# Patient Record
Sex: Female | Born: 1965
Health system: Southern US, Community
[De-identification: ages and names within clinical notes are randomized; demographics above are authoritative.]

## PROBLEM LIST (undated history)

## (undated) DIAGNOSIS — Z8679 Personal history of other diseases of the circulatory system: Secondary | ICD-10-CM

## (undated) DIAGNOSIS — Q211 Atrial septal defect: Secondary | ICD-10-CM

## (undated) DIAGNOSIS — E785 Hyperlipidemia, unspecified: Secondary | ICD-10-CM

## (undated) DIAGNOSIS — I639 Cerebral infarction, unspecified: Secondary | ICD-10-CM

## (undated) DIAGNOSIS — J45909 Unspecified asthma, uncomplicated: Secondary | ICD-10-CM

## (undated) DIAGNOSIS — K219 Gastro-esophageal reflux disease without esophagitis: Secondary | ICD-10-CM

## (undated) DIAGNOSIS — M778 Other enthesopathies, not elsewhere classified: Secondary | ICD-10-CM

## (undated) DIAGNOSIS — T4145XA Adverse effect of unspecified anesthetic, initial encounter: Secondary | ICD-10-CM

## (undated) DIAGNOSIS — H269 Unspecified cataract: Secondary | ICD-10-CM

## (undated) DIAGNOSIS — Q2112 Patent foramen ovale: Secondary | ICD-10-CM

## (undated) DIAGNOSIS — D649 Anemia, unspecified: Secondary | ICD-10-CM

## (undated) DIAGNOSIS — I10 Essential (primary) hypertension: Secondary | ICD-10-CM

## (undated) DIAGNOSIS — E119 Type 2 diabetes mellitus without complications: Secondary | ICD-10-CM

## (undated) DIAGNOSIS — T8859XA Other complications of anesthesia, initial encounter: Secondary | ICD-10-CM

## (undated) HISTORY — DX: Gastro-esophageal reflux disease without esophagitis: K21.9

## (undated) HISTORY — DX: Unspecified asthma, uncomplicated: J45.909

## (undated) HISTORY — PX: COLONOSCOPY: SHX174

## (undated) HISTORY — DX: Personal history of other diseases of the circulatory system: Z86.79

## (undated) HISTORY — DX: Essential (primary) hypertension: I10

## (undated) HISTORY — PX: UPPER GASTROINTESTINAL ENDOSCOPY: SHX188

## (undated) HISTORY — DX: Hyperlipidemia, unspecified: E78.5

## (undated) HISTORY — DX: Type 2 diabetes mellitus without complications: E11.9

## (undated) HISTORY — DX: Patent foramen ovale: Q21.12

## (undated) HISTORY — PX: OVARIAN CYST REMOVAL: SHX89

## (undated) HISTORY — DX: Anemia, unspecified: D64.9

## (undated) HISTORY — DX: Cerebral infarction, unspecified: I63.9

## (undated) HISTORY — PX: COSMETIC SURGERY: SHX468

## (undated) HISTORY — DX: Unspecified cataract: H26.9

## (undated) HISTORY — DX: Atrial septal defect: Q21.1

---

## 1989-04-11 HISTORY — PX: OVARIAN CYST REMOVAL: SHX89

## 2001-03-07 ENCOUNTER — Emergency Department (HOSPITAL_COMMUNITY): Admission: EM | Admit: 2001-03-07 | Discharge: 2001-03-07 | Payer: Self-pay | Admitting: Emergency Medicine

## 2001-03-07 ENCOUNTER — Encounter: Payer: Self-pay | Admitting: Emergency Medicine

## 2002-12-29 ENCOUNTER — Ambulatory Visit (HOSPITAL_COMMUNITY): Admission: RE | Admit: 2002-12-29 | Discharge: 2002-12-29 | Payer: Self-pay | Admitting: Family Medicine

## 2002-12-29 ENCOUNTER — Encounter: Payer: Self-pay | Admitting: Family Medicine

## 2003-01-23 ENCOUNTER — Encounter: Payer: Self-pay | Admitting: Family Medicine

## 2003-01-23 ENCOUNTER — Ambulatory Visit (HOSPITAL_COMMUNITY): Admission: RE | Admit: 2003-01-23 | Discharge: 2003-01-23 | Payer: Self-pay | Admitting: Family Medicine

## 2006-03-24 ENCOUNTER — Other Ambulatory Visit: Admission: RE | Admit: 2006-03-24 | Discharge: 2006-03-24 | Payer: Self-pay | Admitting: Family Medicine

## 2006-08-11 DIAGNOSIS — I639 Cerebral infarction, unspecified: Secondary | ICD-10-CM

## 2006-08-11 HISTORY — DX: Cerebral infarction, unspecified: I63.9

## 2006-10-22 ENCOUNTER — Encounter: Payer: Self-pay | Admitting: Internal Medicine

## 2006-10-22 ENCOUNTER — Inpatient Hospital Stay (HOSPITAL_COMMUNITY): Admission: EM | Admit: 2006-10-22 | Discharge: 2006-10-27 | Payer: Self-pay | Admitting: Emergency Medicine

## 2006-10-22 ENCOUNTER — Ambulatory Visit: Payer: Self-pay | Admitting: Cardiology

## 2006-10-23 ENCOUNTER — Encounter: Payer: Self-pay | Admitting: Cardiology

## 2006-10-26 ENCOUNTER — Ambulatory Visit: Payer: Self-pay | Admitting: Physical Medicine & Rehabilitation

## 2006-11-06 ENCOUNTER — Encounter: Admission: RE | Admit: 2006-11-06 | Discharge: 2006-12-03 | Payer: Self-pay | Admitting: Neurology

## 2007-03-15 ENCOUNTER — Emergency Department (HOSPITAL_COMMUNITY): Admission: EM | Admit: 2007-03-15 | Discharge: 2007-03-15 | Payer: Self-pay | Admitting: Emergency Medicine

## 2007-04-16 ENCOUNTER — Other Ambulatory Visit: Admission: RE | Admit: 2007-04-16 | Discharge: 2007-04-16 | Payer: Self-pay | Admitting: Family Medicine

## 2008-06-01 ENCOUNTER — Encounter: Payer: Self-pay | Admitting: Internal Medicine

## 2008-10-12 ENCOUNTER — Encounter: Payer: Self-pay | Admitting: Internal Medicine

## 2009-05-09 ENCOUNTER — Other Ambulatory Visit: Admission: RE | Admit: 2009-05-09 | Discharge: 2009-05-09 | Payer: Self-pay | Admitting: Family Medicine

## 2009-12-10 ENCOUNTER — Ambulatory Visit: Payer: Self-pay | Admitting: Internal Medicine

## 2009-12-10 DIAGNOSIS — I10 Essential (primary) hypertension: Secondary | ICD-10-CM | POA: Insufficient documentation

## 2009-12-10 DIAGNOSIS — Z8679 Personal history of other diseases of the circulatory system: Secondary | ICD-10-CM

## 2009-12-10 DIAGNOSIS — J45909 Unspecified asthma, uncomplicated: Secondary | ICD-10-CM

## 2009-12-10 DIAGNOSIS — E785 Hyperlipidemia, unspecified: Secondary | ICD-10-CM

## 2009-12-10 DIAGNOSIS — J4530 Mild persistent asthma, uncomplicated: Secondary | ICD-10-CM | POA: Insufficient documentation

## 2009-12-10 HISTORY — DX: Essential (primary) hypertension: I10

## 2009-12-10 HISTORY — DX: Hyperlipidemia, unspecified: E78.5

## 2009-12-10 HISTORY — DX: Personal history of other diseases of the circulatory system: Z86.79

## 2009-12-10 HISTORY — DX: Unspecified asthma, uncomplicated: J45.909

## 2010-01-18 ENCOUNTER — Ambulatory Visit: Payer: Self-pay | Admitting: Internal Medicine

## 2010-01-18 ENCOUNTER — Telehealth: Payer: Self-pay | Admitting: Internal Medicine

## 2010-01-18 DIAGNOSIS — L723 Sebaceous cyst: Secondary | ICD-10-CM | POA: Insufficient documentation

## 2010-01-28 ENCOUNTER — Telehealth: Payer: Self-pay

## 2010-02-04 ENCOUNTER — Ambulatory Visit: Payer: Self-pay | Admitting: Internal Medicine

## 2010-02-04 LAB — CONVERTED CEMR LAB
ALT: 36 units/L — ABNORMAL HIGH (ref 0–35)
AST: 42 units/L — ABNORMAL HIGH (ref 0–37)
Albumin: 4.1 g/dL (ref 3.5–5.2)
Alkaline Phosphatase: 61 units/L (ref 39–117)
BUN: 17 mg/dL (ref 6–23)
Basophils Absolute: 0 10*3/uL (ref 0.0–0.1)
Basophils Relative: 0.3 % (ref 0.0–3.0)
Bilirubin, Direct: 0.1 mg/dL (ref 0.0–0.3)
CO2: 25 meq/L (ref 19–32)
Calcium: 9 mg/dL (ref 8.4–10.5)
Chloride: 102 meq/L (ref 96–112)
Cholesterol: 125 mg/dL (ref 0–200)
Creatinine, Ser: 1 mg/dL (ref 0.4–1.2)
Eosinophils Absolute: 0.3 10*3/uL (ref 0.0–0.7)
Eosinophils Relative: 4.6 % (ref 0.0–5.0)
GFR calc non Af Amer: 76.45 mL/min (ref >60)
Glucose, Bld: 92 mg/dL (ref 70–99)
HCT: 34.7 % — ABNORMAL LOW (ref 36.0–46.0)
HDL: 45.8 mg/dL (ref >39.00)
Hemoglobin: 12.2 g/dL (ref 12.0–15.0)
LDL Cholesterol: 56 mg/dL (ref 0–99)
Lymphocytes Relative: 12.4 % (ref 12.0–46.0)
Lymphs Abs: 0.8 10*3/uL (ref 0.7–4.0)
MCHC: 35.2 g/dL (ref 30.0–36.0)
MCV: 90.6 fL (ref 78.0–100.0)
Monocytes Absolute: 0.9 10*3/uL (ref 0.1–1.0)
Monocytes Relative: 14.5 % — ABNORMAL HIGH (ref 3.0–12.0)
Neutro Abs: 4.2 10*3/uL (ref 1.4–7.7)
Neutrophils Relative %: 68.2 % (ref 43.0–77.0)
Platelets: 226 10*3/uL (ref 150.0–400.0)
Potassium: 4.1 meq/L (ref 3.5–5.1)
RBC: 3.83 M/uL — ABNORMAL LOW (ref 3.87–5.11)
RDW: 13.6 % (ref 11.5–14.6)
Sodium: 136 meq/L (ref 135–145)
TSH: 2.65 microintl units/mL (ref 0.35–5.50)
Total Bilirubin: 0.5 mg/dL (ref 0.3–1.2)
Total CHOL/HDL Ratio: 3
Total Protein: 7.2 g/dL (ref 6.0–8.3)
Triglycerides: 114 mg/dL (ref 0.0–149.0)
VLDL: 22.8 mg/dL (ref 0.0–40.0)
WBC: 6.2 10*3/uL (ref 4.5–10.5)

## 2010-02-05 ENCOUNTER — Encounter: Payer: Self-pay | Admitting: Internal Medicine

## 2010-02-05 ENCOUNTER — Telehealth: Payer: Self-pay | Admitting: Internal Medicine

## 2010-02-28 ENCOUNTER — Ambulatory Visit: Payer: Self-pay | Admitting: Internal Medicine

## 2010-03-04 ENCOUNTER — Ambulatory Visit: Payer: Self-pay | Admitting: Internal Medicine

## 2010-03-15 ENCOUNTER — Telehealth (INDEPENDENT_AMBULATORY_CARE_PROVIDER_SITE_OTHER): Payer: Self-pay | Admitting: *Deleted

## 2010-05-07 ENCOUNTER — Telehealth: Payer: Self-pay | Admitting: Internal Medicine

## 2010-05-09 ENCOUNTER — Ambulatory Visit: Payer: Self-pay | Admitting: Internal Medicine

## 2010-05-10 ENCOUNTER — Telehealth: Payer: Self-pay | Admitting: Internal Medicine

## 2010-08-27 ENCOUNTER — Ambulatory Visit
Admission: RE | Admit: 2010-08-27 | Discharge: 2010-08-27 | Payer: Self-pay | Source: Home / Self Care | Attending: Internal Medicine | Admitting: Internal Medicine

## 2010-08-27 ENCOUNTER — Encounter: Payer: Self-pay | Admitting: Internal Medicine

## 2010-08-27 DIAGNOSIS — J069 Acute upper respiratory infection, unspecified: Secondary | ICD-10-CM | POA: Insufficient documentation

## 2010-09-10 NOTE — Progress Notes (Signed)
Summary: Pt req another letter to be out of work on 02/06/10`  Phone Note Call from Patient Call back at [facility] Phone [phone]   Caller: Patient Summary of Call: Pt called and wants to know if drinking the nutritional suppliment Boost, would increase patients energy and appetite? Pls advise. Also pt wants to get a letter to be out of work on 02/06/10. Pt says she is still very weak. Pt will pick letter tomorrow.    Initial call taken by: Lucy Antigua,  February 05, 2010 10:39 AM  Follow-up for Phone Call        ok Follow-up by: Gordy Savers  MD,  February 05, 2010 12:53 PM

## 2010-09-10 NOTE — Assessment & Plan Note (Signed)
Summary: elevated BP    kik   Vital Signs:  Patient profile:   45 year old female Weight:      170 pounds Temp:     97.8 degrees F oral BP sitting:   128 / 80  (left arm) Cuff size:   regular  Vitals Entered By: Duard Brady LPN (May 09, 2010 11:03 AM) CC: f/u on elevated BP  Is Patient Diabetic? No Flu Vaccine Consent Questions     Do you have a history of severe allergic reactions to this vaccine? no    Any prior history of allergic reactions to egg and/or gelatin? no    Do you have a sensitivity to the preservative Thimersol? no    Do you have a past history of Guillan-Barre Syndrome? no    Do you currently have an acute febrile illness? no    Have you ever had a severe reaction to latex? no    Vaccine information given and explained to patient? yes    Are you currently pregnant? no    Lot Number:[id number]   Exp Date:02/08/2011   Site Given  Left Deltoid IM   CC:  f/u on elevated BP .  History of Present Illness: 45 year old patient who has a history of treated hypertension in the past.  Presently controlled off medication.  She has a history of a prior right brain stroke in the spring of 2008.  Home blood pressure readings have consistently been elevated over 150 systolic and over 90, diastolic.  She is on pravastatin for dyslipidemia.  She has a history of asthma, which has been stable.  Allergies: 1)  ! Pcn  Past History:  Past Medical History: Reviewed history from 12/10/2009 and no changes required. Asthma Hyperlipidemia Hypertension Cerebrovascular accident, hx of March 2008  Review of Systems  The patient denies anorexia, fever, weight loss, weight gain, vision loss, decreased hearing, hoarseness, chest pain, syncope, dyspnea on exertion, peripheral edema, prolonged cough, headaches, hemoptysis, abdominal pain, melena, hematochezia, severe indigestion/heartburn, hematuria, incontinence, genital sores, muscle weakness, suspicious skin lesions,  transient blindness, difficulty walking, depression, unusual weight change, abnormal bleeding, enlarged lymph nodes, angioedema, and breast masses.    Physical Exam  General:  Well-developed,well-nourished,in no acute distress; alert,appropriate and cooperative throughout examination; 160/94 Eyes:  No corneal or conjunctival inflammation noted. EOMI. Perrla. Funduscopic exam benign, without hemorrhages, exudates or papilledema. Vision grossly normal. Mouth:  Oral mucosa and oropharynx without lesions or exudates.  Teeth in good repair. Neck:  No deformities, masses, or tenderness noted. Lungs:  Normal respiratory effort, chest expands symmetrically. Lungs are clear to auscultation, no crackles or wheezes. Heart:  Normal rate and regular rhythm. S1 and S2 normal without gallop, murmur, click, rub or other extra sounds. Abdomen:  Bowel sounds positive,abdomen soft and non-tender without masses, organomegaly or hernias noted.   Impression & Recommendations:  Problem # 1:  HYPERTENSION (ICD-401.9)  Her updated medication list for this problem includes:    Lisinopril 20 Mg Tabs (Lisinopril) ..... One tablet daily  Problem # 2:  CEREBROVASCULAR ACCIDENT, HX OF (ICD-V12.50)  Complete Medication List: 1)  Pravastatin Sodium 20 Mg Tabs (Pravastatin sodium) .... Qd 2)  Aspir-low 81 Mg Tbec (Aspirin) .... Qd 3)  One-a-day Extras Antioxidant Caps (Multiple vitamins-minerals) .... Qd 4)  Ventolin Hfa 108 (90 Base) Mcg/act Aers (Albuterol sulfate) .... Two puffs every 6 hours and as needed for asthma 5)  Lisinopril 20 Mg Tabs (Lisinopril) .... One tablet daily  Other Orders: Admin 1st Vaccine (  87564) Flu Vaccine 11yrs + (33295)  Patient Instructions: 1)  Limit your Sodium (Salt). 2)  It is important that you exercise regularly at least 20 minutes 5 times a week. If you develop chest pain, have severe difficulty breathing, or feel very tired , stop exercising immediately and seek medical  attention. 3)  You need to lose weight. Consider a lower calorie diet and regular exercise.  4)  Check your Blood Pressure regularly. If it is above: 150/90 you should make an appointment.

## 2010-09-10 NOTE — Letter (Signed)
Summary: Date Range: 06-01-08 to 08-24-08/[facility]  Date Range: 06-01-08 to 08-24-08/[facility]   Imported By: Sherian Rein 01/25/2010 14:20:07  _____________________________________________________________________  External Attachment:    Type:   Image     Comment:   External Document

## 2010-09-10 NOTE — Assessment & Plan Note (Signed)
Summary: low bp 100/64   /cjr   Vital Signs:  Patient profile:   45 year old female Weight:      167 pounds Temp:     98.0 degrees F oral BP sitting:   112 / 70  (left arm) Cuff size:   regular  Vitals Entered By: Duard Brady LPN (March 04, 2010 9:07 AM) CC: c/o BP running loe , weak feeling .     her BP monitor - 115/75 (L) Is Patient Diabetic? No   CC:  c/o BP running loe  and weak feeling .     her BP monitor - 115/75 (L).  History of Present Illness: 45 year old patient who has a history of cerebrovascular disease, and hypertension.  Her blood pressure is presently controlled off medication.  She has been on Septra DS for axillary furunculus as which has largely resolved.  Over the weekend.  She had some weakness and was concerned about low blood pressure readings.  She does monitor  blood pressures at home  Allergies: 1)  ! Pcn  Past History:  Past Medical History: Reviewed history from 12/10/2009 and no changes required. Asthma Hyperlipidemia Hypertension Cerebrovascular accident, hx of March 2008  Physical Exam  General:  Well-developed,well-nourished,in no acute distress; alert,appropriate and cooperative throughout examination; 110/72 Head:  Normocephalic and atraumatic without obvious abnormalities. No apparent alopecia or balding. Eyes:  No corneal or conjunctival inflammation noted. EOMI. Perrla. Funduscopic exam benign, without hemorrhages, exudates or papilledema. Vision grossly normal. Ears:  External ear exam shows no significant lesions or deformities.  Otoscopic examination reveals clear canals, tympanic membranes are intact bilaterally without bulging, retraction, inflammation or discharge. Hearing is grossly normal bilaterally. Mouth:  Oral mucosa and oropharynx without lesions or exudates.  Teeth in good repair. Neck:  No deformities, masses, or tenderness noted. Lungs:  Normal respiratory effort, chest expands symmetrically. Lungs are clear to  auscultation, no crackles or wheezes. Skin:  left axilla revealed no signs of active infection; unshaved   Impression & Recommendations:  Problem # 1:  HYPERTENSION (ICD-401.9)  Problem # 2:  CEREBROVASCULAR ACCIDENT, HX OF (ICD-V12.50)  Problem # 3:  SEBACEOUS CYST, INFECTED (ICD-706.2) will discontinue antibiotic therapy  Complete Medication List: 1)  Pravastatin Sodium 20 Mg Tabs (Pravastatin sodium) .... Qd 2)  Aspir-low 81 Mg Tbec (Aspirin) .... Qd 3)  One-a-day Extras Antioxidant Caps (Multiple vitamins-minerals) .... Qd 4)  Ventolin Hfa 108 (90 Base) Mcg/act Aers (Albuterol sulfate) .... Two puffs every 6 hours and as needed for asthma 5)  Bactrim Ds 800-160 Mg Tabs (Sulfamethoxazole-trimethoprim) .... Take one tab by mouth two times a day until bottle is empty  Patient Instructions: 1)  Limit your Sodium (Salt) to less than 2 grams a day(slightly less than 1/2 a teaspoon) to prevent fluid retention, swelling, or worsening of symptoms. 2)  It is important that you exercise regularly at least 20 minutes 5 times a week. If you develop chest pain, have severe difficulty breathing, or feel very tired , stop exercising immediately and seek medical attention. 3)  Check your Blood Pressure regularly. If it is above: 150/90  you should make an appointment. 4)  Please schedule a follow-up appointment in 6 months.

## 2010-09-10 NOTE — Letter (Signed)
Summary: Out of Work  Adult nurse at [employer]  [address]   [place], [place] [address]   Phone: [phone]  Fax: [phone]    February 05, 2010   Employee:  LEWIS GRIVAS    To Whom It May Concern:   For Medical reasons, please excuse the above named employee from work for the following dates:  Start:   6/27  End:   6/30 if improved  If you need additional information, please feel free to contact our office.         Sincerely,    Gordy Savers  MD

## 2010-09-10 NOTE — Assessment & Plan Note (Signed)
Summary: check ? boil under lft arm/cjr   Vital Signs:  Patient profile:   45 year old female Weight:      164 pounds Temp:     98.5 degrees F oral BP sitting:   100 / 60  (left arm) Cuff size:   regular  Vitals Entered By: Duard Brady LPN (January 18, 2010 11:20 AM) CC: c/o (L) arm pit ??cyst Is Patient Diabetic? No   CC:  c/o (L) arm pit ??cyst.  History of Present Illness: 45 year old patient who is seen today with a painful nodule in the left axilla.  She has had some mild difficulties in the past with infected sebaceous cysts in this area.  Pain is minor and there is been no drainage or any constitutional symptoms.  She has hypertension, dyslipidemia, which have been stable  Allergies: 1)  ! Pcn  Past History:  Past Medical History: Reviewed history from 12/10/2009 and no changes required. Asthma Hyperlipidemia Hypertension Cerebrovascular accident, hx of March 2008  Review of Systems       The patient complains of suspicious skin lesions.  The patient denies anorexia, fever, weight loss, weight gain, vision loss, decreased hearing, hoarseness, chest pain, syncope, dyspnea on exertion, peripheral edema, prolonged cough, headaches, hemoptysis, abdominal pain, melena, hematochezia, severe indigestion/heartburn, hematuria, incontinence, genital sores, muscle weakness, transient blindness, difficulty walking, depression, unusual weight change, abnormal bleeding, enlarged lymph nodes, angioedema, and breast masses.    Physical Exam  General:  overweight-appearing.  low-normal blood pressureoverweight-appearing.   Skin:  1 cm, slightly tender nodule in the central aspect of the left axilla.  No fluctuance.  No drainage.  No adenopathy   Impression & Recommendations:  Problem # 1:  SEBACEOUS CYST, INFECTED (ICD-706.2) local skin care discussed.  Will treat with 7 days of antibiotic therapy  Problem # 2:  HYPERTENSION (ICD-401.9)  Her updated medication list for  this problem includes:    Lisinopril-hydrochlorothiazide 20-12.5 Mg Tabs (Lisinopril-hydrochlorothiazide) ..... Qd  Her updated medication list for this problem includes:    Lisinopril-hydrochlorothiazide 20-12.5 Mg Tabs (Lisinopril-hydrochlorothiazide) ..... Qd  Complete Medication List: 1)  Lisinopril-hydrochlorothiazide 20-12.5 Mg Tabs (Lisinopril-hydrochlorothiazide) .... Qd 2)  Pravastatin Sodium 20 Mg Tabs (Pravastatin sodium) .... Qd 3)  Diphenhydramine Hcl 25 Mg Caps (Diphenhydramine hcl) .... 2 qhs 4)  Aspir-low 81 Mg Tbec (Aspirin) .... Qd 5)  One-a-day Extras Antioxidant Caps (Multiple vitamins-minerals) .... Qd 6)  Ventolin Hfa 108 (90 Base) Mcg/act Aers (Albuterol sulfate) .... Two puffs every 6 hours and as needed for asthma 7)  Cephalexin 500 Mg Caps (Cephalexin) .... One twice daily  Patient Instructions: 1)  Take your antibiotic as prescribed until ALL of it is gone, but stop if you develop a rash or swelling and contact our office as soon as possible. Prescriptions: CEPHALEXIN 500 MG CAPS (CEPHALEXIN) one twice daily  #14 x 0   Entered and Authorized by:   Gordy Savers  MD   Signed by:   Gordy Savers  MD on 01/18/2010   Method used:   Print then Give to Patient   RxID:   [id number]

## 2010-09-10 NOTE — Progress Notes (Signed)
Summary: Pt req diff med that does not contain penicillin  Phone Note Call from Patient Call back at Home Phone [phone]   Caller: Patient Summary of Call: Pt called and said that med that was just perscribed has penicillin in it and pharmacy will not fill. Please call in diff med.   Initial call taken by: Lucy Antigua,  January 18, 2010 1:40 PM  Follow-up for Phone Call        doxycycline 100 mg  #20 one twice daily Follow-up by: Gordy Savers  MD,  January 18, 2010 2:51 PM  Additional Follow-up for Phone Call Additional follow up Details #1::        attempt to call pt to see which pharmacy to call med to. KIK Additional Follow-up by: Duard Brady LPN,  January 18, 2010 3:06 PM    Additional Follow-up for Phone Call Additional follow up Details #2::    Pt called and said that it is the [place] on [facility] [phone] Follow-up by: Lucy Antigua,  January 18, 2010 3:24 PM  New/Updated Medications: DOXYCYCLINE HYCLATE 100 MG CAPS (DOXYCYCLINE HYCLATE) 1 by mouth two times a day for 10 days Prescriptions: DOXYCYCLINE HYCLATE 100 MG CAPS (DOXYCYCLINE HYCLATE) 1 by mouth two times a day for 10 days  #20 x 0   Entered by:   Duard Brady LPN   Authorized by:   Gordy Savers  MD   Signed by:   Duard Brady LPN on 09/81/1914   Method used:   Faxed to ...       [facility]  [address] [facility]       [address]       [place], [place]  [address]       Ph: [phone]       Fax: [phone]   RxID:   [id number]  faxed to [facility] [address]   KIK

## 2010-09-10 NOTE — Progress Notes (Signed)
Summary: elevated BP  Phone Note Call from Patient   Caller: Patient Call For: Rebecca Savers  MD Summary of Call: took BP 148/103 this AM , has been going on for a few days - what to do? Initial call taken by: Duard Brady LPN,  May 07, 2010 2:21 PM  Follow-up for Phone Call        called pt bp now 158/98 . Discussed Salt restrictions, limit caffien, drink water. No headache, no dizziness, no nausea. really does not feel bad. Offered appt today - declined - appt made for thurs per pt request. Instructed that if any chest pain, SOB  to ER.   to Dr. Amador Cunas to advise if there is anything different to do at this time.  Follow-up by: Duard Brady LPN,  May 07, 2010 2:25 PM  Additional Follow-up for Phone Call Additional follow up Details #1::        agree Additional Follow-up by: Rebecca Savers  MD,  May 07, 2010 5:56 PM

## 2010-09-10 NOTE — Progress Notes (Signed)
Summary: mammo ? LMTCB 8-5  Phone Note Call from Patient Call back at [phone]   Summary of Call: [facility] sent her a letter to come for mammogram.  Got one there last year.  Everything was fine.  Irving Burton she go ahead with this?   Initial call taken by: Rudy Jew, RN,  March 15, 2010 12:21 PM  Follow-up for Phone Call        OK to have this year or wait if not high risk for another year Follow-up by: Gordy Savers  MD,  March 15, 2010 12:59 PM  Additional Follow-up for Phone Call Additional follow up Details #1::        Left message to call back on unidentified line. Rudy Jew, RN  March 15, 2010 1:14 PM     Additional Follow-up for Phone Call Additional follow up Details #2::    Pt. notified. Follow-up by: Lynann Beaver CMA,  March 19, 2010 9:46 AM

## 2010-09-10 NOTE — Letter (Signed)
Summary: Records from [facility] 2007 - 2010  Records from [facility] 2007 - 2010   Imported By: Maryln Gottron 01/02/2010 13:02:03  _____________________________________________________________________  External Attachment:    Type:   Image     Comment:   External Document

## 2010-09-10 NOTE — Assessment & Plan Note (Signed)
Summary: BRAND NEW PT/TO EST/CJR---PT [facility]SC / RS---PT [facility] (2ND TIME) // RS   Vital Signs:  Patient profile:   45 year old female Height:      64.5 inches Weight:      167 pounds BMI:     28.32 Temp:     98.2 degrees F oral BP sitting:   122 / 80  (right arm) Cuff size:   regular  Vitals Entered By: Duard Brady LPN (Dec 10, 1608 2:05 PM) CC: new to establish - hx stroke 2008, HTN, high cholesterol    needs medicaltion refills Is Patient Diabetic? No   CC:  new to establish - hx stroke 2008, HTN, and high cholesterol    needs medicaltion refills.  History of Present Illness: 45 year old patient who is seen today to establish with practice.  She was hospitalized in March of 2008 for a right brain stroke with left-sided symptoms.  She has fully recovered.  The stroke occurred several days after starting birth control pills.  She is treated hypertension and dyslipidemia and is on daily aspirin therapy.  No new concerns or complaints today  Preventive Screening-Counseling & Management  Alcohol-Tobacco     Smoking Status: never  Caffeine-Diet-Exercise     Does Patient Exercise: yes  Allergies (verified): 1)  ! Pcn  Past History:  Past Medical History: Asthma Hyperlipidemia Hypertension Cerebrovascular accident, hx of March 2008  Past Surgical History: surgery for ovarian cyst, age 70  Family History: Reviewed history and no changes required. father died in his mid 32s of a massive MI mother, age 47, history of multiple myeloma 4 brothers two sisters positive for hypertension  Also, family history of colon cancer, and prostate cancer  Social History: Reviewed history and no changes required. 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    Physical Exam  General:  well developed, well nourished, in no acute distress Head:  normocephalic and atraumatic Eyes:  PERRLA/EOM intact; fundi benign, conjunctiva and sclera clear Ears:  TM's intact and clear with normal canals and hearing Nose:  no deformity, discharge, inflammation, or lesions Mouth:  no deformity or mucous membrane lesions Neck:  no masses, thyromegaly, or abnormal cervical nodes Chest Wall:  no deformities or breast masses noted Lungs:  clear bilaterally to A & P Heart:  regular rate and rhythm, S1, S2 without murmurs, rubs, gallops, or clicks Abdomen:  bowel sounds positive; abdomen soft and non-tender without masses, organomegaly, or hernias noted Msk:  no deformity or scoliosis noted with normal posture and gait Pulses:  pulses normal in all 4 extremities Extremities:  no clubbing, cyanosis, edema, or deformity noted with normal full range of motion of all joints Neurologic:  no focal deficits, CN II-XII grossly intact with normal reflexes, coordination, muscle stregnth and tone Skin:  intact without lesions or rashes Cervical Nodes:  no significant adenopathy Axillary Nodes:  no significant adenopathy Inguinal Nodes:  no significant adenopathy Psych:  alert and cooperative; normal mood and affect; normal attention span and concentration   Impression & Recommendations:  Problem #  1:  CEREBROVASCULAR ACCIDENT, HX OF (ICD-V12.50)  Problem # 2:  HYPERTENSION (ICD-401.9)  Her updated medication list for this problem includes:    Lisinopril-hydrochlorothiazide 20-12.5 Mg Tabs  (Lisinopril-hydrochlorothiazide) ..... Qd  Problem # 3:  HYPERLIPIDEMIA (ICD-272.4)  Her updated medication list for this problem includes:    Pravastatin Sodium 20 Mg Tabs (Pravastatin sodium) ..... Qd  Problem # 4:  ASTHMA (ICD-493.90)  Her updated medication list for this problem includes:    Ventolin Hfa 108 (90 Base) Mcg/act Aers (Albuterol sulfate) .Marland Kitchen..Marland Kitchen Two puffs every 6 hours and as needed for asthma  Complete Medication List: 1)  Lisinopril-hydrochlorothiazide 20-12.5 Mg Tabs (Lisinopril-hydrochlorothiazide) .... Qd 2)  Pravastatin Sodium 20 Mg Tabs (Pravastatin sodium) .... Qd 3)  Diphenhydramine Hcl 25 Mg Caps (Diphenhydramine hcl) .... 2 qhs 4)  Aspir-low 81 Mg Tbec (Aspirin) .... Qd 5)  One-a-day Extras Antioxidant Caps (Multiple vitamins-minerals) .... Qd 6)  Ventolin Hfa 108 (90 Base) Mcg/act Aers (Albuterol sulfate) .... Two puffs every 6 hours and as needed for asthma  Patient Instructions: 1)  Please schedule a follow-up appointment in 6 months. 2)  Limit your Sodium (Salt). 3)  It is important that you exercise regularly at least 20 minutes 5 times a week. If you develop chest pain, have severe difficulty breathing, or feel very tired , stop exercising immediately and seek medical attention. 4)  You need to lose weight. Consider a lower calorie diet and regular exercise.  5)  Check your Blood Pressure regularly. If it is above: 150/90 you should make an appointment. Prescriptions: VENTOLIN HFA 108 (90 BASE) MCG/ACT AERS (ALBUTEROL SULFATE) two puffs every 6 hours and as needed for asthma  #1 x 6   Entered and Authorized by:   Gordy Savers  MD   Signed by:   Gordy Savers  MD on 12/10/2009   Method used:   Electronically to        [facility] (retail)       [address]       [place], [place]  [address]       Ph: [phone]       Fax: [phone]   RxID:   [id number] PRAVASTATIN SODIUM 20 MG TABS (PRAVASTATIN  SODIUM) qd  #90 x 6   Entered and Authorized by:   Gordy Savers  MD   Signed by:   Gordy Savers  MD on 12/10/2009   Method used:   Electronically to        [facility] (retail)       [address]       [place], [place]  [address]       Ph: [phone]       Fax: [phone]   RxID:   [id number] LISINOPRIL-HYDROCHLOROTHIAZIDE 20-12.5 MG TABS (LISINOPRIL-HYDROCHLOROTHIAZIDE) qd  #90 x 6   Entered and Authorized by:   Gordy Savers  MD   Signed by:   Gordy Savers  MD on 12/10/2009   Method used:   Electronically to        [facility] (retail)       [address]       [place], [place]  [address]       Ph: [phone]       Fax: [phone]   RxID:  [id number] VENTOLIN HFA 108 (90 BASE) MCG/ACT AERS (ALBUTEROL SULFATE) two puffs every 6 hours and as needed for asthma  #1 x 6   Entered and Authorized by:   Gordy Savers  MD   Signed by:   Gordy Savers  MD on 12/10/2009   Method used:   Print then Give to Patient   RxID:   [id number] PRAVASTATIN SODIUM 20 MG TABS (PRAVASTATIN SODIUM) qd  #90 x 6   Entered and Authorized by:   Gordy Savers  MD   Signed by:   Gordy Savers  MD on 12/10/2009   Method used:   Print then Give to Patient   RxID:   [id number] LISINOPRIL-HYDROCHLOROTHIAZIDE 20-12.5 MG TABS (LISINOPRIL-HYDROCHLOROTHIAZIDE) qd  #90 x 6   Entered and Authorized by:   Gordy Savers  MD   Signed by:   Gordy Savers  MD on 12/10/2009   Method used:   Print then Give to Patient   RxID:   [id number]

## 2010-09-10 NOTE — Letter (Signed)
Summary: [facility]   Imported By: Sherian Rein 01/25/2010 14:14:32  _____________________________________________________________________  External Attachment:    Type:   Image     Comment:   External Document

## 2010-09-10 NOTE — Assessment & Plan Note (Signed)
Summary: having chills on/off//ccm   Vital Signs:  Patient profile:   45 year old female Weight:      163 pounds Temp:     98.3 degrees F oral BP sitting:   98 / 64  (left arm) Cuff size:   regular  Vitals Entered By: Duard Brady LPN (February 04, 2010 11:22 AM) CC: cyst still there, little improvment ,  had episode of urine incont. this weekend - also was disoriented -EMS was called. Is Patient Diabetic? No   CC:  cyst still there, little improvment , and had episode of urine incont. this weekend - also was disoriented -EMS was called.Marland Kitchen  History of Present Illness: 45 year old patient who is seen today for follow up.  She has treated hypertension.  Over the weekend.  She has had some diarrhea.  EMS was called due to weakness disorientation.  She has had a single episode of incontinence.  She has been on antibiotic therapy for a skin and soft tissue infection.  She has a history of asthma, which has been stable  Allergies: 1)  ! Pcn  Past History:  Past Medical History: Reviewed history from 12/10/2009 and no changes required. Asthma Hyperlipidemia Hypertension Cerebrovascular accident, hx of March 2008  Review of Systems       The patient complains of anorexia, muscle weakness, and suspicious skin lesions.  The patient denies fever, weight loss, weight gain, vision loss, decreased hearing, hoarseness, chest pain, syncope, dyspnea on exertion, peripheral edema, prolonged cough, headaches, hemoptysis, abdominal pain, melena, hematochezia, severe indigestion/heartburn, hematuria, incontinence, genital sores, transient blindness, difficulty walking, depression, unusual weight change, abnormal bleeding, enlarged lymph nodes, angioedema, and breast masses.    Physical Exam  General:  overweight-appearing.  appears slightly weak, but alert and appropriate.  Blood pressure 98/64overweight-appearing.   Head:  Normocephalic and atraumatic without obvious abnormalities. No apparent  alopecia or balding. Eyes:  No corneal or conjunctival inflammation noted. EOMI. Perrla. Funduscopic exam benign, without hemorrhages, exudates or papilledema. Vision grossly normal. Mouth:  Oral mucosa and oropharynx without lesions or exudates.  Teeth in good repair. Neck:  No deformities, masses, or tenderness noted. Lungs:  Normal respiratory effort, chest expands symmetrically. Lungs are clear to auscultation, no crackles or wheezes. Heart:  Normal rate and regular rhythm. S1 and S2 normal without gallop, murmur, click, rub or other extra sounds. Abdomen:  Bowel sounds positive,abdomen soft and non-tender without masses, organomegaly or hernias noted. Msk:  No deformity or scoliosis noted of thoracic or lumbar spine.   Pulses:  R and L carotid,radial,femoral,dorsalis pedis and posterior tibial pulses are full and equal bilaterally Extremities:  No clubbing, cyanosis, edema, or deformity noted with normal full range of motion of all joints.     Impression & Recommendations:  Problem # 1:  SEBACEOUS CYST, INFECTED (ICD-706.2)  Problem # 2:  HYPERTENSION (ICD-401.9)  The following medications were removed from the medication list:    Lisinopril-hydrochlorothiazide 20-12.5 Mg Tabs (Lisinopril-hydrochlorothiazide) ..... Qd patient has symptomatic hypotension, will discontinue her blood pressure medication.  Force fluids and recheck her blood pressure in two weeks    The following medications were removed from the medication list:    Lisinopril-hydrochlorothiazide 20-12.5 Mg Tabs (Lisinopril-hydrochlorothiazide) ..... Qd  Complete Medication List: 1)  Pravastatin Sodium 20 Mg Tabs (Pravastatin sodium) .... Qd 2)  Aspir-low 81 Mg Tbec (Aspirin) .... Qd 3)  One-a-day Extras Antioxidant Caps (Multiple vitamins-minerals) .... Qd 4)  Ventolin Hfa 108 (90 Base) Mcg/act Aers (Albuterol sulfate) .... Two  puffs every 6 hours and as needed for asthma 5)  Bactrim Ds 800-160 Mg Tabs  (Sulfamethoxazole-trimethoprim) .... Take one tab by mouth two times a day until bottle is empty  Other Orders: Venipuncture ([address]) TLB-Lipid Panel (80061-LIPID) TLB-BMP (Basic Metabolic Panel-BMET) (80048-METABOL) TLB-CBC Platelet - w/Differential (85025-CBCD) TLB-TSH (Thyroid Stimulating Hormone) (84443-TSH) TLB-Hepatic/Liver Function Pnl (80076-HEPATIC)  Patient Instructions: 1)  Please schedule a follow-up appointment in 2 weeks. 2)  Drink as much fluid as you can tolerate for the next few days. 3)  stop the blood pressure medication

## 2010-09-10 NOTE — Letter (Signed)
Summary: Pt discharge insturcions/[facility]   Pt discharge insturcions/[facility]   Imported By: Sherian Rein 01/25/2010 14:16:56  _____________________________________________________________________  External Attachment:    Type:   Image     Comment:   External Document

## 2010-09-10 NOTE — Letter (Signed)
Summary: Out of Work  Adult nurse at [employer]  [address]   [place], [place] [address]   Phone: [phone]  Fax: [phone]    February 04, 2010   Employee:  INEZ STANTZ    To Whom It May Concern:   For Medical reasons, please excuse the above named employee from work for the following dates:  Start:   02-04-2010  End:   02-06-2010 if improved  If you need additional information, please feel free to contact our office.         Sincerely,    Gordy Savers  MD

## 2010-09-10 NOTE — Progress Notes (Signed)
Summary: Pt still has cyst and is out of antibiotic  Phone Note Call from Patient Call back at Home Phone [phone]   Caller: Patient Summary of Call: Pt called and said that cyst still hasnt gone down any and she has finished antibiotic. Pt doesn't know if she needs to come in to be re-evaluated or if more med can be called in. [facility] [phone].  Pt req call from [facility] or Dr. Amador Cunas.  Initial call taken by: Lucy Antigua,  January 28, 2010 11:49 AM  Follow-up for Phone Call        generic bactrim DS  #30 one twice daily Follow-up by: Gordy Savers  MD,  January 28, 2010 12:23 PM     Appended Document: Pt still has cyst and is out of antibiotic    Clinical Lists Changes  Medications: Added new medication of BACTRIM DS 800-160 MG TABS (SULFAMETHOXAZOLE-TRIMETHOPRIM) take one tab by mouth two times a day until bottle is empty - Signed Rx of BACTRIM DS 800-160 MG TABS (SULFAMETHOXAZOLE-TRIMETHOPRIM) take one tab by mouth two times a day until bottle is empty;  #30 x 0;  Signed;  Entered by: Kern Reap CMA (AAMA);  Authorized by: Gordy Savers  MD;  Method used: Electronically to [facility] [address], [place], [place], [place]  [address], Ph: [phone], Fax: [phone]    Prescriptions: BACTRIM DS 800-160 MG TABS (SULFAMETHOXAZOLE-TRIMETHOPRIM) take one tab by mouth two times a day until bottle is empty  #30 x 0   Entered by:   Kern Reap CMA (AAMA)   Authorized by:   Gordy Savers  MD   Signed by:   Kern Reap CMA (AAMA) on 01/28/2010   Method used:   Electronically to        [facility] (retail)       [address]       [place], [place]  [address]       Ph: [phone]       Fax: [phone]   RxID:   [id number]    Appended Document: Pt still has cyst and is out of antibiotic patient is aware

## 2010-09-10 NOTE — Progress Notes (Signed)
Summary: BP med to start?  Phone Note Call from Patient   Caller: Patient Call For: Gordy Savers  MD Summary of Call: at appt yesterday , pt thought she was to start on BP meds - checked with pharmacy - nothing called in .  please call [phone] Initial call taken by: Duard Brady LPN,  May 10, 2010 12:15 PM    Prescriptions: LISINOPRIL 20 MG TABS (LISINOPRIL) one tablet daily  #90 x 6   Entered and Authorized by:   Gordy Savers  MD   Signed by:   Gordy Savers  MD on 05/10/2010   Method used:   Electronically to        [facility] (retail)       [address]       [place], [place]  [address]       Ph: [phone]       Fax: [phone]   RxID:   [id number]

## 2010-09-10 NOTE — Assessment & Plan Note (Signed)
Summary: 2 wk rov/njr/pt rsc/cjr   Vital Signs:  Patient profile:   45 year old female Weight:      162 pounds Temp:     98.0 degrees F oral BP sitting:   120 / 78  (right arm) Cuff size:   regular  Vitals Entered By: Duard Brady LPN (February 28, 2010 11:02 AM) CC: 2 wk rov - doing ok , still has cyst under (L) arm Is Patient Diabetic? No   CC:  2 wk rov - doing ok  and still has cyst under (L) arm.  History of Present Illness: 45 year old patient who is seen today for follow-up of her hypertension.  Blood pressure medications.  Has been on hold since an episode of volume  depletion and symptomatic hypotension.  She feels well.  She continues to monitor home blood pressure readings with only occasional elevated readings.  She feels well.  She has a history of asthma, which has been stable, as well as dyslipidemia  Allergies: 1)  ! Pcn  Past History:  Past Medical History: Reviewed history from 12/10/2009 and no changes required. Asthma Hyperlipidemia Hypertension Cerebrovascular accident, hx of March 2008  Review of Systems  The patient denies anorexia, fever, weight loss, weight gain, vision loss, decreased hearing, hoarseness, chest pain, syncope, dyspnea on exertion, peripheral edema, prolonged cough, headaches, hemoptysis, abdominal pain, melena, hematochezia, severe indigestion/heartburn, hematuria, incontinence, genital sores, muscle weakness, suspicious skin lesions, transient blindness, difficulty walking, depression, unusual weight change, abnormal bleeding, enlarged lymph nodes, angioedema, and breast masses.    Physical Exam  General:  Well-developed,well-nourished,in no acute distress; alert,appropriate and cooperative throughout examination; 124/82 in Mouth:  Oral mucosa and oropharynx without lesions or exudates.  Teeth in good repair. Neck:  No deformities, masses, or tenderness noted. Lungs:  Normal respiratory effort, chest expands symmetrically. Lungs  are clear to auscultation, no crackles or wheezes. Heart:  Normal rate and regular rhythm. S1 and S2 normal without gallop, murmur, click, rub or other extra sounds. Abdomen:  Bowel sounds positive,abdomen soft and non-tender without masses, organomegaly or hernias noted.   Impression & Recommendations:  Problem # 1:  HYPERTENSION (ICD-401.9) will continue to observe off blood pressure medication and continued home close blood pressure monitoring. If  blood pressures consistently above 140/90.  Will resume treatment  Problem # 2:  ASTHMA (ICD-493.90)  Her updated medication list for this problem includes:    Ventolin Hfa 108 (90 Base) Mcg/act Aers (Albuterol sulfate) .Marland Kitchen..Marland Kitchen Two puffs every 6 hours and as needed for asthma  Complete Medication List: 1)  Pravastatin Sodium 20 Mg Tabs (Pravastatin sodium) .... Qd 2)  Aspir-low 81 Mg Tbec (Aspirin) .... Qd 3)  One-a-day Extras Antioxidant Caps (Multiple vitamins-minerals) .... Qd 4)  Ventolin Hfa 108 (90 Base) Mcg/act Aers (Albuterol sulfate) .... Two puffs every 6 hours and as needed for asthma 5)  Bactrim Ds 800-160 Mg Tabs (Sulfamethoxazole-trimethoprim) .... Take one tab by mouth two times a day until bottle is empty  Patient Instructions: 1)  Limit your Sodium (Salt) to less than 2 grams a day(slightly less than 1/2 a teaspoon) to prevent fluid retention, swelling, or worsening of symptoms. 2)  It is important that you exercise regularly at least 20 minutes 5 times a week. If you develop chest pain, have severe difficulty breathing, or feel very tired , stop exercising immediately and seek medical attention. 3)  Check your Blood Pressure regularly. If it is above: 140/90  you should make an appointment. 4)  Please schedule a follow-up appointment in 4 months.

## 2010-09-12 NOTE — Assessment & Plan Note (Signed)
Summary: cough/head and chest congestion/cjr/pt rescd//ccm   Vital Signs:  Patient profile:   45 year old female Weight:      166 pounds Temp:     98.0 degrees F oral BP sitting:   122 / 80  (right arm) Cuff size:   regular  Vitals Entered By: Duard Brady LPN (August 27, 2010 10:15 AM) CC: c/o head and chest congestion , non productive cough   otc not helping Is Patient Diabetic? No   CC:  c/o head and chest congestion  and non productive cough   otc not helping.  History of Present Illness: 45  year old patient who has a history of asthma and presents with a4-day history of head and chest congestion.  Her primary symptom is chronic, nonproductive cough.  There has been no wheezing, but she has taken her Ventolin MDI, prophylactically, occasionally.  She has been using Alka-Seltzer, cough and cold Delsym and Mucinex DM.  There is been no fever or purulent sputum production.  She does have a history of dyslipidemia and hypertension  Allergies: 1)  ! Pcn  Past History:  Past Medical History: Reviewed history from 12/10/2009 and no changes required. Asthma Hyperlipidemia Hypertension Cerebrovascular accident, hx of March 2008  Review of Systems       The patient complains of anorexia and prolonged cough.  The patient denies fever, weight loss, weight gain, vision loss, decreased hearing, hoarseness, chest pain, syncope, dyspnea on exertion, peripheral edema, headaches, hemoptysis, abdominal pain, melena, hematochezia, severe indigestion/heartburn, hematuria, incontinence, genital sores, muscle weakness, suspicious skin lesions, transient blindness, difficulty walking, depression, unusual weight change, abnormal bleeding, enlarged lymph nodes, angioedema, and breast masses.    Physical Exam  General:  Well-developed,well-nourished,in no acute distress; alert,appropriate and cooperative throughout examination Head:  Normocephalic and atraumatic without obvious  abnormalities. No apparent alopecia or balding. Eyes:  No corneal or conjunctival inflammation noted. EOMI. Perrla. Funduscopic exam benign, without hemorrhages, exudates or papilledema. Vision grossly normal. Ears:  External ear exam shows no significant lesions or deformities.  Otoscopic examination reveals clear canals, tympanic membranes are intact bilaterally without bulging, retraction, inflammation or discharge. Hearing is grossly normal bilaterally. Nose:  External nasal examination shows no deformity or inflammation. Nasal mucosa are pink and moist without lesions or exudates. Mouth:  Oral mucosa and oropharynx without lesions or exudates.  Teeth in good repair. Neck:  No deformities, masses, or tenderness noted. Lungs:  Normal respiratory effort, chest expands symmetrically. Lungs are clear to auscultation, no crackles or wheezes. Heart:  Normal rate and regular rhythm. S1 and S2 normal without gallop, murmur, click, rub or other extra sounds.   Impression & Recommendations:  Problem # 1:  URI (ICD-465.9)  Her updated medication list for this problem includes:    Aspir-low 81 Mg Tbec (Aspirin) ..... Qd    Hydrocodone-homatropine 5-1.5 Mg/50ml Syrp (Hydrocodone-homatropine) .Marland Kitchen... 1 teaspoon every 6 hours as needed for cough  Her updated medication list for this problem includes:    Aspir-low 81 Mg Tbec (Aspirin) ..... Qd    Hydrocodone-homatropine 5-1.5 Mg/40ml Syrp (Hydrocodone-homatropine) .Marland Kitchen... 1 teaspoon every 6 hours as needed for cough  Problem # 2:  HYPERTENSION (ICD-401.9)  Her updated medication list for this problem includes:    Lisinopril 20 Mg Tabs (Lisinopril) ..... One tablet daily  Her updated medication list for this problem includes:    Lisinopril 20 Mg Tabs (Lisinopril) ..... One tablet daily  Complete Medication List: 1)  Pravastatin Sodium 20 Mg Tabs (Pravastatin sodium) .Marland KitchenMarland KitchenMarland Kitchen  Qd 2)  Aspir-low 81 Mg Tbec (Aspirin) .... Qd 3)  One-a-day Extras Antioxidant  Caps (Multiple vitamins-minerals) .... Qd 4)  Ventolin Hfa 108 (90 Base) Mcg/act Aers (Albuterol sulfate) .... Two puffs every 6 hours and as needed for asthma 5)  Lisinopril 20 Mg Tabs (Lisinopril) .... One tablet daily 6)  Hydrocodone-homatropine 5-1.5 Mg/28ml Syrp (Hydrocodone-homatropine) .Marland Kitchen.. 1 teaspoon every 6 hours as needed for cough  Patient Instructions: 1)  Please schedule a follow-up appointment in 6 months. 2)  Limit your Sodium (Salt). 3)  Get plenty of rest, drink lots of clear liquids, and use Tylenol or Ibuprofen for fever and comfort. Return in 7-10 days if you're not better:sooner if you're feeling worse. Prescriptions: HYDROCODONE-HOMATROPINE 5-1.5 MG/5ML SYRP (HYDROCODONE-HOMATROPINE) 1 teaspoon every 6 hours as needed for cough  #6 oz x 0   Entered and Authorized by:   Gordy Savers  MD   Signed by:   Gordy Savers  MD on 08/27/2010   Method used:   Print then Give to Patient   RxID:   [id number]    Orders Added: 1)  Est. Patient Level III [30865]

## 2010-09-12 NOTE — Letter (Signed)
Summary: Out of Work  Adult nurse at [employer]  [address]   [place], [place] [address]   Phone: [phone]  Fax: [phone]    August 27, 2010   Employee:  ANNISHA BAAR    To Whom It May Concern:   For Medical reasons, please excuse the above named employee from work for the following dates:  Start:   08-27-10  End:   08-28-10   If you need additional information, please feel free to contact our office.         Sincerely,    Gordy Savers  MD

## 2010-12-27 NOTE — H&P (Signed)
NAMECASS, EDINGER NO.:  [id number]   MEDICAL RECORD NO.:  [id number]          PATIENT TYPE:  INP   LOCATION:  3005                         FACILITY:  [facility]   PHYSICIAN:  Deanna Artis. Hickling, M.D.DATE OF BIRTH:  [birth year]   DATE OF ADMISSION:  10/22/2006  DATE OF DISCHARGE:                              HISTORY & PHYSICAL   CHIEF COMPLAINT:  Left-sided weakness and garbled speech.   HISTORY OF PRESENT ILLNESS:  A 45 year old, right-handed, African-  American woman with history of developmental delay and slurred speech,  fully independent in her life with a history of gastroenteritis with  diarrhea yesterday.  The patient was seen by Dr. Abigail Miyamoto and sent home  with recommendations to rest and drink plenty of fluids.  The patient  went to bed at 10:30 and was not seen again until 3:10 p.m..  The  patient says she became weak around 11 a.m. and tried to call her  sister, but was unsuccessful and kept hanging.  This created a clicking  sound on the phone which her sister remembers at between 11 and 11:30.  She was not aware of its significant until she returned home.  When she  arrived at home, the patient had severe garbled speech and left  hemiparesis involving her arm and leg.  She was unable to walk.   The patient arrived by ambulance at [facility] is 1554 hours.  The ER  doctor was unable to obtain the history noted above and believed the  last time known normal was 10:30 p.m.  Therefore, code Stroke was not  called.  CT scan of the brain was delayed until 1751 hours and was  normal.  MRI of the brain was ordered and carried out around 1939 hours.  It showed evidence of central stroke involving cortical and subcortical  and basal ganglia structures.  MRA showed a cut off of the superior  branch of the right middle cerebral artery.  I happened to be and the MR  scanner, seeing another patient, when the technologist made me aware of  this.  I went to see her  at around 2015 hours.  NIH stroke scale at that  time was 5, modified rank and premorbid 0.   PAST MEDICAL HISTORY:  1. Hypertension.  2. Asthma.  3. Gastroesophageal reflux disease.  4. Dysmenorrhea.   PAST SURGICAL HISTORY:  None.   BIRTH HISTORY:  Normal pregnancy, labor and delivery, 5 pound 11 ounce  infant born to a G7, P6, full term.  The child was delayed in  kindergarten and has a certificate from [place].   SOCIAL HISTORY:  The patient lives with her sister.  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  She is  independent in her activities of daily living.  She does not use  tobacco.  She rarely drinks alcohol.  She denies use of drugs.   FAMILY HISTORY:  Positive for stroke in maternal grandmother in her mid  37s.  No other pertinent family history.   MEDICATIONS:  Lisinopril, Singulair, Protonix, oral contraceptives  (  doses are unknown).   ALLERGIES:  PENICILLIN (hives).   PHYSICAL EXAMINATION:  VITAL SIGNS:  Blood pressure 104/67 resting pulse  84, respirations 14, temperature 97.6, oxygen saturation 100%.  HEENT:  No bruits, meningismus.  LUNGS:  Clear.  HEART:  No murmurs.  Pulses normal.  ABDOMEN:  Soft.  Bowel sounds normal.  Protuberant abdomen.  No  hepatosplenomegaly.  EXTREMITIES:  Normal.  NEUROLOGIC:  Mental status:  The patient's speech was garbled,  intelligible at times.  The patient names objects, repeats phrases,  follows commands.  Cranial nerves with round reactive pupils.  Visual  fields full.  Extraocular movements full.  Left central VIII.  Midline  tongue and uvula.  Air conduction greater than bone conduction.  Motor  examination normal strength on the right with no drift and good fine  motor movements on the left with 4/5 deltoid, 4+ biceps and triceps,  wrist extensor 3, wrist flexor 4, grip 4+, fine motor movements clumsy,  hip flexor 4, psoas 4+, knee flexor 4, knee extensor 4+, foot  dorsiflexor 4,  plantar flexor 5.  Sensory:  No hemisensory, good  stereognosis bilaterally.  Cerebellar finger-to-nose and heel-knee-shin  were normal on the right.  Normal for her weakness on the left with heel-  knee-shin.  Could not be tested on the left arm.  Gait was not tested.  Deep tendon reflexes were diminished.  The patient had bilateral flexor  plantar responses.   IMPRESSION:  On the basis of the study, the patient has a branch  occlusion of the superior branch of the right middle cerebral artery  likely an embolus, but the source is unclear.  This is a nonhemorrhagic  infarction. 434.10   PLAN:  The patient is beyond the TPA 3-hour window.  At presentation,  she was at 5 hours.  I would not have had her go to interventional  radiology due to the  time of presentation, and the presence of the  branch occlusion.  I did not become aware of this patient until 9 hours  into her stroke.  She is not a candidate for any neurologic protective  study at this time because of low NIH stroke scale.  She could worsen.  We will watch her very closely.  She passed her swallowing screen.  She  will be admitted for workup for treatable causes of a stroke in the  young. She will be treated with aspirin.      Deanna Artis. Sharene Skeans, M.D.  Electronically Signed     [facility]/MED[facility]  D:  10/22/2006  T:  10/24/2006  Job:  [id number]   cc:   Chales Salmon. Abigail Miyamoto, M.D.

## 2010-12-27 NOTE — Discharge Summary (Signed)
NAMEAAIRAH, NEGRETTE NO.:  [id number]   MEDICAL RECORD NO.:  [id number]          PATIENT TYPE:  INP   LOCATION:  3005                         FACILITY:  [facility]   PHYSICIAN:  Melvyn Novas, M.D.  DATE OF BIRTH:  Dec 06, 1965   DATE OF ADMISSION:  10/22/2006  DATE OF DISCHARGE:  10/25/2006                         DISCHARGE SUMMARY - REFERRING   HISTORY OF PRESENT ILLNESS:  Mrs. Rebecca Yoder was admitted on October 22, 2006 to the [facility] Stroke MD Service. She is a 45 -year-  old African-American right handed lady, who had noted left sided  weakness and garbled speech. She was found by her sister, with whom she  lives at home. Her sister had been out for a night shift and returned  from work, finding Mrs. Brimley on the floor. Mrs. Feehan had tried to  reach the telephone but was unsuccessful. She was unable to walk and  could only crawl. The patient arrived by ambulance at [facility] at close to 16 hours. A code stroke was not called. Please see  details of history and physical. The [employer] [facility] [employer] Stroke  Scale at the time was 5, modified Rankin Disability Scale was zero.   PAST MEDICAL HISTORY:  Hypertension, asthma, gastroesophageal reflux  disease, and dysmenorrhea.   PAST SURGICAL HISTORY:  None.   OB/GYN HISTORY:  The patient was the product of a normal labor, born at  5 pounds and 11 ounces. She was delayed in kindergarten and has a high  school certificate from [place].   HOSPITAL COURSE:  The patient was placed on Lovenox. She had a MRI that  showed a branch occlusion of the right MCA with a question of an embolic  source, given the use of her patient and her in general, good vascular  status. She passed a swallowing screen and was able to take p.o.  medications. She was not a candidate for any NEURO protective studies  because of the elapsed time before she presented to ER.   The patient was undergoing a 2-D  echocardiogram, which shows no source  of embolism.  She remained impaired with left sided hemiparesis at 3 out of 5 for the  deltoid, 4+ out of 5 of the biceps and triceps, and very clumsy hand  movements. Hip flexion, hip abduction, and adduction were in the 4 out  of 5 range.  The patient is able to walk with assistance. She can use a bedside  commode.   CONCLUSION:  The patient remains with a rather mild hemiparesis.  She is supposed to be discharged to the inpatient rehabilitation  services at [facility] today. She remains on ASA 325 mg per  day.      Melvyn Novas, M.D.  Electronically Signed     CD/MEDQ  D:  10/25/2006  T:  10/25/2006  Job:  [id number]   cc:   Chales Salmon. Abigail Miyamoto, M.D.

## 2010-12-27 NOTE — Discharge Summary (Signed)
[name], KERNODLE NO.:  [id number]   MEDICAL RECORD NO.:  [id number]          PATIENT TYPE:  INP   LOCATION:  3005                         FACILITY:  [facility]   PHYSICIAN:  Annie Main, N.P.      DATE OF BIRTH:  [birth year]   DATE OF ADMISSION:  10/22/2006  DATE OF DISCHARGE:                               DISCHARGE SUMMARY   ADDENDUM:  Transesophageal echocardiogram was performed on [name] to rule out embolic source.  She does have a very small PFO  without right to left shunt.  It is unlikely to be the etiology of her  infarct.  Etiology overall is unclear, though it is highly coincidental  that she started birth control pills and had a total of 9 days of birth  control pills prior to the infarct.  I have asked the patient to stop  birth control pills at the time of discharge.  I placed her on aspirin  325 mg for secondary stroke prevention.  The patient is too high level  for inpatient rehabilitation and has to be arranged for outpatient PT  and OT at the time of discharge.   She will follow up with Dr. Vickey Huger in 2-3 months and will get an  outpatient bubble study with Dr. Pearlean Brownie in the meanwhile.  She has been  advised that she can pick up a handicap placard at Dr. Bethann Goo office  prior to her next appointment.  Hemoglobin electrophoresis was performed  to evaluate for sickle cell traits.  This is pending at the time of  discharge.  A CBG was done pre and post-prandial to evaluate for  diabetes, and they were both normal at less than 120.      Annie Main, N.P.     SB/MEDQ  D:  10/27/2006  T:  10/27/2006  Job:  [id number]   cc:   Chales Salmon. Abigail Miyamoto, M.D.  Melvyn Novas, M.D.

## 2011-01-11 ENCOUNTER — Other Ambulatory Visit: Payer: Self-pay | Admitting: Internal Medicine

## 2011-02-28 ENCOUNTER — Other Ambulatory Visit (INDEPENDENT_AMBULATORY_CARE_PROVIDER_SITE_OTHER): Payer: 59

## 2011-02-28 DIAGNOSIS — Z0000 Encounter for general adult medical examination without abnormal findings: Secondary | ICD-10-CM

## 2011-02-28 LAB — HEPATIC FUNCTION PANEL
ALT: 24 U/L (ref 0–35)
AST: 24 U/L (ref 0–37)
Alkaline Phosphatase: 48 U/L (ref 39–117)
Bilirubin, Direct: 0.1 mg/dL (ref 0.0–0.3)
Total Bilirubin: 0.3 mg/dL (ref 0.3–1.2)

## 2011-02-28 LAB — CBC WITH DIFFERENTIAL/PLATELET
Basophils Absolute: 0 10*3/uL (ref 0.0–0.1)
Eosinophils Relative: 3.3 % (ref 0.0–5.0)
HCT: 33.4 % — ABNORMAL LOW (ref 36.0–46.0)
Lymphocytes Relative: 53.7 % — ABNORMAL HIGH (ref 12.0–46.0)
Lymphs Abs: 2.5 10*3/uL (ref 0.7–4.0)
Monocytes Relative: 11 % (ref 3.0–12.0)
Neutrophils Relative %: 31.7 % — ABNORMAL LOW (ref 43.0–77.0)
Platelets: 335 10*3/uL (ref 150.0–400.0)
WBC: 4.6 10*3/uL (ref 4.5–10.5)

## 2011-02-28 LAB — POCT URINALYSIS DIPSTICK
Glucose, UA: NEGATIVE
Nitrite, UA: NEGATIVE
Protein, UA: NEGATIVE
Urobilinogen, UA: 0.2

## 2011-02-28 LAB — LIPID PANEL
LDL Cholesterol: 56 mg/dL (ref 0–99)
Total CHOL/HDL Ratio: 2
VLDL: 17.6 mg/dL (ref 0.0–40.0)

## 2011-02-28 LAB — BASIC METABOLIC PANEL
BUN: 12 mg/dL (ref 6–23)
Calcium: 9.8 mg/dL (ref 8.4–10.5)
GFR: 113.7 mL/min (ref >60.00)
Potassium: 4.3 mEq/L (ref 3.5–5.1)
Sodium: 141 mEq/L (ref 135–145)

## 2011-02-28 LAB — TSH: TSH: 2.77 u[IU]/mL (ref 0.35–5.50)

## 2011-03-01 LAB — HIV ANTIBODY (ROUTINE TESTING W REFLEX): HIV: NONREACTIVE

## 2011-03-07 ENCOUNTER — Encounter: Payer: Self-pay | Admitting: Internal Medicine

## 2011-03-31 ENCOUNTER — Encounter: Payer: Self-pay | Admitting: Internal Medicine

## 2011-04-11 ENCOUNTER — Encounter: Payer: Self-pay | Admitting: Internal Medicine

## 2011-05-07 ENCOUNTER — Encounter: Payer: Self-pay | Admitting: Internal Medicine

## 2011-05-12 ENCOUNTER — Ambulatory Visit (INDEPENDENT_AMBULATORY_CARE_PROVIDER_SITE_OTHER): Payer: 59 | Admitting: Internal Medicine

## 2011-05-12 ENCOUNTER — Encounter: Payer: Self-pay | Admitting: Internal Medicine

## 2011-05-12 ENCOUNTER — Other Ambulatory Visit (HOSPITAL_COMMUNITY)
Admission: RE | Admit: 2011-05-12 | Discharge: 2011-05-12 | Disposition: A | Discharge status: Home / Self Care | Payer: 59 | Source: Ambulatory Visit | Attending: Internal Medicine | Admitting: Internal Medicine

## 2011-05-12 VITALS — BP 120/80 | HR 70 | Temp 98.3°F | Resp 18 | Ht 64.75 in | Wt 175.0 lb

## 2011-05-12 DIAGNOSIS — Z0000 Encounter for general adult medical examination without abnormal findings: Secondary | ICD-10-CM

## 2011-05-12 DIAGNOSIS — Z01419 Encounter for gynecological examination (general) (routine) without abnormal findings: Secondary | ICD-10-CM | POA: Insufficient documentation

## 2011-05-12 DIAGNOSIS — Z23 Encounter for immunization: Secondary | ICD-10-CM

## 2011-05-12 NOTE — Patient Instructions (Signed)
Limit your sodium (Salt) intake  Return in 6 months for follow-up  Please check your blood pressure on a regular basis.  If it is consistently greater than 150/90, please make an office appointment.  

## 2011-05-12 NOTE — Progress Notes (Signed)
  Subjective:    Patient ID: Rebecca Yoder, female    DOB: 05/01/1966, 45 y.o.   MRN: [id number]  HPI  45 year old patient who is in today for a health maintenance examination  Preventive Screening-Counseling & Management  Alcohol-Tobacco  Smoking Status: never  Caffeine-Diet-Exercise  Does Patient Exercise: yes   Allergies (verified):  1) ! Pcn   Past History:  Past Medical History:  Asthma  Hyperlipidemia  Hypertension  Cerebrovascular accident, hx of March 2008   Past Surgical History:  surgery for ovarian cyst, age 58   Family History:  Reviewed history and no changes required.  father died in his mid 67s of a massive MI  mother, age 43, history of multiple myeloma  4 brothers two sisters positive for hypertension  Also, family history of colon cancer, and prostate cancer   Social History:  Reviewed history and no changes required.  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  HENT: Negative for hearing loss, ear pain, nosebleeds, congestion, sore throat, mouth sores, trouble swallowing, neck stiffness, dental problem, voice change, sinus pressure and tinnitus.   Eyes: Negative for photophobia, pain, redness and visual disturbance.  Respiratory: Negative for cough, chest tightness and shortness of breath.   Cardiovascular: Negative for chest pain, palpitations and leg swelling.  Gastrointestinal: Negative for nausea, vomiting, abdominal pain, diarrhea, constipation, blood in stool, abdominal distention and rectal pain.  Genitourinary: Negative for dysuria, urgency, frequency, hematuria, flank pain, vaginal bleeding, vaginal discharge, difficulty urinating, genital sores, vaginal pain, menstrual problem and pelvic pain.  Musculoskeletal: Negative for back pain and  arthralgias.  Skin: Negative for rash.  Neurological: Negative for dizziness, syncope, speech difficulty, weakness, light-headedness, numbness and headaches.  Hematological: Negative for adenopathy. Does not bruise/bleed easily.  Psychiatric/Behavioral: Negative for suicidal ideas, behavioral problems, self-injury, dysphoric mood and agitation. The patient is not nervous/anxious.        Objective:   Physical Exam  Constitutional: She is oriented to person, place, and time. She appears well-developed and well-nourished.  HENT:  Head: Normocephalic and atraumatic.  Right Ear: External ear normal.  Left Ear: External ear normal.  Mouth/Throat: Oropharynx is clear and moist.  Eyes: Conjunctivae and EOM are normal.  Neck: Normal range of motion. Neck supple. No JVD present. No thyromegaly present.  Cardiovascular: Normal rate, regular rhythm, normal heart sounds and intact distal pulses.   No murmur heard. Pulmonary/Chest: Effort normal and breath sounds normal. She has no wheezes. She has no rales.  Abdominal: Soft. Bowel sounds are normal. She exhibits no distension and no mass. There is no tenderness. There is no rebound and no guarding.  Genitourinary: Vagina normal.  Musculoskeletal: Normal range of motion. She exhibits no edema and no tenderness.  Neurological: She is alert and oriented to person, place, and time. She has normal reflexes. No cranial nerve deficit. She exhibits normal muscle tone. Coordination normal.  Skin: Skin is warm and dry. No rash noted.  Psychiatric: She has a normal mood and affect. Her behavior is normal.          Assessment & Plan:    Preventive health examination Status post remote stroke in the setting of BCP use. No residual Hypertension well controlled Dyslipidemia well controlled  Recheck in 6 months for followup Medical regimen unchanged

## 2011-05-15 NOTE — Progress Notes (Signed)
Quick Note:  spke with pt - informed lab normal ______

## 2011-05-26 LAB — DIFFERENTIAL
Basophils Absolute: 0
Eosinophils Absolute: 0.2
Lymphs Abs: 1.9
Neutrophils Relative %: 52

## 2011-05-26 LAB — CBC
Hemoglobin: 12.8
MCHC: 33.3
MCV: 90.4
RBC: 4.26

## 2011-05-26 LAB — URINE MICROSCOPIC-ADD ON

## 2011-05-26 LAB — COMPREHENSIVE METABOLIC PANEL
ALT: 19
CO2: 25
Calcium: 9
Creatinine, Ser: 1.19
GFR calc non Af Amer: 51 — ABNORMAL LOW
Glucose, Bld: 110 — ABNORMAL HIGH
Sodium: 137

## 2011-05-26 LAB — URINALYSIS, ROUTINE W REFLEX MICROSCOPIC
Bilirubin Urine: NEGATIVE
Glucose, UA: NEGATIVE
Hgb urine dipstick: NEGATIVE
Specific Gravity, Urine: 1.017
pH: 6

## 2011-06-10 ENCOUNTER — Other Ambulatory Visit: Payer: Self-pay | Admitting: Internal Medicine

## 2011-08-01 ENCOUNTER — Other Ambulatory Visit: Payer: Self-pay | Admitting: Internal Medicine

## 2011-08-28 ENCOUNTER — Other Ambulatory Visit: Payer: Self-pay | Admitting: Internal Medicine

## 2011-11-10 ENCOUNTER — Ambulatory Visit: Payer: 59 | Admitting: Internal Medicine

## 2011-11-10 ENCOUNTER — Telehealth: Payer: Self-pay | Admitting: Family Medicine

## 2011-11-10 NOTE — Telephone Encounter (Signed)
confidential Office Message [address] [place], [place] [address] p. [phone] f. [phone] To: [facility] Fax: [phone] From: [facility] Date/ Time: 11/07/2011 1:26 PM Taken By: Crissie Figures, CSR Caller: Cala Bradford Facility: not collected Patient: Rebecca Yoder, Rebecca Yoder DOB: [birth year] Phone: [phone] Reason for Call: See info below Regarding Appointment: Yes Appt Date: 11/10/2011 Appt Time: 11:30:00 AM Provider: Other Reason: Details: not given Outcome: Instructed patient to call back tomorrow.

## 2012-05-17 ENCOUNTER — Other Ambulatory Visit: Payer: 59

## 2012-05-24 ENCOUNTER — Encounter: Payer: 59 | Admitting: Internal Medicine

## 2012-06-07 ENCOUNTER — Other Ambulatory Visit (INDEPENDENT_AMBULATORY_CARE_PROVIDER_SITE_OTHER): Payer: 59

## 2012-06-07 DIAGNOSIS — Z0000 Encounter for general adult medical examination without abnormal findings: Secondary | ICD-10-CM

## 2012-06-07 LAB — LIPID PANEL
HDL: 43.1 mg/dL (ref >39.00)
LDL Cholesterol: 74 mg/dL (ref 0–99)
Total CHOL/HDL Ratio: 3
VLDL: 25.8 mg/dL (ref 0.0–40.0)

## 2012-06-07 LAB — HEPATIC FUNCTION PANEL
ALT: 21 U/L (ref 0–35)
AST: 19 U/L (ref 0–37)
Total Bilirubin: 0.4 mg/dL (ref 0.3–1.2)

## 2012-06-07 LAB — CBC WITH DIFFERENTIAL/PLATELET
Eosinophils Relative: 3.1 % (ref 0.0–5.0)
HCT: 35.9 % — ABNORMAL LOW (ref 36.0–46.0)
Hemoglobin: 11.7 g/dL — ABNORMAL LOW (ref 12.0–15.0)
Lymphs Abs: 2.5 10*3/uL (ref 0.7–4.0)
Monocytes Relative: 11.2 % (ref 3.0–12.0)
Platelets: 406 10*3/uL — ABNORMAL HIGH (ref 150.0–400.0)
WBC: 5.6 10*3/uL (ref 4.5–10.5)

## 2012-06-07 LAB — BASIC METABOLIC PANEL
BUN: 16 mg/dL (ref 6–23)
GFR: 114.87 mL/min (ref >60.00)
Potassium: 4.3 mEq/L (ref 3.5–5.1)
Sodium: 138 mEq/L (ref 135–145)

## 2012-06-07 LAB — POCT URINALYSIS DIPSTICK
Bilirubin, UA: NEGATIVE
Blood, UA: NEGATIVE
Ketones, UA: NEGATIVE
Nitrite, UA: NEGATIVE
Spec Grav, UA: 1.025
pH, UA: 5

## 2012-06-07 LAB — TSH: TSH: 3.47 u[IU]/mL (ref 0.35–5.50)

## 2012-06-08 LAB — HIV ANTIBODY (ROUTINE TESTING W REFLEX): HIV: NONREACTIVE

## 2012-06-14 ENCOUNTER — Encounter: Payer: Self-pay | Admitting: Internal Medicine

## 2012-06-14 ENCOUNTER — Ambulatory Visit (INDEPENDENT_AMBULATORY_CARE_PROVIDER_SITE_OTHER): Payer: 59 | Admitting: Internal Medicine

## 2012-06-14 VITALS — BP 116/72 | HR 84 | Temp 98.0°F | Resp 18 | Ht 64.5 in | Wt 179.0 lb

## 2012-06-14 DIAGNOSIS — E785 Hyperlipidemia, unspecified: Secondary | ICD-10-CM

## 2012-06-14 DIAGNOSIS — Z23 Encounter for immunization: Secondary | ICD-10-CM

## 2012-06-14 DIAGNOSIS — I10 Essential (primary) hypertension: Secondary | ICD-10-CM

## 2012-06-14 DIAGNOSIS — Z8679 Personal history of other diseases of the circulatory system: Secondary | ICD-10-CM

## 2012-06-14 DIAGNOSIS — Z0000 Encounter for general adult medical examination without abnormal findings: Secondary | ICD-10-CM

## 2012-06-14 DIAGNOSIS — J45909 Unspecified asthma, uncomplicated: Secondary | ICD-10-CM

## 2012-06-14 MED ORDER — ALBUTEROL SULFATE HFA 108 (90 BASE) MCG/ACT IN AERS: 2.0000 | INHALATION_SPRAY | Freq: Four times a day (QID) | RESPIRATORY_TRACT | Status: DC | PRN

## 2012-06-14 MED ORDER — LOSARTAN POTASSIUM 100 MG PO TABS: 100.0000 mg | ORAL_TABLET | Freq: Every day | ORAL | Status: DC

## 2012-06-14 MED ORDER — PRAVASTATIN SODIUM 20 MG PO TABS: 20.0000 mg | ORAL_TABLET | Freq: Every day | ORAL | Status: DC

## 2012-06-14 NOTE — Progress Notes (Signed)
Subjective:    Patient ID: Rebecca Yoder, female    DOB: 05-11-1966, 46 y.o.   MRN: [id number]  HPI  46 year old patient who is seen today for a wellness exam. She has a history of cerebrovascular disease and in the spring of 2008 had a small right MCA territory stroke. She has hypertension dyslipidemia which has been well-controlled. No concerns or complaints. She does have a history of asthma that has been fairly stable her only symptom is that episodes of coughing associated with laughter only. Denies any exercise associated wheezing or cough. Blood pressure regimen does include ACE inhibition.  Past Medical History  Diagnosis Date  . ASTHMA 12/10/2009  . CEREBROVASCULAR ACCIDENT, HX OF 12/10/2009  . HYPERLIPIDEMIA 12/10/2009  . HYPERTENSION 12/10/2009    History   Social History  . Marital Status: Single    Spouse Name: N/A    Number of Children: N/A  . Years of Education: N/A   Occupational History  . Not on file.   Social History Main Topics  . Smoking status: Never Smoker   . Smokeless tobacco: Never Used  . Alcohol Use: Yes  . Drug Use: No  . Sexually Active: Not on file   Other Topics Concern  . Not on file   Social History Narrative  . No narrative on file    Past Surgical History  Procedure Date  . Ovarian cyst removal     No family history on file.  Allergies  Allergen Reactions  . Penicillins     Current Outpatient Prescriptions on File Prior to Visit  Medication Sig Dispense Refill  . aspirin 81 MG tablet Take 81 mg by mouth daily.        Marland Kitchen lisinopril (PRINIVIL,ZESTRIL) 20 MG tablet TAKE ONE TABLET BY MOUTH EVERY DAY  90 tablet  3  . Multiple Vitamin (MULTIVITAMIN) tablet Take 1 tablet by mouth daily.        . pravastatin (PRAVACHOL) 20 MG tablet TAKE ONE TABLET BY MOUTH EVERY DAY  90 tablet  1  . PROAIR HFA 108 (90 BASE) MCG/ACT inhaler INHALE TWO PUFFS BY MOUTH EVERY 6 HOURS AND AS NEEDED FOR ASTHMA  9 g  5    BP 116/72  Pulse 84  Temp 98 F  (36.7 C) (Oral)  Resp 18  Ht 5' 4.5" (1.638 m)  Wt 179 lb (81.194 kg)  BMI 30.25 kg/m2  SpO2 98%       Review of Systems  Constitutional: Negative.   HENT: Negative for hearing loss, congestion, sore throat, rhinorrhea, dental problem, sinus pressure and tinnitus.   Eyes: Negative for pain, discharge and visual disturbance.  Respiratory: Positive for cough. Negative for shortness of breath.   Cardiovascular: Negative for chest pain, palpitations and leg swelling.  Gastrointestinal: Negative for nausea, vomiting, abdominal pain, diarrhea, constipation, blood in stool and abdominal distention.  Genitourinary: Negative for dysuria, urgency, frequency, hematuria, flank pain, vaginal bleeding, vaginal discharge, difficulty urinating, vaginal pain and pelvic pain.  Musculoskeletal: Negative for joint swelling, arthralgias and gait problem.  Skin: Negative for rash.  Neurological: Negative for dizziness, syncope, speech difficulty, weakness, numbness and headaches.  Hematological: Negative for adenopathy.  Psychiatric/Behavioral: Negative for behavioral problems, dysphoric mood and agitation. The patient is not nervous/anxious.        Objective:   Physical Exam  Constitutional: She is oriented to person, place, and time. She appears well-developed and well-nourished.  HENT:  Head: Normocephalic and atraumatic.  Right Ear: External ear normal.  Left Ear: External ear normal.  Mouth/Throat: Oropharynx is clear and moist.  Eyes: Conjunctivae normal and EOM are normal.  Neck: Normal range of motion. Neck supple. No JVD present. No thyromegaly present.  Cardiovascular: Normal rate, regular rhythm, normal heart sounds and intact distal pulses.   No murmur heard. Pulmonary/Chest: Effort normal and breath sounds normal. She has no wheezes. She has no rales.  Abdominal: Soft. Bowel sounds are normal. She exhibits no distension and no mass. There is no tenderness. There is no rebound and no  guarding.  Musculoskeletal: Normal range of motion. She exhibits no edema and no tenderness.  Neurological: She is alert and oriented to person, place, and time. She has normal reflexes. No cranial nerve deficit. She exhibits normal muscle tone. Coordination normal.  Skin: Skin is warm and dry. No rash noted.  Psychiatric: She has a normal mood and affect. Her behavior is normal.          Assessment & Plan:   Preventive health examination Hypertension stable Dyslipidemia stable History of asthma. Will give a trial of losartan. Cough associated with paroxysms of laughter a bit bothersome.  Recheck 6 months

## 2012-06-14 NOTE — Patient Instructions (Signed)
Limit your sodium (Salt) intake    It is important that you exercise regularly, at least 20 minutes 3 to 4 times per week.  If you develop chest pain or shortness of breath seek  medical attention.  You need to lose weight.  Consider a lower calorie diet and regular exercise.  Return in one year for follow-up  Please check your blood pressure on a regular basis.  If it is consistently greater than 150/90, please make an office appointment.  

## 2012-06-15 ENCOUNTER — Other Ambulatory Visit: Payer: Self-pay | Admitting: Internal Medicine

## 2013-06-03 ENCOUNTER — Ambulatory Visit (INDEPENDENT_AMBULATORY_CARE_PROVIDER_SITE_OTHER): Payer: PRIVATE HEALTH INSURANCE | Admitting: Internal Medicine

## 2013-06-03 ENCOUNTER — Encounter: Payer: Self-pay | Admitting: Internal Medicine

## 2013-06-03 VITALS — BP 160/100 | HR 82 | Temp 97.8°F | Resp 20 | Wt 183.0 lb

## 2013-06-03 DIAGNOSIS — I10 Essential (primary) hypertension: Secondary | ICD-10-CM

## 2013-06-03 DIAGNOSIS — Z23 Encounter for immunization: Secondary | ICD-10-CM

## 2013-06-03 MED ORDER — MELOXICAM 15 MG PO TABS: 15.0000 mg | ORAL_TABLET | Freq: Every day | ORAL | Status: DC

## 2013-06-03 NOTE — Progress Notes (Signed)
Subjective:    Patient ID: Rebecca Yoder, female    DOB: 02/22/1966, 47 y.o.   MRN: [id number]  HPI  and 47 year old patient who has a history of treated hypertension as well as dyslipidemia and asthma. She presents with a few weeks history of pain involving her right wrist. 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 She is right-handed. She's had worsening right wrist pain over the dorsal aspect of the wrist. She has been using a wrist splint.  Past Medical History  Diagnosis Date  . ASTHMA 12/10/2009  . CEREBROVASCULAR ACCIDENT, HX OF 12/10/2009  . HYPERLIPIDEMIA 12/10/2009  . HYPERTENSION 12/10/2009    History   Social History  . Marital Status: Single    Spouse Name: N/A    Number of Children: N/A  . Years of Education: N/A   Occupational History  . Not on file.   Social History Main Topics  . Smoking status: Never Smoker   . Smokeless tobacco: Never Used  . Alcohol Use: Yes  . Drug Use: No  . Sexual Activity: Not on file   Other Topics Concern  . Not on file   Social History Narrative  . No narrative on file    Past Surgical History  Procedure Laterality Date  . Ovarian cyst removal      No family history on file.  Allergies  Allergen Reactions  . Penicillins     Current Outpatient Prescriptions on File Prior to Visit  Medication Sig Dispense Refill  . albuterol (PROAIR HFA) 108 (90 BASE) MCG/ACT inhaler Inhale 2 puffs into the lungs every 6 (six) hours as needed for wheezing.  9 g  5  . aspirin 81 MG tablet Take 81 mg by mouth daily.        Marland Kitchen lisinopril (PRINIVIL,ZESTRIL) 20 MG tablet TAKE ONE TABLET BY MOUTH EVERY DAY  90 tablet  3  . losartan (COZAAR) 100 MG tablet Take 1 tablet (100 mg total) by mouth daily.  90 tablet  3  . Multiple Vitamin (MULTIVITAMIN) tablet Take 1 tablet by mouth daily.        . pravastatin (PRAVACHOL) 20 MG tablet Take 1 tablet (20 mg total) by mouth daily.  90 tablet  6   No current  facility-administered medications on file prior to visit.    BP 160/100  Pulse 82  Temp(Src) 97.8 F (36.6 C) (Oral)  Resp 20  Wt 183 lb (83.008 kg)  BMI 30.94 kg/m2  SpO2 96%     Review of Systems  Constitutional: Negative.   HENT: Negative for congestion, dental problem, hearing loss, rhinorrhea, sinus pressure, sore throat and tinnitus.   Eyes: Negative for pain, discharge and visual disturbance.  Respiratory: Negative for cough and shortness of breath.   Cardiovascular: Negative for chest pain, palpitations and leg swelling.  Gastrointestinal: Negative for nausea, vomiting, abdominal pain, diarrhea, constipation, blood in stool and abdominal distention.  Genitourinary: Negative for dysuria, urgency, frequency, hematuria, flank pain, vaginal bleeding, vaginal discharge, difficulty urinating, vaginal pain and pelvic pain.  Musculoskeletal: Positive for arthralgias. Negative for gait problem and joint swelling.  Skin: Negative for rash.  Neurological: Negative for dizziness, syncope, speech difficulty, weakness, numbness and headaches.  Hematological: Negative for adenopathy.  Psychiatric/Behavioral: Negative for behavioral problems, dysphoric mood and agitation. The patient is not nervous/anxious.        Objective:   Physical Exam  Constitutional: She appears well-developed and well-nourished. No distress.  Musculoskeletal:  Mild tenderness over the dorsal aspect of the wrist. There is no soft tissue swelling or active inflammatory changes. Both flexion and extension of the wrist against resistance tended to aggravate the discomfort          Assessment & Plan:   Right wrist tendinitis. Given trial of anti-inflammatory medications. We'll continue to use her wrist splint and avoid overuse activities

## 2013-06-03 NOTE — Patient Instructions (Signed)
Extensor Carpi Ulnaris Tendinitis  with Rehab  Tendonitis involves inflammation of a tendon, which is a soft tissue that connects muscle to bone. The Extensor Carpi Ulnaris (ECU) tendon is vulnerable to tendonitis. The ECU is responsible for straightening and rotating (abducting) the wrist. The ECU is important for gripping and pulling. ECU tendonitis may include inflammation of the ECU tendon lining (sheath). The tendon sheath usually secretes a fluid that allows the tendon to function smoothly, but when it becomes inflamed, function is impaired. This condition may also include a tear in the ECU tendon or muscle (strain). The strain may be classified as a grade 1 or 2 strain. Grade 1 strains cause pain, but the tendon is not lengthened. Grade 2 strains include a lengthened ligament, due to being stretched or partially ruptured. With grade 2 strains there is still function, although the function may be reduced.  SYMPTOMS   · Pain, tenderness, swelling, warmth, or redness on the little finger side of the wrist.  · Pain that worsens when straightening the wrist or bending it toward the little finger.  · Pain with gripping.  · Limited motion of the wrist.  · Crackling sound (crepitation) when the tendon or wrist is moved or touched.  CAUSES   ECU tendonitis is caused by injury to the ECU tendon. It is usually due to chronic or repetitive injuries, but may be due to sharp (acute) injury. Common causes of injury include:  · Strain from unusual use, overuse, or increase in activity, or change in activity of the wrist, hand, or forearm.  · Direct hit (trauma) to the muscles and tendon on the side of the wrist.  · Repetitive motions of the hand and wrist, due to friction of the tendon within the tendon lining.  RISK INCREASES WITH:  · Sports that involve repetitive hand and wrist motions (i.e. golfing, bowling).  · Sports that require gripping (i.e. tennis, golf, weightlifting).  · Heavy labor.  · Poor wrist and forearm  strength and flexibility.  · Failure to warm-up properly before activity.  PREVENTION   · Warm up and stretch properly before activity.  · Allow the body to recover between activities.  · Maintain physical fitness:  · Strength, flexibility, and endurance.  · Cardiovascular fitness.  · Learn and use proper exercise technique.  PROGNOSIS   If treated properly, ECU tendonitis is usually curable within 6 weeks.   RELATED COMPLICATIONS   · Longer healing time, if not properly treated or if not given adequate time to heal.  · Chronically inflamed tendon, causing persistent pain with activity. This may progress to constant pain, restriction of motion, and potentially tearing of the tendon.  · Recurring symptoms, especially if activity is resumed too soon.  · Risks of surgery: infection, bleeding, injury to nerves, continued pain, incomplete release of the tendon lining, recurring symptoms, cutting of the tendon, and weakness of the wrist and grip.  TREATMENT   Treatment initially involves the use of ice and medicine, to help reduce pain and inflammation. Performing stretching and strengthening exercises regularly is important for a quick recovery. These exercises may be completed at home or with a therapist. Your caregiver may recommend the use of a brace or splint to reduce motions that aggravate symptoms. Corticosteroid injections may be recommended. If non-surgical treatment is unsuccessful, then surgery may be needed.   MEDICATION   · If pain medicine is needed, nonsteroidal anti-inflammatory medicines (aspirin and ibuprofen), or other minor pain relievers (acetaminophen), are often   recommended.  · Do not take pain medicine for 7 days before surgery.  · Prescription pain relievers may be given if your caregiver thinks they are needed. Use only as directed and only as much as you need.  · Corticosteroid injections may be given to reduce inflammation. However, these injections should be reserved for serious cases, as  they may only be given a certain number of times.  COLD THERAPY   Cold treatment (icing) relieves pain and reduces inflammation. Cold treatment should be applied for 10 to 15 minutes every 2 to 3 hours, and immediately after activity that aggravates your symptoms. Use ice packs or an ice massage.  SEEK MEDICAL CARE IF:   · Symptoms get worse or do not improve in 2 weeks, despite treatment.  · You experience pain, numbness, or coldness in the hand.  · Blue, gray, or dark color appears in the fingernails.  · Any of the following occur after surgery: increased pain, swelling, redness, drainage of fluids, bleeding in the surgical area, or signs of infection.  · New, unexplained symptoms develop. (Drugs used in treatment may produce side effects.)  EXERCISES  RANGE OF MOTION (ROM) AND STRETCHING EXERCISES - Extensor Carpi Ulnaris Tendinitis  These are some of the initial exercises you may start your recovery program with, until you see your caregiver again or until your symptoms are resolved. Remember:  · Flexible tissue is more tolerant of the stresses placed on it during activity.  · Each stretch should be held for 20 to 30 seconds.  · A gentle stretching sensation should be felt.  RANGE OF MOTION - Wrist Flexion  · Hold your right / left wrist with the fingers pointing down toward the floor.  · Pull down on the wrist until you feel a stretch.  · Hold this position for __________ seconds. Repeat exercise __________ times, __________ times per day.  · This exercise should be done with the elbow:  _____ Bent to 90 degrees.  _____ Straight.  RANGE OF MOTION - Wrist Flexion  · Place the back of your right / left hand flat on the top of a table. Your shoulder should be turned in and your fingers facing away from your body.  · Press down, bending your wrist and straightening your elbow until your feel a stretch.  · Hold this position for __________ seconds.  · Repeat exercise __________ times, __________ times per  day.  STRENGTHENING EXERCISES - Extensor Carpi Ulnaris Tendinitis  These are some of the initial exercises you may start your recovery program with, until you see your caregiver again or until your symptoms are resolved. Remember:  · Strong muscles with good endurance tolerate stress better.  · Do the exercises as initially prescribed by your caregiver. Progress slowly with each exercise, gradually increasing the number of repetitions and weight used, only as guided.  RANGE OF MOTION - Wrist Extensors  · Sit or stand with your forearm supported.  · Using a __________ weight or a piece of rubber band or tubing, bend your wrist slowly upward toward you.  · Hold this position for __________ seconds and then slowly lower the wrist back to the starting position.  · Repeat exercise __________ times, __________ times per day.  RANGE OF MOTION - Wrist, Ulnar Deviation  · Stand with a hammer in your hand, or sit while holding onto a rubber band or tubing with both hands, and with your arm supported on a table.  · Raise your hand   with the hammer upward behind you, or pull down on the rubber tubing.  · Hold this position for __________ seconds and then slowly lower the wrist back to the starting position.  · Repeat exercise __________ times, __________ times per day.  STRENGTH - Grip  · Hold a wad of putty, soft modeling clay, a large sponge, a soft rubber ball or a soft tennis ball in your hand.  · Squeeze as hard as you can.  · Hold this position for __________ seconds.  · Repeat exercise __________ times, __________ times per day.  Document Released: 07/28/2005 Document Revised: 10/20/2011 Document Reviewed: 11/09/2008  [employer]® Patient Information ©2014 [employer].

## 2013-06-09 ENCOUNTER — Observation Stay (HOSPITAL_COMMUNITY): Payer: PRIVATE HEALTH INSURANCE

## 2013-06-09 ENCOUNTER — Encounter (HOSPITAL_COMMUNITY): Payer: Self-pay | Admitting: Emergency Medicine

## 2013-06-09 ENCOUNTER — Emergency Department (HOSPITAL_COMMUNITY): Payer: PRIVATE HEALTH INSURANCE

## 2013-06-09 ENCOUNTER — Observation Stay (HOSPITAL_COMMUNITY)
Admission: EM | Admit: 2013-06-09 | Discharge: 2013-06-10 | Disposition: A | Discharge status: Home / Self Care | Payer: PRIVATE HEALTH INSURANCE | Attending: Internal Medicine | Admitting: Internal Medicine

## 2013-06-09 DIAGNOSIS — Q2112 Patent foramen ovale: Secondary | ICD-10-CM

## 2013-06-09 DIAGNOSIS — G459 Transient cerebral ischemic attack, unspecified: Principal | ICD-10-CM | POA: Insufficient documentation

## 2013-06-09 DIAGNOSIS — E876 Hypokalemia: Secondary | ICD-10-CM

## 2013-06-09 DIAGNOSIS — Z7982 Long term (current) use of aspirin: Secondary | ICD-10-CM | POA: Insufficient documentation

## 2013-06-09 DIAGNOSIS — I6350 Cerebral infarction due to unspecified occlusion or stenosis of unspecified cerebral artery: Secondary | ICD-10-CM

## 2013-06-09 DIAGNOSIS — Z23 Encounter for immunization: Secondary | ICD-10-CM | POA: Insufficient documentation

## 2013-06-09 DIAGNOSIS — Z880 Allergy status to penicillin: Secondary | ICD-10-CM | POA: Insufficient documentation

## 2013-06-09 DIAGNOSIS — Q211 Atrial septal defect: Secondary | ICD-10-CM

## 2013-06-09 DIAGNOSIS — Z8679 Personal history of other diseases of the circulatory system: Secondary | ICD-10-CM

## 2013-06-09 DIAGNOSIS — I10 Essential (primary) hypertension: Secondary | ICD-10-CM | POA: Insufficient documentation

## 2013-06-09 DIAGNOSIS — Z79899 Other long term (current) drug therapy: Secondary | ICD-10-CM | POA: Insufficient documentation

## 2013-06-09 DIAGNOSIS — J45909 Unspecified asthma, uncomplicated: Secondary | ICD-10-CM | POA: Insufficient documentation

## 2013-06-09 DIAGNOSIS — E785 Hyperlipidemia, unspecified: Secondary | ICD-10-CM | POA: Insufficient documentation

## 2013-06-09 DIAGNOSIS — I639 Cerebral infarction, unspecified: Secondary | ICD-10-CM

## 2013-06-09 DIAGNOSIS — F411 Generalized anxiety disorder: Secondary | ICD-10-CM | POA: Insufficient documentation

## 2013-06-09 HISTORY — DX: Other enthesopathies, not elsewhere classified: M77.8

## 2013-06-09 HISTORY — DX: Adverse effect of unspecified anesthetic, initial encounter: T41.45XA

## 2013-06-09 HISTORY — DX: Cerebral infarction, unspecified: I63.9

## 2013-06-09 HISTORY — DX: Other complications of anesthesia, initial encounter: T88.59XA

## 2013-06-09 LAB — DIFFERENTIAL
Basophils Relative: 0 % (ref 0–1)
Eosinophils Absolute: 0.4 10*3/uL (ref 0.0–0.7)
Eosinophils Relative: 7 % — ABNORMAL HIGH (ref 0–5)
Lymphocytes Relative: 50 % — ABNORMAL HIGH (ref 12–46)
Lymphs Abs: 3.2 10*3/uL (ref 0.7–4.0)
Monocytes Relative: 10 % (ref 3–12)
Neutro Abs: 2.1 10*3/uL (ref 1.7–7.7)
Neutrophils Relative %: 33 % — ABNORMAL LOW (ref 43–77)

## 2013-06-09 LAB — CBC
HCT: 37.7 % (ref 36.0–46.0)
Hemoglobin: 13.1 g/dL (ref 12.0–15.0)
MCHC: 34.7 g/dL (ref 30.0–36.0)
RBC: 4.36 MIL/uL (ref 3.87–5.11)
WBC: 6.3 10*3/uL (ref 4.0–10.5)

## 2013-06-09 LAB — RAPID URINE DRUG SCREEN, HOSP PERFORMED
Amphetamines: NOT DETECTED
Barbiturates: NOT DETECTED
Benzodiazepines: NOT DETECTED
Cocaine: NOT DETECTED

## 2013-06-09 LAB — POCT I-STAT, CHEM 8
BUN: 15 mg/dL (ref 6–23)
Calcium, Ion: 1.25 mmol/L — ABNORMAL HIGH (ref 1.12–1.23)
Creatinine, Ser: 1 mg/dL (ref 0.50–1.10)
HCT: 38 % (ref 36.0–46.0)
Hemoglobin: 12.9 g/dL (ref 12.0–15.0)
Sodium: 142 mEq/L (ref 135–145)
TCO2: 25 mmol/L (ref 0–100)

## 2013-06-09 LAB — COMPREHENSIVE METABOLIC PANEL
ALT: 26 U/L (ref 0–35)
Alkaline Phosphatase: 60 U/L (ref 39–117)
BUN: 14 mg/dL (ref 6–23)
CO2: 25 mEq/L (ref 19–32)
Chloride: 102 mEq/L (ref 96–112)
Creatinine, Ser: 0.78 mg/dL (ref 0.50–1.10)
GFR calc Af Amer: >90 mL/min (ref >90)
Glucose, Bld: 103 mg/dL — ABNORMAL HIGH (ref 70–99)
Potassium: 3.7 mEq/L (ref 3.5–5.1)
Sodium: 138 mEq/L (ref 135–145)
Total Bilirubin: 0.1 mg/dL — ABNORMAL LOW (ref 0.3–1.2)
Total Protein: 7.7 g/dL (ref 6.0–8.3)

## 2013-06-09 LAB — URINALYSIS, ROUTINE W REFLEX MICROSCOPIC
Glucose, UA: NEGATIVE mg/dL
Ketones, ur: NEGATIVE mg/dL
Leukocytes, UA: NEGATIVE
Nitrite: NEGATIVE
Protein, ur: NEGATIVE mg/dL
pH: 6 (ref 5.0–8.0)

## 2013-06-09 LAB — APTT: aPTT: 27 seconds (ref 24–37)

## 2013-06-09 LAB — TROPONIN I: Troponin I: <0.3 ng/mL (ref <0.30)

## 2013-06-09 LAB — POCT I-STAT TROPONIN I: Troponin i, poc: 0 ng/mL (ref 0.00–0.08)

## 2013-06-09 LAB — GLUCOSE, CAPILLARY: Glucose-Capillary: 89 mg/dL (ref 70–99)

## 2013-06-09 MED ORDER — CLOPIDOGREL BISULFATE 75 MG PO TABS
75.0000 mg | ORAL_TABLET | Freq: Every day | ORAL | Status: DC
  Administered 2013-06-10: 75 mg via ORAL
  Filled 2013-06-09: qty 1

## 2013-06-09 MED ORDER — POTASSIUM CHLORIDE CRYS ER 20 MEQ PO TBCR
40.0000 meq | EXTENDED_RELEASE_TABLET | Freq: Once | ORAL | Status: AC
  Administered 2013-06-09: 40 meq via ORAL
  Filled 2013-06-09: qty 2

## 2013-06-09 MED ORDER — CLOPIDOGREL BISULFATE 75 MG PO TABS
300.0000 mg | ORAL_TABLET | Freq: Once | ORAL | Status: AC
  Administered 2013-06-09: 300 mg via ORAL
  Filled 2013-06-09: qty 4

## 2013-06-09 MED ORDER — CLOPIDOGREL BISULFATE 75 MG PO TABS: 150.0000 mg | ORAL_TABLET | Freq: Every day | ORAL | Status: DC

## 2013-06-09 MED ORDER — ALBUTEROL SULFATE HFA 108 (90 BASE) MCG/ACT IN AERS
2.0000 | INHALATION_SPRAY | Freq: Four times a day (QID) | RESPIRATORY_TRACT | Status: DC | PRN
  Filled 2013-06-09: qty 6.7

## 2013-06-09 MED ORDER — LOSARTAN POTASSIUM 25 MG PO TABS
25.0000 mg | ORAL_TABLET | Freq: Every day | ORAL | Status: DC
  Administered 2013-06-09 – 2013-06-10 (×2): 25 mg via ORAL
  Filled 2013-06-09 (×2): qty 1

## 2013-06-09 MED ORDER — SIMVASTATIN 10 MG PO TABS
10.0000 mg | ORAL_TABLET | Freq: Every day | ORAL | Status: DC
  Filled 2013-06-09: qty 1

## 2013-06-09 MED ORDER — ENOXAPARIN SODIUM 40 MG/0.4ML ~~LOC~~ SOLN
40.0000 mg | SUBCUTANEOUS | Status: DC
  Administered 2013-06-09: 40 mg via SUBCUTANEOUS
  Filled 2013-06-09 (×2): qty 0.4

## 2013-06-09 MED ORDER — CLOPIDOGREL BISULFATE 300 MG PO TABS: 300.0000 mg | ORAL_TABLET | Freq: Every day | ORAL | Status: DC

## 2013-06-09 MED ORDER — PNEUMOCOCCAL VAC POLYVALENT 25 MCG/0.5ML IJ INJ
0.5000 mL | INJECTION | INTRAMUSCULAR | Status: AC
  Administered 2013-06-10: 0.5 mL via INTRAMUSCULAR
  Filled 2013-06-09: qty 0.5

## 2013-06-09 MED ORDER — SODIUM CHLORIDE 0.9 % IV BOLUS (SEPSIS)
500.0000 mL | Freq: Once | INTRAVENOUS | Status: AC
  Administered 2013-06-09: 500 mL via INTRAVENOUS

## 2013-06-09 MED ORDER — SODIUM CHLORIDE 0.9 % IV SOLN
100.0000 mL/h | INTRAVENOUS | Status: DC
  Administered 2013-06-09 – 2013-06-10 (×2): 100 mL/h via INTRAVENOUS

## 2013-06-09 NOTE — ED Provider Notes (Signed)
CSN: [id number]     Arrival date & time 06/09/13  1456 History   First MD Initiated Contact with Patient 06/09/13 1500     Chief Complaint  Patient presents with  . Transient Ischemic Attack    HPI Patient presents to the emergency room after an episode of lightheadedness and inability to speak. Patient developed a headache earlier in the day.  She called her supervisor and while on the telephone trying to speak with her supervisor  she felt like she couldn't get the words out.  This lasted a few minutes.  She has prior history of a stroke so she called 911.  EMS arrived and did not notice any facial droop.  Wile in the ED within a few minutes she had another episode of speech difficulties and the technician noted a facial droop.  Those symptoms have resolved upon my arrival in her room.  She denies any trouble with her sensation.  She does feel like her left leg is heaver than the right. Past Medical History  Diagnosis Date  . ASTHMA 12/10/2009  . CEREBROVASCULAR ACCIDENT, HX OF 12/10/2009  . HYPERLIPIDEMIA 12/10/2009  . HYPERTENSION 12/10/2009   Past Surgical History  Procedure Laterality Date  . Ovarian cyst removal     No family history on file. History  Substance Use Topics  . Smoking status: Never Smoker   . Smokeless tobacco: Never Used  . Alcohol Use: Yes   OB History   Grav Para Term Preterm Abortions TAB SAB Ect Mult Living                 Review of Systems  All other systems reviewed and are negative.    Allergies  Penicillins  Home Medications   Current Outpatient Rx  Name  Route  Sig  Dispense  Refill  . albuterol (PROAIR HFA) 108 (90 BASE) MCG/ACT inhaler   Inhalation   Inhale 2 puffs into the lungs every 6 (six) hours as needed for wheezing.   9 g   5   . aspirin 325 MG tablet   Oral   Take 325 mg by mouth daily.         Marland Kitchen losartan (COZAAR) 100 MG tablet   Oral   Take 1 tablet (100 mg total) by mouth daily.   90 tablet   3   . meloxicam (MOBIC) 15  MG tablet   Oral   Take 15 mg by mouth daily with lunch.         . Multiple Vitamin (MULTIVITAMIN WITH MINERALS) TABS tablet   Oral   Take 1 tablet by mouth daily.         . pravastatin (PRAVACHOL) 20 MG tablet   Oral   Take 20 mg by mouth daily at 3 pm.          BP 160/84  Pulse 80  Temp(Src) 98.6 F (37 C) (Oral)  Resp 17  Wt 183 lb (83.008 kg)  BMI 30.94 kg/m2  SpO2 99%  LMP 05/20/2013 Physical Exam  Nursing note and vitals reviewed. Constitutional: She is oriented to person, place, and time. She appears well-developed and well-nourished. She appears distressed.  HENT:  Head: Normocephalic and atraumatic.  Right Ear: External ear normal.  Left Ear: External ear normal.  Mouth/Throat: Oropharynx is clear and moist.  Eyes: Conjunctivae are normal. Right eye exhibits no discharge. Left eye exhibits no discharge. No scleral icterus.  Neck: Neck supple. No tracheal deviation present.  Cardiovascular: Normal rate,  regular rhythm and intact distal pulses.   Pulmonary/Chest: Effort normal and breath sounds normal. No stridor. No respiratory distress. She has no wheezes. She has no rales.  Abdominal: Soft. Bowel sounds are normal. She exhibits no distension. There is no tenderness. There is no rebound and no guarding.  Musculoskeletal: She exhibits no edema and no tenderness.  Neurological: She is alert and oriented to person, place, and time. She has normal strength. No cranial nerve deficit ( no gross defecits noted) or sensory deficit. She exhibits normal muscle tone. She displays no seizure activity. Coordination normal.  No pronator drift bilateral upper extrem, able to hold both legs off bed for 5 seconds, sensation intact in all extremities, no visual field cuts, no left or right sided neglect  Skin: Skin is warm and dry. No rash noted.  Psychiatric: Her mood appears anxious.    ED Course  Procedures (including critical care time) Labs Review Labs Reviewed   DIFFERENTIAL - Abnormal; Notable for the following:    Neutrophils Relative % 33 (*)    Lymphocytes Relative 50 (*)    Eosinophils Relative 7 (*)    All other components within normal limits  COMPREHENSIVE METABOLIC PANEL - Abnormal; Notable for the following:    Glucose, Bld 103 (*)    Total Bilirubin 0.1 (*)    All other components within normal limits  POCT I-STAT, CHEM 8 - Abnormal; Notable for the following:    Potassium 3.2 (*)    Calcium, Ion 1.25 (*)    All other components within normal limits  ETHANOL  PROTIME-INR  APTT  CBC  TROPONIN I  URINE RAPID DRUG SCREEN (HOSP PERFORMED)  URINALYSIS, ROUTINE W REFLEX MICROSCOPIC  GLUCOSE, CAPILLARY  POCT I-STAT TROPONIN I   Imaging Review Ct Head Wo Contrast  06/09/2013   CLINICAL DATA:  Near syncopal episode, remote right MCA territory infarct  EXAM: CT HEAD WITHOUT CONTRAST  TECHNIQUE: Contiguous axial images were obtained from the base of the skull through the vertex without contrast.  COMPARISON:  10/22/2006  FINDINGS: Encephalomalacia in the right frontal lobe from a prior infarct. No acute intracranial hemorrhage, definite new infarction, mass lesion, midline shift, herniation, hydrocephalus, or extra-axial fluid collection. Cisterns are patent. No cerebellar abnormality. Orbits are symmetric. Mastoids and sinuses are clear.  IMPRESSION: Remote right frontal infarct.  No acute finding   Electronically Signed   By: Ruel Favors M.D.   On: 06/09/2013 15:50    EKG Interpretation     Ventricular Rate:  96 PR Interval:  140 QRS Duration: 95 QT Interval:  331 QTC Calculation: 418 R Axis:   59 Text Interpretation:  Sinus rhythm RSR' in V1 or V2, right VCD or RVH Baseline wander in lead(s) I II aVR aVL No significant change since last tracing           Old records reviewed.  Patient does have confirmed stroke based on prior MRI 1520 no focal deficits on my exam at this time. I will not activate a code stroke as her  symptoms have resolved  but I will consult the neurologist considering her concerning waxing and waning symptoms. 1600  Stroke team has evaluated patient.  She will be admitted for further evaluation. MDM   1. TIA (transient ischemic attack)    Pt's symptoms are concerning for TIA especially with her known history of prior stroke at a young age.  Plan on admission to the hospital for further evaluation.     Lynnda Shields  Lynelle Doctor, MD 06/09/13 [phone]

## 2013-06-09 NOTE — H&P (Signed)
[facility] History and Physical  Rebecca Yoder ZOX:[id number] DOB: [birth year] DOA: 06/09/2013  Referring physician: Linwood Dibbles PCP: Rogelia Boga, MD  Specialists: [facility]  Chief Complaint: Difficulty speaking and facial droop  HPI: Rebecca Yoder is a 47 y.o. female 47 y.o. female with history of right frontal CVA in 2008.  Patient did have a TEE showing PFO and was followed up by [facility], but stroke was felt to be related to initiation of oral contraceptives. Hypercoagulable panel was negative. Patient states she awoke this AM, went to work and noted she was not feeling well. She went to the bathroom and noted she could not use her left arm and leg well and fell --this was at 10 AM. She called her supervisor who noted she was slurring her words and could not express herself. This last for about 20 minutes and she called 9-1-1. On EMS arrival no symptoms were noted. On arrival no symptoms were seen. While in ED twice nurse noted transient difficulty expressing herself, left facial droop and slurred speech. Currently she is hypertensive but showing no other symptoms.  While in ED she had another episode of left facial droop and dysarthria around 16:15 this would make a total of 4 episodes. The symptoms are the same every time. Dysarthria, left facial droop, left arm numbness and weakness. Takes aspirin 325 mg daily  Review of Systems: Systems reviewed. As above otherwise negative.  Past Medical History  Diagnosis Date  . ASTHMA 12/10/2009  . CEREBROVASCULAR ACCIDENT, HX OF 12/10/2009  . HYPERLIPIDEMIA 12/10/2009  . HYPERTENSION 12/10/2009   patent foramen ovale  Past Surgical History  Procedure Laterality Date  . Ovarian cyst removal     Social History:  reports that she has never smoked. She has never used smokeless tobacco. She reports that she drinks alcohol. She reports that she does not use illicit drugs.   Allergies  Allergen Reactions  . Penicillins Rash   Family  history: Significant for stroke  Prior to Admission medications   Medication Sig Start Date End Date Taking? Authorizing Provider  albuterol (PROAIR HFA) 108 (90 BASE) MCG/ACT inhaler Inhale 2 puffs into the lungs every 6 (six) hours as needed for wheezing. 06/14/12  Yes Gordy Savers, MD  aspirin 325 MG tablet Take 325 mg by mouth daily.   Yes Historical Provider, MD  losartan (COZAAR) 100 MG tablet Take 1 tablet (100 mg total) by mouth daily. 06/14/12  Yes Gordy Savers, MD  meloxicam (MOBIC) 15 MG tablet Take 15 mg by mouth daily with lunch.   Yes Historical Provider, MD  Multiple Vitamin (MULTIVITAMIN WITH MINERALS) TABS tablet Take 1 tablet by mouth daily.   Yes Historical Provider, MD  pravastatin (PRAVACHOL) 20 MG tablet Take 20 mg by mouth daily at 3 pm.   Yes Historical Provider, MD   Physical Exam: Filed Vitals:   06/09/13 1634  BP: 160/84  Pulse: 80  Temp:   Resp: 17   BP 160/84  Pulse 80  Temp(Src) 98.6 F (37 C) (Oral)  Resp 17  Wt 83.008 kg (183 lb)  BMI 30.94 kg/m2  SpO2 99%  LMP 05/20/2013  General Appearance:    Alert, cooperative, no distress, appears stated age  Head:    Normocephalic, without obvious abnormality, atraumatic  Eyes:    PERRL, conjunctiva/corneas clear, EOM's intact, fundi    benign, both eyes     Nose:   Nares normal, septum midline, mucosa normal, no drainage    or  sinus tenderness  Throat:   Lips, mucosa, and tongue normal; teeth and gums normal  Neck:   Supple, symmetrical, trachea midline, no adenopathy;    thyroid:  no enlargement/tenderness/nodules; no carotid   bruit or JVD  Back:     Symmetric, no curvature, ROM normal, no CVA tenderness  Lungs:     Clear to auscultation bilaterally, respirations unlabored  Chest Wall:    No tenderness or deformity   Heart:    Regular rate and rhythm, S1 and S2 normal, no murmur, rub   or gallop  Breast Exam:    deferred  Abdomen:     Soft, non-tender, bowel sounds active all four  quadrants,    no masses, no organomegaly  Genitalia:    deferred  Rectal:    deferred  Extremities:   Extremities normal, atraumatic, no cyanosis or edema  Pulses:   2+ and symmetric all extremities  Skin:   Skin color, texture, turgor normal, no rashes or lesions  Lymph nodes:   Cervical, supraclavicular, and axillary nodes normal  Neurologic:   CNII-XII intact, normal strength, sensation and reflexes    Throughout.  speech clear and fluent.     Labs on Admission:  Basic Metabolic Panel:  Recent Labs Lab 06/09/13 1509 06/09/13 1534  NA 138 142  K 3.7 3.2*  CL 102 104  CO2 25  --   GLUCOSE 103* 94  BUN 14 15  CREATININE 0.78 1.00  CALCIUM 9.4  --    Liver Function Tests:  Recent Labs Lab 06/09/13 1509  AST 24  ALT 26  ALKPHOS 60  BILITOT 0.1*  PROT 7.7  ALBUMIN 4.1   No results found for this basename: LIPASE, AMYLASE,  in the last 168 hours No results found for this basename: AMMONIA,  in the last 168 hours CBC:  Recent Labs Lab 06/09/13 1509 06/09/13 1534  WBC 6.3  --   NEUTROABS 2.1  --   HGB 13.1 12.9  HCT 37.7 38.0  MCV 86.5  --   PLT 328  --    Cardiac Enzymes:  Recent Labs Lab 06/09/13 1509  TROPONINI <0.30    BNP (last 3 results) No results found for this basename: PROBNP,  in the last 8760 hours CBG:  Recent Labs Lab 06/09/13 1532  GLUCAP 89    Radiological Exams on Admission: Ct Head Wo Contrast  06/09/2013   CLINICAL DATA:  Near syncopal episode, remote right MCA territory infarct  EXAM: CT HEAD WITHOUT CONTRAST  TECHNIQUE: Contiguous axial images were obtained from the base of the skull through the vertex without contrast.  COMPARISON:  10/22/2006  FINDINGS: Encephalomalacia in the right frontal lobe from a prior infarct. No acute intracranial hemorrhage, definite new infarction, mass lesion, midline shift, herniation, hydrocephalus, or extra-axial fluid collection. Cisterns are patent. No cerebellar abnormality. Orbits are  symmetric. Mastoids and sinuses are clear.  IMPRESSION: Remote right frontal infarct.  No acute finding   Electronically Signed   By: Ruel Favors M.D.   On: 06/09/2013 15:50    EKG: NSR RSR' in V1 or V2, right VCD or RVH Baseline wander in lead(s) I II aVR aVL  Assessment/Plan  Principal Problem:   TIA (transient ischemic attack) symptoms now resolved.  Active Problems:   HYPERLIPIDEMIA   HYPERTENSION: Hold antihypertensives for now.   CEREBROVASCULAR ACCIDENT, HX OF, felt to be related to oral contraceptives. Has been on aspirin daily since 2008.   Hypokalemia: Replete PFO  MRI/MRA pending. Observation.  Telemetry. Check hemoglobin A1c, fasting lipids, echo, carotid Dopplers. Neurology has ordered Plavix load.  Code Status: full Family Communication:  Disposition Plan: home  Time spent: 45 min  Jeroline Wolbert L [facility] Pager [phone]  If 7PM-7AM, please contact night-coverage [email or web address] Password [email or web address] 06/09/2013, 5:15 PM

## 2013-06-09 NOTE — Consult Note (Signed)
Referring Physician: Lynelle Doctor    Chief Complaint: transient and recurrent left facial numbness, droop and aphasia.  HPI:                                                                                                                                         Rebecca Yoder is an 47 y.o. female with history of right frontal CVA in 2008.  Information from chart at that time is limited. Patient did have a TEE showing PFO and was followed up by [facility].  I cannot tell if she had a hypercoagulable panel at that time and sister states it "was believed to be due to initiation of birth control. Patient states she awoke this AM, went to work and noted she was not feeling well.  She went to the bathroom and noted she could not use her left arm and leg well and fell --this was at 10 AM.  She called her supervisor who noted she was slurring her words and could not express herself. This last for about 20 minutes and she called 9-1-1. On EMS arrival no symptoms were noted. On arrival no symptoms were seen. While in ED twice nurse noted transient difficulty expressing herself, left facial droop and slurred speech. Currently she is hypertensive but showing no other symptoms.   While in ED she had another episode of left facial droop and dysarthria around 16:15 this would make a total of 4 episodes.  The symptoms are the same every time.  Dysarthria, left facial droop, left arm numbness and weakness.   Date last known well: Date: 06/09/2013 Time last known well: Time: 10:00 tPA Given: No: out of window and resolved symptoms.   Past Medical History  Diagnosis Date  . ASTHMA 12/10/2009  . CEREBROVASCULAR ACCIDENT, HX OF 12/10/2009  . HYPERLIPIDEMIA 12/10/2009  . HYPERTENSION 12/10/2009    Past Surgical History  Procedure Laterality Date  . Ovarian cyst removal      No family history on file. Social History:  reports that she has never smoked. She has never used smokeless tobacco. She reports that she drinks alcohol. She  reports that she does not use illicit drugs.  Allergies:  Allergies  Allergen Reactions  . Penicillins Rash    Medications:  Current Facility-Administered Medications  Medication Dose Route Frequency Provider Last Rate Last Dose  . sodium chloride 0.9 % bolus 500 mL  500 mL Intravenous Once Celene Kras, MD       Followed by  . 0.9 %  sodium chloride infusion  100 mL/hr Intravenous Continuous Celene Kras, MD       Current Outpatient Prescriptions  Medication Sig Dispense Refill  . albuterol (PROAIR HFA) 108 (90 BASE) MCG/ACT inhaler Inhale 2 puffs into the lungs every 6 (six) hours as needed for wheezing.  9 g  5  . aspirin 325 MG tablet Take 325 mg by mouth daily.      Marland Kitchen losartan (COZAAR) 100 MG tablet Take 1 tablet (100 mg total) by mouth daily.  90 tablet  3  . meloxicam (MOBIC) 15 MG tablet Take 15 mg by mouth daily with lunch.      . Multiple Vitamin (MULTIVITAMIN WITH MINERALS) TABS tablet Take 1 tablet by mouth daily.      . pravastatin (PRAVACHOL) 20 MG tablet Take 20 mg by mouth daily at 3 pm.         ROS:                                                                                                                                       History obtained from the patient  General ROS: negative for - chills, fatigue, fever, night sweats, weight gain or weight loss Psychological ROS: negative for - behavioral disorder, hallucinations, memory difficulties, mood swings or suicidal ideation Ophthalmic ROS: negative for - blurry vision, double vision, eye pain or loss of vision ENT ROS: negative for - epistaxis, nasal discharge, oral lesions, sore throat, tinnitus or vertigo Allergy and Immunology ROS: negative for - hives or itchy/watery eyes Hematological and Lymphatic ROS: negative for - bleeding problems, bruising or swollen lymph nodes Endocrine ROS:  negative for - galactorrhea, hair pattern changes, polydipsia/polyuria or temperature intolerance Respiratory ROS: negative for - cough, hemoptysis, shortness of breath or wheezing Cardiovascular ROS: negative for - chest pain, dyspnea on exertion, edema or irregular heartbeat Gastrointestinal ROS: negative for - abdominal pain, diarrhea, hematemesis, nausea/vomiting or stool incontinence Genito-Urinary ROS: negative for - dysuria, hematuria, incontinence or urinary frequency/urgency Musculoskeletal ROS: negative for - joint swelling or muscular weakness Neurological ROS: as noted in HPI Dermatological ROS: negative for rash and skin lesion changes  Neurologic Examination:  Blood pressure 189/96, pulse 134, temperature 98.6 F (37 C), temperature source Oral, resp. rate 20, weight 83.008 kg (183 lb), last menstrual period 05/20/2013, SpO2 100.00%.   Mental Status: Alert, oriented, thought content appropriate.  Speech fluent without evidence of aphasia.  Able to follow 3 step commands without difficulty. Cranial Nerves: II: Discs flat bilaterally; Visual fields grossly normal, pupils equal, round, reactive to light and accommodation III,IV, VI: ptosis not present, extra-ocular motions intact bilaterally V,VII: smile symmetric, facial light touch sensation normal bilaterally VIII: hearing normal bilaterally IX,X: gag reflex present XI: bilateral shoulder shrug XII: midline tongue extension without atrophy or fasciculations  Motor: Right : Upper extremity   5/5    Left:     Upper extremity   22/5 (old)  Lower extremity   5/5     Lower extremity   79/5 (old) Tone and bulk:normal tone throughout; no atrophy noted Sensory: Pinprick and light touch intact throughout, bilaterally Deep Tendon Reflexes:  Right: Upper Extremity   Left: Upper extremity   biceps (C-5 to C-6) 2/4   biceps (C-5 to C-6)  2/4 tricep (C7) 2/4    triceps (C7) 2/4 Brachioradialis (C6) 2/4  Brachioradialis (C6) 2/4  Lower Extremity Lower Extremity  quadriceps (L-2 to L-4) 2/4   quadriceps (L-2 to L-4) 2/4 Achilles (S1) 2/4   Achilles (S1) 2/4  Plantars: Right: downgoing   Left: downgoing Cerebellar: normal finger-to-nose,  normal heel-to-shin test Gait: not assessed CV: pulses palpable throughout    Results for orders placed during the hospital encounter of 06/09/13 (from the past 48 hour(s))  PROTIME-INR     Status: None   Collection Time    06/09/13  3:09 PM      Result Value Range   Prothrombin Time 12.9  11.6 - 15.2 seconds   INR 0.99  0.00 - 1.49  APTT     Status: None   Collection Time    06/09/13  3:09 PM      Result Value Range   aPTT 27  24 - 37 seconds  CBC     Status: None   Collection Time    06/09/13  3:09 PM      Result Value Range   WBC 6.3  4.0 - 10.5 K/uL   RBC 4.36  3.87 - 5.11 MIL/uL   Hemoglobin 13.1  12.0 - 15.0 g/dL   HCT 60.4  54.0 - 98.1 %   MCV 86.5  78.0 - 100.0 fL   MCH 30.0  26.0 - 34.0 pg   MCHC 34.7  30.0 - 36.0 g/dL   RDW 19.1  47.8 - 29.5 %   Platelets 328  150 - 400 K/uL  DIFFERENTIAL     Status: Abnormal   Collection Time    06/09/13  3:09 PM      Result Value Range   Neutrophils Relative % 33 (*) 43 - 77 %   Lymphocytes Relative 50 (*) 12 - 46 %   Monocytes Relative 10  3 - 12 %   Eosinophils Relative 7 (*) 0 - 5 %   Basophils Relative 0  0 - 1 %   Neutro Abs 2.1  1.7 - 7.7 K/uL   Lymphs Abs 3.2  0.7 - 4.0 K/uL   Monocytes Absolute 0.6  0.1 - 1.0 K/uL   Eosinophils Absolute 0.4  0.0 - 0.7 K/uL   Basophils Absolute 0.0  0.0 - 0.1 K/uL   WBC Morphology ATYPICAL LYMPHOCYTES    URINE RAPID  DRUG SCREEN (HOSP PERFORMED)     Status: None   Collection Time    06/09/13  3:10 PM      Result Value Range   Opiates NONE DETECTED  NONE DETECTED   Cocaine NONE DETECTED  NONE DETECTED   Benzodiazepines NONE DETECTED  NONE DETECTED   Amphetamines NONE  DETECTED  NONE DETECTED   Tetrahydrocannabinol NONE DETECTED  NONE DETECTED   Barbiturates NONE DETECTED  NONE DETECTED   Comment:            DRUG SCREEN FOR MEDICAL PURPOSES     ONLY.  IF CONFIRMATION IS NEEDED     FOR ANY PURPOSE, NOTIFY LAB     WITHIN 5 DAYS.                LOWEST DETECTABLE LIMITS     FOR URINE DRUG SCREEN     Drug Class       Cutoff (ng/mL)     Amphetamine      1000     Barbiturate      200     Benzodiazepine   200     Tricyclics       300     Opiates          300     Cocaine          300     THC              50  URINALYSIS, ROUTINE W REFLEX MICROSCOPIC     Status: None   Collection Time    06/09/13  3:10 PM      Result Value Range   Color, Urine YELLOW  YELLOW   APPearance CLEAR  CLEAR   Specific Gravity, Urine 1.012  1.005 - 1.030   pH 6.0  5.0 - 8.0   Glucose, UA NEGATIVE  NEGATIVE mg/dL   Hgb urine dipstick NEGATIVE  NEGATIVE   Bilirubin Urine NEGATIVE  NEGATIVE   Ketones, ur NEGATIVE  NEGATIVE mg/dL   Protein, ur NEGATIVE  NEGATIVE mg/dL   Urobilinogen, UA 0.2  0.0 - 1.0 mg/dL   Nitrite NEGATIVE  NEGATIVE   Leukocytes, UA NEGATIVE  NEGATIVE   Comment: MICROSCOPIC NOT DONE ON URINES WITH NEGATIVE PROTEIN, BLOOD, LEUKOCYTES, NITRITE, OR GLUCOSE <1000 mg/dL.  GLUCOSE, CAPILLARY     Status: None   Collection Time    06/09/13  3:32 PM      Result Value Range   Glucose-Capillary 89  70 - 99 mg/dL  POCT I-STAT TROPONIN I     Status: None   Collection Time    06/09/13  3:33 PM      Result Value Range   Troponin i, poc 0.00  0.00 - 0.08 ng/mL   Comment 3            Comment: Due to the release kinetics of cTnI,     a negative result within the first hours     of the onset of symptoms does not rule out     myocardial infarction with certainty.     If myocardial infarction is still suspected,     repeat the test at appropriate intervals.  POCT I-STAT, CHEM 8     Status: Abnormal   Collection Time    06/09/13  3:34 PM      Result Value Range    Sodium 142  135 - 145 mEq/L   Potassium 3.2 (*) 3.5 - 5.1 mEq/L  Chloride 104  96 - 112 mEq/L   BUN 15  6 - 23 mg/dL   Creatinine, Ser 8.11  0.50 - 1.10 mg/dL   Glucose, Bld 94  70 - 99 mg/dL   Calcium, Ion 9.14 (*) 1.12 - 1.23 mmol/L   TCO2 25  0 - 100 mmol/L   Hemoglobin 12.9  12.0 - 15.0 g/dL   HCT 78.2  95.6 - 21.3 %   Ct Head Wo Contrast  06/09/2013   CLINICAL DATA:  Near syncopal episode, remote right MCA territory infarct  EXAM: CT HEAD WITHOUT CONTRAST  TECHNIQUE: Contiguous axial images were obtained from the base of the skull through the vertex without contrast.  COMPARISON:  10/22/2006  FINDINGS: Encephalomalacia in the right frontal lobe from a prior infarct. No acute intracranial hemorrhage, definite new infarction, mass lesion, midline shift, herniation, hydrocephalus, or extra-axial fluid collection. Cisterns are patent. No cerebellar abnormality. Orbits are symmetric. Mastoids and sinuses are clear.  IMPRESSION: Remote right frontal infarct.  No acute finding   Electronically Signed   By: Ruel Favors M.D.   On: 06/09/2013 15:50    Assessment and plan discussed with with attending physician and they are in agreement.    Felicie Morn PA-C [facility] [phone]  06/09/2013, 4:02 PM   Assessment: 47 y.o. female with stuttering TIA. I suspect that she has had some areas of infarction, and we'll get an MRI. She will need admission for further evaluation for stroke, but I am worried about her recurrent episodes. She is already on aspirin therapy, I will add Plavix at this time. She reportedly had a hypercoagulable workup with her previous stroke, though I'm having difficulty accessing this.  Stroke Risk Factors - hyperlipidemia, hypertension and CVA  1. HgbA1c, fasting lipid panel 2. MRI, MRA  of the brain without contrast 3. Frequent neuro checks 4. Echocardiogram 5. Carotid dopplers 6. Prophylactic therapy-Antiplatelet med: Plavix - dose 75mg  following  load 7. Risk factor modification 8. Telemetry monitoring 9. PT consult, OT consult, Speech consult  Ritta Slot, MD [facility] [phone]  If 7pm- 7am, please page neurology on call at [phone].

## 2013-06-09 NOTE — ED Notes (Signed)
Patient arrived via [facility] from home with near syncopal episode and inability to communicate her words. She was on the phone with her boss and was unable to get her words out. She states she had a headache earlier. EMS states she was talking fine and had no facial droop or weakness upon their arrival. Patient is hypertensive, alert and oriented.

## 2013-06-09 NOTE — ED Notes (Signed)
Called to room by NT, patient had sudden onset of left sided facial droop and slurred speech. EDP was called to room. Upon his arrival patient's symptoms resolved. 10 min later patient had another episode of sudden left sided facial droop and slurred speech.

## 2013-06-09 NOTE — ED Notes (Signed)
Report called to Marylu Lund, RN unit 3W.

## 2013-06-10 DIAGNOSIS — I517 Cardiomegaly: Secondary | ICD-10-CM

## 2013-06-10 DIAGNOSIS — G459 Transient cerebral ischemic attack, unspecified: Secondary | ICD-10-CM

## 2013-06-10 LAB — BASIC METABOLIC PANEL
BUN: 9 mg/dL (ref 6–23)
CO2: 22 mEq/L (ref 19–32)
Calcium: 9 mg/dL (ref 8.4–10.5)
Chloride: 104 mEq/L (ref 96–112)
Creatinine, Ser: 0.74 mg/dL (ref 0.50–1.10)
GFR calc Af Amer: >90 mL/min (ref >90)
GFR calc non Af Amer: >90 mL/min (ref >90)
Glucose, Bld: 95 mg/dL (ref 70–99)

## 2013-06-10 MED ORDER — AMLODIPINE BESYLATE 2.5 MG PO TABS: 2.5000 mg | ORAL_TABLET | Freq: Every day | ORAL | Status: DC

## 2013-06-10 MED ORDER — CLOPIDOGREL BISULFATE 75 MG PO TABS: 75.0000 mg | ORAL_TABLET | Freq: Every day | ORAL | Status: DC

## 2013-06-10 MED ORDER — ACETAMINOPHEN 325 MG PO TABS
650.0000 mg | ORAL_TABLET | Freq: Four times a day (QID) | ORAL | Status: DC | PRN
  Administered 2013-06-10: 650 mg via ORAL
  Filled 2013-06-10: qty 2

## 2013-06-10 MED ORDER — ACETAMINOPHEN 325 MG PO TABS: 650.0000 mg | ORAL_TABLET | Freq: Four times a day (QID) | ORAL | Status: AC | PRN

## 2013-06-10 NOTE — Progress Notes (Signed)
*  PRELIMINARY RESULTS* Vascular Ultrasound Carotid Duplex (Doppler) has been completed.  Preliminary findings: Bilateral:  1-39% ICA stenosis.  Vertebral artery flow is antegrade.      Farrel Demark, RDMS, RVT  06/10/2013, 10:44 AM

## 2013-06-10 NOTE — Progress Notes (Signed)
Stroke Team Progress Note  HISTORY Rebecca Yoder is an 47 y.o. female with history of right frontal CVA in 2008. Information from chart at that time is limited. Patient did have a TEE showing PFO and was followed up by [facility]. I cannot tell if she had a hypercoagulable panel at that time and sister states it "was believed to be due to initiation of birth control. Patient states she awoke this AM, went to work and noted she was not feeling well. She went to the bathroom and noted she could not use her left arm and leg well and fell --this was at 10 AM. She called her supervisor who noted she was slurring her words and could not express herself. This last for about 20 minutes and she called 9-1-1. On EMS arrival no symptoms were noted. On arrival no symptoms were seen. While in ED twice nurse noted transient difficulty expressing herself, left facial droop and slurred speech. Currently she is hypertensive but showing no other symptoms.  While in ED she had another episode of left facial droop and dysarthria around 16:15 this would make a total of 4 episodes. The symptoms are the same every time. Dysarthria, left facial droop, left arm numbness and weakness.   Date last known well: Date: 06/09/2013  Time last known well: Time: 10:00  tPA Given: No: out of window and resolved symptoms.    She was admitted to the neuro ICU for further evaluation and treatment.   SUBJECTIVE She is lying in bed  Overall she feels her condition is completely resolved.    OBJECTIVE Most recent Vital Signs: Filed Vitals:   06/10/13 0200 06/10/13 0400 06/10/13 0800 06/10/13 1000  BP: 123/70 137/76 149/80 168/77  Pulse:  69 96 83  Temp:  98.2 F (36.8 C)  98.4 F (36.9 C)  TempSrc:  Oral  Oral  Resp:    18  Height:      Weight:      SpO2:  97%  100%   CBG (last 3)   Recent Labs  06/09/13 1532  GLUCAP 89    IV Fluid Intake:   . sodium chloride 100 mL/hr (06/10/13 0806)    MEDICATIONS  . clopidogrel  75  mg Oral Q breakfast  . enoxaparin (LOVENOX) injection  40 mg Subcutaneous Q24H  . losartan  25 mg Oral Daily  . simvastatin  10 mg Oral q1800   PRN:  acetaminophen, albuterol  Diet:  Cardiac thin liquids Activity:  DVT Prophylaxis:  lovenox  CLINICALLY SIGNIFICANT STUDIES Basic Metabolic Panel:   Recent Labs Lab 06/09/13 1509 06/09/13 1534 06/10/13 0851  NA 138 142 137  K 3.7 3.2* 3.9  CL 102 104 104  CO2 25  --  22  GLUCOSE 103* 94 95  BUN 14 15 9   CREATININE 0.78 1.00 0.74  CALCIUM 9.4  --  9.0   Liver Function Tests:   Recent Labs Lab 06/09/13 1509  AST 24  ALT 26  ALKPHOS 60  BILITOT 0.1*  PROT 7.7  ALBUMIN 4.1   CBC:   Recent Labs Lab 06/09/13 1509 06/09/13 1534  WBC 6.3  --   NEUTROABS 2.1  --   HGB 13.1 12.9  HCT 37.7 38.0  MCV 86.5  --   PLT 328  --    Coagulation:   Recent Labs Lab 06/09/13 1509  LABPROT 12.9  INR 0.99   Cardiac Enzymes:   Recent Labs Lab 06/09/13 1509  TROPONINI <0.30  Urinalysis:   Recent Labs Lab 06/09/13 1510  COLORURINE YELLOW  LABSPEC 1.012  PHURINE 6.0  GLUCOSEU NEGATIVE  HGBUR NEGATIVE  BILIRUBINUR NEGATIVE  KETONESUR NEGATIVE  PROTEINUR NEGATIVE  UROBILINOGEN 0.2  NITRITE NEGATIVE  LEUKOCYTESUR NEGATIVE   Lipid Panel    Component Value Date/Time   CHOL 143 06/07/2012 1202   TRIG 129.0 06/07/2012 1202   HDL 43.10 06/07/2012 1202   CHOLHDL 3 06/07/2012 1202   VLDL 25.8 06/07/2012 1202   LDLCALC 74 06/07/2012 1202   HgbA1C  No results found for this basename: HGBA1C    Urine Drug Screen:     Component Value Date/Time   LABOPIA NONE DETECTED 06/09/2013 1510   COCAINSCRNUR NONE DETECTED 06/09/2013 1510   LABBENZ NONE DETECTED 06/09/2013 1510   AMPHETMU NONE DETECTED 06/09/2013 1510   THCU NONE DETECTED 06/09/2013 1510   LABBARB NONE DETECTED 06/09/2013 1510    Alcohol Level:   Recent Labs Lab 06/09/13 1509  ETH <11    Ct Head Wo Contrast 06/09/2013 : Remote right frontal  infarct.  No acute finding     Mr Shirlee Latch Wo Contrast 06/09/2013   Acute very Carotid Duplex (Doppler) has been completed. Preliminary findings: Bilateral: 1-39% ICA stenosis. Vertebral artery flow is antegrade.    Remote moderate size right operculum the/basal ganglia/ subinsular infarct with encephalomalacia and subsequent dilation right lateral ventricle.  Marked motion degradation on the MR angiogram. This limits evaluating for detection of an aneurysm or grading stenosis accurately.  The possibility of stenosis of proximal right middle cerebral artery branch vessels is raised.  Mr Brain Wo Contrast 06/09/2013   Acute very small nonhemorrhagic infarct posterior right external capsule and posterior right corona radiata.  Remote moderate size right operculum the/basal ganglia/ subinsular infarct with encephalomalacia and subsequent dilation right lateral ventricle.  Questionable tiny remote infarct left caudate head.  No intracranial hemorrhage.  Mild atrophy without hydrocephalus.  Cervical medullary junction, pituitary region, pineal region and orbital structures unremarkable.    2D Echocardiogram  EF 55%, wall motion normal. Previous TEE does show PFO.  Carotid Doppler  Carotid Duplex (Doppler) has been completed. Preliminary findings: Bilateral: 1-39% ICA stenosis. Vertebral artery flow is antegrade.   CXR    EKG  normal sinus rhythm.   Therapy Recommendations   Physical Exam   Alert, oriented, thought content appropriate. Speech fluent without evidence of aphasia. Able to follow 3 step commands without difficulty.  Cranial Nerves:  II: Discs flat bilaterally; Visual fields grossly normal, pupils equal, round, reactive to light and accommodation  III,IV, VI: ptosis not present, extra-ocular motions intact bilaterally  V,VII: smile symmetric, facial light touch sensation normal bilaterally  VIII: hearing normal bilaterally  IX,X: gag reflex present  XI: bilateral shoulder shrug   XII: midline tongue extension without atrophy or fasciculations  Motor:  Right : Upper extremity 5/5 Left: Upper extremity 31/5 (old)  Lower extremity 5/5 Lower extremity 68/5 (old)  Tone and bulk:normal tone throughout; no atrophy noted  Sensory: Pinprick and light touch intact throughout, bilaterally  Deep Tendon Reflexes:  Right: Upper Extremity Left: Upper extremity  biceps (C-5 to C-6) 2/4 biceps (C-5 to C-6) 2/4  tricep (C7) 2/4 triceps (C7) 2/4  Brachioradialis (C6) 2/4 Brachioradialis (C6) 2/4  Lower Extremity Lower Extremity  quadriceps (L-2 to L-4) 2/4 quadriceps (L-2 to L-4) 2/4  Achilles (S1) 2/4 Achilles (S1) 2/4  Plantars:  Right: downgoing Left: downgoing  Cerebellar:  normal finger-to-nose, normal heel-to-shin test  Gait: not assessed  CV: pulses palpable throughout     ASSESSMENT Rebecca Yoder is a 47 y.o. female presenting with aphasia. Imaging confirms a small nonhemorrhagic infarct posterior right external capsule and posterior right corona radiata. Infarct felt to be embolic secondary to PFO.  On aspirin 325 mg orally every day prior to admission. Now on clopidogrel 75 mg orally every day for secondary stroke prevention. Patient with clearing symptoms. Work up underway.   Hyperlipidemia, LDL 74 at goal on zocor.  Hypertension  Hospital day # 1  TREATMENT/PLAN  Continue clopidogrel 75 mg orally every day for secondary stroke prevention. Please schedule an outpatient TCD bubble study with emboli monitoring with Dr. Pearlean Brownie in 2 weeks. (I have added to discharge instruction sheet). In addition at that time further discussion of REDUCE Gore Helix PFO closure trial will be discussed. Dr. Pearlean Brownie discussed this with patient and sister. Follow up in stroke clinic in 2 weeks.  Gwendolyn Lima. Manson Passey, [facility], MBA, MHA [facility] Pager: [phone] 06/10/2013 4:29 PM  I have personally obtained a history, examined the patient, evaluated imaging results,  and formulated the assessment and plan of care. I agree with the above. Delia Heady, MD

## 2013-06-10 NOTE — Discharge Summary (Signed)
Physician Discharge Summary  PSALM ARMAN ZOX:[id number] DOB: [birth year] DOA: 06/09/2013  PCP: Rogelia Boga, MD  Admit date: 06/09/2013 Discharge date: 06/10/2013  Time spent: 35 minutes  Recommendations for Outpatient Follow-up:  1. Need to follow up with Dr Pearlean Brownie in 2 weeks for bubble study and discussion for treatment of PFO.  2. Needs to follow up with PCP for BP follow up. She needs better BP controlled.    Discharge Diagnoses:    Acute very small nonhemorrhagic infarct posterior right external    capsule and posterior right corona radiata.   HYPERLIPIDEMIA   HYPERTENSION   CEREBROVASCULAR ACCIDENT, HX OF   Hypokalemia   PFO (patent foramen ovale)   CVA (cerebral infarction)   Discharge Condition: Stable  Diet recommendation: Heart Healthy  Filed Weights   06/09/13 1503 06/09/13 1909  Weight: 83.008 kg (183 lb) 81.3 kg (179 lb 3.7 oz)    History of present illness:  Rebecca Yoder is a 47 y.o. female 47 y.o. female with history of right frontal CVA in 2008. Patient did have a TEE showing PFO and was followed up by [facility], but stroke was felt to be related to initiation of oral contraceptives. Hypercoagulable panel was negative. Patient states she awoke this AM, went to work and noted she was not feeling well. She went to the bathroom and noted she could not use her left arm and leg well and fell --this was at 10 AM. She called her supervisor who noted she was slurring her words and could not express herself. This last for about 20 minutes and she called 9-1-1. On EMS arrival no symptoms were noted. On arrival no symptoms were seen. While in ED twice nurse noted transient difficulty expressing herself, left facial droop and slurred speech. Currently she is hypertensive but showing no other symptoms.  While in ED she had another episode of left facial droop and dysarthria around 16:15 this would make a total of 4 episodes. The symptoms are the same every time.  Dysarthria, left facial droop, left arm numbness and weakness. Takes aspirin 325 mg daily   Hospital Course:  1- Acute very small nonhemorrhagic infarct posterior right external capsule and posterior right corona radiata. Patient with prior history of PFO. ECHO no evidence of thrombus. Unable to evaluate for PFO base on this results. Patient will follow up with Dr Pearlean Brownie for further test. She will be discharge on plavix. I will provide prescription for Norvasc for better BP controlled. Continue with statin. PT, OT to see patient.    Procedures: ECHO: Comparison to prior study March 2008. Mild LVH with LVEF 55-60%, grade 2 diastolic dysfunction. Mildly thickened mitral leaflets. Trivial tricuspid regurgitation, unable to assess PASP. Atrial septum is not well visualized. No diagnostic ASD or PFO based on available images. Records do indicate prior TEE diagnosis of PFO, possibly visualized with provocative maneuvers. Cannot locatedetails for review. Carotid doppler: Preliminary findings: Bilateral: 1-39% ICA stenosis. Vertebral artery flow is antegrade.     Consultations:  Dr Pearlean Brownie  Discharge Exam: Filed Vitals:   06/10/13 1000  BP: 168/77  Pulse: 83  Temp: 98.4 F (36.9 C)  Resp: 18    General: no distress.  Cardiovascular: S 1, S 2 RRR Respiratory: CTA  Discharge Instructions  Discharge Orders   Future Appointments Provider Department Dept Phone   06/13/2013 3:45 PM Gordy Savers, MD [facility] [place] [phone]   06/24/2013 9:15 AM [facility] [place] [phone]   07/01/2013  1:15 PM Gordy Savers, MD [facility] [place] [phone]   Future Orders Complete By Expires   Diet - low sodium heart healthy  As directed    Increase activity slowly  As directed        Medication List    STOP taking these medications       aspirin 325 MG tablet     meloxicam 15 MG tablet  Commonly known as:  MOBIC       TAKE these medications       acetaminophen 325 MG tablet  Commonly known as:  TYLENOL  Take 2 tablets (650 mg total) by mouth every 6 (six) hours as needed.     albuterol 108 (90 BASE) MCG/ACT inhaler  Commonly known as:  PROAIR HFA  Inhale 2 puffs into the lungs every 6 (six) hours as needed for wheezing.     amLODipine 2.5 MG tablet  Commonly known as:  NORVASC  Take 1 tablet (2.5 mg total) by mouth daily.     clopidogrel 75 MG tablet  Commonly known as:  PLAVIX  Take 1 tablet (75 mg total) by mouth daily with breakfast.     losartan 100 MG tablet  Commonly known as:  COZAAR  Take 1 tablet (100 mg total) by mouth daily.     multivitamin with minerals Tabs tablet  Take 1 tablet by mouth daily.     pravastatin 20 MG tablet  Commonly known as:  PRAVACHOL  Take 20 mg by mouth daily at 3 pm.       Allergies  Allergen Reactions  . Penicillins Rash       Follow-up Information   Follow up with Gates Rigg, MD In 2 weeks. (need to be seen in clinic in 2 weeks for TCD ultrasound and to discuss PFO closure trial)    Specialties:  Neurology, Radiology   Contact information:   [address] [place] [address] [phone]       Follow up with Rogelia Boga, MD In 1 week.   Specialty:  Internal Medicine   Contact information:   [address] [place] [address] [phone]        The results of significant diagnostics from this hospitalization (including imaging, microbiology, ancillary and laboratory) are listed below for reference.    Significant Diagnostic Studies: Ct Head Wo Contrast  06/09/2013   CLINICAL DATA:  Near syncopal episode, remote right MCA territory infarct  EXAM: CT HEAD WITHOUT CONTRAST  TECHNIQUE: Contiguous axial images were obtained from the base of the skull through the vertex without contrast.  COMPARISON:  10/22/2006  FINDINGS: Encephalomalacia in the right frontal lobe from a prior infarct. No  acute intracranial hemorrhage, definite new infarction, mass lesion, midline shift, herniation, hydrocephalus, or extra-axial fluid collection. Cisterns are patent. No cerebellar abnormality. Orbits are symmetric. Mastoids and sinuses are clear.  IMPRESSION: Remote right frontal infarct.  No acute finding   Electronically Signed   By: Ruel Favors M.D.   On: 06/09/2013 15:50   Mr Maxine Glenn Head Wo Contrast  06/09/2013   CLINICAL DATA:  Near syncope.  EXAM: MRI HEAD WITHOUT CONTRAST  MRA HEAD WITHOUT CONTRAST  TECHNIQUE: Multiplanar, multiecho pulse sequences of the brain and surrounding structures were obtained without intravenous contrast. Angiographic images of the head were obtained using MRA technique without contrast.  COMPARISON:  06/09/2013 head CT.  10/22/2006 MR.  FINDINGS: MRI HEAD FINDINGS  Some of the sequences are motion degraded.  Acute very small nonhemorrhagic infarct posterior right external capsule and posterior right corona radiata.  Remote moderate size right operculum the/basal ganglia/ subinsular infarct with encephalomalacia and subsequent dilation right lateral ventricle.  Questionable tiny remote infarct left caudate head.  No intracranial hemorrhage.  Mild atrophy without hydrocephalus.  Cervical medullary junction, pituitary region, pineal region and orbital structures unremarkable.  MRA HEAD FINDINGS  Marked motion degradation. This limits evaluating for detection of an aneurysm or grading stenosis accurately.  The possibility of the stenosis of proximal right middle cerebral artery branch vessels is raised.  Hypoplastic A1 segment right anterior cerebral artery.  Poor delineation of the right vertebral artery after the takeoff of the right posterior inferior cerebellar artery.  Fetal type contribution to the right posterior cerebral artery.  IMPRESSION: Acute very small nonhemorrhagic infarct posterior right external capsule and posterior right corona radiata.  Remote moderate size right  operculum the/basal ganglia/ subinsular infarct with encephalomalacia and subsequent dilation right lateral ventricle.  Marked motion degradation on the MR angiogram. This limits evaluating for detection of an aneurysm or grading stenosis accurately.  The possibility of stenosis of proximal right middle cerebral artery branch vessels is raised.  These results were called by telephone at the time of interpretation on 06/09/2013 at 7:08 PM to [facility] patient's nurse who verbally acknowledged these results.   Electronically Signed   By: Bridgett Larsson M.D.   On: 06/09/2013 19:09   Mr Brain Wo Contrast  06/09/2013   CLINICAL DATA:  Near syncope.  EXAM: MRI HEAD WITHOUT CONTRAST  MRA HEAD WITHOUT CONTRAST  TECHNIQUE: Multiplanar, multiecho pulse sequences of the brain and surrounding structures were obtained without intravenous contrast. Angiographic images of the head were obtained using MRA technique without contrast.  COMPARISON:  06/09/2013 head CT.  10/22/2006 MR.  FINDINGS: MRI HEAD FINDINGS  Some of the sequences are motion degraded.  Acute very small nonhemorrhagic infarct posterior right external capsule and posterior right corona radiata.  Remote moderate size right operculum the/basal ganglia/ subinsular infarct with encephalomalacia and subsequent dilation right lateral ventricle.  Questionable tiny remote infarct left caudate head.  No intracranial hemorrhage.  Mild atrophy without hydrocephalus.  Cervical medullary junction, pituitary region, pineal region and orbital structures unremarkable.  MRA HEAD FINDINGS  Marked motion degradation. This limits evaluating for detection of an aneurysm or grading stenosis accurately.  The possibility of the stenosis of proximal right middle cerebral artery branch vessels is raised.  Hypoplastic A1 segment right anterior cerebral artery.  Poor delineation of the right vertebral artery after the takeoff of the right posterior inferior cerebellar artery.  Fetal type  contribution to the right posterior cerebral artery.  IMPRESSION: Acute very small nonhemorrhagic infarct posterior right external capsule and posterior right corona radiata.  Remote moderate size right operculum the/basal ganglia/ subinsular infarct with encephalomalacia and subsequent dilation right lateral ventricle.  Marked motion degradation on the MR angiogram. This limits evaluating for detection of an aneurysm or grading stenosis accurately.  The possibility of stenosis of proximal right middle cerebral artery branch vessels is raised.  These results were called by telephone at the time of interpretation on 06/09/2013 at 7:08 PM to [facility] patient's nurse who verbally acknowledged these results.   Electronically Signed   By: Bridgett Larsson M.D.   On: 06/09/2013 19:09    Microbiology: No results found for this or any previous visit (from the past 240 hour(s)).   Labs: Basic Metabolic Panel:  Recent Labs Lab 06/09/13 1509 06/09/13 1534  06/10/13 0851  NA 138 142 137  K 3.7 3.2* 3.9  CL 102 104 104  CO2 25  --  22  GLUCOSE 103* 94 95  BUN 14 15 9   CREATININE 0.78 1.00 0.74  CALCIUM 9.4  --  9.0   Liver Function Tests:  Recent Labs Lab 06/09/13 1509  AST 24  ALT 26  ALKPHOS 60  BILITOT 0.1*  PROT 7.7  ALBUMIN 4.1   No results found for this basename: LIPASE, AMYLASE,  in the last 168 hours No results found for this basename: AMMONIA,  in the last 168 hours CBC:  Recent Labs Lab 06/09/13 1509 06/09/13 1534  WBC 6.3  --   NEUTROABS 2.1  --   HGB 13.1 12.9  HCT 37.7 38.0  MCV 86.5  --   PLT 328  --    Cardiac Enzymes:  Recent Labs Lab 06/09/13 1509  TROPONINI <0.30   BNP: BNP (last 3 results) No results found for this basename: PROBNP,  in the last 8760 hours CBG:  Recent Labs Lab 06/09/13 1532  GLUCAP 89       Signed:  [name]  [facility] 06/10/2013, 1:59 PM

## 2013-06-10 NOTE — Evaluation (Signed)
Occupational Therapy Evaluation Patient Details Name: EILA RUNYAN MRN: [id number] DOB: May 13, 1966 Today's Date: 06/10/2013 Time: 4098-1191 OT Time Calculation (min): 16 min  OT Assessment / Plan / Recommendation History of present illness 47 y.o. female admitted to [facility] on 1030/14 with history of right frontal CVA in 2008, and presents this time with left arm and leg weakness and slurred speech.  MRI revealed posterior right external capsule and posterior right corona radiata acute, nonhemorrhagic infarcts.     Clinical Impression   Patient evaluated by Occupational Therapy with no further acute OT needs identified. All education has been completed and the patient has no further questions. Pt demonstrates very mild visual deficits - decreased speed of saccades and mild Lt. Gaze stability.  Pt provided with HEP and improvement noted after pt performed.  Feel deficits will further improve on their own, and she is aware of activities to perform.  See below for any follow-up Occupational Therapy or equipment needs. OT is signing off. Thank you for this referral.     OT Assessment  Patient does not need any further OT services    Follow Up Recommendations  No OT follow up    Barriers to Discharge      Equipment Recommendations  None recommended by OT    Recommendations for Other Services    Frequency       Precautions / Restrictions     Pertinent Vitals/Pain     ADL  Eating/Feeding: Independent Where Assessed - Eating/Feeding: Edge of bed;Chair Grooming: Wash/dry hands;Wash/dry face;Teeth care;Brushing hair;Independent Where Assessed - Grooming: Unsupported standing Upper Body Bathing: Set up Where Assessed - Upper Body Bathing: Unsupported sitting Lower Body Bathing: Set up Where Assessed - Lower Body Bathing: Unsupported sit to stand Upper Body Dressing: Set up Where Assessed - Upper Body Dressing: Unsupported sitting Lower Body Dressing: Set up Where Assessed - Lower  Body Dressing: Unsupported sit to stand Toilet Transfer: Independent Toilet Transfer Method: Sit to stand;Stand pivot Acupuncturist: Regular height toilet Toileting - Clothing Manipulation and Hygiene: Independent Where Assessed - Glass blower/designer Manipulation and Hygiene: Standing Transfers/Ambulation Related to ADLs: I ADL Comments: Pt is modified independent to independent with all BADLs    OT Diagnosis:    OT Problem List:   OT Treatment Interventions:     OT Goals(Current goals can be found in the care plan section) Acute Rehab OT Goals Patient Stated Goal: to take a little time off and then go back to work and her normal activites, to start her walking program again.    Visit Information  Last OT Received On: 06/10/13 Assistance Needed: +1 History of Present Illness: 47 y.o. female admitted to [facility] on 1030/14 with history of right frontal CVA in 2008, and presents this time with left arm and leg weakness and slurred speech.  MRI revealed posterior right external capsule and posterior right corona radiata acute, nonhemorrhagic infarcts.         Prior Functioning     Home Living Family/patient expects to be discharged to:: Private residence Living Arrangements: Parent (pt is the primary caregiver for her mom who has cancer.  ) Available Help at Discharge: Family;Available PRN/intermittently Type of Home: House Home Access: Ramped entrance Home Layout: One level Home Equipment: Cane - single point Additional Comments: Per pt her mom is pretty mobile, uses a RW.  Pt helps with cooking and cleaning and her sister comes in and helps as well.   Prior Function Level of Independence: Independent Comments:  drives, works swing shift as a Ecologist: No difficulties;Other (comment) (no obvious difficulties) Dominant Hand: Right         Vision/Perception Vision - History Baseline Vision: Wears glasses all the time Patient Visual  Report: No change from baseline Vision - Assessment Eye Alignment: Within Functional Limits Vision Assessment: Vision tested Ocular Range of Motion: Within Functional Limits Tracking/Visual Pursuits: Other (comment) (mild jerkining with lt gaze) Saccades: Decreased speed of saccadic movement Visual Fields: No apparent deficits Additional Comments: Pt is very aware of saccades being slow - she initiated that comment.  Pt also with mild gaze insatability and reports mild dizziness 2/10.  Pt insructed and performed gaze stabilization exercises with improvement of symptoms.  Pt also instructed to play computer games upon discharge to improve speed of saccades. She verbalized demonstration of all.  Perception Perception: Within Functional Limits Praxis Praxis: Intact   Cognition  Cognition Arousal/Alertness: Awake/alert Behavior During Therapy: WFL for tasks assessed/performed Overall Cognitive Status: Within Functional Limits for tasks assessed    Extremity/Trunk Assessment Upper Extremity Assessment Upper Extremity Assessment: Overall WFL for tasks assessed Lower Extremity Assessment Lower Extremity Assessment: Defer to PT evaluation Cervical / Trunk Assessment Cervical / Trunk Assessment: Normal     Mobility Bed Mobility Bed Mobility: Supine to Sit;Sitting - Scoot to Edge of Bed Supine to Sit: 7: Independent Sitting - Scoot to Edge of Bed: 7: Independent Transfers Transfers: Sit to Stand;Stand to Sit Sit to Stand: 6: Modified independent (Device/Increase time);With upper extremity assist;From bed Stand to Sit: 6: Modified independent (Device/Increase time);With upper extremity assist;To bed Details for Transfer Assistance: used hands for stability during transitions.       Exercise     Balance Balance Balance Assessed: Yes Static Sitting Balance Static Sitting - Balance Support: No upper extremity supported;Feet supported Static Sitting - Level of Assistance: 7:  Independent Static Standing Balance Static Standing - Balance Support: No upper extremity supported Static Standing - Level of Assistance: 7: Independent Dynamic Standing Balance Dynamic Standing - Balance Support: Right upper extremity supported Dynamic Standing - Level of Assistance: 5: Stand by assistance Dynamic Standing - Comments: supervision during gait, pt reaching for one upper extremity support.  Advised pt to use cane if she felt she needed the support, especially the first time she walked outside.   High Level Balance High Level Balance Activites: Direction changes;Turns High Level Balance Comments: supervision, slow speed, at times right upper extremity support   End of Session OT - End of Session Activity Tolerance: Patient tolerated treatment well Patient left: in bed;with call bell/phone within reach Nurse Communication: Mobility status  GO     Gearlene Godsil, Ursula Alert M 06/10/2013, 3:10 PM

## 2013-06-10 NOTE — Evaluation (Signed)
Speech Language Pathology Evaluation Patient Details Name: Rebecca Yoder MRN: [id number] DOB: March 14, 1966 Today's Date: 06/10/2013 Time: 4098-1191 SLP Time Calculation (min): 17 min  Problem List:  Patient Active Problem List   Diagnosis Date Noted  . TIA (transient ischemic attack) 06/09/2013  . Hypokalemia 06/09/2013  . PFO (patent foramen ovale) 06/09/2013  . CVA (cerebral infarction) 06/09/2013  . HYPERLIPIDEMIA 12/10/2009  . HYPERTENSION 12/10/2009  . ASTHMA 12/10/2009  . CEREBROVASCULAR ACCIDENT, HX OF 12/10/2009   Past Medical History:  Past Medical History  Diagnosis Date  . ASTHMA 12/10/2009  . CEREBROVASCULAR ACCIDENT, HX OF 12/10/2009  . HYPERLIPIDEMIA 12/10/2009  . HYPERTENSION 12/10/2009  . Complication of anesthesia     "just can't get me up" (06/09/2013)  . Stroke 2008    denies residual on 06/09/2013  . Tendonitis of wrist, right    Past Surgical History:  Past Surgical History  Procedure Laterality Date  . Ovarian cyst removal  1990's   HPI:  47 y.o. female admitted to [facility] on 1030/14 with history of right frontal CVA in 2008, and presents this time with left arm and leg weakness and slurred speech.  MRI revealed posterior right external capsule and posterior right corona radiata acute, nonhemorrhagic infarcts.   Assessment / Plan / Recommendation Clinical Impression  Cognitive-linguistic evaluation completed.  Patient present with a WFL oral mech. exam.  She also demonstrates [facility] speech intelligibility.  Her ability to follow directions, awareness of assistance needed following discharge and use of memory aids during evaluation were all unremarkable.  Given that previous noted symptoms have resolved and patient is at baseline function no further skilled SLP services are warranted at this time.  SLP signing off no recommended follow-up needed at this time.     SLP Assessment  Patient does not need any further Speech Lanaguage Pathology Services    Follow Up  Recommendations  None    Frequency and Duration N/A      Pertinent Vitals/Pain None    SLP Evaluation Prior Functioning  Cognitive/Linguistic Baseline: Within functional limits Type of Home: House Available Help at Discharge: Family;Available PRN/intermittently Vocation: Full time employment   Cognition  Overall Cognitive Status: Within Functional Limits for tasks assessed Orientation Level: Oriented X4 Safety/Judgment: Appears intact    Comprehension  Auditory Comprehension Overall Auditory Comprehension: Appears within functional limits for tasks assessed Visual Recognition/Discrimination Discrimination: Within Function Limits Reading Comprehension Reading Status: Within funtional limits    Expression Expression Primary Mode of Expression: Verbal Verbal Expression Overall Verbal Expression: Appears within functional limits for tasks assessed Written Expression Dominant Hand: Right Written Expression: Not tested   Oral / Motor Oral Motor/Sensory Function Overall Oral Motor/Sensory Function: Appears within functional limits for tasks assessed Motor Speech Overall Motor Speech: Appears within functional limits for tasks assessed   GO     Charlane Ferretti., CCC-SLP [phone]  Rebecca Yoder 06/10/2013, 3:28 PM

## 2013-06-10 NOTE — Evaluation (Signed)
Physical Therapy Evaluation Patient Details Name: Rebecca Yoder MRN: [id number] DOB: 02/07/1966 Today's Date: 06/10/2013 Time: 8295-6213 PT Time Calculation (min): 10 min  PT Assessment / Plan / Recommendation History of Present Illness  47 y.o. female admitted to [facility] on 1030/14 with history of right frontal CVA in 2008, and presents this time with left arm and leg weakness and slurred speech.  MRI revealed posterior right external capsule and posterior right corona radiata acute, nonhemorrhagic infarcts.    Clinical Impression  Pt is moving well, strength seems equal throughout.  Gait in the hallway revealed some very mild staggering and pt reports feeling "a little off balance", but she is able to compensate well and may use her cane the first few days home.  I anticipate that once she is back home and back in her normal routine that the balance will clear up.  If not, I told her to tell the neuro MD at her f/u and he could set her up with OP PT.  PT to sign off.  The pt is physically ok for d/c home.      PT Assessment  Patent does not need any further PT services    Follow Up Recommendations  No PT follow up;Supervision - Intermittent    Does the patient have the potential to tolerate intense rehabilitation     NA  Barriers to Discharge   None      Equipment Recommendations  None recommended by PT    Recommendations for Other Services   None  Frequency   NA- one time eval and d/c   Precautions / Restrictions   None  Pertinent Vitals/Pain See vitals flow sheet.       Mobility  Bed Mobility Bed Mobility: Supine to Sit;Sitting - Scoot to Edge of Bed Supine to Sit: 7: Independent Sitting - Scoot to Edge of Bed: 7: Independent Transfers Transfers: Sit to Stand;Stand to Sit Sit to Stand: 6: Modified independent (Device/Increase time);With upper extremity assist;From bed Stand to Sit: 6: Modified independent (Device/Increase time);With upper extremity assist;To  bed Details for Transfer Assistance: used hands for stability during transitions.   Ambulation/Gait Ambulation/Gait Assistance: 5: Supervision Ambulation Distance (Feet): 120 Feet Assistive device: Other (Comment) (at times reaching for hallway railing.  ) Ambulation/Gait Assistance Details: supervision due to pt self report that she feels "just a little off balance" Gait Pattern: Step-through pattern (mildly staggering gait pattern, pt is able to compensate ) Gait velocity: decreased, "i"m taking it slow and being careful"       PT Goals(Current goals can be found in the care plan section) Acute Rehab PT Goals Patient Stated Goal: to take a little time off and then go back to work and her normal activites, to start her walking program again.   PT Goal Formulation: No goals set, d/c therapy  Visit Information  Last PT Received On: 06/10/13 Assistance Needed: +1 History of Present Illness: 47 y.o. female admitted to [facility] on 1030/14 with history of right frontal CVA in 2008, and presents this time with left arm and leg weakness and slurred speech.  MRI revealed posterior right external capsule and posterior right corona radiata acute, nonhemorrhagic infarcts.         Prior Functioning  Home Living Family/patient expects to be discharged to:: Private residence Living Arrangements: Parent (pt is the primary caregiver for her mom who has cancer.  ) Available Help at Discharge: Family;Available PRN/intermittently Type of Home: House Home Access: Ramped entrance Home Layout: One  level Home Equipment: Cane - single point Additional Comments: Per pt her mom is pretty mobile, uses a RW.  Pt helps with cooking and cleaning and her sister comes in and helps as well.   Prior Function Level of Independence: Independent Comments: drives, works swing shift as a Ecologist: No difficulties;Other (comment) (no obvious difficulties) Dominant Hand: Right     Cognition  Cognition Arousal/Alertness: Awake/alert Behavior During Therapy: WFL for tasks assessed/performed Overall Cognitive Status: Within Functional Limits for tasks assessed    Extremity/Trunk Assessment Upper Extremity Assessment Upper Extremity Assessment: Overall WFL for tasks assessed Lower Extremity Assessment Lower Extremity Assessment: Overall WFL for tasks assessed (5/5 per MMT seated EOB) Cervical / Trunk Assessment Cervical / Trunk Assessment: Normal   Balance Balance Balance Assessed: Yes Static Sitting Balance Static Sitting - Balance Support: No upper extremity supported;Feet supported Static Sitting - Level of Assistance: 7: Independent Static Standing Balance Static Standing - Balance Support: No upper extremity supported Static Standing - Level of Assistance: 7: Independent Dynamic Standing Balance Dynamic Standing - Balance Support: Right upper extremity supported Dynamic Standing - Level of Assistance: 5: Stand by assistance Dynamic Standing - Comments: supervision during gait, pt reaching for one upper extremity support.  Advised pt to use cane if she felt she needed the support, especially the first time she walked outside.   High Level Balance High Level Balance Activites: Direction changes;Turns High Level Balance Comments: supervision, slow speed, at times right upper extremity support  End of Session PT - End of Session Activity Tolerance: Patient tolerated treatment well Patient left: in bed;Other (comment) (seated EOB with OT)  GP Functional Assessment Tool Used: assist levels Functional Limitation: Mobility: Walking and moving around Mobility: Walking and Moving Around Current Status (W1027): At least 1 percent but less than 20 percent impaired, limited or restricted Mobility: Walking and Moving Around Goal Status [id number]): At least 1 percent but less than 20 percent impaired, limited or restricted Mobility: Walking and Moving Around Discharge  Status [phone]): At least 1 percent but less than 20 percent impaired, limited or restricted   Shenoa Hattabaugh B. Gennifer Potenza, PT, DPT [phone]   06/10/2013, 2:52 PM

## 2013-06-10 NOTE — Progress Notes (Signed)
  Echocardiogram 2D Echocardiogram has been performed by Nolon Rod.  Sohana Austell FRANCES 06/10/2013, 10:06 AM

## 2013-06-13 ENCOUNTER — Telehealth: Payer: Self-pay | Admitting: Neurology

## 2013-06-13 ENCOUNTER — Ambulatory Visit (INDEPENDENT_AMBULATORY_CARE_PROVIDER_SITE_OTHER): Payer: PRIVATE HEALTH INSURANCE | Admitting: Internal Medicine

## 2013-06-13 ENCOUNTER — Encounter: Payer: Self-pay | Admitting: Internal Medicine

## 2013-06-13 VITALS — BP 128/82 | HR 104 | Temp 98.3°F | Resp 18 | Wt 182.0 lb

## 2013-06-13 DIAGNOSIS — I639 Cerebral infarction, unspecified: Secondary | ICD-10-CM

## 2013-06-13 DIAGNOSIS — I10 Essential (primary) hypertension: Secondary | ICD-10-CM

## 2013-06-13 DIAGNOSIS — I6350 Cerebral infarction due to unspecified occlusion or stenosis of unspecified cerebral artery: Secondary | ICD-10-CM

## 2013-06-13 DIAGNOSIS — E785 Hyperlipidemia, unspecified: Secondary | ICD-10-CM

## 2013-06-13 NOTE — Progress Notes (Signed)
Occupational Therapy Evaluation Addendum   07-04-2013 1500  OT G-codes **NOT FOR INPATIENT CLASS**  Functional Limitation Self care  Self Care Current Status (W0981) CI  Self Care Goal Status (X9147) CI  Self Care Discharge Status [phone]) CI   Jeani Hawking, OTR/L [phone]

## 2013-06-13 NOTE — Progress Notes (Signed)
Subjective:    Patient ID: Rebecca Yoder, female    DOB: July 14, 1966, 47 y.o.   MRN: [id number]  HPI  47 year old patient who was recently discharged from the hospital 3 days ago following a recurrent nonhemorrhagic infarct. Since her discharge she is doing quite well and continues to improve. She feels that her gait is a bit unsteady but otherwise doing quite well. She has been monitoring home blood pressure readings with nice results.  She is scheduled for neurology followup to discuss a PFO closure trial  Discharge Diagnoses:  Acute very small nonhemorrhagic infarct posterior right external  capsule and posterior right corona radiata.  HYPERLIPIDEMIA  HYPERTENSION  CEREBROVASCULAR ACCIDENT, HX OF  Hypokalemia  PFO (patent foramen ovale)  CVA (cerebral infarction)   Past Medical History  Diagnosis Date  . ASTHMA 12/10/2009  . CEREBROVASCULAR ACCIDENT, HX OF 12/10/2009  . HYPERLIPIDEMIA 12/10/2009  . HYPERTENSION 12/10/2009  . Complication of anesthesia     "just can't get me up" (06/09/2013)  . Stroke 2008    denies residual on 06/09/2013  . Tendonitis of wrist, right     History   Social History  . Marital Status: Single    Spouse Name: N/A    Number of Children: N/A  . Years of Education: N/A   Occupational History  . Not on file.   Social History Main Topics  . Smoking status: Never Smoker   . Smokeless tobacco: Never Used  . Alcohol Use: Yes     Comment: 06/09/2013 "might drink a couple times/yr"  . Drug Use: No  . Sexual Activity: Yes   Other Topics Concern  . Not on file   Social History Narrative  . No narrative on file    Past Surgical History  Procedure Laterality Date  . Ovarian cyst removal  1990's    No family history on file.  Allergies  Allergen Reactions  . Penicillins Rash    Current Outpatient Prescriptions on File Prior to Visit  Medication Sig Dispense Refill  . acetaminophen (TYLENOL) 325 MG tablet Take 2 tablets (650 mg total)  by mouth every 6 (six) hours as needed.  20 tablet  0  . albuterol (PROAIR HFA) 108 (90 BASE) MCG/ACT inhaler Inhale 2 puffs into the lungs every 6 (six) hours as needed for wheezing.  9 g  5  . amLODipine (NORVASC) 2.5 MG tablet Take 1 tablet (2.5 mg total) by mouth daily.  30 tablet  0  . clopidogrel (PLAVIX) 75 MG tablet Take 1 tablet (75 mg total) by mouth daily with breakfast.  30 tablet  0  . losartan (COZAAR) 100 MG tablet Take 1 tablet (100 mg total) by mouth daily.  90 tablet  3  . Multiple Vitamin (MULTIVITAMIN WITH MINERALS) TABS tablet Take 1 tablet by mouth daily.      . pravastatin (PRAVACHOL) 20 MG tablet Take 20 mg by mouth daily at 3 pm.       No current facility-administered medications on file prior to visit.    BP 128/82  Pulse 104  Temp(Src) 98.3 F (36.8 C) (Oral)  Resp 18  Wt 182 lb (82.555 kg)  SpO2 97%  LMP 05/20/2013    Review of Systems  Constitutional: Negative.   HENT: Negative for congestion, dental problem, hearing loss, rhinorrhea, sinus pressure, sore throat and tinnitus.   Eyes: Negative for pain, discharge and visual disturbance.  Respiratory: Negative for cough and shortness of breath.   Cardiovascular: Negative for  chest pain, palpitations and leg swelling.  Gastrointestinal: Negative for nausea, vomiting, abdominal pain, diarrhea, constipation, blood in stool and abdominal distention.  Genitourinary: Negative for dysuria, urgency, frequency, hematuria, flank pain, vaginal bleeding, vaginal discharge, difficulty urinating, vaginal pain and pelvic pain.  Musculoskeletal: Positive for gait problem. Negative for arthralgias and joint swelling.  Skin: Negative for rash.  Neurological: Negative for dizziness, syncope, speech difficulty, weakness, numbness and headaches.  Hematological: Negative for adenopathy.  Psychiatric/Behavioral: Negative for behavioral problems, dysphoric mood and agitation. The patient is not nervous/anxious.         Objective:   Physical Exam  Constitutional: She is oriented to person, place, and time. She appears well-developed and well-nourished.  HENT:  Head: Normocephalic.  Right Ear: External ear normal.  Left Ear: External ear normal.  Mouth/Throat: Oropharynx is clear and moist.  Eyes: Conjunctivae and EOM are normal. Pupils are equal, round, and reactive to light.  Neck: Normal range of motion. Neck supple. No thyromegaly present.  Cardiovascular: Normal rate, regular rhythm, normal heart sounds and intact distal pulses.   Pulmonary/Chest: Effort normal and breath sounds normal.  Abdominal: Soft. Bowel sounds are normal. She exhibits no mass. There is no tenderness.  Musculoskeletal: Normal range of motion.  Lymphadenopathy:    She has no cervical adenopathy.  Neurological: She is alert and oriented to person, place, and time. No cranial nerve deficit. Coordination normal.  Unable to do a tandem walk well but otherwise exam unremarkable  Skin: Skin is warm and dry. No rash noted.  Psychiatric: She has a normal mood and affect. Her behavior is normal.          Assessment & Plan:   Status post recurrent nonhemorrhagic infarct Hypertension well controlled Dyslipidemia History of PFO   Neurology followup as scheduled Return here in 3 months for followup Continue close home blood pressure monitoring

## 2013-06-13 NOTE — Patient Instructions (Signed)
Limit your sodium (Salt) intake  Please check your blood pressure on a regular basis.  If it is consistently greater than 150/90, please make an office appointment.    It is important that you exercise regularly, at least 20 minutes 3 to 4 times per week.  If you develop chest pain or shortness of breath seek  medical attention.  Neurology followup as scheduled

## 2013-06-13 NOTE — Telephone Encounter (Signed)
Spoke with patient and informed that she will be placed on cancellation list for a sooner appt. She understood.

## 2013-06-24 ENCOUNTER — Telehealth: Payer: Self-pay | Admitting: Internal Medicine

## 2013-06-24 ENCOUNTER — Other Ambulatory Visit: Payer: Self-pay | Admitting: Neurology

## 2013-06-24 ENCOUNTER — Other Ambulatory Visit: Payer: 59

## 2013-06-24 ENCOUNTER — Telehealth: Payer: Self-pay | Admitting: Neurology

## 2013-06-24 DIAGNOSIS — I6350 Cerebral infarction due to unspecified occlusion or stenosis of unspecified cerebral artery: Secondary | ICD-10-CM

## 2013-06-24 NOTE — Telephone Encounter (Signed)
Patient Information:  Caller Name: Deaira  Phone: [phone]  Patient: Rebecca Yoder  Gender: Female  DOB: Aug 21, 1965  Age: 47 Years  PCP: Eleonore Chiquito (Family Practice > 49yrs old)  Pregnant: No  Office Follow Up:  Does the office need to follow up with this patient?: Yes  Instructions For The Office: Please review - patient asking if Plavix needs to be changed since she had blood tested at lab and results was l1.0.  Also please address whether ths patient is to be taking Aspirin for discomfort or another medicine.  Patient can be reached at work  [phone].   Symptoms  Reason For Call & Symptoms: Headache to 4/10 only at intervals starting 2 days ago.  Headache across forhead, sometimes head feels thick.  Is at work now with only slight headache. LMP 06/20/2013.   Had stroke 10/30, is on Plavix.  Had blood checked at lab when took her Mother for tests and patient's results to check blood thinning was 1.0.  Asking if needs to be on another medicine.  Reviewed Health History In EMR: Yes  Reviewed Medications In EMR: Yes  Reviewed Allergies In EMR: Yes  Reviewed Surgeries / Procedures: Yes  Date of Onset of Symptoms: 06/22/2013  Treatments Tried: Aspirin 325mg  3 tabs at HS 11/13  Treatments Tried Worked: Yes OB / GYN:  LMP: 06/20/2013  Guideline(s) Used:  Headache  Disposition Per Guideline:   Home Care  Reason For Disposition Reached:   Mild-moderate headache  Advice Given:  Rest:   Lie down in a dark, quiet place and try to relax. Close your eyes and imagine your entire body relaxing.  Apply Cold to the Area:   Apply a cold wet washcloth or cold pack to the forehead for 20 minutes.  Call Back If:  Headache lasts longer than 24 hours  You become worse.  Patient Will Follow Care Advice:  YES

## 2013-06-29 ENCOUNTER — Ambulatory Visit: Payer: Self-pay | Admitting: Family

## 2013-06-29 ENCOUNTER — Telehealth: Payer: Self-pay | Admitting: Internal Medicine

## 2013-06-29 NOTE — Telephone Encounter (Signed)
Called patient and left message that she contact her primary physician concerning her coumadin level and if she has any other problem, questions or concerns to call the office.

## 2013-06-29 NOTE — Telephone Encounter (Signed)
Patient Information:  Caller Name: Davene  Phone: [phone]  Patient: Rebecca Yoder, Carrillo  Gender: Female  DOB: 03-09-1966  Age: 47 Years  PCP: Eleonore Chiquito (Family Practice > 36yrs old)  Pregnant: No  Office Follow Up:  Does the office need to follow up with this patient?: Yes  Instructions For The Office: Patient needs a call back with further instructions.  RN Note:  Patient reports since she has been taking Coumadin she has not had her INR checked. Please contact patient with further instructions regarding Coumadin dosing and testing.  Symptoms  Reason For Call & Symptoms: Headaches; INR was checked at a local clinic on 06/23/13, result was 1.0.  Reviewed Health History In EMR: Yes  Reviewed Medications In EMR: Yes  Reviewed Allergies In EMR: Yes  Reviewed Surgeries / Procedures: Yes  Date of Onset of Symptoms: 06/23/2013 OB / GYN:  LMP: 06/19/2013  Guideline(s) Used:  No Protocol Available - Information Only  Disposition Per Guideline:   Discuss with PCP and Callback by Nurse within 1 Hour  Reason For Disposition Reached:   Nursing judgment  Advice Given:  N/A  Patient Will Follow Care Advice:  YES

## 2013-06-29 NOTE — Telephone Encounter (Signed)
Please advise, Dr. Kirtland Bouchard is gone for the day.

## 2013-06-29 NOTE — Telephone Encounter (Signed)
Advise patient to come in and have her INR checked today. We will adjust accordingly

## 2013-06-30 ENCOUNTER — Ambulatory Visit (INDEPENDENT_AMBULATORY_CARE_PROVIDER_SITE_OTHER): Payer: PRIVATE HEALTH INSURANCE

## 2013-06-30 ENCOUNTER — Ambulatory Visit (INDEPENDENT_AMBULATORY_CARE_PROVIDER_SITE_OTHER): Payer: PRIVATE HEALTH INSURANCE | Admitting: Neurology

## 2013-06-30 ENCOUNTER — Encounter: Payer: Self-pay | Admitting: Family

## 2013-06-30 ENCOUNTER — Telehealth: Payer: Self-pay | Admitting: Internal Medicine

## 2013-06-30 ENCOUNTER — Ambulatory Visit (INDEPENDENT_AMBULATORY_CARE_PROVIDER_SITE_OTHER): Payer: PRIVATE HEALTH INSURANCE | Admitting: Family

## 2013-06-30 VITALS — BP 138/94 | HR 96

## 2013-06-30 VITALS — BP 142/88 | HR 63 | Wt 184.0 lb

## 2013-06-30 DIAGNOSIS — R51 Headache: Secondary | ICD-10-CM

## 2013-06-30 DIAGNOSIS — I6350 Cerebral infarction due to unspecified occlusion or stenosis of unspecified cerebral artery: Secondary | ICD-10-CM

## 2013-06-30 DIAGNOSIS — Z0289 Encounter for other administrative examinations: Secondary | ICD-10-CM

## 2013-06-30 DIAGNOSIS — I10 Essential (primary) hypertension: Secondary | ICD-10-CM

## 2013-06-30 MED ORDER — ASPIRIN-DIPYRIDAMOLE ER 25-200 MG PO CP12
1.0000 | ORAL_CAPSULE | Freq: Two times a day (BID) | ORAL | Status: DC
Start: 2013-06-30 — End: 2013-06-30

## 2013-06-30 MED ORDER — AMLODIPINE BESYLATE 2.5 MG PO TABS: 2.5000 mg | ORAL_TABLET | Freq: Every day | ORAL | Status: DC

## 2013-06-30 NOTE — Progress Notes (Signed)
Pre visit review using our clinic review tool, if applicable. No additional management support is needed unless otherwise documented below in the visit note. 

## 2013-06-30 NOTE — Patient Instructions (Signed)
Stroke Prevention Some medical conditions and behaviors are associated with an increased chance of having a stroke. You may prevent a stroke by making healthy choices and managing medical conditions. Reduce your risk of having a stroke by:  Staying physically active. Get at least 30 minutes of activity on most or all days.  Not smoking. It may also be helpful to avoid exposure to secondhand smoke.  Limiting alcohol use. Moderate alcohol use is considered to be:  No more than 2 drinks per day for men.  No more than 1 drink per day for nonpregnant women.  Eating healthy foods.  Include 5 or more servings of fruits and vegetables a day.  Certain diets may be prescribed to address high blood pressure, high cholesterol, diabetes, or obesity.  Managing your cholesterol levels.  A low-saturated fat, low-trans fat, low-cholesterol, and high-fiber diet may control cholesterol levels.  Take any prescribed medicines to control cholesterol as directed by your caregiver.  Managing your diabetes.  A controlled-carbohydrate, controlled-sugar diet is recommended to manage diabetes.  Take any prescribed medicines to control diabetes as directed by your caregiver.  Controlling your high blood pressure (hypertension).  A low-salt (sodium), low-saturated fat, low-trans fat, and low-cholesterol diet is recommended to manage high blood pressure.  Take any prescribed medicines to control hypertension as directed by your caregiver.  Maintaining a healthy weight.  A reduced-calorie, low-sodium, low-saturated fat, low-trans fat, low-cholesterol diet is recommended to manage weight.  Stopping drug abuse.  Avoiding birth control pills.  Talk to your caregiver about the risks of taking birth control pills if you are over 35 years old, smoke, get migraines, or have ever had a blood clot.  Getting evaluated for sleep disorders (sleep apnea).  Talk to your caregiver about getting a sleep evaluation  if you snore a lot or have excessive sleepiness.  Taking medicines as directed by your caregiver.  For some people, aspirin or blood thinners (anticoagulants) are helpful in reducing the risk of forming abnormal blood clots that can lead to stroke. If you have the irregular heart rhythm of atrial fibrillation, you should be on a blood thinner unless there is a good reason you cannot take them.  Understand all your medicine instructions. SEEK IMMEDIATE MEDICAL CARE IF:   You have sudden weakness or numbness of the face, arm, or leg, especially on one side of the body.  You have sudden confusion.  You have trouble speaking (aphasia) or understanding.  You have sudden trouble seeing in one or both eyes.  You have sudden trouble walking.  You have dizziness.  You have a loss of balance or coordination.  You have a sudden, severe headache with no known cause.  You have new chest pain or an irregular heartbeat. Any of these symptoms may represent a serious problem that is an emergency. Do not wait to see if the symptoms will go away. Get medical help right away. Call your local emergency services (911 in [place] Do not drive yourself to the hospital. Document Released: 09/04/2004 Document Revised: 10/20/2011 Document Reviewed: 01/28/2013 [employer] Patient Information 2014 [employer]  

## 2013-06-30 NOTE — Telephone Encounter (Signed)
Please call pt and schedule lab appointment today for INR per Padonda. Thanks

## 2013-06-30 NOTE — Telephone Encounter (Signed)
agree

## 2013-06-30 NOTE — Telephone Encounter (Signed)
lmom for pt to call back

## 2013-06-30 NOTE — Progress Notes (Signed)
[facility] [address] [place]. [place] [address] [phone]       OFFICE FOLLOW-UP NOTE  [name] Date of Birth:  [birth year] Medical Record Number:  [id number]   HPI: 47 year lady with right brain subcortical infarct in October 2014 as well as remote right frontal insular infarct of cryptogenic etiology. Questionable small left caudate lacunar infarct. Vascular risk factors of Hypertension and Hyperlipidimia.Questionable PFO on TEE in 2008 but not seen on TCD Bubble study. Update 06/29/13 :She is seen for first office f/u after [facility] admission for stroke on 06/09/13. She developed left sided weakness at work and slurred speech.Symptoms were improving by the time she reached the hospital.Ct head showed remote right frontal infarct while MRI showed small right external capsule and right corona radiata infarct.questionable tiny left caudate infarct.Transthoraxic echo showed normal ejection fraction and carotid dopplers showed no significant stenosis.MRA brain was suboptimal with motion artefacts.Lipid profile was normal.She was started on clopidogrel but is having trouble affording it. She has done well without any recurrent symptoms and feels left sided strength is back to normal except mild diminished fine motor skills.She had extensive stroke evaluation in 2008 at time of her right frontal infarct including TEE showing small PFO but TCD Bubble study was negative.  ROS:   14 system review of systems is positive for ringing in ears,cough,snoring,joint swelling,numbness,headache,sleepiness  PMH:  Past Medical History  Diagnosis Date  . ASTHMA 12/10/2009  . CEREBROVASCULAR ACCIDENT, HX OF 12/10/2009  . HYPERLIPIDEMIA 12/10/2009  . HYPERTENSION 12/10/2009  . Complication of anesthesia     "just can't get me up" (06/09/2013)  . Stroke 2008    denies residual on 06/09/2013  . Tendonitis of wrist, right     Social History:  History   Social History  . Marital  Status: Single    Spouse Name: N/A    Number of Children: N/A  . Years of Education: N/A   Occupational History  . Not on file.   Social History Main Topics  . Smoking status: Never Smoker   . Smokeless tobacco: Never Used  . Alcohol Use: Yes     Comment: 06/09/2013 "might drink a couple times/yr"  . Drug Use: No  . Sexual Activity: Yes   Other Topics Concern  . Not on file   Social History Narrative  . No narrative on file    Medications:   Current Outpatient Prescriptions on File Prior to Visit  Medication Sig Dispense Refill  . acetaminophen (TYLENOL) 325 MG tablet Take 2 tablets (650 mg total) by mouth every 6 (six) hours as needed.  20 tablet  0  . albuterol (PROAIR HFA) 108 (90 BASE) MCG/ACT inhaler Inhale 2 puffs into the lungs every 6 (six) hours as needed for wheezing.  9 g  5  . losartan (COZAAR) 100 MG tablet Take 1 tablet (100 mg total) by mouth daily.  90 tablet  3  . Multiple Vitamin (MULTIVITAMIN WITH MINERALS) TABS tablet Take 1 tablet by mouth daily.      . pravastatin (PRAVACHOL) 20 MG tablet Take 20 mg by mouth daily at 3 pm.       No current facility-administered medications on file prior to visit.    Allergies:   Allergies  Allergen Reactions  . Penicillins Rash    Physical Exam General: well developed, well nourished, seated, in no evident distress Head: head normocephalic and atraumatic. Orohparynx benign Neck: supple with no carotid or supraclavicular bruits Cardiovascular: regular rate  and rhythm, no murmurs Musculoskeletal: no deformity Skin:  no rash/petichiae Vascular:  Normal pulses all extremities Filed Vitals:   06/30/13 1504  BP: 138/94  Pulse: 96    Neurologic Exam Mental Status: Awake and fully alert. Oriented to place and time. Recent and remote memory intact. Attention span, concentration and fund of knowledge appropriate. Mood and affect appropriate.  Cranial Nerves: Fundoscopic exam reveals sharp disc margins. Pupils  equal, briskly reactive to light. Extraocular movements full without nystagmus. Visual fields full to confrontation. Hearing intact. Facial sensation intact. Face, tongue, palate moves normally and symmetrically.  Motor: Normal bulk and tone. Normal strength in all tested extremity muscles.diminished fine finger movements are diminished on left and orbits right over left upper extremity Sensory.: intact to touch and pinprick and vibratory sensation.  Coordination: Rapid alternating movements normal in all extremities. Finger-to-nose and heel-to-shin performed accurately bilaterally. Gait and Station: Arises from chair without difficulty. Stance is normal. Gait demonstrates normal stride length and balance . Able to heel, toe and tandem walk without difficulty.  Reflexes: 1+ and symmetric. Toes downgoing.   NIHSS  0 Modified Rankin  1   ASSESSMENT: 82 year lady with right brain subcortical infarct in October 2014 as well as remote right frontal insular infarct of cryptogenic etiology. Questionable small left caudate lacunar infarct. Vascular risk factors of Hypertension and Hyperlipidimia.Questionable PFO on TEE in 2008 but not seen on TCD Bubble study.    PLAN: Continue clopidogrel for stroke prevention but if unable to afford may switch to Aspirin 325 mg daily.Strict control of HT with BP goal below 130/90 and Lipids with LDL cholesterol below 100 mg %.Check TCD Bubble study and emboli monitoring.      [facility]      [address]      [place].  [address] [phone]       TRANSCRANIAL DOPPLER BUBBLE STUDY   [name] Date of Birth:  1966-06-27 Medical Record Number:  [id number]   Indications: Diagnostic Date of Procedure: 06/29/2013 Clinical History:  47 year patient with stroke Technical Description:   Transcranial Doppler Bubble Study was performed at the bedside after taking written informed consent from the patient and explaining  risk/benefits. Both middle cerebral arteries were insonated using a headset. And IV line was inserted in the left forearm by the RN using aseptic precautions. Agitated saline injection at rest and after valsalva maneuver did not result in high intensity transient signals (HITS).   Impression:  Negative Transcranial Doppler Bubble Study not indicative of right to left intracardiac shunt.   Results were explained to the patient. Questions were answered.The TEE finding of PFO in 2008 seems questionable.

## 2013-06-30 NOTE — Patient Instructions (Signed)
Continue clopidogrel for stroke prevention but if unable to afford may switch to Aspirin 325 mg daily.Strict control of HT with BP goal below 130/90 and Lipids with LDL cholesterol below 100 mg %.

## 2013-06-30 NOTE — Telephone Encounter (Signed)
Pt states the dipyridamole-aspirin (AGGRENOX) 200-25 MG per 12 hr is over $300 w/ her insurance pt would like to know if we have rx discount card or another rx she could get.

## 2013-07-01 ENCOUNTER — Encounter: Payer: Self-pay | Admitting: Family

## 2013-07-01 ENCOUNTER — Other Ambulatory Visit: Payer: Self-pay

## 2013-07-01 ENCOUNTER — Encounter: Payer: 59 | Admitting: Internal Medicine

## 2013-07-01 MED ORDER — CLOPIDOGREL BISULFATE 75 MG PO TABS: 75.0000 mg | ORAL_TABLET | Freq: Every day | ORAL | Status: DC

## 2013-07-01 MED ORDER — DIPYRIDAMOLE 75 MG PO TABS: 75.0000 mg | ORAL_TABLET | Freq: Two times a day (BID) | ORAL | Status: DC

## 2013-07-01 NOTE — Telephone Encounter (Signed)
Per tamesha pt did not need PT check

## 2013-07-01 NOTE — Telephone Encounter (Signed)
Dr. Kirtland Bouchard, do you have any other suggestions for Rebecca Yoder with regards to troke prevention? Aggrenox too expensive and Plavix causing headaches

## 2013-07-01 NOTE — Progress Notes (Signed)
Subjective:    Patient ID: Rebecca Yoder, female    DOB: 06-22-1966, 47 y.o.   MRN: [id number]  HPI  47 year old [place] American female, nonsmoker, patient of Dr. Kirtland Bouchard then today with complaints of a persistent headache that occurred around 05/13/2013. Has a history of TIAs and 2 CVAs. On October 3, she was placed on Plavix once daily. Reports having a headache that ranges between a 4-10 out of 10. She is typically able to lie in a dark room helps her symptoms. No sensitivity to noise. The headache is also relieved with extra strength Tylenol. Headaches typically last one to 2 hours.  Review of Systems  Constitutional: Negative.   HENT: Negative for congestion, nosebleeds and postnasal drip.   Eyes: Negative.   Cardiovascular: Negative.   Gastrointestinal: Negative.   Endocrine: Negative.   Genitourinary: Negative.   Musculoskeletal: Negative.   Skin: Negative.   Neurological: Positive for headaches. Negative for dizziness.  Psychiatric/Behavioral: Negative.    Past Medical History  Diagnosis Date  . ASTHMA 12/10/2009  . CEREBROVASCULAR ACCIDENT, HX OF 12/10/2009  . HYPERLIPIDEMIA 12/10/2009  . HYPERTENSION 12/10/2009  . Complication of anesthesia     "just can't get me up" (06/09/2013)  . Stroke 2008    denies residual on 06/09/2013  . Tendonitis of wrist, right     History   Social History  . Marital Status: Single    Spouse Name: N/A    Number of Children: N/A  . Years of Education: N/A   Occupational History  . Not on file.   Social History Main Topics  . Smoking status: Never Smoker   . Smokeless tobacco: Never Used  . Alcohol Use: Yes     Comment: 06/09/2013 "might drink a couple times/yr"  . Drug Use: No  . Sexual Activity: Yes   Other Topics Concern  . Not on file   Social History Narrative  . No narrative on file    Past Surgical History  Procedure Laterality Date  . Ovarian cyst removal  1990's    No family history on file.  Allergies  Allergen  Reactions  . Penicillins Rash    Current Outpatient Prescriptions on File Prior to Visit  Medication Sig Dispense Refill  . acetaminophen (TYLENOL) 325 MG tablet Take 2 tablets (650 mg total) by mouth every 6 (six) hours as needed.  20 tablet  0  . albuterol (PROAIR HFA) 108 (90 BASE) MCG/ACT inhaler Inhale 2 puffs into the lungs every 6 (six) hours as needed for wheezing.  9 g  5  . losartan (COZAAR) 100 MG tablet Take 1 tablet (100 mg total) by mouth daily.  90 tablet  3  . Multiple Vitamin (MULTIVITAMIN WITH MINERALS) TABS tablet Take 1 tablet by mouth daily.      . pravastatin (PRAVACHOL) 20 MG tablet Take 20 mg by mouth daily at 3 pm.       No current facility-administered medications on file prior to visit.    BP 142/88  Pulse 63  Wt 184 lb (83.462 kg)  LMP 10/10/2014chart    Objective:   Physical Exam  Constitutional: She is oriented to person, place, and time. She appears well-developed and well-nourished.  HENT:  Right Ear: External ear normal.  Left Ear: External ear normal.  Nose: Nose normal.  Mouth/Throat: Oropharynx is clear and moist.  Eyes: Conjunctivae are normal. Pupils are equal, round, and reactive to light.  Neck: Normal range of motion. Neck supple.  Cardiovascular: Normal rate,  regular rhythm and normal heart sounds.   Pulmonary/Chest: Effort normal and breath sounds normal.  Abdominal: Soft. Bowel sounds are normal.  Musculoskeletal: Normal range of motion.  Neurological: She is alert and oriented to person, place, and time. She has normal reflexes. She displays normal reflexes. No cranial nerve deficit. Coordination normal.  Skin: Skin is warm and dry.  Psychiatric: She has a normal mood and affect.          Assessment & Plan:  Assessment: 1. Headache 2. Hypertension 3. History of CVA 4. History of TIA  Plan: After speaking with Dr. Kirtland Bouchard., DC Plavix. We will try Aggrenox twice daily. Bring patient back for recheck in 2 weeks. Consider Aggrenox  once daily and aspirin if she continues to have a headache. Cardiologist impression for concerns.

## 2013-07-01 NOTE — Telephone Encounter (Signed)
Please change that prescription to read dipyridamole 75 mg-  2 twice a day   #360  refill x3

## 2013-07-01 NOTE — Telephone Encounter (Signed)
Pt is aware md will charge med

## 2013-07-01 NOTE — Telephone Encounter (Signed)
Pt notified Rx sent to pharmacy and to take Aspirin 81 mg daily. Pt verbalized understanding.

## 2013-07-01 NOTE — Telephone Encounter (Signed)
Call in a new prescription for generic dipyridamole 75 mg  #180 one twice a day Have patient also take aspirin 81 mg one daily

## 2013-07-02 ENCOUNTER — Other Ambulatory Visit: Payer: Self-pay | Admitting: Internal Medicine

## 2013-07-08 ENCOUNTER — Other Ambulatory Visit: Payer: Self-pay | Admitting: Internal Medicine

## 2013-07-15 ENCOUNTER — Ambulatory Visit: Payer: PRIVATE HEALTH INSURANCE | Admitting: Family

## 2013-07-19 ENCOUNTER — Ambulatory Visit: Payer: Self-pay | Admitting: Neurology

## 2013-07-22 ENCOUNTER — Ambulatory Visit (INDEPENDENT_AMBULATORY_CARE_PROVIDER_SITE_OTHER): Payer: PRIVATE HEALTH INSURANCE | Admitting: Family

## 2013-07-22 ENCOUNTER — Encounter: Payer: Self-pay | Admitting: Family

## 2013-07-22 VITALS — BP 132/94 | HR 76 | Temp 98.1°F | Ht 64.0 in | Wt 186.0 lb

## 2013-07-22 DIAGNOSIS — R51 Headache: Secondary | ICD-10-CM

## 2013-07-22 DIAGNOSIS — I10 Essential (primary) hypertension: Secondary | ICD-10-CM

## 2013-07-22 DIAGNOSIS — Z8673 Personal history of transient ischemic attack (TIA), and cerebral infarction without residual deficits: Secondary | ICD-10-CM

## 2013-07-22 NOTE — Progress Notes (Signed)
Subjective:    Patient ID: Rebecca Yoder, female    DOB: 1966/06/05, 47 y.o.   MRN: [id number]  HPI  47 year old [place] American female with a history of CVA is in for recheck of headaches. Since she saw me last office visit, she see neurology. I originally switched her from Plavix to Aggrenox in hopes that her headaches go away. However, she was unable to afford the medication. Neurology suggested that she take aspirin 325 to control her cholesterol and blood pressure. She has subsequently gone back to taking Plavix 75 mg once daily and is tolerating it well without headache.  Review of Systems  Constitutional: Negative.   HENT: Negative.   Respiratory: Negative.   Cardiovascular: Negative.   Gastrointestinal: Negative.   Endocrine: Negative.   Genitourinary: Negative.   Musculoskeletal: Negative.   Neurological: Negative.   Hematological: Negative.   Psychiatric/Behavioral: Negative.    Past Medical History  Diagnosis Date  . ASTHMA 12/10/2009  . CEREBROVASCULAR ACCIDENT, HX OF 12/10/2009  . HYPERLIPIDEMIA 12/10/2009  . HYPERTENSION 12/10/2009  . Complication of anesthesia     "just can't get me up" (06/09/2013)  . Stroke 2008    denies residual on 06/09/2013  . Tendonitis of wrist, right     History   Social History  . Marital Status: Single    Spouse Name: N/A    Number of Children: N/A  . Years of Education: N/A   Occupational History  . Not on file.   Social History Main Topics  . Smoking status: Never Smoker   . Smokeless tobacco: Never Used  . Alcohol Use: Yes     Comment: 06/09/2013 "might drink a couple times/yr"  . Drug Use: No  . Sexual Activity: Yes   Other Topics Concern  . Not on file   Social History Narrative  . No narrative on file    Past Surgical History  Procedure Laterality Date  . Ovarian cyst removal  1990's    No family history on file.  Allergies  Allergen Reactions  . Penicillins Rash    Current Outpatient Prescriptions  on File Prior to Visit  Medication Sig Dispense Refill  . acetaminophen (TYLENOL) 325 MG tablet Take 2 tablets (650 mg total) by mouth every 6 (six) hours as needed.  20 tablet  0  . albuterol (PROAIR HFA) 108 (90 BASE) MCG/ACT inhaler Inhale 2 puffs into the lungs every 6 (six) hours as needed for wheezing.  9 g  5  . amLODipine (NORVASC) 2.5 MG tablet Take 1 tablet (2.5 mg total) by mouth daily.  30 tablet  3  . clopidogrel (PLAVIX) 75 MG tablet Take 1 tablet (75 mg total) by mouth daily.  90 tablet  1  . dipyridamole (PERSANTINE) 75 MG tablet Take 1 tablet (75 mg total) by mouth 2 (two) times daily.  360 tablet  3  . losartan (COZAAR) 100 MG tablet TAKE ONE TABLET BY MOUTH EVERY DAY  90 tablet  0  . Multiple Vitamin (MULTIVITAMIN WITH MINERALS) TABS tablet Take 1 tablet by mouth daily.      . pravastatin (PRAVACHOL) 20 MG tablet TAKE ONE TABLET BY MOUTH EVERY DAY  90 tablet  1   No current facility-administered medications on file prior to visit.    BP 132/94  Pulse 76  Temp(Src) 98.1 F (36.7 C) (Oral)  Ht 5\' 4"  (1.626 m)  Wt 186 lb (84.369 kg)  BMI 31.91 kg/m2chart    Objective:   Physical Exam  Constitutional: She is oriented to person, place, and time. She appears well-developed and well-nourished.  HENT:  Right Ear: External ear normal.  Left Ear: External ear normal.  Nose: Nose normal.  Mouth/Throat: Oropharynx is clear and moist.  Eyes: Conjunctivae are normal. Pupils are equal, round, and reactive to light.  Neck: Normal range of motion. Neck supple. No thyromegaly present.  Cardiovascular: Normal rate, regular rhythm and normal heart sounds.   Pulmonary/Chest: Effort normal and breath sounds normal.  Abdominal: Soft. Bowel sounds are normal.  Musculoskeletal: Normal range of motion.  Neurological: She is alert and oriented to person, place, and time.  Skin: Skin is warm and dry.  Psychiatric: She has a normal mood and affect.          Assessment & Plan:    Assessment: 1. Headache-resolved 2. History of CVA 3. Hyperlipidemia 4. Hypertension  Plan: Continue Plavix. Notify us is headaches reoccur. Return for CPX in 4 months with Dr. Kirtland Bouchard and sooner as needed.

## 2013-09-19 ENCOUNTER — Ambulatory Visit: Payer: Self-pay | Admitting: Neurology

## 2013-09-21 ENCOUNTER — Ambulatory Visit: Payer: PRIVATE HEALTH INSURANCE | Admitting: Neurology

## 2013-09-23 ENCOUNTER — Ambulatory Visit: Payer: PRIVATE HEALTH INSURANCE | Admitting: Neurology

## 2013-09-29 ENCOUNTER — Other Ambulatory Visit: Payer: Self-pay | Admitting: Internal Medicine

## 2013-10-04 ENCOUNTER — Ambulatory Visit: Payer: PRIVATE HEALTH INSURANCE | Admitting: Internal Medicine

## 2013-11-08 ENCOUNTER — Other Ambulatory Visit: Payer: Self-pay | Admitting: Family

## 2013-12-19 ENCOUNTER — Ambulatory Visit: Payer: PRIVATE HEALTH INSURANCE | Admitting: Internal Medicine

## 2013-12-19 ENCOUNTER — Other Ambulatory Visit: Payer: Self-pay

## 2013-12-19 DIAGNOSIS — Z1231 Encounter for screening mammogram for malignant neoplasm of breast: Secondary | ICD-10-CM

## 2014-01-07 ENCOUNTER — Other Ambulatory Visit: Payer: Self-pay | Admitting: Internal Medicine

## 2014-01-09 ENCOUNTER — Other Ambulatory Visit: Payer: Self-pay | Admitting: Neurology

## 2014-01-10 ENCOUNTER — Ambulatory Visit: Payer: Self-pay | Admitting: Neurology

## 2014-01-13 ENCOUNTER — Encounter: Payer: Self-pay | Admitting: Neurology

## 2014-01-13 ENCOUNTER — Telehealth: Payer: Self-pay | Admitting: Neurology

## 2014-01-13 NOTE — Telephone Encounter (Signed)
Left message for patient regarding rescheduling 02/08/14 appointment per Dr. Clydene Fake schedule, printed and mailed letter with new appointment time.

## 2014-01-18 ENCOUNTER — Ambulatory Visit: Payer: PRIVATE HEALTH INSURANCE | Admitting: Internal Medicine

## 2014-01-19 ENCOUNTER — Other Ambulatory Visit: Payer: Self-pay | Admitting: Internal Medicine

## 2014-01-20 ENCOUNTER — Ambulatory Visit: Payer: PRIVATE HEALTH INSURANCE | Admitting: Internal Medicine

## 2014-01-20 ENCOUNTER — Ambulatory Visit
Admission: RE | Admit: 2014-01-20 | Discharge: 2014-01-20 | Disposition: A | Discharge status: Home / Self Care | Payer: PRIVATE HEALTH INSURANCE | Source: Ambulatory Visit

## 2014-01-20 DIAGNOSIS — Z1231 Encounter for screening mammogram for malignant neoplasm of breast: Secondary | ICD-10-CM

## 2014-02-02 ENCOUNTER — Ambulatory Visit (INDEPENDENT_AMBULATORY_CARE_PROVIDER_SITE_OTHER): Payer: PRIVATE HEALTH INSURANCE | Admitting: Internal Medicine

## 2014-02-02 ENCOUNTER — Encounter: Payer: Self-pay | Admitting: Internal Medicine

## 2014-02-02 VITALS — BP 140/90 | HR 67 | Temp 98.1°F | Resp 20 | Ht 64.0 in | Wt 187.0 lb

## 2014-02-02 DIAGNOSIS — E785 Hyperlipidemia, unspecified: Secondary | ICD-10-CM

## 2014-02-02 DIAGNOSIS — I10 Essential (primary) hypertension: Secondary | ICD-10-CM

## 2014-02-02 DIAGNOSIS — Z8679 Personal history of other diseases of the circulatory system: Secondary | ICD-10-CM

## 2014-02-02 MED ORDER — FLUTICASONE PROPIONATE HFA 110 MCG/ACT IN AERO: 2.0000 | INHALATION_SPRAY | Freq: Two times a day (BID) | RESPIRATORY_TRACT | Status: DC

## 2014-02-02 MED ORDER — LOSARTAN POTASSIUM-HCTZ 100-12.5 MG PO TABS
1.0000 | ORAL_TABLET | Freq: Every day | ORAL | Status: DC
Start: 2014-02-02 — End: 2015-03-28

## 2014-02-02 NOTE — Progress Notes (Signed)
Subjective:    Patient ID: Rebecca Yoder, female    DOB: Sep 13, 1965, 48 y.o.   MRN: [id number]  HPI  BP Readings from Last 3 Encounters:  02/02/14 140/90  07/22/13 132/94  06/30/13 89/70   48 year old patient who has hypertension, dyslipidemia, and a history of recurrent cerebrovascular disease.  She is followed by neurology.  No focal neurological symptoms.  In general doing quite well. She also has asthma, which has been stable. Past Medical History  Diagnosis Date  . ASTHMA 12/10/2009  . CEREBROVASCULAR ACCIDENT, HX OF 12/10/2009  . HYPERLIPIDEMIA 12/10/2009  . HYPERTENSION 12/10/2009  . Complication of anesthesia     "just can't get me up" (06/09/2013)  . Stroke 2008    denies residual on 06/09/2013  . Tendonitis of wrist, right     History   Social History  . Marital Status: Single    Spouse Name: N/A    Number of Children: N/A  . Years of Education: N/A   Occupational History  . Not on file.   Social History Main Topics  . Smoking status: Never Smoker   . Smokeless tobacco: Never Used  . Alcohol Use: Yes     Comment: 06/09/2013 "might drink a couple times/yr"  . Drug Use: No  . Sexual Activity: Yes   Other Topics Concern  . Not on file   Social History Narrative  . No narrative on file    Past Surgical History  Procedure Laterality Date  . Ovarian cyst removal  1990's    No family history on file.  Allergies  Allergen Reactions  . Penicillins Rash    Current Outpatient Prescriptions on File Prior to Visit  Medication Sig Dispense Refill  . acetaminophen (TYLENOL) 325 MG tablet Take 2 tablets (650 mg total) by mouth every 6 (six) hours as needed.  20 tablet  0  . albuterol (PROAIR HFA) 108 (90 BASE) MCG/ACT inhaler Inhale 2 puffs into the lungs every 6 (six) hours as needed for wheezing.  9 g  5  . amLODipine (NORVASC) 2.5 MG tablet TAKE ONE TABLET BY MOUTH ONCE DAILY  30 tablet  0  . clopidogrel (PLAVIX) 75 MG tablet TAKE ONE TABLET BY MOUTH  ONCE DAILY  90 tablet  0  . Multiple Vitamin (MULTIVITAMIN WITH MINERALS) TABS tablet Take 1 tablet by mouth daily.      . pravastatin (PRAVACHOL) 20 MG tablet TAKE ONE TABLET BY MOUTH ONCE DAILY  90 tablet  1   No current facility-administered medications on file prior to visit.    BP 140/90  Pulse 67  Temp(Src) 98.1 F (36.7 C) (Oral)  Resp 20  Ht 5\' 4"  (1.626 m)  Wt 187 lb (84.823 kg)  BMI 32.08 kg/m2  SpO2 97%       Review of Systems  Constitutional: Negative.   HENT: Negative for congestion, dental problem, hearing loss, rhinorrhea, sinus pressure, sore throat and tinnitus.   Eyes: Negative for pain, discharge and visual disturbance.  Respiratory: Negative for cough and shortness of breath.   Cardiovascular: Negative for chest pain, palpitations and leg swelling.  Gastrointestinal: Negative for nausea, vomiting, abdominal pain, diarrhea, constipation, blood in stool and abdominal distention.  Genitourinary: Negative for dysuria, urgency, frequency, hematuria, flank pain, vaginal bleeding, vaginal discharge, difficulty urinating, vaginal pain and pelvic pain.  Musculoskeletal: Negative for arthralgias, gait problem and joint swelling.  Skin: Negative for rash.  Neurological: Negative for dizziness, syncope, speech difficulty, weakness, numbness and headaches.  Hematological:  Negative for adenopathy.  Psychiatric/Behavioral: Negative for behavioral problems, dysphoric mood and agitation. The patient is not nervous/anxious.        Objective:   Physical Exam  Constitutional: She is oriented to person, place, and time. She appears well-developed and well-nourished.  The pressure 135 over 90  HENT:  Head: Normocephalic.  Right Ear: External ear normal.  Left Ear: External ear normal.  Mouth/Throat: Oropharynx is clear and moist.  Eyes: Conjunctivae and EOM are normal. Pupils are equal, round, and reactive to light.  Neck: Normal range of motion. Neck supple. No  thyromegaly present.  Cardiovascular: Normal rate, regular rhythm, normal heart sounds and intact distal pulses.   Pulmonary/Chest: Effort normal and breath sounds normal.  Abdominal: Soft. Bowel sounds are normal. She exhibits no mass. There is no tenderness.  Musculoskeletal: Normal range of motion.  Lymphadenopathy:    She has no cervical adenopathy.  Neurological: She is alert and oriented to person, place, and time.  Skin: Skin is warm and dry. No rash noted.  Psychiatric: She has a normal mood and affect. Her behavior is normal.          Assessment & Plan:  Hypertension.  We'll add hydrochlorothiazide 12 point 5 mg to her blood pressure regimen Dyslipidemia.  Continue statin therapy Cerebrovascular disease.  Continue aggressive risk factor modification Asthma stable

## 2014-02-02 NOTE — Progress Notes (Signed)
Pre-visit discussion using our clinic review tool. No additional management support is needed unless otherwise documented below in the visit note.  

## 2014-02-02 NOTE — Patient Instructions (Signed)
Limit your sodium (Salt) intake  Please check your blood pressure on a regular basis.  If it is consistently greater than 150/90, please make an office appointment.  Return in 6 months for follow-up   

## 2014-02-03 ENCOUNTER — Telehealth: Payer: Self-pay | Admitting: Internal Medicine

## 2014-02-03 NOTE — Telephone Encounter (Signed)
Relevant patient education assigned to patient using Emmi. ° °

## 2014-02-08 ENCOUNTER — Ambulatory Visit: Payer: Self-pay | Admitting: Neurology

## 2014-02-09 ENCOUNTER — Other Ambulatory Visit: Payer: Self-pay | Admitting: Internal Medicine

## 2014-02-15 ENCOUNTER — Encounter: Payer: Self-pay | Admitting: Neurology

## 2014-02-15 ENCOUNTER — Ambulatory Visit (INDEPENDENT_AMBULATORY_CARE_PROVIDER_SITE_OTHER): Payer: PRIVATE HEALTH INSURANCE | Admitting: Neurology

## 2014-02-15 VITALS — BP 130/80 | HR 65 | Ht 64.5 in | Wt 184.0 lb

## 2014-02-15 DIAGNOSIS — I6350 Cerebral infarction due to unspecified occlusion or stenosis of unspecified cerebral artery: Secondary | ICD-10-CM

## 2014-02-15 NOTE — Progress Notes (Signed)
[facility] [address] [place]. [place] [address] [phone]       OFFICE FOLLOW-UP NOTE  [name] Date of Birth:  02/17/1966 Medical Record Number:  [id number]   HPI: 16 year lady with right brain subcortical infarct in October 2014 as well as remote right frontal insular infarct of cryptogenic etiology. Questionable small left caudate lacunar infarct. Vascular risk factors of Hypertension and Hyperlipidimia.Questionable PFO on TEE in 2008 but not seen on TCD Bubble study. Update 06/29/13 :She is seen for first office f/u after [facility] admission for stroke on 06/09/13. She developed left sided weakness at work and slurred speech.Symptoms were improving by the time she reached the hospital.Ct head showed remote right frontal infarct while MRI showed small right external capsule and right corona radiata infarct.questionable tiny left caudate infarct.Transthoraxic echo showed normal ejection fraction and carotid dopplers showed no significant stenosis.MRA brain was suboptimal with motion artefacts.Lipid profile was normal.She was started on clopidogrel but is having trouble affording it. She has done well without any recurrent symptoms and feels left sided strength is back to normal except mild diminished fine motor skills.She had extensive stroke evaluation in 2008 at time of her right frontal infarct including TEE  showing small PFO but TCD Bubble study was negative. Update 02/15/2014 ; She returns for followup today after  the last visit 6 months ago.. She continues to do well from the stroke standpoint without any recurrent or new stroke symptoms. She continues to have mild dragging of the left leg and heaviness as well as some diminished fine motor skills in the left hand but is unchanged. She continues to take Plavix and is tolerating it well without bruising or other side effects. She states her blood pressure is well controlled and today it is 130/8 sitter in the  office. She does have regular appointments with her primary physician. She has not had lipid profile checked in nearly a year now. ROS:   14 system review of systems is positive for hearing loss, sleep talking, leg heaviness and weakness only all other systems negative. PMH:  Past Medical History  Diagnosis Date  . ASTHMA 12/10/2009  . CEREBROVASCULAR ACCIDENT, HX OF 12/10/2009  . HYPERLIPIDEMIA 12/10/2009  . HYPERTENSION 12/10/2009  . Complication of anesthesia     "just can't get me up" (06/09/2013)  . Stroke 2008    denies residual on 06/09/2013  . Tendonitis of wrist, right     Social History:  History   Social History  . Marital Status: Single    Spouse Name: N/A    Number of Children: 0  . Years of Education: 12th   Occupational History  . [employer]    Social History Main Topics  . Smoking status: Never Smoker   . Smokeless tobacco: Never Used  . Alcohol Use: Yes     Comment: 06/09/2013 "might drink a couple times/yr"  . Drug Use: No  . Sexual Activity: Yes   Other Topics Concern  . Not on file   Social History Narrative  . No narrative on file    Medications:   Current Outpatient Prescriptions on File Prior to Visit  Medication Sig Dispense Refill  . acetaminophen (TYLENOL) 325 MG tablet Take 2 tablets (650 mg total) by mouth every 6 (six) hours as needed.  20 tablet  0  . albuterol (PROAIR HFA) 108 (90 BASE) MCG/ACT inhaler Inhale 2 puffs into the lungs every 6 (six) hours as needed for wheezing.  9 g  5  . amLODipine (NORVASC) 2.5 MG tablet TAKE ONE TABLET BY MOUTH ONCE DAILY  30 tablet  5  . clopidogrel (PLAVIX) 75 MG tablet TAKE ONE TABLET BY MOUTH ONCE DAILY  90 tablet  0  . fluticasone (FLOVENT HFA) 110 MCG/ACT inhaler Inhale 2 puffs into the lungs 2 (two) times daily.  1 Inhaler  12  . losartan-hydrochlorothiazide (HYZAAR) 100-12.5 MG per tablet Take 1 tablet by mouth daily.  90 tablet  4  . Multiple Vitamin (MULTIVITAMIN WITH MINERALS) TABS tablet Take 1 tablet  by mouth daily.      . pravastatin (PRAVACHOL) 20 MG tablet TAKE ONE TABLET BY MOUTH ONCE DAILY  90 tablet  1   No current facility-administered medications on file prior to visit.    Allergies:   Allergies  Allergen Reactions  . Penicillins Rash    Physical Exam General: well developed, well nourished, seated, in no evident distress Head: head normocephalic and atraumatic. Orohparynx benign Neck: supple with no carotid or supraclavicular bruits Cardiovascular: regular rate and rhythm, no murmurs Musculoskeletal: no deformity Skin:  no rash/petichiae Vascular:  Normal pulses all extremities Filed Vitals:   02/15/14 1105  BP: 130/80  Pulse: 65    Neurologic Exam Mental Status: Awake and fully alert. Oriented to place and time. Recent and remote memory intact. Attention span, concentration and fund of knowledge appropriate. Mood and affect appropriate.  Cranial Nerves: Fundoscopic exam reveals sharp disc margins. Pupils equal, briskly reactive to light. Extraocular movements full without nystagmus. Visual fields full to confrontation. Hearing intact. Facial sensation intact. Face, tongue, palate moves normally and symmetrically.  Motor: Normal bulk and tone. Normal strength in all tested extremity muscles.diminished fine finger movements are diminished on left and orbits right over left upper extremity Sensory.: intact to touch and pinprick and vibratory sensation.  Coordination: Rapid alternating movements normal in all extremities. Finger-to-nose and heel-to-shin performed accurately bilaterally. Gait and Station: Arises from chair without difficulty. Stance is normal. Gait demonstrates normal stride length and balance . Able to heel, toe and tandem walk without difficulty.  Reflexes: 1+ and symmetric. Toes downgoing.   NIHSS  0 Modified Rankin  1   ASSESSMENT: 74 year lady with right brain subcortical infarct in October 2014 as well as remote right frontal insular infarct of  cryptogenic etiology. Questionable small left caudate lacunar infarct. Vascular risk factors of Hypertension and Hyperlipidimia.Questionable PFO on TEE in 2008 but not seen on TCD Bubble study.    PLAN: Continue clopidogrel for stroke prevention but if unable to afford may switch to Aspirin 325 mg daily.Strict control of HT with BP goal below 130/90 and Lipids with LDL cholesterol below 100 mg %.Check fasting lipid profile. Follow up in 6 months with Charlott Holler, NP or call earlier if necessary.                Antony Contras, MD

## 2014-02-15 NOTE — Patient Instructions (Signed)
Continue Plavix for secondary stroke prevention and maintain strict control of hypertension with blood pressure goal below 130/90 and lipids with LDL cholesterol goal below 100 mg percent. Check fasting lipid profile. Return for followup in 6 months with Charlott Holler, NP or call earlier if necessary.

## 2014-05-01 ENCOUNTER — Other Ambulatory Visit: Payer: Self-pay | Admitting: Neurology

## 2014-05-03 ENCOUNTER — Telehealth: Payer: Self-pay | Admitting: Internal Medicine

## 2014-05-03 NOTE — Telephone Encounter (Signed)
Pt states her fluticasone (FLOVENT HFA) 110 MCG/ACT inhaler Cost too much. Wants to either switch to something else, or possible discount card.  Pt would like top know if she needs another pneum vac this year.  Got one last year.

## 2014-05-04 NOTE — Telephone Encounter (Signed)
Fluticasone Nasal spray is a generic and should be the cheapest Have patient check cost of Flonase or Nasonex, OTC  No further pneumonia shot needed until age 48

## 2014-05-04 NOTE — Telephone Encounter (Signed)
Please recommend another inhaler.

## 2014-05-04 NOTE — Telephone Encounter (Signed)
Left message on voicemail to call office.  

## 2014-05-05 ENCOUNTER — Ambulatory Visit (INDEPENDENT_AMBULATORY_CARE_PROVIDER_SITE_OTHER): Payer: PRIVATE HEALTH INSURANCE

## 2014-05-05 DIAGNOSIS — Z23 Encounter for immunization: Secondary | ICD-10-CM

## 2014-05-08 NOTE — Telephone Encounter (Signed)
Left message on voicemail to call office.  

## 2014-05-09 NOTE — Telephone Encounter (Signed)
Spoke to pt, told her Dr. Raliegh Ip said she can but OTC Flonase or Nasonex Spray would be cheaper and do not need pneumonia shot again until age 48. Pt verbalized understanding.

## 2014-08-08 ENCOUNTER — Ambulatory Visit: Payer: PRIVATE HEALTH INSURANCE | Admitting: Internal Medicine

## 2014-08-14 ENCOUNTER — Other Ambulatory Visit: Payer: Self-pay | Admitting: Neurology

## 2014-08-14 ENCOUNTER — Other Ambulatory Visit: Payer: Self-pay | Admitting: Internal Medicine

## 2014-08-24 ENCOUNTER — Ambulatory Visit (INDEPENDENT_AMBULATORY_CARE_PROVIDER_SITE_OTHER): Payer: PRIVATE HEALTH INSURANCE | Admitting: Family Medicine

## 2014-08-24 ENCOUNTER — Encounter: Payer: Self-pay | Admitting: Family Medicine

## 2014-08-24 ENCOUNTER — Encounter: Payer: Self-pay | Admitting: *Deleted

## 2014-08-24 VITALS — BP 112/78 | HR 78 | Temp 97.8°F | Ht 64.5 in | Wt 182.0 lb

## 2014-08-24 DIAGNOSIS — J4521 Mild intermittent asthma with (acute) exacerbation: Secondary | ICD-10-CM

## 2014-08-24 DIAGNOSIS — J069 Acute upper respiratory infection, unspecified: Secondary | ICD-10-CM

## 2014-08-24 MED ORDER — PREDNISONE 20 MG PO TABS
40.0000 mg | ORAL_TABLET | Freq: Every day | ORAL | Status: DC
Start: 2014-08-24 — End: 2015-06-14

## 2014-08-24 NOTE — Progress Notes (Signed)
Pre visit review using our clinic review tool, if applicable. No additional management support is needed unless otherwise documented below in the visit note. 

## 2014-08-24 NOTE — Progress Notes (Signed)
HPI:  Cough and congestion -started: 4-5 days ago -symptoms:nasal congestion, sore throat, PND, cough - mild sob and wheezing - had to use inhaler a little more then usual -denies:fever, NVD, tooth pain, sinus pain, ear pain, chest pain, sig increase in mucus production -has tried: musinex, albuterol -sick contacts/travel/risks: denies flu exposure, tick exposure or or Ebola risks -Hx of: allergies, asthma - reports uses albuteorl prn - worse with colds and allergies ROS: See pertinent positives and negatives per HPI.  Past Medical History  Diagnosis Date  . ASTHMA 12/10/2009  . CEREBROVASCULAR ACCIDENT, HX OF 12/10/2009  . HYPERLIPIDEMIA 12/10/2009  . HYPERTENSION 12/10/2009  . Complication of anesthesia     "just can't get me up" (06/09/2013)  . Stroke 2008    denies residual on 06/09/2013  . Tendonitis of wrist, right     Past Surgical History  Procedure Laterality Date  . Ovarian cyst removal  1990's    No family history on file.  History   Social History  . Marital Status: Single    Spouse Name: N/A    Number of Children: 0  . Years of Education: 12th   Occupational History  . [employer]    Social History Main Topics  . Smoking status: Never Smoker   . Smokeless tobacco: Never Used  . Alcohol Use: Yes     Comment: 06/09/2013 "might drink a couple times/yr"  . Drug Use: No  . Sexual Activity: Yes   Other Topics Concern  . None   Social History Narrative     Current outpatient prescriptions:  .  acetaminophen (TYLENOL) 325 MG tablet, Take 2 tablets (650 mg total) by mouth every 6 (six) hours as needed., Disp: 20 tablet, Rfl: 0 .  albuterol (PROAIR HFA) 108 (90 BASE) MCG/ACT inhaler, Inhale 2 puffs into the lungs every 6 (six) hours as needed for wheezing., Disp: 9 g, Rfl: 5 .  amLODipine (NORVASC) 2.5 MG tablet, TAKE ONE TABLET BY MOUTH ONCE DAILY, Disp: 30 tablet, Rfl: 0 .  clopidogrel (PLAVIX) 75 MG tablet, TAKE ONE TABLET BY MOUTH ONCE DAILY, Disp: 90 tablet,  Rfl: 1 .  losartan-hydrochlorothiazide (HYZAAR) 100-12.5 MG per tablet, Take 1 tablet by mouth daily., Disp: 90 tablet, Rfl: 4 .  Multiple Vitamin (MULTIVITAMIN WITH MINERALS) TABS tablet, Take 1 tablet by mouth daily., Disp: , Rfl:  .  pravastatin (PRAVACHOL) 20 MG tablet, TAKE ONE TABLET BY MOUTH ONCE DAILY, Disp: 90 tablet, Rfl: 0 .  predniSONE (DELTASONE) 20 MG tablet, Take 2 tablets (40 mg total) by mouth daily with breakfast., Disp: 8 tablet, Rfl: 0  EXAM:  Filed Vitals:   08/24/14 0925  BP: 112/78  Pulse: 78  Temp: 97.8 F (36.6 C)    Body mass index is 30.77 kg/(m^2).  GENERAL: vitals reviewed and listed above, alert, oriented, appears well hydrated and in no acute distress  HEENT: atraumatic, conjunttiva clear, no obvious abnormalities on inspection of external nose and ears, normal appearance of ear canals and TMs, clear nasal congestion, mild post oropharyngeal erythema with PND, no tonsillar edema or exudate, no sinus TTP  NECK: no obvious masses on inspection  LUNGS: clear to auscultation bilaterally, no wheezes, rales or rhonchi, good air movement  CV: HRRR, no peripheral edema  MS: moves all extremities without noticeable abnormality  PSYCH: pleasant and cooperative, no obvious depression or anxiety  ASSESSMENT AND PLAN:  Discussed the following assessment and plan:  Asthma, mild intermittent, with acute exacerbation  Acute upper respiratory infection  -  given HPI and exam findings today, a serious infection or illness is unlikely. We discussed potential etiologies, with VURI being most likely with mild flare in her asthma symptoms, and we opted for steroid short burst and supportive care and monitoring. We discussed treatment side effects, likely course, antibiotic misuse, transmission, and signs of developing a serious illness. Not likely bacterial so opted against abx. -of course, we advised to return or notify a doctor immediately if symptoms worsen or  persist or new concerns arise.  There are no Patient Instructions on file for this visit.   Colin Benton R.

## 2014-08-24 NOTE — Patient Instructions (Signed)
INSTRUCTIONS FOR UPPER RESPIRATORY INFECTION:  -plenty of rest and fluids  -As we discussed, we have prescribed a new medication (prednisone) for you at this appointment. We discussed the common and serious potential adverse effects of this medication and you can review these and more with the pharmacist when you pick up your medication.  Please follow the instructions for use carefully and notify us immediately if you have any problems taking this medication.   -nasal saline wash 2-3 times daily (use prepackaged nasal saline or bottled/distilled water if making your own)   -use your asthma and allergies medicines as advised  -can use AFRIN nasal spray for drainage and nasal congestion - but do NOT use longer then 3-4 days  -can use tylenol or ibuprofen as directed for aches and sorethroat  -in the winter time, using a humidifier at night is helpful (please follow cleaning instructions)  -if you are taking a cough medication - use only as directed, may also try a teaspoon of honey to coat the throat and throat lozenges  -for sore throat, salt water gargles can help  -follow up if you have fevers, facial pain, tooth pain, difficulty breathing or are worsening or not getting better in 5-7 days

## 2014-08-25 ENCOUNTER — Telehealth: Payer: Self-pay | Admitting: Internal Medicine

## 2014-08-25 NOTE — Telephone Encounter (Signed)
yes

## 2014-08-25 NOTE — Telephone Encounter (Signed)
Patient Name: Rebecca Yoder DOB: [birth year] Nurse Assessment Nurse: Verlin Fester RN, Stanton Kidney Date/Time (Eastern Time): 08/25/2014 10:31:41 AM Confirm and document reason for call. If symptomatic, describe symptoms. ---Patient states she was started on Pravastatin yesterday and wants to know if she can take these with her other medicine. She takes Coumadin and Amlodipine Has the patient traveled out of the country within the last 30 days? ---Not Applicable Does the patient require triage? ---No Please document clinical information provided and list any resource used. ---Patient instructed per: http://www.drugs.com/interactionscheck. php?drug_list=172-0,1929-0,2311-1529 that she can take those medicines together but the doctor will keep a close eye on her Coumadin in case there are changes in it. Guidelines Guideline Title Affirmed Question Affirmed Notes Final Disposition User Clinical Call Cylinder, RN, [facility]

## 2014-08-25 NOTE — Telephone Encounter (Signed)
FYI

## 2014-09-20 ENCOUNTER — Other Ambulatory Visit: Payer: Self-pay | Admitting: Internal Medicine

## 2014-11-23 ENCOUNTER — Other Ambulatory Visit: Payer: Self-pay | Admitting: Internal Medicine

## 2015-02-06 ENCOUNTER — Other Ambulatory Visit: Payer: Self-pay | Admitting: Internal Medicine

## 2015-03-15 ENCOUNTER — Other Ambulatory Visit: Payer: Self-pay | Admitting: Internal Medicine

## 2015-03-28 ENCOUNTER — Other Ambulatory Visit: Payer: Self-pay | Admitting: Internal Medicine

## 2015-04-23 ENCOUNTER — Other Ambulatory Visit: Payer: Self-pay | Admitting: Internal Medicine

## 2015-06-07 NOTE — Telephone Encounter (Signed)
Error

## 2015-06-11 ENCOUNTER — Inpatient Hospital Stay (HOSPITAL_COMMUNITY)
Admission: EM | Admit: 2015-06-11 | Discharge: 2015-06-14 | DRG: 062 | Disposition: A | Discharge status: Rehabilitation Facility | Payer: Medicaid Other | Attending: Neurology | Admitting: Neurology

## 2015-06-11 ENCOUNTER — Emergency Department (HOSPITAL_COMMUNITY): Payer: Medicaid Other

## 2015-06-11 ENCOUNTER — Encounter (HOSPITAL_COMMUNITY): Payer: Self-pay | Admitting: Emergency Medicine

## 2015-06-11 DIAGNOSIS — I10 Essential (primary) hypertension: Secondary | ICD-10-CM | POA: Diagnosis present

## 2015-06-11 DIAGNOSIS — Q2112 Patent foramen ovale: Secondary | ICD-10-CM

## 2015-06-11 DIAGNOSIS — Z79899 Other long term (current) drug therapy: Secondary | ICD-10-CM | POA: Diagnosis not present

## 2015-06-11 DIAGNOSIS — I6340 Cerebral infarction due to embolism of unspecified cerebral artery: Secondary | ICD-10-CM | POA: Diagnosis present

## 2015-06-11 DIAGNOSIS — Z8673 Personal history of transient ischemic attack (TIA), and cerebral infarction without residual deficits: Secondary | ICD-10-CM | POA: Diagnosis not present

## 2015-06-11 DIAGNOSIS — I69391 Dysphagia following cerebral infarction: Secondary | ICD-10-CM | POA: Diagnosis not present

## 2015-06-11 DIAGNOSIS — R471 Dysarthria and anarthria: Secondary | ICD-10-CM | POA: Diagnosis present

## 2015-06-11 DIAGNOSIS — I639 Cerebral infarction, unspecified: Secondary | ICD-10-CM | POA: Diagnosis not present

## 2015-06-11 DIAGNOSIS — G8194 Hemiplegia, unspecified affecting left nondominant side: Secondary | ICD-10-CM | POA: Diagnosis present

## 2015-06-11 DIAGNOSIS — Q211 Atrial septal defect: Secondary | ICD-10-CM

## 2015-06-11 DIAGNOSIS — D62 Acute posthemorrhagic anemia: Secondary | ICD-10-CM | POA: Diagnosis not present

## 2015-06-11 DIAGNOSIS — I63511 Cerebral infarction due to unspecified occlusion or stenosis of right middle cerebral artery: Secondary | ICD-10-CM | POA: Diagnosis not present

## 2015-06-11 DIAGNOSIS — M25512 Pain in left shoulder: Secondary | ICD-10-CM | POA: Diagnosis not present

## 2015-06-11 DIAGNOSIS — R6889 Other general symptoms and signs: Secondary | ICD-10-CM | POA: Diagnosis not present

## 2015-06-11 DIAGNOSIS — I69354 Hemiplegia and hemiparesis following cerebral infarction affecting left non-dominant side: Secondary | ICD-10-CM | POA: Diagnosis not present

## 2015-06-11 DIAGNOSIS — Z7952 Long term (current) use of systemic steroids: Secondary | ICD-10-CM

## 2015-06-11 DIAGNOSIS — R739 Hyperglycemia, unspecified: Secondary | ICD-10-CM | POA: Diagnosis present

## 2015-06-11 DIAGNOSIS — Z7902 Long term (current) use of antithrombotics/antiplatelets: Secondary | ICD-10-CM

## 2015-06-11 DIAGNOSIS — I6789 Other cerebrovascular disease: Secondary | ICD-10-CM | POA: Diagnosis not present

## 2015-06-11 DIAGNOSIS — E785 Hyperlipidemia, unspecified: Secondary | ICD-10-CM | POA: Diagnosis present

## 2015-06-11 DIAGNOSIS — R51 Headache: Secondary | ICD-10-CM | POA: Diagnosis not present

## 2015-06-11 DIAGNOSIS — Z880 Allergy status to penicillin: Secondary | ICD-10-CM

## 2015-06-11 DIAGNOSIS — R414 Neurologic neglect syndrome: Secondary | ICD-10-CM | POA: Diagnosis present

## 2015-06-11 DIAGNOSIS — I63031 Cerebral infarction due to thrombosis of right carotid artery: Secondary | ICD-10-CM | POA: Diagnosis not present

## 2015-06-11 HISTORY — DX: Cerebral infarction, unspecified: I63.9

## 2015-06-11 LAB — COMPREHENSIVE METABOLIC PANEL
ALK PHOS: 55 U/L (ref 38–126)
ALT: 30 U/L (ref 14–54)
AST: 26 U/L (ref 15–41)
Albumin: 4.1 g/dL (ref 3.5–5.0)
Anion gap: 11 (ref 5–15)
BUN: 16 mg/dL (ref 6–20)
CALCIUM: 10.2 mg/dL (ref 8.9–10.3)
CHLORIDE: 102 mmol/L (ref 101–111)
CO2: 24 mmol/L (ref 22–32)
CREATININE: 0.82 mg/dL (ref 0.44–1.00)
GFR calc Af Amer: >60 mL/min (ref >60)
GFR calc non Af Amer: >60 mL/min (ref >60)
Glucose, Bld: 131 mg/dL — ABNORMAL HIGH (ref 65–99)
Potassium: 3.5 mmol/L (ref 3.5–5.1)
SODIUM: 137 mmol/L (ref 135–145)
Total Bilirubin: 0.1 mg/dL — ABNORMAL LOW (ref 0.3–1.2)
Total Protein: 7.3 g/dL (ref 6.5–8.1)

## 2015-06-11 LAB — I-STAT TROPONIN, ED: Troponin i, poc: 0 ng/mL (ref 0.00–0.08)

## 2015-06-11 LAB — I-STAT CHEM 8, ED
BUN: 19 mg/dL (ref 6–20)
Calcium, Ion: 1.18 mmol/L (ref 1.12–1.23)
Chloride: 100 mmol/L — ABNORMAL LOW (ref 101–111)
Creatinine, Ser: 0.8 mg/dL (ref 0.44–1.00)
Glucose, Bld: 130 mg/dL — ABNORMAL HIGH (ref 65–99)
HCT: 39 % (ref 36.0–46.0)
Hemoglobin: 13.3 g/dL (ref 12.0–15.0)
Potassium: 3.4 mmol/L — ABNORMAL LOW (ref 3.5–5.1)
Sodium: 137 mmol/L (ref 135–145)
TCO2: 22 mmol/L (ref 0–100)

## 2015-06-11 LAB — CBC
HEMATOCRIT: 35.4 % — AB (ref 36.0–46.0)
Hemoglobin: 12.1 g/dL (ref 12.0–15.0)
MCH: 30.3 pg (ref 26.0–34.0)
MCHC: 34.2 g/dL (ref 30.0–36.0)
MCV: 88.5 fL (ref 78.0–100.0)
PLATELETS: 395 10*3/uL (ref 150–400)
RBC: 4 MIL/uL (ref 3.87–5.11)
RDW: 14.4 % (ref 11.5–15.5)
WBC: 7 10*3/uL (ref 4.0–10.5)

## 2015-06-11 LAB — GLUCOSE, CAPILLARY: GLUCOSE-CAPILLARY: 111 mg/dL — AB (ref 65–99)

## 2015-06-11 LAB — PROTIME-INR
INR: 1.11 (ref 0.00–1.49)
Prothrombin Time: 14.5 seconds (ref 11.6–15.2)

## 2015-06-11 LAB — MRSA PCR SCREENING: MRSA BY PCR: NEGATIVE

## 2015-06-11 LAB — DIFFERENTIAL
Basophils Absolute: 0 10*3/uL (ref 0.0–0.1)
Basophils Relative: 0 %
Eosinophils Absolute: 0.5 10*3/uL (ref 0.0–0.7)
Eosinophils Relative: 6 %
Lymphocytes Relative: 60 %
Lymphs Abs: 4.2 10*3/uL — ABNORMAL HIGH (ref 0.7–4.0)
Monocytes Absolute: 0.7 10*3/uL (ref 0.1–1.0)
Monocytes Relative: 9 %
Neutro Abs: 1.7 10*3/uL (ref 1.7–7.7)
Neutrophils Relative %: 24 %

## 2015-06-11 LAB — CBG MONITORING, ED: Glucose-Capillary: 95 mg/dL (ref 65–99)

## 2015-06-11 LAB — APTT: APTT: 27 s (ref 24–37)

## 2015-06-11 MED ORDER — IOHEXOL 350 MG/ML SOLN
50.0000 mL | Freq: Once | INTRAVENOUS | Status: AC | PRN
  Administered 2015-06-11: 50 mL via INTRAVENOUS

## 2015-06-11 MED ORDER — ALTEPLASE (STROKE) FULL DOSE INFUSION
80.0000 mg | Freq: Once | INTRAVENOUS | Status: AC
  Administered 2015-06-11: 80 mg via INTRAVENOUS
  Filled 2015-06-11: qty 80

## 2015-06-11 MED ORDER — SODIUM CHLORIDE 0.9 % IV SOLN
50.0000 mL | Freq: Once | INTRAVENOUS | Status: AC
  Administered 2015-06-11: 50 mL via INTRAVENOUS

## 2015-06-11 MED ORDER — STROKE: EARLY STAGES OF RECOVERY BOOK
Freq: Once | Status: AC
  Administered 2015-06-11: 1
  Filled 2015-06-11: qty 1

## 2015-06-11 MED ORDER — SODIUM CHLORIDE 0.9 % IV SOLN
INTRAVENOUS | Status: AC
  Administered 2015-06-11: 75 mL/h via INTRAVENOUS
  Administered 2015-06-12: 23:00:00 via INTRAVENOUS

## 2015-06-11 MED ORDER — ACETAMINOPHEN 650 MG RE SUPP: 650.0000 mg | RECTAL | Status: DC | PRN

## 2015-06-11 MED ORDER — PANTOPRAZOLE SODIUM 40 MG IV SOLR
40.0000 mg | Freq: Every day | INTRAVENOUS | Status: DC
  Administered 2015-06-11 – 2015-06-12 (×2): 40 mg via INTRAVENOUS
  Filled 2015-06-11 (×3): qty 40

## 2015-06-11 MED ORDER — LABETALOL HCL 5 MG/ML IV SOLN: 10.0000 mg | INTRAVENOUS | Status: DC | PRN

## 2015-06-11 MED ORDER — ACETAMINOPHEN 325 MG PO TABS: 650.0000 mg | ORAL_TABLET | ORAL | Status: DC | PRN

## 2015-06-11 MED ORDER — SENNOSIDES-DOCUSATE SODIUM 8.6-50 MG PO TABS
1.0000 | ORAL_TABLET | Freq: Every evening | ORAL | Status: DC | PRN
  Filled 2015-06-11: qty 1

## 2015-06-11 NOTE — Code Documentation (Addendum)
49yo female arriving to [facility] via [place] at 61. Patient with acute onset left sided weakness and right gaze at 1245.  EMS activated code stroke.  Stroke team at the bedside on arrival.  Labs drawn and patient cleared by Dr. Betsey Holiday.  Patient to CT.  PIV x1 started.  Patient to A12.  2nd PIV started.  NIHSS 10, see documentation for details and code stroke times.  Patient with continued right gaze, left sided weakness (arm>leg) and dysarthria.  Dr. Leonel Ramsay at the bedside discussing plan of care and treatment with tPA.  Patient initially requested that MD speak with her family, however, patient's boyfriend to the bedside and will proceed with tPA.  BP in tPA parameters.  8mg  tPA bolus given at 1359 over 1 minute followed by 72mg /hr per pharmacy dosing.  Patient to CTA per Dr. Leonel Ramsay.  Patient having minimal bleeding to right upper and lower teeth, Dr. Leonel Ramsay made aware, no new orders, will continue to monitor.  Patient monitored during scan and tolerated well.  Patient monitored frequently per post-tPA protocol.  Bedside handoff with ED RN Melissa.

## 2015-06-11 NOTE — H&P (Signed)
Neurology Consultation Reason for Consult: Stroke Referring Physician: Sabra Heck, B  CC: Stroke  History is obtained from:patient  HPI: Rebecca Yoder is a 49 y.o. female with a history fo previous stroke and MCA stenosis presents with sudden onset left sided weakness and right gaze deviation earlier today. She was given tpa after discussion with her boyfriend and herself. After CTA, angiogram to possibly pursue IA therapy was discussed, but family/patient declined this therapy.    LKW: 12:45 pm tpa given?: yes    ROS: A 14 point ROS was performed and is negative except as noted in the HPI.   Past Medical History  Diagnosis Date  . ASTHMA 12/10/2009  . CEREBROVASCULAR ACCIDENT, HX OF 12/10/2009  . HYPERLIPIDEMIA 12/10/2009  . HYPERTENSION 12/10/2009  . Complication of anesthesia     "just can't get me up" (06/09/2013)  . Stroke [facility]) 2008    denies residual on 06/09/2013  . Tendonitis of wrist, right      History reviewed. No pertinent family history.   Social History:  reports that she has never smoked. She has never used smokeless tobacco. She reports that she drinks alcohol. She reports that she does not use illicit drugs.   Exam: Current vital signs: BP 174/89 mmHg  Pulse 93  Temp(Src) 98.1 F (36.7 C) (Oral)  Resp 18  Ht 5\' 4"  (1.626 m)  Wt 83 kg (182 lb 15.7 oz)  BMI 31.39 kg/m2  SpO2 97%  LMP 05/14/2015 (Approximate) Vital signs in last 24 hours: Temp:  [98.1 F (36.7 C)] 98.1 F (36.7 C) (10/31 1451) Pulse Rate:  [92-110] 93 (10/31 1945) Resp:  [13-30] 18 (10/31 1945) BP: (138-189)/(72-98) 174/89 mmHg (10/31 1945) SpO2:  [94 %-100 %] 97 % (10/31 1945) Weight:  [83 kg (182 lb 15.7 oz)] 83 kg (182 lb 15.7 oz) (10/31 1432)   Physical Exam  Constitutional: Appears well-developed and well-nourished.  Psych: Affect appropriate to situation Eyes: No scleral injection HENT: No OP obstrucion Head: Normocephalic.  Cardiovascular: Normal rate and regular rhythm.   Respiratory: Effort normal and breath sounds normal to anterior ascultation GI: Soft.  No distension. There is no tenderness.  Skin: WDI  Neuro: Mental Status: Patient is awake, alert, oriented to person, place, month. Does nto give correct age.  Patient is able to give a clear and coherent history. No signs of aphasia.  She might have mild left visual neglect, but does not extinguish to double simultaneous stimuli.  Cranial Nerves: II: Visual Fields are full. Pupils are equal, round, and reactive to light.   III,IV, VI: Right gaze deviation.  V: Facial sensation is symmetric to temperature VII: Facial movement is mild left weakness VIII: hearing is intact to voice X: Uvula elevates symmetrically XI: Shoulder shrug is symmetric. XII: tongue is midline without atrophy or fasciculations.  Motor: Tone is normal. Bulk is normal. 5/5 strength was present on the right, she is nearly plegic with 1/5 strength in the left arm, 4/5 in the left leg.  Sensory: Sensation is symmetric to light touch and pin in the arms and legs. Cerebellar: FNF  intact on the right, unable to perform on the left.     I have reviewed labs in epic and the results pertinent to this consultation are: Mildly elevated glucose.   I have reviewed the images obtained:CTA - occluded M1  Impression: 49 yo F with liekly R MCA distribution infarct. She has received tpa and is being admitted for further work/up/therapy.   Recommendations: 1.  HgbA1c, fasting lipid panel 2. MRI  of the brain without contrast 3. Frequent neuro checks 4. Echocardiogram 5. Carotid dopplers are not needed given CTA.  6. Prophylactic therapy-Antiplatelet med: none for 24 hours.  7. Risk factor modification 8. Telemetry monitoring 9. PT consult, OT consult, Speech consult    Roland Rack, MD [facility] [phone]  If 7pm- 7am, please page neurology on call as listed in [place].

## 2015-06-11 NOTE — ED Notes (Signed)
Dr. Kirkpatrick at bedside 

## 2015-06-11 NOTE — ED Provider Notes (Addendum)
CSN: [id number]     Arrival date & time 06/11/15  1334 History   First MD Initiated Contact with Patient 06/11/15 1347     No chief complaint on file.    (Consider location/radiation/quality/duration/timing/severity/associated sxs/prior Treatment) HPI Comments: Patient presents to the ER for evaluation as a code stroke. Patient had sudden onset of left-sided paralysis, speech disturbance at 1245. She was at work when this occurred. Patient has had previous stroke. Level V Caveat due to condition.   Past Medical History  Diagnosis Date  . ASTHMA 12/10/2009  . CEREBROVASCULAR ACCIDENT, HX OF 12/10/2009  . HYPERLIPIDEMIA 12/10/2009  . HYPERTENSION 12/10/2009  . Complication of anesthesia     "just can't get me up" (06/09/2013)  . Stroke 2008    denies residual on 06/09/2013  . Tendonitis of wrist, right    Past Surgical History  Procedure Laterality Date  . Ovarian cyst removal  1990's   No family history on file. Social History  Substance Use Topics  . Smoking status: Never Smoker   . Smokeless tobacco: Never Used  . Alcohol Use: Yes     Comment: 06/09/2013 "might drink a couple times/yr"   OB History    No data available     Review of Systems  Unable to perform ROS: Acuity of condition      Allergies  Penicillins  Home Medications   Prior to Admission medications   Medication Sig Start Date End Date Taking? Authorizing Provider  acetaminophen (TYLENOL) 325 MG tablet Take 2 tablets (650 mg total) by mouth every 6 (six) hours as needed. 06/10/13   Belkys A Regalado, MD  albuterol (PROAIR HFA) 108 (90 BASE) MCG/ACT inhaler Inhale 2 puffs into the lungs every 6 (six) hours as needed for wheezing. 06/14/12   Marletta Lor, MD  amLODipine (NORVASC) 2.5 MG tablet TAKE ONE TABLET BY MOUTH ONCE DAILY 04/24/15   Marletta Lor, MD  clopidogrel (PLAVIX) 75 MG tablet TAKE ONE TABLET BY MOUTH ONCE DAILY 08/14/14   Garvin Fila, MD  losartan-hydrochlorothiazide (HYZAAR)  100-12.5 MG per tablet TAKE ONE TABLET BY MOUTH ONCE DAILY 03/29/15   Marletta Lor, MD  Multiple Vitamin (MULTIVITAMIN WITH MINERALS) TABS tablet Take 1 tablet by mouth daily.    Historical Provider, MD  pravastatin (PRAVACHOL) 20 MG tablet TAKE ONE TABLET BY MOUTH ONCE DAILY 04/24/15   Marletta Lor, MD  predniSONE (DELTASONE) 20 MG tablet Take 2 tablets (40 mg total) by mouth daily with breakfast. 08/24/14   Lucretia Kern, DO   There were no vitals taken for this visit. Physical Exam  Constitutional: She appears well-developed and well-nourished. No distress.  HENT:  Head: Normocephalic and atraumatic.  Right Ear: Hearing normal.  Left Ear: Hearing normal.  Nose: Nose normal.  Mouth/Throat: Oropharynx is clear and moist and mucous membranes are normal.  Eyes: Conjunctivae and EOM are normal. Pupils are equal, round, and reactive to light.  Neck: Normal range of motion. Neck supple.  Cardiovascular: Regular rhythm, S1 normal and S2 normal.  Exam reveals no gallop and no friction rub.   No murmur heard. Pulmonary/Chest: Effort normal and breath sounds normal. No respiratory distress. She exhibits no tenderness.  Abdominal: Soft. Normal appearance and bowel sounds are normal. There is no hepatosplenomegaly. There is no tenderness. There is no rebound, no guarding, no tenderness at McBurney's point and negative Murphy's sign. No hernia.  Musculoskeletal: Normal range of motion.  Neurological: She is alert. No sensory deficit. She  exhibits abnormal muscle tone (Left upper extremity paralysis with significantly decreased strength in left lower extremity). GCS eye subscore is 4. GCS verbal subscore is 5. GCS motor subscore is 6.  Persistent rightward gaze  Skin: Skin is warm, dry and intact. No rash noted. No cyanosis.  Psychiatric: She has a normal mood and affect. Her speech is normal and behavior is normal. Thought content normal.  Nursing note and vitals reviewed.   ED Course   Procedures (including critical care time) Labs Review Labs Reviewed  PROTIME-INR  APTT  CBC  DIFFERENTIAL  COMPREHENSIVE METABOLIC PANEL  I-STAT [place], ED  I-STAT CHEM 8, ED  CBG MONITORING, ED    Imaging Review No results found. I have personally reviewed and evaluated these images and lab results as part of my medical decision-making.   EKG Interpretation   Date/Time:  Monday June 11 2015 13:55:41 EDT Ventricular Rate:  106 PR Interval:  165 QRS Duration: 91 QT Interval:  340 QTC Calculation: 451 R Axis:   64 Text Interpretation:  Sinus tachycardia Borderline T abnormalities,  anterior leads No significant change since last tracing Confirmed by  Tocara Mennen  MD, [place] [phone]) on 06/11/2015 2:22:37 PM      MDM   Final diagnoses:  None   acute CVA  Presents to the emergency department for evaluation of acute onset of stroke symptoms. Patient evaluated with neurology as a code stroke. Patient will receive TPA, admission to neurology service.    Orpah Greek, MD 06/11/15 [place], MD 06/11/15 [phone]

## 2015-06-11 NOTE — ED Notes (Signed)
Pt sister at bedside. MD Leonel Ramsay at bedside.

## 2015-06-11 NOTE — ED Notes (Signed)
CBG = 95 Melissa RN aware.

## 2015-06-11 NOTE — Consult Note (Deleted)
Neurology Consultation Reason for Consult: Stroke Referring Physician: Sabra Heck, B  CC: Stroke  History is obtained from:patient  HPI: Rebecca Yoder is a 49 y.o. female with a history fo previous stroke and MCA stenosis presents with sudden onset left sided weakness and right gaze deviation earlier today. She was given tpa after discussion with her boyfriend and herself. After CTA, angiogram to possibly pursue IA therapy was discussed, but family/patient declined this therapy.    LKW: 12:45 pm tpa given?: yes    ROS: A 14 point ROS was performed and is negative except as noted in the HPI.   Past Medical History  Diagnosis Date  . ASTHMA 12/10/2009  . CEREBROVASCULAR ACCIDENT, HX OF 12/10/2009  . HYPERLIPIDEMIA 12/10/2009  . HYPERTENSION 12/10/2009  . Complication of anesthesia     "just can't get me up" (06/09/2013)  . Stroke [facility]) 2008    denies residual on 06/09/2013  . Tendonitis of wrist, right      History reviewed. No pertinent family history.   Social History:  reports that she has never smoked. She has never used smokeless tobacco. She reports that she drinks alcohol. She reports that she does not use illicit drugs.   Exam: Current vital signs: BP 153/83 mmHg  Pulse 100  Temp(Src) 98.1 F (36.7 C) (Oral)  Resp 30  Ht 5\' 4"  (1.626 m)  Wt 83 kg (182 lb 15.7 oz)  BMI 31.39 kg/m2  SpO2 100%  LMP 05/14/2015 (Approximate) Vital signs in last 24 hours: Temp:  [98.1 F (36.7 C)] 98.1 F (36.7 C) (10/31 1451) Pulse Rate:  [95-110] 100 (10/31 1645) Resp:  [13-30] 30 (10/31 1645) BP: (138-189)/(73-98) 153/83 mmHg (10/31 1645) SpO2:  [94 %-100 %] 100 % (10/31 1645) Weight:  [83 kg (182 lb 15.7 oz)] 83 kg (182 lb 15.7 oz) (10/31 1432)   Physical Exam  Constitutional: Appears well-developed and well-nourished.  Psych: Affect appropriate to situation Eyes: No scleral injection HENT: No OP obstrucion Head: Normocephalic.  Cardiovascular: Normal rate and regular  rhythm.  Respiratory: Effort normal and breath sounds normal to anterior ascultation GI: Soft.  No distension. There is no tenderness.  Skin: WDI  Neuro: Mental Status: Patient is awake, alert, oriented to person, place, month. Does nto give correct age.  Patient is able to give a clear and coherent history. No signs of aphasia.  She might have mild left visual neglect, but does not extinguish to double simultaneous stimuli.  Cranial Nerves: II: Visual Fields are full. Pupils are equal, round, and reactive to light.   III,IV, VI: Right gaze deviation.  V: Facial sensation is symmetric to temperature VII: Facial movement is mild left weakness VIII: hearing is intact to voice X: Uvula elevates symmetrically XI: Shoulder shrug is symmetric. XII: tongue is midline without atrophy or fasciculations.  Motor: Tone is normal. Bulk is normal. 5/5 strength was present on the right, she is nearly plegic with 1/5 strength in the left arm, 4/5 in the left leg.  Sensory: Sensation is symmetric to light touch and pin in the arms and legs. Cerebellar: FNF  intact on the right, unable to perform on the left.     I have reviewed labs in epic and the results pertinent to this consultation are: Mildly elevated glucose.   I have reviewed the images obtained:CTA - occluded M1  Impression: 49 yo F with liekly R MCA distribution infarct. She has received tpa and is being admitted for further work/up/therapy.   Recommendations: 1.  HgbA1c, fasting lipid panel 2. MRI  of the brain without contrast 3. Frequent neuro checks 4. Echocardiogram 5. Carotid dopplers are not needed given CTA.  6. Prophylactic therapy-Antiplatelet med: none for 24 hours.  7. Risk factor modification 8. Telemetry monitoring 9. PT consult, OT consult, Speech consult    Roland Rack, MD [facility] [phone]  If 7pm- 7am, please page neurology on call as listed in [place].

## 2015-06-11 NOTE — ED Notes (Signed)
Pt here via EMS from work with sudden onset left sided weakness with facial droop and right sided gaze. Pt is a/o x 4, sensory intact, MD at bedside at the time of arrival.

## 2015-06-11 NOTE — Progress Notes (Signed)
[place] Progress Note Patient Name: Rebecca Yoder DOB: 1966-05-20 MRN: [id number]   Date of Service  06/11/2015  HPI/Events of Note  Adm with left sided weaknes, head CT - old rt frontal CVA, received tpa, BP ok, able to maintain airway , good cough on camera  eICU Interventions  Orders per neuro     Intervention Category Evaluation Type: New Patient Evaluation  Avia Merkley V. 06/11/2015, 9:25 PM

## 2015-06-12 ENCOUNTER — Encounter (HOSPITAL_COMMUNITY): Payer: Self-pay | Admitting: General Practice

## 2015-06-12 ENCOUNTER — Inpatient Hospital Stay (HOSPITAL_COMMUNITY): Payer: Medicaid Other

## 2015-06-12 DIAGNOSIS — I63031 Cerebral infarction due to thrombosis of right carotid artery: Secondary | ICD-10-CM

## 2015-06-12 DIAGNOSIS — I6789 Other cerebrovascular disease: Secondary | ICD-10-CM

## 2015-06-12 DIAGNOSIS — G8194 Hemiplegia, unspecified affecting left nondominant side: Secondary | ICD-10-CM

## 2015-06-12 LAB — LIPID PANEL
Cholesterol: 153 mg/dL (ref 0–200)
HDL: 39 mg/dL — AB (ref >40)
LDL CALC: 86 mg/dL (ref 0–99)
Total CHOL/HDL Ratio: 3.9 RATIO
Triglycerides: 139 mg/dL (ref <150)
VLDL: 28 mg/dL (ref 0–40)

## 2015-06-12 MED ORDER — ASPIRIN 325 MG PO TABS
325.0000 mg | ORAL_TABLET | Freq: Every day | ORAL | Status: DC
  Administered 2015-06-12 – 2015-06-13 (×2): 325 mg via ORAL
  Filled 2015-06-12 (×2): qty 1

## 2015-06-12 MED ORDER — PNEUMOCOCCAL VAC POLYVALENT 25 MCG/0.5ML IJ INJ
0.5000 mL | INJECTION | INTRAMUSCULAR | Status: AC
  Administered 2015-06-13: 0.5 mL via INTRAMUSCULAR
  Filled 2015-06-12: qty 0.5

## 2015-06-12 MED ORDER — PERFLUTREN LIPID MICROSPHERE
1.0000 mL | INTRAVENOUS | Status: AC | PRN
  Administered 2015-06-12: 2 mL via INTRAVENOUS
  Filled 2015-06-12: qty 10

## 2015-06-12 MED ORDER — SODIUM CHLORIDE 0.9 % IV SOLN
INTRAVENOUS | Status: DC
  Administered 2015-06-13: 08:00:00 via INTRAVENOUS
  Administered 2015-06-13: 100 mL/h via INTRAVENOUS

## 2015-06-12 MED ORDER — PRAVASTATIN SODIUM 20 MG PO TABS
20.0000 mg | ORAL_TABLET | Freq: Every day | ORAL | Status: DC
  Administered 2015-06-12 – 2015-06-14 (×3): 20 mg via ORAL
  Filled 2015-06-12 (×4): qty 1

## 2015-06-12 MED ORDER — INFLUENZA VAC SPLIT QUAD 0.5 ML IM SUSY
0.5000 mL | PREFILLED_SYRINGE | INTRAMUSCULAR | Status: AC
Start: 2015-06-13 — End: 2015-06-13
  Administered 2015-06-13: 0.5 mL via INTRAMUSCULAR
  Filled 2015-06-12: qty 0.5

## 2015-06-12 NOTE — Progress Notes (Signed)
Pt UOP for 11 hours approximately 200-239mL despite IV fluids at 63mL/hr. Pt bladder scan revealed 0 mL in bladder. On-call neurologist called Nicole Kindred). Orders given to increase fluid rate to 140mL/hr for 6 hours then to return to 197mL/hr. Will continue to monitor.

## 2015-06-12 NOTE — Progress Notes (Signed)
  Echocardiogram 2D Echocardiogram has been performed.  Jennette Dubin 06/12/2015, 4:42 PM

## 2015-06-12 NOTE — Evaluation (Signed)
Clinical/Bedside Swallow Evaluation Patient Details  Name: Rebecca Yoder MRN: [id number] Date of Birth: [birth year]  Today's Date: 06/12/2015 Time: SLP Start Time (ACUTE ONLY): 1108 SLP Stop Time (ACUTE ONLY): 1130 SLP Time Calculation (min) (ACUTE ONLY): 22 min  Past Medical History:  Past Medical History  Diagnosis Date  . ASTHMA 12/10/2009  . CEREBROVASCULAR ACCIDENT, HX OF 12/10/2009  . HYPERLIPIDEMIA 12/10/2009  . HYPERTENSION 12/10/2009  . Complication of anesthesia     "just can't get me up" (06/09/2013)  . Stroke [facility]) 2008    denies residual on 06/09/2013  . Tendonitis of wrist, right    Past Surgical History:  Past Surgical History  Procedure Laterality Date  . Ovarian cyst removal  1990's   HPI:  Rebecca Yoder is a 49 y.o. female with a history of multiple previous strokes and MCA stenosis, who presents with sudden onset left sided weakness and right gaze deviation. CT Head without acute findings, MRI pending. Pt s/p tPA.   Assessment / Plan / Recommendation Clinical Impression  Pt is without overt signs of aspiration throughout challenging with large, consecutive boluses, however apparent CN VII involvement impacts oral manipulation and transit of regular textures. Mod cues provided from SLP for adequate clearance with lingual sweeps and liquid washes. Recommend Dys 2 diet and thin liquids with SLP f/u for advancement.    Aspiration Risk  Mild    Diet Recommendation Dysphagia 2 (Fine chop);Thin   Medication Administration: Crushed with puree Compensations: Slow rate;Small sips/bites;Check for pocketing    Other  Recommendations Oral Care Recommendations: Oral care BID   Follow Up Recommendations   CIR    Frequency and Duration min 2x/week  2 weeks   Pertinent Vitals/Pain n/a    SLP Swallow Goals     Swallow Study Prior Functional Status       General Date of Onset: 06/11/15 Other Pertinent Information: Rebecca Yoder is a 49 y.o. female with a  history of multiple previous strokes and MCA stenosis, who presents with sudden onset left sided weakness and right gaze deviation. CT Head without acute findings, MRI pending. Pt s/p tPA. Type of Study: Bedside swallow evaluation Previous Swallow Assessment: none in chart Diet Prior to this Study: NPO Temperature Spikes Noted: No Respiratory Status: Room air History of Recent Intubation: No Behavior/Cognition: Alert;Cooperative;Pleasant mood;Other (Comment) (tearful) Oral Cavity - Dentition: Adequate natural dentition/normal for age Self-Feeding Abilities: Able to feed self;Needs assist Patient Positioning: Upright in bed Baseline Vocal Quality: Normal Volitional Cough: Strong    Oral/Motor/Sensory Function Overall Oral Motor/Sensory Function: Impaired Labial ROM: Reduced left Labial Symmetry: Abnormal symmetry left Labial Strength: Reduced Labial Sensation: Reduced Lingual ROM: Reduced left Lingual Symmetry: Within Functional Limits Lingual Strength: Reduced Facial ROM: Reduced left Facial Symmetry: Left droop Facial Sensation: Reduced   Ice Chips Ice chips: Not tested   Thin Liquid Thin Liquid: Impaired Presentation: Cup;Self Fed;Straw Pharyngeal  Phase Impairments: Suspected delayed Swallow    Nectar Thick Nectar Thick Liquid: Not tested   Honey Thick Honey Thick Liquid: Not tested   Puree Puree: Impaired Presentation: Self Fed;Spoon Pharyngeal Phase Impairments: Suspected delayed Swallow   Solid    Solid: Impaired Presentation: Self Fed Oral Phase Impairments: Reduced lingual movement/coordination;Reduced labial seal;Impaired anterior to posterior transit Oral Phase Functional Implications: Left anterior spillage;Left lateral sulci pocketing      Germain Osgood, M.A. CCC-SLP [phone]  Germain Osgood 06/12/2015,11:52 AM

## 2015-06-12 NOTE — Progress Notes (Signed)
CT scheduled for 1300. Per Dr. Leonie Man, MRI may be scheduled for tomorrow if she cannot be seen before CT scan. MRI contacted and they could not see pt before 1300. Scheduled for tomorrow.

## 2015-06-12 NOTE — Progress Notes (Signed)
PT Cancellation Note  Patient Details Name: Rebecca Yoder MRN: [id number] DOB: November 04, 1965   Cancelled Treatment:    Reason Eval/Treat Not Completed: Patient not medically ready pt on strict bedrest awaiting MRI per RN. Will follow up next available time to perform PT evaluation.   Marguarite Arbour A Akshaj Besancon 06/12/2015, 12:26 PM Wray Kearns, [place], DPT [phone]

## 2015-06-12 NOTE — Progress Notes (Signed)
STROKE TEAM PROGRESS NOTE   HISTORY Rebecca Yoder is a 49 y.o. female with a history of previous stroke and MCA stenosis presents with sudden onset left sided weakness and right gaze deviation earlier today (LKW 06/11/2015 at 1245). She was given tpa at 1359 after discussion with her boyfriend and herself. After CTA, angiogram to possibly pursue IA therapy was discussed, but family/patient declined this therapy.  She was admitted to the 2MW post tPA for further evaluation and treatment.   SUBJECTIVE (INTERVAL HISTORY) Her sister and RN are at the bedside.  Overall she feels her condition is unchanged, maybe even some improvement .Blood pressure has been stable. I took extensive history and reviewed her prior strokes in 2014 and 2009   OBJECTIVE Temp:  [97.7 F (36.5 C)-98.8 F (37.1 C)] 97.7 F (36.5 C) (11/01 1141) Pulse Rate:  [60-110] 86 (11/01 1002) Cardiac Rhythm:  [-] Normal sinus rhythm;Sinus tachycardia;Other (Comment) (11/01 0830) Resp:  [13-30] 23 (11/01 1002) BP: (109-189)/(58-115) 159/85 mmHg (11/01 1002) SpO2:  [93 %-100 %] 99 % (11/01 1002) Weight:  [83 kg (182 lb 15.7 oz)] 83 kg (182 lb 15.7 oz) (10/31 1432)  CBC:   Recent Labs Lab 06/11/15 1345  WBC 7.0  NEUTROABS 1.7  HGB 12.1  13.3  HCT 35.4*  39.0  MCV 88.5  PLT 324    Basic Metabolic Panel:   Recent Labs Lab 06/11/15 1345  NA 137  137  K 3.5  3.4*  CL 102  100*  CO2 24  GLUCOSE 131*  130*  BUN 16  19  CREATININE 0.82  0.80  CALCIUM 10.2    Lipid Panel:     Component Value Date/Time   CHOL 153 06/12/2015 0559   TRIG 139 06/12/2015 0559   HDL 39* 06/12/2015 0559   CHOLHDL 3.9 06/12/2015 0559   VLDL 28 06/12/2015 0559   LDLCALC 86 06/12/2015 0559   HgbA1c: No results found for: HGBA1C Urine Drug Screen:     Component Value Date/Time   LABOPIA NONE DETECTED 06/09/2013 1510   COCAINSCRNUR NONE DETECTED 06/09/2013 1510   LABBENZ NONE DETECTED 06/09/2013 1510   AMPHETMU NONE  DETECTED 06/09/2013 1510   THCU NONE DETECTED 06/09/2013 1510   LABBARB NONE DETECTED 06/09/2013 1510      IMAGING  Ct Head Wo Contrast  06/11/2015  No acute intracranial abnormality. Specifically, no CT features of acute ischemia on the current study. No evidence for acute hemorrhage. Old posterior right frontal infarct.  06/11/2015   Remote right frontal lobe infarct with encephalomalacia and subsequent dilation of the right lateral ventricle. Question acute extension of infarct along the superior margin.   CTA HEAD   06/11/2015  Moderate narrowing right internal carotid artery cavernous segment. Occluded M1 segment of the right middle cerebral artery. This appears to have progressed since the prior MR angiogram (which was significantly motion degraded). Decrease number of visualized right middle cerebral artery branch vessels consistent with remote infarct and possibly acute infarct. Hypoplastic A1 segment right anterior cerebral artery. Mild irregularity and narrowing left internal carotid artery cavernous segment. Mild irregularity and narrowing left middle cerebral artery M1 segment. Left vertebral artery is dominant. Slight irregularity distal vertebral arteries. Narrowed irregular basilar artery.   CTA NECK   06/11/2015  Web-like narrowing proximal right internal carotid artery with less than 50% diameter stenosis. Ectatic distal left common carotid artery extends posterior and medial to the hyoid with hyoid compressing the anterior lateral aspect of the distal left common  carotid artery with subsequent mild to slight moderate narrowing. No significant stenosis of the left carotid bifurcation. Left vertebral artery is dominant. Slight narrowing proximal left vertebral artery. Mild irregularity of the vertical segment of vertebral arteries bilaterally without high-grade stenosis. Scattered normal to top normal size lymph nodes more notable on the right. There may be coronary artery  calcifications.   MRI brain pending    PHYSICAL EXAM Pleasant frail middle aged african american lady not in distress. . Afebrile. Head is nontraumatic. Neck is supple without bruit.    Cardiac exam no murmur or gallop. Lungs are clear to auscultation. Distal pulses are well felt. Neurological Exam :  Awake alert severe dysarthria but can be understood with some difficulty. No aphasia. Oriented 3. Extraocular movements are full range without nystagmus. Decreased blink to threat on the left compared to the right. Moderate left lower facial weakness. Tongue midline. Motor system exam reveals left hemiparesis with grade 3/5 left upper extremity strength with weakness of left grip and intrinsic hand muscles. Grade 4/5 left lower extremity strength with weakness of hip flexors and ankle dorsiflexors. Tone is increased on the left compared to the right. Sensation appears symmetric bilaterally. Deep tendon reflexes are brisker on the left compared to the right. Left plantar is upgoing right is downgoing. Gait was not tested. ASSESSMENT/PLAN [name] is a 49 y.o. female with history of prior stroke x 2, hypertension, hyperlipidemia, and asthma presenting with left sided weakness and right gaze deviation. She received IV t-PA, refused IR.   Stroke:  Non-dominant small right brain infarct (MRI pending), suspect  unknown embolic source  Resultant  Worsening Left hemiparesis  MRI  pending   CTA head & neck intracranial atherosclerotic narrowing, occluded M1 at MCA. R sided top normal size lymph nodes  2D Echo  pending   Check LE venous doppler given known PFO  LDL 86  HgbA1c pending  SCDs for VTE prophylaxis DIET DYS 2 Room service appropriate?: Yes; Fluid consistency:: Thin  aspirin 325 mg daily and clopidogrel 75 mg daily prior to admission, now on No antithrombotic as within 24h of tPA  Ongoing aggressive stroke risk factor management  Therapy recommendations:  Pending. She  will need rehab, will go ahead and place rehab consult  Disposition:  pending   PFO  Known PFO from previous stroke workup  PFO on TEE in March 2008 but TCD 2014 neg  Repeat TCD bubble study tomorrow afternoon  Hypertension  Stable  Hyperlipidemia  Home meds:  pravachol 20   LDL 86, goal < 70  Resume statin   Continue statin at discharge  Hyperglycemia  Glu 130s  HgbA1c pending, goal < 7.0  Other Stroke Risk Factors  Hx stroke/TIA  05/2013 - cryptogenic, posterior R external capsule and posterior R corona radiata, embolic  7169 cryptogenic, R frontal infarct (after 9 days of OCPs)  Hospital day # [place] for Pager information 06/12/2015 12:20 PM  I have personally examined this patient, reviewed notes, independently viewed imaging studies, participated in medical decision making and plan of care. I have made any additions or clarifications directly to the above note. Agree with note above. She has presented with left hemiplegia likely due to right brain subcortical infarct  With known h/o right MCA stenosis/occlusion with h/o multiple prior strokesand was treated with IV TPA and remains at risk for neurological worsening, post TPA hemorrhage and needs close neurological monitoring and tight blood  pressure control. Check transcranial Doppler bubble studies for PFO. I had a long discussion with the patient and sister at the bedside and answered questions. This patient is critically ill and at significant risk of neurological worsening, death and care requires constant monitoring of vital signs, hemodynamics,respiratory and cardiac monitoring, extensive review of multiple databases, frequent neurological assessment, discussion with family, other specialists and medical decision making of high complexity.I have made any additions or clarifications directly to the above note.This critical care time does not reflect procedure time, or  teaching time or supervisory time of PA/NP/Med Resident etc but could involve care discussion time.  I spent 30 minutes of neurocritical care time  in the care of  this patient.      Antony Contras, MD Medical Director [facility] Pager: [phone] 06/12/2015 2:29 PM    To contact Stroke Continuity provider, please refer to [email or web address] After hours, contact General Neurology

## 2015-06-12 NOTE — Evaluation (Signed)
Speech Language Pathology Evaluation Patient Details Name: Rebecca Yoder MRN: [id number] DOB: March 28, 1966 Today's Date: 06/12/2015 Time: 7106-2694 SLP Time Calculation (min) (ACUTE ONLY): 22 min  Problem List:  Patient Active Problem List   Diagnosis Date Noted  . Stroke (cerebrum) ([place]) 06/11/2015  . TIA (transient ischemic attack) 06/09/2013  . Hypokalemia 06/09/2013  . PFO (patent foramen ovale) 06/09/2013  . CVA (cerebral infarction) 06/09/2013  . HYPERLIPIDEMIA 12/10/2009  . HYPERTENSION 12/10/2009  . Asthma 12/10/2009  . CEREBROVASCULAR ACCIDENT, HX OF 12/10/2009   Past Medical History:  Past Medical History  Diagnosis Date  . ASTHMA 12/10/2009  . CEREBROVASCULAR ACCIDENT, HX OF 12/10/2009  . HYPERLIPIDEMIA 12/10/2009  . HYPERTENSION 12/10/2009  . Complication of anesthesia     "just can't get me up" (06/09/2013)  . Stroke [facility]) 2008    denies residual on 06/09/2013  . Tendonitis of wrist, right    Past Surgical History:  Past Surgical History  Procedure Laterality Date  . Ovarian cyst removal  1990's   HPI:  Rebecca Yoder is a 49 y.o. female with a history of multiple previous strokes and MCA stenosis, who presents with sudden onset left sided weakness and right gaze deviation. CT Head without acute findings, MRI pending. Pt s/p tPA.   Assessment / Plan / Recommendation Clinical Impression  Pt has a moderate dysarthria characterized by reduced breath support and imprecise articulation due to CN VII involvement. Cognitive-linguistic function appears grossly WFL, with mild memory impairments noted that pt and her sister report to be baseline. Pt will need continued SLP f/u targeting motor speech to maximize functional communication.    SLP Assessment  Patient needs continued Speech Lanaguage Pathology Services    Follow Up Recommendations  Inpatient Rehab    Frequency and Duration min 2x/week  2 weeks   Pertinent Vitals/Pain Pain Assessment: No/denies pain    SLP Goals  Patient/Family Stated Goal: wants to improve her speech Potential to Achieve Goals (ACUTE ONLY): Good  SLP Evaluation Prior Functioning  Cognitive/Linguistic Baseline: Within functional limits  Lives With: Family Available Help at Discharge: Family;Available 24 hours/day Vocation: Full time employment   Cognition  Overall Cognitive Status: Within Functional Limits for tasks assessed Orientation Level: Oriented X4    Comprehension  Auditory Comprehension Overall Auditory Comprehension: Appears within functional limits for tasks assessed    Expression Expression Primary Mode of Expression: Verbal Verbal Expression Overall Verbal Expression: Appears within functional limits for tasks assessed   Oral / Motor Oral Motor/Sensory Function Overall Oral Motor/Sensory Function: Impaired Labial ROM: Reduced left Labial Symmetry: Abnormal symmetry left Labial Strength: Reduced Labial Sensation: Reduced Lingual ROM: Reduced left Lingual Symmetry: Within Functional Limits Lingual Strength: Reduced Facial ROM: Reduced left Facial Symmetry: Left droop Facial Sensation: Reduced Motor Speech Overall Motor Speech: Impaired Respiration: Impaired Level of Impairment: Phrase Phonation: Normal Resonance: Within functional limits Articulation: Impaired Level of Impairment: Phrase Intelligibility: Intelligibility reduced Word: 75-100% accurate Phrase: 50-74% accurate Motor Planning: Witnin functional limits Motor Speech Errors: Not applicable Effective Techniques: Slow rate       Germain Osgood, M.A. CCC-SLP [phone]  Germain Osgood 06/12/2015, 11:57 AM

## 2015-06-12 NOTE — Progress Notes (Signed)
Physical medicine rehabilitation consult requested chart reviewed. Await MRI study and currently on bed rest. Physical therapy evaluation pending. Plan to follow-up with formal rehabilitation consult

## 2015-06-13 ENCOUNTER — Inpatient Hospital Stay (HOSPITAL_COMMUNITY): Payer: Medicaid Other

## 2015-06-13 DIAGNOSIS — I639 Cerebral infarction, unspecified: Secondary | ICD-10-CM

## 2015-06-13 DIAGNOSIS — R414 Neurologic neglect syndrome: Secondary | ICD-10-CM | POA: Diagnosis present

## 2015-06-13 DIAGNOSIS — G8194 Hemiplegia, unspecified affecting left nondominant side: Secondary | ICD-10-CM | POA: Diagnosis present

## 2015-06-13 LAB — HEMOGLOBIN A1C
Hgb A1c MFr Bld: 6.6 % — ABNORMAL HIGH (ref 4.8–5.6)
Mean Plasma Glucose: 143 mg/dL

## 2015-06-13 MED ORDER — CLOPIDOGREL BISULFATE 75 MG PO TABS
75.0000 mg | ORAL_TABLET | Freq: Every day | ORAL | Status: DC
  Administered 2015-06-13 – 2015-06-14 (×2): 75 mg via ORAL
  Filled 2015-06-13 (×3): qty 1

## 2015-06-13 MED ORDER — PANTOPRAZOLE SODIUM 40 MG PO PACK
40.0000 mg | PACK | Freq: Every day | ORAL | Status: DC
  Administered 2015-06-13 – 2015-06-14 (×2): 40 mg
  Filled 2015-06-13 (×3): qty 20

## 2015-06-13 NOTE — Progress Notes (Signed)
Rehab admissions - Awaiting PT/OT evaluations and recommendations.  Will follow up once evaluations are completed.  Call me for questions.  #[phone] 

## 2015-06-13 NOTE — Progress Notes (Signed)
Speech Language Pathology Treatment: Dysphagia;Cognitive-Linquistic  Patient Details Name: Rebecca Yoder MRN: [id number] DOB: [birth year] Today's Date: 06/13/2015 Time: 7121-9758 SLP Time Calculation (min) (ACUTE ONLY): 10 min  Assessment / Plan / Recommendation Clinical Impression  During treatment focused on dysphagia and dysarthria pt was eager to participate. Pt showed no s/s of aspiration but still pockets solid foods, cleared with moderate cuing from SLP to use lingual sweep and follow with liquid wash down. SLP provided education and moderate cuing for slow rate of speech, over articulation, and increased volume. Pt demonstrated understanding and use of strategies. SLP will f/u with solid upgrade trials and dysarthria treatment.    HPI Other Pertinent Information: Rebecca Yoder is a 49 y.o. female with a history of multiple previous strokes and MCA stenosis, who presents with sudden onset left sided weakness and right gaze deviation. CT Head without acute findings, MRI pending. Pt s/p tPA.   Pertinent Vitals Pain Assessment: Faces Faces Pain Scale: No hurt  SLP Plan  Continue with current plan of care    Recommendations Diet recommendations: Dysphagia 2 (fine chop);Thin liquid Liquids provided via: Straw Medication Administration: Crushed with puree Supervision: Patient able to self feed;Intermittent supervision to cue for compensatory strategies Compensations: Slow rate;Small sips/bites;Check for pocketing Postural Changes and/or Swallow Maneuvers: Seated upright 90 degrees              Oral Care Recommendations: Oral care BID Follow up Recommendations: Inpatient Rehab Plan: Continue with current plan of care    [place], Student-SLP  Lanier Ensign 06/13/2015, 10:47 AM

## 2015-06-13 NOTE — Progress Notes (Signed)
OT Cancellation Note  Patient Details Name: Rebecca Yoder MRN: [id number] DOB: 1965-08-28   Cancelled Treatment:    Reason Eval/Treat Not Completed: Patient at procedure or test/ unavailable.  Will reattempt.   Darlina Rumpf [place], OTR/L [phone]  06/13/2015, 3:37 PM

## 2015-06-13 NOTE — Progress Notes (Signed)
STROKE TEAM PROGRESS NOTE   SUBJECTIVE (INTERVAL HISTORY) Family at bedside. She is unsure why MRI was not done.    OBJECTIVE Temp:  [98.5 F (36.9 C)-99.4 F (37.4 C)] 98.6 F (37 C) (11/02 1130) Pulse Rate:  [57-109] 87 (11/02 1500) Cardiac Rhythm:  [-] Normal sinus rhythm (11/02 1240) Resp:  [15-26] 24 (11/02 1500) BP: (112-184)/(65-133) 145/84 mmHg (11/02 1100) SpO2:  [94 %-100 %] 100 % (11/02 1500)  CBC:   Recent Labs Lab 06/11/15 1345  WBC 7.0  NEUTROABS 1.7  HGB 12.1  13.3  HCT 35.4*  39.0  MCV 88.5  PLT 270    Basic Metabolic Panel:   Recent Labs Lab 06/11/15 1345  NA 137  137  K 3.5  3.4*  CL 102  100*  CO2 24  GLUCOSE 131*  130*  BUN 16  19  CREATININE 0.82  0.80  CALCIUM 10.2    Lipid Panel:     Component Value Date/Time   CHOL 153 06/12/2015 0559   TRIG 139 06/12/2015 0559   HDL 39* 06/12/2015 0559   CHOLHDL 3.9 06/12/2015 0559   VLDL 28 06/12/2015 0559   LDLCALC 86 06/12/2015 0559   HgbA1c:  Lab Results  Component Value Date   HGBA1C 6.6* 06/12/2015   Urine Drug Screen:     Component Value Date/Time   LABOPIA NONE DETECTED 06/09/2013 1510   COCAINSCRNUR NONE DETECTED 06/09/2013 1510   LABBENZ NONE DETECTED 06/09/2013 1510   AMPHETMU NONE DETECTED 06/09/2013 1510   THCU NONE DETECTED 06/09/2013 1510   LABBARB NONE DETECTED 06/09/2013 1510      IMAGING  Ct Head Wo Contrast  06/12/2015 Stable non contrast CT appearance of the brain, chronic right MCA infarct. No acute cortically based infarct or intracranial hemorrhage identified.  06/11/2015  No acute intracranial abnormality. Specifically, no CT features of acute ischemia on the current study. No evidence for acute hemorrhage. Old posterior right frontal infarct.  06/11/2015   Remote right frontal lobe infarct with encephalomalacia and subsequent dilation of the right lateral ventricle. Question acute extension of infarct along the superior margin.   CTA HEAD    06/11/2015  Moderate narrowing right internal carotid artery cavernous segment. Occluded M1 segment of the right middle cerebral artery. This appears to have progressed since the prior MR angiogram (which was significantly motion degraded). Decrease number of visualized right middle cerebral artery branch vessels consistent with remote infarct and possibly acute infarct. Hypoplastic A1 segment right anterior cerebral artery. Mild irregularity and narrowing left internal carotid artery cavernous segment. Mild irregularity and narrowing left middle cerebral artery M1 segment. Left vertebral artery is dominant. Slight irregularity distal vertebral arteries. Narrowed irregular basilar artery.   CTA NECK   06/11/2015  Web-like narrowing proximal right internal carotid artery with less than 50% diameter stenosis. Ectatic distal left common carotid artery extends posterior and medial to the hyoid with hyoid compressing the anterior lateral aspect of the distal left common carotid artery with subsequent mild to slight moderate narrowing. No significant stenosis of the left carotid bifurcation. Left vertebral artery is dominant. Slight narrowing proximal left vertebral artery. Mild irregularity of the vertical segment of vertebral arteries bilaterally without high-grade stenosis. Scattered normal to top normal size lymph nodes more notable on the right. There may be coronary artery calcifications.   MRI brain pending  2D Echocardiogram  Left ventricle: The cavity size was normal. Systolic function wasvigorous. The estimated ejection fraction was in the range of 65%to 70%. Wall  motion was normal; there were no regional wallmotion abnormalities. The tissue Doppler parameters were normal.Left ventricular diastolic function parameters were normal. - Aortic valve: Trileaflet; normal thickness leaflets. There was noregurgitation. - Aortic root: The aortic root was normal in size. - Mitral valve: Structurally  normal valve. - Left atrium: The atrium was normal in size. - Right ventricle: The cavity size was normal. Wall thickness wasnormal. Systolic function was normal. - Right atrium: The atrium was normal in size. - Tricuspid valve: There was no regurgitation. - Pulmonic valve: There was no regurgitation. - Inferior vena cava: The vessel was normal in size. - Pericardium, extracardiac: There was no pericardial effusion. Impressions:  Normal study.  Venous Duplex Lower Ext.  Negative for deep and superficial vein thrombosis in both lower extremities.    PHYSICAL EXAM Pleasant frail middle aged african american lady not in distress. . Afebrile. Head is nontraumatic. Neck is supple without bruit.    Cardiac exam no murmur or gallop. Lungs are clear to auscultation. Distal pulses are well felt. Neurological Exam :  Awake alert severe dysarthria but can be understood with some difficulty. No aphasia. Oriented 3. Extraocular movements are full range without nystagmus. Decreased blink to threat on the left compared to the right. Moderate left lower facial weakness. Tongue midline. Motor system exam reveals left hemiparesis with grade 3/5 left upper extremity strength with weakness of left grip and intrinsic hand muscles. Grade 4/5 left lower extremity strength with weakness of hip flexors and ankle dorsiflexors. Tone is increased on the left compared to the right. Sensation appears symmetric bilaterally. Deep tendon reflexes are brisker on the left compared to the right. Left plantar is upgoing right is downgoing. Gait was not tested.   ASSESSMENT/PLAN Ms. ANHELICA FOWERS is a 49 y.o. female with history of prior stroke x 2, hypertension, hyperlipidemia, and asthma presenting with left sided weakness and right gaze deviation. She received IV t-PA, refused IR.   Stroke:  Non-dominant small right brain infarct (MRI pending), suspect  unknown embolic source  Resultant  Worsening Left  hemiparesis  MRI  pending   CTA head & neck intracranial atherosclerotic narrowing, occluded M1 at MCA. R sided top normal size lymph nodes  Post tPA CT without hemorrhage, age stroke  2D Echo  No source of embolus   LE venous doppler  negative  LDL 86  HgbA1c 6.6  SCDs for VTE prophylaxis DIET DYS 2 Room service appropriate?: Yes; Fluid consistency:: Thin  aspirin 325 mg daily and clopidogrel 75 mg daily prior to admission, now on aspirin 325 mg daily . Given stroke on aspirin, change to plavix. Pt agreeable.  Ongoing aggressive stroke risk factor management  Therapy recommendations:  CIR, rehab consult done  Disposition:  pending (rehad admissions coordinator following)  Transfer to floor, order written yesterday  PFO  Known PFO from previous stroke workup  PFO on TEE in March 2008 but TCD 2014 neg  Repeat TCD bubble study tomorrow afternoon. scheduled 3p  Hypertension  Stable  Hyperlipidemia  Home meds:  pravachol 20   LDL 86, goal < 70  Resume statin   Continue statin at discharge  Hyperglycemia  Glu 130s  HgbA1c pending, goal < 7.0  Other Stroke Risk Factors  Hx stroke/TIA  05/2013 - cryptogenic, posterior R external capsule and posterior R corona radiata, embolic  3151 cryptogenic, R frontal infarct (after 9 days of OCPs)  Decreased UOP  BUN/Cr normasl  Decreased UOP  Fluid bolus yest with increased IVF  Hospital day # 2        Antony Contras, [facility] Pager: [phone] 06/13/2015 4:11 PM    To contact Stroke Continuity provider, please refer to [email or web address] After hours, contact General Neurology

## 2015-06-13 NOTE — Progress Notes (Signed)
Preliminary results by tech - Venous Duplex Lower Ext. Completed. Negative for deep and superficial vein thrombosis in both lower extremities.  Oda Cogan, BS, RDMS, RVT

## 2015-06-13 NOTE — Progress Notes (Signed)
STROKE TEAM PROGRESS NOTE   SUBJECTIVE (INTERVAL HISTORY) Family at bedside. She is unsure why MRI was not done.    OBJECTIVE Temp:  [98.5 F (36.9 C)-99.4 F (37.4 C)] 98.6 F (37 C) (11/02 1130) Pulse Rate:  [57-109] 82 (11/02 1100) Cardiac Rhythm:  [-] Normal sinus rhythm (11/02 1240) Resp:  [16-29] 22 (11/02 1100) BP: (112-184)/(65-133) 145/84 mmHg (11/02 1100) SpO2:  [94 %-100 %] 100 % (11/02 1100)  CBC:   Recent Labs Lab 06/11/15 1345  WBC 7.0  NEUTROABS 1.7  HGB 12.1  13.3  HCT 35.4*  39.0  MCV 88.5  PLT 353    Basic Metabolic Panel:   Recent Labs Lab 06/11/15 1345  NA 137  137  K 3.5  3.4*  CL 102  100*  CO2 24  GLUCOSE 131*  130*  BUN 16  19  CREATININE 0.82  0.80  CALCIUM 10.2    Lipid Panel:     Component Value Date/Time   CHOL 153 06/12/2015 0559   TRIG 139 06/12/2015 0559   HDL 39* 06/12/2015 0559   CHOLHDL 3.9 06/12/2015 0559   VLDL 28 06/12/2015 0559   LDLCALC 86 06/12/2015 0559   HgbA1c:  Lab Results  Component Value Date   HGBA1C 6.6* 06/12/2015   Urine Drug Screen:     Component Value Date/Time   LABOPIA NONE DETECTED 06/09/2013 1510   COCAINSCRNUR NONE DETECTED 06/09/2013 1510   LABBENZ NONE DETECTED 06/09/2013 1510   AMPHETMU NONE DETECTED 06/09/2013 1510   THCU NONE DETECTED 06/09/2013 1510   LABBARB NONE DETECTED 06/09/2013 1510      IMAGING  Ct Head Wo Contrast  06/12/2015 Stable non contrast CT appearance of the brain, chronic right MCA infarct. No acute cortically based infarct or intracranial hemorrhage identified.  06/11/2015  No acute intracranial abnormality. Specifically, no CT features of acute ischemia on the current study. No evidence for acute hemorrhage. Old posterior right frontal infarct.  06/11/2015   Remote right frontal lobe infarct with encephalomalacia and subsequent dilation of the right lateral ventricle. Question acute extension of infarct along the superior margin.   CTA HEAD    06/11/2015  Moderate narrowing right internal carotid artery cavernous segment. Occluded M1 segment of the right middle cerebral artery. This appears to have progressed since the prior MR angiogram (which was significantly motion degraded). Decrease number of visualized right middle cerebral artery branch vessels consistent with remote infarct and possibly acute infarct. Hypoplastic A1 segment right anterior cerebral artery. Mild irregularity and narrowing left internal carotid artery cavernous segment. Mild irregularity and narrowing left middle cerebral artery M1 segment. Left vertebral artery is dominant. Slight irregularity distal vertebral arteries. Narrowed irregular basilar artery.   CTA NECK   06/11/2015  Web-like narrowing proximal right internal carotid artery with less than 50% diameter stenosis. Ectatic distal left common carotid artery extends posterior and medial to the hyoid with hyoid compressing the anterior lateral aspect of the distal left common carotid artery with subsequent mild to slight moderate narrowing. No significant stenosis of the left carotid bifurcation. Left vertebral artery is dominant. Slight narrowing proximal left vertebral artery. Mild irregularity of the vertical segment of vertebral arteries bilaterally without high-grade stenosis. Scattered normal to top normal size lymph nodes more notable on the right. There may be coronary artery calcifications.   MRI brain pending  2D Echocardiogram  Left ventricle: The cavity size was normal. Systolic function wasvigorous. The estimated ejection fraction was in the range of 65%to 70%. Wall  motion was normal; there were no regional wallmotion abnormalities. The tissue Doppler parameters were normal.Left ventricular diastolic function parameters were normal. - Aortic valve: Trileaflet; normal thickness leaflets. There was noregurgitation. - Aortic root: The aortic root was normal in size. - Mitral valve: Structurally  normal valve. - Left atrium: The atrium was normal in size. - Right ventricle: The cavity size was normal. Wall thickness wasnormal. Systolic function was normal. - Right atrium: The atrium was normal in size. - Tricuspid valve: There was no regurgitation. - Pulmonic valve: There was no regurgitation. - Inferior vena cava: The vessel was normal in size. - Pericardium, extracardiac: There was no pericardial effusion. Impressions:  Normal study.  Venous Duplex Lower Ext.  Negative for deep and superficial vein thrombosis in both lower extremities.    PHYSICAL EXAM Pleasant frail middle aged african american lady not in distress. . Afebrile. Head is nontraumatic. Neck is supple without bruit.    Cardiac exam no murmur or gallop. Lungs are clear to auscultation. Distal pulses are well felt. Neurological Exam :  Awake alert severe dysarthria but can be understood with some difficulty. No aphasia. Oriented 3. Extraocular movements are full range without nystagmus. Decreased blink to threat on the left compared to the right. Moderate left lower facial weakness. Tongue midline. Motor system exam reveals left hemiparesis with grade 3/5 left upper extremity strength with weakness of left grip and intrinsic hand muscles. Grade 4/5 left lower extremity strength with weakness of hip flexors and ankle dorsiflexors. Tone is increased on the left compared to the right. Sensation appears symmetric bilaterally. Deep tendon reflexes are brisker on the left compared to the right. Left plantar is upgoing right is downgoing. Gait was not tested.   ASSESSMENT/PLAN [name] is a 49 y.o. female with history of prior stroke x 2, hypertension, hyperlipidemia, and asthma presenting with left sided weakness and right gaze deviation. She received IV t-PA, refused IR.   Stroke:  Non-dominant small right brain infarct (MRI pending), suspect  unknown embolic source  Resultant  Worsening Left  hemiparesis  MRI  pending   CTA head & neck intracranial atherosclerotic narrowing, occluded M1 at MCA. R sided top normal size lymph nodes  Post tPA CT without hemorrhage, age stroke  2D Echo  No source of embolus   LE venous doppler  negative  LDL 86  HgbA1c 6.6  SCDs for VTE prophylaxis DIET DYS 2 Room service appropriate?: Yes; Fluid consistency:: Thin  aspirin 325 mg daily and clopidogrel 75 mg daily prior to admission, now on aspirin 325 mg daily . Given stroke on aspirin, change to plavix. Pt agreeable.  Ongoing aggressive stroke risk factor management  Therapy recommendations:  CIR, rehab consult done  Disposition:  pending (rehad admissions coordinator following)  Transfer to floor, order written yesterday  PFO  Known PFO from previous stroke workup  PFO on TEE in March 2008 but TCD 2014 neg  Repeat TCD bubble study tomorrow afternoon. scheduled 3p  Hypertension  Stable  Hyperlipidemia  Home meds:  pravachol 20   LDL 86, goal < 70  Resume statin   Continue statin at discharge  Hyperglycemia  Glu 130s  HgbA1c pending, goal < 7.0  Other Stroke Risk Factors  Hx stroke/TIA  05/2013 - cryptogenic, posterior R external capsule and posterior R corona radiata, embolic  0258 cryptogenic, R frontal infarct (after 9 days of OCPs)  Decreased UOP  BUN/Cr normasl  Decreased UOP  Fluid bolus yest with increased IVF  Hospital day # [place] for Pager information 06/13/2015 1:14 PM    I have personally examined this patient. Reviewed chart and pertinent data and developed plan of care and agree with the above  Antony Contras, [place] Pager: [phone] 06/13/2015 1:14 PM    To contact Stroke Continuity provider, please refer to [email or web address] After hours, contact General Neurology

## 2015-06-13 NOTE — Evaluation (Addendum)
Physical Therapy Evaluation Patient Details Name: Rebecca Yoder MRN: [id number] DOB: [birth year] Today's Date: 06/13/2015   History of Present Illness  Patient is a 49 y/o female presents with left sided weakness and right gaze deviation. CTA - occluded M1. CT head-Remote right frontal lobe infarct with encephalomalacia and subsequent dilation of the right lateral ventricle. Question acute extension of infarct along the superior margin. s/p tPA. Awaiting MRI. PMH includes prior CVA, HTN, HLD.  Clinical Impression  Patient presents with left hemiparesis, impaired sensation, dysarthria and balance deficits s/p CVA impacting mobility. Pt independent PTA and highly motivated to return to PLOF. Sister is very supportive. Pt emotionally labile during session crying tears of joy. Tolerated sitting EOB working on facilitation of trunk and core musculature as well as posture and functional tasks. Transfer out of bed deferred per RN request due to awaiting MRI and doppler. Great candidate for CIR. Would benefit from CIR to maximize independence and mobility prior to return home. Will follow acutely.    Follow Up Recommendations CIR    Equipment Recommendations  Other (comment) (TBD.)    Recommendations for Other Services OT consult     Precautions / Restrictions Precautions Precautions: Fall Precaution Comments: left hemiparesis Restrictions Weight Bearing Restrictions: No      Mobility  Bed Mobility Overal bed mobility: Needs Assistance Bed Mobility: Supine to Sit;Sit to Supine;Rolling Rolling: Min assist   Supine to sit: Mod assist;+2 for physical assistance;HOB elevated Sit to supine: Max assist;+2 for physical assistance   General bed mobility comments: Assist to bring LLE to EOB, elevate trunk and scoot bottom to EOB. Assist to bring BLEs into bed and lower trunk. Rolling to R/L x4 to change pad and donn panties.  Transfers Overall transfer level:  (Deferred per RN to await  doppler and MRI results.)                  Ambulation/Gait                Stairs            Wheelchair Mobility    Modified Rankin (Stroke Patients Only) Modified Rankin (Stroke Patients Only) Pre-Morbid Rankin Score: No symptoms Modified Rankin: Severe disability     Balance Overall balance assessment: Needs assistance Sitting-balance support: Feet supported;Single extremity supported Sitting balance-Leahy Scale: Fair Sitting balance - Comments: Able to sit with UE support for short periods however during tasks or LE exercise, pt requiring Min A for trunk control and balance. Focused on upright posture and facilitating thoracic and lumbar extensors in seated position. Performed WB through [place] and side bending to facilitate right trunk flexors  Postural control: Posterior lean                                   Pertinent Vitals/Pain Pain Assessment: No/denies pain Faces Pain Scale: No hurt    Home Living Family/patient expects to be discharged to:: Inpatient rehab   Available Help at Discharge: Family;Available 24 hours/day (Sister reports she can provide 24/7 S at discharge if needed.)                  Prior Function Level of Independence: Independent         Comments: Works as a Astronomer. No reported deficits from prior CVA reported.      Hand Dominance   Dominant Hand: Right    Extremity/Trunk Assessment   Upper  Extremity Assessment: Defer to OT evaluation;LUE deficits/detail (Weak grip noted left hand. Able to grasp and extend digits purposefully.)       LUE Deficits / Details: Able to initiate finger to thumb with increased time.   Lower Extremity Assessment: LLE deficits/detail   LLE Deficits / Details: Grossly ~1/5 knee extension, 1/5 knee flexion, 2/5 hip flexion, 0/5 DF/PF.     Communication   Communication: Expressive difficulties (Dysarthria noted.)  Cognition Arousal/Alertness:  Awake/alert Behavior During Therapy: WFL for tasks assessed/performed (Emotionally labile- "tears of joy.") Overall Cognitive Status: Within Functional Limits for tasks assessed                      General Comments General comments (skin integrity, edema, etc.): Sister present in room during session. Lengthy discussion with pt and sister re: CIR, neuroplasticity and recovery process, exercises to perform etc.    Exercises        Assessment/Plan    PT Assessment Patient needs continued PT services  PT Diagnosis Hemiplegia non-dominant side   PT Problem List Decreased strength;Decreased range of motion;Impaired sensation;Decreased coordination;Decreased balance;Decreased mobility;Impaired tone  PT Treatment Interventions Balance training;Gait training;Functional mobility training;Therapeutic activities;Therapeutic exercise;Patient/family education;Wheelchair mobility training;Neuromuscular re-education   PT Goals (Current goals can be found in the Care Plan section) Acute Rehab PT Goals Patient Stated Goal: to go to rehab and return to independence PT Goal Formulation: With patient/family Time For Goal Achievement: 06/27/15 Potential to Achieve Goals: Good    Frequency Min 4X/week   Barriers to discharge        Co-evaluation               End of Session   Activity Tolerance: Patient tolerated treatment well Patient left: in bed;with call bell/phone within reach;with bed alarm set;with family/visitor present;with SCD's reapplied Nurse Communication: Mobility status         Time: 1657-9038 PT Time Calculation (min) (ACUTE ONLY): 30 min   Charges:   PT Evaluation $Initial PT Evaluation Tier I: 1 Procedure PT Treatments $Neuromuscular Re-education: 8-22 mins   PT G Codes:        Evelynne Spiers A Agusta Hackenberg 06/13/2015, 11:56 AM  Wray Kearns, PT, DPT [phone]

## 2015-06-13 NOTE — Progress Notes (Signed)
Pt has been incontinent several times this shift as well as previous shift.I encouraged her to use her call bell 10 minutes before she needed to use the bathroom to give me enough time to help her get onto the bedpan. However, she has been incontinent twice since reinforcement. She is also somewhat forgetful of staff names. On-call neurologist made aware.

## 2015-06-13 NOTE — Progress Notes (Signed)
Inpatient Rehabilitation  PT has requested a prescreen for possible IP Rehab.  Consult has already been completed and the admissions coordinator with follow up.    [facility] Admissions Coordinator Cell [phone] Office [phone]

## 2015-06-13 NOTE — Consult Note (Signed)
Physical Medicine and Rehabilitation Consult Reason for Consult: Right brain infarct Referring Physician: Dr. Leonie Man   HPI: Rebecca Yoder is a 49 y.o. right handed female with history of hypertension, previous right brain subcortical infarct October 2014 as well as remote right frontal insular infarct 2008 and MCA stenosis with known PFO maintained on aspirin 325 mg daily. Patient lives with her sister and mother who has cancer and undergoing treatment at this time. Independent prior to admission and driving. One level house with steps to entry Presented 06/11/2015 with sudden onset of left-sided weakness, dysarthria and right gaze deviation. Cranial CT scan negative for acute changes. Patient did receive TPA. CTA of head and neck shows occluded M1 segment of the right middle cerebral artery that appeared to have progressed since prior MR angiogram. Narrowing proximal right internal carotid artery with less than 50% diameter stenosis. MRI is pending. Echocardiogram with ejection fraction of 70% no wall motion abnormalities. Neurology consulted with workup ongoing plan aspirin and Plavix therapy. Dysphagia #2 thin liquid diet. Physical and occupational therapy evaluations pending. M.D. has requested physical medicine rehabilitation consult  Sister at bedside, states that she would be available for post hospital care. Patient lives with her sister  Review of Systems  Constitutional: Negative for fever and chills.  HENT: Negative for hearing loss.   Eyes: Negative for blurred vision and double vision.  Respiratory: Negative for cough and shortness of breath.   Cardiovascular: Negative for chest pain, palpitations and leg swelling.  Gastrointestinal: Positive for constipation. Negative for nausea, vomiting and abdominal pain.  Genitourinary: Negative for dysuria and flank pain.  Musculoskeletal: Positive for myalgias.  Skin: Negative for rash.  Neurological: Positive for weakness. Negative  for seizures, loss of consciousness and headaches.  All other systems reviewed and are negative.  Past Medical History  Diagnosis Date  . ASTHMA 12/10/2009  . CEREBROVASCULAR ACCIDENT, HX OF 12/10/2009  . HYPERLIPIDEMIA 12/10/2009  . HYPERTENSION 12/10/2009  . Complication of anesthesia     "just can't get me up" (06/09/2013)  . Stroke [facility]) 2008    denies residual on 06/09/2013  . Tendonitis of wrist, right    Past Surgical History  Procedure Laterality Date  . Ovarian cyst removal  1990's   History reviewed. No pertinent family history. Social History:  reports that she has been smoking Cigarettes.  She has been smoking about 0.50 packs per day. She has never used smokeless tobacco. She reports that she drinks alcohol. She reports that she does not use illicit drugs. Allergies:  Allergies  Allergen Reactions  . Penicillins Rash   Medications Prior to Admission  Medication Sig Dispense Refill  . amLODipine (NORVASC) 2.5 MG tablet TAKE ONE TABLET BY MOUTH ONCE DAILY 30 tablet 2  . aspirin 325 MG tablet Take 325 mg by mouth daily.    . Ibuprofen-Diphenhydramine Cit (ADVIL PM) 200-38 MG TABS Take 2 tablets by mouth at bedtime.    Marland Kitchen losartan-hydrochlorothiazide (HYZAAR) 100-12.5 MG per tablet TAKE ONE TABLET BY MOUTH ONCE DAILY 90 tablet 2  . Multiple Vitamin (MULTIVITAMIN WITH MINERALS) TABS tablet Take 1 tablet by mouth daily.    . pravastatin (PRAVACHOL) 20 MG tablet TAKE ONE TABLET BY MOUTH ONCE DAILY 30 tablet 2  . acetaminophen (TYLENOL) 325 MG tablet Take 2 tablets (650 mg total) by mouth every 6 (six) hours as needed. 20 tablet 0  . albuterol (PROAIR HFA) 108 (90 BASE) MCG/ACT inhaler Inhale 2 puffs into the lungs every  6 (six) hours as needed for wheezing. 9 g 5  . clopidogrel (PLAVIX) 75 MG tablet TAKE ONE TABLET BY MOUTH ONCE DAILY (Patient not taking: Reported on 06/11/2015) 90 tablet 1  . predniSONE (DELTASONE) 20 MG tablet Take 2 tablets (40 mg total) by mouth daily with  breakfast. (Patient not taking: Reported on 06/11/2015) 8 tablet 0    Home: Home Living Available Help at Discharge: Family, Available 24 hours/day  Lives With: Family  Functional History:   Functional Status:  Mobility:          ADL:    Cognition: Cognition Overall Cognitive Status: Within Functional Limits for tasks assessed Orientation Level: Oriented X4 Cognition Overall Cognitive Status: Within Functional Limits for tasks assessed  Blood pressure 133/70, pulse 76, temperature 98.6 F (37 C), temperature source Oral, resp. rate 19, height 5\' 10"  (1.778 m), weight 83 kg (182 lb 15.7 oz), last menstrual period 05/14/2015, SpO2 98 %. Physical Exam  HENT:  Right gaze preference  Eyes:  Pupil reactive to light  Neck: Normal range of motion. Neck supple. No thyromegaly present.  Cardiovascular: Normal rate and regular rhythm.   Respiratory: Effort normal and breath sounds normal. No respiratory distress.  GI: Soft. Bowel sounds are normal. She exhibits no distension.  Neurological: She is alert.  Dysarthric speech but intelligible. Emotional lability. Oriented to person and place as well as date of birth. Fair awareness of deficits. Follows simple commands  Skin: Skin is warm and dry.  Visual fields difficult to assess due to poor cooperation with confrontation testing Tactile neglect on the left side in the left upper and lower limb Motor strength is 2 minus in the left deltoid, biceps, triceps, grip, finger flexors, finger extensors 3 minus/5 strength in the left hip flexor and knee extensor to minus and ankle dorsiflexor and plantar flexor 4/5 in the right deltoid, biceps, triceps, grip, hip flexor, knee extensor, ankle dorsi flexors and plantar flexor Sensory is able identify light touch in the left hand and left foot however she notes that the sensation does not feel same as on the right side.   No results found for this or any previous visit (from the past 24  hour(s)). Ct Angio Head W/cm &/or Wo Cm  06/11/2015  CLINICAL DATA:  49 year old hypertensive female with hyperlipidemia and history of prior stroke presenting with sudden onset of left-sided paralysis and slurred speech. Subsequent encounter. EXAM: CT ANGIOGRAPHY HEAD AND NECK TECHNIQUE: Multidetector CT imaging of the head and neck was performed using the standard protocol during bolus administration of intravenous contrast. Multiplanar CT image reconstructions and MIPs were obtained to evaluate the vascular anatomy. Carotid stenosis measurements (when applicable) are obtained utilizing NASCET criteria, using the distal internal carotid diameter as the denominator. CONTRAST:  72mL OMNIPAQUE IOHEXOL 350 MG/ML SOLN COMPARISON:  06/11/2015 and 06/09/2013 unenhanced head CT. 06/09/2013 brain MR and MR angiogram. FINDINGS: CT HEAD Brain: Remote right frontal lobe infarct with encephalomalacia and subsequent dilation of the right lateral ventricle. Question acute extension of infarct along the superior margin. No intracranial hemorrhage. No intracranial mass or abnormal enhancement. Calvarium and skull base: Negative. Paranasal sinuses: Clear. Orbits: Negative. Prominent cerumen right ear. CTA NECK Aortic arch: 2 vessel aortic arch with mild ectasia. Pulsation artifact limits evaluation of the ascending thoracic aorta. Right carotid system: Web-like narrowing proximal right internal carotid artery with less than 50% diameter stenosis. Left carotid system: Ectatic left common carotid artery. The distal left common carotid artery extends posterior and medial  to the hyoid with hyoid compressing the anterior lateral aspect of the distal left common carotid artery with subsequent mild to slight moderate narrowing. No significant stenosis of the left carotid bifurcation Vertebral arteries:Mild narrowing distal left subclavian artery. Left vertebral artery is dominant. Slight narrowing proximal left vertebral artery. Mild  irregularity of the vertical segment of vertebral arteries bilaterally without high-grade stenosis. Skeleton: Mild cervical spondylotic changes most prominent C5-6. Other neck: No worrisome primary neck mass. Scattered normal to top normal size lymph nodes more notable on the right. No worrisome lung apical lesion. There may be a coronary artery calcifications. CTA HEAD Anterior circulation: Moderate narrowing right internal carotid artery cavernous segment. Fetal type contribution to the right posterior cerebral artery. Occluded M1 segment of the right middle cerebral artery. This appears to have progressed since the prior MR angiogram (which was significantly motion degraded). Decrease number of visualized right middle cerebral artery branch vessels consistent with remote infarct and possibly acute infarct. Hypoplastic A1 segment right anterior cerebral artery. Mild irregularity and narrowing left internal carotid artery cavernous segment. Mild irregularity and narrowing left middle cerebral artery M1 segment. Posterior circulation: Left vertebral artery is dominant. Slight irregularity distal vertebral arteries. Narrowed irregular basilar artery. Venous sinuses: Patent. Probable artifact within the non dominant left jugular bulb and left internal jugular vein. Anatomic variants: As above. Delayed phase: As above IMPRESSION: CT HEAD Remote right frontal lobe infarct with encephalomalacia and subsequent dilation of the right lateral ventricle. Question acute extension of infarct along the superior margin. CTA HEAD Moderate narrowing right internal carotid artery cavernous segment. Occluded M1 segment of the right middle cerebral artery. This appears to have progressed since the prior MR angiogram (which was significantly motion degraded). Decrease number of visualized right middle cerebral artery branch vessels consistent with remote infarct and possibly acute infarct. Hypoplastic A1 segment right anterior cerebral  artery. Mild irregularity and narrowing left internal carotid artery cavernous segment. Mild irregularity and narrowing left middle cerebral artery M1 segment. Left vertebral artery is dominant. Slight irregularity distal vertebral arteries. Narrowed irregular basilar artery. CTA NECK Web-like narrowing proximal right internal carotid artery with less than 50% diameter stenosis. Ectatic distal left common carotid artery extends posterior and medial to the hyoid with hyoid compressing the anterior lateral aspect of the distal left common carotid artery with subsequent mild to slight moderate narrowing. No significant stenosis of the left carotid bifurcation. Left vertebral artery is dominant. Slight narrowing proximal left vertebral artery. Mild irregularity of the vertical segment of vertebral arteries bilaterally without high-grade stenosis. Scattered normal to top normal size lymph nodes more notable on the right. There may be coronary artery calcifications. Electronically Signed   By: Genia Del M.D.   On: 06/11/2015 15:39   Ct Head Wo Contrast  06/12/2015  CLINICAL DATA:  49 year old female code stroke patient with slurred speech and left side weakness. Frontal headache. Chronic right MCA vessel disease in infarct. Initial encounter. EXAM: CT HEAD WITHOUT CONTRAST TECHNIQUE: Contiguous axial images were obtained from the base of the skull through the vertex without intravenous contrast. COMPARISON:  CTA head and neck 06/11/2015, and earlier. FINDINGS: Visualized paranasal sinuses and mastoids are clear. No acute osseous abnormality identified. Rightward gaze deviation. No acute scalp or orbits soft tissue finding. Chronic right insula and operculum encephalomalacia. This appears stable since 2014. No superimposed acute right hemisphere cortically based infarct is identified. No acute intracranial hemorrhage identified. No midline shift, mass effect, or evidence of intracranial mass lesion. Stable ventricle  size and configuration. Stable and normal left hemisphere gray-white matter differentiation. IMPRESSION: Stable non contrast CT appearance of the brain, chronic right MCA infarct. No acute cortically based infarct or intracranial hemorrhage identified. Electronically Signed   By: Genevie Ann M.D.   On: 06/12/2015 13:38   Ct Head Wo Contrast  06/11/2015  CLINICAL DATA:  Initial encounter for unresponsiveness. Code stroke. EXAM: CT HEAD WITHOUT CONTRAST TECHNIQUE: Contiguous axial images were obtained from the base of the skull through the vertex without intravenous contrast. COMPARISON:  06/09/2013. FINDINGS: Imaging planes are oblique. Old posterior right frontal infarct is unchanged in the interval. No evidence for acute hemorrhage, hydrocephalus, mass lesion, or abnormal extra-axial fluid collection on today's exam. No CT evidence for acute ischemia. The visualized paranasal sinuses and mastoid air cells are clear. IMPRESSION: No acute intracranial abnormality. Specifically, no CT features of acute ischemia on the current study. No evidence for acute hemorrhage. Old posterior right frontal infarct. Findings discussed with Dr. Leonel Ramsay at approximately 1400 hours on 06/11/2015. Electronically Signed   By: Misty Stanley M.D.   On: 06/11/2015 14:13   Ct Angio Neck W/cm &/or Wo/cm  06/11/2015  CLINICAL DATA:  49 year old hypertensive female with hyperlipidemia and history of prior stroke presenting with sudden onset of left-sided paralysis and slurred speech. Subsequent encounter. EXAM: CT ANGIOGRAPHY HEAD AND NECK TECHNIQUE: Multidetector CT imaging of the head and neck was performed using the standard protocol during bolus administration of intravenous contrast. Multiplanar CT image reconstructions and MIPs were obtained to evaluate the vascular anatomy. Carotid stenosis measurements (when applicable) are obtained utilizing NASCET criteria, using the distal internal carotid diameter as the denominator.  CONTRAST:  20mL OMNIPAQUE IOHEXOL 350 MG/ML SOLN COMPARISON:  06/11/2015 and 06/09/2013 unenhanced head CT. 06/09/2013 brain MR and MR angiogram. FINDINGS: CT HEAD Brain: Remote right frontal lobe infarct with encephalomalacia and subsequent dilation of the right lateral ventricle. Question acute extension of infarct along the superior margin. No intracranial hemorrhage. No intracranial mass or abnormal enhancement. Calvarium and skull base: Negative. Paranasal sinuses: Clear. Orbits: Negative. Prominent cerumen right ear. CTA NECK Aortic arch: 2 vessel aortic arch with mild ectasia. Pulsation artifact limits evaluation of the ascending thoracic aorta. Right carotid system: Web-like narrowing proximal right internal carotid artery with less than 50% diameter stenosis. Left carotid system: Ectatic left common carotid artery. The distal left common carotid artery extends posterior and medial to the hyoid with hyoid compressing the anterior lateral aspect of the distal left common carotid artery with subsequent mild to slight moderate narrowing. No significant stenosis of the left carotid bifurcation Vertebral arteries:Mild narrowing distal left subclavian artery. Left vertebral artery is dominant. Slight narrowing proximal left vertebral artery. Mild irregularity of the vertical segment of vertebral arteries bilaterally without high-grade stenosis. Skeleton: Mild cervical spondylotic changes most prominent C5-6. Other neck: No worrisome primary neck mass. Scattered normal to top normal size lymph nodes more notable on the right. No worrisome lung apical lesion. There may be a coronary artery calcifications. CTA HEAD Anterior circulation: Moderate narrowing right internal carotid artery cavernous segment. Fetal type contribution to the right posterior cerebral artery. Occluded M1 segment of the right middle cerebral artery. This appears to have progressed since the prior MR angiogram (which was significantly motion  degraded). Decrease number of visualized right middle cerebral artery branch vessels consistent with remote infarct and possibly acute infarct. Hypoplastic A1 segment right anterior cerebral artery. Mild irregularity and narrowing left internal carotid artery cavernous segment. Mild irregularity and narrowing  left middle cerebral artery M1 segment. Posterior circulation: Left vertebral artery is dominant. Slight irregularity distal vertebral arteries. Narrowed irregular basilar artery. Venous sinuses: Patent. Probable artifact within the non dominant left jugular bulb and left internal jugular vein. Anatomic variants: As above. Delayed phase: As above IMPRESSION: CT HEAD Remote right frontal lobe infarct with encephalomalacia and subsequent dilation of the right lateral ventricle. Question acute extension of infarct along the superior margin. CTA HEAD Moderate narrowing right internal carotid artery cavernous segment. Occluded M1 segment of the right middle cerebral artery. This appears to have progressed since the prior MR angiogram (which was significantly motion degraded). Decrease number of visualized right middle cerebral artery branch vessels consistent with remote infarct and possibly acute infarct. Hypoplastic A1 segment right anterior cerebral artery. Mild irregularity and narrowing left internal carotid artery cavernous segment. Mild irregularity and narrowing left middle cerebral artery M1 segment. Left vertebral artery is dominant. Slight irregularity distal vertebral arteries. Narrowed irregular basilar artery. CTA NECK Web-like narrowing proximal right internal carotid artery with less than 50% diameter stenosis. Ectatic distal left common carotid artery extends posterior and medial to the hyoid with hyoid compressing the anterior lateral aspect of the distal left common carotid artery with subsequent mild to slight moderate narrowing. No significant stenosis of the left carotid bifurcation. Left  vertebral artery is dominant. Slight narrowing proximal left vertebral artery. Mild irregularity of the vertical segment of vertebral arteries bilaterally without high-grade stenosis. Scattered normal to top normal size lymph nodes more notable on the right. There may be coronary artery calcifications. Electronically Signed   By: Genia Del M.D.   On: 06/11/2015 15:39    Assessment/Plan: Diagnosis: Right MCA infarct with left hemiparesis and left neglect 1. Does the need for close, 24 hr/day medical supervision in concert with the patient's rehab needs make it unreasonable for this patient to be served in a less intensive setting? Yes 2. Co-Morbidities requiring supervision/potential complications: Prior right subcortical infarct, PFO, hypertension, asthma 3. Due to bladder management, bowel management, safety, skin/wound care, disease management, medication administration, pain management and patient education, does the patient require 24 hr/day rehab nursing? Yes 4. Does the patient require coordinated care of a physician, rehab nurse, PT (1-2 hrs/day, 5 days/week), OT (1-2 hrs/day, 5 days/week) and SLP (0.5-1 hrs/day, 5 days/week) to address physical and functional deficits in the context of the above medical diagnosis(es)? Yes Addressing deficits in the following areas: balance, endurance, locomotion, strength, transferring, bowel/bladder control, bathing, dressing, feeding, grooming, toileting, cognition, swallowing and psychosocial support 5. Can the patient actively participate in an intensive therapy program of at least 3 hrs of therapy per day at least 5 days per week? Yes 6. The potential for patient to make measurable gains while on inpatient rehab is excellent 7. Anticipated functional outcomes upon discharge from inpatient rehab are supervision and min assist  with PT, supervision and min assist with OT, supervision with SLP. 8. Estimated rehab length of stay to reach the above  functional goals is: 18-21 days 9. Does the patient have adequate social supports and living environment to accommodate these discharge functional goals? Yes 10. Anticipated D/C setting: Home 11. Anticipated post D/C treatments: Shedd therapy 12. Overall Rehab/Functional Prognosis: excellent  RECOMMENDATIONS: This patient's condition is appropriate for continued rehabilitative care in the following setting: CIR Patient has agreed to participate in recommended program. Yes Note that insurance prior authorization may be required for reimbursement for recommended care.  Comment: Needs PT and OT evaluations  06/13/2015  

## 2015-06-14 ENCOUNTER — Inpatient Hospital Stay (HOSPITAL_COMMUNITY)
Admission: RE | Admit: 2015-06-14 | Discharge: 2015-07-04 | DRG: 057 | Disposition: A | Discharge status: Home Health | Payer: Medicaid Other | Source: Intra-hospital | Attending: Physical Medicine & Rehabilitation | Admitting: Physical Medicine & Rehabilitation

## 2015-06-14 DIAGNOSIS — R6889 Other general symptoms and signs: Secondary | ICD-10-CM | POA: Diagnosis not present

## 2015-06-14 DIAGNOSIS — R51 Headache: Secondary | ICD-10-CM | POA: Diagnosis not present

## 2015-06-14 DIAGNOSIS — R1310 Dysphagia, unspecified: Secondary | ICD-10-CM | POA: Diagnosis present

## 2015-06-14 DIAGNOSIS — R414 Neurologic neglect syndrome: Secondary | ICD-10-CM | POA: Diagnosis not present

## 2015-06-14 DIAGNOSIS — Q211 Atrial septal defect: Secondary | ICD-10-CM | POA: Diagnosis not present

## 2015-06-14 DIAGNOSIS — K5900 Constipation, unspecified: Secondary | ICD-10-CM | POA: Diagnosis present

## 2015-06-14 DIAGNOSIS — I69391 Dysphagia following cerebral infarction: Secondary | ICD-10-CM | POA: Diagnosis not present

## 2015-06-14 DIAGNOSIS — E785 Hyperlipidemia, unspecified: Secondary | ICD-10-CM | POA: Diagnosis present

## 2015-06-14 DIAGNOSIS — D649 Anemia, unspecified: Secondary | ICD-10-CM | POA: Diagnosis present

## 2015-06-14 DIAGNOSIS — E876 Hypokalemia: Secondary | ICD-10-CM | POA: Diagnosis present

## 2015-06-14 DIAGNOSIS — I63511 Cerebral infarction due to unspecified occlusion or stenosis of right middle cerebral artery: Secondary | ICD-10-CM | POA: Diagnosis not present

## 2015-06-14 DIAGNOSIS — G8194 Hemiplegia, unspecified affecting left nondominant side: Secondary | ICD-10-CM | POA: Diagnosis present

## 2015-06-14 DIAGNOSIS — I10 Essential (primary) hypertension: Secondary | ICD-10-CM | POA: Diagnosis present

## 2015-06-14 DIAGNOSIS — M25512 Pain in left shoulder: Secondary | ICD-10-CM

## 2015-06-14 DIAGNOSIS — F17210 Nicotine dependence, cigarettes, uncomplicated: Secondary | ICD-10-CM | POA: Diagnosis present

## 2015-06-14 DIAGNOSIS — F0150 Vascular dementia without behavioral disturbance: Secondary | ICD-10-CM | POA: Diagnosis present

## 2015-06-14 DIAGNOSIS — I69354 Hemiplegia and hemiparesis following cerebral infarction affecting left non-dominant side: Secondary | ICD-10-CM | POA: Diagnosis present

## 2015-06-14 DIAGNOSIS — D62 Acute posthemorrhagic anemia: Secondary | ICD-10-CM | POA: Diagnosis not present

## 2015-06-14 DIAGNOSIS — I69322 Dysarthria following cerebral infarction: Secondary | ICD-10-CM | POA: Diagnosis not present

## 2015-06-14 DIAGNOSIS — J45909 Unspecified asthma, uncomplicated: Secondary | ICD-10-CM | POA: Diagnosis present

## 2015-06-14 DIAGNOSIS — R4586 Emotional lability: Secondary | ICD-10-CM | POA: Diagnosis present

## 2015-06-14 MED ORDER — CLOPIDOGREL BISULFATE 75 MG PO TABS
75.0000 mg | ORAL_TABLET | Freq: Every day | ORAL | Status: DC
  Administered 2015-06-15 – 2015-07-04 (×20): 75 mg via ORAL
  Filled 2015-06-14 (×20): qty 1

## 2015-06-14 MED ORDER — ACETAMINOPHEN 650 MG RE SUPP
650.0000 mg | RECTAL | Status: DC | PRN
  Filled 2015-06-14: qty 1

## 2015-06-14 MED ORDER — PANTOPRAZOLE SODIUM 40 MG PO PACK
40.0000 mg | PACK | Freq: Every day | ORAL | Status: DC
  Administered 2015-06-15 – 2015-06-18 (×4): 40 mg
  Filled 2015-06-14 (×4): qty 20

## 2015-06-14 MED ORDER — AMLODIPINE BESYLATE 2.5 MG PO TABS
2.5000 mg | ORAL_TABLET | Freq: Every day | ORAL | Status: DC
  Administered 2015-06-15 – 2015-06-26 (×12): 2.5 mg via ORAL
  Filled 2015-06-14 (×12): qty 1

## 2015-06-14 MED ORDER — SENNOSIDES-DOCUSATE SODIUM 8.6-50 MG PO TABS: 1.0000 | ORAL_TABLET | Freq: Every evening | ORAL | Status: DC | PRN

## 2015-06-14 MED ORDER — LOSARTAN POTASSIUM 50 MG PO TABS
100.0000 mg | ORAL_TABLET | Freq: Every day | ORAL | Status: DC
  Administered 2015-06-14: 100 mg via ORAL
  Filled 2015-06-14: qty 2

## 2015-06-14 MED ORDER — ONDANSETRON HCL 4 MG PO TABS
4.0000 mg | ORAL_TABLET | Freq: Four times a day (QID) | ORAL | Status: DC | PRN
  Filled 2015-06-14: qty 1

## 2015-06-14 MED ORDER — ADULT MULTIVITAMIN W/MINERALS CH
1.0000 | ORAL_TABLET | Freq: Every day | ORAL | Status: DC
  Administered 2015-06-14: 1 via ORAL
  Filled 2015-06-14: qty 1

## 2015-06-14 MED ORDER — HYDROCHLOROTHIAZIDE 12.5 MG PO CAPS
12.5000 mg | ORAL_CAPSULE | Freq: Every day | ORAL | Status: DC
  Administered 2015-06-15 – 2015-07-04 (×20): 12.5 mg via ORAL
  Filled 2015-06-14 (×20): qty 1

## 2015-06-14 MED ORDER — LOSARTAN POTASSIUM 50 MG PO TABS
100.0000 mg | ORAL_TABLET | Freq: Every day | ORAL | Status: DC
  Administered 2015-06-15 – 2015-07-04 (×20): 100 mg via ORAL
  Filled 2015-06-14 (×20): qty 2

## 2015-06-14 MED ORDER — ACETAMINOPHEN 325 MG PO TABS
650.0000 mg | ORAL_TABLET | ORAL | Status: DC | PRN
  Administered 2015-06-15 – 2015-07-04 (×29): 650 mg via ORAL
  Filled 2015-06-14 (×36): qty 2

## 2015-06-14 MED ORDER — ADULT MULTIVITAMIN W/MINERALS CH
1.0000 | ORAL_TABLET | Freq: Every day | ORAL | Status: DC
  Administered 2015-06-15 – 2015-07-04 (×20): 1 via ORAL
  Filled 2015-06-14 (×20): qty 1

## 2015-06-14 MED ORDER — LOSARTAN POTASSIUM-HCTZ 100-12.5 MG PO TABS: 1.0000 | ORAL_TABLET | Freq: Every day | ORAL | Status: DC

## 2015-06-14 MED ORDER — ONDANSETRON HCL 4 MG/2ML IJ SOLN: 4.0000 mg | Freq: Four times a day (QID) | INTRAMUSCULAR | Status: DC | PRN

## 2015-06-14 MED ORDER — SORBITOL 70 % SOLN
30.0000 mL | Freq: Every day | Status: DC | PRN
  Administered 2015-06-20 – 2015-06-28 (×3): 30 mL via ORAL
  Filled 2015-06-14 (×3): qty 30

## 2015-06-14 MED ORDER — AMLODIPINE BESYLATE 2.5 MG PO TABS
2.5000 mg | ORAL_TABLET | Freq: Every day | ORAL | Status: DC
  Administered 2015-06-14: 2.5 mg via ORAL
  Filled 2015-06-14: qty 1

## 2015-06-14 MED ORDER — PRAVASTATIN SODIUM 20 MG PO TABS
20.0000 mg | ORAL_TABLET | Freq: Every day | ORAL | Status: DC
  Administered 2015-06-15 – 2015-07-04 (×20): 20 mg via ORAL
  Filled 2015-06-14 (×20): qty 1

## 2015-06-14 MED ORDER — HYDROCHLOROTHIAZIDE 12.5 MG PO CAPS
12.5000 mg | ORAL_CAPSULE | Freq: Every day | ORAL | Status: DC
  Administered 2015-06-14: 12.5 mg via ORAL
  Filled 2015-06-14: qty 1

## 2015-06-14 MED ORDER — SODIUM CHLORIDE 0.9 % IV SOLN
INTRAVENOUS | Status: DC
  Administered 2015-06-14: 10:00:00 via INTRAVENOUS

## 2015-06-14 MED ORDER — TRAZODONE HCL 50 MG PO TABS
25.0000 mg | ORAL_TABLET | Freq: Every evening | ORAL | Status: DC | PRN
  Administered 2015-06-14 – 2015-07-03 (×10): 25 mg via ORAL
  Filled 2015-06-14 (×10): qty 1

## 2015-06-14 NOTE — Progress Notes (Signed)
STROKE TEAM PROGRESS NOTE   SUBJECTIVE (INTERVAL HISTORY) No complaints - pt with menses now, irregular at home. Denied incontinence during the night, only that she could not get up fast enough.    OBJECTIVE Temp:  [98.4 F (36.9 C)-99 F (37.2 C)] 98.6 F (37 C) (11/03 0832) Pulse Rate:  [59-95] 72 (11/03 0832) Cardiac Rhythm:  [-] Normal sinus rhythm (11/03 0834) Resp:  [14-24] 16 (11/03 0832) BP: (109-192)/(62-128) 152/77 mmHg (11/03 0832) SpO2:  [94 %-100 %] 100 % (11/03 0832)  CBC:   Recent Labs Lab 06/11/15 1345  WBC 7.0  NEUTROABS 1.7  HGB 12.1  13.3  HCT 35.4*  39.0  MCV 88.5  PLT 497   Basic Metabolic Panel:   Recent Labs Lab 06/11/15 1345  NA 137  137  K 3.5  3.4*  CL 102  100*  CO2 24  GLUCOSE 131*  130*  BUN 16  19  CREATININE 0.82  0.80  CALCIUM 10.2   Lipid Panel:     Component Value Date/Time   CHOL 153 06/12/2015 0559   TRIG 139 06/12/2015 0559   HDL 39* 06/12/2015 0559   CHOLHDL 3.9 06/12/2015 0559   VLDL 28 06/12/2015 0559   LDLCALC 86 06/12/2015 0559   HgbA1c:  Lab Results  Component Value Date   HGBA1C 6.6* 06/12/2015   Urine Drug Screen:     Component Value Date/Time   LABOPIA NONE DETECTED 06/09/2013 1510   COCAINSCRNUR NONE DETECTED 06/09/2013 1510   LABBENZ NONE DETECTED 06/09/2013 1510   AMPHETMU NONE DETECTED 06/09/2013 1510   THCU NONE DETECTED 06/09/2013 1510   LABBARB NONE DETECTED 06/09/2013 1510     IMAGING  Ct Head Wo Contrast  06/12/2015 Stable non contrast CT appearance of the brain, chronic right MCA infarct. No acute cortically based infarct or intracranial hemorrhage identified.  06/11/2015  No acute intracranial abnormality. Specifically, no CT features of acute ischemia on the current study. No evidence for acute hemorrhage. Old posterior right frontal infarct.  06/11/2015   Remote right frontal lobe infarct with encephalomalacia and subsequent dilation of the right lateral ventricle. Question  acute extension of infarct along the superior margin.   CTA HEAD   06/11/2015  Moderate narrowing right internal carotid artery cavernous segment. Occluded M1 segment of the right middle cerebral artery. This appears to have progressed since the prior MR angiogram (which was significantly motion degraded). Decrease number of visualized right middle cerebral artery branch vessels consistent with remote infarct and possibly acute infarct. Hypoplastic A1 segment right anterior cerebral artery. Mild irregularity and narrowing left internal carotid artery cavernous segment. Mild irregularity and narrowing left middle cerebral artery M1 segment. Left vertebral artery is dominant. Slight irregularity distal vertebral arteries. Narrowed irregular basilar artery.   CTA NECK   06/11/2015  Web-like narrowing proximal right internal carotid artery with less than 50% diameter stenosis. Ectatic distal left common carotid artery extends posterior and medial to the hyoid with hyoid compressing the anterior lateral aspect of the distal left common carotid artery with subsequent mild to slight moderate narrowing. No significant stenosis of the left carotid bifurcation. Left vertebral artery is dominant. Slight narrowing proximal left vertebral artery. Mild irregularity of the vertical segment of vertebral arteries bilaterally without high-grade stenosis. Scattered normal to top normal size lymph nodes more notable on the right. There may be coronary artery calcifications.   MRI brain  06/13/2015 1. Progressive nonhemorrhagic infarct involving the right basal ganglia as described. There is no extension  into the right frontal lobe. 2. Remote anterior right MCA territory infarct involving the insular cortex aunt more anterior aspect of the basal ganglia. 3. Distal right MCA occlusion.  2D Echocardiogram  Left ventricle: The cavity size was normal. Systolic function wasvigorous. The estimated ejection fraction was in the  range of 65%to 70%. Wall motion was normal; there were no regional wallmotion abnormalities. The tissue Doppler parameters were normal.Left ventricular diastolic function parameters were normal. - Aortic valve: Trileaflet; normal thickness leaflets. There was noregurgitation. - Aortic root: The aortic root was normal in size. - Mitral valve: Structurally normal valve. - Left atrium: The atrium was normal in size. - Right ventricle: The cavity size was normal. Wall thickness wasnormal. Systolic function was normal. - Right atrium: The atrium was normal in size. - Tricuspid valve: There was no regurgitation. - Pulmonic valve: There was no regurgitation. - Inferior vena cava: The vessel was normal in size. - Pericardium, extracardiac: There was no pericardial effusion. Impressions:  Normal study.  Venous Duplex Lower Ext.  Negative for deep and superficial vein thrombosis in both lower extremities.    PHYSICAL EXAM Pleasant frail middle aged african american lady not in distress. . Afebrile. Head is nontraumatic. Neck is supple without bruit.    Cardiac exam no murmur or gallop. Lungs are clear to auscultation. Distal pulses are well felt. Neurological Exam :  Awake alert severe dysarthria but can be understood with some difficulty. No aphasia. Oriented 3. Extraocular movements are full range without nystagmus. Decreased blink to threat on the left compared to the right. Moderate left lower facial weakness. Tongue midline. Motor system exam reveals left hemiparesis with grade 3/5 left upper extremity strength with weakness of left grip and intrinsic hand muscles. Grade 4/5 left lower extremity strength with weakness of hip flexors and ankle dorsiflexors. Tone is increased on the left compared to the right. Sensation appears symmetric bilaterally. Deep tendon reflexes are brisker on the left compared to the right. Left plantar is upgoing right is downgoing. Gait was not  tested.   ASSESSMENT/PLAN Ms. EARLENA WERST is a 49 y.o. female with history of prior stroke x 2, hypertension, hyperlipidemia, and asthma presenting with left sided weakness and right gaze deviation. She received IV t-PA, refused IR.   Stroke:  Non-dominant small right brain infarct (MRI pending), suspect  unknown embolic source  Resultant  Worsening Left hemiparesis  MRI  Progressive R BG infarct  CTA head & neck intracranial atherosclerotic narrowing, occluded M1 at MCA. R sided top normal size lymph nodes  Post tPA CT without hemorrhage, age stroke  2D Echo  No source of embolus   LE venous doppler  negative  LDL 86  HgbA1c 6.6  SCDs for VTE prophylaxis DIET DYS 2 Room service appropriate?: Yes; Fluid consistency:: Thin  aspirin 325 mg daily and clopidogrel 75 mg daily prior to admission, changed to clopidogrel 75 mg daily   Ongoing aggressive stroke risk factor management  Therapy recommendations:  CIR, rehab consult in place  Disposition:  pending (rehab admissions coordinator following)  Transferred to floor early am 11/3  PFO  Known PFO from previous stroke workup  PFO on TEE in March 2008 but TCD 2014 neg  Repeat TCD bubble study, Levis Nazir could not do yesterday, hopes to do later this afternoon.  Hypertension  Stable  Hyperlipidemia  Home meds:  pravachol 20   LDL 86, goal < 70  Resume statin   Continue statin at discharge  Hyperglycemia  Glu  130s  HgbA1c 6.6, at goal < 7.0  Other Stroke Risk Factors  Hx stroke/TIA  05/2013 - cryptogenic, posterior R external capsule and posterior R corona radiata, embolic  6803 cryptogenic, R frontal infarct (after 9 days of OCPs)  Decreased UOP  BUN/Cr normal  Decreased UOP  txd with Fluid bolus with increased IVF 11/1  Denied incontinence  Reports increased OP  Will Check UA if continued problems  Hospital day # [place] for Pager  information 06/14/2015 9:32 AM  I have personally examined this patient, reviewed notes, independently viewed imaging studies, participated in medical decision making and plan of care. I have made any additions or clarifications directly to the above note. Agree with note above. Medically stable to be transferred to inpatient rehabilitation after insurance approval if bed available  Antony Contras, [place] Pager: [phone] 06/14/2015 10:22 AM   To contact Stroke Continuity provider, please refer to [email or web address] After hours, contact General Neurology

## 2015-06-14 NOTE — Progress Notes (Signed)
Patient ID: Rebecca Yoder, female   DOB: 02/01/1966, 49 y.o.   MRN: [id number] Patient arrived from [facility] with RN and belongings. Patient oriented to room, rehab process, fall prevention plan, rehab safety plan and call bell system. Patient has been vaccinated with Flu vaccine for 2016. Patient is resting comfortably in bed with no c/o of pain and family at bedside. Will continue to monitor.

## 2015-06-14 NOTE — PMR Pre-admission (Signed)
PMR Admission Coordinator Pre-Admission Assessment  Patient: Rebecca Yoder is an 49 y.o., female MRN: [id number] DOB: [birth year] Height: 5\' 10"  (177.8 cm) Weight: 83 kg (182 lb 15.7 oz)              Insurance Information Self pay - no insurance.  Did have [employer] plan but plan terminated 09/10/14.  No insurance with current employer.  Medicaid Application Date:        Case Manager:   Disability Application Date:        Case Worker:    Emergency Contact Information Contact Information    Name Relation Home Work Mobile   [place] Sister   [phone]   Rhian, Asebedo Mother [phone]     Charna Archer [phone]       Current Medical History  Patient Admitting Diagnosis: Right MCA infarct with left hemiparesis and left neglect   History of Present Illness:  A 49 y.o. right handed female with history of hypertension, previous right brain subcortical infarct October 2014 as well as remote right frontal insular infarct 2008 and MCA stenosis with known PFO maintained on aspirin 325 mg daily. Patient lives with her sister and mother who has cancer and undergoing treatment at this time. Independent prior to admission and driving. One level house with steps to entry Presented 06/11/2015 with sudden onset of left-sided weakness, dysarthria and right gaze deviation. Cranial CT scan negative for acute changes. Patient did receive TPA. CTA of head and neck shows occluded M1 segment of the right middle cerebral artery that appeared to have progressed since prior MR angiogram. Narrowing proximal right internal carotid artery with less than 50% diameter stenosis. MRI is pending. Echocardiogram with ejection fraction of 70% no wall motion abnormalities. Neurology consulted with workup ongoing plan aspirin and Plavix therapy. Dysphagia #2 thin liquid diet. Physical and occupational therapy evaluations pending. M.D. has requested physical medicine rehabilitation consult.  Total: 9=NIH  Past  Medical History  Past Medical History  Diagnosis Date  . ASTHMA 12/10/2009  . CEREBROVASCULAR ACCIDENT, HX OF 12/10/2009  . HYPERLIPIDEMIA 12/10/2009  . HYPERTENSION 12/10/2009  . Complication of anesthesia     "just can't get me up" (06/09/2013)  . Stroke [facility]) 2008    denies residual on 06/09/2013  . Tendonitis of wrist, right     Family History  family history is not on file.  Prior Rehab/Hospitalizations: No previous rehab admissions.  Has the patient had major surgery during 100 days prior to admission? No  Current Medications   Current facility-administered medications:  .  0.9 %  sodium chloride infusion, , Intravenous, Continuous, Sharl Ma, MD, Last Rate: 100 mL/hr at 06/13/15 0826 .  0.9 %  sodium chloride infusion, , Intravenous, Continuous, Donzetta Starch, NP, Last Rate: 125 mL/hr at 06/14/15 0958 .  acetaminophen (TYLENOL) tablet 650 mg, 650 mg, Oral, Q4H PRN **OR** acetaminophen (TYLENOL) suppository 650 mg, 650 mg, Rectal, Q4H PRN, Greta Doom, MD .  amLODipine (NORVASC) tablet 2.5 mg, 2.5 mg, Oral, Daily, Donzetta Starch, NP, 2.5 mg at 06/14/15 0947 .  clopidogrel (PLAVIX) tablet 75 mg, 75 mg, Oral, Daily, Donzetta Starch, NP, 75 mg at 06/14/15 0946 .  losartan (COZAAR) tablet 100 mg, 100 mg, Oral, Daily, 100 mg at 06/14/15 0946 **AND** hydrochlorothiazide (MICROZIDE) capsule 12.5 mg, 12.5 mg, Oral, Daily, Jake Church Masters, RPH, 12.5 mg at 06/14/15 0946 .  labetalol (NORMODYNE,TRANDATE) injection 10 mg, 10 mg, Intravenous, Q10 min PRN, Greta Doom, MD .  multivitamin with minerals tablet 1 tablet, 1 tablet, Oral, Daily, Donzetta Starch, NP, 1 tablet at 06/14/15 0947 .  pantoprazole sodium (PROTONIX) 40 mg/20 mL oral suspension 40 mg, 40 mg, Per Tube, Daily, Garvin Fila, MD, 40 mg at 06/13/15 1505 .  pravastatin (PRAVACHOL) tablet 20 mg, 20 mg, Oral, Daily, Donzetta Starch, NP, 20 mg at 06/14/15 0946 .  senna-docusate (Senokot-S) tablet 1 tablet, 1 tablet,  Oral, QHS PRN, Greta Doom, MD  Patients Current Diet: DIET DYS 2 Room service appropriate?: Yes; Fluid consistency:: Thin  Precautions / Restrictions Precautions Precautions: Fall Precaution Comments: left hemiparesis Restrictions Weight Bearing Restrictions: No   Has the patient had 2 or more falls or a fall with injury in the past year?No  Prior Activity Level Community (5-7x/wk): Went out 5 days a week to work PT at [employer].  Home Assistive Devices / Equipment    Prior Device Use: Indicate devices/aids used by the patient prior to current illness, exacerbation or injury? None  Prior Functional Level Prior Function Level of Independence: Independent Comments: Works as a Astronomer. No reported deficits from prior CVA reported.   Self Care: Did the patient need help bathing, dressing, using the toilet or eating?  Independent  Indoor Mobility: Did the patient need assistance with walking from room to room (with or without device)? Independent  Stairs: Did the patient need assistance with internal or external stairs (with or without device)? Independent  Functional Cognition: Did the patient need help planning regular tasks such as shopping or remembering to take medications? Independent  Current Functional Level Cognition  Overall Cognitive Status: Within Functional Limits for tasks assessed Orientation Level: Oriented X4    Extremity Assessment (includes Sensation/Coordination)  Upper Extremity Assessment: Defer to OT evaluation, LUE deficits/detail (Weak grip noted left hand. Able to grasp and extend digits purposefully.) LUE Deficits / Details: Able to initiate finger to thumb with increased time. LUE Sensation: decreased light touch LUE Coordination: decreased fine motor, decreased gross motor  Lower Extremity Assessment: LLE deficits/detail LLE Deficits / Details: Grossly ~1/5 knee extension, 1/5 knee flexion, 2/5 hip  flexion, 0/5 DF/PF. LLE Sensation: decreased light touch LLE Coordination: decreased gross motor, decreased fine motor    ADLs       Mobility  Overal bed mobility: Needs Assistance Bed Mobility: Supine to Sit, Sit to Supine, Rolling Rolling: Min assist Supine to sit: Mod assist, +2 for physical assistance, HOB elevated Sit to supine: Max assist, +2 for physical assistance General bed mobility comments: Sitting in chair upon PT arrival.     Transfers  Overall transfer level: Needs assistance Equipment used: None Transfers: Sit to/from Stand Sit to Stand: Mod assist, +2 physical assistance General transfer comment: Stood from chair x2 to assist with pericare. x3 during session. Cues for hand placement/foot placement and technique. Cues for anterior translation and weight shift. Therapist blocking left knee to prevent buckling. Cues and Mod A for controlled descent into chair.    Ambulation / Gait / Stairs / Office manager / Balance Dynamic Sitting Balance Sitting balance - Comments: Able to sit unsupported for short periods however fatigues quickly. Able to perform reciprocal scooting back in chair and to edge of chair with cues.  Balance Overall balance assessment: Needs assistance Sitting-balance support: Feet supported, Single extremity supported Sitting balance-Leahy Scale: Fair Sitting balance - Comments: Able to sit unsupported for short periods however fatigues quickly. Able to perform  reciprocal scooting back in chair and to edge of chair with cues.  Postural control: Posterior lean Standing balance support: During functional activity Standing balance-Leahy Scale: Zero Standing balance comment: Tolerated standing x3 for ~1 minute, ~1 minute, ~45 sec with weight shifting, focusing on upright posture. Therapist facilitating thoracic and lumbar extensors and hip extension for upright posture with WB through LUE on rail. Cues for slow descent into chair.  Requiring therapist blocking left knee to prevent buckling.      Special needs/care consideration BiPAP/CPAP No CPM No Continuous Drip IV 0.9% NS at 125 ml/hr Dialysis No       Life Vest No Oxygen No Special Bed No Trach Size No Wound Vac (area) No     Skin No                            Bowel mgmt: Patient reports no BM in the past 1 week Bladder mgmt: Voiding on bedpan with incontinence at times. Diabetic mgmt No    Previous Home Environment  Lives With: Family Available Help at Discharge: Family, Available 24 hours/day (Sister reports she can provide 24/7 S at discharge if needed.)  Discharge Living Setting Plans for Discharge Living Setting: House, Lives with (comment) (Plans home with sister and brother-in-law.) Type of Home at Discharge: House Discharge Home Layout: One level Discharge Home Access: Stairs to enter Entrance Stairs-Number of Steps: 3  Social/Family/Support Systems Patient Roles: Other (Comment) (Has 2 sisters, 4 brothers and other extended family.) Contact Information: Blaike Newburn - sister [phone] Anticipated Caregiver: sister and niece and other family. Ability/Limitations of Caregiver: sister, [name] works 12 hr shifts.  Sisters husband works 3rd shift. Caregiver Availability: Other (Comment) (Sister working on getting help for times when she is working) Discharge Plan Discussed with Primary Caregiver: Yes Is Caregiver In Agreement with Plan?: Yes Does Caregiver/Family have Issues with Lodging/Transportation while Pt is in Rehab?: No  Goals/Additional Needs Patient/Family Goal for Rehab: PT/OT supervision to min assist, ST supervision goals Expected length of stay: 18-21 days Cultural Considerations: [employer] Dietary Needs: Dys 2, thin liquids Equipment Needs: TBD Additional Information: Lived with her mom and was caregiver for her mom who has cancer.  Brother now helping her mom. Pt/Family Agrees to Admission and willing to participate:  Yes Program Orientation Provided & Reviewed with Pt/Caregiver Including Roles  & Responsibilities: Yes  Decrease burden of Care through IP rehab admission: N/A  Possible need for SNF placement upon discharge: Yes, but likely family will be able to assist at discharge.  Patient Condition: This patient's condition remains as documented in the consult dated 06/13/15, in which the Rehabilitation Physician determined and documented that the patient's condition is appropriate for intensive rehabilitative care in an inpatient rehabilitation facility. Will admit to inpatient rehab today.  Preadmission Screen Completed By:  Retta Diones, 06/14/2015 2:29 PM ______________________________________________________________________   Discussed status with Dr. Letta Pate on 06/14/15 at 1429 and received telephone approval for admission today.  Admission Coordinator:  Retta Diones, time1429/Date11/03/16

## 2015-06-14 NOTE — H&P (Signed)
Physical Medicine and Rehabilitation Admission H&P   Chief Complaint  Patient presents with  . Code Stroke  : HPI: Rebecca Yoder is a 49 y.o. right handed female with history of hypertension, previous right brain subcortical infarct October 2014 as well as remote right frontal insular infarct 2008 and MCA stenosis with known PFO maintained on aspirin 325 mg daily. Patient lives with her sister and mother who has cancer and undergoing treatment at this time. Independent prior to admission and driving. One level house with steps to entry Presented 06/11/2015 with sudden onset of left-sided weakness, dysarthria and right gaze deviation. Cranial CT scan negative for acute changes. Patient did receive TPA. CTA of head and neck shows occluded M1 segment of the right middle cerebral artery that appeared to have progressed since prior MR angiogram. Narrowing proximal right internal carotid artery with less than 50% diameter stenosis. MRI 06/13/2015 shows progressive nonhemorrhagic infarct involving the right basal ganglia. Remote anterior right MCA territory infarct involving the insular cortex.. Echocardiogram with ejection fraction of 70% no wall motion abnormalities. Venous Dopplers lower extremities negative for DVT. Neurology consulted with workup and maintained on Plavix therapy for CVA prophylaxis. Dysphagia #2 thin liquid diet. Physical therapy evaluation completed with recommendations of physical medicine rehabilitation consult. Patient was admitted for comprehensive rehabilitation program  ROS Constitutional: Negative for fever and chills.  HENT: Negative for hearing loss.  Eyes: Negative for blurred vision and double vision.  Respiratory: Negative for cough and shortness of breath.  Cardiovascular: Negative for chest pain, palpitations and leg swelling.  Gastrointestinal: Positive for constipation. Negative for nausea, vomiting and abdominal pain.  Genitourinary: Negative for  dysuria and flank pain.  Musculoskeletal: Positive for myalgias.  Skin: Negative for rash.  Neurological: Positive for weakness. Negative for seizures, loss of consciousness and headaches.  All other systems reviewed and are negative   Past Medical History  Diagnosis Date  . ASTHMA 12/10/2009  . CEREBROVASCULAR ACCIDENT, HX OF 12/10/2009  . HYPERLIPIDEMIA 12/10/2009  . HYPERTENSION 12/10/2009  . Complication of anesthesia     "just can't get me up" (06/09/2013)  . Stroke [facility]) 2008    denies residual on 06/09/2013  . Tendonitis of wrist, right    Past Surgical History  Procedure Laterality Date  . Ovarian cyst removal  1990's   History reviewed. No pertinent family history. Social History:  reports that she has been smoking Cigarettes. She has been smoking about 0.50 packs per day. She has never used smokeless tobacco. She reports that she drinks alcohol. She reports that she does not use illicit drugs. Allergies:  Allergies  Allergen Reactions  . Penicillins Rash   Medications Prior to Admission  Medication Sig Dispense Refill  . amLODipine (NORVASC) 2.5 MG tablet TAKE ONE TABLET BY MOUTH ONCE DAILY 30 tablet 2  . aspirin 325 MG tablet Take 325 mg by mouth daily.    . Ibuprofen-Diphenhydramine Cit (ADVIL PM) 200-38 MG TABS Take 2 tablets by mouth at bedtime.    Marland Kitchen losartan-hydrochlorothiazide (HYZAAR) 100-12.5 MG per tablet TAKE ONE TABLET BY MOUTH ONCE DAILY 90 tablet 2  . Multiple Vitamin (MULTIVITAMIN WITH MINERALS) TABS tablet Take 1 tablet by mouth daily.    . pravastatin (PRAVACHOL) 20 MG tablet TAKE ONE TABLET BY MOUTH ONCE DAILY 30 tablet 2  . acetaminophen (TYLENOL) 325 MG tablet Take 2 tablets (650 mg total) by mouth every 6 (six) hours as needed. 20 tablet 0  . albuterol (PROAIR HFA) 108 (90 BASE) MCG/ACT inhaler  Inhale 2 puffs into the lungs every 6 (six) hours as needed for  wheezing. 9 g 5  . predniSONE (DELTASONE) 20 MG tablet Take 2 tablets (40 mg total) by mouth daily with breakfast. (Patient not taking: Reported on 06/11/2015) 8 tablet 0  . [DISCONTINUED] clopidogrel (PLAVIX) 75 MG tablet TAKE ONE TABLET BY MOUTH ONCE DAILY (Patient not taking: Reported on 06/11/2015) 90 tablet 1    Home: [place] expects to be discharged to:: Inpatient rehab Available Help at Discharge: Family, Available 24 hours/day (Sister reports she can provide 24/7 S at discharge if needed.) Lives With: Family  Functional History: Prior Function Level of Independence: Independent Comments: Works as a Astronomer. No reported deficits from prior CVA reported.   Functional Status:  Mobility: Bed Mobility Overal bed mobility: Needs Assistance Bed Mobility: Supine to Sit, Sit to Supine, Rolling Rolling: Min assist Supine to sit: Mod assist, +2 for physical assistance, HOB elevated Sit to supine: Max assist, +2 for physical assistance General bed mobility comments: Assist to bring LLE to EOB, elevate trunk and scoot bottom to EOB. Assist to bring BLEs into bed and lower trunk. Rolling to R/L x4 to change pad and donn panties. Transfers Overall transfer level: (Deferred per RN to await doppler and MRI results.)      ADL:    Cognition: Cognition Overall Cognitive Status: Within Functional Limits for tasks assessed Orientation Level: Oriented X4 Cognition Arousal/Alertness: Awake/alert Behavior During Therapy: WFL for tasks assessed/performed (Emotionally labile- "tears of joy.") Overall Cognitive Status: Within Functional Limits for tasks assessed  Physical Exam: Blood pressure 145/84, pulse 82, temperature 98.6 F (37 C), temperature source Oral, resp. rate 22, height 5\' 10"  (1.778 m), weight 83 kg (182 lb 15.7 oz), last menstrual period 05/14/2015, SpO2 100 %. Physical Exam HENT:  Right gaze preference  Eyes:  Pupil reactive to  light  Neck: Normal range of motion. Neck supple. No thyromegaly present.  Cardiovascular: Normal rate and regular rhythm.  Respiratory: Effort normal and breath sounds normal. No respiratory distress.  GI: Soft. Bowel sounds are normal. She exhibits no distension.  Neurological: She is alert.  Dysarthric speech but intelligible. Emotional lability. Oriented to person and place as well as date of birth. Fair awareness of deficits. Follows simple commands  Skin: Skin is warm and dry.  Visual fields difficult to assess due to poor cooperation with confrontation testing Tactile neglect improved on the left side in the left upper and lower limb Motor strength is 2 minus in the left deltoid, biceps, triceps, grip, finger flexors, finger extensors 3 minus/5 strength in the left hip flexor and knee extensor to minus and trace ankle dorsiflexor and plantar flexor 4/5 in the right deltoid, biceps, triceps, grip, hip flexor, knee extensor, ankle dorsi flexors and plantar flexor Sensory is able identify light touch in the left hand and left foot however she notes that the sensation does not feel same as on the right side    Lab Results Last 48 Hours    Results for orders placed or performed during the hospital encounter of 06/11/15 (from the past 48 hour(s))  MRSA PCR Screening Status: None   Collection Time: 06/11/15 9:25 PM  Result Value Ref Range   MRSA by PCR NEGATIVE NEGATIVE    Comment:   The GeneXpert MRSA Assay (FDA approved for NASAL specimens only), is one component of a comprehensive MRSA colonization surveillance program. It is not intended to diagnose MRSA infection nor to guide or monitor treatment  for MRSA infections.   Glucose, capillary Status: Abnormal   Collection Time: 06/11/15 9:29 PM  Result Value Ref Range   Glucose-Capillary 111 (H) 65 - 99 mg/dL  Hemoglobin A1c Status: Abnormal   Collection Time: 06/12/15  5:51 AM  Result Value Ref Range   Hgb A1c MFr Bld 6.6 (H) 4.8 - 5.6 %    Comment: (NOTE)  Pre-diabetes: 5.7 - 6.4  Diabetes: >6.4  Glycemic control for adults with diabetes: <7.0    Mean Plasma Glucose 143 mg/dL    Comment: (NOTE) Performed At: [facility] [place], [place] [id number] Lindon Romp MD DZ:[id number]   Lipid panel Status: Abnormal   Collection Time: 06/12/15 5:59 AM  Result Value Ref Range   Cholesterol 153 0 - 200 mg/dL   Triglycerides 139 <150 mg/dL   HDL 39 (L) >40 mg/dL   Total CHOL/HDL Ratio 3.9 RATIO   VLDL 28 0 - 40 mg/dL   LDL Cholesterol 86 0 - 99 mg/dL    Comment:   Total Cholesterol/HDL:CHD Risk Coronary Heart Disease Risk Table  Men Women 1/2 Average Risk 3.4 3.3 Average Risk 5.0 4.4 2 X Average Risk 9.6 7.1 3 X Average Risk 23.4 11.0   Use the calculated Patient Ratio above and the CHD Risk Table to determine the patient's CHD Risk.   ATP III CLASSIFICATION (LDL): <100 mg/dL Optimal 100-129 mg/dL Near or Above  Optimal 130-159 mg/dL Borderline 160-189 mg/dL High >190 mg/dL Very High       Imaging Results (Last 48 hours)    Ct Angio Head W/cm &/or Wo Cm  06/11/2015 CLINICAL DATA: 49 year old hypertensive female with hyperlipidemia and history of prior stroke presenting with sudden onset of left-sided paralysis and slurred speech. Subsequent encounter. EXAM: CT ANGIOGRAPHY HEAD AND NECK TECHNIQUE: Multidetector CT imaging of the head and neck was performed using the standard protocol during bolus administration of intravenous contrast. Multiplanar CT image reconstructions and MIPs were obtained to evaluate the vascular anatomy. Carotid stenosis measurements (when applicable) are obtained utilizing NASCET criteria, using the distal  internal carotid diameter as the denominator. CONTRAST: 74mL OMNIPAQUE IOHEXOL 350 MG/ML SOLN COMPARISON: 06/11/2015 and 06/09/2013 unenhanced head CT. 06/09/2013 brain MR and MR angiogram. FINDINGS: CT HEAD Brain: Remote right frontal lobe infarct with encephalomalacia and subsequent dilation of the right lateral ventricle. Question acute extension of infarct along the superior margin. No intracranial hemorrhage. No intracranial mass or abnormal enhancement. Calvarium and skull base: Negative. Paranasal sinuses: Clear. Orbits: Negative. Prominent cerumen right ear. CTA NECK Aortic arch: 2 vessel aortic arch with mild ectasia. Pulsation artifact limits evaluation of the ascending thoracic aorta. Right carotid system: Web-like narrowing proximal right internal carotid artery with less than 50% diameter stenosis. Left carotid system: Ectatic left common carotid artery. The distal left common carotid artery extends posterior and medial to the hyoid with hyoid compressing the anterior lateral aspect of the distal left common carotid artery with subsequent mild to slight moderate narrowing. No significant stenosis of the left carotid bifurcation Vertebral arteries:Mild narrowing distal left subclavian artery. Left vertebral artery is dominant. Slight narrowing proximal left vertebral artery. Mild irregularity of the vertical segment of vertebral arteries bilaterally without high-grade stenosis. Skeleton: Mild cervical spondylotic changes most prominent C5-6. Other neck: No worrisome primary neck mass. Scattered normal to top normal size lymph nodes more notable on the right. No worrisome lung apical lesion. There may be a coronary artery calcifications. CTA HEAD Anterior circulation: Moderate narrowing right internal carotid  artery cavernous segment. Fetal type contribution to the right posterior cerebral artery. Occluded M1 segment of the right middle cerebral artery. This appears to have progressed since the prior MR  angiogram (which was significantly motion degraded). Decrease number of visualized right middle cerebral artery branch vessels consistent with remote infarct and possibly acute infarct. Hypoplastic A1 segment right anterior cerebral artery. Mild irregularity and narrowing left internal carotid artery cavernous segment. Mild irregularity and narrowing left middle cerebral artery M1 segment. Posterior circulation: Left vertebral artery is dominant. Slight irregularity distal vertebral arteries. Narrowed irregular basilar artery. Venous sinuses: Patent. Probable artifact within the non dominant left jugular bulb and left internal jugular vein. Anatomic variants: As above. Delayed phase: As above IMPRESSION: CT HEAD Remote right frontal lobe infarct with encephalomalacia and subsequent dilation of the right lateral ventricle. Question acute extension of infarct along the superior margin. CTA HEAD Moderate narrowing right internal carotid artery cavernous segment. Occluded M1 segment of the right middle cerebral artery. This appears to have progressed since the prior MR angiogram (which was significantly motion degraded). Decrease number of visualized right middle cerebral artery branch vessels consistent with remote infarct and possibly acute infarct. Hypoplastic A1 segment right anterior cerebral artery. Mild irregularity and narrowing left internal carotid artery cavernous segment. Mild irregularity and narrowing left middle cerebral artery M1 segment. Left vertebral artery is dominant. Slight irregularity distal vertebral arteries. Narrowed irregular basilar artery. CTA NECK Web-like narrowing proximal right internal carotid artery with less than 50% diameter stenosis. Ectatic distal left common carotid artery extends posterior and medial to the hyoid with hyoid compressing the anterior lateral aspect of the distal left common carotid artery with subsequent mild to slight moderate narrowing. No significant stenosis  of the left carotid bifurcation. Left vertebral artery is dominant. Slight narrowing proximal left vertebral artery. Mild irregularity of the vertical segment of vertebral arteries bilaterally without high-grade stenosis. Scattered normal to top normal size lymph nodes more notable on the right. There may be coronary artery calcifications. Electronically Signed By: Genia Del M.D. On: 06/11/2015 15:39   Ct Head Wo Contrast  06/12/2015 CLINICAL DATA: 49 year old female code stroke patient with slurred speech and left side weakness. Frontal headache. Chronic right MCA vessel disease in infarct. Initial encounter. EXAM: CT HEAD WITHOUT CONTRAST TECHNIQUE: Contiguous axial images were obtained from the base of the skull through the vertex without intravenous contrast. COMPARISON: CTA head and neck 06/11/2015, and earlier. FINDINGS: Visualized paranasal sinuses and mastoids are clear. No acute osseous abnormality identified. Rightward gaze deviation. No acute scalp or orbits soft tissue finding. Chronic right insula and operculum encephalomalacia. This appears stable since 2014. No superimposed acute right hemisphere cortically based infarct is identified. No acute intracranial hemorrhage identified. No midline shift, mass effect, or evidence of intracranial mass lesion. Stable ventricle size and configuration. Stable and normal left hemisphere gray-white matter differentiation. IMPRESSION: Stable non contrast CT appearance of the brain, chronic right MCA infarct. No acute cortically based infarct or intracranial hemorrhage identified. Electronically Signed By: Genevie Ann M.D. On: 06/12/2015 13:38   Ct Angio Neck W/cm &/or Wo/cm  06/11/2015 CLINICAL DATA: 49 year old hypertensive female with hyperlipidemia and history of prior stroke presenting with sudden onset of left-sided paralysis and slurred speech. Subsequent encounter. EXAM: CT ANGIOGRAPHY HEAD AND NECK TECHNIQUE: Multidetector CT imaging of  the head and neck was performed using the standard protocol during bolus administration of intravenous contrast. Multiplanar CT image reconstructions and MIPs were obtained to evaluate the vascular anatomy. Carotid stenosis measurements (  when applicable) are obtained utilizing NASCET criteria, using the distal internal carotid diameter as the denominator. CONTRAST: 15mL OMNIPAQUE IOHEXOL 350 MG/ML SOLN COMPARISON: 06/11/2015 and 06/09/2013 unenhanced head CT. 06/09/2013 brain MR and MR angiogram. FINDINGS: CT HEAD Brain: Remote right frontal lobe infarct with encephalomalacia and subsequent dilation of the right lateral ventricle. Question acute extension of infarct along the superior margin. No intracranial hemorrhage. No intracranial mass or abnormal enhancement. Calvarium and skull base: Negative. Paranasal sinuses: Clear. Orbits: Negative. Prominent cerumen right ear. CTA NECK Aortic arch: 2 vessel aortic arch with mild ectasia. Pulsation artifact limits evaluation of the ascending thoracic aorta. Right carotid system: Web-like narrowing proximal right internal carotid artery with less than 50% diameter stenosis. Left carotid system: Ectatic left common carotid artery. The distal left common carotid artery extends posterior and medial to the hyoid with hyoid compressing the anterior lateral aspect of the distal left common carotid artery with subsequent mild to slight moderate narrowing. No significant stenosis of the left carotid bifurcation Vertebral arteries:Mild narrowing distal left subclavian artery. Left vertebral artery is dominant. Slight narrowing proximal left vertebral artery. Mild irregularity of the vertical segment of vertebral arteries bilaterally without high-grade stenosis. Skeleton: Mild cervical spondylotic changes most prominent C5-6. Other neck: No worrisome primary neck mass. Scattered normal to top normal size lymph nodes more notable on the right. No worrisome lung apical lesion. There  may be a coronary artery calcifications. CTA HEAD Anterior circulation: Moderate narrowing right internal carotid artery cavernous segment. Fetal type contribution to the right posterior cerebral artery. Occluded M1 segment of the right middle cerebral artery. This appears to have progressed since the prior MR angiogram (which was significantly motion degraded). Decrease number of visualized right middle cerebral artery branch vessels consistent with remote infarct and possibly acute infarct. Hypoplastic A1 segment right anterior cerebral artery. Mild irregularity and narrowing left internal carotid artery cavernous segment. Mild irregularity and narrowing left middle cerebral artery M1 segment. Posterior circulation: Left vertebral artery is dominant. Slight irregularity distal vertebral arteries. Narrowed irregular basilar artery. Venous sinuses: Patent. Probable artifact within the non dominant left jugular bulb and left internal jugular vein. Anatomic variants: As above. Delayed phase: As above IMPRESSION: CT HEAD Remote right frontal lobe infarct with encephalomalacia and subsequent dilation of the right lateral ventricle. Question acute extension of infarct along the superior margin. CTA HEAD Moderate narrowing right internal carotid artery cavernous segment. Occluded M1 segment of the right middle cerebral artery. This appears to have progressed since the prior MR angiogram (which was significantly motion degraded). Decrease number of visualized right middle cerebral artery branch vessels consistent with remote infarct and possibly acute infarct. Hypoplastic A1 segment right anterior cerebral artery. Mild irregularity and narrowing left internal carotid artery cavernous segment. Mild irregularity and narrowing left middle cerebral artery M1 segment. Left vertebral artery is dominant. Slight irregularity distal vertebral arteries. Narrowed irregular basilar artery. CTA NECK Web-like narrowing proximal right  internal carotid artery with less than 50% diameter stenosis. Ectatic distal left common carotid artery extends posterior and medial to the hyoid with hyoid compressing the anterior lateral aspect of the distal left common carotid artery with subsequent mild to slight moderate narrowing. No significant stenosis of the left carotid bifurcation. Left vertebral artery is dominant. Slight narrowing proximal left vertebral artery. Mild irregularity of the vertical segment of vertebral arteries bilaterally without high-grade stenosis. Scattered normal to top normal size lymph nodes more notable on the right. There may be coronary artery calcifications. Electronically Signed By:  Genia Del M.D. On: 06/11/2015 15:39        Medical Problem List and Plan: 1. Functional deficits secondary to right MCA infarct with history of prior CVA 2008 as well as 2014 2. DVT Prophylaxis/Anticoagulation: SCDs. Venous Doppler is negative 3. Pain Management: Tylenol as needed 4. Dysphagia. Dysphagia #2 thin liquid diet. Monitor for any signs of aspiration. Follow-up speech therapy 5. Neuropsych: This patient is capable of making decisions on her own behalf. 6. Skin/Wound Care: Routine skin checks 7. Fluids/Electrolytes/Nutrition: Routine high nose with follow-up chemistries 8. Hyperlipidemia. Continue Pravachol 9. Hypertension. Norvasc 2.5 mg daily, Cozaar 100 mg daily, HCTZ 12.5 mg daily. Monitor with increased mobility 10. History of asthma. Patient on albuterol inhaler as needed prior to admission   Post Admission Physician Evaluation: 1. Functional deficits secondary to Acute RightMCA infarct with left hemiparesis. 2. Patient is admitted to receive collaborative, interdisciplinary care between the physiatrist, rehab nursing staff, and therapy team. 3. Patient's level of medical complexity and substantial therapy needs in context of that medical necessity cannot be provided at a lesser intensity of care such  as a SNF. 4. Patient has experienced substantial functional loss from his/her baseline which was documented above under the "Functional History" and "Functional Status" headings. Judging by the patient's diagnosis, physical exam, and functional history, the patient has potential for functional progress which will result in measurable gains while on inpatient rehab. These gains will be of substantial and practical use upon discharge in facilitating mobility and self-care at the household level. 5. Physiatrist will provide 24 hour management of medical needs as well as oversight of the therapy plan/treatment and provide guidance as appropriate regarding the interaction of the two. 6. 24 hour rehab nursing will assist with bladder management, bowel management, safety, skin/wound care, disease management, medication administration, pain management and patient education and help integrate therapy concepts, techniques,education, etc. 7. PT will assess and treat for/with: pre gait, gait training, endurance , safety, equipment, neuromuscular re education. Goals are: Min assist. 8. OT will assess and treat for/with: ADLs, Cognitive perceptual skills, Neuromuscular re education, safety, endurance, equipment. Goals are: Supervision to min assist. Therapy May proceed with showering this patient. 9. SLP will assess and treat for/with: Memory, attention, problem solving, concentration, left-sided awareness. Goals are: Supervision medication management. 10. Case Management and Social Worker will assess and treat for psychological issues and discharge planning. 11. Team conference will be held weekly to assess progress toward goals and to determine barriers to discharge. 12. Patient will receive at least 3 hours of therapy per day at least 5 days per week. 13. ELOS: 18-21 days  14. Prognosis: excellent     [name] M.D. [facility] FAAPM&R (Sports Med, Neuromuscular  Med) Diplomate Am Board of Electrodiagnostic Med  06/13/2015

## 2015-06-14 NOTE — Progress Notes (Signed)
Speech Language Pathology Treatment: Dysphagia;Cognitive-Linquistic  Patient Details Name: Rebecca Yoder MRN: [id number] DOB: [birth year] Today's Date: 06/14/2015 Time: 2836-6294 SLP Time Calculation (min) (ACUTE ONLY): 30 min  Assessment / Plan / Recommendation Clinical Impression  Pt was seen for skilled ST targeting goals for dysphagia and communication.  Pt was observed with presentations of her currently prescribed diet for ongoing assessment of toleration and readiness to advance.  Pt consumed dys 2 textures and thin liquids with min assist verbal cues to monitor and correct left sided buccal residue.  Pt demonstrated immediate cough following x1 large bolus of thins via straw, smaller sips were tolerated well without overt s/s of aspiration.  Pt subjectively reports noticing improvements in her speech and was ~75% intelligible in conversations due to imprecise articulation of consonants.  SLP reviewed and reinforced pacing and overarticulation to facilitate improved intelligibility when speaking with familiar and unfamiliar communication partners.  Pt was noted to utilize compensatory strategies with min assist verbal cues from SLP during loosely structured conversations.     HPI Other Pertinent Information: Rebecca Yoder is a 49 y.o. female with a history of multiple previous strokes and MCA stenosis, who presents with sudden onset left sided weakness and right gaze deviation. CT Head without acute findings, MRI pending. Pt s/p tPA.   Pertinent Vitals Pain Assessment: No/denies pain Faces Pain Scale: No hurt  SLP Plan  Continue with current plan of care    Recommendations Diet recommendations: Dysphagia 2 (fine chop);Thin liquid Liquids provided via: Straw Medication Administration: Crushed with puree Supervision: Patient able to self feed;Intermittent supervision to cue for compensatory strategies Compensations: Slow rate;Small sips/bites;Check for pocketing Postural Changes  and/or Swallow Maneuvers: Seated upright 90 degrees              Oral Care Recommendations: Oral care BID Follow up Recommendations: Inpatient Rehab Plan: Continue with current plan of care    GO     Joshua Soulier, Selinda Orion 06/14/2015, 1:50 PM

## 2015-06-14 NOTE — Progress Notes (Signed)
Physical Therapy Treatment Patient Details Name: Rebecca Yoder MRN: [id number] DOB: Mar 15, 1966 Today's Date: 06/14/2015    History of Present Illness Patient is a 49 y/o female presents with left sided weakness and dysarthria. CTA - occluded M1. CT head-Remote right frontal lobe infarct with encephalomalacia and subsequent dilation of the right lateral ventricle. Question acute extension of infarct along the superior margin. s/p tPA. MRI-Progressive nonhemorrhagic infarct involving the right basal ganglia. PMH includes prior CVA, HTN, HLD.    PT Comments    Patient progressing well towards PT goals. Today's session focused on standing balance and facilitation of postural alignment/activation. Tolerated weight shifting with assist of 2 for safety with therapist providing support to left knee to prevent buckling. Education on positioning of LUE. Able to initiate facilitation of trunk musculature with Mod A and maintain for a short period but fatigues. Highly motivated to participate in therapy. Great candidate for CIR. Will continue to follow acutely.   Follow Up Recommendations  CIR     Equipment Recommendations  Other (comment) (TBD)    Recommendations for Other Services       Precautions / Restrictions Precautions Precautions: Fall Precaution Comments: left hemiparesis Restrictions Weight Bearing Restrictions: No    Mobility  Bed Mobility               General bed mobility comments: Sitting in chair upon PT arrival.   Transfers Overall transfer level: Needs assistance Equipment used: None Transfers: Sit to/from Stand Sit to Stand: Mod assist;+2 physical assistance         General transfer comment: Stood from chair x2 to assist with pericare. x3 during session. Cues for hand placement/foot placement and technique. Cues for anterior translation and weight shift. Therapist blocking left knee to prevent buckling. Cues and Mod A for controlled descent into  chair.  Ambulation/Gait                 Stairs            Wheelchair Mobility    Modified Rankin (Stroke Patients Only) Modified Rankin (Stroke Patients Only) Pre-Morbid Rankin Score: No symptoms Modified Rankin: Severe disability     Balance Overall balance assessment: Needs assistance Sitting-balance support: Feet supported;Single extremity supported Sitting balance-Leahy Scale: Fair Sitting balance - Comments: Able to sit unsupported for short periods however fatigues quickly. Able to perform reciprocal scooting back in chair and to edge of chair with cues.    Standing balance support: During functional activity Standing balance-Leahy Scale: Zero Standing balance comment: Tolerated standing x3 for ~1 minute, ~1 minute, ~45 sec with weight shifting, focusing on upright posture. Therapist facilitating thoracic and lumbar extensors and hip extension for upright posture with WB through LUE on rail. Cues for slow descent into chair. Requiring therapist blocking left knee to prevent buckling.                      Cognition Arousal/Alertness: Awake/alert Behavior During Therapy: WFL for tasks assessed/performed;Impulsive Overall Cognitive Status: Within Functional Limits for tasks assessed                      Exercises      General Comments General comments (skin integrity, edema, etc.): Sister present in room. Discussed importance of positioning for LUE to prevent subluxation and using Left side of body as much as tolerated.      Pertinent Vitals/Pain Pain Assessment: No/denies pain Faces Pain Scale: No hurt    Home  Living                      Prior Function            PT Goals (current goals can now be found in the care plan section) Progress towards PT goals: Progressing toward goals    Frequency  Min 4X/week    PT Plan Current plan remains appropriate    Co-evaluation             End of Session Equipment Utilized  During Treatment: Gait belt Activity Tolerance: Patient tolerated treatment well Patient left: in chair;with call bell/phone within reach;with chair alarm set;with family/visitor present     Time: 0762-2633 PT Time Calculation (min) (ACUTE ONLY): 31 min  Charges:  $Therapeutic Activity: 8-22 mins $Neuromuscular Re-education: 8-22 mins                    G Codes:      Cordarro Spinnato A Karell Tukes 06/14/2015, 11:45 AM Wray Kearns, PT, DPT [phone]

## 2015-06-14 NOTE — Progress Notes (Addendum)
Rehab admissions - I met with patient.  She tells me that she does not think she has insurance.  However, face sheet says patient has PHCS.  I will call insurance carrier now and check insurance.  I will update all shortly.  We do have beds available today.  Call me for questions.  #[phone]  Patient has no insurance.  She would like to admit to acute inpatient rehab.  Bed available and will admit to inpatient rehab today.  Call me for questions.  #[phone]

## 2015-06-14 NOTE — Progress Notes (Signed)
Occupational Therapy Evaluation Patient Details Name: Rebecca Yoder MRN: [id number] DOB: 1966-01-04 Today's Date: 06/14/2015    History of Present Illness Patient is a 49 y/o female presents with left sided weakness and dysarthria. CTA - occluded M1. CT head-Remote right frontal lobe infarct with encephalomalacia and subsequent dilation of the right lateral ventricle. Question acute extension of infarct along the superior margin. s/p tPA. MRI-Progressive nonhemorrhagic infarct involving the right basal ganglia. PMH includes prior CVA, HTN, HLD.   Clinical Impression   Pt admitted with the above diagnoses and presents with below problem list. Pt will benefit from continued acute OT to address the below listed deficits and maximize independence with BADLs prior to d/c to venue below. PTA pt was independent with ADLs, drove, and works in [employer]. Pt is currently +2 for functional transfers. LUE weakness impacting level of assist with UB ADLs as well. Session details below. Pt is motivated and will be a great candidate for CIR. OT to continue to follow acutel.      Follow Up Recommendations  CIR    Equipment Recommendations  Other (comment) (TBD next venue)    Recommendations for Other Services       Precautions / Restrictions Precautions Precautions: Fall Precaution Comments: left hemiparesis Restrictions Weight Bearing Restrictions: No      Mobility Bed Mobility               General bed mobility comments: in recliner  Transfers         ADL Overall ADL's : Needs assistance/impaired Eating/Feeding: Sitting;Minimal assistance   Grooming: Maximal assistance;Sitting   Upper Body Bathing: Maximal assistance;Sitting   Lower Body Bathing: +2 for physical assistance;Sit to/from stand   Upper Body Dressing : Maximal assistance;Sitting   Lower Body Dressing: +2 for physical assistance;Sit to/from stand   Toilet Transfer: +2 for physical assistance    Toileting- Clothing Manipulation and Hygiene: +2 for physical assistance         General ADL Comments: Pt assessed in recliner. Did not attempt transfers as pt is +2 and did not have +2 assist on eval.      Vision Vision Assessment?: No apparent visual deficits Additional Comments: Able to read therapist name badge and writing on wall with no reported difficulty. Denies blurriness or diplopia.   Perception     Praxis      Pertinent Vitals/Pain Pain Assessment: No/denies pain Faces Pain Scale: No hurt     Hand Dominance Right   Extremity/Trunk Assessment Upper Extremity Assessment Upper Extremity Assessment: LUE deficits/detail LUE Deficits / Details: able to move digits on left hand, trace muscle activation with wrist extension/flexion and forearm supination/pronation. Assessed shoulder and elbow level with pt sitting up in chair. Pt able to shrug shoulders minimally on left side. No AROM noted at shoulder or elbow level but did not assess in supine. Weak grip strength. Possible associated reaction noted with RUE. LUE Sensation: decreased light touch LUE Coordination: decreased fine motor;decreased gross motor   Lower Extremity Assessment Lower Extremity Assessment: Defer to PT evaluation       Communication Communication Communication: Expressive difficulties (Dysarthria noted)   Cognition Arousal/Alertness: Awake/alert Behavior During Therapy: WFL for tasks assessed/performed;Impulsive Overall Cognitive Status: Within Functional Limits for tasks assessed                     General Comments       Exercises       Shoulder Instructions  Home Living Family/patient expects to be discharged to:: Inpatient rehab   Available Help at Discharge: Family;Available 24 hours/day                                Lives With: Family    Prior Functioning/Environment Level of Independence: Independent        Comments: drives, 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    OT Diagnosis: Hemiplegia non-dominant side   OT Problem List: Decreased strength;Decreased range of motion;Decreased activity tolerance;Impaired balance (sitting and/or standing);Decreased coordination;Decreased knowledge of use of DME or AE;Decreased knowledge of precautions;Impaired tone;Impaired sensation;Impaired UE functional use   OT Treatment/Interventions: Self-care/ADL training;Therapeutic exercise;Neuromuscular education;DME and/or AE instruction;Therapeutic activities;Patient/family education;Balance training    OT Goals(Current goals can be found in the care plan section) Acute Rehab OT Goals Patient Stated Goal: to go to rehab and return to independence OT Goal Formulation: With patient Time For Goal Achievement: 06/28/15 Potential to Achieve Goals: Good ADL Goals Pt Will Perform Eating: with adaptive utensils;with set-up;sitting Pt Will Perform Grooming: with set-up;with adaptive equipment;sitting Pt Will Perform Upper Body Bathing: sitting;with set-up Pt Will Perform Lower Body Bathing: with max assist;sit to/from stand;sitting/lateral leans;with adaptive equipment Pt Will Perform Upper Body Dressing: with set-up;sitting;with adaptive equipment Pt Will Perform Lower Body Dressing: with max assist;sitting/lateral leans;sit to/from stand Pt Will Transfer to Toilet: with max assist;bedside commode;ambulating Pt Will Perform Toileting - Clothing Manipulation and hygiene: sitting/lateral leans;sit to/from stand;with mod assist Pt Will Perform Tub/Shower Transfer: with max assist;ambulating;3 in 1 Pt/caregiver will Perform Home Exercise Program: Increased ROM;Left upper extremity;With written HEP provided  OT Frequency: Min 3X/week   Barriers to D/C:            Co-evaluation              End of Session    Activity Tolerance: Patient tolerated treatment well Patient left: in chair;with call bell/phone within reach   Time: 1436-1450 OT  Time Calculation (min): 14 min Charges:  OT General Charges $OT Visit: 1 Procedure OT Evaluation $Initial OT Evaluation Tier I: 1 Procedure G-Codes:    Hortencia Pilar 2015/06/24, 3:04 PM

## 2015-06-14 NOTE — Discharge Summary (Signed)
Stroke Discharge Summary  Patient ID: avaleen brownley   MRN: [id number]      DOB: 12-Jul-1966  Date of Admission: 06/11/2015 Date of Discharge: 06/14/2015  Attending Physician:  Garvin Fila, MD, Stroke MD  Consulting Physician(s):  Alysia Penna, MD (Physical Medicine & Rehabtilitation)   Patient's PCP:  Nyoka Cowden, MD  Discharge Diagnoses:  Principal Problem:   Stroke (cerebrum) ([facility]) - Non-dominant right basal ganglia infarct s/p IV tPA, embolic, unknown source Active Problems:   Hyperlipidemia   Essential hypertension   PFO (patent foramen ovale)   Acute left hemiparesis ([facility])   Hemi-neglect of left side   Hyperglycemia   Hx stroke   Decreased UOP   Past Medical History  Diagnosis Date  . ASTHMA 12/10/2009  . CEREBROVASCULAR ACCIDENT, HX OF 12/10/2009  . HYPERLIPIDEMIA 12/10/2009  . HYPERTENSION 12/10/2009  . Complication of anesthesia     "just can't get me up" (06/09/2013)  . Stroke [facility]) 2008    denies residual on 06/09/2013  . Tendonitis of wrist, right    Past Surgical History  Procedure Laterality Date  . Ovarian cyst removal  1990's    Medications to be continued on Rehab . amLODipine  2.5 mg Oral Daily  . clopidogrel  75 mg Oral Daily  . losartan  100 mg Oral Daily   And  . hydrochlorothiazide  12.5 mg Oral Daily  . multivitamin with minerals  1 tablet Oral Daily  . pantoprazole sodium  40 mg Per Tube Daily  . pravastatin  20 mg Oral Daily    LABORATORY STUDIES CBC    Component Value Date/Time   WBC 7.0 06/11/2015 1345   RBC 4.00 06/11/2015 1345   HGB 12.1 06/11/2015 1345   HGB 13.3 06/11/2015 1345   HCT 35.4* 06/11/2015 1345   HCT 39.0 06/11/2015 1345   PLT 395 06/11/2015 1345   MCV 88.5 06/11/2015 1345   MCH 30.3 06/11/2015 1345   MCHC 34.2 06/11/2015 1345   RDW 14.4 06/11/2015 1345   LYMPHSABS 4.2* 06/11/2015 1345   MONOABS 0.7 06/11/2015 1345   EOSABS 0.5 06/11/2015 1345   BASOSABS 0.0 06/11/2015 1345   CMP     Component Value Date/Time   NA 137 06/11/2015 1345   NA 137 06/11/2015 1345   K 3.5 06/11/2015 1345   K 3.4* 06/11/2015 1345   CL 102 06/11/2015 1345   CL 100* 06/11/2015 1345   CO2 24 06/11/2015 1345   GLUCOSE 131* 06/11/2015 1345   GLUCOSE 130* 06/11/2015 1345   BUN 16 06/11/2015 1345   BUN 19 06/11/2015 1345   CREATININE 0.82 06/11/2015 1345   CREATININE 0.80 06/11/2015 1345   CALCIUM 10.2 06/11/2015 1345   PROT 7.3 06/11/2015 1345   ALBUMIN 4.1 06/11/2015 1345   AST 26 06/11/2015 1345   ALT 30 06/11/2015 1345   ALKPHOS 55 06/11/2015 1345   BILITOT 0.1* 06/11/2015 1345   GFRNONAA >60 06/11/2015 1345   GFRAA >60 06/11/2015 1345   COAGS Lab Results  Component Value Date   INR 1.11 06/11/2015   INR 0.99 06/09/2013   Lipid Panel    Component Value Date/Time   CHOL 153 06/12/2015 0559   TRIG 139 06/12/2015 0559   HDL 39* 06/12/2015 0559   CHOLHDL 3.9 06/12/2015 0559   VLDL 28 06/12/2015 0559   LDLCALC 86 06/12/2015 0559   HgbA1C  Lab Results  Component Value Date   HGBA1C 6.6* 06/12/2015    SIGNIFICANT DIAGNOSTIC  STUDIES  Ct Head Wo Contrast  06/12/2015 Stable non contrast CT appearance of the brain, chronic right MCA infarct. No acute cortically based infarct or intracranial hemorrhage identified.  06/11/2015 No acute intracranial abnormality. Specifically, no CT features of acute ischemia on the current study. No evidence for acute hemorrhage. Old posterior right frontal infarct.  06/11/2015 Remote right frontal lobe infarct with encephalomalacia and subsequent dilation of the right lateral ventricle. Question acute extension of infarct along the superior margin.   CTA HEAD   06/11/2015 Moderate narrowing right internal carotid artery cavernous segment. Occluded M1 segment of the right middle cerebral artery. This appears to have progressed since the prior MR angiogram (which was significantly motion degraded). Decrease number of visualized right middle  cerebral artery branch vessels consistent with remote infarct and possibly acute infarct. Hypoplastic A1 segment right anterior cerebral artery. Mild irregularity and narrowing left internal carotid artery cavernous segment. Mild irregularity and narrowing left middle cerebral artery M1 segment. Left vertebral artery is dominant. Slight irregularity distal vertebral arteries. Narrowed irregular basilar artery.   CTA NECK   06/11/2015 Web-like narrowing proximal right internal carotid artery with less than 50% diameter stenosis. Ectatic distal left common carotid artery extends posterior and medial to the hyoid with hyoid compressing the anterior lateral aspect of the distal left common carotid artery with subsequent mild to slight moderate narrowing. No significant stenosis of the left carotid bifurcation. Left vertebral artery is dominant. Slight narrowing proximal left vertebral artery. Mild irregularity of the vertical segment of vertebral arteries bilaterally without high-grade stenosis. Scattered normal to top normal size lymph nodes more notable on the right. There may be coronary artery calcifications.   MRI brain  06/13/2015 1. Progressive nonhemorrhagic infarct involving the right basal ganglia as described. There is no extension into the right frontal lobe. 2. Remote anterior right MCA territory infarct involving the insular cortex aunt more anterior aspect of the basal ganglia. 3. Distal right MCA occlusion.  2D Echocardiogram  - Left ventricle: The cavity size was normal. Systolic function wasvigorous. The estimated ejection fraction was in the range of 65%to 70%. Wall motion was normal; there were no regional wallmotion abnormalities. The tissue Doppler parameters were normal.Left ventricular diastolic function parameters were normal. - Aortic valve: Trileaflet; normal thickness leaflets. There was noregurgitation. - Aortic root: The aortic root was normal in size. - Mitral valve:  Structurally normal valve. - Left atrium: The atrium was normal in size. - Right ventricle: The cavity size was normal. Wall thickness wasnormal. Systolic function was normal. - Right atrium: The atrium was normal in size. - Tricuspid valve: There was no regurgitation. - Pulmonic valve: There was no regurgitation. - Inferior vena cava: The vessel was normal in size. - Pericardium, extracardiac: There was no pericardial effusion. Impressions: Normal study.   Venous Duplex Lower Ext.  Negative for deep and superficial vein thrombosis in both lower extremities.      HISTORY OF PRESENT ILLNES TINNIE KUNIN is a 49 y.o. female with a history of previous stroke and MCA stenosis presents with sudden onset left sided weakness and right gaze deviation earlier today (LKW 06/11/2015 at 1245). She was given tpa at 1359 after discussion with her boyfriend and herself. After CTA, angiogram to possibly pursue IA therapy was discussed, but family/patient declined this therapy. She was admitted to the 2[facility] post tPA for further evaluation and treatment.   HOSPITAL COURSE Ms. ISOLDE SKAFF is a 49 y.o. female with history of prior stroke x  2, hypertension, hyperlipidemia, and asthma presenting with left sided weakness and right gaze deviation. She received IV t-PA, refused IR.   Stroke: Non-dominant right basal ganglia infarct s/p IV tPA, embolic, unknown source  Resultant Worsening Left hemiparesis  MRI Progressive R BG infarct  CTA head & neck intracranial atherosclerotic narrowing, occluded M1 at MCA. R sided top normal size lymph nodes  Post tPA CT without hemorrhage, age stroke  2D Echo No source of embolus   LE venous doppler negative  LDL 86  HgbA1c 6.6  aspirin 325 mg daily and clopidogrel 75 mg daily prior to admission, changed to clopidogrel 75 mg daily   Ongoing aggressive stroke risk factor management  Therapy recommendations: CIR  Disposition:  CIR  PFO  Known PFO from previous stroke workup  PFO on TEE in March 2008 but TCD 2014 neg  Repeat TCD bubble study as an OP if not done this admission  Hypertension  Stable  Hyperlipidemia  Home meds: pravachol 20   LDL 86, goal < 70  Resume statin   Continue statin at discharge  Hyperglycemia  Glu 130s  HgbA1c 6.6, at goal < 7.0  Other Stroke Risk Factors  Hx stroke/TIA  05/2013 - cryptogenic, posterior R external capsule and posterior R corona radiata, embolic  7494 cryptogenic, R frontal infarct (after 9 days of OCPs)  Decreased UOP  BUN/Cr normal  Decreased UOP  txd with Fluid bolus with increased IVF 11/1  Denied incontinence  Reports increased OP  Recommend UA if continued problems   DISCHARGE EXAM Blood pressure 152/77, pulse 72, temperature 98.6 F (37 C), temperature source Oral, resp. rate 16, height 5\' 10"  (1.778 m), weight 83 kg (182 lb 15.7 oz), last menstrual period 05/14/2015, SpO2 100 %. Pleasant frail middle aged african american lady not in distress. . Afebrile. Head is nontraumatic. Neck is supple without bruit. Cardiac exam no murmur or gallop. Lungs are clear to auscultation. Distal pulses are well felt. Neurological Exam :  Awake alert severe dysarthria but can be understood with some difficulty. No aphasia. Oriented 3. Extraocular movements are full range without nystagmus. Decreased blink to threat on the left compared to the right. Moderate left lower facial weakness. Tongue midline. Motor system exam reveals left hemiparesis with grade 3/5 left upper extremity strength with weakness of left grip and intrinsic hand muscles. Grade 4/5 left lower extremity strength with weakness of hip flexors and ankle dorsiflexors. Tone is increased on the left compared to the right. Sensation appears symmetric bilaterally. Deep tendon reflexes are brisker on the left compared to the right. Left plantar is upgoing right is downgoing. Gait was  not tested.   Discharge Diet  DIET DYS 2 Room service appropriate?: Yes; Fluid consistency:: Thin liquids  DISCHARGE PLAN  Disposition:  Transfer to [facility] for ongoing PT, OT and ST  clopidogrel 75 mg daily for secondary stroke prevention.  Recommend ongoing risk factor control by Primary Care Physician at time of discharge from inpatient rehabilitation.  Follow-up Nyoka Cowden, MD in 2 weeks following discharge from rehab.  Follow-up with Dr. Antony Contras, Stroke Clinic in 2 months.   32 minutes were spent preparing discharge.  [place] for Pager information 06/14/2015 12:05 PM  I have personally examined this patient, reviewed notes, independently viewed imaging studies, participated in medical decision making and plan of care. I have made any additions or clarifications directly to the above note. Agree with note above.  Antony Contras, MD Medical Director [facility] Pager: [phone] 06/14/2015 3:50 PM

## 2015-06-15 ENCOUNTER — Inpatient Hospital Stay (HOSPITAL_COMMUNITY): Payer: Medicaid Other | Admitting: Occupational Therapy

## 2015-06-15 ENCOUNTER — Inpatient Hospital Stay (HOSPITAL_COMMUNITY): Payer: Medicaid Other | Admitting: Speech Pathology

## 2015-06-15 ENCOUNTER — Inpatient Hospital Stay (HOSPITAL_COMMUNITY): Payer: Medicaid Other | Admitting: Physical Therapy

## 2015-06-15 LAB — CBC WITH DIFFERENTIAL/PLATELET
BASOS PCT: 0 %
Basophils Absolute: 0 10*3/uL (ref 0.0–0.1)
EOS ABS: 0.4 10*3/uL (ref 0.0–0.7)
Eosinophils Relative: 7 %
HCT: 30.2 % — ABNORMAL LOW (ref 36.0–46.0)
Hemoglobin: 10.4 g/dL — ABNORMAL LOW (ref 12.0–15.0)
Lymphocytes Relative: 38 %
Lymphs Abs: 2.2 10*3/uL (ref 0.7–4.0)
MCH: 30.3 pg (ref 26.0–34.0)
MCHC: 34.4 g/dL (ref 30.0–36.0)
MCV: 88 fL (ref 78.0–100.0)
MONO ABS: 0.7 10*3/uL (ref 0.1–1.0)
MONOS PCT: 11 %
NEUTROS ABS: 2.6 10*3/uL (ref 1.7–7.7)
NEUTROS PCT: 44 %
Platelets: 318 10*3/uL (ref 150–400)
RBC: 3.43 MIL/uL — ABNORMAL LOW (ref 3.87–5.11)
RDW: 14.4 % (ref 11.5–15.5)
WBC: 5.9 10*3/uL (ref 4.0–10.5)

## 2015-06-15 LAB — COMPREHENSIVE METABOLIC PANEL
ALBUMIN: 3.3 g/dL — AB (ref 3.5–5.0)
ALT: 35 U/L (ref 14–54)
ANION GAP: 7 (ref 5–15)
AST: 31 U/L (ref 15–41)
Alkaline Phosphatase: 46 U/L (ref 38–126)
BILIRUBIN TOTAL: 0.7 mg/dL (ref 0.3–1.2)
BUN: 6 mg/dL (ref 6–20)
CO2: 27 mmol/L (ref 22–32)
Calcium: 8.9 mg/dL (ref 8.9–10.3)
Chloride: 105 mmol/L (ref 101–111)
Creatinine, Ser: 0.75 mg/dL (ref 0.44–1.00)
GFR calc non Af Amer: >60 mL/min (ref >60)
GLUCOSE: 102 mg/dL — AB (ref 65–99)
POTASSIUM: 3.2 mmol/L — AB (ref 3.5–5.1)
SODIUM: 139 mmol/L (ref 135–145)
TOTAL PROTEIN: 6.2 g/dL — AB (ref 6.5–8.1)

## 2015-06-15 LAB — IRON AND TIBC
IRON: 35 ug/dL (ref 28–170)
SATURATION RATIOS: 10 % — AB (ref 10.4–31.8)
TIBC: 343 ug/dL (ref 250–450)
UIBC: 308 ug/dL

## 2015-06-15 MED ORDER — POTASSIUM CHLORIDE CRYS ER 20 MEQ PO TBCR
20.0000 meq | EXTENDED_RELEASE_TABLET | Freq: Every day | ORAL | Status: DC
  Administered 2015-06-15 – 2015-07-04 (×20): 20 meq via ORAL
  Filled 2015-06-15 (×20): qty 1

## 2015-06-15 NOTE — Progress Notes (Signed)
Patient information reviewed and entered into eRehab system by Di Jasmer, RN, CRRN, PPS Coordinator.  Information including medical coding and functional independence measure will be reviewed and updated through discharge.    

## 2015-06-15 NOTE — Evaluation (Signed)
Physical Therapy Assessment and Plan  Patient Details  Name: Rebecca Yoder MRN: [id number] Date of Birth: Aug 09, 1966  PT Diagnosis: Abnormal posture, Abnormality of gait, Cognitive deficits, Coordination disorder, Difficulty walking, Edema, Hemiplegia non-dominant, Hypotonia, Impaired cognition, Impaired sensation and Muscle weakness Rehab Potential: Good ELOS: 3 weeks   Today's Date: 06/15/2015 PT Individual Time:  -  830-930 Treatment Session 2: 1430-1500    Treatment Session 1: 60 min Treatment Session 2: 30 min  Problem List:  Patient Active Problem List   Diagnosis Date Noted  . Right middle cerebral artery stroke ([place]) 06/14/2015  . Left hemiparesis ([place])   . Left-sided neglect   . Acute left hemiparesis ([place]) 06/13/2015  . Hemi-neglect of left side 06/13/2015  . Stroke (cerebrum) ([place]) 06/11/2015  . TIA (transient ischemic attack) 06/09/2013  . Hypokalemia 06/09/2013  . PFO (patent foramen ovale) 06/09/2013  . CVA (cerebral infarction) 06/09/2013  . Hyperlipidemia 12/10/2009  . Essential hypertension 12/10/2009  . Asthma 12/10/2009  . CEREBROVASCULAR ACCIDENT, HX OF 12/10/2009    Past Medical History:  Past Medical History  Diagnosis Date  . ASTHMA 12/10/2009  . CEREBROVASCULAR ACCIDENT, HX OF 12/10/2009  . HYPERLIPIDEMIA 12/10/2009  . HYPERTENSION 12/10/2009  . Complication of anesthesia     "just can't get me up" (06/09/2013)  . Stroke [facility]) 2008    denies residual on 06/09/2013  . Tendonitis of wrist, right    Past Surgical History:  Past Surgical History  Procedure Laterality Date  . Ovarian cyst removal  1990's    Assessment & Plan Clinical Impression:  Rebecca Yoder is a 49 y.o. right handed female with history of hypertension, previous right brain subcortical infarct October 2014 as well as remote right frontal insular infarct 2008 and MCA stenosis with known PFO maintained on aspirin 325 mg daily. Patient lives with her sister and mother who has cancer  and undergoing treatment at this time. Independent prior to admission and driving. One level house with steps to entry Presented 06/11/2015 with sudden onset of left-sided weakness, dysarthria and right gaze deviation. Cranial CT scan negative for acute changes. Patient did receive TPA. CTA of head and neck shows occluded M1 segment of the right middle cerebral artery that appeared to have progressed since prior MR angiogram. Narrowing proximal right internal carotid artery with less than 50% diameter stenosis. MRI 06/13/2015 shows progressive nonhemorrhagic infarct involving the right basal ganglia. Remote anterior right MCA territory infarct involving the insular cortex.. Echocardiogram with ejection fraction of 70% no wall motion abnormalities. Venous Dopplers lower extremities negative for DVT. Neurology consulted with workup and maintained on Plavix therapy for CVA prophylaxis. Dysphagia #2 thin liquid diet. Physical therapy evaluation completed with recommendations of physical medicine rehabilitation consult. Patient transferred to CIR on 06/14/2015 .   Patient currently requires total with mobility secondary to muscle weakness, decreased cardiorespiratoy endurance, abnormal tone, unbalanced muscle activation and decreased coordination, R gaze preference, decreased awareness, decreased safety awareness and decreased memory and decreased sitting balance, decreased standing balance, decreased postural control, hemiplegia and decreased balance strategies.  Prior to hospitalization, patient was independent  with mobility and lived with Family in a House home.  Home access is 3Ramped entrance, Stairs to enter (see below).  Patient will benefit from skilled PT intervention to maximize safe functional mobility, minimize fall risk and decrease caregiver burden for planned discharge home with 24 hour assist.  Anticipate patient will benefit from follow up [facility] at discharge.  PT - End of Session Activity  Tolerance:  Tolerates 10 - 20 min activity with multiple rests Endurance Deficit: Yes Endurance Deficit Description: cardiorespiratory PT Assessment Rehab Potential (ACUTE/IP ONLY): Good Barriers to Discharge: Inaccessible home environment (Pt reports d/c planned to sister's home and she may get BF to build a ramp) PT Patient demonstrates impairments in the following area(s): Balance;Behavior;Edema;Endurance;Motor;Pain;Safety;Sensory;Perception PT Transfers Functional Problem(s): Bed Mobility;Bed to Chair;Car;Furniture PT Locomotion Functional Problem(s): Ambulation;Wheelchair Mobility;Stairs PT Plan PT Intensity: Minimum of 1-2 x/day ,45 to 90 minutes PT Frequency: 5 out of 7 days PT Duration Estimated Length of Stay: 3 weeks PT Treatment/Interventions: Ambulation/gait training;DME/adaptive equipment instruction;Psychosocial support;UE/LE Strength taining/ROM;Balance/vestibular training;Functional electrical stimulation;UE/LE Coordination activities;Cognitive remediation/compensation;Functional mobility training;Splinting/orthotics;Visual/perceptual remediation/compensation;Community reintegration;Neuromuscular re-education;Stair training;Wheelchair propulsion/positioning;Discharge planning;Pain management;Therapeutic Activities;Disease management/prevention;Patient/family education;Therapeutic Exercise PT Transfers Anticipated Outcome(s): supervision PT Locomotion Anticipated Outcome(s): supervision short distance ambulation; mod I w/c management PT Recommendation Follow Up Recommendations: Home health PT Equipment Recommended: To be determined Equipment Details: Pt reports she has access to a RW at home (mom has 2)   Skilled Therapeutic Intervention Treatment Session 1: PT Evaluation - Pt received in bed - verbalizes urinary urgency and incontinence/accident due to unable to get to [facility] fast enough. Pt has significant L hemiplegia with low tone in L UE and L LE, poor posture in sit & stand - leaning L,  decreased memory, low activity tolerance, and difficulty with all aspects of functional mobility. Therapeutic Activity - see function tab for details - pt has difficulty utilizing L UE & LE to assist with bed mobility and transfers. L knee buckles in stand and pt leans L, despite PT cues to lean R in stand. W/C Management - pt req up to min A to steer hemi-height manual w/c using R hemi technique and verbal cues. Pt req assist for w/c parts management. Gait Training - See function tab for details. Pt req +2 assist (max A from PT under L shoulder) with +2 for w/c follow using R wall rail - PT blocks R knee from buckling/hyperextension in stance phase and assists L LE in swing phase - pt req verbal cues for sequencing, as well. Pt ended up in w/c with quick release belt in place, all needs in reach, and nurse tech in room changing bedsheets. Pt motivated and is likely to benefit from IPR PT.   Treatment Session 2: Pt received up in w/c - agreeable to PT session. Therapeutic Activity - Pt reports she will receive a ride in a sedan on the way home. PT instructs pt in squat-pivot transfer w/c to/from low car req max A. Pt req assist to get L LE out of car and assist at trunk while managing legs in/out of car. Once back in room, pt asks to brush her teeth. PT fills a cup of water for pt to rinse her mouth with and opens the toothbrush holder for pt - she does the rest from w/c level. Gait Training - PT instructs pt in ascending a 3" step with R rail forward and descending it backward req max A. With PT blocking pt's L knee from buckling and providing sequencing cues. Pt ended in bed to rest with all needs in reach. Continue per PT POC.    PT Evaluation Precautions/Restrictions Precautions Precautions: Fall Precaution Comments: left hemiparesis Restrictions Weight Bearing Restrictions: No General Chart Reviewed: Yes Family/Caregiver Present: No Vital SignsTherapy Vitals Pulse Rate: 86 BP: (!) 149/87  mmHg Patient Position (if appropriate): Lying Oxygen Therapy SpO2: 100 % O2 Device: Not Delivered Pain Pain Assessment Pain Assessment: No/denies  pain  Treatment Session 2: Pt denies pain.   Home Living/Prior Functioning Home Living Available Help at Discharge: Family;Available 24 hours/day Type of Home: House Home Access: Ramped entrance;Stairs to enter (see below) Entrance Stairs-Number of Steps: 3 Entrance Stairs-Rails: Can reach both Home Layout: One level Bathroom Shower/Tub: Walk-in shower;Tub/shower unit (tub-shower at [employer]; walk-in shower at sister's) Bathroom Toilet: Handicapped height (tall toilets at sister's; standard toilet height at mom's) Bathroom Accessibility: Yes Additional Comments: Pt reports d/cing to sister's home, which has 3 STE with B rails and is one level. Previously, pt was living at her mom's which has a ramp entrance and is one level. Per pt, mom is pretty mobile, uses a RW. Pt and sister helped with cooking & cleaning.   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 Vision/Perception    R gaze preference Cognition Overall Cognitive Status: Within Functional Limits for tasks assessed Arousal/Alertness: Awake/alert Orientation Level: Oriented X4 Attention: Focused;Sustained Focused Attention: Appears intact Sustained Attention: Appears intact Memory: Impaired Memory Impairment: Decreased recall of new information (1/4 words recalled) Awareness: Impaired Awareness Impairment: Emergent impairment Executive Function: Self Correcting Self Correcting: Impaired Self Correcting Impairment: Functional basic Behaviors: Impulsive Safety/Judgment: Impaired Comments: impulsive with movement Sensation Sensation Light Touch: Impaired Detail Light Touch Impaired  Details: Impaired LUE;Impaired LLE Stereognosis: Not tested Hot/Cold: Not tested Proprioception: Impaired Detail Proprioception Impaired Details: Impaired LLE;Impaired LUE Additional Comments: tingly L hand and foot Coordination Gross Motor Movements are Fluid and Coordinated: No Fine Motor Movements are Fluid and Coordinated: No Coordination and Movement Description: L side hemiplegia Finger Nose Finger Test: impaired L side due to hemiplegia Heel Shin Test: impaired L side due to hemiplegia Motor  Motor Motor: Hemiplegia;Abnormal postural alignment and control;Abnormal tone Motor - Skilled Clinical Observations: head postured in L side flexion with R gaze preference, pt leans L in stand, R UE/LE low tone  Mobility Bed Mobility Bed Mobility: Rolling Right;Rolling Left;Right Sidelying to Sit;Sit to Supine Rolling Right: 4: Min assist Rolling Right Details: Manual facilitation for placement;Manual facilitation for weight shifting;Verbal cues for technique;Verbal cues for precautions/safety Rolling Left: 5: Supervision Rolling Left Details: Verbal cues for precautions/safety Right Sidelying to Sit: 3: Mod assist Right Sidelying to Sit Details: Manual facilitation for placement;Manual facilitation for weight shifting;Verbal cues for precautions/safety;Verbal cues for technique;Verbal cues for sequencing Sit to Supine: 3: Mod assist Sit to Supine - Details: Manual facilitation for placement;Verbal cues for sequencing;Verbal cues for technique;Verbal cues for precautions/safety;Manual facilitation for weight shifting Transfers Transfers: Yes Sit to Stand: 2: Max assist;With armrests;With upper extremity assist;From bed;From chair/3-in-1 Sit to Stand Details: Manual facilitation for weight shifting;Manual facilitation for placement;Verbal cues for technique;Verbal cues for precautions/safety;Verbal cues for sequencing Stand to Sit: 2: Max assist;To bed;With armrests;With upper extremity  assist;To chair/3-in-1 Stand to Sit Details (indicate cue type and reason): Manual facilitation for placement;Manual facilitation for weight shifting;Verbal cues for sequencing;Verbal cues for technique;Verbal cues for precautions/safety Stand Pivot Transfers: 2: Max assist Stand Pivot Transfer Details: Manual facilitation for weight shifting;Manual facilitation for placement;Verbal cues for technique;Verbal cues for precautions/safety;Verbal cues for sequencing;Manual facilitation for weight bearing Locomotion  Ambulation Ambulation: Yes Ambulation/Gait Assistance: 1: +2 Total assist Ambulation Distance (Feet): 30 Feet Assistive device: Other (Comment) (R wall rail) Ambulation/Gait Assistance Details: Manual facilitation for weight shifting;Manual facilitation for placement;Manual facilitation for weight bearing;Verbal cues for technique;Verbal cues for  sequencing;Verbal cues for precautions/safety;Verbal cues for safe use of DME/AE;Verbal cues for gait pattern Ambulation/Gait Assistance Details: PT blocks L knee from hyperextension & buckling, assists with lateral weight shift, assists with L LE progression, and gives verbal cues for upright posture and glute max activation during L stance Gait Gait: Yes Gait Pattern: Impaired Gait Pattern: Step-through pattern;Decreased step length - right;Decreased stance time - left;Decreased stride length;Decreased hip/knee flexion - left;Decreased dorsiflexion - left;Decreased weight shift to right;Poor foot clearance - left;Trunk flexed Stairs / Additional Locomotion Stairs: No Wheelchair Mobility Wheelchair Mobility: Yes Wheelchair Assistance: 4: Advertising account executive Details: Manual facilitation for placement;Verbal cues for technique;Verbal cues for Information systems manager: Right upper extremity;Right lower extremity Wheelchair Parts Management: Needs assistance Distance: 100'  Trunk/Postural Assessment  Cervical  Assessment Cervical Assessment: Within Functional Limits (although pt postures head in L side flexion) Thoracic Assessment Thoracic Assessment: Within Functional Limits Lumbar Assessment Lumbar Assessment: Within Functional Limits Postural Control Postural Control: Deficits on evaluation Head Control: head postured in L cervical side flexion Trunk Control: L lean in sit & stand Righting Reactions: delayed, especially to the L side Protective Responses: not present - impaired by hemiplegia Postural Limitations: slouched, sacral sit, and L lean in sit with head in L side flexion  Balance Balance Balance Assessed: Yes Static Sitting Balance Static Sitting - Balance Support: Right upper extremity supported;Feet supported Static Sitting - Level of Assistance: 5: Stand by assistance Dynamic Sitting Balance Dynamic Sitting - Balance Support: Right upper extremity supported;Feet supported Dynamic Sitting - Level of Assistance: 4: Min assist Static Standing Balance Static Standing - Balance Support: Right upper extremity supported;During functional activity Static Standing - Level of Assistance: 3: Mod assist Dynamic Standing Balance Dynamic Standing - Balance Support: Right upper extremity supported;During functional activity Dynamic Standing - Level of Assistance: 2: Max assist Dynamic Standing - Balance Activities: Lateral lean/weight shifting Extremity Assessment  RUE Assessment RUE Assessment: Within Functional Limits LUE Assessment LUE Assessment: Exceptions to WFL LUE AROM (degrees) Overall AROM Left Upper Extremity: Deficits;Other (comment) (due to weakness) LUE Overall AROM Comments: UE moves in flexor synergy, with increased time pt able to achieve 30 degrees arm flexion, 45 degrees elbow flexion, minimal wrist extension, full grip, 25% limited finger extension grossly LUE Strength LUE Overall Strength: Deficits LUE Overall Strength Comments: arm flexion 2+/5, elbow flexion  2-/5, grip 3/5, wrist extension 2+/5, finger extension 3-/5 LUE Tone LUE Tone: Hypotonic RLE Assessment RLE Assessment: Within Functional Limits LLE Assessment LLE Assessment: Exceptions to WFL LLE AROM (degrees) Overall AROM Left Lower Extremity: Deficits;Due to decreased strength LLE Overall AROM Comments: hip flexion limited 50%; no active knee extension, minimal knee flexion, no active ankle DF LLE Strength LLE Overall Strength: Deficits LLE Overall Strength Comments: hip flexion 3-/5, knee extension 1/5, knee flexion 2-/5, ankle DF 0/5 LLE Tone LLE Tone: Mild;Hypertonic   See Function Navigator for Current Functional Status.   Refer to Care Plan for Long Term Goals  Recommendations for other services: None  Discharge Criteria: Patient will be discharged from PT if patient refuses treatment 3 consecutive times without medical reason, if treatment goals not met, if there is a change in medical status, if patient makes no progress towards goals or if patient is discharged from hospital.  The above assessment, treatment plan, treatment alternatives and goals were discussed and mutually agreed upon: by patient  [facility] M 06/15/2015, 10:59 AM

## 2015-06-15 NOTE — Care Management Note (Signed)
[place] Individual Statement of Services  Patient Name:  Rebecca Yoder  Date:  06/15/2015  Welcome to the [facility].  Our goal is to provide you with an individualized program based on your diagnosis and situation, designed to meet your specific needs.  With this comprehensive rehabilitation program, you will be expected to participate in at least 3 hours of rehabilitation therapies Monday-Friday, with modified therapy programming on the weekends.  Your rehabilitation program will include the following services:  Physical Therapy (PT), Occupational Therapy (OT), Speech Therapy (ST), 24 hour per day rehabilitation nursing, Therapeutic Recreaction (TR), Neuropsychology, Case Management (Social Worker), Rehabilitation Medicine, Nutrition Services and Pharmacy Services  Weekly team conferences will be held on Wednesdays to discuss your progress.  Your Social Worker will talk with you frequently to get your input and to update you on team discussions.  Team conferences with you and your family in attendance may also be held.  Expected length of stay: 14-21 days  Overall anticipated outcome: supervision/ min assist  Depending on your progress and recovery, your program may change. Your Social Worker will coordinate services and will keep you informed of any changes. Your Social Worker's name and contact numbers are listed  below.  The following services may also be recommended but are not provided by the [place] will be made to provide these services after discharge if needed.  Arrangements include referral to agencies that provide these services.  Your insurance has been verified to be:  None (will assist with Medicaid application) Your primary doctor is:  Dr. Burnice Logan  Pertinent  information will be shared with your doctor and your insurance company.  Social Worker:  [place], [place] or ([phone]   Information discussed with and copy given to patient by: Lennart Pall, 06/15/2015, 1:21 PM

## 2015-06-15 NOTE — Progress Notes (Signed)
Subjective/Complaints: Voiding overnite per CNA  Objective: Vital Signs: Blood pressure 137/69, pulse 63, temperature 98.4 F (36.9 C), temperature source Oral, resp. rate 18, height _0  (1.626 m), weight 81.693 kg (180 lb 1.6 oz), last menstrual period 05/14/2015, SpO2 98 %. Mr Brain Wo Contrast  06/13/2015  CLINICAL DATA:  Sudden onset of left-sided weakness and right gaze deviation. EXAM: MRI HEAD WITHOUT CONTRAST TECHNIQUE: Multiplanar, multiecho pulse sequences of the brain and surrounding structures were obtained without intravenous contrast. COMPARISON:  CT head without contrast 08/16/2009 [facility]. CTA head and neck 06/11/2015. FINDINGS: MRI confirms an acute nonhemorrhagic infarct involving the residual posterior right lentiform nucleus and body of the caudate. There is ex vacuo dilation of the right lateral ventricle from the previous infarct. T2 changes are associated with the areas of acute infarction. There is sparing of the residual right caudate head. A remote lacunar infarct is noted on the left adjacent to the frontal horn. Flow is present at the skullbase. There is no flow in the distal right MCA. No other significant left-sided disease is present. The cerebellum and brainstem are intact. The paranasal sinuses and mastoid air cells are clear. IMPRESSION: 1. Progressive nonhemorrhagic infarct involving the right basal ganglia as described. There is no extension into the right frontal lobe. 2. Remote anterior right MCA territory infarct involving the insular cortex aunt more anterior aspect of the basal ganglia. 3. Distal right MCA occlusion. Electronically Signed   By: San Morelle M.D.   On: 06/13/2015 16:51   Results for orders placed or performed during the hospital encounter of 06/14/15 (from the past 72 hour(s))  CBC WITH DIFFERENTIAL     Status: Abnormal   Collection Time: 06/15/15  4:51 AM  Result Value Ref Range   WBC 5.9 4.0 - 10.5 K/uL   RBC 3.43 (L) 3.87 - 5.11  MIL/uL   Hemoglobin 10.4 (L) 12.0 - 15.0 g/dL   HCT 30.2 (L) 36.0 - 46.0 %   MCV 88.0 78.0 - 100.0 fL   MCH 30.3 26.0 - 34.0 pg   MCHC 34.4 30.0 - 36.0 g/dL   RDW 14.4 11.5 - 15.5 %   Platelets 318 150 - 400 K/uL   Neutrophils Relative % 44 %   Neutro Abs 2.6 1.7 - 7.7 K/uL   Lymphocytes Relative 38 %   Lymphs Abs 2.2 0.7 - 4.0 K/uL   Monocytes Relative 11 %   Monocytes Absolute 0.7 0.1 - 1.0 K/uL   Eosinophils Relative 7 %   Eosinophils Absolute 0.4 0.0 - 0.7 K/uL   Basophils Relative 0 %   Basophils Absolute 0.0 0.0 - 0.1 K/uL  Comprehensive metabolic panel     Status: Abnormal   Collection Time: 06/15/15  4:51 AM  Result Value Ref Range   Sodium 139 135 - 145 mmol/L   Potassium 3.2 (L) 3.5 - 5.1 mmol/L   Chloride 105 101 - 111 mmol/L   CO2 27 22 - 32 mmol/L   Glucose, Bld 102 (H) 65 - 99 mg/dL   BUN 6 6 - 20 mg/dL   Creatinine, Ser 0.75 0.44 - 1.00 mg/dL   Calcium 8.9 8.9 - 10.3 mg/dL   Total Protein 6.2 (L) 6.5 - 8.1 g/dL   Albumin 3.3 (L) 3.5 - 5.0 g/dL   AST 31 15 - 41 U/L   ALT 35 14 - 54 U/L   Alkaline Phosphatase 46 38 - 126 U/L   Total Bilirubin 0.7 0.3 - 1.2 mg/dL  GFR calc non Af Amer >60 >60 mL/min   GFR calc Af Amer >60 >60 mL/min    Comment: (NOTE) The eGFR has been calculated using the CKD EPI equation. This calculation has not been validated in all clinical situations. eGFR's persistently <60 mL/min signify possible Chronic Kidney Disease.    Anion gap 7 5 - 15     HEENT: normal Cardio: RRR and no murmur Resp: CTA B/L and unlabored GI: BS positive and NT, ND Extremity:  Pulses positive and No Edema Skin:   Intact Neuro: Alert/Oriented, Flat, Cranial Nerve Abnormalities Left central 7, Abnormal Sensory sensation reduced in fingers and toes on left , Abnormal Motor 2- Left delt, bi, tri, grip, 3- HF, 2- KE, trace ADF, Abnormal FMC Ataxic/ dec FMC, Dysarthric and Inattention Musc/Skel:  Other no pain with LUE or LLE ROM Gen  NAD   Assessment/Plan: 1. Functional deficits secondary to Right MCA infarct with L Hemiparesis and Left hemineglect which require 3+ hours per day of interdisciplinary therapy in a comprehensive inpatient rehab setting. Physiatrist is providing close team supervision and 24 hour management of active medical problems listed below. Physiatrist and rehab team continue to assess barriers to discharge/monitor patient progress toward functional and medical goals. FIM:                   Function - Comprehension Comprehension: Auditory  Function - Expression Expression: Verbal  Function - Social Interaction Social Interaction assist level: Interacts appropriately 75 - 89% of the time - Needs redirection for appropriate language or to initiate interaction.  Function - Problem Solving Problem solving assist level: Solves basic 50 - 74% of the time/requires cueing 25 - 49% of the time  Function - Memory Patient normally able to recall (first 3 days only): Current season, Location of own room, Staff names and faces, That he or she is in a hospital  Medical Problem List and Plan: 1. Functional deficits secondary to right MCA infarct with history of prior CVA 2008 as well as 2014 2. DVT Prophylaxis/Anticoagulation: SCDs. Venous Doppler is negative 3. Pain Management: Tylenol as needed 4. Dysphagia. Dysphagia #2 thin liquid diet. Monitor for any signs of aspiration. Follow-up speech therapy 5. Neuropsych: This patient is capable of making decisions on her own behalf. 6. Skin/Wound Care: Routine skin checks 7. Fluids/Electrolytes/Nutrition: Routine high nose with follow-up chemistries 8. Hyperlipidemia. Continue Pravachol 9. Hypertension. Norvasc 2.5 mg daily, Cozaar 100 mg daily, HCTZ 12.5 mg daily. Monitor with increased mobility, 137/69 on 11/4- ok 10. History of asthma. Patient on albuterol inhaler as needed prior to admission, occ wheezing noted on exam 11.  Hypokalemia seen on  bloodwork 11/4- likely related to HCTZ, will supplement with KCL 12.  Mild Anemia- menstrual period currently check stool guaic,  Fe studies LOS (Days) 1 A FACE TO FACE EVALUATION WAS PERFORMED  Rebecca Yoder 06/15/2015, 6:42 AM

## 2015-06-15 NOTE — Progress Notes (Signed)
Retta Diones, RN Rehab Admission Coordinator Signed Physical Medicine and Rehabilitation PMR Pre-admission 06/14/2015 2:20 PM  Related encounter: ED to Hosp-Admission (Discharged) from 06/11/2015 in [facility]   PMR Admission Coordinator Pre-Admission Assessment  Patient: Rebecca Yoder is an 49 y.o., female MRN: [id number] DOB: 11/15/1965 Height: 5\' 10"  (177.8 cm) Weight: 83 kg (182 lb 15.7 oz)  Insurance Information Self pay - no insurance. Did have [employer] plan but plan terminated 09/10/14. No insurance with current employer.  Medicaid Application Date: Case Manager:  Disability Application Date: Case Worker:   Emergency Contact Information Contact Information    Name Relation Home Work Mobile   Dillon Sister   [phone]   Lashauna, Arpin Mother [phone]     Charna Archer [phone]       Current Medical History  Patient Admitting Diagnosis: Right MCA infarct with left hemiparesis and left neglect   History of Present Illness: A 49 y.o. right handed female with history of hypertension, previous right brain subcortical infarct October 2014 as well as remote right frontal insular infarct 2008 and MCA stenosis with known PFO maintained on aspirin 325 mg daily. Patient lives with her sister and mother who has cancer and undergoing treatment at this time. Independent prior to admission and driving. One level house with steps to entry Presented 06/11/2015 with sudden onset of left-sided weakness, dysarthria and right gaze deviation. Cranial CT scan negative for acute changes. Patient did receive TPA. CTA of head and neck shows occluded M1 segment of the right middle cerebral artery that appeared to have progressed since prior  MR angiogram. Narrowing proximal right internal carotid artery with less than 50% diameter stenosis. MRI is pending. Echocardiogram with ejection fraction of 70% no wall motion abnormalities. Neurology consulted with workup ongoing plan aspirin and Plavix therapy. Dysphagia #2 thin liquid diet. Physical and occupational therapy evaluations pending. M.D. has requested physical medicine rehabilitation consult.  Total: 9=NIH  Past Medical History  Past Medical History  Diagnosis Date  . ASTHMA 12/10/2009  . CEREBROVASCULAR ACCIDENT, HX OF 12/10/2009  . HYPERLIPIDEMIA 12/10/2009  . HYPERTENSION 12/10/2009  . Complication of anesthesia     "just can't get me up" (06/09/2013)  . Stroke [facility]) 2008    denies residual on 06/09/2013  . Tendonitis of wrist, right     Family History  family history is not on file.  Prior Rehab/Hospitalizations: No previous rehab admissions.  Has the patient had major surgery during 100 days prior to admission? No  Current Medications   Current facility-administered medications:  . 0.9 % sodium chloride infusion, , Intravenous, Continuous, Sharl Ma, MD, Last Rate: 100 mL/hr at 06/13/15 0826 . 0.9 % sodium chloride infusion, , Intravenous, Continuous, Donzetta Starch, NP, Last Rate: 125 mL/hr at 06/14/15 0958 . acetaminophen (TYLENOL) tablet 650 mg, 650 mg, Oral, Q4H PRN **OR** acetaminophen (TYLENOL) suppository 650 mg, 650 mg, Rectal, Q4H PRN, Greta Doom, MD . amLODipine (NORVASC) tablet 2.5 mg, 2.5 mg, Oral, Daily, Donzetta Starch, NP, 2.5 mg at 06/14/15 0947 . clopidogrel (PLAVIX) tablet 75 mg, 75 mg, Oral, Daily, Donzetta Starch, NP, 75 mg at 06/14/15 0946 . losartan (COZAAR) tablet 100 mg, 100 mg, Oral, Daily, 100 mg at 06/14/15 0946 **AND** hydrochlorothiazide (MICROZIDE) capsule 12.5 mg, 12.5 mg, Oral, Daily, Jake Church Masters, RPH, 12.5 mg at 06/14/15 0946 . labetalol (NORMODYNE,TRANDATE) injection 10 mg, 10 mg,  Intravenous, Q10 min PRN, Addison Lank P  Leonel Ramsay, MD . multivitamin with minerals tablet 1 tablet, 1 tablet, Oral, Daily, Donzetta Starch, NP, 1 tablet at 06/14/15 0947 . pantoprazole sodium (PROTONIX) 40 mg/20 mL oral suspension 40 mg, 40 mg, Per Tube, Daily, Garvin Fila, MD, 40 mg at 06/13/15 1505 . pravastatin (PRAVACHOL) tablet 20 mg, 20 mg, Oral, Daily, Donzetta Starch, NP, 20 mg at 06/14/15 0946 . senna-docusate (Senokot-S) tablet 1 tablet, 1 tablet, Oral, QHS PRN, Greta Doom, MD  Patients Current Diet: DIET DYS 2 Room service appropriate?: Yes; Fluid consistency:: Thin  Precautions / Restrictions Precautions Precautions: Fall Precaution Comments: left hemiparesis Restrictions Weight Bearing Restrictions: No   Has the patient had 2 or more falls or a fall with injury in the past year?No  Prior Activity Level Community (5-7x/wk): Went out 5 days a week to work PT at [employer].  Home Assistive Devices / Equipment    Prior Device Use: Indicate devices/aids used by the patient prior to current illness, exacerbation or injury? None  Prior Functional Level Prior Function Level of Independence: Independent Comments: Works as a Astronomer. No reported deficits from prior CVA reported.   Self Care: Did the patient need help bathing, dressing, using the toilet or eating? Independent  Indoor Mobility: Did the patient need assistance with walking from room to room (with or without device)? Independent  Stairs: Did the patient need assistance with internal or external stairs (with or without device)? Independent  Functional Cognition: Did the patient need help planning regular tasks such as shopping or remembering to take medications? Independent  Current Functional Level Cognition  Overall Cognitive Status: Within Functional Limits for tasks assessed Orientation Level: Oriented X4   Extremity Assessment (includes  Sensation/Coordination)  Upper Extremity Assessment: Defer to OT evaluation, LUE deficits/detail (Weak grip noted left hand. Able to grasp and extend digits purposefully.) LUE Deficits / Details: Able to initiate finger to thumb with increased time. LUE Sensation: decreased light touch LUE Coordination: decreased fine motor, decreased gross motor  Lower Extremity Assessment: LLE deficits/detail LLE Deficits / Details: Grossly ~1/5 knee extension, 1/5 knee flexion, 2/5 hip flexion, 0/5 DF/PF. LLE Sensation: decreased light touch LLE Coordination: decreased gross motor, decreased fine motor    ADLs       Mobility  Overal bed mobility: Needs Assistance Bed Mobility: Supine to Sit, Sit to Supine, Rolling Rolling: Min assist Supine to sit: Mod assist, +2 for physical assistance, HOB elevated Sit to supine: Max assist, +2 for physical assistance General bed mobility comments: Sitting in chair upon PT arrival.     Transfers  Overall transfer level: Needs assistance Equipment used: None Transfers: Sit to/from Stand Sit to Stand: Mod assist, +2 physical assistance General transfer comment: Stood from chair x2 to assist with pericare. x3 during session. Cues for hand placement/foot placement and technique. Cues for anterior translation and weight shift. Therapist blocking left knee to prevent buckling. Cues and Mod A for controlled descent into chair.    Ambulation / Gait / Stairs / Office manager / Balance Dynamic Sitting Balance Sitting balance - Comments: Able to sit unsupported for short periods however fatigues quickly. Able to perform reciprocal scooting back in chair and to edge of chair with cues.  Balance Overall balance assessment: Needs assistance Sitting-balance support: Feet supported, Single extremity supported Sitting balance-Leahy Scale: Fair Sitting balance - Comments: Able to sit unsupported for short periods however fatigues quickly.  Able to perform reciprocal scooting  back in chair and to edge of chair with cues.  Postural control: Posterior lean Standing balance support: During functional activity Standing balance-Leahy Scale: Zero Standing balance comment: Tolerated standing x3 for ~1 minute, ~1 minute, ~45 sec with weight shifting, focusing on upright posture. Therapist facilitating thoracic and lumbar extensors and hip extension for upright posture with WB through LUE on rail. Cues for slow descent into chair. Requiring therapist blocking left knee to prevent buckling.     Special needs/care consideration BiPAP/CPAP No CPM No Continuous Drip IV 0.9% NS at 125 ml/hr Dialysis No  Life Vest No Oxygen No Special Bed No Trach Size No Wound Vac (area) No  Skin No  Bowel mgmt: Patient reports no BM in the past 1 week Bladder mgmt: Voiding on bedpan with incontinence at times. Diabetic mgmt No    Previous Home Environment  Lives With: Family Available Help at Discharge: Family, Available 24 hours/day (Sister reports she can provide 24/7 S at discharge if needed.)  Discharge Living Setting Plans for Discharge Living Setting: House, Lives with (comment) (Plans home with sister and brother-in-law.) Type of Home at Discharge: House Discharge Home Layout: One level Discharge Home Access: Stairs to enter Entrance Stairs-Number of Steps: 3  Social/Family/Support Systems Patient Roles: Other (Comment) (Has 2 sisters, 4 brothers and other extended family.) Contact Information: Samya Siciliano - sister [phone] Anticipated Caregiver: sister and niece and other family. Ability/Limitations of Caregiver: sister, [name] works 12 hr shifts. Sisters husband works 3rd shift. 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 Brother now helping her mom. Pt/Family Agrees to Admission and willing to participate: Yes Program Orientation Provided & Reviewed with Pt/Caregiver Including Roles & Responsibilities: Yes  Decrease burden of Care through IP rehab admission: N/A  Possible need for SNF placement upon discharge: Yes, but likely family will be able to assist at discharge.  Patient Condition: This patient's condition remains as documented in the consult dated 06/13/15, in which the Rehabilitation Physician determined and documented that the patient's condition is appropriate for intensive rehabilitative care in an inpatient rehabilitation facility. Will admit to inpatient rehab today.  Preadmission Screen Completed By: Retta Diones, 06/14/2015 2:29 PM ______________________________________________________________________  Discussed status with Dr. Letta Pate on 06/14/15 at 1429 and received telephone approval for admission today.  Admission Coordinator: Retta Diones time1429/Date11/03/16          Cosigned by: [name], MD at 06/14/2015 2:52 PM  Revision History     Date/Time User Provider Type Action   06/14/2015 2:52 PM [name], MD Physician Cosign   06/14/2015 2:29 PM Retta Diones, RN Rehab Admission Coordinator Sign

## 2015-06-15 NOTE — Evaluation (Signed)
Occupational Therapy Assessment and Plan  Patient Details  Name: Rebecca Yoder MRN: [id number] Date of Birth: 1966/07/07  OT Diagnosis: cognitive deficits and hemiplegia affecting non-dominant side Rehab Potential: Rehab Potential (ACUTE ONLY): Excellent ELOS: 21-22 days   Today's Date: 06/15/2015 OT Individual Time: 4098-1191 OT Individual Time Calculation (min): 67 min     Problem List:  Patient Active Problem List   Diagnosis Date Noted  . Right middle cerebral artery stroke ([place]) 06/14/2015  . Left hemiparesis ([place])   . Left-sided neglect   . Acute left hemiparesis ([place]) 06/13/2015  . Hemi-neglect of left side 06/13/2015  . Stroke (cerebrum) ([place]) 06/11/2015  . TIA (transient ischemic attack) 06/09/2013  . Hypokalemia 06/09/2013  . PFO (patent foramen ovale) 06/09/2013  . CVA (cerebral infarction) 06/09/2013  . Hyperlipidemia 12/10/2009  . Essential hypertension 12/10/2009  . Asthma 12/10/2009  . CEREBROVASCULAR ACCIDENT, HX OF 12/10/2009    Past Medical History:  Past Medical History  Diagnosis Date  . ASTHMA 12/10/2009  . CEREBROVASCULAR ACCIDENT, HX OF 12/10/2009  . HYPERLIPIDEMIA 12/10/2009  . HYPERTENSION 12/10/2009  . Complication of anesthesia     "just can't get me up" (06/09/2013)  . Stroke [facility]) 2008    denies residual on 06/09/2013  . Tendonitis of wrist, right    Past Surgical History:  Past Surgical History  Procedure Laterality Date  . Ovarian cyst removal  1990's    Assessment & Plan Clinical Impression: Rebecca Yoder is a 49 y.o. right handed female with history of hypertension, previous right brain subcortical infarct October 2014 as well as remote right frontal insular infarct 2008 and MCA stenosis with known PFO maintained on aspirin 325 mg daily. Patient lives with her sister and mother who has cancer and undergoing treatment at this time. Independent prior to admission and driving. One level house with steps to entry Presented 06/11/2015 with  sudden onset of left-sided weakness, dysarthria and right gaze deviation. Cranial CT scan negative for acute changes. Patient did receive TPA. CTA of head and neck shows occluded M1 segment of the right middle cerebral artery that appeared to have progressed since prior MR angiogram. Narrowing proximal right internal carotid artery with less than 50% diameter stenosis. MRI 06/13/2015 shows progressive nonhemorrhagic infarct involving the right basal ganglia. Remote anterior right MCA territory infarct involving the insular cortex.. Echocardiogram with ejection fraction of 70% no wall motion abnormalities. Venous Dopplers lower extremities negative for DVT. Neurology consulted with workup and maintained on Plavix therapy for CVA prophylaxis. Dysphagia #2 thin liquid diet. Physical therapy evaluation completed with recommendations of physical medicine rehabilitation consult. Patient was admitted for comprehensive rehabilitation program  Patient transferred to CIR on 06/14/2015 .    Patient currently requires mod with basic self-care skills secondary to decreased cardiorespiratoy endurance, abnormal tone and decreased coordination, decreased memory and decreased sitting balance, decreased standing balance, decreased postural control and hemiplegia.  Prior to hospitalization, patient was fully independent, driving, working part time. Patient will benefit from skilled intervention to increase independence with basic self-care skills prior to discharge home with care partner.  Anticipate patient will require intermittent supervision and follow up home health.  OT - End of Session Activity Tolerance: Tolerates 10 - 20 min activity with multiple rests Endurance Deficit: Yes Endurance Deficit Description: cardiorespiratory OT Assessment Rehab Potential (ACUTE ONLY): Excellent OT Patient demonstrates impairments in the following area(s): Balance;Cognition;Endurance;Motor;Safety;Sensory OT Basic ADL's Functional  Problem(s): Eating;Grooming;Bathing;Dressing;Toileting OT Advanced ADL's Functional Problem(s): Simple Meal Preparation;Light Housekeeping OT Transfers Functional  Problem(s): Toilet;Tub/Shower OT Additional Impairment(s): Fuctional Use of Upper Extremity OT Plan OT Intensity: Minimum of 1-2 x/day, 45 to 90 minutes OT Frequency: 5 out of 7 days OT Duration/Estimated Length of Stay: 21-22 days OT Treatment/Interventions: Balance/vestibular training;Cognitive remediation/compensation;Discharge planning;DME/adaptive equipment instruction;Functional mobility training;Self Care/advanced ADL retraining;Patient/family education;Neuromuscular re-education;Therapeutic Exercise;UE/LE Coordination activities;UE/LE Strength taining/ROM;Therapeutic Activities;Psychosocial support OT Self Feeding Anticipated Outcome(s): mod I OT Basic Self-Care Anticipated Outcome(s): supervision/set up OT Toileting Anticipated Outcome(s): supervision/ set up OT Bathroom Transfers Anticipated Outcome(s): supervision/ set up OT Recommendation Patient destination: Home Follow Up Recommendations: Home health OT Equipment Recommended: Tub/shower seat   Skilled Therapeutic Intervention Pt seen for initial evaluation, review of OT POC and pt's goals, and ADL retraining to include shower and dressing.  Pt utilized squat pivot transfers with mod a to transfer to shower bench. Pt demonstrated good body awareness and ability to follow directions with cuing for positioning during transfers and sit to stand. Pt need L knee supported to prevent buckling.  Pt became slightly dizzy after transfer, rested and then was able to continue with the session.  Pt introduced to hemi dressing techniques.  Overall, required mod A today with good attempts at using L hand as an active assist. Provided pt with left half lap tray and pt recalled that she needed a  quick release belt on w/c. Pt in room with all needs met.  OT  Evaluation Precautions/Restrictions   Falls Pain Pain Assessment Pain Assessment: No/denies pain Home Living/Prior Functioning Home Living Available Help at Discharge: Family, Available 24 hours/day Type of Home: House Home Access: Ramped entrance, Stairs to enter (see below) Entrance Stairs-Number of Steps: 3 Entrance Stairs-Rails: Can reach both Home Layout: One level Bathroom Shower/Tub: Walk-in shower, Tub/shower unit (tub-shower at [employer]; walk-in shower at sister's) Bathroom Toilet: Handicapped height (tall toilets at sister's; standard toilet height at [employer]) Bathroom Accessibility: Yes Additional Comments: Pt reports d/cing to sister's home, which has 3 STE with B rails and is one level. Previously, pt was living at her mom's which has a ramp entrance and is one level. Per pt, mom is pretty mobile, uses a RW. Pt and sister helped with cooking & cleaning.   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 ADL ADL ADL Comments: refer to functional navigator Vision/Perception  Vision- History Baseline Vision/History: Wears glasses Wears Glasses: At all times Patient Visual Report: No change from baseline Vision- Assessment Vision Assessment?: No apparent visual deficits Perception Comments: WFL  Cognition Overall Cognitive Status: History of cognitive impairments - at baseline Arousal/Alertness: Awake/alert Orientation Level: Person;Place;Situation Person: Oriented Place: Oriented Situation: Oriented Year: 2016 Month: November Day of Week: Correct Memory: Impaired Memory Impairment: Decreased recall of new information Immediate Memory Recall: Sock;Blue;Bed Memory  Recall: Sock;Blue;Bed Memory Recall Sock: Without Cue Memory Recall Blue: Without Cue Memory Recall Bed: Without Cue Attention: Selective Focused Attention: Appears intact Sustained Attention: Appears intact Selective Attention: Appears intact Awareness: Impaired Awareness Impairment: Emergent impairment Problem Solving: Appears intact Executive Function: Self Correcting Self Correcting: Impaired Self Correcting Impairment: Functional basic;Verbal basic Behaviors: Impulsive Safety/Judgment: Impaired Comments: impulsive with movement Sensation Sensation Light Touch: Impaired Detail Light Touch Impaired Details: Impaired LUE;Impaired LLE Stereognosis: Not tested Hot/Cold: Appears Intact Proprioception: Impaired Detail Proprioception Impaired Details: Impaired LLE;Impaired LUE Additional  Comments: tingly L hand and foot Coordination Gross Motor Movements are Fluid and Coordinated: No Fine Motor Movements are Fluid and Coordinated: No Coordination and Movement Description: L side hemiplegia Finger Nose Finger Test: impaired L side due to hemiplegia Heel Shin Test: impaired L side due to hemiplegia Motor  Motor Motor: Hemiplegia;Abnormal postural alignment and control;Abnormal tone Motor - Skilled Clinical Observations: head postured in L side flexion with R gaze preference, pt leans L in stand, R UE/LE low tone Mobility  Bed Mobility Bed Mobility: Rolling Right;Rolling Left;Right Sidelying to Sit;Sit to Supine Rolling Right: 4: Min assist Rolling Right Details: Manual facilitation for placement;Manual facilitation for weight shifting;Verbal cues for technique;Verbal cues for precautions/safety Rolling Left: 5: Supervision Rolling Left Details: Verbal cues for precautions/safety Right Sidelying to Sit: 3: Mod assist Right Sidelying to Sit Details: Manual facilitation for placement;Manual facilitation for weight shifting;Verbal cues for precautions/safety;Verbal cues for  technique;Verbal cues for sequencing Sit to Supine: 3: Mod assist Sit to Supine - Details: Manual facilitation for placement;Verbal cues for sequencing;Verbal cues for technique;Verbal cues for precautions/safety;Manual facilitation for weight shifting Transfers Sit to Stand: 2: Max assist;With armrests;With upper extremity assist;From bed;From chair/3-in-1 Sit to Stand Details: Manual facilitation for weight shifting;Manual facilitation for placement;Verbal cues for technique;Verbal cues for precautions/safety;Verbal cues for sequencing Stand to Sit: 2: Max assist;To bed;With armrests;With upper extremity assist;To chair/3-in-1 Stand to Sit Details (indicate cue type and reason): Manual facilitation for placement;Manual facilitation for weight shifting;Verbal cues for sequencing;Verbal cues for technique;Verbal cues for precautions/safety  Trunk/Postural Assessment  Cervical Assessment Cervical Assessment: Within Functional Limits (although pt postures head in L side flexion) Thoracic Assessment Thoracic Assessment: Within Functional Limits Lumbar Assessment Lumbar Assessment: Within Functional Limits Postural Control Postural Control: Deficits on evaluation Head Control: head postured in L cervical side flexion Trunk Control: L lean in sit & stand Righting Reactions: delayed, especially to the L side Protective Responses: not present - impaired by hemiplegia Postural Limitations: slouched, sacral sit, and L lean in sit with head in L side flexion  Balance Balance Balance Assessed: Yes Static Sitting Balance Static Sitting - Balance Support: Right upper extremity supported;Feet supported Static Sitting - Level of Assistance: 5: Stand by assistance Dynamic Sitting Balance Dynamic Sitting - Balance Support: Right upper extremity supported;Feet supported Dynamic Sitting - Level of Assistance: 4: Min assist Static Standing Balance Static Standing - Balance Support: Right upper extremity  supported;During functional activity Static Standing - Level of Assistance: 3: Mod assist Dynamic Standing Balance Dynamic Standing - Balance Support: Right upper extremity supported;During functional activity Dynamic Standing - Level of Assistance: 2: Max assist Dynamic Standing - Balance Activities: Lateral lean/weight shifting Extremity/Trunk Assessment RUE Assessment RUE Assessment: Within Functional Limits LUE Assessment LUE Assessment: Exceptions to WFL LUE AROM (degrees) Overall AROM Left Upper Extremity: Deficits;Other (comment) (due to weakness) LUE Overall AROM Comments: UE moves in flexor synergy, with increased time pt able to achieve 30 degrees arm flexion, 45 degrees elbow flexion, minimal wrist extension, full grip, 25% limited finger extension grossly LUE Strength LUE Overall Strength: Deficits LUE Overall Strength Comments: arm flexion 2+/5, elbow flexion 2-/5, grip 3/5, wrist extension 2+/5, finger extension 3-/5 LUE Tone LUE Tone: Hypotonic   See Function Navigator for Current Functional Status.   Refer to Care Plan for Long Term Goals  Recommendations for other services: None  Discharge Criteria: Patient will be discharged from OT if patient refuses treatment 3 consecutive times without medical reason, if treatment goals not met, if there is a  change in medical status, if patient makes no progress towards goals or if patient is discharged from hospital.  The above assessment, treatment plan, treatment alternatives and goals were discussed and mutually agreed upon: by patient  [facility] 06/15/2015, 12:41 PM

## 2015-06-15 NOTE — IPOC Note (Signed)
Overall Plan of Care [facility]) Patient Details Name: Rebecca Yoder MRN: [id number] DOB: 1965-11-21  Admitting Diagnosis: R CVA  Hospital Problems: Active Problems:   Acute left hemiparesis (HCC)   Hemi-neglect of left side   Right middle cerebral artery stroke (HCC)   Left hemiparesis ([place])   Left-sided neglect     Functional Problem List: Nursing Bladder, Bowel, Edema, Endurance, Medication Management, Nutrition, Pain, Safety, Sensory, Skin Integrity  PT Balance, Behavior, Edema, Endurance, Motor, Pain, Safety, Sensory, Perception  OT Balance, Cognition, Endurance, Motor, Safety, Sensory  SLP Cognition, Nutrition, Linguistic  TR         Basic ADL's: OT Eating, Grooming, Bathing, Dressing, Toileting     Advanced  ADL's: OT Simple Meal Preparation, Light Housekeeping     Transfers: PT Bed Mobility, Bed to Chair, Car, Manufacturing systems engineer, Metallurgist: PT Ambulation, Emergency planning/management officer, Stairs     Additional Impairments: OT Fuctional Use of Upper Extremity  SLP Swallowing, Communication, Social Cognition expression Awareness  TR      Anticipated Outcomes Item Anticipated Outcome  Self Feeding mod I  Swallowing  mod I with least restrictive diet    Basic self-care  supervision/set up  Toileting  supervision/ set up   Bathroom Transfers supervision/ set up  Bowel/Bladder  mod assist  Transfers  supervision  Locomotion  supervision short distance ambulation; mod I w/c management  Communication  supervision  Cognition  supervision   Pain  <3 on a 0-10 scale  Safety/Judgment  mod assist   Therapy Plan: PT Intensity: Minimum of 1-2 x/day ,45 to 90 minutes PT Frequency: 5 out of 7 days PT Duration Estimated Length of Stay: 3 weeks OT Intensity: Minimum of 1-2 x/day, 45 to 90 minutes OT Frequency: 5 out of 7 days OT Duration/Estimated Length of Stay: 21-22 days SLP Intensity: Minumum of 1-2 x/day, 30 to 90 minutes SLP Frequency: 3 to  5 out of 7 days SLP Duration/Estimated Length of Stay: 14-21 days       Team Interventions: Nursing Interventions Patient/Family Education, Bladder Management, Bowel Management, Disease Management/Prevention, Pain Management, Medication Management, Skin Care/Wound Management, Dysphagia/Aspiration Precaution Training, Discharge Planning, Psychosocial Support  PT interventions Ambulation/gait training, DME/adaptive equipment instruction, Psychosocial support, UE/LE Strength taining/ROM, Balance/vestibular training, Functional electrical stimulation, UE/LE Coordination activities, Cognitive remediation/compensation, Functional mobility training, Splinting/orthotics, Visual/perceptual remediation/compensation, Community reintegration, Neuromuscular re-education, IT trainer, Wheelchair propulsion/positioning, Discharge planning, Pain management, Therapeutic Activities, Disease management/prevention, Barrister's clerk education, Therapeutic Exercise  OT Interventions Training and development officer, Cognitive remediation/compensation, Discharge planning, DME/adaptive equipment instruction, Functional mobility training, Self Care/advanced ADL retraining, Patient/family education, Neuromuscular re-education, Therapeutic Exercise, UE/LE Coordination activities, UE/LE Strength taining/ROM, Therapeutic Activities, Psychosocial support  SLP Interventions Cognitive remediation/compensation, Cueing hierarchy, Internal/external aids, Functional tasks, Patient/family education, Environmental controls, Dysphagia/aspiration precaution training  TR Interventions    SW/CM Interventions Discharge Planning, Psychosocial Support, Patient/Family Education    Team Discharge Planning: Destination: PT-  ,OT- Home , SLP-Home Projected Follow-up: PT-Home health PT, OT-  Home health OT, SLP-24 hour supervision/assistance, Home Health SLP, Outpatient SLP Projected Equipment Needs: PT-To be determined, OT- Tub/shower seat, SLP-None  recommended by SLP Equipment Details: PT-Pt reports she has access to a RW at home (mom has 2) , OT-  Patient/family involved in discharge planning: PT- Patient,  OT-Patient, SLP-Patient  MD ELOS: 18-21 Medical Rehab Prognosis:  Good Assessment: 49 year old female with prior history of CVA was recently admitted with a right MCA infarct with left hemiparesis.  Now requiring 24/7 Rehab RN,MD,  as well as CIR level PT, OT and SLP.  Treatment team will focus on ADLs and mobility with goals set at    See Team Conference Notes for weekly updates to the plan of care

## 2015-06-15 NOTE — Progress Notes (Signed)
Social Work  Social Work Assessment and Plan  Patient Details  Name: Rebecca Yoder MRN: [id number] Date of Birth: 03-Feb-1966  Today's Date: 06/15/2015  Problem List:  Patient Active Problem List   Diagnosis Date Noted  . Right middle cerebral artery stroke ([place]) 06/14/2015  . Left hemiparesis ([place])   . Left-sided neglect   . Acute left hemiparesis ([place]) 06/13/2015  . Hemi-neglect of left side 06/13/2015  . Stroke (cerebrum) ([place]) 06/11/2015  . TIA (transient ischemic attack) 06/09/2013  . Hypokalemia 06/09/2013  . PFO (patent foramen ovale) 06/09/2013  . CVA (cerebral infarction) 06/09/2013  . Hyperlipidemia 12/10/2009  . Essential hypertension 12/10/2009  . Asthma 12/10/2009  . CEREBROVASCULAR ACCIDENT, HX OF 12/10/2009   Past Medical History:  Past Medical History  Diagnosis Date  . ASTHMA 12/10/2009  . CEREBROVASCULAR ACCIDENT, HX OF 12/10/2009  . HYPERLIPIDEMIA 12/10/2009  . HYPERTENSION 12/10/2009  . Complication of anesthesia     "just can't get me up" (06/09/2013)  . Stroke [facility]) 2008    denies residual on 06/09/2013  . Tendonitis of wrist, right    Past Surgical History:  Past Surgical History  Procedure Laterality Date  . Ovarian cyst removal  1990's   Social History:  reports that she has been smoking Cigarettes.  She has been smoking about 0.50 packs per day. She has never used smokeless tobacco. She reports that she drinks alcohol. She reports that she does not use illicit drugs.  Family / Support Systems Marital Status: Single Patient Roles: Other (Comment) (sibilings,  p/t employee) Spouse/Significant Other: boyfriend of 2 yrs, Eula Listen Other Supports: sister, Elida Harbin @ [phone] (she, her husband and her son live together with pt);  sister, Carlisle Cater @ ([phone];  plus 4 other siblings  Anticipated Caregiver: sister  and other family hope to share assistance needs. Ability/Limitations of Caregiver: sister, [name] works 12 hr shifts.   Sisters husband works 3rd shift. One brother is w/c bound and the other siblings all work. Caregiver Availability: Other (Comment) (sister working to coordinate 24/7 care) Family Dynamics: Pt describes very good relationship with family, however, do not yet have plan for 24/7 assistance.  She notes that she had been the caregiver for her mother PTA and siblings have now "stepped up" to help.  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  Says she enjoys her job. Legal Hisotry/Current Legal Issues: None Guardian/Conservator: None.  Per MD, pt capable of making decisions on her own behalf.   Abuse/Neglect Physical Abuse: Denies Verbal Abuse: Denies Sexual Abuse: Denies Exploitation of patient/patient's resources: Denies Self-Neglect: Denies  Emotional Status Pt's affect, behavior adn adjustment status: Pt with some slight impairment in speech, however, able to complete assessment interview without difficulty.  She quickly denies any s/s of any emotional distress and states she is eager to get started on her therapies.  She is very focused with me during the interview and appreciative of any help I might be able to offer.  Will monitor mood and refer for more formal screen as indicated. Recent Psychosocial Issues: Has had prior CVAs but with good recovery and able to assist mother and work. Pyschiatric History: None Substance Abuse History: None  Patient / Family  Perceptions, Expectations & Goals Pt/Family understanding of illness & functional limitations: Pt and family with basic understanding of her CVA and resulting functional limitations/ need for CIR. Premorbid pt/family roles/activities: As  noted, pt was providing support/ assist to mother and working p/t (6hrs /day x 5 days/wk) Anticipated changes in roles/activities/participation: Pt will now require 24/7 assistance based on tx goals.  Family working to coordinate who will be primary caregivers. Pt/family expectations/goals: "I just hope I can get back to work."  [employer]: None Premorbid Home Care/DME Agencies: None Transportation available at discharge: yes Resource referrals recommended: Neuropsychology, Support group (specify)  Discharge Planning Living Arrangements: Other relatives Support Systems: Other relatives Type of Residence: Private residence Insurance Resources: Teacher, adult education Resources:  (was working PTA) Museum/gallery curator Screen Referred: No Living Expenses: Education officer, community Management: Patient Does the patient have any problems obtaining your medications?: Yes (Describe) (currently no insurance) Home Management: pt Patient/Family Preliminary Plans: Pt to return home with sister and brother -in-law who hope to have 24/7 support arranged Barriers to Discharge: Family Support (most family members working) Social Work Anticipated Follow Up Needs: HH/OP Expected length of stay: 18-21 days  Clinical Impression Very pleasant woman here following a 3rd CVA.  Talkative and optimistic about therapies.  Pt. hopeful she can eventually  return to her work.  Primary concern will be regarding d/c planning and whether family will be able to arrange 24/7 support as most siblings are working.  Per tx goals, pt should reach supervision/ min assist LOC.  Pt denies any s/s of any emotional distress, however, will monitor.  Will also confirm that pt will have Medicaid and SSD apps begun while here.  Continue to follow.  Paulyne Mooty 06/15/2015, 2:19 PM

## 2015-06-15 NOTE — Evaluation (Signed)
Speech Language Pathology Assessment and Plan  Patient Details  Name: Rebecca Yoder MRN: [id number] Date of Birth: 02-02-1966  SLP Diagnosis: Dysarthria;Cognitive Impairments;Dysphagia  Rehab Potential: Good ELOS: 14-21 days    Today's Date: 06/15/2015 SLP Individual Time: 1000-1100 SLP Individual Time Calculation (min): 60 min   Problem List:  Patient Active Problem List   Diagnosis Date Noted  . Right middle cerebral artery stroke ([place]) 06/14/2015  . Left hemiparesis ([place])   . Left-sided neglect   . Acute left hemiparesis ([place]) 06/13/2015  . Hemi-neglect of left side 06/13/2015  . Stroke (cerebrum) ([place]) 06/11/2015  . TIA (transient ischemic attack) 06/09/2013  . Hypokalemia 06/09/2013  . PFO (patent foramen ovale) 06/09/2013  . CVA (cerebral infarction) 06/09/2013  . Hyperlipidemia 12/10/2009  . Essential hypertension 12/10/2009  . Asthma 12/10/2009  . CEREBROVASCULAR ACCIDENT, HX OF 12/10/2009   Past Medical History:  Past Medical History  Diagnosis Date  . ASTHMA 12/10/2009  . CEREBROVASCULAR ACCIDENT, HX OF 12/10/2009  . HYPERLIPIDEMIA 12/10/2009  . HYPERTENSION 12/10/2009  . Complication of anesthesia     "just can't get me up" (06/09/2013)  . Stroke [facility]) 2008    denies residual on 06/09/2013  . Tendonitis of wrist, right    Past Surgical History:  Past Surgical History  Procedure Laterality Date  . Ovarian cyst removal  1990's    Assessment / Plan / Recommendation Clinical Impression  Rebecca Yoder is a 49 y.o. right handed female with history of hypertension, previous right brain subcortical infarct October 2014 as well as remote right frontal insular infarct 2008. Presented 06/11/2015 with sudden onset of left-sided weakness, dysarthria and right gaze deviation. Cranial CT scan negative for acute changes.  MRI 06/13/2015 shows progressive nonhemorrhagic infarct involving the right basal ganglia. Remote anterior right MCA territory infarct involving the  insular cortex.Marland KitchenDysphagia #2 thin liquid diet. Patient was admitted for comprehensive rehabilitation program on 06/14/2015.  SLP evaluation completed on 06/15/2015 with the following results: Pt presents with a mild primarily orally based dysphagia due to left sided labial, lingual, and buccal weakness which results in prolonged oral manipulation and left sided buccal residue with consumption of solids.  No overt s/s of aspiration evident with thin liquids despite being challenged with large consecutive boluses.  Recommend that pt remain on dys 2, thin liquids with intermittent supervision for use of swallowing precautions Pt also presents with a mild-moderate dysarthria due to left sided oral motor deficits listed above which result in imprecise articulation of consonants in phrases and sentences.  Suspect that pt is near baseline for cognition; however, pt was noted with impulsivity during movement and would benefit from skilled education and training for recall and use of safety precautions.   Pt would benefit from skilled ST while inpatient in order to maximize functional independence and reduce burden of care prior to discharge.  Anticipate that pt may need at least intermittent supervision at discharge in addition to [place] follow up at next level of care and assist for medication and financial management.    Skilled Therapeutic Interventions          Cognitive-linguistic and bedside swallowing evaluation completed with results and recommendations reviewed with patient.     SLP Assessment  Patient will need skilled Speech Lanaguage Pathology Services during CIR admission    Recommendations  SLP Diet Recommendations: Dysphagia 2 (Fine chop);Thin Liquid Administration via: Cup;Straw Medication Administration: Whole meds with puree Supervision: Patient able to self feed;Intermittent supervision to cue for compensatory strategies  Compensations: Slow rate;Small sips/bites;Check for pocketing Postural  Changes and/or Swallow Maneuvers: Seated upright 90 degrees Oral Care Recommendations: Oral care BID Patient destination: Home Follow up Recommendations: 24 hour supervision/assistance;Home Health SLP;Outpatient SLP Equipment Recommended: None recommended by SLP    SLP Frequency 3 to 5 out of 7 days   SLP Treatment/Interventions Cognitive remediation/compensation;Cueing hierarchy;Internal/external aids;Functional tasks;Patient/family education;Environmental controls;Dysphagia/aspiration precaution training   Pain Pain Assessment Pain Assessment: No/denies pain Prior Functioning Cognitive/Linguistic Baseline: Baseline deficits Baseline deficit details: baseline memory deficits, hx of 3 previous strokes  Type of Home: House  Lives With: Family Available Help at Discharge: Family;Available 24 hours/day Education: 12th grade  Vocation: Part time employment  Function:  Eating Eating   Modified Consistency Diet: Yes Eating Assist Level: Supervision or verbal cues           Cognition Comprehension Comprehension assist level: Follows basic conversation/direction with no assist  Expression   Expression assist level: Expresses basic 50 - 74% of the time/requires cueing 25 - 49% of the time. Needs to repeat parts of sentences.  Social Interaction Social Interaction assist level: Interacts appropriately 75 - 89% of the time - Needs redirection for appropriate language or to initiate interaction.  Problem Solving Problem solving assist level: Solves basic 75 - 89% of the time/requires cueing 10 - 24% of the time  Memory Memory assist level: Recognizes or recalls 50 - 74% of the time/requires cueing 25 - 49% of the time   Short Term Goals: Week 1: SLP Short Term Goal 1 (Week 1): Pt will utilize increased vocal intensity, pacing, and overarticulation to achieve intelligibility in sentences with min assist verbal cues.  SLP Short Term Goal 2 (Week 1): Pt will return demonstration of at least  2 safety precautions during functional tasks over 3 targeted sessions with min assist verbal and visual cues.  SLP Short Term Goal 3 (Week 1): Pt will consume dys 2 textures and thin liquids with supervision cues to monitor and correct left sided buccal residue over 2 targeted sessions prior to diet advancement.   SLP Short Term Goal 4 (Week 1): Pt will consume advanced solids with supervision cues to monitor and correct left sided buccal residue over 2 targeted session prior to diet advancement..    Refer to Care Plan for Long Term Goals  Recommendations for other services: None  Discharge Criteria: Patient will be discharged from SLP if patient refuses treatment 3 consecutive times without medical reason, if treatment goals not met, if there is a change in medical status, if patient makes no progress towards goals or if patient is discharged from hospital.  The above assessment, treatment plan, treatment alternatives and goals were discussed and mutually agreed upon: by patient  Emilio Math 06/15/2015, 12:40 PM

## 2015-06-15 NOTE — Progress Notes (Signed)
[name], MD Physician Signed Physical Medicine and Rehabilitation Consult Note 06/13/2015 6:02 AM  Related encounter: ED to Hosp-Admission (Discharged) from 06/11/2015 in [place] Collapse All        Physical Medicine and Rehabilitation Consult Reason for Consult: Right brain infarct Referring Physician: Dr. Leonie Man   HPI: Rebecca Yoder is a 49 y.o. right handed female with history of hypertension, previous right brain subcortical infarct October 2014 as well as remote right frontal insular infarct 2008 and MCA stenosis with known PFO maintained on aspirin 325 mg daily. Patient lives with her sister and mother who has cancer and undergoing treatment at this time. Independent prior to admission and driving. One level house with steps to entry Presented 06/11/2015 with sudden onset of left-sided weakness, dysarthria and right gaze deviation. Cranial CT scan negative for acute changes. Patient did receive TPA. CTA of head and neck shows occluded M1 segment of the right middle cerebral artery that appeared to have progressed since prior MR angiogram. Narrowing proximal right internal carotid artery with less than 50% diameter stenosis. MRI is pending. Echocardiogram with ejection fraction of 70% no wall motion abnormalities. Neurology consulted with workup ongoing plan aspirin and Plavix therapy. Dysphagia #2 thin liquid diet. Physical and occupational therapy evaluations pending. M.D. has requested physical medicine rehabilitation consult  Sister at bedside, states that she would be available for post hospital care. Patient lives with her sister  Review of Systems  Constitutional: Negative for fever and chills.  HENT: Negative for hearing loss.  Eyes: Negative for blurred vision and double vision.  Respiratory: Negative for cough and shortness of breath.  Cardiovascular: Negative for chest pain, palpitations and leg swelling.    Gastrointestinal: Positive for constipation. Negative for nausea, vomiting and abdominal pain.  Genitourinary: Negative for dysuria and flank pain.  Musculoskeletal: Positive for myalgias.  Skin: Negative for rash.  Neurological: Positive for weakness. Negative for seizures, loss of consciousness and headaches.  All other systems reviewed and are negative.  Past Medical History  Diagnosis Date  . ASTHMA 12/10/2009  . CEREBROVASCULAR ACCIDENT, HX OF 12/10/2009  . HYPERLIPIDEMIA 12/10/2009  . HYPERTENSION 12/10/2009  . Complication of anesthesia     "just can't get me up" (06/09/2013)  . Stroke [facility]) 2008    denies residual on 06/09/2013  . Tendonitis of wrist, right    Past Surgical History  Procedure Laterality Date  . Ovarian cyst removal  1990's   History reviewed. No pertinent family history. Social History:  reports that she has been smoking Cigarettes. She has been smoking about 0.50 packs per day. She has never used smokeless tobacco. She reports that she drinks alcohol. She reports that she does not use illicit drugs. Allergies:  Allergies  Allergen Reactions  . Penicillins Rash   Medications Prior to Admission  Medication Sig Dispense Refill  . amLODipine (NORVASC) 2.5 MG tablet TAKE ONE TABLET BY MOUTH ONCE DAILY 30 tablet 2  . aspirin 325 MG tablet Take 325 mg by mouth daily.    . Ibuprofen-Diphenhydramine Cit (ADVIL PM) 200-38 MG TABS Take 2 tablets by mouth at bedtime.    Marland Kitchen losartan-hydrochlorothiazide (HYZAAR) 100-12.5 MG per tablet TAKE ONE TABLET BY MOUTH ONCE DAILY 90 tablet 2  . Multiple Vitamin (MULTIVITAMIN WITH MINERALS) TABS tablet Take 1 tablet by mouth daily.    . pravastatin (PRAVACHOL) 20 MG tablet TAKE ONE TABLET BY MOUTH ONCE DAILY 30 tablet 2  .  acetaminophen (TYLENOL) 325 MG tablet Take 2 tablets (650 mg total) by mouth every 6 (six) hours as needed. 20 tablet 0  .  albuterol (PROAIR HFA) 108 (90 BASE) MCG/ACT inhaler Inhale 2 puffs into the lungs every 6 (six) hours as needed for wheezing. 9 g 5  . clopidogrel (PLAVIX) 75 MG tablet TAKE ONE TABLET BY MOUTH ONCE DAILY (Patient not taking: Reported on 06/11/2015) 90 tablet 1  . predniSONE (DELTASONE) 20 MG tablet Take 2 tablets (40 mg total) by mouth daily with breakfast. (Patient not taking: Reported on 06/11/2015) 8 tablet 0    Home: Home Living Available Help at Discharge: Family, Available 24 hours/day Lives With: Family  Functional History:   Functional Status:  Mobility:          ADL:    Cognition: Cognition Overall Cognitive Status: Within Functional Limits for tasks assessed Orientation Level: Oriented X4 Cognition Overall Cognitive Status: Within Functional Limits for tasks assessed  Blood pressure 133/70, pulse 76, temperature 98.6 F (37 C), temperature source Oral, resp. rate 19, height 5\' 10"  (1.778 m), weight 83 kg (182 lb 15.7 oz), last menstrual period 05/14/2015, SpO2 98 %. Physical Exam  HENT:  Right gaze preference  Eyes:  Pupil reactive to light  Neck: Normal range of motion. Neck supple. No thyromegaly present.  Cardiovascular: Normal rate and regular rhythm.  Respiratory: Effort normal and breath sounds normal. No respiratory distress.  GI: Soft. Bowel sounds are normal. She exhibits no distension.  Neurological: She is alert.  Dysarthric speech but intelligible. Emotional lability. Oriented to person and place as well as date of birth. Fair awareness of deficits. Follows simple commands  Skin: Skin is warm and dry.  Visual fields difficult to assess due to poor cooperation with confrontation testing Tactile neglect on the left side in the left upper and lower limb Motor strength is 2 minus in the left deltoid, biceps, triceps, grip, finger flexors, finger extensors 3 minus/5 strength in the left hip flexor and knee extensor to minus and ankle  dorsiflexor and plantar flexor 4/5 in the right deltoid, biceps, triceps, grip, hip flexor, knee extensor, ankle dorsi flexors and plantar flexor Sensory is able identify light touch in the left hand and left foot however she notes that the sensation does not feel same as on the right side.   Lab Results Last 24 Hours    No results found for this or any previous visit (from the past 24 hour(s)).    Imaging Results (Last 48 hours)    Ct Angio Head W/cm &/or Wo Cm  06/11/2015 CLINICAL DATA: 49 year old hypertensive female with hyperlipidemia and history of prior stroke presenting with sudden onset of left-sided paralysis and slurred speech. Subsequent encounter. EXAM: CT ANGIOGRAPHY HEAD AND NECK TECHNIQUE: Multidetector CT imaging of the head and neck was performed using the standard protocol during bolus administration of intravenous contrast. Multiplanar CT image reconstructions and MIPs were obtained to evaluate the vascular anatomy. Carotid stenosis measurements (when applicable) are obtained utilizing NASCET criteria, using the distal internal carotid diameter as the denominator. CONTRAST: 39mL OMNIPAQUE IOHEXOL 350 MG/ML SOLN COMPARISON: 06/11/2015 and 06/09/2013 unenhanced head CT. 06/09/2013 brain MR and MR angiogram. FINDINGS: CT HEAD Brain: Remote right frontal lobe infarct with encephalomalacia and subsequent dilation of the right lateral ventricle. Question acute extension of infarct along the superior margin. No intracranial hemorrhage. No intracranial mass or abnormal enhancement. Calvarium and skull base: Negative. Paranasal sinuses: Clear. Orbits: Negative. Prominent cerumen right ear. CTA NECK  Aortic arch: 2 vessel aortic arch with mild ectasia. Pulsation artifact limits evaluation of the ascending thoracic aorta. Right carotid system: Web-like narrowing proximal right internal carotid artery with less than 50% diameter stenosis. Left carotid system: Ectatic left common carotid artery.  The distal left common carotid artery extends posterior and medial to the hyoid with hyoid compressing the anterior lateral aspect of the distal left common carotid artery with subsequent mild to slight moderate narrowing. No significant stenosis of the left carotid bifurcation Vertebral arteries:Mild narrowing distal left subclavian artery. Left vertebral artery is dominant. Slight narrowing proximal left vertebral artery. Mild irregularity of the vertical segment of vertebral arteries bilaterally without high-grade stenosis. Skeleton: Mild cervical spondylotic changes most prominent C5-6. Other neck: No worrisome primary neck mass. Scattered normal to top normal size lymph nodes more notable on the right. No worrisome lung apical lesion. There may be a coronary artery calcifications. CTA HEAD Anterior circulation: Moderate narrowing right internal carotid artery cavernous segment. Fetal type contribution to the right posterior cerebral artery. Occluded M1 segment of the right middle cerebral artery. This appears to have progressed since the prior MR angiogram (which was significantly motion degraded). Decrease number of visualized right middle cerebral artery branch vessels consistent with remote infarct and possibly acute infarct. Hypoplastic A1 segment right anterior cerebral artery. Mild irregularity and narrowing left internal carotid artery cavernous segment. Mild irregularity and narrowing left middle cerebral artery M1 segment. Posterior circulation: Left vertebral artery is dominant. Slight irregularity distal vertebral arteries. Narrowed irregular basilar artery. Venous sinuses: Patent. Probable artifact within the non dominant left jugular bulb and left internal jugular vein. Anatomic variants: As above. Delayed phase: As above IMPRESSION: CT HEAD Remote right frontal lobe infarct with encephalomalacia and subsequent dilation of the right lateral ventricle. Question acute extension of infarct along the  superior margin. CTA HEAD Moderate narrowing right internal carotid artery cavernous segment. Occluded M1 segment of the right middle cerebral artery. This appears to have progressed since the prior MR angiogram (which was significantly motion degraded). Decrease number of visualized right middle cerebral artery branch vessels consistent with remote infarct and possibly acute infarct. Hypoplastic A1 segment right anterior cerebral artery. Mild irregularity and narrowing left internal carotid artery cavernous segment. Mild irregularity and narrowing left middle cerebral artery M1 segment. Left vertebral artery is dominant. Slight irregularity distal vertebral arteries. Narrowed irregular basilar artery. CTA NECK Web-like narrowing proximal right internal carotid artery with less than 50% diameter stenosis. Ectatic distal left common carotid artery extends posterior and medial to the hyoid with hyoid compressing the anterior lateral aspect of the distal left common carotid artery with subsequent mild to slight moderate narrowing. No significant stenosis of the left carotid bifurcation. Left vertebral artery is dominant. Slight narrowing proximal left vertebral artery. Mild irregularity of the vertical segment of vertebral arteries bilaterally without high-grade stenosis. Scattered normal to top normal size lymph nodes more notable on the right. There may be coronary artery calcifications. Electronically Signed By: Genia Del M.D. On: 06/11/2015 15:39   Ct Head Wo Contrast  06/12/2015 CLINICAL DATA: 49 year old female code stroke patient with slurred speech and left side weakness. Frontal headache. Chronic right MCA vessel disease in infarct. Initial encounter. EXAM: CT HEAD WITHOUT CONTRAST TECHNIQUE: Contiguous axial images were obtained from the base of the skull through the vertex without intravenous contrast. COMPARISON: CTA head and neck 06/11/2015, and earlier. FINDINGS: Visualized paranasal sinuses  and mastoids are clear. No acute osseous abnormality identified. Rightward gaze deviation. No acute  scalp or orbits soft tissue finding. Chronic right insula and operculum encephalomalacia. This appears stable since 2014. No superimposed acute right hemisphere cortically based infarct is identified. No acute intracranial hemorrhage identified. No midline shift, mass effect, or evidence of intracranial mass lesion. Stable ventricle size and configuration. Stable and normal left hemisphere gray-white matter differentiation. IMPRESSION: Stable non contrast CT appearance of the brain, chronic right MCA infarct. No acute cortically based infarct or intracranial hemorrhage identified. Electronically Signed By: Genevie Ann M.D. On: 06/12/2015 13:38   Ct Head Wo Contrast  06/11/2015 CLINICAL DATA: Initial encounter for unresponsiveness. Code stroke. EXAM: CT HEAD WITHOUT CONTRAST TECHNIQUE: Contiguous axial images were obtained from the base of the skull through the vertex without intravenous contrast. COMPARISON: 06/09/2013. FINDINGS: Imaging planes are oblique. Old posterior right frontal infarct is unchanged in the interval. No evidence for acute hemorrhage, hydrocephalus, mass lesion, or abnormal extra-axial fluid collection on today's exam. No CT evidence for acute ischemia. The visualized paranasal sinuses and mastoid air cells are clear. IMPRESSION: No acute intracranial abnormality. Specifically, no CT features of acute ischemia on the current study. No evidence for acute hemorrhage. Old posterior right frontal infarct. Findings discussed with Dr. Leonel Ramsay at approximately 1400 hours on 06/11/2015. Electronically Signed By: Misty Stanley M.D. On: 06/11/2015 14:13   Ct Angio Neck W/cm &/or Wo/cm  06/11/2015 CLINICAL DATA: 49 year old hypertensive female with hyperlipidemia and history of prior stroke presenting with sudden onset of left-sided paralysis and slurred speech. Subsequent encounter.  EXAM: CT ANGIOGRAPHY HEAD AND NECK TECHNIQUE: Multidetector CT imaging of the head and neck was performed using the standard protocol during bolus administration of intravenous contrast. Multiplanar CT image reconstructions and MIPs were obtained to evaluate the vascular anatomy. Carotid stenosis measurements (when applicable) are obtained utilizing NASCET criteria, using the distal internal carotid diameter as the denominator. CONTRAST: 62mL OMNIPAQUE IOHEXOL 350 MG/ML SOLN COMPARISON: 06/11/2015 and 06/09/2013 unenhanced head CT. 06/09/2013 brain MR and MR angiogram. FINDINGS: CT HEAD Brain: Remote right frontal lobe infarct with encephalomalacia and subsequent dilation of the right lateral ventricle. Question acute extension of infarct along the superior margin. No intracranial hemorrhage. No intracranial mass or abnormal enhancement. Calvarium and skull base: Negative. Paranasal sinuses: Clear. Orbits: Negative. Prominent cerumen right ear. CTA NECK Aortic arch: 2 vessel aortic arch with mild ectasia. Pulsation artifact limits evaluation of the ascending thoracic aorta. Right carotid system: Web-like narrowing proximal right internal carotid artery with less than 50% diameter stenosis. Left carotid system: Ectatic left common carotid artery. The distal left common carotid artery extends posterior and medial to the hyoid with hyoid compressing the anterior lateral aspect of the distal left common carotid artery with subsequent mild to slight moderate narrowing. No significant stenosis of the left carotid bifurcation Vertebral arteries:Mild narrowing distal left subclavian artery. Left vertebral artery is dominant. Slight narrowing proximal left vertebral artery. Mild irregularity of the vertical segment of vertebral arteries bilaterally without high-grade stenosis. Skeleton: Mild cervical spondylotic changes most prominent C5-6. Other neck: No worrisome primary neck mass. Scattered normal to top normal size  lymph nodes more notable on the right. No worrisome lung apical lesion. There may be a coronary artery calcifications. CTA HEAD Anterior circulation: Moderate narrowing right internal carotid artery cavernous segment. Fetal type contribution to the right posterior cerebral artery. Occluded M1 segment of the right middle cerebral artery. This appears to have progressed since the prior MR angiogram (which was significantly motion degraded). Decrease number of visualized right middle cerebral artery branch vessels consistent  with remote infarct and possibly acute infarct. Hypoplastic A1 segment right anterior cerebral artery. Mild irregularity and narrowing left internal carotid artery cavernous segment. Mild irregularity and narrowing left middle cerebral artery M1 segment. Posterior circulation: Left vertebral artery is dominant. Slight irregularity distal vertebral arteries. Narrowed irregular basilar artery. Venous sinuses: Patent. Probable artifact within the non dominant left jugular bulb and left internal jugular vein. Anatomic variants: As above. Delayed phase: As above IMPRESSION: CT HEAD Remote right frontal lobe infarct with encephalomalacia and subsequent dilation of the right lateral ventricle. Question acute extension of infarct along the superior margin. CTA HEAD Moderate narrowing right internal carotid artery cavernous segment. Occluded M1 segment of the right middle cerebral artery. This appears to have progressed since the prior MR angiogram (which was significantly motion degraded). Decrease number of visualized right middle cerebral artery branch vessels consistent with remote infarct and possibly acute infarct. Hypoplastic A1 segment right anterior cerebral artery. Mild irregularity and narrowing left internal carotid artery cavernous segment. Mild irregularity and narrowing left middle cerebral artery M1 segment. Left vertebral artery is dominant. Slight irregularity distal vertebral arteries.  Narrowed irregular basilar artery. CTA NECK Web-like narrowing proximal right internal carotid artery with less than 50% diameter stenosis. Ectatic distal left common carotid artery extends posterior and medial to the hyoid with hyoid compressing the anterior lateral aspect of the distal left common carotid artery with subsequent mild to slight moderate narrowing. No significant stenosis of the left carotid bifurcation. Left vertebral artery is dominant. Slight narrowing proximal left vertebral artery. Mild irregularity of the vertical segment of vertebral arteries bilaterally without high-grade stenosis. Scattered normal to top normal size lymph nodes more notable on the right. There may be coronary artery calcifications. Electronically Signed By: Genia Del M.D. On: 06/11/2015 15:39     Assessment/Plan: Diagnosis: Right MCA infarct with left hemiparesis and left neglect 1. Does the need for close, 24 hr/day medical supervision in concert with the patient's rehab needs make it unreasonable for this patient to be served in a less intensive setting? Yes 2. Co-Morbidities requiring supervision/potential complications: Prior right subcortical infarct, PFO, hypertension, asthma 3. Due to bladder management, bowel management, safety, skin/wound care, disease management, medication administration, pain management and patient education, does the patient require 24 hr/day rehab nursing? Yes 4. Does the patient require coordinated care of a physician, rehab nurse, PT (1-2 hrs/day, 5 days/week), OT (1-2 hrs/day, 5 days/week) and SLP (0.5-1 hrs/day, 5 days/week) to address physical and functional deficits in the context of the above medical diagnosis(es)? Yes Addressing deficits in the following areas: balance, endurance, locomotion, strength, transferring, bowel/bladder control, bathing, dressing, feeding, grooming, toileting, cognition, swallowing and psychosocial support 5. Can the patient actively  participate in an intensive therapy program of at least 3 hrs of therapy per day at least 5 days per week? Yes 6. The potential for patient to make measurable gains while on inpatient rehab is excellent 7. Anticipated functional outcomes upon discharge from inpatient rehab are supervision and min assist with PT, supervision and min assist with OT, supervision with SLP. 8. Estimated rehab length of stay to reach the above functional goals is: 18-21 days 9. Does the patient have adequate social supports and living environment to accommodate these discharge functional goals? Yes 10. Anticipated D/C setting: Home 11. Anticipated post D/C treatments: [facility] therapy 12. Overall Rehab/Functional Prognosis: excellent  RECOMMENDATIONS: This patient's condition is appropriate for continued rehabilitative care in the following setting: CIR Patient has agreed to participate in recommended program.  Yes Note that insurance prior authorization may be required for reimbursement for recommended care.  Comment: Needs PT and OT evaluations    06/13/2015       Revision History     Date/Time User Provider Type Action   06/13/2015 12:03 PM [name], MD Physician Sign   06/13/2015 7:03 AM Cathlyn Parsons, PA-C Physician Assistant P[place]   View Details Report       Routing History     Date/Time From To Method   06/13/2015 12:03 PM [name], MD [name], MD In Basket   06/13/2015 12:03 PM [name], MD Marletta Lor, MD In Basket

## 2015-06-16 ENCOUNTER — Inpatient Hospital Stay (HOSPITAL_COMMUNITY): Payer: Medicaid Other | Admitting: Speech Pathology

## 2015-06-16 ENCOUNTER — Inpatient Hospital Stay (HOSPITAL_COMMUNITY): Payer: Medicaid Other | Admitting: Physical Therapy

## 2015-06-16 ENCOUNTER — Inpatient Hospital Stay (HOSPITAL_COMMUNITY): Payer: Medicaid Other | Admitting: Occupational Therapy

## 2015-06-16 DIAGNOSIS — G8194 Hemiplegia, unspecified affecting left nondominant side: Secondary | ICD-10-CM

## 2015-06-16 DIAGNOSIS — R414 Neurologic neglect syndrome: Secondary | ICD-10-CM

## 2015-06-16 LAB — OCCULT BLOOD X 1 CARD TO LAB, STOOL: Fecal Occult Bld: POSITIVE — AB

## 2015-06-16 NOTE — Progress Notes (Signed)
   Subjective/Complaints:no specific complaints except decreased BMs  Objective: Vital Signs: Blood pressure 138/70, pulse 64, temperature 98.7 F (37.1 C), temperature source Oral, resp. rate 16, height 5\' 4"  (1.626 m), weight 180 lb 1.6 oz (81.693 kg), last menstrual period 05/14/2015, SpO2 95 %.  Nad, chest,- cta cv- reg rate abd- soft, not Ext- no edema  Assessment/Plan: 1. Functional deficits secondary to Right MCA infarct with L Hemiparesis and Left hemineglect  Medical Problem List and Plan: 1. Functional deficits secondary to right MCA infarct with history of prior CVA 2008 as well as 2014 2. DVT Prophylaxis/Anticoagulation: SCDs. Venous Doppler is negative 3. Pain Management: Tylenol as needed 4. Dysphagia. Dysphagia #2 thin liquid diet. Monitor for any signs of aspiration. Follow-up speech therapy 5. Neuropsych: This patient is capable of making decisions on her own behalf. 6. Skin/Wound Care: Routine skin checks 7. Fluids/Electrolytes/Nutrition: Routine high nose with follow-up chemistries 8. Hyperlipidemia. Continue Pravachol 9. Hypertension. [id number]/[id number]/[id number] 10. History of asthma. Patient on albuterol inhaler as needed prior to admission, occ wheezing noted on exam 11.  Hypokalemia  Lab Results  Component Value Date   K 3.2* 06/15/2015  supplemental K  12.  Mild Anemia- menstrual period currently check stool guaic,  Fe studies Lab Results  Component Value Date   HGB 10.4* 06/15/2015   13.- BMs- she is going to address with [facility] today- if no results, will start laxative LOS (Days) 2 A FACE TO [place] 06/16/2015, 6:14 AM

## 2015-06-16 NOTE — Progress Notes (Signed)
Speech Language Pathology Daily Session Note  Patient Details  Name: Rebecca Yoder MRN: [id number] Date of Birth: 04/03/1966  Today's Date: 06/16/2015 SLP Individual Time: 0815-0900 SLP Individual Time Calculation (min): 45 min  Short Term Goals: Week 1: SLP Short Term Goal 1 (Week 1): Pt will utilize increased vocal intensity, pacing, and overarticulation to achieve intelligibility in sentences with min assist verbal cues.  SLP Short Term Goal 2 (Week 1): Pt will return demonstration of at least 2 safety precautions during functional tasks over 3 targeted sessions with min assist verbal and visual cues.  SLP Short Term Goal 3 (Week 1): Pt will consume dys 2 textures and thin liquids with supervision cues to monitor and correct left sided buccal residue over 2 targeted sessions prior to diet advancement.   SLP Short Term Goal 4 (Week 1): Pt will consume advanced solids with supervision cues to monitor and correct left sided buccal residue over 2 targeted session prior to diet advancement..    Skilled Therapeutic Interventions:  Pt was seen for skilled ST targeting goals for communication and dysphagia.  Upon arrival, pt was seated upright in wheelchair, awake, alert, and agreeable to participate in [place].  SLP provided skilled education regarding compensatory dysarthria strategies, emphasizing loud voice and pacing for slow rate to achieve intelligibility in sentences and conversations.  Pt then utilized the abovementioned strategies with overall min assist verbal cues during a structured barrier task targeting distinct production between minimal pairs for ~80% intelligibility.   SLP also facilitated the session with trials of upgraded textures to continue working towards diet advancement.  Pt consumed solid cracker consistencies with prolonged but effective mastication of solids.  She was able to clear residual solids with supervision cues for use of lingual sweep and liquid wash.  Pt recalled  rationale for use of quick release belt at the end of today's session and requested SLP replace belt prior to the end of the therapy session.  Pt left upright in wheelchair with quick release belt donned and all needs left within reach.  Continue per current plan of care.    Function:  Eating Eating   Modified Consistency Diet: Yes Eating Assist Level: Supervision or verbal cues           Cognition Comprehension Comprehension assist level: Follows basic conversation/direction with no assist  Expression   Expression assist level: Expresses basic 50 - 74% of the time/requires cueing 25 - 49% of the time. Needs to repeat parts of sentences.  Social Interaction Social Interaction assist level: Interacts appropriately 90% of the time - Needs monitoring or encouragement for participation or interaction.  Problem Solving Problem solving assist level: Solves basic 75 - 89% of the time/requires cueing 10 - 24% of the time  Memory Memory assist level: Recognizes or recalls 50 - 74% of the time/requires cueing 25 - 49% of the time    Pain Pain Assessment Pain Assessment: No/denies pain  Therapy/Group: Individual Therapy  Taeshaun Rames, Selinda Orion 06/16/2015, 12:19 PM

## 2015-06-16 NOTE — Progress Notes (Signed)
Physical Therapy Session Note  Patient Details  Name: Rebecca Yoder MRN: [id number] Date of Birth: 1965/12/05  Today's Date: 06/16/2015 PT Individual Time: 0900-1000 Treatment Session 2: 3267-1245 PT Individual Time Calculation (min): 60 min Treatment Session 2: 45 min  Short Term Goals: Week 1:  PT Short Term Goal 1 (Week 1): Pt will initiate stair training during PT session.  PT Short Term Goal 2 (Week 1): Pt will initiate ambulation with HW during PT session.  PT Short Term Goal 3 (Week 1): Pt will demonstrate sit to stand req min A.  PT Short Term Goal 4 (Week 1): Pt will self propel manual w/c x 100' req SBA.  PT Short Term Goal 5 (Week 1): Pt will req mod A for dynamic standing balance activities.   Skilled Therapeutic Interventions/Progress Updates:    Treatment Session 1: Pt received up in w/c - agreeable to PT session. W/C Management - PT obtains L brake extender for improved ease for pt to self lock & unlock L brake. PT instructs pt to use R UE to assist in placing L hand on brake (at thenar eminence), then using trunk weight shift forward & backward as active assist to lock & unlock L brake, respectively - pt demonstrates understanding with cues. See function tab for w/c propulsion details. Pt uses R hemi technique and needs frequent, repeated cues for coordination R UE with R LE (for power & steering). Gait Training - PT instructs pt in ambulation first x 30' at wall rail, then x 7' + 23' with R HW req max A from PT and +2 for w/c follow for safety. Pt continues to demonstrate slight impulsiveness and decreased safety awareness, but is able to self progress L LE, req assist for adequate placement from PT during swing, as well as blocking L knee from buckling/hyperextension in L stance. Neuromuscular Reeducation - Trunk Impairment Scale given - see results below. Therapeutic Activity - See function tab for details - pt practices squat-pivot transfers w/c to/from mat with armrest req  max A, focus on head-hips relationship. Pt requests to toilet at end of session - see function tab for details. Pt ended with nurse tech using [place] to finish toileting pt. Pt is progressing - continue per PT POC.   Treatment Session 2: Pt received up in w/c with visitors present, who left immediately after PT started. Therapeutic Activity - see function tab for bed mobility transfers. Neuromusuclar Reeducation - PT instructs pt in L side NMR activities: bridging, hip adduction with PT resisting R side, hip ER in B hook lie with light orange REP band around knees, abdominal crunches in B hook lie, PNF D1 flexion/extension pattern with L UE with AAROM, SAQ with PT preventing pt from flexing hip up (compensatory pattern): 2 x 10 reps each. Pt ended up in w/c with quick release belt in place and all needs in reach. PT administered a stress ball to work out hemiplegic hand. Pt is progressing with L side motor return and functional mobility - but continues to remain very impaired. Continue per PT POC.    Therapy Documentation Precautions:  Precautions Precautions: Fall Precaution Comments: left hemiparesis Restrictions Weight Bearing Restrictions: No Pain: Pain Assessment Pain Assessment: No/denies pain  Treatment Session 2:  Pt c/o L wrist pain with PNF pattern  - unrated - PT uses rest to decrease pt's pain.   Balance:   Trunk Impairment Scale (TIS) The starting position for each item is the same. The patient is sitting  on the edge of a bed or treatment table without back and arm support. The thighs make full contact with the bed or table; the feet are hip width apart and placed flat on the floor. The knee angle is 90. The arms rest on the legs. If hypertonia is present the position of the hemiplegic arm is taken as the starting position. The head and trunk are in a midline position. If the patient scores 0 on the first item, the total score for the TIS is 0.  Each item of the test can be performed  three times. The highest score counts. No practice session is allowed. The patient can be corrected between the attempts.  The tests are verbally explained to the patient and can be demonstrated if needed.   Static sitting balance 1. Starting position  Patient falls or cannot maintain starting position for 10 seconds without arm support  Patient can maintain starting position for 10 seconds  If score= 0, then TIS total score=0  0    2  SCORE      2  2. Starting position Therapist crosses the unaffected leg over the hemiplegic leg  Patient falls or cannot maintain sitting position for 10 seconds without arm support  Patient can maintain sitting position for 10 seconds       0   2       2  3. Starting position Patient crosses the unaffected leg over the hemiplegic leg   Patient falls Patient cannot cross the legs without arm support on bed or table Patient crosses the legs but displaces the trunk more than 10 cm backwards or assists crossing with the hand Patient crosses the legs without trunk displacement or assistance      0  1   2    3        2    Total static sitting balance   _6____/7   Dynamic sitting balance     1. Starting position Patient is instructed to touch the bed or table with the hemiplegic elbow (by shortening the hemiplegic side and lengthening the unaffected side) and return to the starting position  Patient falls, needs support from an upper extremity or the elbow does not touch the bed or table Patient moves actively without help, elbow touches bed or table  If score= 0, then items 2 and 3 score 0    0  1        0  2. Repeat item 1  Patient demonstrates no or opposite shortening/lengthening Patient demonstrates appropriate shortening/lengthening  If score= 0, then item 3 scores 0 0  1  0  3. Repeat item 1 Patient compensates. Possible compensations are: (1) use of upper extremity, (2) contralateral hip  abduction, (3) hip flexion (if elbow touches bed or table further then proximal half of femur), (4) knee flexion, (5) sliding of the feet  Patient moves without compensation    0       1  0 - pt falls  4. Starting position Patient is instructed to touch the bed or table with the unaffected elbow (by shortening the unaffected side and lengthening the hemiplegic side) and return to the starting position  Patient falls, needs support from an upper extremity or the elbow does not touch the bed or table Patient moves actively without help, elbow touches bed or table  If score= 0, then items 5 and 6 score 0     0   1  0  5. Repeat item 4  Patient demonstrates no or opposite shortening/lengthening Patient demonstrates appropriate shortening/lengthening  If score= 0, then item 6 scores 0 0   1     1  6. Repeat item 4 Patient compensates. Possible compensations are: (1) use of upper  extremity, (2) contralateral hip abduction, (3) hip flexion (if elbow touches bed or table further then proximal half of femur), (4) knee flexion, (5) sliding of the feet  Patient moves without compensation    0          1  0  7. Starting position Patient is instructed to lift pelvis from bed or table at the hemiplegic side (by shortening the hemiplegic side and lengthening the unaffected side) and return to the starting position  Patient demonstrates no or opposite shortening/lengthening  Patient demonstrates appropriate shortening/lengthening If score= 0, then item 8 scores 0     0   1      0  8. Repeat item 7 Patient compensates. Possible compensations are: (1) use of upper extremity, (2) pushing off with the ipsilateral foot (heel loses Contact with the floor) Patient moves without compensation   0   1 0 - hip flexion compensation  9. Starting position Patient is instructed to lift pelvis from bed or table at the Unaffected side (by shortening the  unaffected side and lengthening the hemiplegic side) and return to the starting position  Patient demonstrates no or opposite shortening/lengthening Patient demonstrates appropriate shortening/lengthening   If score= 0, then item 10 scores 0      0  1     0  10. Repeat item 9 Patient compensates. Possible compensations are: (1) use of upper extremities, (2) pushing off with the ipsilateral foot (heel loses contact with the floor) Patient moves without compensation   0   1     0   Total dynamic sitting balance __1____/10   Coordination    1. Starting position Patient is instructed to rotate upper trunk 6 times (every shoulder should be moved forward 3 times), first side that moves must be hemiplegic side, head should be fixated in starting position  Hemiplegic side is not moved three times  Rotation is asymmetrical Rotation is symmetrical  If score= 0, then item 2 scores 0      0 1 2        1   2. Repeat item 1 within 6 seconds Rotation is asymmetrical Rotation is symmetrical   0 1    0  3. Starting position Patient is instructed to rotate lower trunk 6 times (every knee should be moved forward 3 times), first side that moves must be hemiplegic side, upper trunk should be fixated in starting position  Hemiplegic side is not moved three times Rotation is asymmetrical Rotation is symmetrical If score= 0, then item 4 scores 0      0 1 2    1  = asymmetrical   Total coordination __3____/6   Total Trunk Impairment Scale score  ___10_____/23  Trunk Impairment Scale:  As indicated by a Trunk Impairment Score of 10 /23 the patient presents with impairments in static sitting balance, dynamic sitting balance and/or trunk coordination.  Specifically the patient has difficulty with lengthening/shortening/rotation of the L & R trunk and compensates with:  Therapy to address the above impairments in order to improve the patient's functional  independence with the following tasks: safety with supine to sit bed mobility, preparing to stand from eob, dressing while  eob, and completing ADLs at w/c level.       See Function Navigator for Current Functional Status.   Therapy/Group: Individual Therapy with [facility], PT Tech as +2 in AM  [facility] M 06/16/2015, 9:25 AM

## 2015-06-16 NOTE — Progress Notes (Signed)
Occupational Therapy Session Note  Patient Details  Name: Rebecca Yoder MRN: [id number] Date of Birth: 28-Oct-1965  Today's Date: 06/16/2015 OT Individual Time: 1010-1110 OT Individual Time Calculation (min): 60 min    Short Term Goals: Week 1:  OT Short Term Goal 1 (Week 1): Pt will transfer to shower bench with steady  A squat pivot transfer. OT Short Term Goal 2 (Week 1): Pt will bathe in shower with steady A using long handled sponge. OT Short Term Goal 3 (Week 1): Pt will don shirt with min A    OT Short Term Goal 4 (Week 1): Pt will don pants with min A to steady her balance.   OT Short Term Goal 5 (Week 1): Pt will be able to maintain static standing balance with min A.  Skilled Therapeutic Interventions/Progress Updates:   Patient seen for ADL retraining with sister present for ADL retraining and caregiver education.  Session focused on functional mobility, ADL retraining with hemibathing/hemidressing techniques, standing balance, standing tolerance, activity tolerance, postural control, flexibility, range of motion, problem solving, and overall endurance.   Patient grooming at sink to brush teeth upon therapist arrival.  Patient requires verbal cues for completion of task; perseverated on rinsing mouth.  Patient gathers clothing with supervision.  Transfers wheelchair to TTB in shower via max assist squat pivot.  Patient bathes with minimum assistance for RUE; educated on hemibathing techniques to wash RUE.  Also educated patient and sister on use of washcloth on B thighs to assist with stabilizing legs across opposite knees.  Patient's sister educated on use of long handled sponge to decrease fall risk with sitting balance and forward trunk flexion.  Sit <> stand min to mod assist to dry peri area/buttocks with use of grab bar.  Dresses seated edge of TTB after shower.  UB dress min assist for threading LUE through clothing with hemidressing techniques.  LB dress with moderate to  minimum assistance for balance (seated/standing).  Overall requires assist for dynamic sitting balance throughout ADL bathing/dressing techniques.  Able to cross B legs over opposite knees with min assist to stabilize.  Transfers edge of TTB to wheelchair via stand pivot mod assist and max verbal cues for technique.  Patient transfers wheelchair to bed via moderate to max assist stand pivot.  EOB to supine mod assist for BLE management onto bed.  Repositioned with assist from sister; max assist.  Patient assisted with RUE and RLE.  Call bell and bed alarm on at end of session.  Sister receptive and encouraged patient's independence throughout.  Good tolerance and ability today.    Therapy Documentation Precautions:  Precautions Precautions: Fall Precaution Comments: left hemiparesis Restrictions Weight Bearing Restrictions: No Pain: Pain Assessment Pain Assessment: No/denies pain ADL: ADL ADL Comments: refer to functional navigator  See Function Navigator for Current Functional Status.   Therapy/Group: Individual Therapy  Osa Craver 06/16/2015, 12:37 PM

## 2015-06-17 ENCOUNTER — Inpatient Hospital Stay (HOSPITAL_COMMUNITY): Payer: Medicaid Other | Admitting: *Deleted

## 2015-06-17 LAB — OCCULT BLOOD X 1 CARD TO LAB, STOOL: FECAL OCCULT BLD: NEGATIVE

## 2015-06-17 NOTE — Progress Notes (Signed)
Physical Therapy Session Note  Patient Details  Name: Rebecca Yoder MRN: [id number] Date of Birth: Dec 26, 1965  Today's Date: 06/17/2015 PT Individual Time: 1300-1400 PT Individual Time Calculation (min): 60 min    Skilled Therapeutic Interventions/Progress Updates:  Patient in room ,sitting in w/c agrees to therapy session and no complains of pain or discomfort. W/c propulsion to and from therapy gym with hemi technique with Supervision and cues to manage w/c parts.  Gait training with max A of 1 and close w/c follow 2 x 25 feet with manual L knee locking during stance and weight bearing , assistance with l LE placement in swing. Patient holds to R side rail and L arm around therapist.   Transfers to mat from w/c vis scooting min A.  In supine exercises: Hip abd, bridging, SLR, resisted hip knee flexion extension combination with ER adn IR component. NMR to L UEwith PNF patterns to facilitate functional mobility. In sitting lateral lumbar flexors training.  Sit to stand with no UE support and mini squats with mod to max A from therapist needed to reduce strong L side leaning and inattention/neglect.  Patient returned to room, left in w/c with QR belt on and family present.   Therapy Documentation Precautions:  Precautions Precautions: Fall Precaution Comments: left hemiparesis Restrictions Weight Bearing Restrictions: No Vital Signs: Therapy Vitals Temp: 98.2 F (36.8 C) Temp Source: Oral Pulse Rate: (!) 108 Resp: 18 BP: 136/75 mmHg Patient Position (if appropriate): Sitting Oxygen Therapy SpO2: 97 % O2 Device: Not Delivered   See Function Navigator for Current Functional Status.   Therapy/Group: Individual Therapy  [place] Spanish 06/17/2015, 2:07 PM

## 2015-06-17 NOTE — Progress Notes (Signed)
   Subjective/Complaints:sleeping, easily awakens BM yesterday  Objective: Vital Signs: Blood pressure 121/58, pulse 63, temperature 99.1 F (37.3 C), temperature source Oral, resp. rate 18, height 5' 4.5" (1.638 m), weight 180 lb 1.6 oz (81.693 kg), last menstrual period 05/14/2015, SpO2 96 %.  Nad, chest,- cta cv- reg rate abd- soft, not Ext- no edema  Assessment/Plan: 1. Functional deficits secondary to Right MCA infarct with L Hemiparesis and Left hemineglect  Medical Problem List and Plan: 1. Functional deficits secondary to right MCA infarct with history of prior CVA 2008 as well as 2014 2. DVT Prophylaxis/Anticoagulation: SCDs. Venous Doppler is negative 3. Pain Management: Tylenol as needed 4. Dysphagia. Dysphagia #2 thin liquid diet. Monitor for any signs of aspiration. Follow-up speech therapy 5. Neuropsych: This patient is capable of making decisions on her own behalf. 6. Skin/Wound Care: Routine skin checks 7. Fluids/Electrolytes/Nutrition: Routine high nose with follow-up chemistries 8. Hyperlipidemia. Continue Pravachol 9. Hypertension. Reasonably controlled 10. History of asthma. Patient on albuterol inhaler as needed prior to admission, occ wheezing noted on exam 11.  Hypokalemia  Lab Results  Component Value Date   K 3.2* 06/15/2015  supplemental K  12.  Mild Anemia- menstrual period currently check stool guaic,  Fe studies Lab Results  Component Value Date   HGB 10.4* 06/15/2015   13.- BMs- had BM yesterday LOS (Days) 3 A FACE TO [place] 06/17/2015, 7:37 AM

## 2015-06-18 ENCOUNTER — Inpatient Hospital Stay (HOSPITAL_COMMUNITY): Payer: Medicaid Other

## 2015-06-18 ENCOUNTER — Encounter (HOSPITAL_COMMUNITY): Payer: PRIVATE HEALTH INSURANCE

## 2015-06-18 ENCOUNTER — Inpatient Hospital Stay (HOSPITAL_COMMUNITY): Payer: Medicaid Other | Admitting: Physical Therapy

## 2015-06-18 ENCOUNTER — Inpatient Hospital Stay (HOSPITAL_COMMUNITY): Payer: Medicaid Other | Admitting: Speech Pathology

## 2015-06-18 MED ORDER — PANTOPRAZOLE SODIUM 40 MG PO TBEC
40.0000 mg | DELAYED_RELEASE_TABLET | Freq: Every day | ORAL | Status: DC
  Administered 2015-06-19 – 2015-07-04 (×16): 40 mg via ORAL
  Filled 2015-06-18 (×16): qty 1

## 2015-06-18 NOTE — Progress Notes (Signed)
Occupational Therapy Session Note  Patient Details  Name: Rebecca Yoder MRN: [id number] Date of Birth: 02-17-1966  Today's Date: 06/18/2015 OT Individual Time: 3559-7416 OT Individual Time Calculation (min): 60 min    Short Term Goals: Week 1:  OT Short Term Goal 1 (Week 1): Pt will transfer to shower bench with steady  A squat pivot transfer. OT Short Term Goal 2 (Week 1): Pt will bathe in shower with steady A using long handled sponge. OT Short Term Goal 3 (Week 1): Pt will don shirt with min A    OT Short Term Goal 4 (Week 1): Pt will don pants with min A to steady her balance.   OT Short Term Goal 5 (Week 1): Pt will be able to maintain static standing balance with min A.  Skilled Therapeutic Interventions/Progress Updates: ADL-retraining with focus on weight-shifting during squat pivot transfers, safety awareness, memory, static standing balance, NMR of LUE and sequencing.   Pt completes transfer from bed to w/c and w/c to tub bench with mod assist, manual facilitation to initiate and mod vc to sequence.   Pt bathes with overall mod assist sitting on tub bench but completes sit<>stand, sustaining static standing balance for 20 seconds as therapist washes buttocks.   Pt demo's finger extension and primitive grasp/release during functional task but lacks shoulder/elbow flex/exten to place hand during activities.   Pt left in w/c at end of session with call light within reach.      Therapy Documentation Precautions:  Precautions Precautions: Fall Precaution Comments: left hemiparesis Restrictions Weight Bearing Restrictions: No   Vital Signs: Therapy Vitals Temp: 98.4 F (36.9 C) Temp Source: Oral Pulse Rate: 75 Resp: 18 BP: 118/78 mmHg Patient Position (if appropriate): Lying Oxygen Therapy SpO2: 100 % O2 Device: Not Delivered   Pain: No/denies pain   ADL: ADL ADL Comments: refer to functional navigator  See Function Navigator for Current Functional  Status.   Therapy/Group: Individual Therapy   Second session: Time: 1400-1430 Time Calculation (min): 30 min  Pain Assessment: No/denies pain  Skilled Therapeutic Interventions: NMR with focus on sensory/motor integration of LUE.   Pt performs reaching (shoulder flex/ext),  internal/external rotation, elbow flex/ext with OT facilitation using UE-[place] and hand-over hand guidance to increase AROM.   Pt then completes 3 of 7 SROM, bilateral UE exercise with fingers interlaced as directed.   See FIM for current functional status  Therapy/Group: Individual Therapy  Halsey Persaud 06/18/2015, 9:55 AM

## 2015-06-18 NOTE — Progress Notes (Signed)
Speech Language Pathology Daily Session Note  Patient Details  Name: SEANNA SISLER MRN: [id number] Date of Birth: 07-15-1966  Today's Date: 06/18/2015 SLP Individual Time: 1002-1100 SLP Individual Time Calculation (min): 58 min  Short Term Goals: Week 1: SLP Short Term Goal 1 (Week 1): Pt will utilize increased vocal intensity, pacing, and overarticulation to achieve intelligibility in sentences with min assist verbal cues.  SLP Short Term Goal 2 (Week 1): Pt will return demonstration of at least 2 safety precautions during functional tasks over 3 targeted sessions with min assist verbal and visual cues.  SLP Short Term Goal 3 (Week 1): Pt will consume dys 2 textures and thin liquids with supervision cues to monitor and correct left sided buccal residue over 2 targeted sessions prior to diet advancement.   SLP Short Term Goal 4 (Week 1): Pt will consume advanced solids with supervision cues to monitor and correct left sided buccal residue over 2 targeted session prior to diet advancement..    Skilled Therapeutic Interventions:  Pt was seen for skilled ST targeting dysarthria goals.  SLP facilitated the session with a semi-complex descriptive naming task in a moderately distracting environment to address carryover and use of compensatory dysarthria strategies. Pt recalled 2 out of 2 targeted strategies with supervision question cues.  Pt then utilized strategies during the abovementioned task with overall min assist verbal cues for use of increased vocal intensity and slow rate of speech to achieve intelligibility at the phrase/sentence level.  Pt was returned to room and left upright in wheelchair with quick release belt donned and all needs left within reach.  Continue per current plan of care.    Function:  Eating Eating   Modified Consistency Diet: Yes Eating Assist Level: Supervision or verbal cues           Cognition Comprehension Comprehension assist level: Follows basic  conversation/direction with no assist  Expression   Expression assist level: Expresses basic 50 - 74% of the time/requires cueing 25 - 49% of the time. Needs to repeat parts of sentences.  Social Interaction Social Interaction assist level: Interacts appropriately 90% of the time - Needs monitoring or encouragement for participation or interaction.  Problem Solving Problem solving assist level: Solves basic 75 - 89% of the time/requires cueing 10 - 24% of the time  Memory Memory assist level: Recognizes or recalls 50 - 74% of the time/requires cueing 25 - 49% of the time    Pain Pain Assessment Pain Assessment: No/denies pain  Therapy/Group: Individual Therapy  Aryannah Mohon, Selinda Orion 06/18/2015, 11:27 AM

## 2015-06-18 NOTE — Progress Notes (Signed)
Physical Therapy Session Note  Patient Details  Name: Rebecca Yoder MRN: [id number] Date of Birth: 1965/12/24  Today's Date: 06/18/2015 PT Individual Time: 1100-1200 PT Individual Time Calculation (min): 60 min   Short Term Goals: Week 1:  PT Short Term Goal 1 (Week 1): Pt will initiate stair training during PT session.  PT Short Term Goal 2 (Week 1): Pt will initiate ambulation with HW during PT session.  PT Short Term Goal 3 (Week 1): Pt will demonstrate sit to stand req min A.  PT Short Term Goal 4 (Week 1): Pt will self propel manual w/c x 100' req SBA.  PT Short Term Goal 5 (Week 1): Pt will req mod A for dynamic standing balance activities.   Skilled Therapeutic Interventions/Progress Updates:    Pt received seated in w/c with handoff from SLP after previous session; no c/o pain and agreeable to treatment. W/c propulsion with R hemi technique 2x 150' at beginning/end of session to therapy gym. In simulated apartment performed squat pivot transfer w/c <>bed with min/modA, verbal cues for sequencing, safety and w/c setup prior to transfer, and for safety with LUE due to inattention. Transfer squat pivot w/c <>couch with mod/maxA due to compliant surface at lower height than w/c and pt impulsivity. Cueing as above for safety awareness, sequencing. Gait with R rail in hallway 2 x20' with w/c follow and modA. Initial trial without AFO, second trial with L blue rocker AFO to A with stance control and quad contraction with significant improvement noted. Standing balance activity with pt performing repetitive sit <>stand from mat table and dynamic RUE reaching while therapist facilitated L weight bearing and stance control with cueing to glutes/quads. Several LOB inconsistently posterior and to R/L side requiring mod/maxA and occasional assist to sitting position. Pt returned to room with w/c propulsion as described above, remained seated in w/c with quick release belt intact and all needs within  reach at completion of session.   Therapy Documentation Precautions:  Precautions Precautions: Fall Precaution Comments: left hemiparesis Restrictions Weight Bearing Restrictions: No Pain: Pain Assessment Pain Assessment: No/denies pain Pain Score: 0-No pain   See Function Navigator for Current Functional Status.   Therapy/Group: Individual Therapy  Luberta Mutter 06/18/2015, 12:15 PM

## 2015-06-18 NOTE — Progress Notes (Signed)
Patient ID: Rebecca Yoder, female   DOB: 07/18/1966, 49 y.o.   MRN: [id number]   Subjective/Complaints:   Objective: Vital Signs: Blood pressure 118/78, pulse 75, temperature 98.4 F (36.9 C), temperature source Oral, resp. rate 18, height 5' 4.5" (1.638 m), weight 81.693 kg (180 lb 1.6 oz), last menstrual period 05/14/2015, SpO2 100 %. No results found. Results for orders placed or performed during the hospital encounter of 06/14/15 (from the past 72 hour(s))  Iron and TIBC     Status: Abnormal   Collection Time: 06/15/15  7:32 AM  Result Value Ref Range   Iron 35 28 - 170 ug/dL   TIBC 343 250 - 450 ug/dL   Saturation Ratios 10 (L) 10.4 - 31.8 %   UIBC 308 ug/dL  Occult blood card to lab, stool RN will collect     Status: Abnormal   Collection Time: 06/16/15  1:19 PM  Result Value Ref Range   Fecal Occult Bld POSITIVE (A) NEGATIVE  Occult blood card to lab, stool RN will collect     Status: None   Collection Time: 06/17/15  8:06 AM  Result Value Ref Range   Fecal Occult Bld NEGATIVE NEGATIVE     HEENT: normal Cardio: RRR Resp: CTA B/L and unlabored GI: BS positive and NT, ND Extremity:  No Edema Skin:   Intact Neuro: Alert/Oriented, Abnormal Sensory reduced in Left foot, Abnormal Motor 3- Left delt bi tri grip, 3- L HF, 2- L KE, ankle, Abnormal FMC Ataxic/ dec FMC and Tone  Hypertonia, Tone:  Hypertonia and Inattention Musc/Skel:  Other no pain with UE or LE ROM   Assessment/Plan: 1. Functional deficits secondary to RIght MCA infarct which require 3+ hours per day of interdisciplinary therapy in a comprehensive inpatient rehab setting. Physiatrist is providing close team supervision and 24 hour management of active medical problems listed below. Physiatrist and rehab team continue to assess barriers to discharge/monitor patient progress toward functional and medical goals. FIM: Function - Bathing Position: Shower Body parts bathed by patient: Left arm, Chest, Abdomen,  Front perineal area, Buttocks, Right upper leg, Left upper leg Body parts bathed by helper: Right arm, Right lower leg, Left lower leg, Back Assist Level: Touching or steadying assistance(Pt > 75%)  Function- Upper Body Dressing/Undressing What is the patient wearing?: Pull over shirt/dress Pull over shirt/dress - Perfomed by patient: Thread/unthread right sleeve, Put head through opening, Pull shirt over trunk Pull over shirt/dress - Perfomed by helper: Thread/unthread left sleeve Assist Level: Touching or steadying assistance(Pt > 75%) Function - Lower Body Dressing/Undressing What is the patient wearing?: Pants, Socks, Shoes Position: Wheelchair/chair at sink Pants- Performed by patient: Thread/unthread right pants leg, Thread/unthread left pants leg, Pull pants up/down Pants- Performed by helper:  (standing balance) Non-skid slipper socks- Performed by helper: Don/doff right sock, Don/doff left sock Socks - Performed by patient: Don/doff right sock, Don/doff left sock Socks - Performed by helper:  (sitting balance) Shoes - Performed by patient: Don/doff right shoe, Don/doff left shoe Shoes - Performed by helper: Fasten right, Fasten left Assist for footwear: Partial/moderate assist Assist for lower body dressing: Touching or steadying assistance (Pt > 75%)  Function - Toileting Toileting steps completed by patient: Performs perineal hygiene Toileting steps completed by helper: Adjust clothing prior to toileting, Performs perineal hygiene Toileting Assistive Devices: Grab bar or rail Assist level: Two helpers  Function - Air cabin crew transfer assistive device: Drop arm commode Mechanical lift: Stedy Assist level to toilet: Moderate assist (  Pt 50 - 74%/lift or lower) Assist level from toilet: Moderate assist (Pt 50 - 74%/lift or lower) Assist level to bedside commode (at bedside): Maximal assist (Pt 25 - 49%/lift and lower) Assist level from bedside commode (at  bedside): Maximal assist (Pt 25 - 49%/lift and lower)  Function - Chair/bed transfer Chair/bed transfer method: Squat pivot Chair/bed transfer assist level: Maximal assist (Pt 25 - 49%/lift and lower) Chair/bed transfer assistive device: Armrests Chair/bed transfer details: Manual facilitation for placement, Manual facilitation for weight shifting, Verbal cues for sequencing, Verbal cues for technique, Verbal cues for precautions/safety  Function - Locomotion: Wheelchair Will patient use wheelchair at discharge?: Yes Type: Manual Max wheelchair distance: 150 Assist Level: Supervision or verbal cues Assist Level: Supervision or verbal cues Wheel 150 feet activity did not occur: Safety/medical concerns Assist Level: Supervision or verbal cues Turns around,maneuvers to table,bed, and toilet,negotiates 3% grade,maneuvers on rugs and over doorsills: No Function - Locomotion: Ambulation Assistive device: Rail in hallway, Hand held assist Max distance: 25' +25' Assist level: 2 helpers Assist level: 2 helpers Walk 50 feet with 2 turns activity did not occur: Safety/medical concerns Walk 150 feet activity did not occur: Safety/medical concerns Walk 10 feet on uneven surfaces activity did not occur: Safety/medical concerns  Function - Comprehension Comprehension: Auditory Comprehension assist level: Follows basic conversation/direction with no assist  Function - Expression Expression: Verbal Expression assist level: Expresses basic 50 - 74% of the time/requires cueing 25 - 49% of the time. Needs to repeat parts of sentences.  Function - Social Interaction Social Interaction assist level: Interacts appropriately 90% of the time - Needs monitoring or encouragement for participation or interaction.  Function - Problem Solving Problem solving assist level: Solves basic 75 - 89% of the time/requires cueing 10 - 24% of the time  Function - Memory Memory assist level: Recognizes or recalls 50  - 74% of the time/requires cueing 25 - 49% of the time Patient normally able to recall (first 3 days only): Current season, Location of own room, Staff names and faces, That he or she is in a hospital    Medical Problem List and Plan: 1. Functional deficits secondary to right MCA infarct with history of prior CVA 2008 as well as 2014 2.  DVT Prophylaxis/Anticoagulation: SCDs. Venous Doppler is negative 3. Pain Management: Tylenol as needed 4. Dysphagia. Dysphagia #2 thin liquid diet. Monitor for any signs of aspiration. Follow-up speech therapy 5. Neuropsych: This patient is capable of making decisions on her own behalf. 6. Skin/Wound Care: Routine skin checks 7. Fluids/Electrolytes/Nutrition: Routine high nose with follow-up chemistries 8. Hyperlipidemia. Continue Pravachol 9. Hypertension. Norvasc 2.5 mg daily, Cozaar 100 mg daily, HCTZ 12.5 mg daily. Monitor with increased mobility, controlled at 118/74 11/7 10. History of asthma. Patient on albuterol inhaler as needed prior to admission 11.  Anemia- Fe studies ok, one neg and one pos stool guaic but this was during menstruation, monitor  LOS (Days) 4 A FACE TO FACE EVALUATION WAS PERFORMED  KIRSTEINS,ANDREW E 06/18/2015, 6:47 AM

## 2015-06-19 ENCOUNTER — Inpatient Hospital Stay (HOSPITAL_COMMUNITY): Payer: Medicaid Other | Admitting: Physical Therapy

## 2015-06-19 ENCOUNTER — Inpatient Hospital Stay (HOSPITAL_COMMUNITY): Payer: Medicaid Other

## 2015-06-19 ENCOUNTER — Inpatient Hospital Stay (HOSPITAL_COMMUNITY): Payer: Self-pay | Admitting: Physical Therapy

## 2015-06-19 ENCOUNTER — Ambulatory Visit (HOSPITAL_COMMUNITY): Payer: Self-pay | Admitting: Speech Pathology

## 2015-06-19 NOTE — Progress Notes (Addendum)
Physical Therapy Session Note  Patient Details  Name: Rebecca Yoder MRN: [id number] Date of Birth: 14-Dec-1965  Today's Date: 06/19/2015 PT Individual Time: 1640-1710  PT Individual Time Calculation (min): 30 min   Short Term Goals: Week 1:  PT Short Term Goal 1 (Week 1): Pt will initiate stair training during PT session.  PT Short Term Goal 2 (Week 1): Pt will initiate ambulation with HW during PT session.  PT Short Term Goal 3 (Week 1): Pt will demonstrate sit to stand req min A.  PT Short Term Goal 4 (Week 1): Pt will self propel manual w/c x 100' req SBA.  PT Short Term Goal 5 (Week 1): Pt will req mod A for dynamic standing balance activities.   Skilled Therapeutic Interventions/Progress Updates:   Patient's boyfriend Cecilie Lowers present for session. Patient propelled wheelchair via R hemi technique with supervision 2 x 150 ft. Gait using hemi walker with L AFO x 50 ft including 2 turns and backing up to mat table, max A and +2 for wheelchair follow for safety. Patient required manual facilitation for LLE placement due to increased adductor tone and for L knee extension in stance phase, max multimodal cues for RLE extension in stance phase due to tendency to flex R knee when advancing LLE and mod verbal cues for sequencing use of hemiwalker. Patient performed squat pivot transfers with mod A overall, assist for LLE placement and verbal cues for hand placement and technique. NuStep using BLE only with focus on LLE coordination, timing and sequencing, and adduction to neutral, patient instructed to squeeze small ball between knees to prevent excessive abduction and max multimodal cues for midline trunk positioning x 7 min at level 1-2. Patient returned to room and left sitting in wheelchair with quick release belt and 1/2 lap tray donned, call bell in lap, and boyfriend present.    Therapy Documentation Precautions:  Precautions Precautions: Fall Precaution Comments: left  hemiparesis Restrictions Weight Bearing Restrictions: No Pain: Pain Assessment Pain Assessment: No/denies pain   See Function Navigator for Current Functional Status.   Therapy/Group: Individual Therapy  Laretta Alstrom 06/19/2015, 5:26 PM

## 2015-06-19 NOTE — Progress Notes (Signed)
Physical Therapy Session Note  Patient Details  Name: Rebecca Yoder MRN: [id number] Date of Birth: [birth year]  Today's Date: 06/19/2015 PT Individual Time: 7672-0947 PT Individual Time Calculation (min): 43 min   Short Term Goals: Week 1:  PT Short Term Goal 1 (Week 1): Pt will initiate stair training during PT session.  PT Short Term Goal 2 (Week 1): Pt will initiate ambulation with HW during PT session.  PT Short Term Goal 3 (Week 1): Pt will demonstrate sit to stand req min A.  PT Short Term Goal 4 (Week 1): Pt will self propel manual w/c x 100' req SBA.  PT Short Term Goal 5 (Week 1): Pt will req mod A for dynamic standing balance activities.   Skilled Therapeutic Interventions/Progress Updates:    Pt received in w/c requesting to use toilet.  Pt transferred sit > stand with Stedy and min-mod A.  Pt transferred on/off toilet with Stedy and min-mod A; pt performed hygiene in sitting but required assistance for clothing doffing and donning with cues to maintain activation of LLE and trunk for postural control.  Returned to w/c and performed w/c mobility with bilat LE for coordination and LLE activation x 100' with mod A.  In gym performed NMR: see below for details.  Returned to room and pt left in w/c with all items within reach and quick release belt in place with family present.    Therapy Documentation Precautions:  Precautions Precautions: Fall Precaution Comments: left hemiparesis Restrictions Weight Bearing Restrictions: No Vital Signs: Therapy Vitals Temp: 98 F (36.7 C) Temp Source: Oral Pulse Rate: 92 Resp: 18 BP: 122/82 mmHg Patient Position (if appropriate): Sitting Oxygen Therapy SpO2: 98 % O2 Device: Not Delivered Pain: Pain Assessment Pain Assessment: No/denies pain Other Treatments: Treatments Neuromuscular Facilitation: Left;Lower Extremity;Upper Extremity;Forced use;Activity to increase coordination;Activity to increase motor control;Activity to  increase timing and sequencing;Activity to increase grading;Activity to increase sustained activation;Activity to increase lateral weight shifting;Activity to increase anterior-posterior weight shifting while performing stair negotiation up/down 8 short steps x 2 reps with RUE support on rail with pt initially performing step to sequencing but progressing to alternating sequencing with focus on lateral and anterior weight shifting, trunk control, LLE advancement and activation with mod A overall on L side and max verbal cues for sequencing and safety.  Pt continued NMR on Kinetron first in seated for trunk control training and LLE extension activation and the transitioning to LE extension in standing with focus on maintaining trunk in midline during lateral weight shifting and LLE extension activation as well as muscle grading in RLE.  Required max A overall for balance and support.   See Function Navigator for Current Functional Status.   Therapy/Group: Individual Therapy  Raylene Everts [facility] 06/19/2015, 5:28 PM

## 2015-06-19 NOTE — Consult Note (Signed)
NEUROBEHAVIORAL STATUS EXAM - CONFIDENTIAL Rebecca Yoder Inpatient Rehabilitation   MEDICAL NECESSITY:  Rebecca Yoder was seen on the [facility] for a neurobehavioral status exam owing to the patient's diagnosis of cerebral infarction, and to assist in treatment planning during admission.   According to medical records, [name] is a "49 y.o. right handed female with history of hypertension, previous right brain subcortical infarct October 2014 as well as remote right frontal insular infarct 2008 and MCA stenosis with known PFO maintained on aspirin 325 mg daily. Patient lives with her sister and mother who has cancer and undergoing treatment at this time. Independent prior to admission and driving.Presented 06/11/2015 with sudden onset of left-sided weakness, dysarthria and right gaze deviation. Cranial CT scan negative for acute changes. Patient did receive TPA. CTA of head and neck shows occluded M1 segment of the right middle cerebral artery that appeared to have progressed since prior MR angiogram.MRI 06/13/2015 shows progressive nonhemorrhagic infarct involving the right basal ganglia. Remote anterior right MCA territory infarct involving the insular cortex."  During today's visit, [name] initially denied suffering from any cognitive difficulties, but she later admitted to lifelong issues with memory. For this she needs to immediately do something that is asked of her or else she will forget. She also admitted to lifelong learning issues with math and reading. Academically, she graduated from high school but required special assistance. She was taking classes "to better [herself]" at [employer] who also assisted with job placement.  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   From an emotional standpoint, [name] denied ever being diagnosed with or treated for a mental health condition. She described her current mood as "okay" but  also admitted to experiencing bouts of mild sadness in light of having a third stroke; however, this resolved on its own. She said that she was particularly ruminating about what could have happened to make the situation worse (e.g., thinking she could have had the stroke while driving rather than at work). No adjustment issues endorsed. Suicidal/homicidal ideation, plan or intent was denied. No manic or hypomanic episodes were reported. The patient denied ever experiencing any auditory/visual hallucinations. No major behavioral or personality changes were endorsed.   Patient feels that progress is being made in therapy. No barriers to therapy identified. She described the rehab staff as "great" and she was very complimentary about her care throughout her entire admission.  She has ample support via several friends and family members.   PROCEDURES: [2 units 50539] Diagnostic clinical interview  Review of available records Montreal Cognitive Assessment (short-form)  MENTAL STATUS: [name] mental status exam score of 11/22 was well below the cutoff suggested to indicate frank cognitive impairment and possible dementia. Deficits in attention, processing speed, language, fluency, and learning and memory were observed. Abstraction and orientation were intact.    IMPRESSION: Overall, [name] appears to be suffering from multiple cognitive deficits at the level of dementia. While all etiologies may not be entirely known, the most salient are likely to be cerebrovascular compromise as well as low estimated premorbid abilities. Thankfully, the patient seems to be adjusting well to her present medical situation and she denies any overt depression or anxiety. She also has ample social support.   At present, I see no need for neuropsychology to follow-up with [name] for the remainder of her admission unless requested by the rehab staff or the patient. She would benefit from comprehensive  neuropsychological evaluation upon discharge to better assess her cognitive strengths and challenges as well as to assess for interval change. This type of information can also serve to assess her ability to operate in day to day life as well as at work. I will ask her social worker to provide her with information on available providers.    DIAGNOSES:  Major Neurocognitive Disorder (i.e., Vascular Dementia) Possible intellectual and/or learning disability    Rebecca Yoder, Psy.D.  Clinical Neuropsychologist

## 2015-06-19 NOTE — Progress Notes (Signed)
Occupational Therapy Session Note  Patient Details  Name: Rebecca Yoder MRN: [id number] Date of Birth: [birth year]  Today's Date: 06/19/2015 OT Individual Time: 4081-4481 OT Individual Time Calculation (min): 75 min   Short Term Goals: Week 1:  OT Short Term Goal 1 (Week 1): Pt will transfer to shower bench with steady  A squat pivot transfer. OT Short Term Goal 2 (Week 1): Pt will bathe in shower with steady A using long handled sponge. OT Short Term Goal 3 (Week 1): Pt will don shirt with min A    OT Short Term Goal 4 (Week 1): Pt will don pants with min A to steady her balance.   OT Short Term Goal 5 (Week 1): Pt will be able to maintain static standing balance with min A.  Skilled Therapeutic Interventions/Progress Updates: ADL-retraining with focus on bed mobility, NMR of L-U/LE, transfers, weight-shifting, safety awareness.   Pt received supine in bed, HOB elevated after self-feeding.   With max instructional cues (using Bobath method to grasp/ext LUE with right), pt able to complete bed mobility to roll to her right and sit at edge of bed without rails and with HOB lowered.   Pt then completed stand pivot transfer to w/c with mod assist to lift and lower safely.   Pt performed lateral scoot to tub bench from w/c and bathed with min instructional cues using LH sponge to wash back and legs crossed over to reach each foot.   Pt required min assist to wash buttocks although able to perform lateral lean to partially wash herself.   Pt returns to edge of bed to dress with mod instructional cues to initiate and complete dressing left side first.   Pt able to perform adapted bathing/dressing skills when cued but carry-over appears limited d/t impaired memory.     Therapy Documentation Precautions:  Precautions Precautions: Fall Precaution Comments: left hemiparesis Restrictions Weight Bearing Restrictions: No  Pain: Pain Assessment Pain Assessment: 0-10 Pain Score: 8  Pain Type: Acute  pain Pain Location: Head Pain Frequency: Occasional Pain Onset: Gradual Pain Intervention(s): Medication (See eMAR)   ADL: ADL ADL Comments: refer to functional navigator  See Function Navigator for Current Functional Status.   Therapy/Group: Individual Therapy  [place] 06/19/2015, 11:05 AM

## 2015-06-19 NOTE — Progress Notes (Signed)
Speech Language Pathology Daily Session Note  Patient Details  Name: Rebecca Yoder MRN: [id number] Date of Birth: [birth year]  Today's Date: 06/19/2015 SLP Individual Time: 1105-1200 SLP Individual Time Calculation (min): 55 min  Short Term Goals: Week 1: SLP Short Term Goal 1 (Week 1): Pt will utilize increased vocal intensity, pacing, and overarticulation to achieve intelligibility in sentences with min assist verbal cues.  SLP Short Term Goal 2 (Week 1): Pt will return demonstration of at least 2 safety precautions during functional tasks over 3 targeted sessions with min assist verbal and visual cues.  SLP Short Term Goal 3 (Week 1): Pt will consume dys 2 textures and thin liquids with supervision cues to monitor and correct left sided buccal residue over 2 targeted sessions prior to diet advancement.   SLP Short Term Goal 4 (Week 1): Pt will consume advanced solids with supervision cues to monitor and correct left sided buccal residue over 2 targeted session prior to diet advancement..    Skilled Therapeutic Interventions:  Pt was seen for skilled ST targeting goals for communication and dysphagia.  SLP facilitated the session with a semi-complex barrier task targeting use of dysarthria strategies.  Pt increased vocal intensity and utilized slow rate to achieve intelligibility when describing pictures with overall min assist verbal cues.  SLP also facilitated the session with trials of advanced textures to continue working towards diet progression.   Pt consumed a graham cracker with peanut butter with overall supervision cues for use of swallowing precautions.  Pt was able to clear her oral cavity of residual solids with thin liquid wash and lingual sweep.  No overt s/s of aspiration were evident with solids or liquids.  Recommend diet advancement to dys 3 textures with continued thin liquids and intermittent supervision for use of swallowing precautions. Pt left upright in wheelchair with  quick release belt donned and call bell within reach.  Continue per current plan of care.    Function:  Eating Eating   Modified Consistency Diet: Yes Eating Assist Level: Supervision or verbal cues           Cognition Comprehension Comprehension assist level: Understands basic 90% of the time/cues < 10% of the time  Expression   Expression assist level: Expresses basic 50 - 74% of the time/requires cueing 25 - 49% of the time. Needs to repeat parts of sentences.  Social Interaction Social Interaction assist level: Interacts appropriately 90% of the time - Needs monitoring or encouragement for participation or interaction.  Problem Solving Problem solving assist level: Solves basic 75 - 89% of the time/requires cueing 10 - 24% of the time  Memory Memory assist level: Recognizes or recalls 50 - 74% of the time/requires cueing 25 - 49% of the time    Pain Pain Assessment Pain Assessment: No/denies pain  Therapy/Group: Individual Therapy  Qamar Aughenbaugh, Selinda Orion 06/19/2015, 12:52 PM

## 2015-06-19 NOTE — Progress Notes (Signed)
Patient ID: Rebecca Yoder, female   DOB: 1966/06/22, 49 y.o.   MRN: [id number]   Subjective/Complaints: No issues overnite Pt with improving Left hand movements  ROS:  - CP, -SOB, - Joint pains  Objective: Vital Signs: Blood pressure 105/59, pulse 77, temperature 98.3 F (36.8 C), temperature source Oral, resp. rate 18, height 5' 4.5" (1.638 m), weight 81.693 kg (180 lb 1.6 oz), last menstrual period 05/14/2015, SpO2 99 %. No results found. Results for orders placed or performed during the hospital encounter of 06/14/15 (from the past 72 hour(s))  Occult blood card to lab, stool RN will collect     Status: Abnormal   Collection Time: 06/16/15  1:19 PM  Result Value Ref Range   Fecal Occult Bld POSITIVE (A) NEGATIVE  Occult blood card to lab, stool RN will collect     Status: None   Collection Time: 06/17/15  8:06 AM  Result Value Ref Range   Fecal Occult Bld NEGATIVE NEGATIVE     HEENT: normal Cardio: RRR Resp: CTA B/L and unlabored GI: BS positive and NT, ND Extremity:  No Edema Skin:   Intact Neuro: Alert/Oriented, Abnormal Sensory reduced in Left foot, Abnormal Motor 3- Left delt bi tri grip, 3- L HF, 2- L KE, ankle, Abnormal FMC Ataxic/ dec FMC and Tone  Hypertonia, Tone:  Hypertonia and Inattention Musc/Skel:  Other no pain with UE or LE ROM   Assessment/Plan: 1. Functional deficits secondary to RIght MCA infarct which require 3+ hours per day of interdisciplinary therapy in a comprehensive inpatient rehab setting. Physiatrist is providing close team supervision and 24 hour management of active medical problems listed below. Physiatrist and rehab team continue to assess barriers to discharge/monitor patient progress toward functional and medical goals. FIM: Function - Bathing Position: Shower Body parts bathed by patient: Left arm, Chest, Abdomen, Front perineal area, Right upper leg, Right lower leg, Left upper leg, Left lower leg Body parts bathed by helper: Right  arm, Buttocks, Back Assist Level: Touching or steadying assistance(Pt > 75%)  Function- Upper Body Dressing/Undressing What is the patient wearing?: Pull over shirt/dress Pull over shirt/dress - Perfomed by patient: Thread/unthread right sleeve, Put head through opening Pull over shirt/dress - Perfomed by helper: Pull shirt over trunk, Thread/unthread left sleeve Assist Level: Touching or steadying assistance(Pt > 75%) Function - Lower Body Dressing/Undressing What is the patient wearing?: Underwear, Non-skid slipper socks, Shoes, Pants Position: Wheelchair/chair at sink Underwear - Performed by helper: Thread/unthread right underwear leg, Thread/unthread left underwear leg, Pull underwear up/down (Briefs used) Pants- Performed by patient: Thread/unthread right pants leg, Thread/unthread left pants leg Pants- Performed by helper: Pull pants up/down, Fasten/unfasten pants Non-skid slipper socks- Performed by helper: Don/doff right sock, Don/doff left sock Socks - Performed by patient: Don/doff right sock, Don/doff left sock Socks - Performed by helper:  (sitting balance) Shoes - Performed by patient: Don/doff right shoe, Don/doff left shoe Shoes - Performed by helper: Don/doff right shoe, Don/doff left shoe, Fasten right, Fasten left Assist for footwear: Partial/moderate assist Assist for lower body dressing: Touching or steadying assistance (Pt > 75%)  Function - Toileting Toileting steps completed by patient: Performs perineal hygiene Toileting steps completed by helper: Adjust clothing after toileting, Adjust clothing prior to toileting Toileting Assistive Devices: Grab bar or rail Assist level: Touching or steadying assistance (Pt.75%)  Function - Air cabin crew transfer assistive device: Elevated toilet seat/BSC over toilet, Grab bar Mechanical lift: Stedy Assist level to toilet: Moderate assist (Pt 50 -  74%/lift or lower) Assist level from toilet: Moderate assist (Pt 50  - 74%/lift or lower) Assist level to bedside commode (at bedside): Maximal assist (Pt 25 - 49%/lift and lower) Assist level from bedside commode (at bedside): Maximal assist (Pt 25 - 49%/lift and lower)  Function - Chair/bed transfer Chair/bed transfer method: Squat pivot Chair/bed transfer assist level: Moderate assist (Pt 50 - 74%/lift or lower) Chair/bed transfer assistive device: Armrests Chair/bed transfer details: Manual facilitation for placement, Manual facilitation for weight shifting, Verbal cues for sequencing, Verbal cues for technique, Verbal cues for precautions/safety  Function - Locomotion: Wheelchair Will patient use wheelchair at discharge?: Yes Type: Manual Max wheelchair distance: 150 Assist Level: Supervision or verbal cues Assist Level: Supervision or verbal cues Wheel 150 feet activity did not occur: Safety/medical concerns Assist Level: Supervision or verbal cues Turns around,maneuvers to table,bed, and toilet,negotiates 3% grade,maneuvers on rugs and over doorsills: No Function - Locomotion: Ambulation Assistive device: Rail in hallway Max distance: 20 Assist level: Moderate assist (Pt 50 - 74%) Assist level: Moderate assist (Pt 50 - 74%) Walk 50 feet with 2 turns activity did not occur: Safety/medical concerns Walk 150 feet activity did not occur: Safety/medical concerns Walk 10 feet on uneven surfaces activity did not occur: Safety/medical concerns  Function - Comprehension Comprehension: Auditory Comprehension assist level: Follows basic conversation/direction with no assist  Function - Expression Expression: Verbal Expression assist level: Expresses basic 50 - 74% of the time/requires cueing 25 - 49% of the time. Needs to repeat parts of sentences.  Function - Social Interaction Social Interaction assist level: Interacts appropriately 90% of the time - Needs monitoring or encouragement for participation or interaction.  Function - Problem  Solving Problem solving assist level: Solves basic 75 - 89% of the time/requires cueing 10 - 24% of the time  Function - Memory Memory assist level: Recognizes or recalls 75 - 89% of the time/requires cueing 10 - 24% of the time Patient normally able to recall (first 3 days only): Current season, Location of own room, Staff names and faces, That he or she is in a hospital    Medical Problem List and Plan: 1. Functional deficits secondary to right MCA infarct with history of prior CVA 2008 as well as 2014 2.  DVT Prophylaxis/Anticoagulation: SCDs. Venous Doppler is negative 3. Pain Management: Tylenol as needed 4. Dysphagia. Dysphagia #2 thin liquid diet. Monitor for any signs of aspiration. Follow-up speech therapy 5. Neuropsych: This patient is capable of making decisions on her own behalf. 6. Skin/Wound Care: Routine skin checks 7. Fluids/Electrolytes/Nutrition: Routine high nose with follow-up chemistries 8. Hyperlipidemia. Continue Pravachol 9. Hypertension. Norvasc 2.5 mg daily, Cozaar 100 mg daily, HCTZ 12.5 mg daily. Monitor with increased mobility, controlled at 105/59, monitor orthostatic 10. History of asthma. Patient on albuterol inhaler as needed prior to admission 11.  Anemia- Fe studies ok, one neg and one pos stool guaic but this was during menstruation, monitor  LOS (Days) 5 A FACE TO FACE EVALUATION WAS PERFORMED  Jocelin Schuelke E 06/19/2015, 6:54 AM

## 2015-06-20 ENCOUNTER — Inpatient Hospital Stay (HOSPITAL_COMMUNITY): Payer: Medicaid Other

## 2015-06-20 ENCOUNTER — Inpatient Hospital Stay (HOSPITAL_COMMUNITY): Payer: Medicaid Other | Admitting: Occupational Therapy

## 2015-06-20 ENCOUNTER — Inpatient Hospital Stay (HOSPITAL_COMMUNITY): Payer: Medicaid Other | Admitting: Speech Pathology

## 2015-06-20 DIAGNOSIS — M25512 Pain in left shoulder: Secondary | ICD-10-CM

## 2015-06-20 MED ORDER — MUSCLE RUB 10-15 % EX CREA
TOPICAL_CREAM | Freq: Two times a day (BID) | CUTANEOUS | Status: DC | PRN
  Administered 2015-06-21: 08:00:00 via TOPICAL
  Filled 2015-06-20: qty 85

## 2015-06-20 NOTE — Progress Notes (Signed)
Occupational Therapy Session Note  Patient Details  Name: Rebecca Yoder MRN: [id number] Date of Birth: [birth year]  Today's Date: 06/20/2015 OT Individual Time: 0830-1000 OT Individual Time Calculation (min): 90 min    Short Term Goals: Week 1:  OT Short Term Goal 1 (Week 1): Pt will transfer to shower bench with steady  A squat pivot transfer. OT Short Term Goal 2 (Week 1): Pt will bathe in shower with steady A using long handled sponge. OT Short Term Goal 3 (Week 1): Pt will don shirt with min A    OT Short Term Goal 4 (Week 1): Pt will don pants with min A to steady her balance.   OT Short Term Goal 5 (Week 1): Pt will be able to maintain static standing balance with min A.  Skilled Therapeutic Interventions/Progress Updates:    Pt seen this session to facilitate dynamic balance, functional mobility, and LUE functional movement with ADL retraining to include shower and dressing. Pt received in w/c, she mentioned that she has been having L shoulder pain these last 2 days. Pt has been doing self ROM overhead and not positioning arm with pillows in bed. Reviewed safe bed positioning with pillows (demonstrated at end of session when pt got back in bed).  Reviewed safe self ROM with arm/ hand position and only lifting arm to 90 degrees.  Pt eager to shower. Completed squat pivot to shower bench using grab bar with only guiding assist and min cue for forward lean and full hip lift. In shower, pt used AE to bathe self except for buttocks.  Min cues with long sponge to wash R arm.  Pt stood with min a to stabilize L knee to wash hips.  For dressing, min cues and set up with L sleeve to don shirt. Pt learning adaptive techniques rapidly.  Donned LB clothing with min A to stabilize standing balance with RUE support and mod A without RUE.  Pt completed self care, propelled self in w/c to gym. Worked on squat pivot vs. Scooting transfer techniques with facilitation of full forward lean and wt shift forward  to fully lift hips.  On mat, LUE NMR with tapping and a/arom in gravity eliminated position to facilitate elbow and wrist active movement. Pt presents with passive wrist drop, but she can extend wrist with full attention on hand.  Good improvement with finger flex/ ext and elbow flexion.  Pt requested to lay down as she had a small HA.  Pt trnasferred to bed adjusted with pillows to position L arm.  Therapy Documentation Precautions:  Precautions Precautions: Fall Precaution Comments: left hemiparesis Restrictions Weight Bearing Restrictions: No    Pain: Pain Assessment Pain Assessment: 0-10 Pain Score: 2  Pain Type: Acute pain Pain Location: Shoulder Pain Orientation: Left Pain Descriptors / Indicators: Sharp Pain Onset: With Activity (with lifting arm above 90, educated pt on safe positioning of arm)  ADL: ADL ADL Comments: refer to functional navigator  See Function Navigator for Current Functional Status.   Therapy/Group: Individual Therapy  Azucena Dart 06/20/2015, 12:22 PM

## 2015-06-20 NOTE — Progress Notes (Signed)
Physical Therapy Session Note  Patient Details  Name: Rebecca Yoder MRN: [id number] Date of Birth: Dec 30, 1965  Today's Date: 06/20/2015 PT Individual Time: 4098-1191 PT Individual Time Calculation (min): 42 min   Short Term Goals: Week 1:  PT Short Term Goal 1 (Week 1): Pt will initiate stair training during PT session.  PT Short Term Goal 2 (Week 1): Pt will initiate ambulation with HW during PT session.  PT Short Term Goal 3 (Week 1): Pt will demonstrate sit to stand req min A.  PT Short Term Goal 4 (Week 1): Pt will self propel manual w/c x 100' req SBA.  PT Short Term Goal 5 (Week 1): Pt will req mod A for dynamic standing balance activities.   Skilled Therapeutic Interventions/Progress Updates:    Patient seen for 1:1 session for focus on gait, left LE weight bearing and for hip and knee extension in stance.  Patient performing transfers throughout session both squat pivot and sit to stand min to mod A with cues and A for left foot placement with AFO on left.  Patient ambulated about 11' mod A with hemiwalker and L AFO assist and cues for left LE placement at times, left hip and knee extension in stance.  Standing with hemiwalker tapping right foot to targets on floor mod to max support and cues for left hip extension in left stance.  Tall kneeling on mat with bench for UE support lateral weight shifts cues for hip extension and for heel sit to tall kneel.  Patient side stepping at mat to left A for left LE placement and cues for stance stability moving right foot over with hemiwalker.  Patient back in w/c and pushed by staff back to room due to financial counselor set up for appointment to meet w/ pt and sister for Medicaid application.  Patient in room with counselor, call bell in reach and quick release belt in place.  Therapy Documentation Precautions:  Precautions Precautions: Fall Precaution Comments: left hemiparesis Restrictions Weight Bearing Restrictions: No General: PT  Amount of Missed Time (min): 18 Minutes PT Missed Treatment Reason: Other (Comment) (appointment for Medicaid application) Vital Signs: Therapy Vitals Temp: 97.6 F (36.4 C) Temp Source: Oral Pulse Rate: 75 Resp: 18 BP: 125/75 mmHg Patient Position (if appropriate): Sitting Oxygen Therapy SpO2: 98 % O2 Device: Not Delivered Pain: Pain Assessment Pain Assessment: No/denies pain  See Function Navigator for Current Functional Status.   Therapy/Group: Individual Therapy  Viola, [place]en[place] 06/20/2015  06/20/2015, 3:50 PM

## 2015-06-20 NOTE — Progress Notes (Signed)
Speech Language Pathology Daily Session Note  Patient Details  Name: Rebecca Yoder MRN: [id number] Date of Birth: 02-12-1966  Today's Date: 06/20/2015 SLP Individual Time: 1300-1400 SLP Individual Time Calculation (min): 60 min  Short Term Goals: Week 1: SLP Short Term Goal 1 (Week 1): Pt will utilize increased vocal intensity, pacing, and overarticulation to achieve intelligibility in sentences with min assist verbal cues.  SLP Short Term Goal 2 (Week 1): Pt will return demonstration of at least 2 safety precautions during functional tasks over 3 targeted sessions with min assist verbal and visual cues.  SLP Short Term Goal 3 (Week 1): Pt will consume dys 2 textures and thin liquids with supervision cues to monitor and correct left sided buccal residue over 2 targeted sessions prior to diet advancement.   SLP Short Term Goal 4 (Week 1): Pt will consume advanced solids with supervision cues to monitor and correct left sided buccal residue over 2 targeted session prior to diet advancement..    Skilled Therapeutic Interventions:  Pt was seen for skilled ST targeting goals for communication and dysphagia.  SLP completed skilled observations during lunch meal tray to assess toleration of recently upgraded textures.  Pt consumed dys 3 textures and thin liquids with mod I use of swallowing precautions in a moderately distracting environment (TV on).  No overt s/s of aspiration were evident with solids or liquids and pt demonstrated effective mastication and clearance of solids from the oral cavity post swallow.  Recommend that pt remain on dys 3 textures and thin liquids with intermittent supervision.  Prognosis for further advancement good with trials of advanced textures with SLP.  Pt also utilized increased vocal intensity and pacing during loosely structured conversations with the SLP in highly distracting environments and contexts (i.e. When transferring on and off commode and when engaged in a card  game with the door to the room open) with min-mod assist verbal cues from SLP.  Pt was left in recliner with quick release belt donned and call bell within reach.  Continue per current plan of care.    Function:  Eating Eating   Modified Consistency Diet: Yes Eating Assist Level: Supervision or verbal cues   Eating Set Up Assist For: Opening containers       Cognition Comprehension Comprehension assist level: Understands basic 90% of the time/cues < 10% of the time  Expression   Expression assist level: Expresses basic 50 - 74% of the time/requires cueing 25 - 49% of the time. Needs to repeat parts of sentences.  Social Interaction Social Interaction assist level: Interacts appropriately 90% of the time - Needs monitoring or encouragement for participation or interaction.  Problem Solving Problem solving assist level: Solves basic 75 - 89% of the time/requires cueing 10 - 24% of the time  Memory Memory assist level: Recognizes or recalls 50 - 74% of the time/requires cueing 25 - 49% of the time    Pain Pain Assessment Pain Assessment: No/denies pain  Therapy/Group: Individual Therapy  Vasiliy Mccarry, Selinda Orion 06/20/2015, 2:05 PM

## 2015-06-20 NOTE — Progress Notes (Addendum)
Patient ID: Rebecca Yoder, female   DOB: 27-Dec-1965, 49 y.o.   MRN: [id number]   Subjective/Complaints: No issues overnite. "who is president?" Appeciate neuropsych note  ROS:  - CP, -SOB, - Joint pains  Objective: Vital Signs: Blood pressure 157/58, pulse 78, temperature 97.9 F (36.6 C), temperature source Oral, resp. rate 18, height 5' 4.5" (1.638 m), weight 81.693 kg (180 lb 1.6 oz), last menstrual period 05/14/2015, SpO2 99 %. No results found. Results for orders placed or performed during the hospital encounter of 06/14/15 (from the past 72 hour(s))  Occult blood card to lab, stool RN will collect     Status: None   Collection Time: 06/17/15  8:06 AM  Result Value Ref Range   Fecal Occult Bld NEGATIVE NEGATIVE     HEENT: normal Cardio: RRR Resp: CTA B/L and unlabored GI: BS positive and NT, ND Extremity:  No Edema Skin:   Intact Neuro: Alert/Oriented, Abnormal Sensory reduced in Left foot, Abnormal Motor 3- Left delt bi tri grip, 3- L HF, 2- L KE, ankle, Abnormal FMC Ataxic/ dec FMC and Tone  Hypertonia, Tone:  Hypertonia and Inattention Musc/Skel:  Other no pain with UE or LE ROM   Assessment/Plan: 1. Functional deficits secondary to RIght MCA infarct which require 3+ hours per day of interdisciplinary therapy in a comprehensive inpatient rehab setting. Physiatrist is providing close team supervision and 24 hour management of active medical problems listed below. Physiatrist and rehab team continue to assess barriers to discharge/monitor patient progress toward functional and medical goals. Team conference today please see physician documentation under team conference tab, met with team face-to-face to discuss problems,progress, and goals. Formulized individual treatment plan based on medical history, underlying problem and comorbidities. FIM: Function - Bathing Position: Shower Body parts bathed by patient: Right arm, Left arm, Chest, Abdomen, Front perineal area,  Right upper leg, Left upper leg, Right lower leg, Left lower leg Body parts bathed by helper: Back, Buttocks Assist Level: Touching or steadying assistance(Pt > 75%)  Function- Upper Body Dressing/Undressing What is the patient wearing?: Pull over shirt/dress Pull over shirt/dress - Perfomed by patient: Thread/unthread right sleeve, Thread/unthread left sleeve, Put head through opening, Pull shirt over trunk Pull over shirt/dress - Perfomed by helper: Pull shirt over trunk, Thread/unthread left sleeve Assist Level: Supervision or verbal cues Function - Lower Body Dressing/Undressing What is the patient wearing?: Underwear, Pants, Non-skid slipper socks, Socks Position: Sitting EOB Underwear - Performed by patient: Thread/unthread right underwear leg, Thread/unthread left underwear leg Underwear - Performed by helper: Pull underwear up/down Pants- Performed by patient: Thread/unthread right pants leg, Thread/unthread left pants leg Pants- Performed by helper: Pull pants up/down Non-skid slipper socks- Performed by patient: Don/doff right sock, Don/doff left sock Non-skid slipper socks- Performed by helper: Don/doff right sock, Don/doff left sock Socks - Performed by patient: Don/doff right sock, Don/doff left sock Socks - Performed by helper:  (sitting balance) Shoes - Performed by patient: Don/doff right shoe, Don/doff left shoe Shoes - Performed by helper: Don/doff right shoe, Don/doff left shoe, Fasten right, Fasten left Assist for footwear: Partial/moderate assist Assist for lower body dressing: Touching or steadying assistance (Pt > 75%)  Function - Toileting Toileting steps completed by patient: Performs perineal hygiene Toileting steps completed by helper: Adjust clothing after toileting, Adjust clothing prior to toileting Toileting Assistive Devices: Grab bar or rail Assist level: Touching or steadying assistance (Pt.75%)  Function - Toilet Transfers Toilet transfer assistive  device: Elevated toilet seat/BSC over toilet, Grab  bar Mechanical lift: Stedy Assist level to toilet: Moderate assist (Pt 50 - 74%/lift or lower) Assist level from toilet: Moderate assist (Pt 50 - 74%/lift or lower) Assist level to bedside commode (at bedside): Maximal assist (Pt 25 - 49%/lift and lower) Assist level from bedside commode (at bedside): Maximal assist (Pt 25 - 49%/lift and lower)  Function - Chair/bed transfer Chair/bed transfer method: Squat pivot Chair/bed transfer assist level: Moderate assist (Pt 50 - 74%/lift or lower) Chair/bed transfer assistive device: Armrests Chair/bed transfer details: Manual facilitation for placement, Manual facilitation for weight shifting, Verbal cues for sequencing, Verbal cues for technique  Function - Locomotion: Wheelchair Will patient use wheelchair at discharge?: Yes Type: Manual Max wheelchair distance: 150 Assist Level: Supervision or verbal cues Assist Level: Supervision or verbal cues Wheel 150 feet activity did not occur: Safety/medical concerns Assist Level: Supervision or verbal cues Turns around,maneuvers to table,bed, and toilet,negotiates 3% grade,maneuvers on rugs and over doorsills: No Function - Locomotion: Ambulation Assistive device: Walker-hemi Max distance: 50 ft Assist level: Maximal assist (Pt 25 - 49%) Assist level: Maximal assist (Pt 25 - 49%) Walk 50 feet with 2 turns activity did not occur: Safety/medical concerns Assist level: Maximal assist (Pt 25 - 49%) Walk 150 feet activity did not occur: Safety/medical concerns Walk 10 feet on uneven surfaces activity did not occur: Safety/medical concerns  Function - Comprehension Comprehension: Auditory Comprehension assist level: Understands basic 90% of the time/cues < 10% of the time  Function - Expression Expression: Verbal Expression assist level: Expresses basic 50 - 74% of the time/requires cueing 25 - 49% of the time. Needs to repeat parts of  sentences.  Function - Social Interaction Social Interaction assist level: Interacts appropriately 90% of the time - Needs monitoring or encouragement for participation or interaction.  Function - Problem Solving Problem solving assist level: Solves basic 75 - 89% of the time/requires cueing 10 - 24% of the time  Function - Memory Memory assist level: Recognizes or recalls 50 - 74% of the time/requires cueing 25 - 49% of the time Patient normally able to recall (first 3 days only): Current season, Location of own room, Staff names and faces, That he or she is in a hospital    Medical Problem List and Plan: 1. Functional deficits secondary to right MCA infarct with history of prior CVA 2008 as well as 2014, left hemiparesis 2.  DVT Prophylaxis/Anticoagulation: SCDs. Venous Doppler is negative 3. Pain Management: Tylenol as needed, hemiplegic shoulder pain- sportscreme and Kpad 4. Dysphagia. Dysphagia #2 thin liquid diet. Monitor for any signs of aspiration. Follow-up speech therapy 5. Neuropsych: This patient is capable of making decisions on her own behalf. 6. Skin/Wound Care: Routine skin checks 7. Fluids/Electrolytes/Nutrition: Routine high nose with follow-up chemistries 8. Hyperlipidemia. Continue Pravachol 9. Hypertension. Norvasc 2.5 mg daily, Cozaar 100 mg daily, HCTZ 12.5 mg daily. Monitor with increased mobility, controlled , monitor orthostatic 10. History of asthma. Patient on albuterol inhaler as needed prior to admission 11.  Anemia- Fe studies ok, 2 neg and one pos stool guaic but this was during menstruation, monitor, recheck CBC  LOS (Days) 6 A FACE TO FACE EVALUATION WAS PERFORMED  KIRSTEINS,ANDREW E 06/20/2015, 6:41 AM

## 2015-06-20 NOTE — Patient Care Conference (Signed)
Inpatient RehabilitationTeam Conference and Plan of Care Update Date: 06/20/2015   Time: 2:05 PM    Patient Name: Rebecca Yoder      Medical Record Number: [id number]  Date of Birth: 05-Apr-1966 Sex: Female         Room/Bed: 4M02C/4M02C-01 Payor Info: Payor: /    Admitting Diagnosis: R CVA  Admit Date/Time:  06/14/2015  4:04 PM Admission Comments: No comment available   Primary Diagnosis:  <principal problem not specified> Principal Problem: <principal problem not specified>  Patient Active Problem List   Diagnosis Date Noted  . Right middle cerebral artery stroke ([place]) 06/14/2015  . Left hemiparesis ([place])   . Left-sided neglect   . Acute left hemiparesis ([place]) 06/13/2015  . Hemi-neglect of left side 06/13/2015  . Stroke (cerebrum) ([place]) 06/11/2015  . TIA (transient ischemic attack) 06/09/2013  . Hypokalemia 06/09/2013  . PFO (patent foramen ovale) 06/09/2013  . CVA (cerebral infarction) 06/09/2013  . Hyperlipidemia 12/10/2009  . Essential hypertension 12/10/2009  . Asthma 12/10/2009  . CEREBROVASCULAR ACCIDENT, HX OF 12/10/2009    Expected Discharge Date: Expected Discharge Date: 07/04/15  Team Members Present: Physician leading conference: Dr. Alger Simons Social Worker Present: Lennart Pall, LCSW Nurse Present: Heather Roberts, RN PT Present: Raylene Everts, PT OT Present: Benay Pillow, OT SLP Present: Windell Moulding, SLP PPS Coordinator present : Daiva Nakayama, RN, CRRN     Current Status/Progress Goal Weekly Team Focus  Medical   Right MCA with left hemiparesis  safety awareness, reduce pain, transfer educations  reduction in pain   Bowel/Bladder   Continent of bowel. Occasional incontinece of bladder with urgency. LBM 06/17/15. PRN Medications given  Remain contient of bowel and bladder.   Offer toileting q2 hours and prm. Encourage use of stool softeners.    Swallow/Nutrition/ Hydration   Dys 3, thin liquids, intermittent supervision   mod I   trials of advanced  textures    ADL's   Min A upper body BADL. Mod A lower body BADL, Min-Mod A transfers  Overall supervision fro BADL and light homemaking, Mod I self-feeding and dynamic sitting balance  NMR of LUE, transfers, adapted bathing/dressing, static/dynamic standing balance   Mobility   Mod-max transfers, max A gait with wall rail and stair negotiation, supervision-min A w/c mobility  Mod I bed mobility, w/c mobility, supervision transfers, supervision gait, min A stairs  Safety with mobility, motor planning/sequencing transfers, gait, stair negotiation   Communication   moderate dysarthria, min cues for use of slow rate, overarticulation, and increased vocal intensity   supervision   carryover of use of compensatory strategies in challenging contexts   Safety/Cognition/ Behavioral Observations  needs quick release belt due to impulsivity with movement but suspect that pt is near her baseline due to previous CVAs   supervision   address use of safety precautions and emergent awareness of deficits   Pain   Occasional complaints of headache. Pt given tyelenol prn Q4 hours.   <4  Assess for pain frequently and treat as needed.    Skin   Clean. Dry. Intact.   Remain free of breakdown and infection  Assess skin Q shift and prn    Rehab Goals Patient on target to meet rehab goals: Yes *See Care Plan and progress notes for long and short-term goals.  Barriers to Discharge: Shoulder pain    Possible Resolutions to Barriers:  Optimize meds, education, and stroke training    Discharge Planning/Teaching Needs:  Plan for pt to d/c home  with sister, Rebecca Yoder.  Family has arranged 24/7 assistance at home.  Sister reports she began training this past weekend and agreeable to continue as much as team feels necessary.   Team Discussion:  Making good progress and currently at mod/ max assist overall.  Still with some impulsivity and inattention but improving.  Close to baseline with cognition?  SW reports  family able to provide 24/7 assist.  Pt very motivated.  Revisions to Treatment Plan:  NA   Continued Need for Acute Rehabilitation Level of Care: The patient requires daily medical management by a physician with specialized training in physical medicine and rehabilitation for the following conditions: Daily direction of a multidisciplinary physical rehabilitation program to ensure safe treatment while eliciting the highest outcome that is of practical value to the patient.: Yes Daily medical management of patient stability for increased activity during participation in an intensive rehabilitation regime.: Yes Daily analysis of laboratory values and/or radiology reports with any subsequent need for medication adjustment of medical intervention for : Neurological problems  Rebecca Yoder 06/21/2015, 10:54 AM

## 2015-06-21 ENCOUNTER — Inpatient Hospital Stay (HOSPITAL_COMMUNITY): Payer: Medicaid Other | Admitting: Occupational Therapy

## 2015-06-21 ENCOUNTER — Ambulatory Visit (HOSPITAL_COMMUNITY): Payer: Self-pay | Admitting: Occupational Therapy

## 2015-06-21 ENCOUNTER — Inpatient Hospital Stay (HOSPITAL_COMMUNITY): Payer: Medicaid Other | Admitting: Speech Pathology

## 2015-06-21 ENCOUNTER — Inpatient Hospital Stay (HOSPITAL_COMMUNITY): Payer: Medicaid Other

## 2015-06-21 MED ORDER — SENNOSIDES-DOCUSATE SODIUM 8.6-50 MG PO TABS
2.0000 | ORAL_TABLET | Freq: Two times a day (BID) | ORAL | Status: DC
  Administered 2015-06-21 – 2015-07-04 (×26): 2 via ORAL
  Filled 2015-06-21 (×27): qty 2

## 2015-06-21 NOTE — Progress Notes (Signed)
Speech Language Pathology Weekly Progress and Session Note  Patient Details  Name: Rebecca Yoder MRN: [id number] Date of Birth: 12/06/1965  Beginning of progress report period: June 15, 2015  End of progress report period: June 22, 2015  Today's Date: 06/21/2015 SLP Individual Time: 1000-1100 SLP Individual Time Calculation (min): 60 min  Short Term Goals: Week 1: SLP Short Term Goal 1 (Week 1): Pt will utilize increased vocal intensity, pacing, and overarticulation to achieve intelligibility in sentences with min assist verbal cues.  SLP Short Term Goal 1 - Progress (Week 1): Met SLP Short Term Goal 2 (Week 1): Pt will return demonstration of at least 2 safety precautions during functional tasks over 3 targeted sessions with min assist verbal and visual cues.  SLP Short Term Goal 2 - Progress (Week 1): Met SLP Short Term Goal 3 (Week 1): Pt will consume dys 2 textures and thin liquids with supervision cues to monitor and correct left sided buccal residue over 2 targeted sessions prior to diet advancement.   SLP Short Term Goal 3 - Progress (Week 1): Met SLP Short Term Goal 4 (Week 1): Pt will consume advanced solids with supervision cues to monitor and correct left sided buccal residue over 2 targeted session prior to diet advancement.Marland Kitchen   SLP Short Term Goal 4 - Progress (Week 1): Met    New Short Term Goals: Week 2: SLP Short Term Goal 1 (Week 2): Pt will utilize increased vocal intensity, pacing, and overarticulation to achieve intelligibility in sentences with supervision cues.  SLP Short Term Goal 2 (Week 2): Pt will return demonstration of at least 2 safety precautions during functional tasks over 3 targeted sessions with supervision.  SLP Short Term Goal 3 (Week 2): Pt will consume dys 3 textures and thin liquids with mod I to monitor and correct left sided buccal residue over 2 targeted sessions prior to diet advancement.   SLP Short Term Goal 4 (Week 2): Pt will consume  regular textures with supervision cues to monitor and correct left sided buccal residue over 2 targeted session prior to diet advancement..    Weekly Progress Updates:  Pt made functional gains this reporting period and has met 4 out of 4 short term goals.  Pt is currently consuming dys 3 textures and thin liquids with intermittent supervision to monitor and correct oral residue.  Pt also requires min assist verbal cues to utilize compensatory dysarthria strategies to achieve intelligibility in sentences.  Additionally, pt has demonstrated improvements in her impulsivity and SLP suspects that pt is near her baseline for cognition.  Pt would continue to benefit from skilled ST while inpatient in order to maximize functional independence and reduce burden of care prior to discharge.  Pt and family education is ongoing.     Intensity: Minumum of 1-2 x/day, 30 to 90 minutes Frequency: 3 to 5 out of 7 days Duration/Length of Stay: 14-21 days Treatment/Interventions: Cognitive remediation/compensation;Cueing hierarchy;Internal/external aids;Functional tasks;Patient/family education;Environmental controls;Dysphagia/aspiration precaution training   Daily Session  Skilled Therapeutic Interventions: Pt was seen for skilled ST targeting communication goals.  SLP facilitated the session with a structured verbal description task targeting use of dysarthria strategies.  SLP utilized a visual barrier during the abovementioned task to maximize pt's independence for use of compensatory strategies.  Pt utilized pacing, overarticulation, and increased vocal intensity to achieve intelligibility in phrases and sentences with min assist verbal cues for repetition.  Pt was returned to room and left with nursing present to assist pt with  toileting needs.  Goals updated on this date to reflect current progress and plan of care.       Function:   Eating Eating                 Cognition Comprehension Comprehension  assist level: Understands basic 90% of the time/cues < 10% of the time  Expression   Expression assist level: Expresses basic 50 - 74% of the time/requires cueing 25 - 49% of the time. Needs to repeat parts of sentences.  Social Interaction Social Interaction assist level: Interacts appropriately with others with medication or extra time (anti-anxiety, antidepressant).  Problem Solving Problem solving assist level: Solves basic 75 - 89% of the time/requires cueing 10 - 24% of the time  Memory Memory assist level: Recognizes or recalls 50 - 74% of the time/requires cueing 25 - 49% of the time   General    Pain Pain Assessment Pain Assessment: No/denies pain   Therapy/Group: Individual Therapy  Kaisha Wachob, Selinda Orion 06/21/2015, 1:21 PM

## 2015-06-21 NOTE — Plan of Care (Signed)
Problem: RH BOWEL ELIMINATION Goal: RH STG MANAGE BOWEL W/MEDICATION W/ASSISTANCE STG Manage Bowel with Medication with min Assistance.  Outcome: Not Progressing Patient requires laxatives and refuses at times.

## 2015-06-21 NOTE — Progress Notes (Signed)
Patient ID: Rebecca Yoder, female   DOB: 1966-02-19, 49 y.o.   MRN: [id number]   Subjective/Complaints: Pt without new issues overnite, discussed D/C date as well as what family assistance is available  ROS:  - CP, -SOB, - Joint pains  Objective: Vital Signs: Blood pressure 124/75, pulse 87, temperature 98.3 F (36.8 C), temperature source Oral, resp. rate 18, height 5' 4.5" (1.638 m), weight 83.1 kg (183 lb 3.2 oz), last menstrual period 06/12/2015, SpO2 98 %. Dg Shoulder 1v Left  06/20/2015  CLINICAL DATA:  Single AP view of the left shoulder, status post recent CVA, tingling and numbness in the fingers associated with left shoulder pain since October 31st. EXAM: LEFT SHOULDER - 1 VIEW COMPARISON:  None impacts FINDINGS: The bones of the left shoulder are adequately mineralized. The joint spaces are reasonably well-maintained. There is no acute fracture nor dislocation. The observed portions of the left clavicle and upper left ribs are normal. The bony structures exhibit no acute abnormalities. IMPRESSION: There is no acute bony abnormality of the left shoulder. Electronically Signed   By: David  Martinique M.D.   On: 06/20/2015 08:05   No results found for this or any previous visit (from the past 72 hour(s)).   HEENT: normal Cardio: RRR Resp: CTA B/L and unlabored GI: BS positive and NT, ND Extremity:  No Edema Skin:   Intact Neuro: Alert/Oriented, Abnormal Sensory reduced in Left foot, Abnormal Motor 3- Left delt bi tri grip, 3- L HF, 2- L KE, ankle, Abnormal FMC Ataxic/ dec FMC and Tone  Hypertonia, Tone:  Hypertonia and Inattention Musc/Skel:  Other no pain with UE or LE ROM   Assessment/Plan: 1. Functional deficits secondary to RIght MCA infarct which require 3+ hours per day of interdisciplinary therapy in a comprehensive inpatient rehab setting. Physiatrist is providing close team supervision and 24 hour management of active medical problems listed below. Physiatrist and rehab  team continue to assess barriers to discharge/monitor patient progress toward functional and medical goals.  FIM: Function - Bathing Position: Shower Body parts bathed by patient: Right arm, Left arm, Chest, Abdomen, Front perineal area, Right upper leg, Left upper leg, Right lower leg, Left lower leg, Back Body parts bathed by helper: Buttocks Assist Level: Touching or steadying assistance(Pt > 75%)  Function- Upper Body Dressing/Undressing What is the patient wearing?: Pull over shirt/dress Pull over shirt/dress - Perfomed by patient: Thread/unthread right sleeve, Thread/unthread left sleeve, Put head through opening, Pull shirt over trunk Pull over shirt/dress - Perfomed by helper: Pull shirt over trunk, Thread/unthread left sleeve Assist Level: Supervision or verbal cues, Set up Function - Lower Body Dressing/Undressing What is the patient wearing?: Underwear, Pants, Socks, Shoes, AFO Position: Wheelchair/chair at sink Underwear - Performed by patient: Thread/unthread right underwear leg, Thread/unthread left underwear leg Underwear - Performed by helper: Pull underwear up/down Pants- Performed by patient: Thread/unthread right pants leg, Thread/unthread left pants leg Pants- Performed by helper: Pull pants up/down Non-skid slipper socks- Performed by patient: Don/doff right sock, Don/doff left sock Non-skid slipper socks- Performed by helper: Don/doff right sock, Don/doff left sock Socks - Performed by patient: Don/doff right sock, Don/doff left sock Socks - Performed by helper:  (sitting balance) Shoes - Performed by patient: Don/doff right shoe Shoes - Performed by helper: Don/doff left shoe, Fasten right, Fasten left AFO - Performed by helper: Don/doff left AFO Assist for footwear: Partial/moderate assist Assist for lower body dressing: Touching or steadying assistance (Pt > 75%)  Function - Toileting Toileting  steps completed by patient: Performs perineal hygiene Toileting  steps completed by helper: Adjust clothing after toileting, Adjust clothing prior to toileting Toileting Assistive Devices: Grab bar or rail Assist level: Touching or steadying assistance (Pt.75%)  Function - Toilet Transfers Toilet transfer assistive device: Elevated toilet seat/BSC over toilet, Grab bar Mechanical lift: Stedy Assist level to toilet: Moderate assist (Pt 50 - 74%/lift or lower) Assist level from toilet: Moderate assist (Pt 50 - 74%/lift or lower) Assist level to bedside commode (at bedside): Maximal assist (Pt 25 - 49%/lift and lower) Assist level from bedside commode (at bedside): Maximal assist (Pt 25 - 49%/lift and lower)  Function - Chair/bed transfer Chair/bed transfer method: Squat pivot Chair/bed transfer assist level: Moderate assist (Pt 50 - 74%/lift or lower) Chair/bed transfer assistive device: Armrests Chair/bed transfer details: Manual facilitation for placement, Verbal cues for technique, Verbal cues for sequencing, Verbal cues for precautions/safety  Function - Locomotion: Wheelchair Will patient use wheelchair at discharge?: Yes Type: Manual Max wheelchair distance: 150 Assist Level: Supervision or verbal cues Assist Level: Supervision or verbal cues Wheel 150 feet activity did not occur: Safety/medical concerns Assist Level: Supervision or verbal cues Turns around,maneuvers to table,bed, and toilet,negotiates 3% grade,maneuvers on rugs and over doorsills: No Function - Locomotion: Ambulation Assistive device: Walker-hemi Max distance: 30 Assist level: Moderate assist (Pt 50 - 74%) Assist level: Moderate assist (Pt 50 - 74%) Walk 50 feet with 2 turns activity did not occur: Safety/medical concerns Assist level: Maximal assist (Pt 25 - 49%) Walk 150 feet activity did not occur: Safety/medical concerns Walk 10 feet on uneven surfaces activity did not occur: Safety/medical concerns  Function - Comprehension Comprehension: Auditory Comprehension  assist level: Understands basic 90% of the time/cues < 10% of the time  Function - Expression Expression: Verbal Expression assist level: Expresses basic 50 - 74% of the time/requires cueing 25 - 49% of the time. Needs to repeat parts of sentences.  Function - Social Interaction Social Interaction assist level: Interacts appropriately 90% of the time - Needs monitoring or encouragement for participation or interaction.  Function - Problem Solving Problem solving assist level: Solves basic 75 - 89% of the time/requires cueing 10 - 24% of the time  Function - Memory Memory assist level: Recognizes or recalls 50 - 74% of the time/requires cueing 25 - 49% of the time Patient normally able to recall (first 3 days only): Current season, Location of own room, Staff names and faces, That he or she is in a hospital    Medical Problem List and Plan: 1. Functional deficits secondary to right MCA infarct with history of prior CVA 2008 as well as 2014, left hemiparesis, now with some finger to thumb opposition LUE      2.  DVT Prophylaxis/Anticoagulation: SCDs. Venous Doppler is negative 3. Pain Management: Tylenol as needed, hemiplegic shoulder pain- sportscreme and Kpad 4. Dysphagia. Dysphagia #2 thin liquid diet. Monitor for any signs of aspiration. Follow-up speech therapy 5. Neuropsych: This patient is capable of making decisions on her own behalf. 6. Skin/Wound Care: Routine skin checks 7. Fluids/Electrolytes/Nutrition: Routine I and Os with follow-up chemistries, ate 60-80%meals yest 8. Hyperlipidemia. Continue Pravachol 9. Hypertension. Norvasc 2.5 mg daily, Cozaar 100 mg daily, HCTZ 12.5 mg daily. Monitor with increased mobility, controlled , monitor orthostatic, 124/75 10. History of asthma. Patient on albuterol inhaler as needed prior to admission 11.  Anemia- Fe studies ok, 2 neg and one pos stool guaic but this was during menstruation, monitor, recheck CBC  12.  Constipation- no BM x 4d,  add senna S LOS (Days) 7 A FACE TO FACE EVALUATION WAS PERFORMED  Daelan Gatt E 06/21/2015, 6:59 AM

## 2015-06-21 NOTE — Progress Notes (Signed)
Social Work Patient ID: Rebecca Yoder, female   DOB: February 19, 1966, 49 y.o.   MRN: [id number]   Have reviewed team conference today with pt and left message for sister.  Pt aware and very agreeable with targeted d/c date of 11/23 and mod i to min assist goals.  Family is able to provide 24/7 assistance.  Medicaid and SSD apps begun yesterday afternoon.  Continue to follow.  Chizara Mena, LCSW

## 2015-06-21 NOTE — Progress Notes (Signed)
Physical Therapy Session Note  Patient Details  Name: Rebecca Yoder MRN: [id number] Date of Birth: [birth year]  Today's Date: 06/21/2015 PT Individual Time: 1510-1610 PT Individual Time Calculation (min): 60 min   Short Term Goals: Week 1:  PT Short Term Goal 1 (Week 1): Pt will initiate stair training during PT session.  PT Short Term Goal 2 (Week 1): Pt will initiate ambulation with HW during PT session.  PT Short Term Goal 3 (Week 1): Pt will demonstrate sit to stand req min A.  PT Short Term Goal 4 (Week 1): Pt will self propel manual w/c x 100' req SBA.  PT Short Term Goal 5 (Week 1): Pt will req mod A for dynamic standing balance activities.   Skilled Therapeutic Interventions/Progress Updates:    Patient supine in bed assist to sit supervision with railing.  Donned shoes and brace dependent.  Patient ambulated to bathroom mod A w/ hemiwalker.  Assist for clothes and balance with toilet transfer.  Patient propels w/c 150' supervision and cues.  Negotiated 8- 3" steps mod/max A one LOB max A to recover prior to stepping up on steps.  Patient needed max cues to step with right leg first and for step to pattern.  Assist for left foot placement descending steps.  Patient transferred to mat via squat pivot min A after w/c set up and cues.  Performed NMR as noted below.  Patient ambulated x 50' x 2 w/ LBQC and mod A cues for left foot placement, for weight shift and posture. Patient well fatigued after session and assisted by her friend to room and left in chair all needs in reach.  Therapy Documentation Precautions:  Precautions Precautions: Fall Precaution Comments: left hemiparesis Restrictions Weight Bearing Restrictions: No Vital Signs: Therapy Vitals Temp: 98.4 F (36.9 C) Temp Source: Oral Pulse Rate: 97 Resp: 18 BP: 110/76 mmHg Patient Position (if appropriate): Sitting Oxygen Therapy SpO2: 99 % O2 Device: Not Delivered Pain: Pain Assessment Pain Assessment:  No/denies pain Pain Score: 6  Pain Type: Acute pain Pain Location: Head Pain Descriptors / Indicators: Headache Pain Onset: Gradual Pain Intervention(s): RN made aware    Other Treatments:   Neuromuscular re education for sit to/from stand transfers with facilitation for weight shift, trunk and head alignment and foot placement as well as for sequencing and timing, sustained motor activation, forced use and for coordination and balance.   See Function Navigator for Current Functional Status.   Therapy/Group: Individual Therapy  [place], [place] 06/21/2015  06/21/2015, 5:16 PM

## 2015-06-21 NOTE — Progress Notes (Signed)
Occupational Therapy Session Note  Patient Details  Name: JEANETTE LINARES MRN: [id number] Date of Birth: 1965/11/02  Today's Date: 06/21/2015 OT Individual Time: 0836-1000 OT Individual Time Calculation (min): 84 min    Short Term Goals: Week 1:  OT Short Term Goal 1 (Week 1): Pt will transfer to shower bench with steady  A squat pivot transfer. OT Short Term Goal 2 (Week 1): Pt will bathe in shower with steady A using long handled sponge. OT Short Term Goal 3 (Week 1): Pt will don shirt with min A    OT Short Term Goal 4 (Week 1): Pt will don pants with min A to steady her balance.   OT Short Term Goal 5 (Week 1): Pt will be able to maintain static standing balance with min A.  Skilled Therapeutic Interventions/Progress Updates:    Pt seen this session to facilitate dynamic balance, integration of LUE in ADLs, and utilization of adaptive techniques with basic ADLs.  Pt requested shower as she had already completed toileting and grooming. Cues and manual facilitation for squat pivot w/c><tub bench with pt demonstrating improved wt shift forward for higher hip clearance. In shower, pt used AE and strategies including lateral lean on bench to wash entire body. Steadying A as pt wt shifted to L to wash buttocks.  Pt able to cross foot over knee to wash feet vs using long sponge. Set up and only 1 verbal cue to don shirt.  Pt continues to need assist to fully pull pants over hips although she is helping 30%. Pt can hold static standing with RUE support with therapist providing L knee support during clothing management. When pt assists using R hand she requires mod-max A to maintain balance. Practiced squat pivot to toilet 2x with min to R and mod to L. Self care completed and pt propelled self to gym. Transferred to mat. Focused on smooth scoots with hip lift down mat to L 4x to improve transfer skills. LUE wt bearing with forward reaching. Grasp/ wrist extension facilitation with cone grasp/ release  exercise. Good finger control but poor wrist active extension. Pt may benefit from wrist cock up splint. kinesiotaping utilized to facilitate wrist extension and to prevent wrist drop. Pt transferred to w/c and propelled herself to her speech therapy office.  Therapy Documentation Precautions:  Precautions Precautions: Fall Precaution Comments: left hemiparesis Restrictions Weight Bearing Restrictions: No    Vital Signs: Therapy Vitals Temp: 98.3 F (36.8 C) Temp Source: Oral Pulse Rate: 87 Resp: 18 BP: 130/70 mmHg Patient Position (if appropriate): Sitting Oxygen Therapy SpO2: 98 % O2 Device: Not Delivered Pain: Pain Assessment Pain Assessment: No/denies pain ADL: ADL ADL Comments: refer to functional navigator  See Function Navigator for Current Functional Status.   Therapy/Group: Individual Therapy  Oluwatamilore Starnes 06/21/2015, 9:35 AM

## 2015-06-21 NOTE — Plan of Care (Signed)
Problem: RH BOWEL ELIMINATION Goal: RH STG MANAGE BOWEL WITH ASSISTANCE STG Manage Bowel with Mod I Assistance.  Outcome: Not Progressing Patient requires laxatives and refuses at times. Orders to receive senna B ID.

## 2015-06-22 ENCOUNTER — Inpatient Hospital Stay (HOSPITAL_COMMUNITY): Payer: Medicaid Other

## 2015-06-22 ENCOUNTER — Inpatient Hospital Stay (HOSPITAL_COMMUNITY): Payer: Medicaid Other | Admitting: Speech Pathology

## 2015-06-22 ENCOUNTER — Inpatient Hospital Stay (HOSPITAL_COMMUNITY): Payer: Self-pay

## 2015-06-22 NOTE — Progress Notes (Signed)
Speech Language Pathology Daily Session Note  Patient Details  Name: Rebecca Yoder MRN: [id number] Date of Birth: 1966/05/17  Today's Date: 06/22/2015 SLP Individual Time: 1430-1530 SLP Individual Time Calculation (min): 60 min  Short Term Goals: Week 2: SLP Short Term Goal 1 (Week 2): Pt will utilize increased vocal intensity, pacing, and overarticulation to achieve intelligibility in sentences with supervision cues.  SLP Short Term Goal 2 (Week 2): Pt will return demonstration of at least 2 safety precautions during functional tasks over 3 targeted sessions with supervision.  SLP Short Term Goal 3 (Week 2): Pt will consume dys 3 textures and thin liquids with mod I to monitor and correct left sided buccal residue over 2 targeted sessions prior to diet advancement.   SLP Short Term Goal 4 (Week 2): Pt will consume regular textures with supervision cues to monitor and correct left sided buccal residue over 2 targeted session prior to diet advancement..    Skilled Therapeutic Interventions: Skilled treatment session focused on addressing dysphagia and dysarthria goals. SLP facilitated session by providing setup of regular textures and thin liquids via straw with intermittent left sided oral reside present post swallow.  SLP provided Supervision level verbal cues to increase awareness which in turn enabled patient to correct/manage; sensory deficits/inattention appear to be most limiting factor for advancement.  SLP also facilitated session with a semi-structured speech task that required patient to utilize over articulation and pacing with Min multimodal ceus for ~80% at the sentence-conversational speech level.  Recommend continuation of regular texture trials with SLP and visual feedback, maybe from a mirror prior to diet advancement.  Continue with current plan of care.    Function:  Eating Eating   Modified Consistency Diet: No Eating Assist Level: Supervision or verbal cues   Eating  Set Up Assist For: Opening containers       Cognition Comprehension Comprehension assist level: Follows basic conversation/direction with extra time/assistive device  Expression   Expression assist level: Expresses basic 75 - 89% of the time/requires cueing 10 - 24% of the time. Needs helper to occlude trach/needs to repeat words.  Social Interaction Social Interaction assist level: Interacts appropriately with others with medication or extra time (anti-anxiety, antidepressant).  Problem Solving Problem solving assist level: Solves basic 75 - 89% of the time/requires cueing 10 - 24% of the time  Memory Memory assist level: Recognizes or recalls 75 - 89% of the time/requires cueing 10 - 24% of the time    Pain Pain Assessment Pain Assessment: No/denies pain  Therapy/Group: Individual Therapy  Carmelia Roller., CCC-SLP [id number]  [place] 06/22/2015, 3:30 PM

## 2015-06-22 NOTE — Progress Notes (Signed)
Patient ID: Rebecca Yoder, female   DOB: 09-02-1965, 49 y.o.   MRN: [id number]   Subjective/Complaints: Pt sneezing, states she has "allergies" took claritin at home, no cough ROS:  - CP, -SOB, less Left shoulder pain today  Objective: Vital Signs: Blood pressure 116/79, pulse 84, temperature 98.6 F (37 C), temperature source Oral, resp. rate 18, height 5' 4.5" (1.638 m), weight 83.1 kg (183 lb 3.2 oz), last menstrual period 06/12/2015, SpO2 100 %. Dg Shoulder 1v Left  06/20/2015  CLINICAL DATA:  Single AP view of the left shoulder, status post recent CVA, tingling and numbness in the fingers associated with left shoulder pain since October 31st. EXAM: LEFT SHOULDER - 1 VIEW COMPARISON:  None impacts FINDINGS: The bones of the left shoulder are adequately mineralized. The joint spaces are reasonably well-maintained. There is no acute fracture nor dislocation. The observed portions of the left clavicle and upper left ribs are normal. The bony structures exhibit no acute abnormalities. IMPRESSION: There is no acute bony abnormality of the left shoulder. Electronically Signed   By: David  Martinique M.D.   On: 06/20/2015 08:05   No results found for this or any previous visit (from the past 72 hour(s)).   HEENT: normal Cardio: RRR Resp: CTA B/L and unlabored GI: BS positive and NT, ND Extremity:  No Edema Skin:   Intact Neuro: Alert/Oriented, Abnormal Sensory reduced in Left foot, Abnormal Motor 3- Left delt bi tri grip,as well as finger ext 3- L HF, 2- L KE, ankle, Abnormal FMC Ataxic/ dec FMC and Tone  Hypertonia, Tone:  Hypertonia and Inattention Musc/Skel:  Other no pain with UE or LE ROM   Assessment/Plan: 1. Functional deficits secondary to RIght MCA infarct which require 3+ hours per day of interdisciplinary therapy in a comprehensive inpatient rehab setting. Physiatrist is providing close team supervision and 24 hour management of active medical problems listed below. Physiatrist and  rehab team continue to assess barriers to discharge/monitor patient progress toward functional and medical goals.  FIM: Function - Bathing Position: Shower Body parts bathed by patient: Right arm, Left arm, Chest, Abdomen, Front perineal area, Right upper leg, Left upper leg, Right lower leg, Left lower leg, Back, Buttocks Body parts bathed by helper: Buttocks Assist Level: Touching or steadying assistance(Pt > 75%)  Function- Upper Body Dressing/Undressing What is the patient wearing?: Pull over shirt/dress Pull over shirt/dress - Perfomed by patient: Thread/unthread right sleeve, Thread/unthread left sleeve, Put head through opening, Pull shirt over trunk Pull over shirt/dress - Perfomed by helper: Pull shirt over trunk, Thread/unthread left sleeve Assist Level: Supervision or verbal cues, Set up Function - Lower Body Dressing/Undressing What is the patient wearing?: Underwear, Pants, Socks, Shoes, AFO Position: Wheelchair/chair at sink Underwear - Performed by patient: Thread/unthread right underwear leg, Thread/unthread left underwear leg Underwear - Performed by helper: Pull underwear up/down Pants- Performed by patient: Thread/unthread right pants leg, Thread/unthread left pants leg Pants- Performed by helper: Pull pants up/down Non-skid slipper socks- Performed by patient: Don/doff right sock, Don/doff left sock Non-skid slipper socks- Performed by helper: Don/doff right sock, Don/doff left sock Socks - Performed by patient: Don/doff right sock, Don/doff left sock Socks - Performed by helper:  (sitting balance) Shoes - Performed by patient: Don/doff right shoe Shoes - Performed by helper: Don/doff left shoe, Fasten right, Fasten left AFO - Performed by helper: Don/doff left AFO Assist for footwear: Partial/moderate assist Assist for lower body dressing: Touching or steadying assistance (Pt > 75%)  Function -  Toileting Toileting steps completed by patient: Performs perineal  hygiene Toileting steps completed by helper: Adjust clothing after toileting, Adjust clothing prior to toileting Toileting Assistive Devices: Grab bar or rail Assist level: Touching or steadying assistance (Pt.75%)  Function - Toilet Transfers Toilet transfer assistive device: Elevated toilet seat/BSC over toilet, Grab bar, Walker Mechanical lift: Stedy Assist level to toilet: Touching or steadying assistance (Pt > 75%) Assist level from toilet: Touching or steadying assistance (Pt > 75%) Assist level to bedside commode (at bedside): Maximal assist (Pt 25 - 49%/lift and lower) Assist level from bedside commode (at bedside): Maximal assist (Pt 25 - 49%/lift and lower)  Function - Chair/bed transfer Chair/bed transfer method: Squat pivot Chair/bed transfer assist level: Touching or steadying assistance (Pt > 75%) Chair/bed transfer assistive device: Armrests Chair/bed transfer details: Verbal cues for safe use of DME/AE, Verbal cues for technique  Function - Locomotion: Wheelchair Will patient use wheelchair at discharge?: Yes Type: Manual Max wheelchair distance: 150 Assist Level: Supervision or verbal cues Assist Level: Supervision or verbal cues Wheel 150 feet activity did not occur: Safety/medical concerns Assist Level: Supervision or verbal cues Turns around,maneuvers to table,bed, and toilet,negotiates 3% grade,maneuvers on rugs and over doorsills: No Function - Locomotion: Ambulation Assistive device: Cane-quad Max distance: 50 Assist level: Moderate assist (Pt 50 - 74%) Assist level: Moderate assist (Pt 50 - 74%) Walk 50 feet with 2 turns activity did not occur: Safety/medical concerns Assist level: Moderate assist (Pt 50 - 74%) Walk 150 feet activity did not occur: Safety/medical concerns Walk 10 feet on uneven surfaces activity did not occur: Safety/medical concerns  Function - Comprehension Comprehension: Auditory Comprehension assist level: Understands basic 90% of  the time/cues < 10% of the time  Function - Expression Expression: Verbal Expression assist level: Expresses basic 50 - 74% of the time/requires cueing 25 - 49% of the time. Needs to repeat parts of sentences.  Function - Social Interaction Social Interaction assist level: Interacts appropriately with others with medication or extra time (anti-anxiety, antidepressant).  Function - Problem Solving Problem solving assist level: Solves basic 75 - 89% of the time/requires cueing 10 - 24% of the time  Function - Memory Memory assist level: Recognizes or recalls 50 - 74% of the time/requires cueing 25 - 49% of the time Patient normally able to recall (first 3 days only): Current season, Location of own room, Staff names and faces, That he or she is in a hospital    Medical Problem List and Plan: 1. Functional deficits secondary to right MCA infarct with history of prior CVA 2008 as well as 2014, left hemiparesis, now with some finger to thumb opposition LUE      2.  DVT Prophylaxis/Anticoagulation: SCDs. Venous Doppler is negative 3. Pain Management: Tylenol as needed, hemiplegic shoulder pain- sportscreme and Kpad 4. Dysphagia. Dysphagia #2 thin liquid diet. Monitor for any signs of aspiration. Follow-up speech therapy 5. Neuropsych: This patient is capable of making decisions on her own behalf. 6. Skin/Wound Care: Routine skin checks 7. Fluids/Electrolytes/Nutrition: Routine I and Os with follow-up chemistries, appetite overall improving 8. Hyperlipidemia. Continue Pravachol 9. Hypertension. Norvasc 2.5 mg daily, Cozaar 100 mg daily, HCTZ 12.5 mg daily. Monitor with increased mobility, controlled , monitor orthostatic, 116/79 10. History of asthma. Patient on albuterol inhaler as needed prior to admission 11.  Anemia- Fe studies ok, 2 neg and one pos stool guaic but this was during menstruation, monitor,  12.  Constipation- BM yesterday, added senna S LOS (Days)  [place] EVALUATION  WAS PERFORMED  Joselynn Amoroso E 06/22/2015, 7:13 AM

## 2015-06-22 NOTE — Progress Notes (Signed)
Physical Therapy Weekly Progress Note  Patient Details  Name: Rebecca Yoder MRN: [id number] Date of Birth: 1966/06/24  Beginning of progress report period: June 15, 2015 End of progress report period: June 22, 2015  Today's Date: 06/22/2015 PT Individual Time: 1300-1400 PT Individual Time Calculation (min): 60 min   Patient has met 3 of 5 short term goals.  Patient progressing with initiation of gait and stair training this week as well as progressing to consistent min A for bed<>w/c transfers.  She continues to be inconsistent, however, with sit to stand due to poor awareness of L LE and inconsistency with set up as well as some impulsivity.  Patient also demonstrates dynamic standing balance with mod to occasional max A due to poor left side awareness and impulsivity.   Patient continues to demonstrate the following deficits: L hemiparesis, decreased L side awareness, decreased safety/impulsivity, decreased balance, decreased coordination, decreased sequencing and timing and motor impersistence and therefore will continue to benefit from skilled PT intervention to enhance overall performance with activity tolerance, balance, postural control, functional use of  left upper extremity and left lower extremity, attention, awareness and coordination.  Patient progressing toward long term goals..  Continue plan of care.  PT Short Term Goals Week 1:  PT Short Term Goal 1 (Week 1): Pt will initiate stair training during PT session.  PT Short Term Goal 1 - Progress (Week 1): Met PT Short Term Goal 2 (Week 1): Pt will initiate ambulation with HW during PT session.  PT Short Term Goal 2 - Progress (Week 1): Met PT Short Term Goal 3 (Week 1): Pt will demonstrate sit to stand req min A.  PT Short Term Goal 3 - Progress (Week 1): Partly met PT Short Term Goal 4 (Week 1): Pt will self propel manual w/c x 100' req SBA.  PT Short Term Goal 4 - Progress (Week 1): Met PT Short Term Goal 5 (Week  1): Pt will req mod A for dynamic standing balance activities.  PT Short Term Goal 5 - Progress (Week 1): Partly met Week 2:  PT Short Term Goal 1 (Week 2): Patient to ambulate with QC 75' min/mod A. PT Short Term Goal 2 (Week 2): Patient to transfer consistently with min A sit to stand and bed<>w/c. PT Short Term Goal 3 (Week 2): Patient to be independent with w/c set up prior to transfers with min questioning cues. PT Short Term Goal 4 (Week 2): Patient to negotiate 4 steps with rail mod A PT Short Term Goal 5 (Week 2): Patient to perform standing dynamic activities with min A at least 50% of the time.  Skilled Therapeutic Interventions/Progress Updates:    Patient seen for 1:1 session with focus on gait/balance and neuromuscular re education for postural control and body awareness.  Patient in w/c propelled to laundry facility with supervision and increased time.  Stood initially mod A with posterior LOB due to improper left foot placement.  Aided pt to chair, then educated and assisted with correct foot placement for sit to stand min A and pt stabilized on washer, then placed clothing in washer leaning hips for stability against machine.  Propelled to therapy gym for work on gait with [facility] and L AFO x 50' w/ 2 turns with mod A cues for weight shift, sequencing, facilitation for L hip protraction.  Attempted gait through floor ladder but pt with difficulty with balance even with HHA, so performed in parallel bars mod A and max cues due  to pt impulsive and stepping with L leaving hip behind with LOB at times catching herself with hand on parallel bar.  Patient also inconsistently stepping two feet versus one foot per square.  Transferred to mat via squat pivot min A and cues.  Seated on mat for NMR as noted below.  Patient back to chair and assisted back to room due to time constraints and all needs left in reach.  Reminded pt to check laundry during next therapy session today.  Therapy  Documentation Precautions:  Precautions Precautions: Fall Precaution Comments: left hemiparesis, decr L awareness, impulsive Restrictions Weight Bearing Restrictions: No Pain: Pain Assessment Pain Assessment: No/denies pain Other Treatment: Neuromuscular re education seated on mat with visual demo and mirror for feedback performing lateral weight shifts and reciprocal scooting with mod facilitation and cues for decreased shoulder activation and increased pelvic stabilization and upright posture with work on sequencing and timing, motor coordination, weight shift and body awareness.     See Function Navigator for Current Functional Status.  Therapy/Group: Individual Therapy  WYNN,CYNDI 06/22/2015, 2:36 PM  Magda Kiel, [place] 06/22/2015

## 2015-06-22 NOTE — Progress Notes (Signed)
Occupational Therapy Weekly Progress Note  Patient Details  Name: Rebecca Yoder MRN: [id number] Date of Birth: 1966-07-08  Beginning of progress report period: June 15, 2015 End of progress report period: June 22, 2015  Today's Date: 06/22/2015 OT Individual Time: 7341-9379 OT Individual Time Calculation (min): 75 min    Patient has met 3 of 5 short term goals.  Pt is making steady progress with performance of self-care complicated by left UE hemiparesis and impaired memory effecting carry-over.  Patient continues to demonstrate the following deficits: Left UE hemiparesis, impaired standing balance, impaired Rebecca Yoder of left hand, general weakness, impaired transfers, cognitive impairment and therefore will continue to benefit from skilled OT intervention to enhance overall performance with BADL.  Patient progressing toward long term goals..  Continue plan of care.  OT Short Term Goals Week 1:  OT Short Term Goal 1 (Week 1): Pt will transfer to shower bench with steady  A squat pivot transfer. OT Short Term Goal 1 - Progress (Week 1): Met OT Short Term Goal 2 (Week 1): Pt will bathe in shower with steady A using long handled sponge. OT Short Term Goal 2 - Progress (Week 1): Met OT Short Term Goal 3 (Week 1): Pt will don shirt with min A    OT Short Term Goal 3 - Progress (Week 1): Progressing toward goal OT Short Term Goal 4 (Week 1): Pt will don pants with min A to steady her balance.   OT Short Term Goal 4 - Progress (Week 1): Progressing toward goal OT Short Term Goal 5 (Week 1): Pt will be able to maintain static standing balance with min A. OT Short Term Goal 5 - Progress (Week 1): Met Week 2:  OT Short Term Goal 1 (Week 2): Pt will don shirt with min A    OT Short Term Goal 2 (Week 2): Pt will don pants with min A to steady her balance.   OT Short Term Goal 3 (Week 2): Pt will don bra with Mod A OT Short Term Goal 4 (Week 2): Pt will complete toileting with min assist to  manage clothing OT Short Term Goal 5 (Week 2): Pt will incorporate LUE as gross assist during selected dressing task  Skilled Therapeutic Interventions/Progress Updates: ADL-retraining at shower level with focus on improved dressing skills, transfers, and static standing balance during assisted lower body dressing.   Pt received seated in recliner awaiting therapist for planned BADL session.   With mod instructional cues, pt accepts role in directing caregiver with assisting transfers.   Pt provides direction for approx 75% of transfer but requires multimodal cues to attend to placement of feet during transfer.   Pt performs stand pivot transfer to w/c with overall mod assist d/t low seat at recliner.   Pt is escorted to bathroom and completes squat pivot transfer to tub bench with only steadying assist after re-ed on technique.   Pt bathes unassisted to include washing her buttocks thoroughly using anterior lean and reach from posterior.    Pt returns to w/c and dresses at sink side maintaining static balance for 3 minutes during assisted dressing with steadying assist to correct during her first two attempts to stand.   OT advised pt to request bra from family to enhance practice dressing during the following week.       Therapy Documentation Precautions:  Precautions Precautions: Fall Precaution Comments: left hemiparesis Restrictions Weight Bearing Restrictions: No   Pain: Pain Assessment Pain Assessment: No/denies pain  ADL: ADL ADL Comments: refer to functional navigator  See Function Navigator for Current Functional Status.   Therapy/Group: Individual Therapy  Rebecca Yoder 06/22/2015, 12:32 PM

## 2015-06-23 ENCOUNTER — Inpatient Hospital Stay (HOSPITAL_COMMUNITY): Payer: Self-pay

## 2015-06-23 DIAGNOSIS — R51 Headache: Secondary | ICD-10-CM

## 2015-06-23 LAB — OCCULT BLOOD X 1 CARD TO LAB, STOOL: FECAL OCCULT BLD: POSITIVE — AB

## 2015-06-23 MED ORDER — HYDROCODONE-ACETAMINOPHEN 5-325 MG PO TABS
1.0000 | ORAL_TABLET | Freq: Four times a day (QID) | ORAL | Status: DC | PRN
  Administered 2015-06-23 – 2015-07-04 (×21): 1 via ORAL
  Filled 2015-06-23 (×23): qty 1

## 2015-06-23 NOTE — Progress Notes (Signed)
Patient called for bathroom, NT responded; patient in pain from headache, NT called RN for medication and for help getting patient back to bed with stedy. RN entered, called for second RN to help get patient in bed. Patient in 10/10 pain and hard to get response from pain.  Vitals taken. Rapid response called and responded. MD called, obtained order for hydrocodone-acetaminophen 5-325 mg 1 tablet q6h. BP rechecked prior to giving pain medication. Will continue to monitor patient.

## 2015-06-23 NOTE — Progress Notes (Signed)
Occupational Therapy Session Note  Patient Details  Name: Rebecca Yoder MRN: [id number] Date of Birth: 07/04/1966  Today's Date: 06/23/2015 OT Individual Time: 1345-1430 OT Individual Time Calculation (min): 45 min    Short Term Goals: Week 2:  OT Short Term Goal 1 (Week 2): Pt will don shirt with min A    OT Short Term Goal 2 (Week 2): Pt will don pants with min A to steady her balance.   OT Short Term Goal 3 (Week 2): Pt will don bra with Mod A OT Short Term Goal 4 (Week 2): Pt will complete toileting with min assist to manage clothing OT Short Term Goal 5 (Week 2): Pt will incorporate LUE as gross assist during selected dressing task  Skilled Therapeutic Interventions/Progress Updates:   OT session focused on functional transfers, standing balance, safety awareness, and ADL retraining. Pt received sitting in w/c with sister present. Completed squat pivot transfers w/c<>TTB (walk-in shower) and sit<>stand multiple times throughout session with min assist and moderate cues for attention to positioning of LLE. Completed bathing at shower level with minimal cues for safety and problem solving. Completed dressing sit<>stand with min cues for hemi dressing technique. Pt completed 70% of donning sports bra today, requiring assist to pull down. Pt left sitting in w/c with QRB donned and lap tray positioned. Family present and all needs in reach.   Therapy Documentation Precautions:  Precautions Precautions: Fall Precaution Comments: left hemiparesis, decr L awareness, impulsive Restrictions Weight Bearing Restrictions: No General:   Vital Signs: Therapy Vitals Temp: 98 F (36.7 C) Temp Source: Oral Pulse Rate: 95 Resp: 18 BP: 122/78 mmHg Patient Position (if appropriate): Sitting Oxygen Therapy SpO2: 99 % O2 Device: Not Delivered Pain: Pain Assessment Pain Assessment: 0-10 Pain Score: 6  Pain Type: Acute pain Pain Location: Head Pain Descriptors / Indicators: Aching Pain  Frequency: Intermittent Pain Onset: On-going Pain Intervention(s): Medication (See eMAR) ADL:  See Function Navigator for Current Functional Status.   Therapy/Group: Individual Therapy  Duayne Cal 06/23/2015, 2:35 PM

## 2015-06-23 NOTE — Progress Notes (Signed)
Patient ID: Rebecca Yoder, female   DOB: September 30, 1965, 49 y.o.   MRN: [id number]   Subjective/Complaints: Headache mainly frontal last noc, no other issues  ROS:  - CP, -SOB, - Joint pains  Objective: Vital Signs: Blood pressure 130/98, pulse 89, temperature 98 F (36.7 C), temperature source Oral, resp. rate 18, height 5' 4.5" (1.638 m), weight 83.1 kg (183 lb 3.2 oz), last menstrual period 06/12/2015, SpO2 99 %. No results found. Results for orders placed or performed during the hospital encounter of 06/14/15 (from the past 72 hour(s))  Occult blood card to lab, stool RN will collect     Status: Abnormal   Collection Time: 06/23/15  8:06 AM  Result Value Ref Range   Fecal Occult Bld POSITIVE (A) NEGATIVE     HEENT: normal Cardio: RRR Resp: CTA B/L and unlabored GI: BS positive and NT, ND Extremity:  No Edema Skin:   Intact Neuro: Alert/Oriented, Abnormal Sensory reduced in Left foot, Abnormal Motor 3- Left delt bi tri grip, 3- L HF, 2- L KE, ankle, Abnormal FMC Ataxic/ dec FMC and Tone  Hypertonia, Tone:  Hypertonia and Inattention Musc/Skel:  Other no pain with UE or LE ROM, Tenderness over R trap   Assessment/Plan: 1. Functional deficits secondary to RIght MCA infarct which require 3+ hours per day of interdisciplinary therapy in a comprehensive inpatient rehab setting. Physiatrist is providing close team supervision and 24 hour management of active medical problems listed below. Physiatrist and rehab team continue to assess barriers to discharge/monitor patient progress toward functional and medical goals.  FIM: Function - Bathing Position: Shower Body parts bathed by patient: Right arm, Left arm, Chest, Abdomen, Front perineal area, Buttocks, Right upper leg, Left upper leg, Right lower leg, Left lower leg Body parts bathed by helper: Buttocks Assist Level: Supervision or verbal cues  Function- Upper Body Dressing/Undressing What is the patient wearing?: Pull over  shirt/dress Pull over shirt/dress - Perfomed by patient: Thread/unthread right sleeve, Put head through opening Pull over shirt/dress - Perfomed by helper: Thread/unthread left sleeve, Pull shirt over trunk Assist Level: Touching or steadying assistance(Pt > 75%) Function - Lower Body Dressing/Undressing What is the patient wearing?: Pants, Non-skid slipper socks, Shoes Position: Wheelchair/chair at sink Underwear - Performed by patient: Thread/unthread right underwear leg, Thread/unthread left underwear leg Underwear - Performed by helper: Pull underwear up/down Pants- Performed by patient: Thread/unthread right pants leg, Thread/unthread left pants leg Pants- Performed by helper: Pull pants up/down Non-skid slipper socks- Performed by patient: Don/doff right sock Non-skid slipper socks- Performed by helper: Don/doff left sock Socks - Performed by patient: Don/doff right sock, Don/doff left sock Socks - Performed by helper:  (sitting balance) Shoes - Performed by patient: Don/doff right shoe Shoes - Performed by helper: Don/doff right shoe, Fasten right AFO - Performed by helper: Don/doff left AFO Assist for footwear: Partial/moderate assist Assist for lower body dressing: Touching or steadying assistance (Pt > 75%)  Function - Toileting Toileting steps completed by patient: Performs perineal hygiene Toileting steps completed by helper: Adjust clothing after toileting, Adjust clothing prior to toileting Toileting Assistive Devices: Grab bar or rail Assist level: Touching or steadying assistance (Pt.75%)  Function - Air cabin crew transfer assistive device: Elevated toilet seat/BSC over toilet, Grab bar, Walker Mechanical lift: Stedy Assist level to toilet: Moderate assist (Pt 50 - 74%/lift or lower) Assist level from toilet: Moderate assist (Pt 50 - 74%/lift or lower) Assist level to bedside commode (at bedside): Maximal assist (Pt 25 -  49%/lift and lower) Assist level from  bedside commode (at bedside): Maximal assist (Pt 25 - 49%/lift and lower)  Function - Chair/bed transfer Chair/bed transfer method: Squat pivot Chair/bed transfer assist level: Touching or steadying assistance (Pt > 75%) Chair/bed transfer assistive device: Armrests Chair/bed transfer details: Verbal cues for safe use of DME/AE, Verbal cues for technique, Manual facilitation for placement  Function - Locomotion: Wheelchair Will patient use wheelchair at discharge?: Yes Type: Manual Max wheelchair distance: 150 Assist Level: Supervision or verbal cues Assist Level: Supervision or verbal cues Wheel 150 feet activity did not occur: Safety/medical concerns Assist Level: Supervision or verbal cues Turns around,maneuvers to table,bed, and toilet,negotiates 3% grade,maneuvers on rugs and over doorsills: No Function - Locomotion: Ambulation Assistive device: Cane-quad Max distance: 50 Assist level: Moderate assist (Pt 50 - 74%) Assist level: Moderate assist (Pt 50 - 74%) Walk 50 feet with 2 turns activity did not occur: Safety/medical concerns Assist level: Moderate assist (Pt 50 - 74%) Walk 150 feet activity did not occur: Safety/medical concerns Walk 10 feet on uneven surfaces activity did not occur: Safety/medical concerns  Function - Comprehension Comprehension: Auditory Comprehension assist level: Understands basic 90% of the time/cues < 10% of the time  Function - Expression Expression: Verbal Expression assist level: Expresses basic 50 - 74% of the time/requires cueing 25 - 49% of the time. Needs to repeat parts of sentences.  Function - Social Interaction Social Interaction assist level: Interacts appropriately with others with medication or extra time (anti-anxiety, antidepressant).  Function - Problem Solving Problem solving assist level: Solves basic 75 - 89% of the time/requires cueing 10 - 24% of the time  Function - Memory Memory assist level: Recognizes or recalls 50  - 74% of the time/requires cueing 25 - 49% of the time Patient normally able to recall (first 3 days only): Current season, Location of own room, Staff names and faces, That he or she is in a hospital    Medical Problem List and Plan: 1. Functional deficits secondary to right MCA infarct with history of prior CVA 2008 as well as 2014, left hemiparesis, now with some finger to thumb opposition LUE      2.  DVT Prophylaxis/Anticoagulation: SCDs. Venous Doppler is negative 3. Pain Management: Tylenol as needed, hemiplegic shoulder pain- sportscreme and Kpad, post CVA headache no neuro exam changes, has some trap pain as well which may contribute to HA 4. Dysphagia. Dysphagia #2 thin liquid diet. Monitor for any signs of aspiration. Follow-up speech therapy 5. Neuropsych: This patient is capable of making decisions on her own behalf. 6. Skin/Wound Care: Routine skin checks 7. Fluids/Electrolytes/Nutrition: Routine I and Os with follow-up chemistries, ate 60-80%meals yest 8. Hyperlipidemia. Continue Pravachol 9. Hypertension. Norvasc 2.5 mg daily, Cozaar 100 mg daily, HCTZ 12.5 mg daily. Monitor with increased mobility, controlled , monitor orthostatic, 124/75 10. History of asthma. Patient on albuterol inhaler as needed prior to admission 11.  Anemia- Fe studies ok, 2 neg and one pos stool guaic but this was during menstruation, monitor, recheck CBC 12.  Constipation- no BM x 4d, add senna S LOS (Days) 9 A FACE TO FACE EVALUATION WAS PERFORMED  Ismail Graziani E 06/23/2015, 9:01 AM

## 2015-06-24 ENCOUNTER — Inpatient Hospital Stay (HOSPITAL_COMMUNITY): Payer: Medicaid Other | Admitting: Physical Therapy

## 2015-06-24 DIAGNOSIS — D62 Acute posthemorrhagic anemia: Secondary | ICD-10-CM

## 2015-06-24 LAB — CBC
HEMATOCRIT: 36.9 % (ref 36.0–46.0)
HEMOGLOBIN: 12 g/dL (ref 12.0–15.0)
MCH: 29.3 pg (ref 26.0–34.0)
MCHC: 32.5 g/dL (ref 30.0–36.0)
MCV: 90 fL (ref 78.0–100.0)
PLATELETS: 462 10*3/uL — AB (ref 150–400)
RBC: 4.1 MIL/uL (ref 3.87–5.11)
RDW: 13.9 % (ref 11.5–15.5)
WBC: 6.3 10*3/uL (ref 4.0–10.5)

## 2015-06-24 NOTE — Progress Notes (Signed)
Physical Therapy Session Note  Patient Details  Name: Rebecca Yoder MRN: [id number] Date of Birth: 1965-09-04  Today's Date: 06/24/2015 PT Individual Time: 1300-1400 PT Individual Time Calculation (min): 60 min   Short Term Goals: Week 1:  PT Short Term Goal 1 (Week 1): Pt will initiate stair training during PT session.  PT Short Term Goal 1 - Progress (Week 1): Met PT Short Term Goal 2 (Week 1): Pt will initiate ambulation with HW during PT session.  PT Short Term Goal 2 - Progress (Week 1): Met PT Short Term Goal 3 (Week 1): Pt will demonstrate sit to stand req min A.  PT Short Term Goal 3 - Progress (Week 1): Partly met PT Short Term Goal 4 (Week 1): Pt will self propel manual w/c x 100' req SBA.  PT Short Term Goal 4 - Progress (Week 1): Met PT Short Term Goal 5 (Week 1): Pt will req mod A for dynamic standing balance activities.  PT Short Term Goal 5 - Progress (Week 1): Partly met  Skilled Therapeutic Interventions/Progress Updates:  Pt was seen bedside in the pm. Pt propelled w/c 150 feet x 2 with R UE and LE, requires S and verbal cues. Pt performed multiple sit to stand transfers with min A and verbal cues. Pt performed stand pivot transfers with [facility], from w/c to edge of mat, edge of mat to w/c x 2 with min to mod A and verbal cues. Pt ambulated 72 feet x 2 with WBQC, L GRAFO, and min to mod A with verbal cues. Pt requires requires occasional verbal cues to redirect and remain focused on task at hand. Pt performed blocked practice, sit to stand transfers x 5 with min A and verbal cues. Pt returned to room and left sitting up in w/c with family at bedside.   Therapy Documentation Precautions:  Precautions Precautions: Fall Precaution Comments: left hemiparesis, decr L awareness, impulsive Restrictions Weight Bearing Restrictions: No General:   Pain: No c/o pain.   See Function Navigator for Current Functional Status.   Therapy/Group: Individual Therapy  Dub Amis 06/24/2015, 2:16 PM

## 2015-06-24 NOTE — Progress Notes (Signed)
Patient ID: Rebecca Yoder, female   DOB: [birth year], 49 y.o.   MRN: [id number]   Subjective/Complaints: Pt with mild HA last noc relieved with Kpad Concerned about "low BP" No dizziness  ROS:  - CP, -SOB, - Joint pains  Objective: Vital Signs: Blood pressure 122/78, pulse 95, temperature 98 F (36.7 C), temperature source Oral, resp. rate 18, height 5' 4.5" (1.638 m), weight 83.1 kg (183 lb 3.2 oz), last menstrual period 06/12/2015, SpO2 99 %. No results found. Results for orders placed or performed during the hospital encounter of 06/14/15 (from the past 72 hour(s))  Occult blood card to lab, stool RN will collect     Status: Abnormal   Collection Time: 06/23/15  8:06 AM  Result Value Ref Range   Fecal Occult Bld POSITIVE (A) NEGATIVE     HEENT: normal Cardio: RRR Resp: CTA B/L and unlabored GI: BS positive and NT, ND Extremity:  No Edema Skin:   Intact Neuro: Alert/Oriented, Abnormal Sensory reduced in Left foot, Abnormal Motor 3- Left delt bi tri grip, 3- L HF, 2- L KE, ankle, Abnormal FMC Ataxic/ dec FMC and Tone  Hypertonia, Tone:  Hypertonia and Inattention Musc/Skel:  Other no pain with UE or LE ROM, Tenderness over R trap   Assessment/Plan: 1. Functional deficits secondary to RIght MCA infarct which require 3+ hours per day of interdisciplinary therapy in a comprehensive inpatient rehab setting. Physiatrist is providing close team supervision and 24 hour management of active medical problems listed below. Physiatrist and rehab team continue to assess barriers to discharge/monitor patient progress toward functional and medical goals.  FIM: Function - Bathing Position: Shower Body parts bathed by patient: Right arm, Left arm, Chest, Abdomen, Front perineal area, Right upper leg, Left upper leg, Right lower leg, Left lower leg Body parts bathed by helper: Buttocks Assist Level: Touching or steadying assistance(Pt > 75%)  Function- Upper Body Dressing/Undressing What  is the patient wearing?: Pull over shirt/dress, Bra Bra - Perfomed by patient: Thread/unthread right bra strap, Thread/unthread left bra strap Bra - Perfomed by helper: Hook/unhook bra (pull down sports bra) Pull over shirt/dress - Perfomed by patient: Thread/unthread right sleeve, Put head through opening, Thread/unthread left sleeve, Pull shirt over trunk Pull over shirt/dress - Perfomed by helper: Thread/unthread left sleeve, Pull shirt over trunk Assist Level: Touching or steadying assistance(Pt > 75%) Function - Lower Body Dressing/Undressing What is the patient wearing?: Pants, Non-skid slipper socks, Shoes, Underwear Position: Wheelchair/chair at sink Underwear - Performed by patient: Thread/unthread right underwear leg, Thread/unthread left underwear leg Underwear - Performed by helper: Pull underwear up/down Pants- Performed by patient: Thread/unthread right pants leg, Thread/unthread left pants leg Pants- Performed by helper: Pull pants up/down Non-skid slipper socks- Performed by patient: Don/doff right sock Non-skid slipper socks- Performed by helper: Don/doff left sock Socks - Performed by patient: Don/doff right sock, Don/doff left sock Socks - Performed by helper:  (sitting balance) Shoes - Performed by patient: Don/doff right shoe Shoes - Performed by helper: Don/doff right shoe, Fasten right AFO - Performed by helper: Don/doff left AFO Assist for footwear: Partial/moderate assist Assist for lower body dressing: Touching or steadying assistance (Pt > 75%)  Function - Toileting Toileting steps completed by patient: Performs perineal hygiene Toileting steps completed by helper: Adjust clothing prior to toileting, Performs perineal hygiene Toileting Assistive Devices: Grab bar or rail Assist level: Touching or steadying assistance (Pt.75%)  Function - Toilet Transfers Toilet transfer assistive device: Elevated toilet seat/BSC over toilet, Grab bar,  Walker Mechanical lift:  Stedy Assist level to toilet: Moderate assist (Pt 50 - 74%/lift or lower) Assist level from toilet: Moderate assist (Pt 50 - 74%/lift or lower) Assist level to bedside commode (at bedside): Maximal assist (Pt 25 - 49%/lift and lower) Assist level from bedside commode (at bedside): Maximal assist (Pt 25 - 49%/lift and lower)  Function - Chair/bed transfer Chair/bed transfer method: Squat pivot Chair/bed transfer assist level: Touching or steadying assistance (Pt > 75%) Chair/bed transfer assistive device: Armrests Chair/bed transfer details: Verbal cues for safe use of DME/AE, Verbal cues for technique, Manual facilitation for placement  Function - Locomotion: Wheelchair Will patient use wheelchair at discharge?: Yes Type: Manual Max wheelchair distance: 150 Assist Level: Supervision or verbal cues Assist Level: Supervision or verbal cues Wheel 150 feet activity did not occur: Safety/medical concerns Assist Level: Supervision or verbal cues Turns around,maneuvers to table,bed, and toilet,negotiates 3% grade,maneuvers on rugs and over doorsills: No Function - Locomotion: Ambulation Assistive device: Cane-quad Max distance: 50 Assist level: Moderate assist (Pt 50 - 74%) Assist level: Moderate assist (Pt 50 - 74%) Walk 50 feet with 2 turns activity did not occur: Safety/medical concerns Assist level: Moderate assist (Pt 50 - 74%) Walk 150 feet activity did not occur: Safety/medical concerns Walk 10 feet on uneven surfaces activity did not occur: Safety/medical concerns  Function - Comprehension Comprehension: Auditory Comprehension assist level: Understands basic 90% of the time/cues < 10% of the time  Function - Expression Expression: Verbal Expression assist level: Expresses basic 75 - 89% of the time/requires cueing 10 - 24% of the time. Needs helper to occlude trach/needs to repeat words.  Function - Social Interaction Social Interaction assist level: Interacts appropriately  with others with medication or extra time (anti-anxiety, antidepressant).  Function - Problem Solving Problem solving assist level: Solves basic 75 - 89% of the time/requires cueing 10 - 24% of the time  Function - Memory Memory assist level: Recognizes or recalls 75 - 89% of the time/requires cueing 10 - 24% of the time Patient normally able to recall (first 3 days only): That he or she is in a hospital, Current season, Location of own room    Medical Problem List and Plan: 1. Functional deficits secondary to right MCA infarct with history of prior CVA 2008 as well as 2014, left hemiparesis, now with some finger to thumb opposition LUE      2.  DVT Prophylaxis/Anticoagulation: SCDs. Venous Doppler is negative 3. Pain Management: Tylenol as needed, hemiplegic shoulder pain- sportscreme and Kpad, post CVA headache no neuro exam changes, has some trap pain as well which may contribute to HA 4. Dysphagia. Dysphagia #2 thin liquid diet. Monitor for any signs of aspiration. Follow-up speech therapy 5. Neuropsych: This patient is capable of making decisions on her own behalf. 6. Skin/Wound Care: Routine skin checks 7. Fluids/Electrolytes/Nutrition: Routine I and Os with follow-up chemistries, ate 60-80%meals yest 8. Hyperlipidemia. Continue Pravachol 9. Hypertension. Norvasc 2.5 mg daily, Cozaar 100 mg daily, HCTZ 12.5 mg daily. Monitor with increased mobility, controlled ,No  orthostatic drop 10. History of asthma. Patient on albuterol inhaler as needed prior to admission 11.  Anemia- Fe studies ok, 2 neg and one pos stool guaic but this was during menstruation, monitor, recheck CBC in am 12.  Constipation- LOS (Days) 10 A FACE TO FACE EVALUATION WAS PERFORMED  KIRSTEINS,ANDREW E 06/24/2015, 7:19 AM

## 2015-06-24 NOTE — Progress Notes (Signed)
Patient alert and oriented this AM. Nurse Tech assisted patient to bathroom via Stedy.  Patient assisted back to bedside, and attempted to stand. Patient stated she became hot, BP 80/30 HR 81. Assisted patient back to bed  And rechecked BP 118/79 P 81. adm

## 2015-06-25 ENCOUNTER — Inpatient Hospital Stay (HOSPITAL_COMMUNITY): Payer: Self-pay

## 2015-06-25 ENCOUNTER — Inpatient Hospital Stay (HOSPITAL_COMMUNITY): Payer: Medicaid Other

## 2015-06-25 ENCOUNTER — Inpatient Hospital Stay (HOSPITAL_COMMUNITY): Payer: Medicaid Other | Admitting: Speech Pathology

## 2015-06-25 ENCOUNTER — Inpatient Hospital Stay (HOSPITAL_COMMUNITY): Payer: Medicaid Other | Admitting: Occupational Therapy

## 2015-06-25 DIAGNOSIS — I69391 Dysphagia following cerebral infarction: Secondary | ICD-10-CM

## 2015-06-25 NOTE — Progress Notes (Signed)
Occupational Therapy Session Note  Patient Details  Name: CRYSTIANNA MACTAGGART MRN: [id number] Date of Birth: 1965/12/31  Today's Date: 06/25/2015 OT Individual Time: [id number] OT Individual Time Calculation (min): 32 min    Skilled Therapeutic Interventions/Progress Updates:    Pt worked on simulated self feeding using AE.  Educated pt on use of a rocker knife for cutting up her food using the RUE.  Worked with use of knife and fork with built up handle in the hemiparetic left hand, but pt needs max assist to use efficiently and safely.  Feel at this time her best option is use of a plate guard and rocker knife.  Issued rocker knife for use on rehab and discussed use of pizza cutter, which can be purchased at [employer].  Did not issue plate guard this session, will continue to monitor in treatment and issue if needed.  Pt left in wheelchair at end of session with call button in place and nursing present to assist with lunch.  Educated nurse on pt's availability of AE for self feeding and need for intermittent supervision.    Therapy Documentation Precautions:  Precautions Precautions: Fall Precaution Comments: left hemiparesis, decr L awareness, impulsive Restrictions Weight Bearing Restrictions: No  Pain: Pain Assessment Pain Assessment: No/denies pain ADL: ADL ADL Comments: refer to functional navigator  See Function Navigator for Current Functional Status.   Therapy/Group: Individual Therapy  Aylla Huffine OTR/L 06/25/2015, 4:38 PM

## 2015-06-25 NOTE — Progress Notes (Signed)
Patient ID: Rebecca Yoder, female   DOB: 12/10/1965, 49 y.o.   MRN: [id number]   Subjective/Complaints: Mild HA, Left shoulder pain relieved by Kpad Asking about clot in brain ROS:  - CP, -SOB, - Joint pains  Objective: Vital Signs: Blood pressure 115/72, pulse 85, temperature 98 F (36.7 C), temperature source Oral, resp. rate 18, height 5' 4.5" (1.638 m), weight 83.1 kg (183 lb 3.2 oz), last menstrual period 06/12/2015, SpO2 98 %. No results found. Results for orders placed or performed during the hospital encounter of 06/14/15 (from the past 72 hour(s))  Occult blood card to lab, stool RN will collect     Status: Abnormal   Collection Time: 06/23/15  8:06 AM  Result Value Ref Range   Fecal Occult Bld POSITIVE (A) NEGATIVE  CBC     Status: Abnormal   Collection Time: 06/24/15 10:45 AM  Result Value Ref Range   WBC 6.3 4.0 - 10.5 K/uL   RBC 4.10 3.87 - 5.11 MIL/uL   Hemoglobin 12.0 12.0 - 15.0 g/dL   HCT 36.9 36.0 - 46.0 %   MCV 90.0 78.0 - 100.0 fL   MCH 29.3 26.0 - 34.0 pg   MCHC 32.5 30.0 - 36.0 g/dL   RDW 13.9 11.5 - 15.5 %   Platelets 462 (H) 150 - 400 K/uL     HEENT: normal Cardio: RRR Resp: CTA B/L and unlabored GI: BS positive and NT, ND Extremity:  No Edema Skin:   Intact Neuro: Alert/Oriented, Abnormal Sensory reduced in Left foot, Abnormal Motor 3- Left delt bi tri grip, 3- L HF, 2- L KE, ankle, Abnormal FMC Ataxic/ dec FMC and Tone  Hypertonia, Tone:  Hypertonia and Inattention Musc/Skel:  Other no pain with UE or LE ROM, Tenderness over R trap   Assessment/Plan: 1. Functional deficits secondary to RIght MCA infarct which require 3+ hours per day of interdisciplinary therapy in a comprehensive inpatient rehab setting. Physiatrist is providing close team supervision and 24 hour management of active medical problems listed below. Physiatrist and rehab team continue to assess barriers to discharge/monitor patient progress toward functional and medical  goals.  FIM: Function - Bathing Position: Shower Body parts bathed by patient: Right arm, Left arm, Chest, Abdomen, Front perineal area, Right upper leg, Left upper leg, Right lower leg, Left lower leg Body parts bathed by helper: Buttocks Assist Level: Touching or steadying assistance(Pt > 75%)  Function- Upper Body Dressing/Undressing What is the patient wearing?: Pull over shirt/dress, Bra Bra - Perfomed by patient: Thread/unthread right bra strap, Thread/unthread left bra strap Bra - Perfomed by helper: Hook/unhook bra (pull down sports bra) Pull over shirt/dress - Perfomed by patient: Thread/unthread right sleeve, Put head through opening, Thread/unthread left sleeve, Pull shirt over trunk Pull over shirt/dress - Perfomed by helper: Thread/unthread left sleeve, Pull shirt over trunk Assist Level: Touching or steadying assistance(Pt > 75%) Function - Lower Body Dressing/Undressing What is the patient wearing?: Pants, Non-skid slipper socks, Shoes, Underwear Position: Wheelchair/chair at sink Underwear - Performed by patient: Thread/unthread right underwear leg, Thread/unthread left underwear leg Underwear - Performed by helper: Pull underwear up/down Pants- Performed by patient: Thread/unthread right pants leg, Thread/unthread left pants leg Pants- Performed by helper: Pull pants up/down Non-skid slipper socks- Performed by patient: Don/doff right sock Non-skid slipper socks- Performed by helper: Don/doff left sock Socks - Performed by patient: Don/doff right sock, Don/doff left sock Socks - Performed by helper:  (sitting balance) Shoes - Performed by patient: Don/doff right shoe  Shoes - Performed by helper: Don/doff right shoe, Fasten right AFO - Performed by helper: Don/doff left AFO Assist for footwear: Partial/moderate assist Assist for lower body dressing: Touching or steadying assistance (Pt > 75%)  Function - Toileting Toileting steps completed by patient: Performs  perineal hygiene Toileting steps completed by helper: Adjust clothing prior to toileting, Performs perineal hygiene Toileting Assistive Devices: Grab bar or rail Assist level: Touching or steadying assistance (Pt.75%)  Function - Air cabin crew transfer assistive device: Elevated toilet seat/BSC over toilet, Grab bar, Walker Mechanical lift: Stedy Assist level to toilet: Moderate assist (Pt 50 - 74%/lift or lower) Assist level from toilet: Moderate assist (Pt 50 - 74%/lift or lower) Assist level to bedside commode (at bedside): Maximal assist (Pt 25 - 49%/lift and lower) Assist level from bedside commode (at bedside): Maximal assist (Pt 25 - 49%/lift and lower)  Function - Chair/bed transfer Chair/bed transfer method: Stand pivot Chair/bed transfer assist level: Moderate assist (Pt 50 - 74%/lift or lower) Chair/bed transfer assistive device: Other, Armrests [facility]) Chair/bed transfer details: Verbal cues for safe use of DME/AE, Verbal cues for technique, Manual facilitation for placement  Function - Locomotion: Wheelchair Will patient use wheelchair at discharge?: Yes Type: Manual Max wheelchair distance: 150 Assist Level: Supervision or verbal cues Assist Level: Supervision or verbal cues Wheel 150 feet activity did not occur: Safety/medical concerns Assist Level: Supervision or verbal cues Turns around,maneuvers to table,bed, and toilet,negotiates 3% grade,maneuvers on rugs and over doorsills: No Function - Locomotion: Ambulation Assistive device: Cane-quad, Orthosis Max distance: 72 Assist level: Moderate assist (Pt 50 - 74%) Assist level: Moderate assist (Pt 50 - 74%) Walk 50 feet with 2 turns activity did not occur: Safety/medical concerns Assist level: Moderate assist (Pt 50 - 74%) Walk 150 feet activity did not occur: Safety/medical concerns Walk 10 feet on uneven surfaces activity did not occur: Safety/medical concerns  Function - Comprehension Comprehension:  Auditory Comprehension assist level: Understands basic 90% of the time/cues < 10% of the time  Function - Expression Expression: Verbal Expression assist level: Expresses basic 75 - 89% of the time/requires cueing 10 - 24% of the time. Needs helper to occlude trach/needs to repeat words.  Function - Social Interaction Social Interaction assist level: Interacts appropriately with others with medication or extra time (anti-anxiety, antidepressant).  Function - Problem Solving Problem solving assist level: Solves basic 75 - 89% of the time/requires cueing 10 - 24% of the time  Function - Memory Memory assist level: Recognizes or recalls 75 - 89% of the time/requires cueing 10 - 24% of the time Patient normally able to recall (first 3 days only): That he or she is in a hospital, Current season, Location of own room, Staff names and faces    Medical Problem List and Plan: 1. Functional deficits secondary to right MCA infarct with history of prior CVA 2008 as well as 2014, left hemiparesis, now with some finger to thumb opposition LUE, Plavix for CVA prophyllaxis      2.  DVT Prophylaxis/Anticoagulation: SCDs. Venous Doppler is negative 3. Pain Management: Tylenol as needed, hemiplegic shoulder pain- sportscreme and Kpad, post CVA headache improved with treatment of myofascial neck pain 4. Dysphagia. Dysphagia #2 thin liquid diet. Monitor for any signs of aspiration. Follow-up speech therapy 5. Neuropsych: This patient is capable of making decisions on her own behalf. 6. Skin/Wound Care: Routine skin checks 7. Fluids/Electrolytes/Nutrition: Routine I and Os with follow-up chemistries, ate 100%meals yest 8. Hyperlipidemia. Continue Pravachol 9. Hypertension. Norvasc  2.5 mg daily, Cozaar 100 mg daily, HCTZ 12.5 mg daily. Monitor with increased mobility, controlled ,No  orthostatic drop 10. History of asthma. Patient on albuterol inhaler as needed prior to admission 11.  Anemia- Fe studies ok, 1  neg and 2 pos stool guaic one pos during menstruation, monitor, recheck CBC shows normal Hgb, can f/u GI as outpt 12.  Constipation-improvedLOS (Days) 11 A FACE TO FACE EVALUATION WAS PERFORMED  Roxine Whittinghill E 06/25/2015, 6:51 AM

## 2015-06-25 NOTE — Progress Notes (Signed)
Physical Therapy Session Note  Patient Details  Name: Rebecca Yoder MRN: [id number] Date of Birth: 15-Feb-1966  Today's Date: 06/25/2015 PT Individual Time:1005  - 1105, 60 min individual    Short Term Goals: Week 2:  PT Short Term Goal 1 (Week 2): Patient to ambulate with QC 75' min/mod A. PT Short Term Goal 2 (Week 2): Patient to transfer consistently with min A sit to stand and bed<>w/c. PT Short Term Goal 3 (Week 2): Patient to be independent with w/c set up prior to transfers with min questioning cues. PT Short Term Goal 4 (Week 2): Patient to negotiate 4 steps with rail mod A PT Short Term Goal 5 (Week 2): Patient to perform standing dynamic activities with min A at least 50% of the time.     Skilled Therapeutic Interventions/Progress Updates:  RN assisting pt to BR using Stedy when PT entered.  Toilet transfer with mod assist for balance; mod assist for clothing.  Pt c/o feeling "hot and dizzy, and weak "; vitals below; RN informed. Pt improved with rest and ginger ale.  W/c propulsion to gym using hemi technique..  neuromuscular re-education via demo, VCs, manual cues for pelvic dissociation for scooting forward/backward in w/c, and LLE hip flex, knee ext and flex movements out of synergy, in sitting.  Gait with L AFO and NBQC x 40' with mod/max assist for balance, wt shifting, upright trunk; cues for step lengths, BOS.  Pt presents with impulsive gait, limited wt bearing LLE and short RLe step length.    Pt returned to room via w/c propulsion, and left resting in w/c with all needs within reach.    Therapy Documentation Precautions:  Precautions Precautions: Fall Precaution Comments: left hemiparesis, decr L awareness, impulsive Restrictions Weight Bearing Restrictions: No   Vital Signs: Therapy Vitals Pulse Rate: 92 BP: 137/88 mmHg Patient Position (if appropriate): Sitting Pain: Pain Assessment Pain Assessment: No/denies pain      See Function Navigator for  Current Functional Status.   Therapy/Group: Individual Therapy  Ranyah Groeneveld 06/25/2015, 5:06 PM

## 2015-06-25 NOTE — Progress Notes (Signed)
Speech Language Pathology Daily Session Note  Patient Details  Name: Rebecca Yoder MRN: [id number] Date of Birth: [birth year]  Today's Date: 06/25/2015 SLP Individual Time: [id number] SLP Individual Time Calculation (min): 59 min  Short Term Goals: Week 2: SLP Short Term Goal 1 (Week 2): Pt will utilize increased vocal intensity, pacing, and overarticulation to achieve intelligibility in sentences with supervision cues.  SLP Short Term Goal 2 (Week 2): Pt will return demonstration of at least 2 safety precautions during functional tasks over 3 targeted sessions with supervision.  SLP Short Term Goal 3 (Week 2): Pt will consume dys 3 textures and thin liquids with mod I to monitor and correct left sided buccal residue over 2 targeted sessions prior to diet advancement.   SLP Short Term Goal 4 (Week 2): Pt will consume regular textures with supervision cues to monitor and correct left sided buccal residue over 2 targeted session prior to diet advancement..    Skilled Therapeutic Interventions: Skilled treatment session focused on addressing dysphagia goals. SLP facilitated session by providing set-up of trials of regular textures and thin liquids via straw, which patient consumed with increased time to manage left sided buccal residue.  No overt s/s of aspiration were observed; as a result, recommend diet advancement to regular textures with thin liquids and intermittent supervision after set-up assist.  Skilled treatment session also targeted dysarthria goals.  Patient verbally instructed SLP how to make a cake with adequate vocal intensity; however, patient required Min assist verbal cues to slow pace and allow time for over articulation of verbal output at the sentence-conversational level.     Function:  Eating Eating   Modified Consistency Diet: No Eating Assist Level: Set up assist for   Eating Set Up Assist For: Opening containers;Cutting food       Cognition Comprehension  Comprehension assist level: Understands basic 90% of the time/cues < 10% of the time  Expression   Expression assist level: Expresses basic 75 - 89% of the time/requires cueing 10 - 24% of the time. Needs helper to occlude trach/needs to repeat words.  Social Interaction Social Interaction assist level: Interacts appropriately with others with medication or extra time (anti-anxiety, antidepressant).  Problem Solving Problem solving assist level: Solves basic 75 - 89% of the time/requires cueing 10 - 24% of the time  Memory Memory assist level: Recognizes or recalls 75 - 89% of the time/requires cueing 10 - 24% of the time    Pain Pain Assessment Pain Assessment: No/denies pain  Therapy/Group: Individual Therapy  Carmelia Roller., CCC-SLP [id number]  [place] 06/25/2015, 2:38 PM

## 2015-06-25 NOTE — Progress Notes (Signed)
Occupational Therapy Session Note  Patient Details  Name: Rebecca Yoder MRN: [id number] Date of Birth: 01/21/1966  Today's Date: 06/25/2015 OT Individual Time: 0900-1000 OT Individual Time Calculation (min): 60 min    Short Term Goals: Week 2:  OT Short Term Goal 1 (Week 2): Pt will don shirt with min A    OT Short Term Goal 2 (Week 2): Pt will don pants with min A to steady her balance.   OT Short Term Goal 3 (Week 2): Pt will don bra with Mod A OT Short Term Goal 4 (Week 2): Pt will complete toileting with min assist to manage clothing OT Short Term Goal 5 (Week 2): Pt will incorporate LUE as gross assist during selected dressing task  Skilled Therapeutic Interventions/Progress Updates:    Pt resting in bed upon arrival and agreeable to therapy.  Pt engaged in BADL retraining including bathing at shower level and dressing with sit<>stand from w/c at sink.  Pt required mod verbal cues for sequencing and technique during transfers but is able to complete transfers with min A.  Pt used AE (long handle sponge and bath mit) to assist with bathing tasks.  Pt incorporated compensatory strategies/techniques during bathing and dressing tasks. Pt was able to perform sit<>stand in shower and at sink with steady A and required steady A for standing balance to facilitate assistance with bathing buttocks and pulling up pants.  Pt remained in w/c with QRB in place and all needs within reach.  Focus on activity tolerance, sit<>stand, standing balance, LUE use, attention to left, safety awareness, task initiation, and sequencing.  Therapy Documentation Precautions:  Precautions Precautions: Fall Precaution Comments: left hemiparesis, decr L awareness, impulsive Restrictions Weight Bearing Restrictions: No   Pain:  Pt denied pain ADL: ADL ADL Comments: refer to functional navigator  See Function Navigator for Current Functional Status.   Therapy/Group: Individual Therapy  Leroy Libman 06/25/2015, 10:05 AM

## 2015-06-26 ENCOUNTER — Inpatient Hospital Stay (HOSPITAL_COMMUNITY): Payer: Medicaid Other | Admitting: *Deleted

## 2015-06-26 ENCOUNTER — Inpatient Hospital Stay (HOSPITAL_COMMUNITY): Payer: Medicaid Other | Admitting: Physical Therapy

## 2015-06-26 ENCOUNTER — Inpatient Hospital Stay (HOSPITAL_COMMUNITY): Payer: Self-pay

## 2015-06-26 ENCOUNTER — Inpatient Hospital Stay (HOSPITAL_COMMUNITY): Payer: Medicaid Other | Admitting: Occupational Therapy

## 2015-06-26 ENCOUNTER — Inpatient Hospital Stay (HOSPITAL_COMMUNITY): Payer: Medicaid Other | Admitting: Speech Pathology

## 2015-06-26 NOTE — Progress Notes (Signed)
Physical Therapy Session Note  Patient Details  Name: Rebecca Yoder MRN: [id number] Date of Birth: September 08, 1965  Today's Date: 06/26/2015 PT Individual Time: 4081-4481 PT Individual Time Calculation (min): 26 min   Short Term Goals: Week 2:  PT Short Term Goal 1 (Week 2): Patient to ambulate with QC 75' min/mod A. PT Short Term Goal 2 (Week 2): Patient to transfer consistently with min A sit to stand and bed<>w/c. PT Short Term Goal 3 (Week 2): Patient to be independent with w/c set up prior to transfers with min questioning cues. PT Short Term Goal 4 (Week 2): Patient to negotiate 4 steps with rail mod A PT Short Term Goal 5 (Week 2): Patient to perform standing dynamic activities with min A at least 50% of the time.  Skilled Therapeutic Interventions/Progress Updates:    Pt received sitting in w/c, agreeable to therapy session.  Session focus on NMR and activity tolerance. Pt propelled w/c to/from therapy gym using hemi technique and able to set up w/c appropriately for transfer, using R hand over L hand to lock L w/c brake.  Steady assist stand pivot transfer from w/c<>kinetron. Pt performed 5 min of kinetron in seated position at 40 cm/s and 2x2 min of kinetron in standing position at 40 cm/s.  Pt returned to room in w/c and positioned with QRB in place and needs met.   Therapy Documentation Precautions:  Precautions Precautions: Fall Precaution Comments: left hemiparesis, decr L awareness, impulsive Restrictions Weight Bearing Restrictions: No General:   Vital Signs: Therapy Vitals Temp: 98.1 F (36.7 C) Temp Source: Oral Pulse Rate: 91 Resp: 16 BP: 121/81 mmHg Patient Position (if appropriate): Sitting Oxygen Therapy SpO2: 97 % O2 Device: Not Delivered Pain: Pain Assessment Pain Assessment: No/denies pain Pain Score: 6  Faces Pain Scale: Hurts little more Pain Type: Acute pain Pain Location: Head Pain Descriptors / Indicators: Headache Pain Frequency:  Intermittent Pain Onset: On-going Patients Stated Pain Goal: 2 Pain Intervention(s): RN made aware Multiple Pain Sites: No Mobility:   Locomotion :    Trunk/Postural Assessment :    Balance:   Exercises:   Other Treatments:     See Function Navigator for Current Functional Status.   Therapy/Group: Individual Therapy  Earnest Conroy Penven-Crew 06/26/2015, 4:38 PM

## 2015-06-26 NOTE — Progress Notes (Signed)
Occupational Therapy Session Note  Patient Details  Name: Rebecca Yoder MRN: [id number] Date of Birth: 1966-07-18  Today's Date: 06/26/2015 OT Individual Time: 1302-1400 OT Individual Time Calculation (min): 58 min    Short Term Goals: Week 2:  OT Short Term Goal 1 (Week 2): Pt will don shirt with min A    OT Short Term Goal 2 (Week 2): Pt will don pants with min A to steady her balance.   OT Short Term Goal 3 (Week 2): Pt will don bra with Mod A OT Short Term Goal 4 (Week 2): Pt will complete toileting with min assist to manage clothing OT Short Term Goal 5 (Week 2): Pt will incorporate LUE as gross assist during selected dressing task  Skilled Therapeutic Interventions/Progress Updates:    Pt seen this session to facilitate functional mobility, dynamic balance, active use of LUE, and adaptive ADL techniques. Pt anxious to shower. Continues to need guiding assist to fully lean forward to wt shift during squat pivot transfers bed to chair, chair>< tub seat, chair >< toilet.  She has improved with sit to stand and maintaining wt through LLE to be able to pull pants over hips 80% of the way with min A to support balance and support 50% through L knee. Once self care completed, pt worked on standing to side of bed and using R hand on bed rail for support as she worked on A/AROM on LUE using PNF patterns.  Pt needing A to guide and control movement but does have good elbow activity. During functional tasks, pt's L hand occasionally presents as "alien arm" in that she has spontaneous  uncontrolled movement of hand that she is not aware of.  At end of session, pt needed to toilet. She transferred to toilet with min A, stood with min A as she pulled down her pants. Nurse tech called in to assist pt as session time was over.  Therapy Documentation Precautions:  Precautions Precautions: Fall Precaution Comments: left hemiparesis, decr L awareness, impulsive Restrictions Weight Bearing  Restrictions: No   Pain: Pain Assessment Pain Score: 2  ADL: ADL ADL Comments: refer to functional navigator  See Function Navigator for Current Functional Status.   Therapy/Group: Individual Therapy  [name] 06/26/2015, 12:45 PM

## 2015-06-26 NOTE — Progress Notes (Signed)
Speech Language Pathology Daily Session Note  Patient Details  Name: Rebecca Yoder MRN: [id number] Date of Birth: 1965-09-10  Today's Date: 06/26/2015 SLP Individual Time: 1410-1505 SLP Individual Time Calculation (min): 55 min  Short Term Goals: Week 2: SLP Short Term Goal 1 (Week 2): Pt will utilize increased vocal intensity, pacing, and overarticulation to achieve intelligibility in sentences with supervision cues.  SLP Short Term Goal 2 (Week 2): Pt will return demonstration of at least 2 safety precautions during functional tasks over 3 targeted sessions with supervision.  SLP Short Term Goal 3 (Week 2): Pt will consume dys 3 textures and thin liquids with mod I to monitor and correct left sided buccal residue over 2 targeted sessions prior to diet advancement.   SLP Short Term Goal 4 (Week 2): Pt will consume regular textures with supervision cues to monitor and correct left sided buccal residue over 2 targeted session prior to diet advancement..    Skilled Therapeutic Interventions:  Pt was seen for skilled ST targeting communication goals.  SLP facilitated the session with a categorical naming task targeting use of dysarthria strategies at the sentence level.  Pt generated the names of specific category members and then used generated words in sentences with min assist verbal cues for use of overarticulation and pacing.  At the end of therapy session, pt was left upright in wheelchair with quick release belt donned, call bell left within reach.  Continue per current plan of care.    Function:  Eating Eating                 Cognition Comprehension Comprehension assist level: Understands complex 90% of the time/cues 10% of the time  Expression   Expression assist level: Expresses basic 75 - 89% of the time/requires cueing 10 - 24% of the time. Needs helper to occlude trach/needs to repeat words.  Social Interaction Social Interaction assist level: Interacts appropriately  with others with medication or extra time (anti-anxiety, antidepressant).  Problem Solving Problem solving assist level: Solves basic problems with no assist  Memory Memory assist level: Recognizes or recalls 75 - 89% of the time/requires cueing 10 - 24% of the time    Pain Pain Assessment Pain Assessment: No/denies pain   Therapy/Group: Individual Therapy  Rebecca Yoder, Rebecca Yoder 06/26/2015, 4:39 PM

## 2015-06-26 NOTE — Progress Notes (Signed)
Patient ID: Rebecca Yoder, female   DOB: Nov 11, 1965, 49 y.o.   MRN: [id number]   Subjective/Complaints: Headache at crown of head last noc, Left arm felt a little heavier last noc Awakened form sleep ROS:  - CP, -SOB, - Joint pains  Objective: Vital Signs: Blood pressure 120/73, pulse 89, temperature 97.8 F (36.6 C), temperature source Oral, resp. rate 16, height 5' 4.5" (1.638 m), weight 83.1 kg (183 lb 3.2 oz), last menstrual period 06/12/2015, SpO2 97 %. No results found. Results for orders placed or performed during the hospital encounter of 06/14/15 (from the past 72 hour(s))  Occult blood card to lab, stool RN will collect     Status: Abnormal   Collection Time: 06/23/15  8:06 AM  Result Value Ref Range   Fecal Occult Bld POSITIVE (A) NEGATIVE  CBC     Status: Abnormal   Collection Time: 06/24/15 10:45 AM  Result Value Ref Range   WBC 6.3 4.0 - 10.5 K/uL   RBC 4.10 3.87 - 5.11 MIL/uL   Hemoglobin 12.0 12.0 - 15.0 g/dL   HCT 36.9 36.0 - 46.0 %   MCV 90.0 78.0 - 100.0 fL   MCH 29.3 26.0 - 34.0 pg   MCHC 32.5 30.0 - 36.0 g/dL   RDW 13.9 11.5 - 15.5 %   Platelets 462 (H) 150 - 400 K/uL     HEENT: normal Cardio: RRR Resp: CTA B/L and unlabored GI: BS positive and NT, ND Extremity:  No Edema Skin:   Intact Neuro: Alert/Oriented, + dysarthria Abnormal Sensory reduced in Left foot, Abnormal Motor 3- Left delt bi tri grip, 3- L HF, 2- L KE, ankle, Abnormal FMC Ataxic/ dec FMC and Tone  Hypertonia, Tone:  Hypertonia and Inattention Musc/Skel:  Other no pain with UE or LE ROM, Tenderness over R trap   Assessment/Plan: 1. Functional deficits secondary to RIght MCA infarct which require 3+ hours per day of interdisciplinary therapy in a comprehensive inpatient rehab setting. Physiatrist is providing close team supervision and 24 hour management of active medical problems listed below. Physiatrist and rehab team continue to assess barriers to discharge/monitor patient progress  toward functional and medical goals.  FIM: Function - Bathing Position: Shower Body parts bathed by patient: Right arm, Left arm, Chest, Abdomen, Front perineal area, Right upper leg, Left upper leg, Right lower leg, Left lower leg, Back Body parts bathed by helper: Buttocks Assist Level: Touching or steadying assistance(Pt > 75%)  Function- Upper Body Dressing/Undressing What is the patient wearing?: Bra, Pull over shirt/dress Bra - Perfomed by patient: Thread/unthread right bra strap, Thread/unthread left bra strap Bra - Perfomed by helper: Hook/unhook bra (pull down sports bra) Pull over shirt/dress - Perfomed by patient: Thread/unthread right sleeve, Put head through opening, Thread/unthread left sleeve, Pull shirt over trunk Pull over shirt/dress - Perfomed by helper: Thread/unthread left sleeve, Pull shirt over trunk Assist Level: Touching or steadying assistance(Pt > 75%) Function - Lower Body Dressing/Undressing What is the patient wearing?: Pants, Socks, Shoes, AFO Position: Wheelchair/chair at sink Underwear - Performed by patient: Thread/unthread right underwear leg, Thread/unthread left underwear leg Underwear - Performed by helper: Pull underwear up/down Pants- Performed by patient: Thread/unthread right pants leg, Thread/unthread left pants leg Pants- Performed by helper: Pull pants up/down Non-skid slipper socks- Performed by patient: Don/doff right sock Non-skid slipper socks- Performed by helper: Don/doff left sock Socks - Performed by patient: Don/doff left sock Socks - Performed by helper: Don/doff right sock Shoes - Performed by patient:  Don/doff right shoe Shoes - Performed by helper: Don/doff left shoe AFO - Performed by helper: Don/doff left AFO Assist for footwear: Partial/moderate assist Assist for lower body dressing: Touching or steadying assistance (Pt > 75%)  Function - Toileting Toileting steps completed by patient: Performs perineal hygiene Toileting  steps completed by helper: Adjust clothing prior to toileting, Adjust clothing after toileting Toileting Assistive Devices: Grab bar or rail Assist level: Touching or steadying assistance (Pt.75%)  Function - Air cabin crew transfer assistive device: Elevated toilet seat/BSC over toilet, Grab bar, Walker Mechanical lift: Stedy Assist level to toilet: Moderate assist (Pt 50 - 74%/lift or lower) Assist level from toilet: Moderate assist (Pt 50 - 74%/lift or lower) Assist level to bedside commode (at bedside): Maximal assist (Pt 25 - 49%/lift and lower) Assist level from bedside commode (at bedside): Maximal assist (Pt 25 - 49%/lift and lower)  Function - Chair/bed transfer Chair/bed transfer method: Stand pivot Chair/bed transfer assist level: Moderate assist (Pt 50 - 74%/lift or lower) Chair/bed transfer assistive device: Other, Armrests [facility]) Chair/bed transfer details: Verbal cues for safe use of DME/AE, Verbal cues for technique, Manual facilitation for placement  Function - Locomotion: Wheelchair Will patient use wheelchair at discharge?: Yes Type: Manual Max wheelchair distance: 150 Assist Level: Supervision or verbal cues Assist Level: Supervision or verbal cues Wheel 150 feet activity did not occur: Safety/medical concerns Assist Level: Supervision or verbal cues Turns around,maneuvers to table,bed, and toilet,negotiates 3% grade,maneuvers on rugs and over doorsills: No Function - Locomotion: Ambulation Assistive device: Cane-quad, Orthosis Max distance: 40 Assist level: Maximal assist (Pt 25 - 49%) Assist level: Maximal assist (Pt 25 - 49%) Walk 50 feet with 2 turns activity did not occur: Safety/medical concerns Assist level: Moderate assist (Pt 50 - 74%) Walk 150 feet activity did not occur: Safety/medical concerns Walk 10 feet on uneven surfaces activity did not occur: Safety/medical concerns  Function - Comprehension Comprehension: Auditory Comprehension  assist level: Understands basic 90% of the time/cues < 10% of the time  Function - Expression Expression: Verbal Expression assist level: Expresses basic 75 - 89% of the time/requires cueing 10 - 24% of the time. Needs helper to occlude trach/needs to repeat words.  Function - Social Interaction Social Interaction assist level: Interacts appropriately with others with medication or extra time (anti-anxiety, antidepressant).  Function - Problem Solving Problem solving assist level: Solves basic 75 - 89% of the time/requires cueing 10 - 24% of the time  Function - Memory Memory assist level: Recognizes or recalls 75 - 89% of the time/requires cueing 10 - 24% of the time Patient normally able to recall (first 3 days only): That he or she is in a hospital, Current season, Location of own room, Staff names and faces    Medical Problem List and Plan: 1. Functional deficits secondary to right MCA infarct with history of prior CVA 2008 as well as 2014, left hemiparesis, no change in motor exam this am, Plavix for CVA prophyllaxis      2.  DVT Prophylaxis/Anticoagulation: SCDs. Venous Doppler is negative 3. Pain Management: Tylenol as needed, hemiplegic shoulder pain- sportscreme and Kpad, post CVA headache, may need to start topamax 4. Dysphagia. Improved on regular diet. Monitor for any signs of aspiration. Follow-up speech therapy 5. Neuropsych: This patient is capable of making decisions on her own behalf. 6. Skin/Wound Care: Routine skin checks 7. Fluids/Electrolytes/Nutrition: Routine I and Os with follow-up chemistries, back on regular diet with thin liquids, appetite improved 8. Hyperlipidemia. Continue  Pravachol 9. Hypertension. Norvasc 2.5 mg daily, Cozaar 100 mg daily, HCTZ 12.5 mg daily. Monitor with increased mobility, controlled ,No  orthostatic drop, 120/73 10. History of asthma. Patient on albuterol inhaler as needed prior to admission 11.  Anemia- Fe studies ok, 1 neg and 2 pos  stool guaic one pos during menstruation, monitor, recheck CBC shows normal Hgb, can f/u GI as outpt LOS (Days) 12 A FACE TO FACE EVALUATION WAS PERFORMED  KIRSTEINS,ANDREW E 06/26/2015, 7:05 AM

## 2015-06-26 NOTE — Progress Notes (Addendum)
Physical Therapy Session Note  Patient Details  Name: Rebecca Yoder MRN: [id number] Date of Birth: 11-May-1966  Today's Date: 06/26/2015 PT Individual Time: 0830-0930 PT Individual Time Calculation (min): 60 min   Short Term Goals: Week 2:  PT Short Term Goal 1 (Week 2): Patient to ambulate with QC 75' min/mod A. PT Short Term Goal 2 (Week 2): Patient to transfer consistently with min A sit to stand and bed<>w/c. PT Short Term Goal 3 (Week 2): Patient to be independent with w/c set up prior to transfers with min questioning cues. PT Short Term Goal 4 (Week 2): Patient to negotiate 4 steps with rail mod A PT Short Term Goal 5 (Week 2): Patient to perform standing dynamic activities with min A at least 50% of the time.  Skilled Therapeutic Interventions/Progress Updates:    Patient seen with focus of treatment on left trunk, LE NMR.  Up to edge of bed supervision and gait to bathroom with QC mod A, cues for foot placement, balance.  Assist with clothing management.  Patient in w/c to propel to gym supervision (also for parts management.)  W/c to mat min A increased time cues for set up and safety with placement of L LE, back to chair sat on wheel and uncontrolled descent.  Education on balance with transfers to avoid "falling" into chair.  Patient able to perform with minguard A, increased time and improved safety next trial.  Patient seated on mat to attempt reciprocal scooting with mod cues and facilitation.  Remediation with R sidelying pelvic protraction/retraction; elevation/depression and diagonals, then reciprocal scooting with improved technique and L trunk activation.  Gait x 40' min/mod A with L AFO and WBQC min cues for step width L LE and facilitation for L pelvic protraction.  Propelled to room supervision and left up in chair with quick release belt and needs in reach.  Therapy Documentation Precautions:  Precautions Precautions: Fall Precaution Comments: left hemiparesis, decr L  awareness, impulsive Restrictions Weight Bearing Restrictions: No Pain: Pain Assessment Pain Assessment: 0-10 Pain Score: 6  Faces Pain Scale: Hurts little more Pain Type: Acute pain Pain Location: Head Pain Descriptors / Indicators: Headache Pain Frequency: Intermittent Pain Onset: On-going Patients Stated Pain Goal: 2 Pain Intervention(s): RN made aware Multiple Pain Sites: No   See Function Navigator for Current Functional Status.   Therapy/Group: Individual Therapy  [place], [place] 06/27/2015  06/26/2015, 3:59 PM

## 2015-06-26 NOTE — Progress Notes (Signed)
Recreational Therapy Assessment and Plan  Patient Details  Name: Rebecca Yoder MRN: [id number] Date of Birth: 04-Jun-1966 Today's Date: 06/26/2015  Rehab Potential: Good ELOS: 10 days   Assessment Clinical Impression: Problem List:  Patient Active Problem List   Diagnosis Date Noted  . Right middle cerebral artery stroke ([place]) 06/14/2015  . Left hemiparesis ([place])   . Left-sided neglect   . Acute left hemiparesis ([place]) 06/13/2015  . Hemi-neglect of left side 06/13/2015  . Stroke (cerebrum) ([place]) 06/11/2015  . TIA (transient ischemic attack) 06/09/2013  . Hypokalemia 06/09/2013  . PFO (patent foramen ovale) 06/09/2013  . CVA (cerebral infarction) 06/09/2013  . Hyperlipidemia 12/10/2009  . Essential hypertension 12/10/2009  . Asthma 12/10/2009  . CEREBROVASCULAR ACCIDENT, HX OF 12/10/2009    Past Medical History:  Past Medical History  Diagnosis Date  . ASTHMA 12/10/2009  . CEREBROVASCULAR ACCIDENT, HX OF 12/10/2009  . HYPERLIPIDEMIA 12/10/2009  . HYPERTENSION 12/10/2009  . Complication of anesthesia     "just can't get me up" (06/09/2013)  . Stroke [facility]) 2008    denies residual on 06/09/2013  . Tendonitis of wrist, right    Past Surgical History:  Past Surgical History  Procedure Laterality Date  . Ovarian cyst removal  1990's    Assessment & Plan Clinical Impression: Rebecca Yoder is a 49 y.o. right handed female with history of hypertension, previous right brain subcortical infarct October 2014 as well as remote right frontal insular infarct 2008 and MCA stenosis with known PFO maintained on aspirin 325 mg daily. Patient lives with her sister and mother who has cancer and undergoing treatment at this time. Independent prior to admission and driving. One level house with steps to entry Presented 06/11/2015 with sudden onset of left-sided weakness, dysarthria and right gaze deviation.  Cranial CT scan negative for acute changes. Patient did receive TPA. CTA of head and neck shows occluded M1 segment of the right middle cerebral artery that appeared to have progressed since prior MR angiogram. Narrowing proximal right internal carotid artery with less than 50% diameter stenosis. MRI 06/13/2015 shows progressive nonhemorrhagic infarct involving the right basal ganglia. Remote anterior right MCA territory infarct involving the insular cortex.. Echocardiogram with ejection fraction of 70% no wall motion abnormalities. Venous Dopplers lower extremities negative for DVT. Neurology consulted with workup and maintained on Plavix therapy for CVA prophylaxis. Dysphagia #2 thin liquid diet. Physical therapy evaluation completed with recommendations of physical medicine rehabilitation consult. Patient transferred to CIR on 06/14/2015 .       Pt presents with decreased activity tolerance, decreased functional mobility, decreased balance, decreased coordination, decreased safety, decreased awareness, decreased memory Limiting pt's independence with leisure/community pursuits.   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, engineering (working PT washing dishes at the [employer] ) Expression Interests: Music (Comment) Other Leisure Interests: Television;Movies Leisure Participation Style: With Family/Friends Awareness of Community Resources: Good-identify 3 post discharge leisure resources Psychosocial / Spiritual Patient agreeable to Pet Therapy: No Does patient have pets?: No Social interaction - Mood/Behavior: Cooperative Engineer, drilling for Education?: Yes Recreational Therapy Orientation Orientation -Reviewed with patient: Available activity resources Strengths/Weaknesses Patient Strengths/Abilities: Willingness to participate;Active premorbidly Patient weaknesses: Physical  limitations TR Patient demonstrates impairments in the following area(s): Endurance;Motor;Safety  Plan Rec Therapy Plan Is patient appropriate for Therapeutic Recreation?: Yes Rehab Potential: Good Treatment times per week: Min 1 time per week >20 minutes Estimated Length of Stay: 10 days TR Treatment/Interventions: Adaptive equipment  instruction;1:1 session;Balance/vestibular training;Functional mobility training;Community reintegration;Cognitive remediation/compensation;Patient/family education;Therapeutic activities;Recreation/leisure participation;Therapeutic exercise;UE/LE Coordination activities  Recommendations for other services: Neuropsych  Discharge Criteria: Patient will be discharged from TR if patient refuses treatment 3 consecutive times without medical reason.  If treatment goals not met, if there is a change in medical status, if patient makes no progress towards goals or if patient is discharged from hospital.  The above assessment, treatment plan, treatment alternatives and goals were discussed and mutually agreed upon: by patient  [place] 06/26/2015, 4:11 PM

## 2015-06-27 ENCOUNTER — Inpatient Hospital Stay (HOSPITAL_COMMUNITY): Payer: Self-pay | Admitting: Speech Pathology

## 2015-06-27 ENCOUNTER — Inpatient Hospital Stay (HOSPITAL_COMMUNITY): Payer: Medicaid Other | Admitting: Speech Pathology

## 2015-06-27 ENCOUNTER — Inpatient Hospital Stay (HOSPITAL_COMMUNITY): Payer: Medicaid Other

## 2015-06-27 ENCOUNTER — Inpatient Hospital Stay (HOSPITAL_COMMUNITY): Payer: Medicaid Other | Admitting: Physical Therapy

## 2015-06-27 NOTE — Progress Notes (Signed)
Patient ID: Rebecca Yoder, female   DOB: [birth year], 49 y.o.   MRN: [id number]   Subjective/Complaints: Some shoulder pain last noc improved with repositioning ROS:  - CP, -SOB, - Joint pains  Objective: Vital Signs: Blood pressure 99/69, pulse 79, temperature 97.6 F (36.4 C), temperature source Oral, resp. rate 16, height 5' 4.5" (1.638 m), weight 83.1 kg (183 lb 3.2 oz), last menstrual period 06/12/2015, SpO2 97 %. No results found. Results for orders placed or performed during the hospital encounter of 06/14/15 (from the past 72 hour(s))  CBC     Status: Abnormal   Collection Time: 06/24/15 10:45 AM  Result Value Ref Range   WBC 6.3 4.0 - 10.5 K/uL   RBC 4.10 3.87 - 5.11 MIL/uL   Hemoglobin 12.0 12.0 - 15.0 g/dL   HCT 36.9 36.0 - 46.0 %   MCV 90.0 78.0 - 100.0 fL   MCH 29.3 26.0 - 34.0 pg   MCHC 32.5 30.0 - 36.0 g/dL   RDW 13.9 11.5 - 15.5 %   Platelets 462 (H) 150 - 400 K/uL     HEENT: normal Cardio: RRR Resp: CTA B/L and unlabored GI: BS positive and NT, ND Extremity:  No Edema Skin:   Intact Neuro: Alert/Oriented, + dysarthria Abnormal Sensory reduced in Left foot, Abnormal Motor 3- Left delt bi tri grip, 3- L HF, 2- L KE,  Trace R ankle, Abnormal FMC Ataxic/ dec FMC and Tone  Hypertonia, Tone:  Hypertonia and Inattention Musc/Skel:  Other no pain with UE or LE ROM, neg impingement at shoulder   Assessment/Plan: 1. Functional deficits secondary to RIght MCA infarct which require 3+ hours per day of interdisciplinary therapy in a comprehensive inpatient rehab setting. Physiatrist is providing close team supervision and 24 hour management of active medical problems listed below. Physiatrist and rehab team continue to assess barriers to discharge/monitor patient progress toward functional and medical goals. Team conference today please see physician documentation under team conference tab, met with team face-to-face to discuss problems,progress, and goals. Formulized  individual treatment plan based on medical history, underlying problem and comorbidities.  FIM: Function - Bathing Position: Shower Body parts bathed by patient: Right arm, Left arm, Chest, Abdomen, Front perineal area, Right upper leg, Left upper leg, Right lower leg, Left lower leg, Back, Buttocks Body parts bathed by helper: Buttocks Assist Level: Supervision or verbal cues  Function- Upper Body Dressing/Undressing What is the patient wearing?: Bra, Pull over shirt/dress Bra - Perfomed by patient: Thread/unthread right bra strap, Thread/unthread left bra strap Bra - Perfomed by helper: Hook/unhook bra (pull down sports bra) Pull over shirt/dress - Perfomed by patient: Thread/unthread right sleeve, Put head through opening, Thread/unthread left sleeve, Pull shirt over trunk Pull over shirt/dress - Perfomed by helper: Thread/unthread left sleeve, Pull shirt over trunk Assist Level: Touching or steadying assistance(Pt > 75%) Set up : To obtain clothing/put away Function - Lower Body Dressing/Undressing What is the patient wearing?: Pants, Socks, Shoes, AFO, Underwear Position: Wheelchair/chair at sink Underwear - Performed by patient: Thread/unthread right underwear leg, Thread/unthread left underwear leg Underwear - Performed by helper: Pull underwear up/down Pants- Performed by patient: Thread/unthread right pants leg, Thread/unthread left pants leg Pants- Performed by helper: Pull pants up/down Non-skid slipper socks- Performed by patient: Don/doff right sock Non-skid slipper socks- Performed by helper: Don/doff left sock Socks - Performed by patient: Don/doff left sock Socks - Performed by helper: Don/doff right sock Shoes - Performed by patient: Don/doff right shoe Shoes -  Performed by helper: Don/doff left shoe AFO - Performed by helper: Don/doff left AFO Assist for footwear: Partial/moderate assist Assist for lower body dressing: Touching or steadying assistance (Pt >  75%)  Function - Toileting Toileting steps completed by patient: Performs perineal hygiene Toileting steps completed by helper: Adjust clothing after toileting, Adjust clothing prior to toileting Toileting Assistive Devices: Grab bar or rail Assist level: Touching or steadying assistance (Pt.75%)  Function - Air cabin crew transfer assistive device: Elevated toilet seat/BSC over toilet Mechanical lift: Stedy Assist level to toilet: Touching or steadying assistance (Pt > 75%) Assist level from toilet: Moderate assist (Pt 50 - 74%/lift or lower) Assist level to bedside commode (at bedside): Maximal assist (Pt 25 - 49%/lift and lower) Assist level from bedside commode (at bedside): Maximal assist (Pt 25 - 49%/lift and lower)  Function - Chair/bed transfer Chair/bed transfer method: Stand pivot Chair/bed transfer assist level: Touching or steadying assistance (Pt > 75%) Chair/bed transfer assistive device: Armrests Chair/bed transfer details: Verbal cues for safe use of DME/AE, Verbal cues for technique, Manual facilitation for placement  Function - Locomotion: Wheelchair Will patient use wheelchair at discharge?: Yes Type: Manual Max wheelchair distance: 150 Assist Level: Supervision or verbal cues Assist Level: Supervision or verbal cues Wheel 150 feet activity did not occur: Safety/medical concerns Assist Level: Supervision or verbal cues Turns around,maneuvers to table,bed, and toilet,negotiates 3% grade,maneuvers on rugs and over doorsills: No Function - Locomotion: Ambulation Assistive device: Cane-quad, Orthosis Max distance: 40' Assist level: Moderate assist (Pt 50 - 74%) Assist level: Moderate assist (Pt 50 - 74%) Walk 50 feet with 2 turns activity did not occur: Safety/medical concerns Assist level: Moderate assist (Pt 50 - 74%) Walk 150 feet activity did not occur: Safety/medical concerns Walk 10 feet on uneven surfaces activity did not occur: Safety/medical  concerns  Function - Comprehension Comprehension: Auditory Comprehension assist level: Understands complex 90% of the time/cues 10% of the time  Function - Expression Expression: Verbal Expression assist level: Expresses basic 75 - 89% of the time/requires cueing 10 - 24% of the time. Needs helper to occlude trach/needs to repeat words.  Function - Social Interaction Social Interaction assist level: Interacts appropriately with others with medication or extra time (anti-anxiety, antidepressant).  Function - Problem Solving Problem solving assist level: Solves basic problems with no assist  Function - Memory Memory assist level: Recognizes or recalls 75 - 89% of the time/requires cueing 10 - 24% of the time Patient normally able to recall (first 3 days only): That he or she is in a hospital, Current season, Location of own room, Staff names and faces    Medical Problem List and Plan: 1. Functional deficits secondary to right MCA infarct with history of prior CVA 2008 as well as 2014, left hemiparesis, no change in motor exam this am, Plavix for CVA prophyllaxis      2.  DVT Prophylaxis/Anticoagulation: SCDs. Venous Doppler is negative 3. Pain Management: Tylenol as needed, hemiplegic shoulder pain- sportscreme and Kpad, positioning, post CVA headache, may need to start topamax 4. Dysphagia. Improved on regular diet. Monitor for any signs of aspiration. Follow-up speech therapy 5. Neuropsych: This patient is capable of making decisions on her own behalf. 6. Skin/Wound Care: Routine skin checks 7. Fluids/Electrolytes/Nutrition: Routine I and Os with follow-up chemistries, back on regular diet with thin liquids, appetite improved 8. Hyperlipidemia. Continue Pravachol 9. Hypertension. Norvasc 2.5 mg daily will d/c, Cozaar 100 mg daily, HCTZ 12.5 mg daily. Monitor with increased mobility,  controlled ,No  orthostatic drop, 99/69, d/c norvasc 10. History of asthma. Patient on albuterol inhaler  as needed prior to admission 11.  Anemia- Fe studies ok, 1 neg and 2 pos stool guaic one pos during menstruation, monitor, recheck CBC shows normal Hgb, can f/u GI as outpt 12.  Stroke related gait d/o max A 40' amb LOS (Days) 13 A FACE TO FACE EVALUATION WAS PERFORMED  KIRSTEINS,ANDREW E 06/27/2015, 6:42 AM

## 2015-06-27 NOTE — Progress Notes (Signed)
Speech Language Pathology Daily Session Note  Patient Details  Name: Rebecca Yoder MRN: [id number] Date of Birth: 1966/06/17  Today's Date: 06/27/2015 SLP Individual Time: [id number] SLP Individual Time Calculation (min): 40 min  Short Term Goals: Week 2: SLP Short Term Goal 1 (Week 2): Pt will utilize increased vocal intensity, pacing, and overarticulation to achieve intelligibility in sentences with supervision cues.  SLP Short Term Goal 2 (Week 2): Pt will return demonstration of at least 2 safety precautions during functional tasks over 3 targeted sessions with supervision.  SLP Short Term Goal 3 (Week 2): Pt will consume dys 3 textures and thin liquids with mod I to monitor and correct left sided buccal residue over 2 targeted sessions prior to diet advancement.   SLP Short Term Goal 4 (Week 2): Pt will consume regular textures with supervision cues to monitor and correct left sided buccal residue over 2 targeted session prior to diet advancement..    Skilled Therapeutic Interventions:  Pt was seen for skilled ST targeting communication goals.  SLP facilitated the session with a semi-complex card game to address use of dysarthria strategies in conversations with distractions.  Pt was >80% intelligible in conversations with overall supervision cues for use of increased vocal intensity and overarticulation.  Pt was returned to room and left in bed with call bell within reach and bed alarm set.  Continue per current plan of care.    Function:  Eating     Cognition Comprehension Comprehension assist level: Understands complex 90% of the time/cues 10% of the time  Expression   Expression assist level: Expresses basic 75 - 89% of the time/requires cueing 10 - 24% of the time. Needs helper to occlude trach/needs to repeat words.  Social Interaction Social Interaction assist level: Interacts appropriately with others with medication or extra time (anti-anxiety, antidepressant).  Problem  Solving Problem solving assist level: Solves basic 90% of the time/requires cueing < 10% of the time  Memory Memory assist level: Recognizes or recalls 75 - 89% of the time/requires cueing 10 - 24% of the time    Pain Pain Assessment Pain Assessment: No/denies pain  Therapy/Group: Individual Therapy  Rebecca Yoder, Selinda Orion 06/27/2015, 4:09 PM

## 2015-06-27 NOTE — Progress Notes (Signed)
Occupational Therapy Session Note  Patient Details  Name: SOLANA MACHAMER MRN: [id number] Date of Birth: 06/18/1966  Today's Date: 06/27/2015 OT Individual Time: 1100-1200 OT Individual Time Calculation (min): 60 min    Short Term Goals: Week 2:  OT Short Term Goal 1 (Week 2): Pt will don shirt with min A    OT Short Term Goal 2 (Week 2): Pt will don pants with min A to steady her balance.   OT Short Term Goal 3 (Week 2): Pt will don bra with Mod A OT Short Term Goal 4 (Week 2): Pt will complete toileting with min assist to manage clothing OT Short Term Goal 5 (Week 2): Pt will incorporate LUE as gross assist during selected dressing task  Skilled Therapeutic Interventions/Progress Updates:    Pt engaged in BADL retraining including bathing at shower level and dressing with sit<>stand from w/c at sink.  Pt requires min verbal cues for LLE positioning prior to functional transfers and sit<>stand when pulling up pants.  Pt required more than a reasonable amount of time to complete all tasks but initiated all tasks independently.  Pt required min verbal cues for LUE positioning when not engaged in functional task.  Pt used AE appropriately in shower to assist with bathing tasks.  Focus on activity tolerance, sit<>stand, standing balance, functional transfers, increased LUE use for functional tasks, and safety awareness to increase independence with BADLs.  Therapy Documentation Precautions:  Precautions Precautions: Fall Precaution Comments: left hemiparesis, decr L awareness, impulsive Restrictions Weight Bearing Restrictions: No General:   Vital Signs:   Pain: Pain Assessment Pain Assessment: No/denies pain ADL: ADL ADL Comments: refer to functional navigator Exercises:   Other Treatments:    See Function Navigator for Current Functional Status.   Therapy/Group: Individual Therapy  Leroy Libman 06/27/2015, 12:00 PM

## 2015-06-27 NOTE — Patient Care Conference (Signed)
Inpatient RehabilitationTeam Conference and Plan of Care Update Date: 06/27/2015   Time: 2:00 PM    Patient Name: Rebecca Yoder      Medical Record Number: [id number]  Date of Birth: 01-08-1966 Sex: Female         Room/Bed: 4M02C/4M02C-01 Payor Info: Payor: MEDICAID PENDING / Plan: MEDICAID PENDING / Product Type: *No Product type* /    Admitting Diagnosis: R CVA  Admit Date/Time:  06/14/2015  4:04 PM Admission Comments: No comment available   Primary Diagnosis:  <principal problem not specified> Principal Problem: <principal problem not specified>  Patient Active Problem List   Diagnosis Date Noted  . Right middle cerebral artery stroke ([place]) 06/14/2015  . Left hemiparesis ([place])   . Left-sided neglect   . Acute left hemiparesis ([place]) 06/13/2015  . Hemi-neglect of left side 06/13/2015  . Stroke (cerebrum) ([place]) 06/11/2015  . TIA (transient ischemic attack) 06/09/2013  . Hypokalemia 06/09/2013  . PFO (patent foramen ovale) 06/09/2013  . CVA (cerebral infarction) 06/09/2013  . Hyperlipidemia 12/10/2009  . Essential hypertension 12/10/2009  . Asthma 12/10/2009  . CEREBROVASCULAR ACCIDENT, HX OF 12/10/2009    Expected Discharge Date: Expected Discharge Date: 07/04/15  Team Members Present: Physician leading conference: Dr. Delice Lesch Social Worker Present: Lennart Pall, LCSW Nurse Present: Dorien Chihuahua, RN PT Present: Guilford Shi, PT OT Present: Willeen Cass, OT SLP Present: Windell Moulding, SLP PPS Coordinator present : Daiva Nakayama, RN, CRRN     Current Status/Progress Goal Weekly Team Focus  Medical    Right MCA with left hemiparesis  safety awareness, transfer education, gait education  Stablize BP   Bowel/Bladder   Continent to bladder and bowel.  To continue continent to B & B with Mod. I.  To monitor B & B function Q shift.   Swallow/Nutrition/ Hydration   Regular textures, thin liquids, intermittent supervision   mod I   diet toleration    ADL's   supervision UB self care (occasional min A with sports bra), min-mod A LB, min-mod A transfers  Overall supervision for BADL and light homemaking, Mod I self-feeding and dynamic sitting balance  NMR LUE, transfers, adapted ADLs, static/dynamic standing balance, pt/ family education   Mobility   min-mod A transfers, min-max A gait with AFO and WBQC, supervision w/c mobility  all goals dropped to supervision, except min A gait on level surface & stairs  safety with mobility, motor planning/sequencing transfers, gait, stair negotiation   Communication   mild dysarthria, improving use of compensatory strategies   supervision   continue to address carryover for use of dysarthria strategies    Safety/Cognition/ Behavioral Observations  improved impulsivity but continues to require quick release belt   supervision   continue to address safety awareness and emergent awareness of deficits    Pain   Complains of headache.Using Tylenol 650 mg. Q 4 hrs.  To keep pain levels less than 3,on scale 1 to 10.  To assess for pain levels Q 2-3 hrs.   Skin   Right stump with daily dressing change.  To keep incision free from infections,and skin free from breakdowns.       Rehab Goals Patient on target to meet rehab goals: Yes *See Care Plan and progress notes for long and short-term goals.  Barriers to Discharge: Hypotensive episodes    Possible Resolutions to Barriers:  Optimize meds, education, and stroke training    Discharge Planning/Teaching Needs:  Plan for pt to d/c home with sister, [name].  Family has arranged 24/7 assistance at home.  Sister reports she began training this past weekend and agreeable to continue as much as team feels necessary.   Team Discussion:  Still impulsive and can really only focus on one activity at a time.  PT recommends an AFO and will then trial stairs with this (to determine if ramp needed or not).  Still needs a lot of cues for safety and to attend to her left  side.  Currently steady assist with transfers, however, ranges with ambulation based on attention and impusivity.  SW to follow with sister about caregivers and education.  On Reg diet now.  Revisions to Treatment Plan:  NA   Continued Need for Acute Rehabilitation Level of Care: The patient requires daily medical management by a physician with specialized training in physical medicine and rehabilitation for the following conditions: Daily direction of a multidisciplinary physical rehabilitation program to ensure safe treatment while eliciting the highest outcome that is of practical value to the patient.: Yes Daily medical management of patient stability for increased activity during participation in an intensive rehabilitation regime.: Yes Daily analysis of laboratory values and/or radiology reports with any subsequent need for medication adjustment of medical intervention for : Neurological problems  Rebecca Yoder 06/27/2015, 3:10 PM

## 2015-06-27 NOTE — Progress Notes (Signed)
Orthopedic Tech Progress Note Patient Details:  Rebecca Yoder April 30, 1966 [id number]  Patient ID: Rebecca Yoder, female   DOB: [birth year], 49 y.o.   MRN: [id number] Called in advanced brace order; spoke with Ramonita Lab, Rebecca Yoder 06/27/2015, 2:27 PM

## 2015-06-27 NOTE — Progress Notes (Signed)
Speech Language Pathology Daily Session Note  Patient Details  Name: SANAIYA TIBBITS MRN: [id number] Date of Birth: Oct 28, 1965  Today's Date: 06/27/2015 SLP Individual Time: 1010-1037 SLP Individual Time Calculation (min): 27 min  Short Term Goals: Week 2: SLP Short Term Goal 1 (Week 2): Pt will utilize increased vocal intensity, pacing, and overarticulation to achieve intelligibility in sentences with supervision cues.  SLP Short Term Goal 2 (Week 2): Pt will return demonstration of at least 2 safety precautions during functional tasks over 3 targeted sessions with supervision.  SLP Short Term Goal 3 (Week 2): Pt will consume dys 3 textures and thin liquids with mod I to monitor and correct left sided buccal residue over 2 targeted sessions prior to diet advancement.   SLP Short Term Goal 4 (Week 2): Pt will consume regular textures with supervision cues to monitor and correct left sided buccal residue over 2 targeted session prior to diet advancement..    Skilled Therapeutic Interventions:  Pt was seen for skilled ST targeting communication goals.  Pt was in bed upon arrival and was transferred to wheelchair with assistance from nurse and nurse tech to maximize participation in therapies.  Pt was transferred on and off commode with assistance from nurse tech and with min assist verbal cues for safety awareness due to impulsivity.   Pt returned demonstration of how to apply and release brakes when returned to wheelchair with supervision question cues.  SLP facilitated the session with a phone task targeting use of dysarthria strategies in a more challenging context.  Pt utilized increased vocal intensity and slow rate to order her lunch over the phone with supervision cues.  Pt was left upright in wheelchair with quick release belt donned and call bell left within reach.  Continue per current plan of care.    Function:  Eating Eating                 Cognition Comprehension  Comprehension assist level: Understands complex 90% of the time/cues 10% of the time  Expression   Expression assist level: Expresses basic 75 - 89% of the time/requires cueing 10 - 24% of the time. Needs helper to occlude trach/needs to repeat words.  Social Interaction Social Interaction assist level: Interacts appropriately with others with medication or extra time (anti-anxiety, antidepressant).  Problem Solving Problem solving assist level: Solves basic 90% of the time/requires cueing < 10% of the time  Memory Memory assist level: Recognizes or recalls 75 - 89% of the time/requires cueing 10 - 24% of the time    Pain Pain Assessment Pain Assessment: No/denies pain  Therapy/Group: Individual Therapy  Mariabelen Pressly, Selinda Orion 06/27/2015, 11:26 AM

## 2015-06-27 NOTE — Progress Notes (Signed)
Orthopedic Tech Progress Note Patient Details:  Rebecca Yoder Jul 31, 1966 [id number]  Patient ID: Rebecca Yoder, female   DOB: 1965-08-19, 49 y.o.   MRN: [id number] Called in advanced brace order; spoke with Ramonita Lab, Iesha [place]hill 06/27/2015, 2:29 PM

## 2015-06-27 NOTE — Progress Notes (Signed)
Physical Therapy Session Note  Patient Details  Name: Rebecca Yoder MRN: [id number] Date of Birth: [birth year]  Today's Date: 06/27/2015 PT Individual Time: 1300-1400 PT Individual Time Calculation (min): 60 min   Short Term Goals: Week 2:  PT Short Term Goal 1 (Week 2): Patient to ambulate with QC 75' min/mod A. PT Short Term Goal 2 (Week 2): Patient to transfer consistently with min A sit to stand and bed<>w/c. PT Short Term Goal 3 (Week 2): Patient to be independent with w/c set up prior to transfers with min questioning cues. PT Short Term Goal 4 (Week 2): Patient to negotiate 4 steps with rail mod A PT Short Term Goal 5 (Week 2): Patient to perform standing dynamic activities with min A at least 50% of the time.  Skilled Therapeutic Interventions/Progress Updates:    Pt received in [place] after toileting with RN, agreeable to PT session. Gait Training - PT instructs pt in ambulation with L AFO and WBQC 75' x 2 reps - focus on larger R step length and increasing stability (knee/hip extension) on support L leg during R swing. Pt is unable to focus on 2 things at the same time, in relation to gait training, due to cognitive deficits. Neuromuscular Reeducation - PT instructs pt in forced use of L LE during sit to stand, by placing R leg on step, and removing AFO support to improve activation of ankle muscles: 2 x 10 reps with armrests of w/c, marching R leg up/down with PT blocking L knee (buckling each episode) with [facility] for balance support: 2 x 10 reps, mini-squats with R leg on low step with PT giving pt verbal cues to increase weight shift over L LE: 2 x 10 reps each. Pt ended up in w/c with quick release belt in place and all needs in reach. Pt is progressing with PT, but will need supervision with all mobility due to cognitive deficits and impulsiveness, so all goals downgraded to supervision. MD agrees to write order for pt's personal AFO. Continue per PT pOC.   Therapy  Documentation Precautions:  Precautions Precautions: Fall Precaution Comments: left hemiparesis, decr L awareness, impulsive Restrictions Weight Bearing Restrictions: No Pain: Pain Assessment Pain Assessment: No/denies pain  See Function Navigator for Current Functional Status.   Therapy/Group: Individual Therapy  Xander Jutras M 06/27/2015, 1:22 PM

## 2015-06-28 ENCOUNTER — Inpatient Hospital Stay (HOSPITAL_COMMUNITY): Payer: Medicaid Other

## 2015-06-28 ENCOUNTER — Inpatient Hospital Stay (HOSPITAL_COMMUNITY): Payer: Medicaid Other | Admitting: Speech Pathology

## 2015-06-28 ENCOUNTER — Inpatient Hospital Stay (HOSPITAL_COMMUNITY): Payer: Medicaid Other | Admitting: Physical Therapy

## 2015-06-28 NOTE — Progress Notes (Signed)
Patient ID: Rebecca Yoder, female   DOB: [birth year], 49 y.o.   MRN: [id number]   Subjective/Complaints: No pains this am, still with Left foot/ankle weakness , using AFOROS:  - CP, -SOB, - Joint pains  Objective: Vital Signs: Blood pressure 105/60, pulse 79, temperature 98 F (36.7 C), temperature source Oral, resp. rate 16, height 5' 4.5" (1.638 m), weight 82.555 kg (182 lb), last menstrual period 06/12/2015, SpO2 97 %. No results found. No results found for this or any previous visit (from the past 72 hour(s)).   HEENT: normal Cardio: RRR Resp: CTA B/L and unlabored GI: BS positive and NT, ND Extremity:  No Edema Skin:   Intact Neuro: Alert/Oriented, + dysarthria Abnormal Sensory reduced in Left foot, Abnormal Motor 3- Left delt bi tri grip, 3- L HF, 2- L KE,  Trace R ankle, Abnormal FMC Ataxic/ dec FMC and Tone  Hypertonia, Tone:  Hypertonia and Inattention Musc/Skel:  Other no pain with UE or LE ROM, neg impingement at shoulder   Assessment/Plan: 1. Functional deficits secondary to RIght MCA infarct which require 3+ hours per day of interdisciplinary therapy in a comprehensive inpatient rehab setting. Physiatrist is providing close team supervision and 24 hour management of active medical problems listed below. Physiatrist and rehab team continue to assess barriers to discharge/monitor patient progress toward functional and medical goals.   FIM: Function - Bathing Position: Shower Body parts bathed by patient: Right arm, Left arm, Chest, Abdomen, Front perineal area, Right upper leg, Left upper leg, Right lower leg, Left lower leg, Back, Buttocks Body parts bathed by helper: Buttocks Assist Level: Set up, Supervision or verbal cues Set up : To obtain items  Function- Upper Body Dressing/Undressing What is the patient wearing?: Bra, Pull over shirt/dress Bra - Perfomed by patient: Thread/unthread right bra strap, Thread/unthread left bra strap Bra - Perfomed by helper:  Hook/unhook bra (pull down sports bra) Pull over shirt/dress - Perfomed by patient: Thread/unthread right sleeve, Put head through opening, Thread/unthread left sleeve, Pull shirt over trunk Pull over shirt/dress - Perfomed by helper: Thread/unthread left sleeve, Pull shirt over trunk Assist Level: Set up, Supervision or verbal cues Set up : To obtain clothing/put away Function - Lower Body Dressing/Undressing What is the patient wearing?: Underwear, Pants, Non-skid slipper socks Position: Wheelchair/chair at sink Underwear - Performed by patient: Thread/unthread right underwear leg, Thread/unthread left underwear leg Underwear - Performed by helper: Pull underwear up/down Pants- Performed by patient: Thread/unthread right pants leg, Thread/unthread left pants leg Pants- Performed by helper: Pull pants up/down Non-skid slipper socks- Performed by patient: Don/doff right sock Non-skid slipper socks- Performed by helper: Don/doff right sock, Don/doff left sock Socks - Performed by patient: Don/doff left sock Socks - Performed by helper: Don/doff right sock Shoes - Performed by patient: Don/doff right shoe Shoes - Performed by helper: Don/doff left shoe AFO - Performed by helper: Don/doff left AFO Assist for footwear: Partial/moderate assist Assist for lower body dressing: Touching or steadying assistance (Pt > 75%)  Function - Toileting Toileting steps completed by patient: Performs perineal hygiene Toileting steps completed by helper: Adjust clothing prior to toileting, Adjust clothing after toileting Toileting Assistive Devices: Grab bar or rail Assist level: Touching or steadying assistance (Pt.75%)  Function - Air cabin crew transfer assistive device: Elevated toilet seat/BSC over toilet, Grab bar, Walker Mechanical lift: Stedy Assist level to toilet: Moderate assist (Pt 50 - 74%/lift or lower) Assist level from toilet: Moderate assist (Pt 50 - 74%/lift or lower) Assist  level to bedside commode (at bedside): Maximal assist (Pt 25 - 49%/lift and lower) Assist level from bedside commode (at bedside): Maximal assist (Pt 25 - 49%/lift and lower)  Function - Chair/bed transfer Chair/bed transfer method: Ambulatory Chair/bed transfer assist level: Moderate assist (Pt 50 - 74%/lift or lower) (min A, but LOB req mod assist to correct) Chair/bed transfer assistive device: Armrests, Walker Chair/bed transfer details: Manual facilitation for weight shifting, Verbal cues for safe use of DME/AE, Verbal cues for technique, Manual facilitation for placement  Function - Locomotion: Wheelchair Will patient use wheelchair at discharge?: Yes Type: Manual Max wheelchair distance: 150' Assist Level: Supervision or verbal cues Assist Level: Supervision or verbal cues Wheel 150 feet activity did not occur: Safety/medical concerns Assist Level: Supervision or verbal cues Turns around,maneuvers to table,bed, and toilet,negotiates 3% grade,maneuvers on rugs and over doorsills: No Function - Locomotion: Ambulation Assistive device: Walker-rolling Max distance: 20' x 2 reps Assist level: Maximal assist (Pt 25 - 49%) (min - max A pending balance) Assist level: Touching or steadying assistance (Pt > 75%) Walk 50 feet with 2 turns activity did not occur: Safety/medical concerns Assist level: Maximal assist (Pt 25 - 49%) Walk 150 feet activity did not occur: Safety/medical concerns Walk 10 feet on uneven surfaces activity did not occur: Safety/medical concerns  Function - Comprehension Comprehension: Auditory Comprehension assist level: Understands complex 90% of the time/cues 10% of the time  Function - Expression Expression: Verbal Expression assist level: Expresses basic 75 - 89% of the time/requires cueing 10 - 24% of the time. Needs helper to occlude trach/needs to repeat words.  Function - Social Interaction Social Interaction assist level: Interacts appropriately with  others with medication or extra time (anti-anxiety, antidepressant).  Function - Problem Solving Problem solving assist level: Solves basic 90% of the time/requires cueing < 10% of the time  Function - Memory Memory assist level: Recognizes or recalls 75 - 89% of the time/requires cueing 10 - 24% of the time Patient normally able to recall (first 3 days only): That he or she is in a hospital, Current season, Location of own room, Staff names and faces    Medical Problem List and Plan: 1. Functional deficits secondary to right MCA infarct with history of prior CVA 2008 as well as 2014, left hemiparesis, no change in motor exam this am, Plavix for CVA prophyllaxis  Discussed focus on return to home with [facility] next week                                                           2.  DVT Prophylaxis/Anticoagulation: SCDs. Venous Doppler is negative 3. Pain Management: Tylenol as needed, hemiplegic shoulder pain- sportscreme and Kpad, positioning, post CVA headache, may need to start topamax 4. Dysphagia. Improved on regular diet. Monitor for any signs of aspiration. Follow-up speech therapy 5. Neuropsych: This patient is capable of making decisions on her own behalf. 6. Skin/Wound Care: Routine skin checks 7. Fluids/Electrolytes/Nutrition: Routine I and Os with follow-up chemistries, back on regular diet with thin liquids, appetite improved 8. Hyperlipidemia. Continue Pravachol 9. Hypertension. Norvasc 2.5 mg daily will d/c, Cozaar 100 mg daily, HCTZ 12.5 mg daily. Monitor with increased mobility, controlled ,No  orthostatic drop, 99/69, d/c norvasc now 105/60, expect it to rise over several days 10. History  of asthma. Patient on albuterol inhaler as needed prior to admission 11.  Anemia- Fe studies ok, 1 neg and 2 pos stool guaic one pos during menstruation, monitor, recheck CBC shows normal Hgb, can f/u GI as outpt 12.  Stroke related gait using AFO LOS (Days) 14 A FACE TO FACE EVALUATION WAS  PERFORMED  Rebecca Yoder 06/28/2015, 6:56 AM

## 2015-06-28 NOTE — Progress Notes (Signed)
Occupational Therapy Session Note  Patient Details  Name: Rebecca Yoder MRN: [id number] Date of Birth: 08-24-1965  Today's Date: 06/28/2015 OT Individual Time:1000-1130 90 Minutes  Short Term Goals: Week 2:  OT Short Term Goal 1 (Week 2): Pt will don shirt with min A    OT Short Term Goal 2 (Week 2): Pt will don pants with min A to steady her balance.   OT Short Term Goal 3 (Week 2): Pt will don bra with Mod A OT Short Term Goal 4 (Week 2): Pt will complete toileting with min assist to manage clothing OT Short Term Goal 5 (Week 2): Pt will incorporate LUE as gross assist during selected dressing task  Skilled Therapeutic Interventions/Progress Updates: ADL-retraining (60 min)  at shower level with focus on reinforcement training on weight-shifting during transfers, improved attention to left, dynamic sitting balance and adapted dressing skills.   Pt received supine in bed but able to complete bed mobility using HOB elevated and bed rails to rise to set at EOB.   Pt requires repeated cues for technique d/t only fair carry-over of training.   Pt requires extra time and manual placement of feet prior to transfer off of bed to w/c.   Pt requires setup assist to place w/c correctly in bathroom but only supervision to transfer to tub bench.   Pt bathes unassisted using LH sponge to reach back and feet.   Pt required overall mod assist to dress during this session d/t expedient dressing needed to support facility technician's need to execute plumbing repair.  Therapeutic activity (30 min) with focus on sit-stand, dynamic standing, and safety awareness.  Pt completes 1 load of laundry with standby assist while standing at washing machine.  Pt returns to w/c at end of session with all needs within reach.     Therapy Documentation Precautions:  Precautions Precautions: Fall Precaution Comments: left hemiparesis, decr L awareness, impulsive Restrictions Weight Bearing Restrictions: No  Vital  Signs: Therapy Vitals Temp: 98 F (36.7 C) Temp Source: Oral Pulse Rate: 79 Resp: 16 BP: 105/60 mmHg Patient Position (if appropriate): Lying Oxygen Therapy SpO2: 97 % O2 Device: Not Delivered   Pain: No/denies pain   ADL: ADL ADL Comments: refer to functional navigator  See Function Navigator for Current Functional Status.   Therapy/Group: Individual Therapy  [name] 06/28/2015, 7:15 AM

## 2015-06-28 NOTE — Plan of Care (Signed)
Problem: RH BOWEL ELIMINATION Goal: RH STG MANAGE BOWEL W/MEDICATION W/ASSISTANCE STG Manage Bowel with Medication with min Assistance.  Outcome: Progressing Patient given sorbitol

## 2015-06-28 NOTE — Progress Notes (Signed)
Speech Language Pathology Daily Session Note  Patient Details  Name: Rebecca Yoder MRN: [id number] Date of Birth: October 18, 1965  Today's Date: 06/28/2015 SLP Individual Time: 0801-0900 SLP Individual Time Calculation (min): 59 min  Short Term Goals: Week 2: SLP Short Term Goal 1 (Week 2): Pt will utilize increased vocal intensity, pacing, and overarticulation to achieve intelligibility in sentences with supervision cues.  SLP Short Term Goal 2 (Week 2): Pt will return demonstration of at least 2 safety precautions during functional tasks over 3 targeted sessions with supervision.  SLP Short Term Goal 3 (Week 2): Pt will consume dys 3 textures and thin liquids with mod I to monitor and correct left sided buccal residue over 2 targeted sessions prior to diet advancement.   SLP Short Term Goal 4 (Week 2): Pt will consume regular textures with supervision cues to monitor and correct left sided buccal residue over 2 targeted session prior to diet advancement..    Skilled Therapeutic Interventions:  Pt was seen for skilled ST targeting goals for communication and dysphagia.  Pt consumed presentations of her currently prescribed diet with no overt s/s of aspiration and mod I use of swallowing precautions to monitor and correct oral residue.  Pt demonstrated complete clearance of solids from the oral cavity post swallow in a timely manner.  Recommend that pt continue on regular solids and thin liquids with intermittent supervision for use of swallowing precautions.  SLP facilitated the session with a novel barrier task targeting use of dysarthria strategies.  Door to pt's room was left open to increase task challenge with environmental noise and distractions.  Pt utilized increased vocal intensity and slow rate to achieve intelligibility during the abovementioned task with min assist-supervision verbal cues.  Pt was left upright in wheelchair with quick release belt donned and call bell left within reach.   Continue per current plan of care.    Function:  Eating Eating Eating activity did not occur: N/A Modified Consistency Diet: No Eating Assist Level: More than reasonable amount of time;Set up assist for   Eating Set Up Assist For: Opening containers       Cognition Comprehension Comprehension assist level: Understands complex 90% of the time/cues 10% of the time  Expression   Expression assist level: Expresses basic 75 - 89% of the time/requires cueing 10 - 24% of the time. Needs helper to occlude trach/needs to repeat words.  Social Interaction Social Interaction assist level: Interacts appropriately with others with medication or extra time (anti-anxiety, antidepressant).  Problem Solving Problem solving assist level: Solves basic 75 - 89% of the time/requires cueing 10 - 24% of the time  Memory Memory assist level: Recognizes or recalls 50 - 74% of the time/requires cueing 25 - 49% of the time    Pain Pain Assessment Pain Assessment: Faces Faces Pain Scale: Hurts even more Pain Type: Acute pain Pain Location: Head Pain Descriptors / Indicators: Headache Pain Intervention(s): RN made aware  Therapy/Group: Individual Therapy  Riely Oetken, Selinda Orion 06/28/2015, 1:56 PM

## 2015-06-28 NOTE — Progress Notes (Signed)
Social Work Patient ID: [name], female   DOB: Jan 13, 1966, 50 y.o.   MRN: [id number]   Have reviewed team conference with both pt and sister, [name].  Sister expressing concern about making "... Sure she gets all the therapy she can...maybe she needs to go to another rehab center."  Had frank discussion with sister about SNF as a Medicaid pending client (i.e. Placement outside of [place], no therapy coverage in SNF).  Explained that if family is unable to provide 24/7 assistance, then SNF is really the only option, however, would not consider this to be "best" option for pt in terms of ongoing rehab.   Sister reports that family CAN provide the 24/7 care and appreciative of this information.  We discussed the amount of therapy (albeit limited) that I can get for pt at home/ OP center.  Sister reports that she and family will plan to have pt d/c home with them on 11/23.  This SW will assist with arrangement of f/u tx and DME.  Sister to let me know what time of day family to be in on Monday 11/21 to begin family education. Have reviewed all of this information with pt who is pleased with plans to d/c home next week.  Remains very motivated.    Khaleel Beckom, LCSW

## 2015-06-28 NOTE — Progress Notes (Signed)
   06/28/15 2028  What Happened  Was fall witnessed? Yes  Who witnessed fall? Family, sister--Rebecca Yoder  Patients activity before fall other (comment) (patient stood up at sink; see note)  Point of contact buttocks  Was patient injured? No  Follow Up  MD notified yes  Time MD notified 2055 Danella Sensing called 2055; called back 2110)  Family notified Yes-comment (family in room during fall; sister Rebecca Yoder)  Time family notified 2028 (sister in room)  Additional tests No  Progress note created (see row info) Yes  Adult Fall Risk Assessment  Risk Factor Category (scoring not indicated) Fall has occurred during this admission (document High fall risk)  Patient's Fall Risk High Fall Risk (>13 points)  Adult Fall Risk Interventions  Required Bundle Interventions *See Row Information* High fall risk - low, moderate, and high requirements implemented  Additional Interventions Fall risk signage;Individualized elimination schedule;Use of appropriate toileting equipment (bedpan, BSC, etc.)  Fall with Injury Screening  Risk For Fall Injury- See Row Information  Nurse judgement  Vitals  Temp 97.1 F (36.2 C)  Temp Source Oral  BP (!) 143/79 mmHg (RN present for vitals)  BP Location Right Arm  BP Method Automatic  Patient Position (if appropriate) Sitting  Pulse Rate (!) 117 (RN present for vitals)  Pulse Rate Source Dinamap  Resp 19  Oxygen Therapy  SpO2 93 %  O2 Device Room Air  Pain Assessment  Pain Assessment 0-10  Pain Score 0  Neurological  Neuro (WDL) X  Level of Consciousness Alert  Orientation Level Oriented X4  Cognition Follows commands  Speech Slurred/Dysarthria  Pupil Assessment  No  R Hand Grip Strong  L Hand Grip Moderate   R Foot Plantar Flexion Strong  L Foot Plantar Flexion Weak  RUE Sensation Full sensation  RUE Motor Strength 5  LUE Sensation Decreased  LUE Motor Strength 3  RLE Sensation Full sensation  RLE Motor Strength 4  LLE Sensation  Full sensation  LLE Motor Strength 3  Neuro Symptoms None  Neuro Additional Assessments No  Musculoskeletal  Musculoskeletal (WDL) X  Assistive Device Stedy;Wheelchair  Generalized Weakness Yes  Weight Bearing Restrictions No  Musculoskeletal Details  LUE Limited movement;Weakness  LLE Limited movement;Weakness  Integumentary  Integumentary (WDL) WDL (no new skin breakdown, abrasions, ecchymosis, contusions)    NT answered call bell as family rang out.  Once entering, NT called for RN help and second NT came as well.  Patient seen sitting in floor in front of sink; vitals taken: temp 97.1, BP 143/79, HR 117, pulse 19, O2 93; lift equipment used to get patient out of floor.  Patient rated pain 0/10. Once patient in bed, BP 128/78, HR 92, O2 95%, Resp 16.  Patient sister in room at time of fall. Patient was in wheelchair with safety belt on, went to sink to get ready for bed.  Patient removed safety belt, went to stand holding on to ledge of sink with right hand and "became wobbly and just fell on my butt." Patient/sister reports trying to show sister what she has been doing in therapy, sister was discouraging without staff help but patient continued.  Patient and family re-educated about safety and appropriate transfers, with staff present.  MD on call notified 2055 (called back 2110), aware of situation, no new orders received.  Will continue to monitor patient.

## 2015-06-28 NOTE — Progress Notes (Signed)
Physical Therapy Session Note  Patient Details  Name: Rebecca Yoder MRN: [id number] Date of Birth: Feb 12, 1966  Today's Date: 06/28/2015 PT Individual Time: 1300-1400 PT Individual Time Calculation (min): 60 min   Short Term Goals: Week 2:  PT Short Term Goal 1 (Week 2): Patient to ambulate with QC 75' min/mod A. PT Short Term Goal 2 (Week 2): Patient to transfer consistently with min A sit to stand and bed<>w/c. PT Short Term Goal 3 (Week 2): Patient to be independent with w/c set up prior to transfers with min questioning cues. PT Short Term Goal 4 (Week 2): Patient to negotiate 4 steps with rail mod A PT Short Term Goal 5 (Week 2): Patient to perform standing dynamic activities with min A at least 50% of the time.  Skilled Therapeutic Interventions/Progress Updates:    Pt received up in w/c, transferring laundry from washer to dryer with reacher while seated in w/c. Orthotist Tom from [facility] arrives to examine pt's gait and provide recommendations. PT places trial Toe Off AFO on pt and instructs pt in ambulation with [facility] and with RW. Pt demonstrates a step-to gait leading with L LE and req min-max A for balance due to LOB to the L with scissoring/adduction of L leg during swing phase and pt's poor safety awareness to correct without verbal cues from PT for each incidence this occurs. Pt also demonstrates R knee flexion during L swing to progress L LE with gait and slight L knee buckling in stance. PT provides verbal cues to take smaller step with L LE and to take a larger step with R LE, as well as placement cues to put L leg out to the side slightly in order to prevent narrow BOS. Orthotist recommends also using a heel wedge with L AFO and so PT places that, and PT obtains a L hand splint to assist with L hand grip on RW. With these alterations, pt demonstrates improved R step length and improved grip on RW. Stair Training - Pt reports sister's home has stairs with 2 rails. PT instructs pt in  ascending/descending 8 steps (3" height) with one rail, req verbal cues for technique and up to mod A for balance. PT explains to pt that her family needs to be trained in helping her on the stairs and pt reports her boyfriend can probably come and be trained over the weekend. Pt reports she will call her boyfriend and find out what time he can come and PT will notify scheduler once pt tells her. Pt ended up in w/c with quick release belt in place and all needs in reach. RN notified of PT and orthotist's recommendation for a PRAFO boot at night to prevent ankle PF contracture. Continue per PT POC.   Therapy Documentation Precautions:  Precautions Precautions: Fall Precaution Comments: left hemiparesis, decr L awareness, impulsive Restrictions Weight Bearing Restrictions: No   Pain: Pain Assessment Pain Assessment: Faces Pain Score: 5  Faces Pain Scale: Hurts even more Pain Type: Acute pain Pain Location: Head Pain Descriptors / Indicators: Headache Pain Onset: Gradual Pain Intervention(s): RN made aware Multiple Pain Sites: No   See Function Navigator for Current Functional Status.   Therapy/Group: Individual Therapy  Emmily Pellegrin M 06/28/2015, 2:15 PM

## 2015-06-29 ENCOUNTER — Inpatient Hospital Stay (HOSPITAL_COMMUNITY): Payer: Medicaid Other

## 2015-06-29 ENCOUNTER — Inpatient Hospital Stay (HOSPITAL_COMMUNITY): Payer: Medicaid Other | Admitting: Speech Pathology

## 2015-06-29 ENCOUNTER — Inpatient Hospital Stay (HOSPITAL_COMMUNITY): Payer: Medicaid Other | Admitting: Physical Therapy

## 2015-06-29 NOTE — Progress Notes (Signed)
Physical Therapy Weekly Progress Note  Patient Details  Name: Rebecca Yoder MRN: [id number] Date of Birth: 04-30-1966  Beginning of progress report period: June 23, 2015 End of progress report period: June 29, 2015  Today's Date: 06/29/2015 PT Individual Time: 0830-0930 Treatment Session 2: 1405-1430 PT Individual Time Calculation (min): 60 min Treatment Session 2: 25 min   Patient has met 5 of 5 short term goals.  Pt continues to have poor safety awareness including L UE/LE proprioceptive deficits, L hemiplegia, and sustained one fall this week outside of therapy time while "showing off" to her sister, attempting to stand from w/c, per pt report.   Patient continues to demonstrate the following deficits: impaired standing balance, difficulty with bed mobility/transfers/ambulation, low activity tolerance, L hemiplegia and therefore will continue to benefit from skilled PT intervention to enhance overall performance with activity tolerance, balance, postural control, ability to compensate for deficits, functional use of  left upper extremity and left lower extremity, awareness and coordination.  Patient not progressing toward long term goals.  See goal revision..  Plan of care revisions: goals downgraded due to slow progress.  PT Short Term Goals Week 2:  PT Short Term Goal 1 (Week 2): Patient to ambulate with QC 75' min/mod A. PT Short Term Goal 1 - Progress (Week 2): Met PT Short Term Goal 2 (Week 2): Patient to transfer consistently with min A sit to stand and bed<>w/c. PT Short Term Goal 2 - Progress (Week 2): Met PT Short Term Goal 3 (Week 2): Patient to be independent with w/c set up prior to transfers with min questioning cues. PT Short Term Goal 3 - Progress (Week 2): Met PT Short Term Goal 4 (Week 2): Patient to negotiate 4 steps with rail mod A PT Short Term Goal 4 - Progress (Week 2): Met PT Short Term Goal 5 (Week 2): Patient to perform standing dynamic activities  with min A at least 50% of the time. PT Short Term Goal 5 - Progress (Week 2): Met Week 3:  PT Short Term Goal 1 (Week 3): STGs = LTGs due to ELOS  Skilled Therapeutic Interventions/Progress Updates:    Treatment Session 1: Pt received in bed, agreeable to PT. Therapeutic Activity - see function tab for details - PT assists pt in dressing upper body with min A for L arm lacing through armhole, then min A to pull pants over bottom, and total assist to don shoes and L AFO with pt assisting.Pt demonstrates good w/c set up prior to squat pivot transfer req min verbal cues to ensure w/c is close to the bed, and SBA with increased time and verbal cues for sit to supine. Gait Training - PT instructs pt in ambulation x 80' with WBQC and L AFO and heel wedge req min-mod A - pt consistently loses balance to the L and req mod A to correct. Balance worsens with turn to sit in chair. PT instructs pt in ambulation with RW and L hand splint and previously mentioned AFO x 75' req min-mod A - pt better able to correct balance with RW and demonstrates improved balance when turning to sit in chair after gait. Pt ended in bed with bed alarm on.   Treatment Session 2: Pt received up in w/c with quick release belt in place, agreeable to PT. Gait Training - PT instructs pt in ascending with strong leg and descending with L leg x 8 (3" height) steps with one rail req min-mod A for balance. Neuromuscular  Reeducation - PT instructs pt in dynamic standing balance activity: reaching R to place/remove horseshoes from basketball rim req min A through min excursions without UE support. Pt ended in bed to rest. Safety awareness deficits increase pt's falls risk. Family training set for Sunday at 1pm with boyfriend. Continue per PT POC.    Therapy Documentation Precautions:  Precautions Precautions: Fall Precaution Comments: left hemiparesis, decr L awareness, impulsive Restrictions Weight Bearing Restrictions: No Pain: Pain  Assessment Pain Assessment: No/denies pain  Treatment Session 2: Pt denies pain  See Function Navigator for Current Functional Status.  Therapy/Group: Individual Therapy  Marlissa Emerick M 06/29/2015, 8:41 AM

## 2015-06-29 NOTE — Progress Notes (Signed)
Patient ID: Rebecca Yoder, female   DOB: September 06, 1965, 49 y.o.   MRN: [id number]   Subjective/Complaints: [place] on to buttocks while "showing off" to her sister.  Was trying to stand up from [facility].  Denies pain or injury related to fall discussed with RN  ROS:  - CP, -SOB, - Joint pains  Objective: Vital Signs: Blood pressure 113/68, pulse 95, temperature 98.3 F (36.8 C), temperature source Oral, resp. rate 17, height 5' 4.5" (1.638 m), weight 82.555 kg (182 lb), last menstrual period 06/12/2015, SpO2 97 %. No results found. No results found for this or any previous visit (from the past 72 hour(s)).   HEENT: normal Cardio: RRR Resp: CTA B/L and unlabored GI: BS positive and NT, ND Extremity:  No Edema Skin:   Intact Neuro: Alert/Oriented, + dysarthria Abnormal Sensory reduced in Left foot, Abnormal Motor 3- Left delt bi tri grip, 3- L HF, 2- L KE,  Trace R ankle, Abnormal FMC Ataxic/ dec FMC and Tone  Hypertonia, Tone:  Hypertonia and Inattention Musc/Skel:  Other no pain with UE or LE ROM, neg impingement at shoulder   Assessment/Plan: 1. Functional deficits secondary to RIght MCA infarct which require 3+ hours per day of interdisciplinary therapy in a comprehensive inpatient rehab setting. Physiatrist is providing close team supervision and 24 hour management of active medical problems listed below. Physiatrist and rehab team continue to assess barriers to discharge/monitor patient progress toward functional and medical goals.   FIM: Function - Bathing Position: Shower Body parts bathed by patient: Right arm, Left arm, Chest, Abdomen, Front perineal area, Right upper leg, Left upper leg, Right lower leg, Left lower leg, Back, Buttocks Body parts bathed by helper: Buttocks Assist Level: Set up, Supervision or verbal cues Set up : To obtain items  Function- Upper Body Dressing/Undressing What is the patient wearing?: Bra, Pull over shirt/dress Bra - Perfomed by patient:  Thread/unthread right bra strap, Thread/unthread left bra strap Bra - Perfomed by helper: Hook/unhook bra (pull down sports bra) Pull over shirt/dress - Perfomed by patient: Thread/unthread right sleeve, Put head through opening, Thread/unthread left sleeve, Pull shirt over trunk Pull over shirt/dress - Perfomed by helper: Thread/unthread left sleeve, Pull shirt over trunk Assist Level: Set up, Supervision or verbal cues Set up : To obtain clothing/put away Function - Lower Body Dressing/Undressing What is the patient wearing?: Underwear, Pants, Non-skid slipper socks Position: Wheelchair/chair at sink Underwear - Performed by patient: Thread/unthread right underwear leg, Thread/unthread left underwear leg Underwear - Performed by helper: Pull underwear up/down Pants- Performed by patient: Thread/unthread right pants leg, Thread/unthread left pants leg Pants- Performed by helper: Pull pants up/down Non-skid slipper socks- Performed by patient: Don/doff right sock Non-skid slipper socks- Performed by helper: Don/doff right sock, Don/doff left sock Socks - Performed by patient: Don/doff left sock Socks - Performed by helper: Don/doff right sock Shoes - Performed by patient: Don/doff right shoe Shoes - Performed by helper: Don/doff left shoe AFO - Performed by helper: Don/doff left AFO Assist for footwear: Partial/moderate assist Assist for lower body dressing: Touching or steadying assistance (Pt > 75%)  Function - Toileting Toileting steps completed by patient: Performs perineal hygiene Toileting steps completed by helper: Adjust clothing prior to toileting, Adjust clothing after toileting Toileting Assistive Devices: Grab bar or rail Assist level: Touching or steadying assistance (Pt.75%)  Function - Air cabin crew transfer assistive device: Elevated toilet seat/BSC over toilet, Grab bar, Walker Mechanical lift: Stedy Assist level to toilet: Moderate assist (Pt  50 - 74%/lift  or lower) Assist level from toilet: Moderate assist (Pt 50 - 74%/lift or lower) Assist level to bedside commode (at bedside): Maximal assist (Pt 25 - 49%/lift and lower) Assist level from bedside commode (at bedside): Maximal assist (Pt 25 - 49%/lift and lower)  Function - Chair/bed transfer Chair/bed transfer method: Ambulatory Chair/bed transfer assist level: Moderate assist (Pt 50 - 74%/lift or lower) Chair/bed transfer assistive device: Cane, Orthosis, Walker Chair/bed transfer details: Manual facilitation for weight shifting, Verbal cues for safe use of DME/AE, Verbal cues for technique, Manual facilitation for placement  Function - Locomotion: Wheelchair Will patient use wheelchair at discharge?: Yes Type: Manual Max wheelchair distance: 200' Assist Level: Supervision or verbal cues Assist Level: Supervision or verbal cues Wheel 150 feet activity did not occur: Safety/medical concerns Assist Level: Supervision or verbal cues Turns around,maneuvers to table,bed, and toilet,negotiates 3% grade,maneuvers on rugs and over doorsills: No Function - Locomotion: Ambulation Assistive device: Walker-rolling, Orthosis, Cane-quad (WBQC one rep, RW other rep) Max distance: 50' x 2 reps Assist level: Maximal assist (Pt 25 - 49%) (min-max A with LOB to the L) Assist level: Moderate assist (Pt 50 - 74%) Walk 50 feet with 2 turns activity did not occur: Safety/medical concerns Assist level: Maximal assist (Pt 25 - 49%) Walk 150 feet activity did not occur: Safety/medical concerns Walk 10 feet on uneven surfaces activity did not occur: Safety/medical concerns  Function - Comprehension Comprehension: Auditory Comprehension assist level: Understands complex 90% of the time/cues 10% of the time  Function - Expression Expression: Verbal Expression assist level: Expresses basic 75 - 89% of the time/requires cueing 10 - 24% of the time. Needs helper to occlude trach/needs to repeat  words.  Function - Social Interaction Social Interaction assist level: Interacts appropriately with others with medication or extra time (anti-anxiety, antidepressant).  Function - Problem Solving Problem solving assist level: Solves basic 75 - 89% of the time/requires cueing 10 - 24% of the time  Function - Memory Memory assist level: Recognizes or recalls 50 - 74% of the time/requires cueing 25 - 49% of the time Patient normally able to recall (first 3 days only): That he or she is in a hospital, Current season, Location of own room, Staff names and faces    Medical Problem List and Plan: 1. Functional deficits secondary to right MCA infarct with history of prior CVA 2008 as well as 2014, left hemiparesis, no change in motor exam this am, Plavix for CVA prophyllaxis  Discussed focus on return to home with [facility] next week                                                           2.  DVT Prophylaxis/Anticoagulation: SCDs. Venous Doppler is negative 3. Pain Management: Tylenol as needed, hemiplegic shoulder pain- sportscreme and Kpad, positioning, post CVA headache improving 4. Dysphagia. Improved on regular diet. Monitor for any signs of aspiration. Follow-up speech therapy 5. Neuropsych: This patient is capable of making decisions on her own behalf. 6. Skin/Wound Care: Routine skin checks 7. Fluids/Electrolytes/Nutrition: Routine I and Os with follow-up chemistries, back on regular diet with thin liquids, appetite improved 8. Hyperlipidemia. Continue Pravachol 9. Hypertension. Norvasc 2.5 mg daily will d/c, Cozaar 100 mg daily, HCTZ 12.5 mg daily. Monitor with increased mobility, controlled ,  No  orthostatic drop, 99/69, d/c norvasc now 113/68, expect it to rise over several days 10. History of asthma. Patient on albuterol inhaler as needed prior to admission 11.  Anemia- Fe studies ok, 1 neg and 2 pos stool guaic one pos during menstruation, monitor, recheck CBC shows normal Hgb, can f/u GI as  outpt 12.  Stroke related gait using AFO LOS (Days) 15 A FACE TO FACE EVALUATION WAS PERFORMED  KIRSTEINS,ANDREW E 06/29/2015, 6:44 AM

## 2015-06-29 NOTE — Plan of Care (Signed)
Problem: RH BOWEL ELIMINATION Goal: RH STG MANAGE BOWEL W/MEDICATION W/ASSISTANCE STG Manage Bowel with Medication with min Assistance.  Outcome: Progressing Sorbitol given on day shift per patient request

## 2015-06-29 NOTE — Progress Notes (Signed)
Occupational Therapy Weekly Progress Note  Patient Details  Name: Rebecca Yoder MRN: [id number] Date of Birth: [birth year]  Beginning of progress report period: June 22, 2015 End of progress report period: June 29, 2015  Today's Date: 06/29/2015 OT Individual Time: 1030-1130 OT Individual Time Calculation (min): 60 min    Patient has met 1 of 5 short term goals.  Pt is progressing toward 4 of 5 short terms goals established for week 2 however remains inconsistent with performance due to cognitive impairment resulting in limited carry over of skills.  Long term goals downgraded to reflect need for physical and cognitive assist during BADL.  Patient continues to demonstrate the following deficits: Impaired transfer skills, impaired dynamic standing balance, impaired awareness, impaired memory, LUE hemiparesis and therefore will continue to benefit from skilled OT intervention to enhance overall performance with BADL.  Patient not progressing toward long term goals.  See goal revision..  Plan of care revisions: Goals downgraded to reflect overall need for physical assist (min assist) and cognitive assist (cues)..  OT Short Term Goals Week 1:  OT Short Term Goal 1 (Week 1): Pt will transfer to shower bench with steady  A squat pivot transfer. OT Short Term Goal 1 - Progress (Week 1): Met OT Short Term Goal 2 (Week 1): Pt will bathe in shower with steady A using long handled sponge. OT Short Term Goal 2 - Progress (Week 1): Met OT Short Term Goal 3 (Week 1): Pt will don shirt with min A    OT Short Term Goal 3 - Progress (Week 1): Progressing toward goal OT Short Term Goal 4 (Week 1): Pt will don pants with min A to steady her balance.   OT Short Term Goal 4 - Progress (Week 1): Progressing toward goal OT Short Term Goal 5 (Week 1): Pt will be able to maintain static standing balance with min A. OT Short Term Goal 5 - Progress (Week 1): Met Week 2:  OT Short Term Goal 1 (Week 2):  Pt will don shirt with min A    OT Short Term Goal 1 - Progress (Week 2): Progressing toward goal OT Short Term Goal 2 (Week 2): Pt will don pants with min A to steady her balance.   OT Short Term Goal 2 - Progress (Week 2): Progressing toward goal OT Short Term Goal 3 (Week 2): Pt will don bra with Mod A OT Short Term Goal 3 - Progress (Week 2): Met OT Short Term Goal 4 (Week 2): Pt will complete toileting with min assist to manage clothing OT Short Term Goal 4 - Progress (Week 2): Progressing toward goal OT Short Term Goal 5 (Week 2): Pt will incorporate LUE as gross assist during selected dressing task OT Short Term Goal 5 - Progress (Week 2): Progressing toward goal Week 3:  OT Short Term Goal 1 (Week 3): STG=LTG d/t short remaining LOS.  Skilled Therapeutic Interventions/Progress Updates: ADL-retraining with focus on transfers, fall prevention, and adapted dressing skills.  Pt received asleep in bed but alert with min stimulation.   Pt requests to bathe at sink d/t unusual fatigue.   Pt able to perform upper body bathing/dressing with supervision and setup although requiring assist to wash under each arm and apply deoderent during this session.   OT re-assessed performance with stand-pivot transfer and use of quad-tipped cane however pt admits to fearfulness of falling since falling previous night.   Pt continues to require cues to advance feet, shift her weight,  and lean to her right during transfers and she depends on therapist guidance and cues throughout session to problem-solve.   OT advises pt of need for assistance during BADL s/p discharge; pt affirms family members will assist as needed.   OT printed educational material "How to Build a Ramp" for pt's boyfriend who pt states is now planning to construct a ramp for her.   Pt provided and trained pt on use of shoe post to improve donning shoes.   Pt left in w/c at end of session with QRB attached and all needs placed within reach.      Therapy Documentation Precautions:  Precautions Precautions: Fall Precaution Comments: left hemiparesis, decr L awareness, impulsive Restrictions Weight Bearing Restrictions: No  Vital Signs: Therapy Vitals Temp: 98 F (36.7 C) Temp Source: Oral Pulse Rate: 96 Resp: 16 BP: 125/64 mmHg Patient Position (if appropriate): Sitting Oxygen Therapy SpO2: 97 % O2 Device: Not Delivered   Pain: Pain Assessment Pain Assessment: No/denies pain Pain Score: 3    ADL: ADL ADL Comments: refer to functional navigator  See Function Navigator for Current Functional Status.   Therapy/Group: Individual Therapy  [place] 06/29/2015, 12:26 PM

## 2015-06-29 NOTE — Progress Notes (Signed)
Speech Language Pathology Weekly Progress and Session Note  Patient Details  Name: Rebecca Yoder MRN: [id number] Date of Birth: 16-Sep-1965  Beginning of progress report period: November 11,2016 End of progress report period: June 29, 2015  Today's Date: 06/29/2015 SLP Individual Time: 1302-1400 SLP Individual Time Calculation (min): 58 min  Short Term Goals: Week 2: SLP Short Term Goal 1 (Week 2): Pt will utilize increased vocal intensity, pacing, and overarticulation to achieve intelligibility in sentences with supervision cues.  SLP Short Term Goal 1 - Progress (Week 2): Met SLP Short Term Goal 2 (Week 2): Pt will return demonstration of at least 2 safety precautions during functional tasks over 3 targeted sessions with supervision.  SLP Short Term Goal 2 - Progress (Week 2): Progressing toward goal SLP Short Term Goal 3 (Week 2): Pt will consume dys 3 textures and thin liquids with mod I to monitor and correct left sided buccal residue over 2 targeted sessions prior to diet advancement.   SLP Short Term Goal 3 - Progress (Week 2): Met SLP Short Term Goal 4 (Week 2): Pt will consume regular textures with supervision cues to monitor and correct left sided buccal residue over 2 targeted session prior to diet advancement.Marland Kitchen   SLP Short Term Goal 4 - Progress (Week 2): Met    New Short Term Goals: Week 3: SLP Short Term Goal 1 (Week 3): STG=LTG due to remaining length of stay   Weekly Progress Updates:  Pt made functional gains this reporting period and has met 3 out of 4 short term goals.  Pt is consuming regular textures and thin liquids with mod I use of swallowing precautions.  Pt currently requires supervision for use of compensatory dysarthria strategies to achieve intelligibility in sentences/conversations.  Additionally, pt also requires min assist for recall and use of safety precautions due to impulsivity and decreased emergent awareness of deficits.  Pt would continue to  benefit from skilled ST while inpatient.  Also anticipate that pt will require 24/7 supervision at discharge due to cognition.  Pt and family education is ongoing.     Intensity: Minumum of 1-2 x/day, 30 to 90 minutes Frequency: 3 to 5 out of 7 days Duration/Length of Stay: 14-21 days Treatment/Interventions: Cognitive remediation/compensation;Cueing hierarchy;Internal/external aids;Functional tasks;Patient/family education;Environmental controls;Dysphagia/aspiration precaution training   Daily Session  Skilled Therapeutic Interventions: Pt was seen for skilled ST targeting communication goals.  SLP facilitated the session with a verbal reasoning task targeting use of compensatory dysarthria strategies in sentences and conversations.  Task challenge was increased with environmental distractions.  Pt utilized increased vocal intensity and pacing with min assist verbal cues to achieve intelligibility in a noisy environment.  Pt reported a fall yesterday afternoon while "showing off" for her sister who was visiting.  Pt verbalized understanding of safety precautions and reported that she was not going to get up unassisted any more; however, pt required min verbal cues for safety due to impulsivity during toilet transfer with RN. Pt was left in wheelchair with quick release belt donned and call bell left within reach.  Goals updated on this date to reflect current progress and plan of care.        Function:   Eating Eating                 Cognition Comprehension Comprehension assist level: Understands complex 90% of the time/cues 10% of the time  Expression   Expression assist level: Expresses basic 90% of the time/requires cueing < 10% of  the time.  Social Interaction Social Interaction assist level: Interacts appropriately 90% of the time - Needs monitoring or encouragement for participation or interaction.  Problem Solving Problem solving assist level: Solves basic 75 - 89% of the  time/requires cueing 10 - 24% of the time  Memory Memory assist level: Recognizes or recalls 50 - 74% of the time/requires cueing 25 - 49% of the time   General    Pain Pain Assessment Pain Assessment: No/denies pain  Therapy/Group: Individual Therapy  Alexes Menchaca, Selinda Orion 06/29/2015, 3:47 PM

## 2015-06-29 NOTE — Plan of Care (Signed)
Problem: RH Balance Goal: LTG Patient will maintain dynamic standing balance (PT) LTG: Patient will maintain dynamic standing balance with assistance during mobility activities (PT)  Goal downgraded due to lack of progress  Problem: RH Bed to Chair Transfers Goal: LTG Patient will perform bed/chair transfers w/assist (PT) LTG: Patient will perform bed/chair transfers with assistance, with/without cues (PT).  Goal downgraded due to lack of progress  Problem: RH Car Transfers Goal: LTG Patient will perform car transfers with assist (PT) LTG: Patient will perform car transfers with assistance (PT).  Goal downgraded due to lack of progress  Problem: RH Furniture Transfers Goal: LTG Patient will perform furniture transfers w/assist (OT/PT LTG: Patient will perform furniture transfers with assistance (OT/PT).  Goal downgraded due to lack of progress

## 2015-06-30 ENCOUNTER — Inpatient Hospital Stay (HOSPITAL_COMMUNITY): Payer: Medicaid Other | Admitting: Physical Therapy

## 2015-06-30 NOTE — Progress Notes (Signed)
Patient ID: Rebecca Yoder, female   DOB: 09-19-1965, 49 y.o.   MRN: [id number]   Subjective/Complaints: Patient slept well overnight. She is pleased with the ongoing progress.  ROS:  - CP, -SOB, - N/V/D  Objective: Vital Signs: Blood pressure 109/58, pulse 92, temperature 98.4 F (36.9 C), temperature source Oral, resp. rate 16, height 5' 4.5" (1.638 m), weight 82.555 kg (182 lb), last menstrual period 06/12/2015, SpO2 94 %. No results found. No results found for this or any previous visit (from the past 72 hour(s)).   HEENT: Normocephalic, atraumatic Cardio: RRR Resp: CTA B/L and unlabored GI: BS positive and NT, ND Skin:   Intact, warm and dry Neuro: Alert/Oriented, + dysarthria  Motor LUE 3 Left delt bi tri grip, 3+ L HF, 2- L KE,  1 R ankle,  Musc/Skel:  Other no pain with UE or LE ROM,No Edema, no tenderness   Assessment/Plan: 1. Functional deficits secondary to RIght MCA infarct which require 3+ hours per day of interdisciplinary therapy in a comprehensive inpatient rehab setting. Physiatrist is providing close team supervision and 24 hour management of active medical problems listed below. Physiatrist and rehab team continue to assess barriers to discharge/monitor patient progress toward functional and medical goals.   FIM: Function - Bathing Position: Shower Body parts bathed by patient: Right arm, Left arm, Chest, Abdomen, Front perineal area, Right upper leg, Left upper leg, Right lower leg, Left lower leg, Back, Buttocks Body parts bathed by helper: Buttocks Assist Level: Set up, Supervision or verbal cues Set up : To obtain items  Function- Upper Body Dressing/Undressing What is the patient wearing?: Pull over shirt/dress Bra - Perfomed by patient: Thread/unthread right bra strap, Thread/unthread left bra strap Bra - Perfomed by helper: Hook/unhook bra (pull down sports bra) Pull over shirt/dress - Perfomed by patient: Thread/unthread right sleeve, Put head  through opening, Pull shirt over trunk Pull over shirt/dress - Perfomed by helper: Thread/unthread left sleeve Assist Level: Touching or steadying assistance(Pt > 75%) Set up : To obtain clothing/put away Function - Lower Body Dressing/Undressing What is the patient wearing?: Pants, Shoes, AFO Position: Sitting EOB Underwear - Performed by patient: Thread/unthread right underwear leg, Thread/unthread left underwear leg Underwear - Performed by helper: Pull underwear up/down Pants- Performed by patient: Thread/unthread right pants leg, Thread/unthread left pants leg Pants- Performed by helper: Pull pants up/down Non-skid slipper socks- Performed by patient: Don/doff right sock Non-skid slipper socks- Performed by helper: Don/doff right sock, Don/doff left sock Socks - Performed by patient: Don/doff left sock Socks - Performed by helper: Don/doff right sock Shoes - Performed by patient: Don/doff right shoe Shoes - Performed by helper: Don/doff right shoe, Don/doff left shoe, Fasten right, Fasten left AFO - Performed by helper: Don/doff left AFO Assist for footwear: Partial/moderate assist Assist for lower body dressing: Touching or steadying assistance (Pt > 75%)  Function - Toileting Toileting steps completed by patient: Performs perineal hygiene Toileting steps completed by helper: Adjust clothing prior to toileting, Adjust clothing after toileting Toileting Assistive Devices: Grab bar or rail Assist level: Touching or steadying assistance (Pt.75%)  Function - Air cabin crew transfer assistive device: Elevated toilet seat/BSC over toilet, Grab bar, Walker Mechanical lift: Stedy Assist level to toilet: Moderate assist (Pt 50 - 74%/lift or lower) Assist level from toilet: Moderate assist (Pt 50 - 74%/lift or lower) Assist level to bedside commode (at bedside): Maximal assist (Pt 25 - 49%/lift and lower) Assist level from bedside commode (at bedside): Maximal assist (Pt  25 -  49%/lift and lower)  Function - Chair/bed transfer Chair/bed transfer method: Squat pivot Chair/bed transfer assist level: Touching or steadying assistance (Pt > 75%) Chair/bed transfer assistive device: Armrests Chair/bed transfer details: Manual facilitation for weight shifting, Verbal cues for safe use of DME/AE, Verbal cues for technique, Manual facilitation for placement  Function - Locomotion: Wheelchair Will patient use wheelchair at discharge?: Yes Type: Manual Max wheelchair distance: 150' Assist Level: No help, No cues, assistive device, takes more than reasonable amount of time Assist Level: No help, No cues, assistive device, takes more than reasonable amount of time Wheel 150 feet activity did not occur: Safety/medical concerns Assist Level: No help, No cues, assistive device, takes more than reasonable amount of time Turns around,maneuvers to table,bed, and toilet,negotiates 3% grade,maneuvers on rugs and over doorsills: No Function - Locomotion: Ambulation Assistive device: Walker-rolling, Orthosis, Cane-quad Max distance: 75' x 2 reps Assist level: Moderate assist (Pt 50 - 74%) Assist level: Touching or steadying assistance (Pt > 75%) Walk 50 feet with 2 turns activity did not occur: Safety/medical concerns Assist level: Moderate assist (Pt 50 - 74%) Walk 150 feet activity did not occur: Safety/medical concerns Walk 10 feet on uneven surfaces activity did not occur: Safety/medical concerns  Function - Comprehension Comprehension: Auditory Comprehension assist level: Understands complex 90% of the time/cues 10% of the time  Function - Expression Expression: Verbal Expression assist level: Expresses complex 90% of the time/cues < 10% of the time  Function - Social Interaction Social Interaction assist level: Interacts appropriately 90% of the time - Needs monitoring or encouragement for participation or interaction.  Function - Problem Solving Problem solving  assist level: Solves basic 75 - 89% of the time/requires cueing 10 - 24% of the time  Function - Memory Memory assist level: Recognizes or recalls 50 - 74% of the time/requires cueing 25 - 49% of the time Patient normally able to recall (first 3 days only): That he or she is in a hospital, Current season, Location of own room, Staff names and faces    Medical Problem List and Plan: 1. Functional deficits secondary to right MCA infarct with history of prior CVA 2008 as well as 2014, left hemiparesis  -Plavix for CVA prophyllaxis  Discussed focus on return to home                                       2.  DVT Prophylaxis/Anticoagulation: SCDs. Venous Doppler is negative 3. Pain Management: Tylenol as needed, hemiplegic shoulder pain- sportscreme and Kpad, positioning, post CVA headache improving 4. Dysphagia. Improved on regular diet. Monitor for any signs of aspiration. Follow-up speech therapy 5. Neuropsych: This patient is capable of making decisions on her own behalf. 6. Skin/Wound Care: Routine skin checks 7. Fluids/Electrolytes/Nutrition: Routine I and Os with follow-up chemistries, back on regular diet with thin liquids  - Eating 80-100% of meals 8. Hyperlipidemia. Continue Pravachol 9. Hypertension. Cozaar 100 mg daily, HCTZ 12.5 mg daily. Monitor with increased mobility, controlled ,  - BP improved after Norvasc DC'd 10. History of asthma. Patient on albuterol inhaler as needed prior to admission 11.  Anemia- Fe studies ok, 1 neg and 2 pos stool guaic one pos during menstruation, monitor, recheck CBC shows normal Hgb, can f/u GI as outpt 12.  Stroke related gait using AFO  LOS (Days) 16 A FACE TO FACE EVALUATION WAS PERFORMED  Ankit  Lorie Phenix 06/30/2015, 8:41 AM

## 2015-06-30 NOTE — Plan of Care (Signed)
Problem: RH PAIN MANAGEMENT Goal: RH STG PAIN MANAGED AT OR BELOW PT'S PAIN GOAL <3 on a 0-10 pain scale  Outcome: Not Progressing Patient complains of headaches, rate 6-10. Patient unable to sleep at HS. PRN vicodin (1) given at HS.adm

## 2015-06-30 NOTE — Progress Notes (Signed)
Physical Therapy Session Note  Patient Details  Name: Rebecca Yoder MRN: [id number] Date of Birth: April 09, 1966  Today's Date: 06/30/2015 PT Individual Time: 1000-1100 PT Individual Time Calculation (min): 60 min   Short Term Goals: Week 3:  PT Short Term Goal 1 (Week 3): STGs = LTGs due to ELOS  Skilled Therapeutic Interventions/Progress Updates:    Pt received up in w/c, agreeable to PT session. Gait Training - PT places shoes and L AFO on pt's foot, pt ambulates with RW and L hand splint x 75' req min-mod A with LOB - repeated verbal cues to step L leg out and lean to the R, due to pt's repeated LOB to the L from L LE adducting and L trunk lean. W/C Management - PT instructs pt in w/c propulsion with B LEs x 150' req encouragement and supervision. Neuromuscular Reeducation - PT instructs pt in standing L side NMR activities in // bars (without AFO on): mini-squats, heel raises: 2 x 10 reps. Therapeutic Activity - Pt's sister [name] present, and PT initiates squat-pivot transfer training x 6 reps - focus on body placement, hand placement, head-hips relationship. Pt ended up in room in w/c with quick release belt in place, sister present, and all needs in reach. Continue per PT POC.   Therapy Documentation Precautions:  Precautions Precautions: Fall Precaution Comments: left hemiparesis, decr L awareness, impulsive Restrictions Weight Bearing Restrictions: No Pain: Pain Assessment Pain Assessment: No/denies pain   See Function Navigator for Current Functional Status.   Therapy/Group: Individual Therapy  Laith Antonelli M 06/30/2015, 10:25 AM

## 2015-07-01 ENCOUNTER — Inpatient Hospital Stay (HOSPITAL_COMMUNITY): Payer: Medicaid Other | Admitting: Physical Therapy

## 2015-07-01 DIAGNOSIS — R6889 Other general symptoms and signs: Secondary | ICD-10-CM

## 2015-07-01 NOTE — Progress Notes (Addendum)
Physical Therapy Session Note  Patient Details  Name: Rebecca Yoder MRN: [id number] Date of Birth: August 24, 1965  Today's Date: 07/01/2015 PT Individual Time: 1300-1355 PT Individual Time Calculation (min): 55 min   Short Term Goals: Week 1:  PT Short Term Goal 1 (Week 1): Pt will initiate stair training during PT session.  PT Short Term Goal 1 - Progress (Week 1): Met PT Short Term Goal 2 (Week 1): Pt will initiate ambulation with HW during PT session.  PT Short Term Goal 2 - Progress (Week 1): Met PT Short Term Goal 3 (Week 1): Pt will demonstrate sit to stand req min A.  PT Short Term Goal 3 - Progress (Week 1): Partly met PT Short Term Goal 4 (Week 1): Pt will self propel manual w/c x 100' req SBA.  PT Short Term Goal 4 - Progress (Week 1): Met PT Short Term Goal 5 (Week 1): Pt will req mod A for dynamic standing balance activities.  PT Short Term Goal 5 - Progress (Week 1): Partly met  Skilled Therapeutic Interventions/Progress Updates:  Pt was seen bedside in the pm for family training with pt's boyfriend. Pt's boyfriend educated on donning and doffing of L AFO. Pt's boyfriend educated on bed mobility, squat pivot transfers, stand pivot transfers with rolling walker, car transfers, ambulation with rolling walker and stair training. Pt was able to perform bed mobility on mat with S to min A with verbal cues. Pt performed squat pivot transfers with min to mod A and stand pivot transfers with min to mod A with rolling walker and verbal cues. Pt performed car transfers with mod A. Pt ambulated with rolling walker, L hand orthosis, L AFO and mod A with verbal cues. Pt performed stair training with mod A. Pt's boyfriend was able to demonstrate proper mechanics and sequencing with all transfers. Also educated pt's sister [name] on gait training with rolling walker, L hand orthosis and L AFO. Answered all questions following training. Also stressed pt's poor safety awareness and tendency to fall  to the L, pt's boyfriend and sister verbalized understanding.   Therapy Documentation Precautions:  Precautions Precautions: Fall Precaution Comments: left hemiparesis, decr L awareness, impulsive Restrictions Weight Bearing Restrictions: No General:   Pain: No c/o pain.    See Function Navigator for Current Functional Status.   Therapy/Group: Individual Therapy  Dub Amis 07/01/2015, 2:30 PM

## 2015-07-01 NOTE — Progress Notes (Signed)
Patient ID: Rebecca Yoder, female   DOB: Jan 01, 1966, 49 y.o.   MRN: [id number]   Subjective/Complaints: Examined this morning lying in bed with the K pad over her head. When asked if this is comfortable for her, patient states that matter this way.  ROS:  - CP, -SOB, - N/V/D  Objective: Vital Signs: Blood pressure 119/74, pulse 74, temperature 97.8 F (36.6 C), temperature source Oral, resp. rate 17, height 5' 4.5" (1.638 m), weight 82.555 kg (182 lb), last menstrual period 06/12/2015, SpO2 97 %. No results found. No results found for this or any previous visit (from the past 72 hour(s)).   HEENT: Normocephalic, atraumatic Cardio: RRR Resp: CTA B/L and unlabored GI: BS positive and NT, ND Skin:   Intact, warm and dry Neuro: Alert/Oriented, + dysarthria  Motor LUE 3 Left delt bi tri grip, 3+ L HF, 2- L KE,  1 R ankle No increase in tone in left upper extremity.  Musc/Skel:  Other no pain with UE or LE ROM,No Edema, no tenderness   Assessment/Plan: 1. Functional deficits secondary to RIght MCA infarct which require 3+ hours per day of interdisciplinary therapy in a comprehensive inpatient rehab setting. Physiatrist is providing close team supervision and 24 hour management of active medical problems listed below. Physiatrist and rehab team continue to assess barriers to discharge/monitor patient progress toward functional and medical goals.   FIM: Function - Bathing Position: Shower Body parts bathed by patient: Right arm, Left arm, Chest, Abdomen, Front perineal area, Right upper leg, Left upper leg, Right lower leg, Left lower leg, Back, Buttocks Body parts bathed by helper: Buttocks Assist Level: Set up, Supervision or verbal cues Set up : To obtain items  Function- Upper Body Dressing/Undressing What is the patient wearing?: Pull over shirt/dress Bra - Perfomed by patient: Thread/unthread right bra strap, Thread/unthread left bra strap Bra - Perfomed by helper:  Hook/unhook bra (pull down sports bra) Pull over shirt/dress - Perfomed by patient: Thread/unthread right sleeve, Put head through opening, Pull shirt over trunk Pull over shirt/dress - Perfomed by helper: Thread/unthread left sleeve Assist Level: Touching or steadying assistance(Pt > 75%) Set up : To obtain clothing/put away Function - Lower Body Dressing/Undressing What is the patient wearing?: Pants, Shoes, AFO Position: Sitting EOB Underwear - Performed by patient: Thread/unthread right underwear leg, Thread/unthread left underwear leg Underwear - Performed by helper: Pull underwear up/down Pants- Performed by patient: Thread/unthread right pants leg, Thread/unthread left pants leg Pants- Performed by helper: Pull pants up/down Non-skid slipper socks- Performed by patient: Don/doff right sock Non-skid slipper socks- Performed by helper: Don/doff right sock, Don/doff left sock Socks - Performed by patient: Don/doff left sock Socks - Performed by helper: Don/doff right sock Shoes - Performed by patient: Don/doff right shoe Shoes - Performed by helper: Don/doff right shoe, Don/doff left shoe, Fasten right, Fasten left AFO - Performed by helper: Don/doff left AFO Assist for footwear: Partial/moderate assist Assist for lower body dressing: Touching or steadying assistance (Pt > 75%)  Function - Toileting Toileting steps completed by patient: Performs perineal hygiene Toileting steps completed by helper: Adjust clothing prior to toileting, Adjust clothing after toileting Toileting Assistive Devices: Grab bar or rail Assist level: Touching or steadying assistance (Pt.75%)  Function - Air cabin crew transfer assistive device: Elevated toilet seat/BSC over toilet, Grab bar, Walker Mechanical lift: Stedy Assist level to toilet: Moderate assist (Pt 50 - 74%/lift or lower) Assist level from toilet: Moderate assist (Pt 50 - 74%/lift or lower) Assist  level to bedside commode (at  bedside): Maximal assist (Pt 25 - 49%/lift and lower) Assist level from bedside commode (at bedside): Maximal assist (Pt 25 - 49%/lift and lower)  Function - Chair/bed transfer Chair/bed transfer method: Squat pivot Chair/bed transfer assist level: Touching or steadying assistance (Pt > 75%) Chair/bed transfer assistive device: Armrests, Orthosis (L AFO) Chair/bed transfer details: Manual facilitation for placement, Verbal cues for technique, Verbal cues for precautions/safety  Function - Locomotion: Wheelchair Will patient use wheelchair at discharge?: Yes Type: Manual Max wheelchair distance: 150' Assist Level: Supervision or verbal cues (encouragement when using B LEs for propulsion) Assist Level: Supervision or verbal cues Wheel 150 feet activity did not occur: Safety/medical concerns Assist Level: Supervision or verbal cues Turns around,maneuvers to table,bed, and toilet,negotiates 3% grade,maneuvers on rugs and over doorsills: No Function - Locomotion: Ambulation Assistive device: Walker-rolling, Orthosis (L AFO and L hand splint) Max distance: 82' Assist level: Moderate assist (Pt 50 - 74%) Assist level: Moderate assist (Pt 50 - 74%) Walk 50 feet with 2 turns activity did not occur: Safety/medical concerns Assist level: Moderate assist (Pt 50 - 74%) Walk 150 feet activity did not occur: Safety/medical concerns Walk 10 feet on uneven surfaces activity did not occur: Safety/medical concerns  Function - Comprehension Comprehension: Auditory Comprehension assist level: Understands complex 90% of the time/cues 10% of the time  Function - Expression Expression: Verbal Expression assist level: Expresses basic 75 - 89% of the time/requires cueing 10 - 24% of the time. Needs helper to occlude trach/needs to repeat words.  Function - Social Interaction Social Interaction assist level: Interacts appropriately with others with medication or extra time (anti-anxiety,  antidepressant).  Function - Problem Solving Problem solving assist level: Solves basic 75 - 89% of the time/requires cueing 10 - 24% of the time  Function - Memory Memory assist level: Recognizes or recalls 50 - 74% of the time/requires cueing 25 - 49% of the time Patient normally able to recall (first 3 days only): That he or she is in a hospital, Current season, Location of own room, Staff names and faces    Medical Problem List and Plan: 1. Functional deficits secondary to right MCA infarct with history of prior CVA 2008 as well as 2014, left hemiparesis  -Plavix for CVA prophyllaxis  Discussed focus on return to home                                       2.  DVT Prophylaxis/Anticoagulation: SCDs. Venous Doppler is negative 3. Pain Management: Tylenol as needed, hemiplegic shoulder pain- sportscreme and Kpad, positioning, post CVA headache improving 4. Dysphagia. Improved on regular diet. Monitor for any signs of aspiration. Follow-up speech therapy 5. Neuropsych: This patient is capable of making decisions on her own behalf. 6. Skin/Wound Care: Routine skin checks 7. Fluids/Electrolytes/Nutrition: Routine I and Os with follow-up chemistries, back on regular diet with thin liquids  - Eating 50-100% of meals 8. Hyperlipidemia. Continue Pravachol 9. Hypertension. Cozaar 100 mg daily, HCTZ 12.5 mg daily. Monitor with increased mobility, controlled ,  - BP improved after Norvasc DC'd  - 119/74 this a.m. 10. History of asthma. Patient on albuterol inhaler as needed prior to admission 11.  Anemia- Fe studies ok, 1 neg and 2 pos stool guaic one pos during menstruation, monitor, recheck CBC shows normal Hgb, can f/u GI as outpt  - 12.0 on 11/13 12.  Stroke related gait using AFO  LOS (Days) 17 A FACE TO FACE EVALUATION WAS PERFORMED  Ankit Lorie Phenix 07/01/2015, 8:04 AM

## 2015-07-02 ENCOUNTER — Inpatient Hospital Stay (HOSPITAL_COMMUNITY): Payer: Medicaid Other

## 2015-07-02 ENCOUNTER — Inpatient Hospital Stay (HOSPITAL_COMMUNITY): Payer: Medicaid Other | Admitting: Speech Pathology

## 2015-07-02 NOTE — Progress Notes (Signed)
Speech Language Pathology Daily Session Note  Patient Details  Name: Rebecca Yoder MRN: [id number] Date of Birth: 1966-04-28  Today's Date: 07/02/2015 SLP Individual Time: 0800-0900 SLP Individual Time Calculation (min): 60 min  Short Term Goals: Week 3: SLP Short Term Goal 1 (Week 3): STG=LTG due to remaining length of stay   Skilled Therapeutic Interventions:  Pt was seen for skilled ST targeting communication goals.  SLP facilitated the session with a generative naming task targeting use of compensatory dysarthria strategies.  Pt utilized overarticulation, pacing, and increased vocal intensity during the abovementioned task with supervision cues to achieve intelligibility at the word and sentence level.  Task challenge was increased with environmental distractions (door to treatment room left open, ambient noise) to further facilitate carryover of strategies across all contexts.  Pt was returned to room and left with quick release belt donned and call bell left within reach.  Continue per current plan of care.    Function:  Eating Eating   Modified Consistency Diet: No Eating Assist Level: Set up assist for   Eating Set Up Assist For: Opening containers       Cognition Comprehension Comprehension assist level: Understands complex 90% of the time/cues 10% of the time  Expression   Expression assist level: Expresses basic 90% of the time/requires cueing < 10% of the time.  Social Interaction Social Interaction assist level: Interacts appropriately with others with medication or extra time (anti-anxiety, antidepressant).  Problem Solving Problem solving assist level: Solves basic 75 - 89% of the time/requires cueing 10 - 24% of the time  Memory Memory assist level: Recognizes or recalls 50 - 74% of the time/requires cueing 25 - 49% of the time    Pain Pain Assessment Pain Assessment: Faces Faces Pain Scale: Hurts a little bit Pain Type: Acute pain Pain Location: Head Pain  Descriptors / Indicators: Headache Pain Intervention(s): RN made aware   Therapy/Group: Individual Therapy  Aidynn Polendo, Selinda Orion 07/02/2015, 12:13 PM

## 2015-07-02 NOTE — Progress Notes (Signed)
Physical Therapy Session Note  Patient Details  Name: KEYSHIA KEARN MRN: [id number] Date of Birth: [birth year]  Today's Date: 07/02/2015 PT Individual Time: 0935-1005 PT Individual Time Calculation (min): 30 min   Short Term Goals: Week 3:  PT Short Term Goal 1 (Week 3): STGs = LTGs due to ELOS  Skilled Therapeutic Interventions/Progress Updates:    Patient seen in w/c propels with supervision and increased time to and from therapy gym.  She performed sit to stand minguard A cues for weight shift.  Ambulated with RW w/ L UE splint and min/mod A LOB to L x 3 mod cues for ant and R weight shift esp when unable to advance L LE.  Patient able to increase distance to 37' with two turns min/mod A.  Patient negotiated 4 stairs with one rail and min/mod A.  Propelled back to room and left with all needs in reach.   Therapy Documentation Precautions:  Precautions Precautions: Fall Precaution Comments: left hemiparesis, decr L awareness, impulsive Restrictions Weight Bearing Restrictions: No Pain: Pain Assessment Pain Assessment: No/denies pain    See Function Navigator for Current Functional Status.   Therapy/Group: Individual Therapy  Thomson, [place] 07/02/2015  07/02/2015, 12:54 PM

## 2015-07-02 NOTE — Progress Notes (Signed)
Patient ID: Rebecca Yoder, female   DOB: 08/28/1965, 49 y.o.   MRN: [id number]   Subjective/Complaints: Left ankle pain, started when in bed, noticed when she got up to go to bathroom, using left AFO for ambulation No ankle swelling no hx of trauma to that area ROS:  - CP, -SOB, - N/V/D  Objective: Vital Signs: Blood pressure 119/66, pulse 84, temperature 98.1 F (36.7 C), temperature source Oral, resp. rate 17, height 5' 4.5" (1.638 m), weight 82.555 kg (182 lb), last menstrual period 06/12/2015, SpO2 96 %. No results found. No results found for this or any previous visit (from the past 72 hour(s)).   HEENT: Normocephalic, atraumatic Cardio: RRR Resp: CTA B/L and unlabored GI: BS positive and NT, ND Skin:   Intact, warm and dry Neuro: Alert/Oriented, + dysarthria  Motor LUE 3 Left delt bi tri grip, 3+ L HF, 2- L KE,  1 R ankle No increase in tone in left upper extremity.  Musc/Skel:  Other no pain with UE or LE ROM,No Edema, no tenderness at left ankle, mild pain with passive inversion   Assessment/Plan: 1. Functional deficits secondary to RIght MCA infarct which require 3+ hours per day of interdisciplinary therapy in a comprehensive inpatient rehab setting. Physiatrist is providing close team supervision and 24 hour management of active medical problems listed below. Physiatrist and rehab team continue to assess barriers to discharge/monitor patient progress toward functional and medical goals.   FIM: Function - Bathing Position: Shower Body parts bathed by patient: Right arm, Left arm, Chest, Abdomen, Front perineal area, Right upper leg, Left upper leg, Right lower leg, Left lower leg, Back, Buttocks Body parts bathed by helper: Buttocks Assist Level: Set up, Supervision or verbal cues Set up : To obtain items  Function- Upper Body Dressing/Undressing What is the patient wearing?: Pull over shirt/dress Bra - Perfomed by patient: Thread/unthread right bra strap,  Thread/unthread left bra strap Bra - Perfomed by helper: Hook/unhook bra (pull down sports bra) Pull over shirt/dress - Perfomed by patient: Thread/unthread right sleeve, Put head through opening, Pull shirt over trunk Pull over shirt/dress - Perfomed by helper: Thread/unthread left sleeve Assist Level: Touching or steadying assistance(Pt > 75%) Set up : To obtain clothing/put away Function - Lower Body Dressing/Undressing What is the patient wearing?: Pants, Shoes, AFO Position: Sitting EOB Underwear - Performed by patient: Thread/unthread right underwear leg, Thread/unthread left underwear leg Underwear - Performed by helper: Pull underwear up/down Pants- Performed by patient: Thread/unthread right pants leg, Thread/unthread left pants leg Pants- Performed by helper: Pull pants up/down Non-skid slipper socks- Performed by patient: Don/doff right sock Non-skid slipper socks- Performed by helper: Don/doff right sock, Don/doff left sock Socks - Performed by patient: Don/doff left sock Socks - Performed by helper: Don/doff right sock Shoes - Performed by patient: Don/doff right shoe Shoes - Performed by helper: Don/doff right shoe, Don/doff left shoe, Fasten right, Fasten left AFO - Performed by helper: Don/doff left AFO Assist for footwear: Partial/moderate assist Assist for lower body dressing: Touching or steadying assistance (Pt > 75%)  Function - Toileting Toileting steps completed by patient: Performs perineal hygiene Toileting steps completed by helper: Adjust clothing prior to toileting, Adjust clothing after toileting Toileting Assistive Devices: Grab bar or rail Assist level: Touching or steadying assistance (Pt.75%)  Function - Air cabin crew transfer assistive device: Elevated toilet seat/BSC over toilet, Grab bar, Walker Mechanical lift: Stedy Assist level to toilet: Moderate assist (Pt 50 - 74%/lift or lower) Assist level  from toilet: Moderate assist (Pt 50 -  74%/lift or lower) Assist level to bedside commode (at bedside): Maximal assist (Pt 25 - 49%/lift and lower) Assist level from bedside commode (at bedside): Maximal assist (Pt 25 - 49%/lift and lower)  Function - Chair/bed transfer Chair/bed transfer method: Squat pivot, Stand pivot Chair/bed transfer assist level: Touching or steadying assistance (Pt > 75%) Chair/bed transfer assistive device: Armrests, Walker, Orthosis Chair/bed transfer details: Manual facilitation for placement, Verbal cues for technique, Verbal cues for precautions/safety  Function - Locomotion: Wheelchair Will patient use wheelchair at discharge?: Yes Type: Manual Max wheelchair distance: 150 Assist Level: Supervision or verbal cues Assist Level: Supervision or verbal cues Wheel 150 feet activity did not occur: Safety/medical concerns Assist Level: Supervision or verbal cues Turns around,maneuvers to table,bed, and toilet,negotiates 3% grade,maneuvers on rugs and over doorsills: No Function - Locomotion: Ambulation Assistive device: Walker-rolling, Orthosis Max distance: 40 Assist level: Moderate assist (Pt 50 - 74%) Assist level: Moderate assist (Pt 50 - 74%) Walk 50 feet with 2 turns activity did not occur: Safety/medical concerns Assist level: Moderate assist (Pt 50 - 74%) Walk 150 feet activity did not occur: Safety/medical concerns Walk 10 feet on uneven surfaces activity did not occur: Safety/medical concerns  Function - Comprehension Comprehension: Auditory Comprehension assist level: Understands complex 90% of the time/cues 10% of the time  Function - Expression Expression: Verbal Expression assist level: Expresses basic 75 - 89% of the time/requires cueing 10 - 24% of the time. Needs helper to occlude trach/needs to repeat words.  Function - Social Interaction Social Interaction assist level: Interacts appropriately with others with medication or extra time (anti-anxiety,  antidepressant).  Function - Problem Solving Problem solving assist level: Solves basic 75 - 89% of the time/requires cueing 10 - 24% of the time  Function - Memory Memory assist level: Recognizes or recalls 50 - 74% of the time/requires cueing 25 - 49% of the time Patient normally able to recall (first 3 days only): That he or she is in a hospital, Current season, Location of own room, Staff names and faces    Medical Problem List and Plan: 1. Functional deficits secondary to right MCA infarct with history of prior CVA 2008 as well as 2014, left hemiparesis, Mod A toilet transfers will need family training for D/C -Plavix for CVA prophyllaxis                             2.  DVT Prophylaxis/Anticoagulation: SCDs. Venous Doppler is negative 3. Pain Management: Tylenol as needed, Left lateral ankle pain, likely due to pressure given it started at night, no hx of trauma during therapy, exam unremarkable 4. Dysphagia. Improved on regular diet. Monitor for any signs of aspiration. Follow-up speech therapy 5. Neuropsych: This patient is capable of making decisions on her own behalf. 6. Skin/Wound Care: Routine skin checks 7. Fluids/Electrolytes/Nutrition: Routine I and Os with follow-up chemistries, back on regular diet with thin liquids  - Eating 25-100% of meals 8. Hyperlipidemia. Continue Pravachol 9. Hypertension. Cozaar 100 mg daily, HCTZ 12.5 mg daily. Monitor with increased mobility, controlled ,  - BP improved after Norvasc DC'd, should go home on current regimen  - 119/66 this a.m. 10. History of asthma. Patient on albuterol inhaler as needed prior to admission 11.  Anemia- Fe studies ok, 1 neg and 2 pos stool guaic one pos during menstruation, monitor, recheck CBC shows normal Hgb, can f/u GI as outpt  -  12.0 on 11/13 12.  Stroke related gait using AFO  LOS (Days) 18 A FACE TO FACE EVALUATION WAS PERFORMED  Conny Situ E 07/02/2015, 6:28 AM

## 2015-07-02 NOTE — Progress Notes (Signed)
Occupational Therapy Session Note  Patient Details  Name: Rebecca Yoder MRN: [id number] Date of Birth: [birth year]  Today's Date: 07/02/2015 OT Individual Time: 1115-1200 OT Individual Time Calculation (min): 45 min    Short Term Goals: Week 3:  OT Short Term Goal 1 (Week 3): STG=LTG d/t short remaining LOS.  Skilled Therapeutic Interventions/Progress Updates: ADL-retraining at shower level with focus on discharge planning, improved transfers, and safety awareness, AE use and adapted dressing skills.   Pt received seated in w/c awaiting therapist.   Pt is escorted to shower stall where she doffs clothing with min assist to expedite process d/t time limitations.   Pt completes transfer with min assist to bench and showers seated, using lateral leans to wash buttocks and LH sponge to bathe back, feet, and under left arm.   Pt returns to w/c after bathing and transfers back to EOB to dress with min assist to perform lateral scoot.   Pt requires overall min assist to dress upper body and mod assist for lower although she is now able to reach to her feet to lace underwear and don socks independently with extra time provided.   Pt requires steadying assist while standing, mod assist to stand from sitting position d/t impaired balance. Pt is re-educated on use of DME and AE during session and accepts recommendation to use tub bench for showering rather than shower chair.  SW informed on change in plan.         Therapy Documentation Precautions:  Precautions Precautions: Fall Precaution Comments: left hemiparesis, decr L awareness, impulsive Restrictions Weight Bearing Restrictions: No  Pain: Pain Assessment Pain Assessment: Faces Pain Score: 3  Faces Pain Scale: Hurts a little bit Pain Location: Head Pain Descriptors / Indicators: Headache Pain Intervention(s): RN made aware   ADL: ADL ADL Comments: refer to functional navigator  See Function Navigator for Current Functional  Status.   Therapy/Group: Individual Therapy   Second session: Time: [id number] Time Calculation (min):65 min  Pain Assessment: No/denies pain  Skilled Therapeutic Interventions: Pt received in bed asleep after lunch.   Pt requests assist with toileting prior to planned activity.   Pt requires overall min assist to transfer and mod assist to manage clothing.   Therapeutic activity with focus on performance of assisted iADL (homemaking: bake a cake).   With setup assist to provide supplies and vc with hand-over-hand guidance to use LUE as gross assist, pt completes task of mixing ingredients presented to make a cake.   Pt requires overall min assist to prevent spills, maintain organized separate work area while seated in w/c and intermittent vc to problem-solve.   OT places and recovers cake from d/t risk of burn injury.     See FIM for current functional status  Therapy/Group: Individual Therapy  Mitra Duling 07/02/2015, 12:30 PM

## 2015-07-03 ENCOUNTER — Ambulatory Visit (HOSPITAL_COMMUNITY): Payer: Self-pay | Admitting: Physical Therapy

## 2015-07-03 ENCOUNTER — Inpatient Hospital Stay (HOSPITAL_COMMUNITY): Payer: Medicaid Other | Admitting: Speech Pathology

## 2015-07-03 ENCOUNTER — Inpatient Hospital Stay (HOSPITAL_COMMUNITY): Payer: Medicaid Other

## 2015-07-03 ENCOUNTER — Inpatient Hospital Stay (HOSPITAL_COMMUNITY): Payer: Medicaid Other | Admitting: *Deleted

## 2015-07-03 ENCOUNTER — Encounter (HOSPITAL_COMMUNITY): Payer: Self-pay

## 2015-07-03 NOTE — Progress Notes (Signed)
Physical Therapy Discharge Summary  Patient Details  Name: Rebecca Yoder MRN: [id number] Date of Birth: 12/22/1965  Today's Date: 07/03/2015 PT Individual Time: 1430-1540 PT Individual Time Calculation (min): 70 min    Patient has met 10 of 11 long term goals due to improved balance, improved postural control, increased strength, ability to compensate for deficits, functional use of  left lower extremity, improved coordination, impaired attention and improved awareness.  Patient to discharge at a wheelchair level Supervision for w/c mobility and min A overall for basic transfers, gait short distances and stair negotiation for home entry/exit.   Patient's care partner is independent to provide the necessary physical assistance at discharge.  Reasons goals not met: Stair goal pt requires mod A to perform safely for weight shifting and attention to L foot placement  Recommendation:  Patient will benefit from ongoing skilled PT services in home health setting to continue to advance safe functional mobility, address ongoing impairments in L sided weakness, LUE and LE tone, coordination/motor planning, impaired postural and trunk control, balance, gait, cognition, and minimize fall risk.  Equipment: RW with hand splint and manual w/c.  Reasons for discharge: treatment goals met and discharge from hospital  Patient/family agrees with progress made and goals achieved: Yes  PT Discharge Pt received in w/c with family present for continued family education.  Discussed home set up and equipment needs; set up room to simulate bathroom to assess pt and family ability to safely ambulate into/out of bathroom if w/c does not fit.  Demonstrated sequence to family member and had pt and family member return demonstrate ambulation and pivot transfers with cues for placement of family member's body, hand placement and level of assistance.  Pt able to perform with min A.  Rest of session with focus on  education and training for w/c <> car transfer with pt performing pivot to car with RW and min A, supervision to place LE into and out of car, min A to stand from car and stand pivot to car without AD but UE support on arm rest and family member providing min A and verbal cues for safe pivoting sequence.  Continued education on stair negotiation, gait and bed mobility as documented below.  Also educated family member on wearing schedule of AFO and positioning of UE and LE in bed and w/c.  Pt left in bed with all items within reach and family present.  No further questions or concerns.  Precautions/Restrictions Precautions Precautions: Fall Precaution Comments: left hemiparesis, decr L awareness, impulsive Restrictions Weight Bearing Restrictions: No Vital Signs Therapy Vitals Temp: 98.5 F (36.9 C) Temp Source: Oral Pulse Rate: 98 Resp: 17 BP: 132/89 mmHg Patient Position (if appropriate): Sitting Oxygen Therapy SpO2: 99 % O2 Device: Not Delivered Pain Pain Assessment Pain Assessment: No/denies pain  Cognition Overall Cognitive Status: History of cognitive impairments - at baseline Arousal/Alertness: Awake/alert Orientation Level: Oriented to person;Oriented to place;Oriented to situation Attention: Selective Focused Attention: Appears intact Sustained Attention: Appears intact Selective Attention: Appears intact Memory: Impaired Memory Impairment: Decreased recall of new information Awareness: Impaired Awareness Impairment: Anticipatory impairment Problem Solving: Impaired Problem Solving Impairment: Functional complex Executive Function: Self Correcting Self Monitoring: Impaired Self Monitoring Impairment: Functional complex Self Correcting: Impaired Self Correcting Impairment: Functional complex Behaviors: Impulsive Safety/Judgment: Impaired Comments: continues to be impulsive and requires quick release belt for safety  Sensation Sensation Light Touch: Impaired  Detail Light Touch Impaired Details: Impaired LUE;Impaired LLE Stereognosis: Impaired by gross assessment Hot/Cold: Appears Intact Proprioception:  Impaired Detail Proprioception Impaired Details: Impaired LLE;Impaired LUE Additional Comments: requires supervision to manage both left upper and lower extremiities during functional activities. Coordination Gross Motor Movements are Fluid and Coordinated: No Fine Motor Movements are Fluid and Coordinated: No Coordination and Movement Description: L side hemiplegia Finger Nose Finger Test: impaired L side due to hemiplegia Heel Shin Test: impaired L side due to hemiplegia Motor  Motor Motor: Hemiplegia;Abnormal postural alignment and control;Abnormal tone;Ataxia;Motor impersistence  Mobility Bed Mobility Bed Mobility: Supine to Sit;Sit to Supine Rolling Right: With rail;6: Modified independent (Device/Increase time) Rolling Right Details: Verbal cues for technique Supine to Sit: 5: Supervision Sitting - Scoot to Edge of Bed: 6: Modified independent (Device/Increase time) Sit to Supine: 5: Supervision Transfers Transfers: Yes Sit to Stand: 4: Min assist Sit to Stand Details: Manual facilitation for placement;Manual facilitation for weight shifting;Verbal cues for technique Stand to Sit: 4: Min assist Stand to Sit Details (indicate cue type and reason): Manual facilitation for weight shifting;Manual facilitation for placement;Verbal cues for technique Stand Pivot Transfers: 4: Min assist;With armrests Stand Pivot Transfer Details: Manual facilitation for weight shifting;Manual facilitation for weight bearing;Verbal cues for sequencing;Verbal cues for precautions/safety Locomotion  Ambulation Ambulation: Yes Ambulation/Gait Assistance: 4: Min assist Ambulation Distance (Feet): 50 Feet Assistive device: Rolling walker Gait Gait Pattern: Impaired Gait Pattern: Decreased step length - right;Decreased step length - left;Decreased stance  time - left;Decreased stride length;Decreased hip/knee flexion - left;Decreased dorsiflexion - left;Left steppage;Narrow base of support;Poor foot clearance - left;Decreased trunk rotation;Decreased weight shift to right Stairs / Additional Locomotion Stairs: Yes Stairs Assistance: 3: Mod assist Stairs Assistance Details (indicate cue type and reason): Performed stair negotiation with RUE support on rail with step to sequence with min-mod A for LLE stability in stance and weight shifting; at top of stairs requires +2 to stabilize RW while pt performs last step with UE support on RW.   Stair Management Technique: One rail Right;One rail Left;Step to pattern;Forwards;With walker Number of Stairs: 4 Height of Stairs: 6 Wheelchair Mobility Wheelchair Mobility: Yes Wheelchair Assistance: 5: Supervision Wheelchair Propulsion: Right upper extremity;Right lower extremity Wheelchair Parts Management: Independent Distance: 150  Trunk/Postural Assessment  Cervical Assessment Cervical Assessment: Within Functional Limits Thoracic Assessment Thoracic Assessment: Exceptions to [facility] (L trunk elongated, R trunk shortened) Lumbar Assessment Lumbar Assessment: Exceptions to [facility] (R pelvis elevated; decreased rotation) Postural Control Postural Control: Deficits on evaluation (impaired righting reactions with poor weight shift back to R)  Balance Static Sitting Balance Static Sitting - Balance Support: Feet supported Static Sitting - Level of Assistance: 6: Modified independent (Device/Increase time) Dynamic Sitting Balance Dynamic Sitting - Balance Support: Feet supported;During functional activity Dynamic Sitting - Level of Assistance: 5: Stand by assistance Dynamic Sitting - Balance Activities: Reaching across midline;Lateral lean/weight shifting Sitting balance - Comments: Impaired awareness and diminished sensation present risk of LOB when reaching to the floor toward her left side. Static Standing  Balance Static Standing - Balance Support: Left upper extremity supported;Right upper extremity supported Static Standing - Level of Assistance: 4: Min assist Dynamic Standing Balance Dynamic Standing - Balance Support: Right upper extremity supported;Left upper extremity supported Dynamic Standing - Level of Assistance: 4: Min assist Extremity Assessment  RUE Assessment RUE Assessment: Within Functional Limits LUE Assessment LUE Assessment: Exceptions to WFL LUE AROM (degrees) Overall AROM Left Upper Extremity: Deficits LUE Strength LUE Overall Strength: Deficits LUE Tone LUE Tone Comments: Flexor synergies severely limit functional use of LUE during BADL beyond gross assist. RLE Assessment RLE Assessment:  Within Functional Limits LLE Assessment LLE Assessment: Exceptions to [facility] LLE Strength LLE Overall Strength: Deficits LLE Overall Strength Comments: hip flexion 3/5, knee extension 3/5, knee flexion 3/5, ankle DF 0/5   See Function Navigator for Current Functional Status.  Raylene Everts F[place]ette 07/03/2015, 4:02 PM

## 2015-07-03 NOTE — Progress Notes (Signed)
Speech Language Pathology Discharge Summary  Patient Details  Name: Rebecca Yoder MRN: [id number] Date of Birth: Jun 09, 1966  Today's Date: 07/03/2015 SLP Individual Time: 1405-1430 SLP Individual Time Calculation (min): 25 min   Skilled Therapeutic Interventions:  Pt was seen for skilled ST targeting family education prior to discharge.  Pt's sister, [name] was present throughout the duration of today's therapy and remained actively engaged in all training activities.  SLP updated pt's sister regarding pt's current goals and progress in therapy.  SLP also discussed compensatory dysarthria strategies, emphasizing increased vocal intensity, overarticulation, and pacing to achieve intelligibility in conversations.  All questions were answered to pt's and sister's satisfaction at this time.  Pt and sister both in agreement with home health ST follow up to continue to address speech intelligibility.  Pt left in wheelchair with sister present.  Pt is on track for discharge tomorrow.    Patient has met 5 of 5 long term goals.  Patient to discharge at overall Supervision level.  Reasons goals not met: n/a   Clinical Impression/Discharge Summary:  Pt made functional gains while inpatient and is discharging having met 5 out of 5 long term goals.  Pt is utilizing compensatory dysarthria strategies with supervision cues to achieve intelligibility in conversations.  Pt is also currently consuming regular textures and thin liquids with mod I use of swallowing precautions.   Pt is discharging home with 24/7 supervision from family and would benefit from additional ST follow up post discharge to continue to address dysarthria.  Pt and family education is complete at this time.    Care Partner:  Caregiver Able to Provide Assistance: Yes  Type of Caregiver Assistance: Physical;Cognitive  Recommendation:  24 hour supervision/assistance; Home Health SLP   Rationale for SLP Follow Up: Maximize functional  communication   Equipment: none recommended by SLP    Reasons for discharge: Discharged from hospital       Function:  Eating Eating                 Cognition Comprehension Comprehension assist level: Understands complex 90% of the time/cues 10% of the time  Expression   Expression assist level: Expresses complex 90% of the time/cues < 10% of the time  Social Interaction Social Interaction assist level: Interacts appropriately with others with medication or extra time (anti-anxiety, antidepressant).  Problem Solving Problem solving assist level: Solves basic 75 - 89% of the time/requires cueing 10 - 24% of the time  Memory Memory assist level: Recognizes or recalls 75 - 89% of the time/requires cueing 10 - 24% of the time   Emilio Math 07/03/2015, 6:23 PM

## 2015-07-03 NOTE — Progress Notes (Signed)
Occupational Therapy Discharge Summary  Patient Details  Name: Rebecca Yoder MRN: [id number] Date of Birth: 08-Nov-1965   Patient has met 11 of 14 long term goals due to improved balance and ability to compensate for deficits.  Patient to discharge at [facility] level.  Patient's care partner is independent to provide the necessary physical and cognitive assistance at discharge.    Reasons goals not met: Pt is unable to carry-over learned strategies day-to-day to consistently transfer without physical assistance d/t diminished sensation and dynamic balance; impaired awareness, memory and problem-solving limits performance of iADL.   Pt requires supervision and physical assist to maintain safety during functional tasks.  Recommendation:  Patient will benefit from ongoing skilled OT services in home health setting to continue to advance functional skills in the area of BADL.  Equipment: shower chair, BSC  Reasons for discharge: discharge from hospital  Patient/family agrees with progress made and goals achieved: Yes  OT Discharge Precautions/Restrictions  Precautions Precautions: Fall Precaution Comments: left hemiparesis, decr L awareness, impulsive Restrictions Weight Bearing Restrictions: No  Pain Pain Assessment Pain Assessment: No/denies pain  ADL ADL ADL Comments: see Funcitional Tool  Vision/Perception  Vision- History Baseline Vision/History: Wears glasses Wears Glasses: At all times Patient Visual Report: No change from baseline Vision- Assessment Vision Assessment?: No apparent visual deficits   Cognition Overall Cognitive Status: History of cognitive impairments - at baseline Arousal/Alertness: Awake/alert Orientation Level: Oriented to person;Oriented to place;Oriented to situation Attention: Selective Focused Attention: Appears intact Sustained Attention: Appears intact Selective Attention: Appears intact Memory: Impaired Memory Impairment:  Decreased recall of new information Awareness: Impaired Awareness Impairment: Anticipatory impairment Problem Solving: Impaired Problem Solving Impairment: Functional complex Executive Function: Self Correcting Self Monitoring: Impaired Self Monitoring Impairment: Functional complex Self Correcting: Impaired Self Correcting Impairment: Functional complex Behaviors: Impulsive Safety/Judgment: Impaired Comments: continues to be impulsive and requires quick release belt for safety   Sensation Sensation Light Touch: Impaired Detail Light Touch Impaired Details: Impaired LUE;Impaired LLE Stereognosis: Impaired by gross assessment Hot/Cold: Appears Intact Proprioception: Impaired Detail Proprioception Impaired Details: Impaired LLE;Impaired LUE Additional Comments: requires supervision to manage both left upper and lower extremiities during functional activities. Coordination Gross Motor Movements are Fluid and Coordinated: No Fine Motor Movements are Fluid and Coordinated: No Coordination and Movement Description: L side hemiplegia Finger Nose Finger Test: impaired L side due to hemiplegia  Motor  Motor Motor: Hemiplegia;Abnormal postural alignment and control;Abnormal tone  Mobility  Bed Mobility Bed Mobility: Rolling Right;Sitting - Scoot to Edge of Bed;Right Sidelying to Sit;Supine to Sit Rolling Right: With rail;6: Modified independent (Device/Increase time) Rolling Right Details: Verbal cues for technique Supine to Sit: 6: Modified independent (Device/Increase time);With rails;HOB flat Sitting - Scoot to Edge of Bed: 6: Modified independent (Device/Increase time) Sit to Supine: With rail;6: Modified independent (Device/Increase time);HOB flat Transfers Transfers: Sit to Stand;Stand to Sit Sit to Stand: 4: Min assist Sit to Stand Details: Manual facilitation for placement;Manual facilitation for weight shifting;Verbal cues for technique Stand to Sit: 4: Min assist Stand  to Sit Details (indicate cue type and reason): Manual facilitation for weight shifting;Manual facilitation for placement;Verbal cues for technique  Balance Static Sitting Balance Static Sitting - Balance Support: Feet supported Static Sitting - Level of Assistance: 6: Modified independent (Device/Increase time) Dynamic Sitting Balance Dynamic Sitting - Balance Support: Feet supported;During functional activity Dynamic Sitting - Level of Assistance: 5: Stand by assistance Dynamic Sitting - Balance Activities: Reaching across midline;Lateral lean/weight shifting Sitting balance - Comments: Impaired  awareness and diminished sensation present risk of LOB when reaching to the floor toward her left side. Static Standing Balance Static Standing - Balance Support: Bilateral upper extremity supported Static Standing - Level of Assistance: 4: Min assist Dynamic Standing Balance Dynamic Standing - Balance Support: Right upper extremity supported;During functional activity Dynamic Standing - Level of Assistance: 4: Min assist  Extremity/Trunk Assessment RUE Assessment RUE Assessment: Within Functional Limits LUE Assessment LUE Assessment: Exceptions to WFL LUE AROM (degrees) Overall AROM Left Upper Extremity: Deficits LUE Strength LUE Overall Strength: Deficits LUE Tone LUE Tone Comments: Flexor synergies severely limit functional use of LUE during BADL beyond gross assist.   See Function Navigator for Current Functional Status.  [facility] 07/03/2015, 3:05 PM

## 2015-07-03 NOTE — Discharge Summary (Signed)
Discharge summary job # [id number]

## 2015-07-03 NOTE — Progress Notes (Signed)
Patient ID: Rebecca Yoder, female   DOB: 1965-09-14, 49 y.o.   MRN: [id number]   Subjective/Complaints: Left ankle pain, started when in bed, noticed when she got up to go to bathroom, using left AFO for ambulation No ankle swelling no hx of trauma to that area ROS:  - CP, -SOB, - N/V/D  Objective: Vital Signs: Blood pressure 111/70, pulse 87, temperature 98.6 F (37 C), temperature source Oral, resp. rate 17, height 5' 4.5" (1.638 m), weight 82.555 kg (182 lb), last menstrual period 06/12/2015, SpO2 97 %. No results found. No results found for this or any previous visit (from the past 72 hour(s)).   HEENT: Normocephalic, atraumatic Cardio: RRR Resp: CTA B/L and unlabored GI: BS positive and NT, ND Skin:   Intact, warm and dry Neuro: Alert/Oriented, + dysarthria  Motor LUE 3 Left delt bi tri grip, 3+ L HF, 2- L KE,  1 R ankle No increase in tone in left upper extremity.  Musc/Skel:  Other no pain with UE or LE ROM,No Edema, no tenderness at left ankle, mild pain with passive inversion   Assessment/Plan: 1. Functional deficits secondary to RIght MCA infarct which require 3+ hours per day of interdisciplinary therapy in a comprehensive inpatient rehab setting. Physiatrist is providing close team supervision and 24 hour management of active medical problems listed below. Physiatrist and rehab team continue to assess barriers to discharge/monitor patient progress toward functional and medical goals.  Plan D/C in am  FIM: Function - Bathing Position: Shower Body parts bathed by patient: Right arm, Left arm, Chest, Abdomen, Front perineal area, Buttocks, Right upper leg, Left upper leg, Right lower leg, Left lower leg, Back Body parts bathed by helper: Buttocks Assist Level: Set up Set up : To obtain items  Function- Upper Body Dressing/Undressing What is the patient wearing?: Bra, Pull over shirt/dress Bra - Perfomed by patient: Thread/unthread right bra strap, Thread/unthread  left bra strap Bra - Perfomed by helper: Hook/unhook bra (pull down sports bra) Pull over shirt/dress - Perfomed by patient: Thread/unthread right sleeve, Put head through opening Pull over shirt/dress - Perfomed by helper: Thread/unthread left sleeve, Pull shirt over trunk Assist Level: Touching or steadying assistance(Pt > 75%) Set up : To obtain clothing/put away Function - Lower Body Dressing/Undressing What is the patient wearing?: Underwear, Pants, Socks Position: Sitting EOB Underwear - Performed by patient: Thread/unthread right underwear leg, Thread/unthread left underwear leg Underwear - Performed by helper: Pull underwear up/down Pants- Performed by patient: Thread/unthread right pants leg Pants- Performed by helper: Thread/unthread left pants leg, Pull pants up/down, Fasten/unfasten pants Non-skid slipper socks- Performed by patient: Don/doff right sock Non-skid slipper socks- Performed by helper: Don/doff right sock, Don/doff left sock Socks - Performed by patient: Don/doff right sock, Don/doff left sock Socks - Performed by helper: Don/doff right sock Shoes - Performed by patient: Don/doff right shoe Shoes - Performed by helper: Fasten left AFO - Performed by helper: Don/doff left AFO Assist for footwear: Partial/moderate assist Assist for lower body dressing: Touching or steadying assistance (Pt > 75%)  Function - Toileting Toileting steps completed by patient: Adjust clothing prior to toileting, Performs perineal hygiene Toileting steps completed by helper: Adjust clothing prior to toileting Toileting Assistive Devices: Grab bar or rail Assist level: Touching or steadying assistance (Pt.75%)  Function - Air cabin crew transfer assistive device: Elevated toilet seat/BSC over toilet Mechanical lift: Stedy Assist level to toilet: Touching or steadying assistance (Pt > 75%) Assist level from toilet: Touching or steadying  assistance (Pt > 75%) Assist level to  bedside commode (at bedside): Maximal assist (Pt 25 - 49%/lift and lower) Assist level from bedside commode (at bedside): Maximal assist (Pt 25 - 49%/lift and lower)  Function - Chair/bed transfer Chair/bed transfer method: Squat pivot, Stand pivot Chair/bed transfer assist level: Touching or steadying assistance (Pt > 75%) Chair/bed transfer assistive device: Armrests, Walker, Orthosis Chair/bed transfer details: Manual facilitation for placement, Verbal cues for technique, Verbal cues for precautions/safety  Function - Locomotion: Wheelchair Will patient use wheelchair at discharge?: Yes Type: Manual Max wheelchair distance: 150 Assist Level: Supervision or verbal cues Assist Level: Supervision or verbal cues Wheel 150 feet activity did not occur: Safety/medical concerns Assist Level: Supervision or verbal cues Turns around,maneuvers to table,bed, and toilet,negotiates 3% grade,maneuvers on rugs and over doorsills: No Function - Locomotion: Ambulation Assistive device: Walker-rolling, Orthosis Max distance: 90 Assist level: Moderate assist (Pt 50 - 74%) Assist level: Moderate assist (Pt 50 - 74%) Walk 50 feet with 2 turns activity did not occur: Safety/medical concerns Assist level: Moderate assist (Pt 50 - 74%) Walk 150 feet activity did not occur: Safety/medical concerns Walk 10 feet on uneven surfaces activity did not occur: Safety/medical concerns  Function - Comprehension Comprehension: Auditory Comprehension assist level: Understands complex 90% of the time/cues 10% of the time  Function - Expression Expression: Verbal Expression assist level: Expresses basic 90% of the time/requires cueing < 10% of the time.  Function - Social Interaction Social Interaction assist level: Interacts appropriately with others with medication or extra time (anti-anxiety, antidepressant).  Function - Problem Solving Problem solving assist level: Solves basic 75 - 89% of the time/requires  cueing 10 - 24% of the time  Function - Memory Memory assist level: Recognizes or recalls 50 - 74% of the time/requires cueing 25 - 49% of the time Patient normally able to recall (first 3 days only): That he or she is in a hospital, Current season, Location of own room, Staff names and faces    Medical Problem List and Plan: 1. Functional deficits secondary to right MCA infarct with history of prior CVA 2008 as well as 2014, left hemiparesis, Mod A toilet transfers will need family training for D/C -Plavix for CVA prophyllaxis                             2.  DVT Prophylaxis/Anticoagulation: SCDs. Venous Doppler is negative 3. Pain Management: Tylenol as needed, Left lateral ankle pain, likely due to pressure given it started at night, no hx of trauma during therapy, exam unremarkable 4. Dysphagia. Improved on regular diet. Monitor for any signs of aspiration. Follow-up speech therapy 5. Neuropsych: This patient is capable of making decisions on her own behalf. 6. Skin/Wound Care: Routine skin checks 7. Fluids/Electrolytes/Nutrition: Routine I and Os with follow-up chemistries, back on regular diet with thin liquids   8. Hyperlipidemia. Continue Pravachol 9. Hypertension. Cozaar 100 mg daily, HCTZ 12.5 mg daily. Monitor with increased mobility, controlled ,    - 111/70 this a.m. 10. History of asthma. Patient on albuterol inhaler as needed prior to admission 11.  Anemia- Fe studies ok, 1 neg and 2 pos stool guaic one pos during menstruation, monitor, recheck CBC shows normal Hgb, can f/u GI as outpt  - 12.0 on 11/13 12.  Stroke related gait using AFO  LOS (Days) 19 A FACE TO FACE EVALUATION WAS PERFORMED  Rebecca Yoder 07/03/2015, 7:19 AM

## 2015-07-03 NOTE — Discharge Summary (Signed)
Rebecca Yoder, Rebecca Yoder NO.:  [id number]  MEDICAL RECORD NO.:  [id number]  LOCATION:  [id number]                        FACILITY:  [facility]  PHYSICIAN:  Rebecca Yoder, P.A.  DATE OF BIRTH:  29-Oct-1965  DATE OF ADMISSION:  06/14/2015 DATE OF DISCHARGE:  07/04/2015                              DISCHARGE SUMMARY   DISCHARGE DIAGNOSES: 1. Functional deficits secondary to the right MCA infarct with a     history of prior CVA 2008 as well as 2014. 2. SCDs for DVT prophylaxis. 3. Dysphagia. 4. Hyperlipidemia. 5. Hypertension. 6. History of asthma. 7. Anemia.  HISTORY OF PRESENT ILLNESS:  A 49 year old right-handed female with a history of hypertension, previous right brain CVA 2014, remote right frontal insular infarct 2008 with known PFO maintained on aspirin. Lives with her sister and mother who has cancer.  Presented June 11, 2015 with sudden onset of left-sided weakness, slurred speech, right gaze preference.  Cranial CT scan negative.  The patient did receive tPA.  CT of the head and neck showed occluded M1 segment of the right middle cerebral artery that appeared to have progressed since prior MR angiogram.  MRI of the brain, June 13, 2015 showed progressive nonhemorrhagic infarct involving the right basal ganglia.  Remote anterior right MCA territory infarct involving the insular cortex. Echocardiogram with ejection fraction of 70%, no wall motion abnormalities.  Venous Doppler of the lower extremities negative. Neurology consulted, maintained on Plavix therapy for CVA prophylaxis. She was on a dysphagia #2 thin liquid diet.  Physical and occupational therapy ongoing.  The patient was admitted for comprehensive rehab program.  PAST MEDICAL HISTORY:  See discharge diagnoses.  SOCIAL HISTORY:  Lives with sister who can provide assistance. Independent prior to admission.  She was working as a Astronomer. Functional status upon admission to rehab services was  +2 physical assist, sit to supine, as well as supine to sit, mod max assist for activities of daily living.  PHYSICAL EXAMINATION:  VITAL SIGNS:  Blood pressure 145/84, pulse 82, temperature 98, respirations 22. GENERAL:  This was an alert female, in no acute distress.  Right gaze preference.  Dysarthric speech, but intelligible. LUNGS:  Clear to auscultation without wheeze. CARDIAC:  Regular rate and rhythm.  No murmur. ABDOMEN:  Soft, nontender.  Good bowel sounds.  [place] COURSE:  The patient was admitted to inpatient rehab services with therapies initiated on a 3-hour daily basis consisting of physical therapy, occupational therapy, speech therapy, and rehabilitation nursing.  The following issues were addressed during the patient's rehabilitation stay.  Pertaining to [name] right MCA infarct, she remained stable, maintained on Plavix therapy.  She would follow up Neurology Services.  SCDs for DVT prophylaxis.  Venous Doppler studies negative.  Her diet was advanced to a regular consistency.  Blood pressures controlled on Cozaar as well as HCTZ.  She would follow up with her primary MD.  She continued on Pravachol for hyperlipidemia.  The patient received weekly collaborative interdisciplinary team conferences to discuss estimated length of stay, family teaching, any barriers to discharge.  Propels her wheelchair supervision.  Perform sit to stand, minimal guard, ambulating rolling walker with left upper extremity splint,  increased distance to 90 feet, min mod assist, activities of daily living and homemaking, escorts to the shower stall or she can don and doff her clothing.  Completes transfers with minimal assist to bench and shower seated.  Returns to wheelchair after bathing and transfers back to the edge of bed with minimal assistance.  Full family teaching was completed.  Plan discharge to home with ongoing therapies dictated per rehab  services.  DISCHARGE MEDICATIONS:  Included: 1. Plavix 75 mg p.o. daily. 2. Hydrocodone 1 tablet every 6 hours as needed for severe pain. 3. Cozaar 100 mg p.o. daily. 4. Hydrochlorothiazide 12.5 mg p.o. daily. 5. Multivitamin daily. 6. Protonix 40 mg p.o. daily. 7. Pravachol 20 mg p.o. daily. 8. Senokot-S tablets 2 p.o. b.i.d., hold for loose stools.  DIET:  Regular.  FOLLOWUP:  She would follow up with Dr. Alysia Penna at the outpatient rehab service office as directed.  Dr. Bunnie Pion Neurology services 1 month call for appointment.  Dr. Burnice Logan medical management.  Ongoing therapies had been arranged.     Rebecca Yoder, P.A.     DA/MEDQ  D:  07/03/2015  T:  07/03/2015  Job:  [id number]  cc:   [name], M.D. Pramod P. Leonie Man, MD Marletta Lor, MD

## 2015-07-03 NOTE — Progress Notes (Signed)
Recreational Therapy Discharge Summary Patient Details  Name: CATTIE TINEO MRN: [id number] Date of Birth: [birth year] Today's Date: 07/03/2015  Long term goals set: 1  Long term goals met: 1  Comments on progress toward goals: Pt has made good progress toward goal meeting supervision level.  Pt requires verbal cues for problem solving and safety throughout.  Pt is discharging home with family to coordinate 24 hour care/assist.  Reasons for discharge: discharge from hospital Patient/family agrees with progress made and goals achieved: Yes   Session Note:  Pt participated in simple TR tasks seated w/c level with supervision/verbal cues.  Discussed strategies such as creating a schedule for herself when home to stay on task and target with her personal goals.  Also discussed activity modification & adaptation for leisure pursuits.    Aldwin Micalizzi 07/03/2015, 3:59 PM

## 2015-07-03 NOTE — Progress Notes (Signed)
Occupational Therapy Session Note  Patient Details  Name: Rebecca Yoder MRN: [id number] Date of Birth: 03/16/1966  Today's Date: 07/03/2015 OT Individual Time: 1030-1130  OT Individual Time Calculation (min): 60 min   Short Term Goals: Week 3:  OT Short Term Goal 1 (Week 3): STG=LTG d/t short remaining LOS.  Skilled Therapeutic Interventions/Progress Updates: ADL-retraining with focus on improved dressing skills, dynamic standing balance, improved awareness and sequencing.   Pt received in w/c and receptive for bathing/dressing.   With setup to place w/c, pt completes squat pivot transfer using grab bar with steadying assist.  Pt bathes unassisted using lateral leans to wash buttocks.   Pt returns to w/c using similar transfer skills and repeats transfer to bed using bed rail.   Pt dresses upper body this session without physical assist although requiring cues to problem-solve restrictions at LUE.   Pt dons underwear with steadying assist to stand as therapist pulls up underwear. Pt was challenged to attempt lateral leans to don pants which was unsuccessful however pt's performance improved when she repositioned to supine, using bridging to pull up her pants over her right hip.  Pt still required min assist to pull pants up over left hip completely.   Pt dons socks independently and right shoe using shoe button to fasten laces.  Pt requires assist to don AFO but is able to tighten shoe lace with shoe button.    Pt returned to w/c from EOB with steadying assist and remained seated with QRB applied and call light within reach.     Therapy Documentation Precautions:  Precautions Precautions: Fall Precaution Comments: left hemiparesis, decr L awareness, impulsive Restrictions Weight Bearing Restrictions: No   Pain: No/denies pain   ADL: ADL ADL Comments: refer to functional navigator  See Function Navigator for Current Functional Status.   Therapy/Group: Individual Therapy   Second  session: Time: 1300-1400 Time Calculation (min): 60 min  Pain Assessment: No/denies pain  Skilled Therapeutic Interventions: ADL-retraining with focus on family education (sister Rebecca Yoder present).   OT re-educated sister on recommendation to use tub bench for lateral transfers to reduce fall risk however sister reiterates need for shower chair d/t limited space in bathroom.   OT then demonstrates both stand-pivot transfer and lateral scoot with emphasis on awareness of pt's deficits (absent righting reaction and impaired proprioception of LLE).   Sister completes lateral scoot to/from shower chair in shower simulation frame from/to w/c.   OT then educates sister on pt's need for assistance with toileting and lower body dressing.   Sister acknowledges and confirms that pt will be assisted as needed by family members.   Session concluded with explanation of principles of NMR for LUE and recommended activities as HEP.    SLP arrives at end of session to continue family training on pt's cognitive deficits.   See FIM for current functional status  Therapy/Group: Individual Therapy  [place] 07/03/2015, 2:20 PM

## 2015-07-04 MED ORDER — PRAVASTATIN SODIUM 20 MG PO TABS: 20.0000 mg | ORAL_TABLET | Freq: Every day | ORAL | Status: DC

## 2015-07-04 MED ORDER — HYDROCODONE-ACETAMINOPHEN 5-325 MG PO TABS: 1.0000 | ORAL_TABLET | Freq: Four times a day (QID) | ORAL | Status: DC | PRN

## 2015-07-04 MED ORDER — POTASSIUM CHLORIDE CRYS ER 20 MEQ PO TBCR: 20.0000 meq | EXTENDED_RELEASE_TABLET | Freq: Every day | ORAL | Status: DC

## 2015-07-04 MED ORDER — LOSARTAN POTASSIUM-HCTZ 100-12.5 MG PO TABS: 1.0000 | ORAL_TABLET | Freq: Every day | ORAL | Status: DC

## 2015-07-04 MED ORDER — CLOPIDOGREL BISULFATE 75 MG PO TABS: 75.0000 mg | ORAL_TABLET | Freq: Every day | ORAL | Status: DC

## 2015-07-04 MED ORDER — PANTOPRAZOLE SODIUM 40 MG PO TBEC: 40.0000 mg | DELAYED_RELEASE_TABLET | Freq: Every day | ORAL | Status: DC

## 2015-07-04 NOTE — Discharge Instructions (Signed)
Inpatient Rehab Discharge Instructions  EUA LUNT Discharge date and time: No discharge date for patient encounter.   Activities/Precautions/ Functional Status: Activity: activity as tolerated Diet: regular diet Wound Care: none needed Functional status:  ___ No restrictions     ___ Walk up steps independently ___ 24/7 supervision/assistance   ___ Walk up steps with assistance ___ Intermittent supervision/assistance  ___ Bathe/dress independently ___ Walk with walker     ___ Bathe/dress with assistance ___ Walk Independently    ___ Shower independently _x__ Walk with assistance    ___ Shower with assistance ___ No alcohol     ___ Return to work/school ________    COMMUNITY REFERRALS UPON DISCHARGE:    Home Health:   PT     OT     ST                     Agency:  [facility] Phone: [phone]   Medical Equipment/Items Ordered: hospital bed, bedside commode, tub seat, rolling walker and wheelchair                                                     Agency/Supplier:  [facility] @ [phone]     Special Instructions: No driving   My questions have been answered and I understand these instructions. I will adhere to these goals and the provided educational materials after my discharge from the hospital.  Patient/Caregiver Signature _______________________________ Date __________  Clinician Signature _______________________________________ Date __________  Please bring this form and your medication list with you to all your follow-up doctor's appointments.  STROKE/TIA DISCHARGE INSTRUCTIONS SMOKING Cigarette smoking nearly doubles your risk of having a stroke & is the single most alterable risk factor  If you smoke or have smoked in the last 12 months, you are advised to quit smoking for your health.  Most of the excess cardiovascular risk related to smoking disappears within a year of stopping.  Ask you doctor about anti-smoking medications  [place] Quit Line:  1-800-QUIT NOW  Free Smoking Cessation Classes [phone]  CHOLESTEROL Know your levels; limit fat & cholesterol in your diet  Lipid Panel     Component Value Date/Time   CHOL 153 06/12/2015 0559   TRIG 139 06/12/2015 0559   HDL 39* 06/12/2015 0559   CHOLHDL 3.9 06/12/2015 0559   VLDL 28 06/12/2015 0559   LDLCALC 86 06/12/2015 0559      Many patients benefit from treatment even if their cholesterol is at goal.  Goal: Total Cholesterol (CHOL) less than 160  Goal:  Triglycerides (TRIG) less than 150  Goal:  HDL greater than 40  Goal:  LDL (LDLCALC) less than 100   BLOOD PRESSURE American Stroke Association blood pressure target is less that 120/80 mm/Hg  Your discharge blood pressure is:  BP: 120/73 mmHg  Monitor your blood pressure  Limit your salt and alcohol intake  Many individuals will require more than one medication for high blood pressure  DIABETES (A1c is a blood sugar average for last 3 months) Goal HGBA1c is under 7% (HBGA1c is blood sugar average for last 3 months)  Diabetes: No known diagnosis of diabetes    Lab Results  Component Value Date   HGBA1C 6.6* 06/12/2015     Your HGBA1c can be lowered with medications, healthy diet, and exercise.  Check your blood sugar as directed by your physician  Call your physician if you experience unexplained or low blood sugars.  PHYSICAL ACTIVITY/REHABILITATION Goal is 30 minutes at least 4 days per week  Activity: Increase activity slowly, Therapies: Physical Therapy: Home Health Return to work:   Activity decreases your risk of heart attack and stroke and makes your heart stronger.  It helps control your weight and blood pressure; helps you relax and can improve your mood.  Participate in a regular exercise program.  Talk with your doctor about the best form of exercise for you (dancing, walking, swimming, cycling).  DIET/WEIGHT Goal is to maintain a healthy weight  Your discharge diet is: Diet regular Room  service appropriate?: Yes; Fluid consistency:: Thin  liquids Your height is:  Height: 5' 4.5" (163.8 cm) Your current weight is: Weight: 83.1 kg (183 lb 3.2 oz) Your Body Mass Index (BMI) is:  BMI (Calculated): 31  Following the type of diet specifically designed for you will help prevent another stroke.  Your goal weight range is:    Your goal Body Mass Index (BMI) is 19-24.  Healthy food habits can help reduce 3 risk factors for stroke:  High cholesterol, hypertension, and excess weight.  RESOURCES Stroke/Support Group:  Call [phone]   STROKE EDUCATION PROVIDED/REVIEWED AND GIVEN TO PATIENT Stroke warning signs and symptoms How to activate emergency medical system (call 911). Medications prescribed at discharge. Need for follow-up after discharge. Personal risk factors for stroke. Pneumonia vaccine given:  Flu vaccine given:  My questions have been answered, the writing is legible, and I understand these instructions.  I will adhere to these goals & educational materials that have been provided to me after my discharge from the hospital.

## 2015-07-04 NOTE — Progress Notes (Signed)
Patient ID: Rebecca Yoder, female   DOB: 02/16/1966, 49 y.o.   MRN: [id number]   Subjective/Complaints: No issues ovenite, still with mild Left ankle pain   ROS:  - CP, -SOB, - N/V/D  Objective: Vital Signs: Blood pressure 116/74, pulse 85, temperature 98.2 F (36.8 C), temperature source Oral, resp. rate 19, height 5' 4.5" (1.638 m), weight 82.555 kg (182 lb), last menstrual period 06/12/2015, SpO2 98 %. No results found. No results found for this or any previous visit (from the past 72 hour(s)).   HEENT: Normocephalic, atraumatic Cardio: RRR Resp: CTA B/L and unlabored GI: BS positive and NT, ND Skin:   Intact, warm and dry Neuro: Alert/Oriented, + dysarthria  Motor LUE 3 Left delt bi tri grip, 3+ L HF, 2- L KE,  1 R ankle No increase in tone in left upper extremity.  Musc/Skel:  Other no pain with UE or LE ROM,No Edema, no tenderness at left ankle, mild pain with passive inversion   Assessment/Plan: 1. Functional deficits secondary to RIght MCA infarct Stable for D/C today F/u PCP in 1-2 weeks F/u PM&R 3 weeks See D/C summary See D/C instructions FIM: Function - Bathing Position: Shower Body parts bathed by patient: Right arm, Left arm, Chest, Abdomen, Front perineal area, Buttocks, Right upper leg, Left upper leg, Right lower leg, Left lower leg, Back Body parts bathed by helper: Buttocks Assist Level: Supervision or verbal cues Set up : To obtain items  Function- Upper Body Dressing/Undressing What is the patient wearing?: Bra, Pull over shirt/dress Bra - Perfomed by patient: Thread/unthread right bra strap, Thread/unthread left bra strap, Hook/unhook bra (pull down sports bra) Bra - Perfomed by helper: Hook/unhook bra (pull down sports bra) Pull over shirt/dress - Perfomed by patient: Thread/unthread right sleeve, Thread/unthread left sleeve, Pull shirt over trunk, Put head through opening Pull over shirt/dress - Perfomed by helper: Thread/unthread left sleeve, Pull  shirt over trunk Assist Level: Supervision or verbal cues Set up : To obtain clothing/put away Function - Lower Body Dressing/Undressing What is the patient wearing?: Underwear, Pants, Socks, Shoes, AFO Position: Sitting EOB Underwear - Performed by patient: Thread/unthread right underwear leg, Thread/unthread left underwear leg Underwear - Performed by helper: Pull underwear up/down Pants- Performed by patient: Thread/unthread right pants leg, Thread/unthread left pants leg, Pull pants up/down Pants- Performed by helper: Pull pants up/down Non-skid slipper socks- Performed by patient: Don/doff right sock Non-skid slipper socks- Performed by helper: Don/doff right sock, Don/doff left sock Socks - Performed by patient: Don/doff right sock, Don/doff left sock Socks - Performed by helper: Don/doff right sock Shoes - Performed by patient: Don/doff right shoe, Fasten right, Fasten left Shoes - Performed by helper: Don/doff left shoe AFO - Performed by helper: Don/doff left AFO Assist for footwear: Partial/moderate assist Assist for lower body dressing: Touching or steadying assistance (Pt > 75%)  Function - Toileting Toileting steps completed by patient: Adjust clothing prior to toileting, Adjust clothing after toileting Toileting steps completed by helper: Adjust clothing after toileting Toileting Assistive Devices: Grab bar or rail, Other (comment) (stedy) Assist level: Touching or steadying assistance (Pt.75%)  Function - Air cabin crew transfer assistive device: Elevated toilet seat/BSC over toilet, Bedside commode Mechanical lift: Stedy Assist level to toilet: Touching or steadying assistance (Pt > 75%) Assist level from toilet: Touching or steadying assistance (Pt > 75%) Assist level to bedside commode (at bedside): Maximal assist (Pt 25 - 49%/lift and lower) Assist level from bedside commode (at bedside): Maximal assist (Pt  25 - 49%/lift and lower)  Function - Chair/bed  transfer Chair/bed transfer method: Stand pivot, Ambulatory Chair/bed transfer assist level: Touching or steadying assistance (Pt > 75%) Chair/bed transfer assistive device: Walker, Orthosis Chair/bed transfer details: Manual facilitation for weight shifting, Manual facilitation for placement  Function - Locomotion: Wheelchair Will patient use wheelchair at discharge?: Yes Type: Manual Max wheelchair distance: 150 Assist Level: Supervision or verbal cues Assist Level: Supervision or verbal cues Wheel 150 feet activity did not occur: Safety/medical concerns Assist Level: Supervision or verbal cues Turns around,maneuvers to table,bed, and toilet,negotiates 3% grade,maneuvers on rugs and over doorsills: No Function - Locomotion: Ambulation Assistive device: Walker-rolling, Orthosis Max distance: 50 Assist level: Touching or steadying assistance (Pt > 75%) Assist level: Touching or steadying assistance (Pt > 75%) Walk 50 feet with 2 turns activity did not occur: Safety/medical concerns Assist level: Touching or steadying assistance (Pt > 75%) Walk 150 feet activity did not occur: Safety/medical concerns Walk 10 feet on uneven surfaces activity did not occur: Safety/medical concerns  Function - Comprehension Comprehension: Auditory Comprehension assist level: Understands complex 90% of the time/cues 10% of the time  Function - Expression Expression: Verbal Expression assist level: Expresses complex 90% of the time/cues < 10% of the time  Function - Social Interaction Social Interaction assist level: Interacts appropriately with others with medication or extra time (anti-anxiety, antidepressant).  Function - Problem Solving Problem solving assist level: Solves basic 75 - 89% of the time/requires cueing 10 - 24% of the time  Function - Memory Memory assist level: Recognizes or recalls 75 - 89% of the time/requires cueing 10 - 24% of the time Patient normally able to recall (first 3  days only): That he or she is in a hospital, Current season, Location of own room, Staff names and faces    Medical Problem List and Plan: 1. Functional deficits secondary to right MCA infarct with history of prior CVA 2008 as well as 2014, left hemiparesis, Mod A toilet transfers will need family training for D/C -Plavix for CVA prophyllaxis                             2.  DVT Prophylaxis/Anticoagulation: SCDs. Venous Doppler is negative 3. Pain Management: Tylenol as needed, Left lateral ankle pain, likely due to pressure given it started at night, no hx of trauma during therapy, exam unremarkable 4. Dysphagia. Improved on regular diet. Monitor for any signs of aspiration. Follow-up speech therapy 5. Neuropsych: This patient is capable of making decisions on her own behalf. 6. Skin/Wound Care: Routine skin checks 7. Fluids/Electrolytes/Nutrition: Routine I and Os with follow-up chemistries, back on regular diet with thin liquids   8. Hyperlipidemia. Continue Pravachol 9. Hypertension. Cozaar 100 mg daily, HCTZ 12.5 mg daily. Monitor with increased mobility, controlled ,     10. History of asthma. Patient on albuterol inhaler as needed prior to admission 11.  Anemia- Fe studies ok, 1 neg and 2 pos stool guaic one pos during menstruation, monitor, recheck CBC shows normal Hgb, can f/u GI as outpt  12.  Stroke related gait using AFO  LOS (Days) 20 A FACE TO FACE EVALUATION WAS PERFORMED  Jishnu Jenniges E 07/04/2015, 7:33 AM

## 2015-07-04 NOTE — Progress Notes (Addendum)
Patient A/O, no noted distress. Patient experience headache during shift, administered prn pain regimen (see EMar); effective. Tolerated all meds. Left side weakness. Family took some things home last night. Staff will continue to monitor and meet needs.

## 2015-07-04 NOTE — Progress Notes (Signed)
Social Work  Discharge Note  The overall goal for the admission was met for:   Discharge location: Yes - home with sister and family providing 24/7 assistance  Length of Stay: Yes - 20 days  Discharge activity level: Yes - supervision/ min assist overall  Home/community participation: Yes  Services provided included: MD, RD, PT, OT, SLP, RN, TR, Pharmacy, Neuropsych and SW  Financial Services: Medicaid and SSD applications begun here - pending  Follow-up services arranged: Home Health: PT, OT, ST via [facility], DME: 18x16 lightweight w/c, cushion, rolling walker, 3n1 commode, hospital bed, tub seat - [facility] and Patient/Family has no preference for HH/DME agencies  Comments (or additional information):  Referred to [facility] program for medication assistance;  Also, referred to [facility] for new primary care and ongoing med assist  Patient/Family verbalized understanding of follow-up arrangements: Yes  Individual responsible for coordination of the follow-up plan: pt  Confirmed correct DME delivered: Loleta Frommelt 07/04/2015    Zarea Diesing

## 2015-07-04 NOTE — Progress Notes (Signed)
Pt. Got. D/c instructions.Pt. Is  Ready to go home with family.

## 2015-07-11 ENCOUNTER — Telehealth: Payer: Self-pay | Admitting: Physical Medicine & Rehabilitation

## 2015-07-11 ENCOUNTER — Ambulatory Visit: Payer: Medicaid Other | Attending: Physician Assistant | Admitting: Physician Assistant

## 2015-07-11 VITALS — BP 127/83 | HR 90 | Temp 97.7°F | Resp 16 | Ht 65.0 in | Wt 177.0 lb

## 2015-07-11 DIAGNOSIS — I10 Essential (primary) hypertension: Secondary | ICD-10-CM | POA: Diagnosis not present

## 2015-07-11 DIAGNOSIS — J45909 Unspecified asthma, uncomplicated: Secondary | ICD-10-CM | POA: Insufficient documentation

## 2015-07-11 DIAGNOSIS — Z7902 Long term (current) use of antithrombotics/antiplatelets: Secondary | ICD-10-CM | POA: Insufficient documentation

## 2015-07-11 DIAGNOSIS — Z79899 Other long term (current) drug therapy: Secondary | ICD-10-CM | POA: Diagnosis not present

## 2015-07-11 DIAGNOSIS — I739 Peripheral vascular disease, unspecified: Secondary | ICD-10-CM | POA: Diagnosis not present

## 2015-07-11 DIAGNOSIS — I63419 Cerebral infarction due to embolism of unspecified middle cerebral artery: Secondary | ICD-10-CM

## 2015-07-11 DIAGNOSIS — Z8673 Personal history of transient ischemic attack (TIA), and cerebral infarction without residual deficits: Secondary | ICD-10-CM | POA: Insufficient documentation

## 2015-07-11 MED ORDER — ALBUTEROL SULFATE HFA 108 (90 BASE) MCG/ACT IN AERS: 2.0000 | INHALATION_SPRAY | Freq: Four times a day (QID) | RESPIRATORY_TRACT | Status: DC | PRN

## 2015-07-11 NOTE — Progress Notes (Signed)
Pt's here for hospital f/up for stroke. Pt denies pain at this time.  Pt needs refill of albuterol.

## 2015-07-11 NOTE — Telephone Encounter (Signed)
Called and gave verbal order. Left message or secure voicemail

## 2015-07-11 NOTE — Progress Notes (Signed)
Rebecca Yoder  [id number]  [id number]  DOB - 12-Apr-1966  Chief Complaint  Patient presents with  . Hospitalization Follow-up       Subjective:   Rebecca Yoder is a 49 y.o. female here today for establishment of care. She suffered a right MCA infarct on 06/11/2015. She also had previous strokes in 2008 and 2014. She presented to the emergency department with left-sided weakness and right gaze deviation. She received TPA. She refuses interventional radiology therapies. Most of her workup was pretty benign. She was sent for inpatient rehabilitation to continue speech, occupational, and physical therapy. She is now receiving these therapies at home. She was discharged from inpatient rehabilitation on 07/04/2015. She was sent home on Plavix.   Since discharge she has been compliant with her medications. She is receptive to the ongoing therapies at home. She has help at home from her sister and brother-in-law. She denies chest pain. She denies shortness of breath. She denies syncope. She is still weak on the left side. She's eating more. She denies dizziness or headaches.    ROS: GEN: denies fever or chills, denies change in weight HEENT: denies headache, earache, epistaxis, sore throat, or neck pain LUNGS: denies SHOB, dyspnea, PND, orthopnea CV: denies CP or palpitations ABD: denies abd pain, N or V EXT: denies muscle spasms or swelling; no pain in lower ext, no weakness NEURO: denies numbness or tingling, denies sz, +stroke or TIA  ALLERGIES: Allergies  Allergen Reactions  . Penicillins Rash    PAST MEDICAL HISTORY: Past Medical History  Diagnosis Date  . ASTHMA 12/10/2009  . CEREBROVASCULAR ACCIDENT, HX OF 12/10/2009  . HYPERLIPIDEMIA 12/10/2009  . HYPERTENSION 12/10/2009  . Complication of anesthesia     "just can't get me up" (06/09/2013)  . Stroke [facility]) 2008    denies residual on 06/09/2013  . Tendonitis of wrist, right     PAST SURGICAL HISTORY: Past Surgical  History  Procedure Laterality Date  . Ovarian cyst removal  1990's    MEDICATIONS AT HOME: Prior to Admission medications   Medication Sig Start Date End Date Taking? Authorizing Provider  acetaminophen (TYLENOL) 325 MG tablet Take 2 tablets (650 mg total) by mouth every 6 (six) hours as needed. 06/10/13  Yes Belkys A Regalado, MD  albuterol (PROAIR HFA) 108 (90 BASE) MCG/ACT inhaler Inhale 2 puffs into the lungs every 6 (six) hours as needed for wheezing. 07/11/15  Yes Tiffany Daneil Dan, PA-C  clopidogrel (PLAVIX) 75 MG tablet Take 1 tablet (75 mg total) by mouth daily. 07/04/15  Yes Daniel J Angiulli, PA-C  HYDROcodone-acetaminophen (NORCO/VICODIN) 5-325 MG tablet Take 1 tablet by mouth every 6 (six) hours as needed for severe pain. 07/04/15  Yes Daniel J Angiulli, PA-C  losartan-hydrochlorothiazide (HYZAAR) 100-12.5 MG tablet Take 1 tablet by mouth daily. 07/04/15  Yes Daniel J Angiulli, PA-C  Multiple Vitamin (MULTIVITAMIN WITH MINERALS) TABS tablet Take 1 tablet by mouth daily.   Yes Historical Provider, MD  pantoprazole (PROTONIX) 40 MG tablet Take 1 tablet (40 mg total) by mouth daily. 07/04/15  Yes Daniel J Angiulli, PA-C  potassium chloride SA (K-DUR,KLOR-CON) 20 MEQ tablet Take 1 tablet (20 mEq total) by mouth daily. 07/04/15  Yes Daniel J Angiulli, PA-C  pravastatin (PRAVACHOL) 20 MG tablet Take 1 tablet (20 mg total) by mouth daily. 07/04/15  Yes Daniel J Angiulli, PA-C     Objective:   Filed Vitals:   07/11/15 0907  BP: 127/83  Pulse: 90  Temp: 97.7 F (36.5  C)  TempSrc: Oral  Resp: 16  Height: 5\' 5"  (1.651 m)  Weight: 177 lb (80.287 kg)  SpO2: 97%    Exam General appearance : Awake, alert, not in any distress. Speech Clear. Not toxic looking HEENT: Atraumatic and Normocephalic, pupils equally reactive to light and accomodation Neck: supple, no JVD. No cervical lymphadenopathy. No carotid bruit. Chest:Good air entry bilaterally, no added sounds  CVS: S1 S2 regular,  no murmurs.  Abdomen: Bowel sounds present, Non tender and not distended with no gaurding, rigidity or rebound. Extremities: B/L Lower Ext shows no edema, both legs are warm to touch; brace on left leg Neurology: Awake alert, and oriented X 3, CN II-XI, slightly sluggish on left sidet; left sided weakness   Data Review Lab Results  Component Value Date   HGBA1C 6.6* 06/12/2015     Assessment & Plan  1. Right MCA infarct with hx old CVA * 2  -Aggressive RF modification  -Cont Plavix  -Cont HH PT, OT, ST  -Make appt with Dr. Leonie Man for January 2. PVD  -Aggressive RF modification   3. HTN  -Cont antihypertensives  -DASH diet 4. HL  -cont Pravachol 5. Hx Asthma  -refill Albuterol   Return in about 2 weeks (around 07/25/2015).  The patient was given clear instructions to go to ER or return to medical center if symptoms don't improve, worsen or new problems develop. The patient verbalized understanding. The patient was told to call to get lab results if they haven't heard anything in the next week.   This note has been created with Surveyor, quantity. Any transcriptional errors are unintentional.    Zettie Pho, PA-C [facility] [place], [place]   07/11/2015, 9:31 AM

## 2015-07-11 NOTE — Telephone Encounter (Signed)
Rebecca Yoder, OT with [facility] needs to get a verbal 3w4.

## 2015-07-13 ENCOUNTER — Encounter: Payer: Self-pay | Admitting: Physical Medicine & Rehabilitation

## 2015-07-16 ENCOUNTER — Encounter: Payer: Self-pay | Admitting: Neurology

## 2015-07-16 ENCOUNTER — Ambulatory Visit (INDEPENDENT_AMBULATORY_CARE_PROVIDER_SITE_OTHER): Payer: Self-pay | Admitting: Neurology

## 2015-07-16 VITALS — BP 120/78 | HR 85 | Ht 65.0 in

## 2015-07-16 DIAGNOSIS — G8114 Spastic hemiplegia affecting left nondominant side: Secondary | ICD-10-CM

## 2015-07-16 NOTE — Patient Instructions (Addendum)
I had a long d/w patient about her recent stroke, risk for recurrent stroke/TIAs, personally independently reviewed imaging studies and stroke evaluation results and answered questions.Continue clopidogrel 75 mg daily  for secondary stroke prevention and maintain strict control of hypertension with blood pressure goal below 130/90, diabetes with hemoglobin A1c goal below 6.5% and lipids with LDL cholesterol goal below 100 mg/dL. continue ongoing home physical and occupational therapy followed later by outpatient therapy. I also advised the patient to eat a healthy diet with plenty of whole grains, cereals, fruits and vegetables, exercise regularly and maintain ideal body weight .We also talked briefly about fall and safety precautions. Followup in the future with me in  6 months or call earlier if necessary. Stroke Prevention Some medical conditions and behaviors are associated with an increased chance of having a stroke. You may prevent a stroke by making healthy choices and managing medical conditions. HOW CAN I REDUCE MY RISK OF HAVING A STROKE?   Stay physically active. Get at least 30 minutes of activity on most or all days.  Do not smoke. It may also be helpful to avoid exposure to secondhand smoke.  Limit alcohol use. Moderate alcohol use is considered to be:  No more than 2 drinks per day for men.  No more than 1 drink per day for nonpregnant women.  Eat healthy foods. This involves:  Eating 5 or more servings of fruits and vegetables a day.  Making dietary changes that address high blood pressure (hypertension), high cholesterol, diabetes, or obesity.  Manage your cholesterol levels.  Making food choices that are high in fiber and low in saturated fat, trans fat, and cholesterol may control cholesterol levels.  Take any prescribed medicines to control cholesterol as directed by your health care provider.  Manage your diabetes.  Controlling your carbohydrate and sugar intake is  recommended to manage diabetes.  Take any prescribed medicines to control diabetes as directed by your health care provider.  Control your hypertension.  Making food choices that are low in salt (sodium), saturated fat, trans fat, and cholesterol is recommended to manage hypertension.  Ask your health care provider if you need treatment to lower your blood pressure. Take any prescribed medicines to control hypertension as directed by your health care provider.  If you are 27-64 years of age, have your blood pressure checked every 3-5 years. If you are 32 years of age or older, have your blood pressure checked every year.  Maintain a healthy weight.  Reducing calorie intake and making food choices that are low in sodium, saturated fat, trans fat, and cholesterol are recommended to manage weight.  Stop drug abuse.  Avoid taking birth control pills.  Talk to your health care provider about the risks of taking birth control pills if you are over 91 years old, smoke, get migraines, or have ever had a blood clot.  Get evaluated for sleep disorders (sleep apnea).  Talk to your health care provider about getting a sleep evaluation if you snore a lot or have excessive sleepiness.  Take medicines only as directed by your health care provider.  For some people, aspirin or blood thinners (anticoagulants) are helpful in reducing the risk of forming abnormal blood clots that can lead to stroke. If you have the irregular heart rhythm of atrial fibrillation, you should be on a blood thinner unless there is a good reason you cannot take them.  Understand all your medicine instructions.  Make sure that other conditions (such as anemia  or atherosclerosis) are addressed. SEEK IMMEDIATE MEDICAL CARE IF:   You have sudden weakness or numbness of the face, arm, or leg, especially on one side of the body.  Your face or eyelid droops to one side.  You have sudden confusion.  You have trouble speaking  (aphasia) or understanding.  You have sudden trouble seeing in one or both eyes.  You have sudden trouble walking.  You have dizziness.  You have a loss of balance or coordination.  You have a sudden, severe headache with no known cause.  You have new chest pain or an irregular heartbeat. Any of these symptoms may represent a serious problem that is an emergency. Do not wait to see if the symptoms will go away. Get medical help at once. Call your local emergency services (911 in [place] Do not drive yourself to the hospital.   This information is not intended to replace advice given to you by your health care provider. Make sure you discuss any questions you have with your health care provider.   Document Released: 09/04/2004 Document Revised: 08/18/2014 Document Reviewed: 01/28/2013 Elsevier Interactive Patient Education 2016 [place] in the Home  Falls can cause injuries and can affect people from all age groups. There are many simple things that you can do to make your home safe and to help prevent falls. WHAT CAN I DO ON THE OUTSIDE OF MY HOME?  Regularly repair the edges of walkways and driveways and fix any cracks.  Remove high doorway thresholds.  Trim any shrubbery on the main path into your home.  Use bright outdoor lighting.  Clear walkways of debris and clutter, including tools and rocks.  Regularly check that handrails are securely fastened and in good repair. Both sides of any steps should have handrails.  Install guardrails along the edges of any raised decks or porches.  Have leaves, snow, and ice cleared regularly.  Use sand or salt on walkways during winter months.  In the garage, clean up any spills right away, including grease or oil spills. WHAT CAN I DO IN THE BATHROOM?  Use night lights.  Install grab bars by the toilet and in the tub and shower. Do not use towel bars as grab bars.  Use non-skid mats or decals on the floor  of the tub or shower.  If you need to sit down while you are in the shower, use a plastic, non-slip stool.[place] [employer]  Keep the floor dry. Immediately clean up any water that spills on the floor.  Remove soap buildup in the tub or shower on a regular basis.  Attach bath mats securely with double-sided non-slip rug tape.  Remove throw rugs and other tripping hazards from the floor. WHAT CAN I DO IN THE BEDROOM?  Use night lights.  Make sure that a bedside light is easy to reach.  Do not use oversized bedding that drapes onto the floor.  Have a firm chair that has side arms to use for getting dressed.  Remove throw rugs and other tripping hazards from the floor. WHAT CAN I DO IN THE KITCHEN?   Clean up any spills right away.  Avoid walking on wet floors.  Place frequently used items in easy-to-reach places.  If you need to reach for something above you, use a sturdy step stool that has a grab bar.  Keep electrical cables out of the way.  Do not use floor polish or wax that makes floors slippery. If you have to  use wax, make sure that it is non-skid floor wax.  Remove throw rugs and other tripping hazards from the floor. WHAT CAN I DO IN THE STAIRWAYS?  Do not leave any items on the stairs.  Make sure that there are handrails on both sides of the stairs. Fix handrails that are broken or loose. Make sure that handrails are as long as the stairways.  Check any carpeting to make sure that it is firmly attached to the stairs. Fix any carpet that is loose or worn.  Avoid having throw rugs at the top or bottom of stairways, or secure the rugs with carpet tape to prevent them from moving.  Make sure that you have a light switch at the top of the stairs and the bottom of the stairs. If you do not have them, have them installed. WHAT ARE SOME OTHER FALL PREVENTION TIPS?  Wear closed-toe shoes that fit well and support your feet. Wear shoes that have rubber soles or low heels.  When you  use a stepladder, make sure that it is completely opened and that the sides are firmly locked. Have someone hold the ladder while you are using it. Do not climb a closed stepladder.  Add color or contrast paint or tape to grab bars and handrails in your home. Place contrasting color strips on the first and last steps.  Use mobility aids as needed, such as canes, walkers, scooters, and crutches.  Turn on lights if it is dark. Replace any light bulbs that burn out.  Set up furniture so that there are clear paths. Keep the furniture in the same spot.  Fix any uneven floor surfaces.  Choose a carpet design that does not hide the edge of steps of a stairway.  Be aware of any and all pets.  Review your medicines with your healthcare provider. Some medicines can cause dizziness or changes in blood pressure, which increase your risk of falling. Talk with your health care provider about other ways that you can decrease your risk of falls. This may include working with a physical therapist or trainer to improve your strength, balance, and endurance.   This information is not intended to replace advice given to you by your health care provider. Make sure you discuss any questions you have with your health care provider.   Document Released: 07/18/2002 Document Revised: 12/12/2014 Document Reviewed: 09/01/2014 Elsevier Interactive Patient Education [employer].

## 2015-07-16 NOTE — Progress Notes (Signed)
[facility] [address] [place]. [place] [address] [phone]       OFFICE FOLLOW-UP NOTE  Rebecca Yoder Date of Birth:  Jul 07, 1966 Medical Record Number:  [id number]   HPI: 66 year lady with right brain subcortical infarct in October 2014 as well as remote right frontal insular infarct of cryptogenic etiology. Questionable small left caudate lacunar infarct. Vascular risk factors of Hypertension and Hyperlipidimia.Questionable PFO on TEE in 2008 but not seen on TCD Bubble study. Update 06/29/13 :She is seen for first office f/u after [facility] admission for stroke on 06/09/13. She developed left sided weakness at work and slurred speech.Symptoms were improving by the time she reached the hospital.Ct head showed remote right frontal infarct while MRI showed small right external capsule and right corona radiata infarct.questionable tiny left caudate infarct.Transthoraxic echo showed normal ejection fraction and carotid dopplers showed no significant stenosis.MRA brain was suboptimal with motion artefacts.Lipid profile was normal.She was started on clopidogrel but is having trouble affording it. She has done well without any recurrent symptoms and feels left sided strength is back to normal except mild diminished fine motor skills.She had extensive stroke evaluation in 2008 at time of her right frontal infarct including TEE  showing small PFO but TCD Bubble study was negative. Update 02/15/2014 ; She returns for followup today after  the last visit 6 months ago.. She continues to do well from the stroke standpoint without any recurrent or new stroke symptoms. She continues to have mild dragging of the left leg and heaviness as well as some diminished fine motor skills in the left hand but is unchanged. She continues to take Plavix and is tolerating it well without bruising or other side effects. She states her blood pressure is well controlled and today it is 130/8 sitter in the  office. She does have regular appointments with her primary physician. She has not had lipid profile checked in nearly a year now. Update 07/16/15 : She returns for follow-up today after recent hospital admission for recurrent stroke.She presented with sudden onset left sided weakness and right gaze deviation   (LKW 06/11/2015 at 1245). She was given tpa at 1359 after discussion with her boyfriend and herself. After CTA, angiogram to possibly pursue IA therapy was discussed, but family/patient declined this therapy.CTA head & neck intracranial atherosclerotic narrowing, occluded M1 at MCA. R sided top normal size lymph nodes. Transthoracic echo showed normal ejection fraction and no cardiac source of embolism. Post tPA CT scan of the head showed no significant hemorrhage. Lower extremity venous Dopplers were negative. LDL cholesterol was elevated at 86 mg percent. Hemoglobin A1c was borderline at 6.6. Patient had been taking aspirin and Plavix given prior to admission for her previous stroke. She was found to have   a small PFO on TEE done during her prior stroke in 2008 but transcranial Doppler bubble study had been negative at that time. Patient was advised to stop aspirin and stay on Plavix alone. She was transferred to inpatient rehabilitation and states now she is doing well since she has been discharged she is currently at home getting home physical and occupational therapy 2-3 times a week. She is able to walk with a walker with an AFO. She states her balance is poor but she is fortunately had no major falls. She is tolerating Plavix well without bleeding and only minor bruising. She states her blood pressure is well controlled and today it is 120/78. She is tolerating Pravachol well without myalgias  or arthralgias. She is living with her sister requires only mild help and supervision. ROS:   14 system review of systems is positive for diminished coordination ,gait difficulty, leg heaviness and weakness  only all other systems negative. PMH:  Past Medical History  Diagnosis Date  . ASTHMA 12/10/2009  . CEREBROVASCULAR ACCIDENT, HX OF 12/10/2009  . HYPERLIPIDEMIA 12/10/2009  . HYPERTENSION 12/10/2009  . Complication of anesthesia     "just can't get me up" (06/09/2013)  . Stroke [facility]) 2008    denies residual on 06/09/2013  . Tendonitis of wrist, right     Social History:  Social History   Social History  . Marital Status: Single    Spouse Name: N/A  . Number of Children: 0  . Years of Education: 12th   Occupational History  . [employer]    Social History Main Topics  . Smoking status: Never Smoker   . Smokeless tobacco: Never Used  . Alcohol Use: 0.0 oz/week    0 Standard drinks or equivalent per week     Comment: 06/09/2013 "might drink a couple times/yr"  . Drug Use: No  . Sexual Activity: Yes   Other Topics Concern  . Not on file   Social History Narrative    Medications:   Current Outpatient Prescriptions on File Prior to Visit  Medication Sig Dispense Refill  . acetaminophen (TYLENOL) 325 MG tablet Take 2 tablets (650 mg total) by mouth every 6 (six) hours as needed. 20 tablet 0  . albuterol (PROAIR HFA) 108 (90 BASE) MCG/ACT inhaler Inhale 2 puffs into the lungs every 6 (six) hours as needed for wheezing. 9 g 5  . clopidogrel (PLAVIX) 75 MG tablet Take 1 tablet (75 mg total) by mouth daily. 30 tablet 1  . HYDROcodone-acetaminophen (NORCO/VICODIN) 5-325 MG tablet Take 1 tablet by mouth every 6 (six) hours as needed for severe pain. 60 tablet 0  . losartan-hydrochlorothiazide (HYZAAR) 100-12.5 MG tablet Take 1 tablet by mouth daily. 90 tablet 2  . Multiple Vitamin (MULTIVITAMIN WITH MINERALS) TABS tablet Take 1 tablet by mouth daily.    . pantoprazole (PROTONIX) 40 MG tablet Take 1 tablet (40 mg total) by mouth daily. 30 tablet 1  . potassium chloride SA (K-DUR,KLOR-CON) 20 MEQ tablet Take 1 tablet (20 mEq total) by mouth daily. 30 tablet 1  . pravastatin (PRAVACHOL) 20 MG  tablet Take 1 tablet (20 mg total) by mouth daily. 30 tablet 2   No current facility-administered medications on file prior to visit.    Allergies:   Allergies  Allergen Reactions  . Penicillins Rash    Physical Exam General: Frail young African-American lady, seated, in no evident distress Head: head normocephalic and atraumatic.   Neck: supple with no carotid or supraclavicular bruits Cardiovascular: regular rate and rhythm, no murmurs Musculoskeletal: no deformity Skin:  no rash/petichiae Vascular:  Normal pulses all extremities Filed Vitals:   07/16/15 1138  BP: 120/78  Pulse: 85    Neurologic Exam Mental Status: Awake and fully alert. Oriented to place and time. Recent and remote memory intact. Attention span, concentration and fund of knowledge appropriate. Mood and affect appropriate.  Cranial Nerves: Fundoscopic exam reveals sharp disc margins. Pupils equal, briskly reactive to light. Extraocular movements full without nystagmus. Visual fields full to confrontation. Hearing intact. Moderate left lower facial weakness. Facial sensation intact. Face, tongue, palate moves normally and symmetrically.  Motor: Normal bulk and tone. Spastic left hemiparesis grade 3/5 strength on the left with significant weakness  of left grip, intrinsic hand muscles, left ankle dorsiflexor with foot drop. Sensory.: Diminished sensation to touch and pinprick and vibratory sensation on the left hemibody.  Coordination: Rapid alternating movements normal in all extremities on the right but impaired on the left. Finger-to-nose and heel-to-shin performed accurately on the right only Gait and Station: Deferred as patient is in a wheelchair and did not bring her walker  Reflexes: 2+ and asymmetric and brisker on the left. Toes downgoing.   NIHSS  7 Modified [place]in  3   ASSESSMENT: 89 year lady with right brain subcortical infarct in October 2014 as well as remote right frontal insular infarct of  cryptogenic etiology. Questionable small left caudate lacunar infarct. Recent right basal ganglia infarct in October 2016 due to progressive occlusion of the right middle cerebral artery treated with IV TPA with residual spastic left hemiparesis Vascular risk factors of Hypertension and Hyperlipidimia.Questionable PFO on TEE in 2008 but not seen on TCD Bubble study. She has incidental enlarged lymph nodes in the neck. Recommend follow-up with primary care physician for the same.    PLAN: I had a long d/w patient about her recent stroke, risk for recurrent stroke/TIAs, personally independently reviewed imaging studies and stroke evaluation results and answered questions.Continue clopidogrel 75 mg daily  for secondary stroke prevention and maintain strict control of hypertension with blood pressure goal below 130/90, diabetes with hemoglobin A1c goal below 6.5% and lipids with LDL cholesterol goal below 100 mg/dL. continue ongoing home physical and occupational therapy followed later by outpatient therapy. I also advised the patient to eat a healthy diet with plenty of whole grains, cereals, fruits and vegetables, exercise regularly and maintain ideal body weight .We also talked briefly about fall and safety precautions. She has incidental enlarged lymph nodes in the neck. Recommend follow-up with primary care physician for the same. Greater than 50% time during this 30 minute visit was spent on counseling and coordination of care Followup in the future with me in  6 months or call earlier if necessary.               Antony Contras, MD

## 2015-07-20 DIAGNOSIS — I69815 Cognitive social or emotional deficit following other cerebrovascular disease: Secondary | ICD-10-CM | POA: Diagnosis not present

## 2015-07-20 DIAGNOSIS — I69354 Hemiplegia and hemiparesis following cerebral infarction affecting left non-dominant side: Secondary | ICD-10-CM | POA: Diagnosis not present

## 2015-07-20 DIAGNOSIS — J45909 Unspecified asthma, uncomplicated: Secondary | ICD-10-CM | POA: Diagnosis not present

## 2015-07-20 DIAGNOSIS — I110 Hypertensive heart disease with heart failure: Secondary | ICD-10-CM | POA: Diagnosis not present

## 2015-07-31 ENCOUNTER — Encounter: Payer: Self-pay | Admitting: Family Medicine

## 2015-07-31 ENCOUNTER — Ambulatory Visit: Payer: Medicaid Other | Attending: Family Medicine | Admitting: Family Medicine

## 2015-07-31 VITALS — BP 126/77 | HR 92 | Temp 97.8°F | Resp 16

## 2015-07-31 DIAGNOSIS — B9620 Unspecified Escherichia coli [E. coli] as the cause of diseases classified elsewhere: Secondary | ICD-10-CM

## 2015-07-31 DIAGNOSIS — E876 Hypokalemia: Secondary | ICD-10-CM | POA: Diagnosis not present

## 2015-07-31 DIAGNOSIS — E119 Type 2 diabetes mellitus without complications: Secondary | ICD-10-CM | POA: Insufficient documentation

## 2015-07-31 DIAGNOSIS — R350 Frequency of micturition: Secondary | ICD-10-CM | POA: Diagnosis not present

## 2015-07-31 DIAGNOSIS — L309 Dermatitis, unspecified: Secondary | ICD-10-CM

## 2015-07-31 DIAGNOSIS — R414 Neurologic neglect syndrome: Secondary | ICD-10-CM | POA: Diagnosis not present

## 2015-07-31 DIAGNOSIS — N390 Urinary tract infection, site not specified: Secondary | ICD-10-CM

## 2015-07-31 DIAGNOSIS — I10 Essential (primary) hypertension: Secondary | ICD-10-CM

## 2015-07-31 LAB — GLUCOSE, POCT (MANUAL RESULT ENTRY): POC Glucose: 95 mg/dl (ref 70–99)

## 2015-07-31 LAB — POCT URINALYSIS DIPSTICK
Bilirubin, UA: NEGATIVE
GLUCOSE UA: NEGATIVE
KETONES UA: NEGATIVE
Nitrite, UA: POSITIVE
SPEC GRAV UA: 1.025
UROBILINOGEN UA: 0.2
pH, UA: 5

## 2015-07-31 MED ORDER — TRIAMCINOLONE ACETONIDE 0.5 % EX OINT: 1.0000 "application " | TOPICAL_OINTMENT | Freq: Two times a day (BID) | CUTANEOUS | Status: DC

## 2015-07-31 MED ORDER — LOSARTAN POTASSIUM 100 MG PO TABS: 100.0000 mg | ORAL_TABLET | Freq: Every day | ORAL | Status: DC

## 2015-07-31 NOTE — Assessment & Plan Note (Signed)
Frequency w/o dysuria, abnormal UA, not a clean catch  Suspect HCTZ CBG normal  Dc HCTZ Urine culture

## 2015-07-31 NOTE — Assessment & Plan Note (Signed)
Eczema L antecubital fossa Steroid cream

## 2015-07-31 NOTE — Assessment & Plan Note (Signed)
On supplemental K+ Taking hctz  D/c hctz Check CMP and mag

## 2015-07-31 NOTE — Assessment & Plan Note (Signed)
Controlled diabetes A1c goal < 6.5 Plan low carb diet, exercise, start metformin of CBGs trend up

## 2015-07-31 NOTE — Assessment & Plan Note (Signed)
A: patient with hypokalemia and urinary frequency on hyzaar P: D/c hyzaar Start losartan CMP and Mag

## 2015-07-31 NOTE — Progress Notes (Signed)
Subjective:  Patient ID: Rebecca Yoder, female    DOB: Feb 07, 1966  Age: 49 y.o. MRN: [id number]  CC: Urinary Frequency; Cerebrovascular Accident; and Diabetes   HPI Rebecca Yoder presents for   1. Urinary frequency: Urinating every 2 hrs or so. No dysuria or hematuria. Taking hyzaar.   2. CVA: CVA with L sided weakness and neglect. She has had two falls when getting up from toilet. Hit L upper arm. There is bruising. She is taking all meds. Weakness is improving.   3. DM2: she was not aware she was diabetic. She enjoys sweets. No polydipsia. She has polyuria.   Social History  Substance Use Topics  . Smoking status: Never Smoker   . Smokeless tobacco: Never Used  . Alcohol Use: 0.0 oz/week    0 Standard drinks or equivalent per week     Comment: 06/09/2013 "might drink a couple times/yr"    Outpatient Prescriptions Prior to Visit  Medication Sig Dispense Refill  . acetaminophen (TYLENOL) 325 MG tablet Take 2 tablets (650 mg total) by mouth every 6 (six) hours as needed. 20 tablet 0  . albuterol (PROAIR HFA) 108 (90 BASE) MCG/ACT inhaler Inhale 2 puffs into the lungs every 6 (six) hours as needed for wheezing. 9 g 5  . clopidogrel (PLAVIX) 75 MG tablet Take 1 tablet (75 mg total) by mouth daily. 30 tablet 1  . HYDROcodone-acetaminophen (NORCO/VICODIN) 5-325 MG tablet Take 1 tablet by mouth every 6 (six) hours as needed for severe pain. 60 tablet 0  . losartan-hydrochlorothiazide (HYZAAR) 100-12.5 MG tablet Take 1 tablet by mouth daily. 90 tablet 2  . Multiple Vitamin (MULTIVITAMIN WITH MINERALS) TABS tablet Take 1 tablet by mouth daily.    . pantoprazole (PROTONIX) 40 MG tablet Take 1 tablet (40 mg total) by mouth daily. 30 tablet 1  . potassium chloride SA (K-DUR,KLOR-CON) 20 MEQ tablet Take 1 tablet (20 mEq total) by mouth daily. 30 tablet 1  . pravastatin (PRAVACHOL) 20 MG tablet Take 1 tablet (20 mg total) by mouth daily. 30 tablet 2   No facility-administered  medications prior to visit.    ROS Review of Systems  Constitutional: Negative for fever and chills.  Eyes: Negative for visual disturbance.  Respiratory: Negative for shortness of breath.   Cardiovascular: Negative for chest pain.  Gastrointestinal: Negative for abdominal pain and blood in stool.  Endocrine: Positive for polyuria. Negative for polydipsia and polyphagia.  Genitourinary: Positive for frequency.  Musculoskeletal: Negative for back pain and arthralgias.  Skin: Positive for rash.  Allergic/Immunologic: Negative for immunocompromised state.  Neurological: Positive for weakness (L sided ).  Hematological: Negative for adenopathy. Does not bruise/bleed easily.  Psychiatric/Behavioral: Negative for suicidal ideas and dysphoric mood.    Objective:  BP 126/77 mmHg  Pulse 92  Temp(Src) 97.8 F (36.6 C) (Oral)  Resp 16  SpO2 97%  LMP 07/15/2015  BP/Weight 07/31/2015 07/16/2015 99991111  Systolic BP 123XX123 [id number]  Diastolic BP 77 78 83  Wt. (Lbs) - - 177  BMI - - 29.45   Physical Exam  Constitutional: She is oriented to person, place, and time. She appears well-developed and well-nourished. No distress.  HENT:  Head: Normocephalic and atraumatic.  Cardiovascular: Normal rate, regular rhythm, normal heart sounds and intact distal pulses.   Pulmonary/Chest: Effort normal and breath sounds normal.  Musculoskeletal: She exhibits no edema.  Neurological: She is alert and oriented to person, place, and time. She displays no tremor. She exhibits abnormal muscle tone.  L sided weakness   Skin: Skin is warm and dry. Rash (hyeperkeratotic skin in L antecubital fossa ) noted.  Psychiatric: She has a normal mood and affect.   Lab Results  Component Value Date   HGBA1C 6.6* 06/12/2015   CBG 95  Assessment & Plan:   Problem List Items Addressed This Visit    Diabetes type 2, controlled ([place]) - Primary (Chronic)    Controlled diabetes A1c goal < 6.5 Plan low carb diet,  exercise, start metformin of CBGs trend up       Relevant Medications   losartan (COZAAR) 100 MG tablet   Other Relevant Orders   Glucose (CBG) (Completed)   Microalbumin/Creatinine Ratio, Urine   COMPLETE METABOLIC PANEL WITH GFR   Eczema    Eczema L antecubital fossa Steroid cream       Relevant Medications   triamcinolone ointment (KENALOG) 0.5 %   Essential hypertension (Chronic)    A: patient with hypokalemia and urinary frequency on hyzaar P: D/c hyzaar Start losartan CMP and Mag       Relevant Medications   losartan (COZAAR) 100 MG tablet   Other Relevant Orders   COMPLETE METABOLIC PANEL WITH GFR   Hypokalemia    On supplemental K+ Taking hctz  D/c hctz Check CMP and mag       Relevant Orders   COMPLETE METABOLIC PANEL WITH GFR   Magnesium   Left-sided neglect (Chronic)   Urinary frequency    Frequency w/o dysuria, abnormal UA, not a clean catch  Suspect HCTZ CBG normal  Dc HCTZ Urine culture       Relevant Orders   POCT urinalysis dipstick (Completed)   Urine culture      No orders of the defined types were placed in this encounter.    Follow-up: No Follow-up on file.   Boykin Nearing MD

## 2015-07-31 NOTE — Patient Instructions (Addendum)
Rebecca Yoder was seen today for urinary frequency, cerebrovascular accident and diabetes.  Diagnoses and all orders for this visit:  Controlled type 2 diabetes mellitus without complication, without long-term current use of insulin (HCC) -     Glucose (CBG) -     Microalbumin/Creatinine Ratio, Urine -     COMPLETE METABOLIC PANEL WITH GFR  Left-sided neglect  Urinary frequency -     POCT urinalysis dipstick -     Urine culture  Hypokalemia -     COMPLETE METABOLIC PANEL WITH GFR -     Magnesium  Essential hypertension -     COMPLETE METABOLIC PANEL WITH GFR -     losartan (COZAAR) 100 MG tablet; Take 1 tablet (100 mg total) by mouth daily.  Eczema -     triamcinolone ointment (KENALOG) 0.5 %; Apply 1 application topically 2 (two) times daily.    F/u in 4 weeks for pharmacy BP check and potassium check  F/u in 8 weeks for pap smear   Dr. Adrian Blackwater    Basic Carbohydrate Counting for Diabetes Mellitus Carbohydrate counting is a method for keeping track of the amount of carbohydrates you eat. Eating carbohydrates naturally increases the level of sugar (glucose) in your blood, so it is important for you to know the amount that is okay for you to have in every meal. Carbohydrate counting helps keep the level of glucose in your blood within normal limits. The amount of carbohydrates allowed is different for every person. A dietitian can help you calculate the amount that is right for you. Once you know the amount of carbohydrates you can have, you can count the carbohydrates in the foods you want to eat. Carbohydrates are found in the following foods:  Grains, such as breads and cereals.  Dried beans and soy products.  Starchy vegetables, such as potatoes, peas, and corn.  Fruit and fruit juices.  Milk and yogurt.  Sweets and snack foods, such as cake, cookies, candy, chips, soft drinks, and fruit drinks. CARBOHYDRATE COUNTING There are two ways to count the carbohydrates in your  food. You can use either of the methods or a combination of both. Reading the "Nutrition Facts" on [place] The "Nutrition Facts" is an area that is included on the labels of almost all packaged food and beverages in the [place]. It includes the serving size of that food or beverage and information about the nutrients in each serving of the food, including the grams (g) of carbohydrate per serving.  Decide the number of servings of this food or beverage that you will be able to eat or drink. Multiply that number of servings by the number of grams of carbohydrate that is listed on the label for that serving. The total will be the amount of carbohydrates you will be having when you eat or drink this food or beverage. Learning Standard Serving Sizes of Food When you eat food that is not packaged or does not include "Nutrition Facts" on the label, you need to measure the servings in order to count the amount of carbohydrates.A serving of most carbohydrate-rich foods contains about 15 g of carbohydrates. The following list includes serving sizes of carbohydrate-rich foods that provide 15 g ofcarbohydrate per serving:   1 slice of bread (1 oz) or 1 six-inch tortilla.    of a hamburger bun or English muffin.  4-6 crackers.   cup unsweetened dry cereal.    cup hot cereal.   cup rice or pasta.  cup mashed potatoes or  of a large baked potato.  1 cup fresh fruit or one small piece of fruit.    cup canned or frozen fruit or fruit juice.  1 cup milk.   cup plain fat-free yogurt or yogurt sweetened with artificial sweeteners.   cup cooked dried beans or starchy vegetable, such as peas, corn, or potatoes.  Decide the number of standard-size servings that you will eat. Multiply that number of servings by 15 (the grams of carbohydrates in that serving). For example, if you eat 2 cups of strawberries, you will have eaten 2 servings and 30 g of carbohydrates (2 servings x 15 g  = 30 g). For foods such as soups and casseroles, in which more than one food is mixed in, you will need to count the carbohydrates in each food that is included. EXAMPLE OF CARBOHYDRATE COUNTING Sample Dinner  3 oz chicken breast.   cup of brown rice.   cup of corn.  1 cup milk.   1 cup strawberries with sugar-free whipped topping.  Carbohydrate Calculation Step 1: Identify the foods that contain carbohydrates:   Rice.   Corn.   Milk.   Strawberries. Step 2:Calculate the number of servings eaten of each:   2 servings of rice.   1 serving of corn.   1 serving of milk.   1 serving of strawberries. Step 3: Multiply each of those number of servings by 15 g:   2 servings of rice x 15 g = 30 g.   1 serving of corn x 15 g = 15 g.   1 serving of milk x 15 g = 15 g.   1 serving of strawberries x 15 g = 15 g. Step 4: Add together all of the amounts to find the total grams of carbohydrates eaten: 30 g + 15 g + 15 g + 15 g = 75 g.   This information is not intended to replace advice given to you by your health care provider. Make sure you discuss any questions you have with your health care provider.   Document Released: 07/28/2005 Document Revised: 08/18/2014 Document Reviewed: 06/24/2013 Elsevier Interactive Patient Education [employer].

## 2015-07-31 NOTE — Progress Notes (Signed)
F/U Stroke  No pain today  No suicidal thought in the past two week No tobacco user

## 2015-08-01 ENCOUNTER — Telehealth: Payer: Self-pay | Admitting: *Deleted

## 2015-08-01 LAB — COMPLETE METABOLIC PANEL WITH GFR
ALBUMIN: 4.3 g/dL (ref 3.6–5.1)
ALK PHOS: 52 U/L (ref 33–115)
ALT: 18 U/L (ref 6–29)
AST: 17 U/L (ref 10–35)
BUN: 9 mg/dL (ref 7–25)
CALCIUM: 9.8 mg/dL (ref 8.6–10.2)
CO2: 26 mmol/L (ref 20–31)
Chloride: 101 mmol/L (ref 98–110)
Creat: 0.74 mg/dL (ref 0.50–1.10)
GFR, Est Non African American: >89 mL/min (ref >=60)
Glucose, Bld: 92 mg/dL (ref 65–99)
POTASSIUM: 3.9 mmol/L (ref 3.5–5.3)
Sodium: 142 mmol/L (ref 135–146)
Total Bilirubin: 0.5 mg/dL (ref 0.2–1.2)
Total Protein: 7.6 g/dL (ref 6.1–8.1)

## 2015-08-01 LAB — MICROALBUMIN / CREATININE URINE RATIO
CREATININE, URINE: 265 mg/dL (ref 20–320)
MICROALB UR: 5.2 mg/dL
Microalb Creat Ratio: 20 mcg/mg creat (ref <30)

## 2015-08-01 LAB — MAGNESIUM: MAGNESIUM: 1.5 mg/dL (ref 1.5–2.5)

## 2015-08-01 NOTE — Telephone Encounter (Signed)
[name] OT [facility] called to let Dr Letta Pate know she had to discharge her a few days early due to medicaid coverage ran out.  She would recommend outpt OT if medicaid will cover.

## 2015-08-01 NOTE — Addendum Note (Signed)
Addended by: Boykin Nearing on: 08/01/2015 08:18 AM   Modules accepted: Orders, Medications, SmartSet

## 2015-08-02 LAB — URINE CULTURE: Colony Count: >=100000

## 2015-08-03 ENCOUNTER — Telehealth: Payer: Self-pay | Admitting: *Deleted

## 2015-08-03 DIAGNOSIS — B9620 Unspecified Escherichia coli [E. coli] as the cause of diseases classified elsewhere: Secondary | ICD-10-CM | POA: Insufficient documentation

## 2015-08-03 DIAGNOSIS — N390 Urinary tract infection, site not specified: Secondary | ICD-10-CM

## 2015-08-03 MED ORDER — NITROFURANTOIN MONOHYD MACRO 100 MG PO CAPS: 100.0000 mg | ORAL_CAPSULE | Freq: Two times a day (BID) | ORAL | Status: DC

## 2015-08-03 NOTE — Telephone Encounter (Signed)
-----   Message from Boykin Nearing, MD sent at 08/03/2015  8:58 AM EST ----- > 100 K E. Coli on urine culture macrobid ordered

## 2015-08-03 NOTE — Telephone Encounter (Signed)
-----   Message from Boykin Nearing, MD sent at 08/01/2015  8:18 AM EST ----- Normal CMP Low normal potassium, normal mag Normal urine microalbumin  Still awaiting urine culture STOP HCTZ per orders with reduction of HCTZ risk of low potassium declines. Patient advised to also stop potassium supplement.

## 2015-08-03 NOTE — Addendum Note (Signed)
Addended by: Boykin Nearing on: 08/03/2015 08:58 AM   Modules accepted: Orders, SmartSet

## 2015-08-03 NOTE — Telephone Encounter (Signed)
Date of birth verified by pt  Results given  E-coli on urine Macrobid send to [facility]  Normal CMP, Low potassium Advised to Stop HCTZ and potassium supplement   Pt verbalized understanding

## 2015-08-03 NOTE — Telephone Encounter (Signed)
-----   Message from Boykin Nearing, MD sent at 08/03/2015  8:57 AM EST ----- > 100 K E. Coli on urine culture macrobid ordered

## 2015-08-07 ENCOUNTER — Telehealth: Payer: Self-pay | Admitting: Family Medicine

## 2015-08-07 ENCOUNTER — Ambulatory Visit: Payer: Self-pay | Admitting: Family Medicine

## 2015-08-07 DIAGNOSIS — Z91018 Allergy to other foods: Secondary | ICD-10-CM

## 2015-08-07 NOTE — Telephone Encounter (Signed)
Patient was last seen on the dec 20th to establish care and missed her f/u appt today for arm pain. Pt is wondering if she can be prescribed some pain medicine or if you can advise some relief until she is able to get an appt sometime in January. Thank you.

## 2015-08-07 NOTE — Telephone Encounter (Signed)
Patient called stating that she is having body pain and would like something to treat. Please f/u

## 2015-08-08 DIAGNOSIS — Z91018 Allergy to other foods: Secondary | ICD-10-CM | POA: Insufficient documentation

## 2015-08-08 NOTE — Telephone Encounter (Signed)
Patient called stating that she is in a lot of pain, please f/u with pt.

## 2015-08-08 NOTE — Telephone Encounter (Signed)
Referral for allergist placed  I recommend tylenol for arm pain 1000 mg up to 3 times a day until I can evaluate

## 2015-08-08 NOTE — Telephone Encounter (Signed)
Referral not needed  Pt stated taking Tylenol TID and not helping  Has AF/U appointment  This Friday

## 2015-08-09 NOTE — Addendum Note (Signed)
Addended by: Boykin Nearing on: 08/09/2015 01:06 PM   Modules accepted: Orders

## 2015-08-10 ENCOUNTER — Encounter: Payer: Self-pay | Admitting: Family Medicine

## 2015-08-10 ENCOUNTER — Ambulatory Visit: Payer: Medicaid Other | Attending: Family Medicine | Admitting: Family Medicine

## 2015-08-10 VITALS — BP 131/71 | HR 70 | Temp 98.4°F | Resp 18 | Ht 65.0 in | Wt 176.4 lb

## 2015-08-10 DIAGNOSIS — M25512 Pain in left shoulder: Secondary | ICD-10-CM | POA: Diagnosis not present

## 2015-08-10 DIAGNOSIS — Z8673 Personal history of transient ischemic attack (TIA), and cerebral infarction without residual deficits: Secondary | ICD-10-CM | POA: Insufficient documentation

## 2015-08-10 DIAGNOSIS — E119 Type 2 diabetes mellitus without complications: Secondary | ICD-10-CM | POA: Diagnosis not present

## 2015-08-10 LAB — GLUCOSE, POCT (MANUAL RESULT ENTRY): POC GLUCOSE: 85 mg/dL (ref 70–99)

## 2015-08-10 MED ORDER — METHOCARBAMOL 500 MG PO TABS: 500.0000 mg | ORAL_TABLET | Freq: Every evening | ORAL | Status: DC | PRN

## 2015-08-10 MED ORDER — ACETAMINOPHEN-CODEINE #3 300-30 MG PO TABS: 1.0000 | ORAL_TABLET | Freq: Three times a day (TID) | ORAL | Status: DC | PRN

## 2015-08-10 NOTE — Patient Instructions (Addendum)
Rebecca Yoder was seen today for arm pain.  Diagnoses and all orders for this visit:  Controlled type 2 diabetes mellitus without complication, without long-term current use of insulin (HCC) -     Cancel: POCT A1C -     Glucose (CBG)  Left shoulder pain -     acetaminophen-codeine (TYLENOL #3) 300-30 MG tablet; Take 1 tablet by mouth every 8 (eight) hours as needed for moderate pain. -     AMB referral to sports medicine -     methocarbamol (ROBAXIN) 500 MG tablet; Take 1 tablet (500 mg total) by mouth at bedtime as needed for muscle spasms. -     Ambulatory referral to Physical Therapy   You will be called when disability paperwork is ready   F/u in 3 months for diabetes and HTN,  sooner if needed   Dr. Adrian Blackwater

## 2015-08-10 NOTE — Progress Notes (Addendum)
Subjective:  Patient ID: [name], female    DOB: [birth year]  Age: 49 y.o. MRN: [id number]  CC: Arm Pain   HPI ZONNA BATTLES presents for    1. L shoulder pain: started one week ago. Lateral and posterior shoulder pain. Worse with arm movement. She has stroke with L hemiplegia. Pain is sharp and achy. Pain radiates down arm at times. There is no CP, SOB, sweating, nausea. She has tried vicodin in past for pain with relief.   2. E coli UTI: she took macrobid. Frequency has resolved. No dysuria.   3. Disability: the most recent stroke was her 4th and worse stroke. She has not been able to return to work. She has persistent L sided weakness. She has brought in disability paperwork for her work for me to complete.   Social History  Substance Use Topics  . Smoking status: Never Smoker   . Smokeless tobacco: Never Used  . Alcohol Use: 0.0 oz/week    0 Standard drinks or equivalent per week     Comment: 06/09/2013 "might drink a couple times/yr"    Outpatient Prescriptions Prior to Visit  Medication Sig Dispense Refill  . acetaminophen (TYLENOL) 325 MG tablet Take 2 tablets (650 mg total) by mouth every 6 (six) hours as needed. 20 tablet 0  . albuterol (PROAIR HFA) 108 (90 BASE) MCG/ACT inhaler Inhale 2 puffs into the lungs every 6 (six) hours as needed for wheezing. 9 g 5  . clopidogrel (PLAVIX) 75 MG tablet Take 1 tablet (75 mg total) by mouth daily. 30 tablet 1  . HYDROcodone-acetaminophen (NORCO/VICODIN) 5-325 MG tablet Take 1 tablet by mouth every 6 (six) hours as needed for severe pain. 60 tablet 0  . losartan (COZAAR) 100 MG tablet Take 1 tablet (100 mg total) by mouth daily. 30 tablet 5  . Multiple Vitamin (MULTIVITAMIN WITH MINERALS) TABS tablet Take 1 tablet by mouth daily.    . nitrofurantoin, macrocrystal-monohydrate, (MACROBID) 100 MG capsule Take 1 capsule (100 mg total) by mouth 2 (two) times daily. 10 capsule 0  . pantoprazole (PROTONIX) 40 MG tablet Take 1  tablet (40 mg total) by mouth daily. 30 tablet 1  . pravastatin (PRAVACHOL) 20 MG tablet Take 1 tablet (20 mg total) by mouth daily. 30 tablet 2  . triamcinolone ointment (KENALOG) 0.5 % Apply 1 application topically 2 (two) times daily. 30 g 0   No facility-administered medications prior to visit.    ROS Review of Systems  Constitutional: Negative for fever and chills.  Eyes: Negative for visual disturbance.  Respiratory: Negative for shortness of breath.   Cardiovascular: Negative for chest pain.  Gastrointestinal: Negative for abdominal pain and blood in stool.  Endocrine: Negative for polydipsia, polyphagia and polyuria.  Genitourinary: Negative for frequency.  Musculoskeletal: Positive for arthralgias. Negative for back pain.  Skin: Negative for rash.  Allergic/Immunologic: Negative for immunocompromised state.  Neurological: Positive for weakness (L sided ).  Hematological: Negative for adenopathy. Does not bruise/bleed easily.  Psychiatric/Behavioral: Negative for suicidal ideas and dysphoric mood.    Objective:  BP 131/71 mmHg  Pulse 70  Temp(Src) 98.4 F (36.9 C) (Oral)  Resp 18  Ht 5\' 5"  (1.651 m)  Wt 176 lb 6.4 oz (80.015 kg)  BMI 29.35 kg/m2  SpO2 100%  LMP 07/15/2015  BP/Weight 08/10/2015 07/31/2015 123XX123  Systolic BP [id number] 123XX123 123456  Diastolic BP 71 77 78  Wt. (Lbs) 176.4 - -  BMI 29.35 - -  Physical Exam  Constitutional: She is oriented to person, place, and time. She appears well-developed and well-nourished. No distress.  HENT:  Head: Normocephalic and atraumatic.  Cardiovascular: Intact distal pulses.   Pulmonary/Chest: Effort normal.  Musculoskeletal: She exhibits no edema.       Left shoulder: She exhibits decreased range of motion, pain, spasm and decreased strength. She exhibits no tenderness, no bony tenderness, no swelling, no effusion, no crepitus, no deformity, no laceration and normal pulse.  Neurological: She is alert and oriented to  person, place, and time.  Skin: Skin is warm and dry. No rash noted.  Psychiatric: She has a normal mood and affect.   Lab Results  Component Value Date   HGBA1C 6.6* 06/12/2015   CBG 85   Depression screen [facility] 2/9 08/10/2015 07/31/2015 07/11/2015  Decreased Interest 0 0 0  Down, Depressed, Hopeless 0 0 0  PHQ - 2 Score 0 0 0    Assessment & Plan:   Problem List Items Addressed This Visit    Diabetes type 2, controlled ([place]) - Primary (Chronic)   Relevant Orders   Glucose (CBG) (Completed)   Left shoulder pain   Relevant Medications   acetaminophen-codeine (TYLENOL #3) 300-30 MG tablet   methocarbamol (ROBAXIN) 500 MG tablet   Other Relevant Orders   AMB referral to sports medicine   Ambulatory referral to Physical Therapy      No orders of the defined types were placed in this encounter.    Follow-up: No Follow-up on file.   Boykin Nearing MD

## 2015-08-10 NOTE — Assessment & Plan Note (Signed)
A: L shoulder pain in setting of hemiparesis. Suspect adhesive capsulitis. Patient on plavix, will avoid oral NSAID. P: Sports medicine  referral for possible steroid injection PT tylenol #3 Oral robaxin q hs prn

## 2015-08-10 NOTE — Assessment & Plan Note (Signed)
Resolved

## 2015-08-10 NOTE — Progress Notes (Signed)
Patient here for Left Arm Pain.  Patient states pain has been present for a week. The pain radiates to her back. Pain is present in stroke weak side.

## 2015-08-14 ENCOUNTER — Ambulatory Visit (HOSPITAL_BASED_OUTPATIENT_CLINIC_OR_DEPARTMENT_OTHER): Payer: Medicaid Other | Admitting: Physical Medicine & Rehabilitation

## 2015-08-14 ENCOUNTER — Encounter: Payer: Self-pay | Admitting: Physical Medicine & Rehabilitation

## 2015-08-14 ENCOUNTER — Encounter: Payer: Medicaid Other | Attending: Physical Medicine & Rehabilitation

## 2015-08-14 VITALS — BP 156/91 | HR 63 | Resp 14

## 2015-08-14 DIAGNOSIS — M7502 Adhesive capsulitis of left shoulder: Secondary | ICD-10-CM

## 2015-08-14 DIAGNOSIS — M25512 Pain in left shoulder: Secondary | ICD-10-CM | POA: Diagnosis not present

## 2015-08-14 DIAGNOSIS — G8194 Hemiplegia, unspecified affecting left nondominant side: Secondary | ICD-10-CM

## 2015-08-14 DIAGNOSIS — S43002A Unspecified subluxation of left shoulder joint, initial encounter: Secondary | ICD-10-CM

## 2015-08-14 DIAGNOSIS — I69954 Hemiplegia and hemiparesis following unspecified cerebrovascular disease affecting left non-dominant side: Secondary | ICD-10-CM | POA: Diagnosis not present

## 2015-08-14 DIAGNOSIS — M7542 Impingement syndrome of left shoulder: Secondary | ICD-10-CM | POA: Insufficient documentation

## 2015-08-14 DIAGNOSIS — Z8673 Personal history of transient ischemic attack (TIA), and cerebral infarction without residual deficits: Secondary | ICD-10-CM | POA: Diagnosis present

## 2015-08-14 DIAGNOSIS — I69398 Other sequelae of cerebral infarction: Secondary | ICD-10-CM | POA: Diagnosis not present

## 2015-08-14 DIAGNOSIS — R269 Unspecified abnormalities of gait and mobility: Secondary | ICD-10-CM

## 2015-08-14 DIAGNOSIS — M7500 Adhesive capsulitis of unspecified shoulder: Secondary | ICD-10-CM | POA: Insufficient documentation

## 2015-08-14 NOTE — Progress Notes (Signed)
Subjective:    Patient ID: Rebecca Yoder, female    DOB: [birth year], 50 y.o.   MRN: [id number] 50 year old right-handed female with a history of hypertension, previous right brain CVA 2014, remote right frontal insular infarct 2008 with known PFO maintained on aspirin. Lives with her sister and mother who has cancer.  Presented June 11, 2015 with sudden onset of left-sided weakness, slurred speech, right gaze preference.  Cranial CT scan negative.  The patient did receive tPA.  CT of the head and neck showed occluded M1 segment of the right middle cerebral artery that appeared to have progressed since prior MR angiogram.  MRI of the brain, June 13, 2015 showed progressive nonhemorrhagic infarct involving the right basal ganglia.  Remote anterior right MCA territory infarct involving the insular cortex. Echocardiogram with ejection fraction of 70%, no wall motion abnormalities.  Venous Doppler of the lower extremities negative. Neurology consulted, maintained on Plavix therapy for CVA prophylaxis. She was on a dysphagia #2 thin liquid diet  HPI  Patient returned to home on 07/04/2015. She is independent with dressing she needs help with bathing. She can fix herself a snack. She can ambulate in her home with the left AFO as well as a walker   Pain Inventory Average Pain 6 Pain Right Now 2 My pain is sharp, dull and aching  In the last 24 hours, has pain interfered with the following? General activity 0 Relation with others 0 Enjoyment of life 0 What TIME of day is your pain at its worst? night Sleep (in general) Good  Pain is worse with: unsure Pain improves with: therapy/exercise and medication Relief from Meds: 8  Mobility walk with assistance use a walker ability to climb steps?  yes do you drive?  no use a wheelchair needs help with transfers transfers alone Do you have any goals in this area?  yes  Function not employed: date last employed  NA  Neuro/Psych trouble walking spasms  Prior Studies Any changes since last visit?  no  Physicians involved in your care [facility]   No family history on file. Social History   Social History  . Marital Status: Single    Spouse Name: N/A  . Number of Children: 0  . Years of Education: 12th   Occupational History  . [employer]    Social History Main Topics  . Smoking status: Never Smoker   . Smokeless tobacco: Never Used  . Alcohol Use: 0.0 oz/week    0 Standard drinks or equivalent per week     Comment: 06/09/2013 "might drink a couple times/yr"  . Drug Use: No  . Sexual Activity: Yes   Other Topics Concern  . None   Social History Narrative   Past Surgical History  Procedure Laterality Date  . Ovarian cyst removal  1990's   Past Medical History  Diagnosis Date  . ASTHMA 12/10/2009  . CEREBROVASCULAR ACCIDENT, HX OF 12/10/2009  . HYPERLIPIDEMIA 12/10/2009  . HYPERTENSION 12/10/2009  . Complication of anesthesia     "just can't get me up" (06/09/2013)  . Stroke [facility]) 2008    denies residual on 06/09/2013  . Tendonitis of wrist, right    BP 156/91 mmHg  Pulse 63  Resp 14  SpO2 100%  LMP 07/15/2015  Opioid Risk Score:   Fall Risk Score:  `1  Depression screen PHQ 2/9  Depression screen [facility] 2/9 08/14/2015 08/10/2015 07/31/2015 07/11/2015  Decreased Interest 3 0 0 0  Down,  Depressed, Hopeless 0 0 0 0  PHQ - 2 Score 3 0 0 0  Altered sleeping 1 - - -  Tired, decreased energy 2 - - -  Change in appetite 1 - - -  Feeling bad or failure about yourself  0 - - -  Trouble concentrating 0 - - -  Moving slowly or fidgety/restless 0 - - -  Suicidal thoughts 0 - - -  PHQ-9 Score 7 - - -  Difficult doing work/chores Not difficult at all - - -      Review of Systems  Constitutional: Positive for appetite change.  Respiratory: Positive for cough and wheezing.   Gastrointestinal: Positive for diarrhea.  Musculoskeletal: Positive for  gait problem.  Neurological:       Spasms  All other systems reviewed and are negative.      Objective:   Physical Exam  Constitutional: She is oriented to person, place, and time. She appears well-developed and well-nourished.  HENT:  Head: Normocephalic and atraumatic.  Eyes: Conjunctivae and EOM are normal. Pupils are equal, round, and reactive to light.  Musculoskeletal:       Left shoulder: She exhibits decreased range of motion, deformity and pain. She exhibits no tenderness, no bony tenderness, no swelling and no effusion.  Pain with active external rotation of the left arm as well as abduction and forward flexion. One finger breath subluxation Glenohumeral on the left side  Neurological: She is alert and oriented to person, place, and time.  Motor strength is 5/5 in the right deltoid, biceps, triceps, grip, hip flexors, knee extensor, ankle dorsiflexor 3 minus in the left deltoid, biceps, triceps, grip 3 minus left hip flexor 4 minus left knee extensor 2 minus ankle plantar flexor trace ankle dorsiflexor, 0 toe extensors, trace toe flexors She has one beat of clonus at the ankle on the left side. No evidence of clonus on the right side Patient with intact sensation to light touch in the upper and lower limbs.  Psychiatric: She has a normal mood and affect.  Nursing note and vitals reviewed.         Assessment & Plan:  1. Right MCA infarct with left hemiparesis, recommend outpatient PT and OT. She states she has transportation. Lives in [place]. Has completed inpatient therapy as well as home health therapy. Continues to have problems with her gait as well as left upper extreme functioning. Still is unable to bathe herself  Patient has left foot inversion as well as toe flexor spasticity related to her stroke. She would benefit from Botox to the posterior tibialis as well as Flexor digitorum longus  Patient will follow up in neurology and continue Plavix.  Primary care  follow-up for her other risk factors  2. Left hemiplegic shoulder pain with subluxation. Patient is clinical sign of adhesive capsulitis as well as impingement syndrome  We'll do left subacromial injection, may need to redo glenohumeral injection  Shoulder injection Left  Palpation   guidance  Indication:Left Shoulder pain not relieved by medication management and other conservative care.  Informed consent was obtained after describing risks and benefits of the procedure with the patient, this includes bleeding, bruising, infection and medication side effects. The patient wishes to proceed and has given written consent. Patient was placed in a seated position. The Left  shoulder was marked and prepped with betadine in the subacromial area. A 25-gauge 1-1/2 inch needle was inserted into the subacromial area. After negative draw back for blood, a solution  containing 1 mL of 6 mg per ML betamethasone and 4 mL of 1% lidocaine was injected. A band aid was applied. The patient tolerated the procedure well. Post procedure instructions were given.

## 2015-08-14 NOTE — Patient Instructions (Signed)
Adhesive Capsulitis Adhesive capsulitis is inflammation of the tendons and ligaments that surround the shoulder joint (shoulder capsule). This condition causes the shoulder to become stiff and painful to move. Adhesive capsulitis is also called frozen shoulder. CAUSES This condition may be caused by:  An injury to the shoulder joint.  Straining the shoulder.  Not moving the shoulder for a period of time. This can happen if your arm was injured or in a sling.  Long-standing health problems, such as:  Diabetes.  Thyroid problems.  Heart disease.  Stroke.  Rheumatoid arthritis.  Lung disease. In some cases, the cause may not be known. RISK FACTORS This condition is more likely to develop in:  Women.  People who are older than 48 years of age. SYMPTOMS Symptoms of this condition include:  Pain in the shoulder when moving the arm. There may also be pain when parts of the shoulder are touched. The pain is worse at night or when at rest.  Soreness or aching in the shoulder.  Inability to move the shoulder normally.  Muscle spasms. DIAGNOSIS This condition is diagnosed with a physical exam and imaging tests, such as an X-ray or MRI. TREATMENT This condition may be treated with:  Treatment of the underlying cause or condition.  Physical therapy. This involves performing exercises to get the shoulder moving again.  Medicine. Medicine may be given to relieve pain, inflammation, or muscle spasms.  Steroid injections into the shoulder joint.  Shoulder manipulation. This is a procedure to move the shoulder into another position. It is done after you are given a medicine to make you fall asleep (general anesthetic). The joint may also be injected with salt water at high pressure to break down scarring.  Surgery. This may be done in severe cases when other treatments have failed. Although most people recover completely from adhesive capsulitis, some may not regain the full  movement of the shoulder. HOME CARE INSTRUCTIONS  Take over-the-counter and prescription medicines only as told by your health care provider.  If you are being treated with physical therapy, follow instructions from your physical therapist.  Avoid exercises that put a lot of demand on your shoulder, such as throwing. These exercises can make pain worse.  If directed, apply ice to the injured area:  Put ice in a plastic bag.  Place a towel between your skin and the bag.  Leave the ice on for 20 minutes, 2-3 times per day. SEEK MEDICAL CARE IF:  You develop new symptoms.  Your symptoms get worse.   This information is not intended to replace advice given to you by your health care provider. Make sure you discuss any questions you have with your health care provider.   Document Released: 05/25/2009 Document Revised: 04/18/2015 Document Reviewed: 11/20/2014 Elsevier Interactive Patient Education 2016 Elsevier Inc.  

## 2015-08-17 ENCOUNTER — Telehealth: Payer: Self-pay | Admitting: *Deleted

## 2015-08-17 NOTE — Telephone Encounter (Signed)
Patient verified DOB Patient called to check on the status of disability paperwork. Patient was seen on 08/10/15. Patient informed of office being closed until Jan 3rd and Dr. Adrian Blackwater being on vacation this week. Patient advised to wait for a phone call from the nurse informing her of the paperwork being completed. Patient expressed her understanding and had no further questions.

## 2015-08-21 ENCOUNTER — Encounter: Payer: Self-pay | Admitting: Pharmacist

## 2015-08-21 ENCOUNTER — Telehealth: Payer: Self-pay | Admitting: Family Medicine

## 2015-08-21 ENCOUNTER — Encounter: Payer: Self-pay | Admitting: Family Medicine

## 2015-08-21 NOTE — Telephone Encounter (Signed)
Called patient left VM requesting call back  When she call back please inform her of the following   1. I Cancelled sports medicine appt as I see that  Dr. Dianna Limbo has treated her L shoulder pain.  Going to sports medicine is not needed.   2. Disability paperwork is done. She requested it be faxed but there are some parts of the paperwork that she needs to complete.  Please copy forms and scan what has been done my me. Patient can pick up and complete originals.

## 2015-08-22 NOTE — Telephone Encounter (Signed)
LVM to return call.

## 2015-08-24 ENCOUNTER — Ambulatory Visit: Payer: Self-pay | Admitting: Sports Medicine

## 2015-08-28 ENCOUNTER — Ambulatory Visit: Payer: Medicaid Other | Attending: Physical Medicine & Rehabilitation | Admitting: Occupational Therapy

## 2015-08-28 DIAGNOSIS — G8194 Hemiplegia, unspecified affecting left nondominant side: Secondary | ICD-10-CM | POA: Diagnosis present

## 2015-08-28 DIAGNOSIS — M25612 Stiffness of left shoulder, not elsewhere classified: Secondary | ICD-10-CM | POA: Diagnosis present

## 2015-08-28 DIAGNOSIS — R258 Other abnormal involuntary movements: Secondary | ICD-10-CM | POA: Insufficient documentation

## 2015-08-28 DIAGNOSIS — M25512 Pain in left shoulder: Secondary | ICD-10-CM | POA: Insufficient documentation

## 2015-08-28 DIAGNOSIS — R279 Unspecified lack of coordination: Secondary | ICD-10-CM | POA: Diagnosis present

## 2015-08-28 DIAGNOSIS — R252 Cramp and spasm: Secondary | ICD-10-CM

## 2015-08-28 DIAGNOSIS — R2681 Unsteadiness on feet: Secondary | ICD-10-CM | POA: Diagnosis present

## 2015-08-28 DIAGNOSIS — R278 Other lack of coordination: Secondary | ICD-10-CM

## 2015-08-28 DIAGNOSIS — M2560 Stiffness of unspecified joint, not elsewhere classified: Secondary | ICD-10-CM | POA: Diagnosis present

## 2015-08-28 NOTE — Patient Instructions (Addendum)
Flexion (Passive)    Sitting upright, slide forearm forward along table, bending from the waist until a stretch is felt. Hold 10 seconds. Repeat 10 times. Do 3 sessions per day.  Copyright  VHI. All rights reserved.    Flexion (Assistive)    Clasp hands together and raise arms above head, keeping elbows as straight as possible. Perform lying down  Repeat 10times. Do 3 sessions per day.  Copyright  VHI. All rights reserved.  Flexion (Assistive)

## 2015-08-28 NOTE — Patient Instructions (Signed)
Flexion (Passive)    Sitting upright, slide forearm forward along table, bending from the waist until a stretch is felt. Hold 10 seconds. Repeat 10 times. Do 3 sessions per day.  Copyright  VHI. All rights reserved.    Flexion (Assistive)    Clasp hands together and raise arms above head, keeping elbows as straight as possible. Perform lying down  Repeat 10times. Do 3 sessions per day.  Copyright  VHI. All rights reserved.  Flexion (Assistive)

## 2015-08-29 ENCOUNTER — Ambulatory Visit: Payer: Self-pay | Admitting: Occupational Therapy

## 2015-08-29 NOTE — Therapy (Signed)
[facility] [address] [place], [place], [address] Phone: [phone]   Fax:  [phone]  Occupational Therapy Evaluation  Patient Details  Name: Rebecca Yoder MRN: [id number] Date of Birth: 30-Apr-1966 Referring Provider: Dr. Alysia Penna  Encounter Date: 08/28/2015      OT End of Session - 08/29/15 2121    Visit Number 1   Number of Visits 13   Date for OT Re-Evaluation 10/23/15  11/20/15 if needed   Authorization Type Medicaid pending   Authorization Time Period unable to submit due to Medicaid pending   OT Start Time 0935   OT Stop Time 1015   OT Time Calculation (min) 40 min   Activity Tolerance Patient tolerated treatment well   Behavior During Therapy [facility] for tasks assessed/performed      Past Medical History  Diagnosis Date  . ASTHMA 12/10/2009  . CEREBROVASCULAR ACCIDENT, HX OF 12/10/2009  . HYPERLIPIDEMIA 12/10/2009  . HYPERTENSION 12/10/2009  . Complication of anesthesia     "just can't get me up" (06/09/2013)  . Stroke [facility]) 2008    denies residual on 06/09/2013  . Tendonitis of wrist, right   . Embolic stroke of right basal ganglia ([place]) 06/11/2015    with L residual weakness/hemiparesis     Past Surgical History  Procedure Laterality Date  . Ovarian cyst removal  1990's    There were no vitals filed for this visit.  Visit Diagnosis:  Left hemiparesis ([place])  Shoulder stiffness, left  Joint stiffness  Spasticity  Decreased coordination  Unsteadiness      Subjective Assessment - 08/29/15 2221    Subjective  Pt reports that Dr. Letta Pate recommended PT, OT.  Pt reports that she is Medicaid pending.  Pt reports "It feels so much better" (after exercises).  Pt wants to schedule OT/PT visits 2x/wk.   Pertinent History hx of multiple CVAs (pt reports 06/11/15 CVA was the 4th),was independent prior to 06/11/15 CVA (living alone, taking care of mother who has CA, driving, working, and  using LUE functionally), now living with sister/brother in-law who works a Psychologist, forensic pending; pt reports no significant deficits from first 3 CVAs   Patient Stated Goals improve LUE functional use, incr ADL independence   Currently in Pain? Yes   Pain Score 5    Pain Location Shoulder   Pain Orientation Left   Pain Descriptors / Indicators Sore;Dull   Pain Type Chronic pain   Pain Onset More than a month ago   Pain Frequency Intermittent   Aggravating Factors  raising shoulder   Pain Relieving Factors feels better after stretching           [facility] OT Assessment - 08/29/15 0001    Assessment   Diagnosis L hemiparesis, L frozen shoulder   Referring Provider Dr. Alysia Penna   Onset Date 06/11/15   Prior Therapy in hospital (06/11/15-07/04/15) and home health which ended in 07/2015   Precautions   Precautions Fall   Balance Screen   Has the patient fallen in the past 6 months Yes   How many times? "a couple" last month    Prior Function   Level of Independence Independent with basic ADLs;Independent with community mobility without device   Vocation --  worked in Counselling psychologist Requirements provided care for mother with CA   ADL   Eating/Feeding Modified independent   Grooming --  needs assist for hair, able to brush teeth  Upper Body Bathing Moderate assistance   Lower Body Bathing Moderate assistance   Upper Body Dressing --  mod I   Lower Body Dressing Modified independent   Toilet Tranfer Modified independent   Toileting - Clothing Manipulation Modified independent   Toileting -  Psychologist, sport and exercise seat without back   ADL comments has bedside commode, shower seat, w/c, RW   IADL   Prior Level of Function Shopping independent   Shopping Needs to be accompanied on any shopping trip   Prior Level of Function Light Housekeeping independent    Light Housekeeping --  light tasks from w/c   Prior Level of Function Meal Prep independent   Meal Prep Able to complete simple cold meal and snack prep  microwave   Prior Level of Function Community Mobility independent   Community Mobility --  not able to drive   Medication Management Is responsible for taking medication in correct dosages at correct time   Financial Management --  pt reports performing independently   Mobility   Mobility Status History of falls  Pt ambulates with assistance using RW.   Mobility Status Comments also uses w/c for mobility   Written Expression   Dominant Hand Right   Vision Assessment   Ocular Range of Motion Within Functional Limits   Tracking/Visual Pursuits Able to track stimulus in all quads without difficulty   Visual Fields No apparent deficits   Cognition   Overall Cognitive Status Cognition to be further assessed in functional context PRN  no family available during eval, slower thinking per pt   Sensation   Additional Comments intact per pt   Coordination   Coordination and Movement Description impaired, in synergy pattern, but has difficulty maintaining grasp and coordinating combined movements (wrist/finger etc, for reach and grasp/release).   9 Hole Peg Test Left   Left 9 Hole Peg Test unable   Box and Blocks Unable with LUE   Other Pt unable to use LUE as gross A or stabilizer for unzipping jacket    Coordination Unable to pick up 1-inch object using tip pinch, only able to use gross grasp or lateral pinch with difficulty.  Needs min A to pick up cylinder object and demo decr control.   Tone   Assessment Location Left Upper Extremity   ROM / Strength   AROM / PROM / Strength AROM;PROM   AROM   Overall AROM  Deficits   Overall AROM Comments LUE able to oppose to each digit with decr coordination/control, supination/wrist ext to neutral, gross finger flex/ext WNL, elbow flex/ext WNL, shoulder flex/abduction 90*, ER/IR 50-75% limited  with pain.   PROM   Overall PROM  Deficits   Overall PROM Comments only approx 50% L shoulder flex/ext and 50-75% ER/IR    Hand Function   Left Hand Grip (lbs) 0lbs   Comment Pt reports that she is unable to use LUE functionally/as assist for ADLs   LUE Tone   LUE Tone Modified Ashworth   LUE Tone   Modified Ashworth Scale for Grading Hypertonia LUE Slight increase in muscle tone, manifested by a catch, followed by minimal resistance throughout the remainder (less than half) of the ROM                           OT Short Term Goals - 08/29/15 2202    OT SHORT TERM GOAL #1  Title Pt will be independent with initial HEP.--check STGs 10/09/15   Baseline dependent   Time 6   Period Weeks   Status New   OT SHORT TERM GOAL #2   Title Pt will demo at least 110* L shoulder flexion for functional reaching/ADLs without pain.   Baseline 90*   Time 6   Period Weeks   Status New   OT SHORT TERM GOAL #3   Title Pt will demo ability to use LUE as stabilizer for ADLs.   Baseline unable   Time 6   Period Weeks   Status New   OT SHORT TERM GOAL #4   Title Pt will demo ability to pick up 1inch object using tip pinch in 4/5 trials.   Baseline uses gross grasp or lateral pinch with difficulty   Time 6   Period Weeks   Status New   OT SHORT TERM GOAL #5   Title Pt will be able to grasp/release cylinder object mod I in 4/5 trials.   Baseline min A   Time 6   Period Weeks   Status New           OT Long Term Goals - 08/29/15 2208    OT LONG TERM GOAL #1   Title Pt will be independent with initial HEP.--check STGs 11/20/15   Baseline dependent   Time 12   Period Weeks   Status New   OT LONG TERM GOAL #2   Title Pt will demo at least 120* L shoulder flexion for functional reaching/ADLs without pain.   Baseline 90*   Time 12   Period Weeks   Status New   OT LONG TERM GOAL #3   Title Pt will use LUE as gross assist for ADLs at least 75% of the time.   Baseline  unable   Time 12   Period Weeks   Status New   OT LONG TERM GOAL #4   Title Pt will demo at least 105* L shoulder abduction for ADLs without pain.   Baseline 90*   Time 12   Period Weeks   Status New   OT LONG TERM GOAL #5   Title Pt will improve LUE coordination as shown by scoring at least 5 on box and blocks test.   Baseline unable   Time 12   Period Weeks   Status New   Long Term Additional Goals   Additional Long Term Goals Yes               Plan - 08/29/15 2128    Clinical Impression Statement Pt is a 51 y.o. female s/p CVA 06/11/15 with hospitalization 06/11/15-07/04/15.  Pt completed inpatient rehab and home health therapies and was referred to outpatient rehab for continued deficits and L shoulder pain.  Pt with PMH that includes:  multiple CVAs (x4 with first in 2008), DM, HTN, PFO.  Pt with continued L hemiparesis with diagnosis of frozen shoulder.  Pt completely independent prior to most recent CVA  and was able to use LUE as nondominant assist without difficulty per pt.  Pt presents today with L hemiparesis with decr strength, decr ROM, pain, decr coordination, decr LUE functional use, decr ADL performance, decr independence, and decr functional mobility/balance for ADLs/IADLs.  Pt would benefit from occupational therapy to address these deficits.   Pt will benefit from skilled therapeutic intervention in order to improve on the following deficits (Retired) Decreased mobility;Decreased strength;Impaired UE functional use;Pain;Decreased knowledge of use of DME;Decreased  balance;Impaired tone;Decreased range of motion;Decreased coordination   Rehab Potential Good   Clinical Impairments Affecting Rehab Potential pt reports that sister works long hours, financial concerns (medicaid pending)   OT Frequency 2x / week   OT Duration --  2x week for 12 weeks recommended; however, due to potential Medicaid limitations/financial  concerns, pt may only be seen for 2x week for 12  visits over 12 weeks (+eval)   OT Treatment/Interventions Cryotherapy;Parrafin;Electrical Stimulation;Fluidtherapy;Moist Heat;Passive range of motion;Therapeutic activities;DME and/or AE instruction;Therapeutic exercises;Iontophoresis;Ultrasound;Neuromuscular education;Manual Therapy;Splinting;Patient/family education;Therapist, nutritional;Therapeutic exercise;Self-care/ADL training;Energy conservation;Contrast Bath;Cognitive remediation/compensation;Visual/perceptual remediation/compensation   Plan review and add to HEP   OT Home Exercise Plan Education issued:  initial HEP 08/28/15   Recommended Other Services pt scheduled for PT eval   Consulted and Agree with Plan of Care Patient        Problem List Patient Active Problem List   Diagnosis Date Noted  . Gait disturbance, post-stroke 08/14/2015  . Frozen shoulder syndrome 08/14/2015  . Subacromial impingement of left shoulder 08/14/2015  . Left shoulder pain 08/10/2015  . Diabetes type 2, controlled ([place]) 07/31/2015  . Eczema 07/31/2015  . Right middle cerebral artery stroke ([place]) 06/14/2015  . Left hemiparesis ([place])   . Left-sided neglect   . Embolic stroke of right basal ganglia ([place]) 06/11/2015  . TIA (transient ischemic attack) 06/09/2013  . Hypokalemia 06/09/2013  . PFO (patent foramen ovale) 06/09/2013  . Hyperlipidemia 12/10/2009  . Essential hypertension 12/10/2009  . Asthma 12/10/2009  . CEREBROVASCULAR ACCIDENT, HX OF 12/10/2009    [facility] 08/29/2015, 10:23 PM  [facility] [address] [place], [place], [address] Phone: [phone]   Fax:  [phone]  Name: GISELL NUERNBERGER MRN: [id number] Date of Birth: [birth year]  Vianne Bulls, OTR/L 08/29/2015 10:23 PM

## 2015-08-30 MED FILL — PRAVASTATIN NA 20 MG TAB: 20 | 30 days supply | Qty: 30 | Fill #1

## 2015-09-04 ENCOUNTER — Telehealth: Payer: Self-pay | Admitting: Family Medicine

## 2015-09-04 ENCOUNTER — Encounter: Payer: Self-pay | Admitting: Physical Medicine & Rehabilitation

## 2015-09-04 ENCOUNTER — Ambulatory Visit (HOSPITAL_BASED_OUTPATIENT_CLINIC_OR_DEPARTMENT_OTHER): Payer: Medicaid Other | Admitting: Physical Medicine & Rehabilitation

## 2015-09-04 VITALS — BP 181/92 | HR 62 | Resp 14

## 2015-09-04 DIAGNOSIS — I69954 Hemiplegia and hemiparesis following unspecified cerebrovascular disease affecting left non-dominant side: Secondary | ICD-10-CM | POA: Diagnosis not present

## 2015-09-04 DIAGNOSIS — G8110 Spastic hemiplegia affecting unspecified side: Secondary | ICD-10-CM

## 2015-09-04 NOTE — Progress Notes (Signed)
Botox Injection for spasticity using needle EMG guidance  Dilution: 50 Units/ml Indication: Severe spasticity which interferes with ADL,mobility and/or  hygiene and is unresponsive to medication management and other conservative care Informed consent was obtained after describing risks and benefits of the procedure with the patient. This includes bleeding, bruising, infection, excessive weakness, or medication side effects. A REMS form is on file and signed. Needle: 25g 2" needle electrode Number of units per muscle Tibialis posterior 50 Flexor digitorum longus 50 All injections were done after obtaining appropriate EMG activity and after negative drawback for blood. The patient tolerated the procedure well. Post procedure instructions were given. A followup appointment was made.

## 2015-09-04 NOTE — Telephone Encounter (Signed)
Pt is calling to ask for a refill on Cholesterol and BP medication. I attempted to retrieve the specific names of the medication from the patient however she was unable to tell me. She stated verbatim, "they should know." Please advise. Rebecca Yoder, ASA

## 2015-09-04 NOTE — Patient Instructions (Signed)

## 2015-09-05 ENCOUNTER — Other Ambulatory Visit: Payer: Self-pay | Admitting: *Deleted

## 2015-09-05 MED ORDER — PRAVASTATIN SODIUM 20 MG PO TABS: 20.0000 mg | ORAL_TABLET | Freq: Every day | ORAL | Status: DC

## 2015-09-05 MED FILL — LOSARTAN POTASSIUM 100 MG T: 100 | 30 days supply | Qty: 30 | Fill #1

## 2015-09-05 NOTE — Telephone Encounter (Signed)
Rx Pravastatin refill send to [facility]. All other medication has refills Call pharmacy Pt Notified

## 2015-09-06 ENCOUNTER — Other Ambulatory Visit: Payer: Self-pay | Admitting: Pharmacist

## 2015-09-06 ENCOUNTER — Telehealth: Payer: Self-pay | Admitting: *Deleted

## 2015-09-06 ENCOUNTER — Telehealth: Payer: Self-pay | Admitting: Family Medicine

## 2015-09-06 MED ORDER — CLOPIDOGREL BISULFATE 75 MG PO TABS: 75.0000 mg | ORAL_TABLET | Freq: Every day | ORAL | Status: DC

## 2015-09-06 MED ORDER — PANTOPRAZOLE SODIUM 40 MG PO TBEC: 40.0000 mg | DELAYED_RELEASE_TABLET | Freq: Every day | ORAL | Status: DC

## 2015-09-06 MED FILL — ?PANTOPRAZOLE SOD DR 40MG: 40 MG | 30 days supply | Qty: 30 | Fill #0

## 2015-09-06 MED FILL — CLOPIDOGREL 75 MG TABLET: 75 | 30 days supply | Qty: 30 | Fill #0

## 2015-09-06 NOTE — Telephone Encounter (Signed)
Patient should be referred to Dr. Adrian Blackwater for both of these medications

## 2015-09-06 NOTE — Telephone Encounter (Signed)
Yvone Neu notified Joelene Millin

## 2015-09-06 NOTE — Telephone Encounter (Signed)
Patient called asking for refills on tylenol #3's and losartan.  Are we taking over pain medication management for this patient. No indication in previous notes other than patient is not getting proper relief from medication.  Previous prescriber for this medication was Dr. Adrian Blackwater

## 2015-09-06 NOTE — Telephone Encounter (Signed)
Pt need a refill of  Plavix 75 mg and Tylenol 3  and also please, call her Thank You

## 2015-09-11 ENCOUNTER — Ambulatory Visit: Payer: Medicaid Other | Admitting: Occupational Therapy

## 2015-09-11 DIAGNOSIS — M25612 Stiffness of left shoulder, not elsewhere classified: Secondary | ICD-10-CM

## 2015-09-11 DIAGNOSIS — R252 Cramp and spasm: Secondary | ICD-10-CM

## 2015-09-11 DIAGNOSIS — M25512 Pain in left shoulder: Secondary | ICD-10-CM

## 2015-09-11 DIAGNOSIS — G8194 Hemiplegia, unspecified affecting left nondominant side: Secondary | ICD-10-CM

## 2015-09-11 DIAGNOSIS — M2560 Stiffness of unspecified joint, not elsewhere classified: Secondary | ICD-10-CM

## 2015-09-11 DIAGNOSIS — R279 Unspecified lack of coordination: Secondary | ICD-10-CM

## 2015-09-11 DIAGNOSIS — R278 Other lack of coordination: Secondary | ICD-10-CM

## 2015-09-11 NOTE — Patient Instructions (Signed)
   Lie on back holding wand. Raise arms over head. PAIN FREE RANGE.  Hold 5sec. Repeat 10-15  times per set.  Do 2 sessions per day.   Supine: Chest Press (Active)    Lie on back with arms fully extended. Lower bar or dowel slowly to chest and press to arm's length.  Complete 10-15 repetitions. Perform 2 sessions per day.    Supination (Passive)    Keep elbow bent at right angle and held firmly at side. Use other hand to turn forearm until palm faces upward. Hold 5-10 seconds. Repeat 5 times. Do 2 sessions per day.  AROM: Forearm Pronation / Supination    With right arm in handshake position, slowly rotate palm down until stretch is felt. Relax. Then rotate palm up until stretch is felt. Repeat 10 times per set. Do 2 sessions per day.  KEEP ELBOW NEXT TO YOUR BODY.

## 2015-09-11 NOTE — Therapy (Signed)
[facility] [address] [place], [place], [address] Phone: [phone]   Fax:  [phone]  Occupational Therapy Treatment  Patient Details  Name: Rebecca Yoder MRN: [id number] Date of Birth: October 15, 1965 Referring Provider: Dr. Alysia Penna  Encounter Date: 09/11/2015      OT End of Session - 09/11/15 1031    Visit Number 2   Number of Visits 13   Date for OT Re-Evaluation 10/23/15  11/20/15 if needed   Authorization Type Medicaid pending  (however, pt wants to continue regardless)   Authorization Time Period unable to submit due to Medicaid pending   Authorization - Visit Number 1   Authorization - Number of Visits 12  ? anticipated with medicaid?   OT Start Time 1024   OT Stop Time 1103   OT Time Calculation (min) 39 min   Activity Tolerance Patient tolerated treatment well   Behavior During Therapy WFL for tasks assessed/performed      Past Medical History  Diagnosis Date  . ASTHMA 12/10/2009  . CEREBROVASCULAR ACCIDENT, HX OF 12/10/2009  . HYPERLIPIDEMIA 12/10/2009  . HYPERTENSION 12/10/2009  . Complication of anesthesia     "just can't get me up" (06/09/2013)  . Stroke [facility]) 2008    denies residual on 06/09/2013  . Tendonitis of wrist, right   . Embolic stroke of right basal ganglia ([place]) 06/11/2015    with L residual weakness/hemiparesis     Past Surgical History  Procedure Laterality Date  . Ovarian cyst removal  1990's    There were no vitals filed for this visit.  Visit Diagnosis:  Left hemiparesis ([place])  Shoulder stiffness, left  Joint stiffness  Spasticity  Decreased coordination  Left shoulder pain      Subjective Assessment - 09/11/15 1031    Subjective  Pt reports that she would like to continue OT and that she may know if she is approved for Medicaid in February.   Pertinent History hx of multiple CVAs (pt reports 06/11/15 CVA was the 4th),was independent prior to 06/11/15  CVA (living alone, taking care of mother who has CA, driving, working, and using LUE functionally), now living with sister/brother in-law who works a Psychologist, forensic pending; pt reports no significant deficits from first 3 CVAs   Patient Stated Goals improve LUE functional use, incr ADL independence   Currently in Pain? No/denies             Discussed that pt is still Medicaid pending and unsure of coverage for OT.  Pt reports that she wants to continue OT as she needs it.          OT Treatments/Exercises (OP) - 09/11/15 0001    Neurological Re-education Exercises   Shoulder Flexion AAROM;Seated  UE ranger w/ elbow ext, min faciliation, scapular retraction   Shoulder ABduction AAROM;Left;Seated;Therapy ball  with lateral wt. shift, min facilitation   Seated with weight on hand with mod cues for body on arm movements for incr shoulder stabilization and body on arm movements                OT Education - 09/11/15 1737    Education Details Added to HEP--see pt instruction   Person(s) Educated Patient   Methods Explanation;Verbal cues;Tactile cues;Demonstration;Handout   Comprehension Verbalized understanding;Returned demonstration;Verbal cues required;Tactile cues required          OT Short Term Goals - 08/29/15 2202    OT SHORT TERM GOAL #1  Title Pt will be independent with initial HEP.--check STGs 10/09/15   Baseline dependent   Time 6   Period Weeks   Status New   OT SHORT TERM GOAL #2   Title Pt will demo at least 110* L shoulder flexion for functional reaching/ADLs without pain.   Baseline 90*   Time 6   Period Weeks   Status New   OT SHORT TERM GOAL #3   Title Pt will demo ability to use LUE as stabilizer for ADLs.   Baseline unable   Time 6   Period Weeks   Status New   OT SHORT TERM GOAL #4   Title Pt will demo ability to pick up 1inch object using tip pinch in 4/5 trials.   Baseline uses gross grasp or lateral pinch with  difficulty   Time 6   Period Weeks   Status New   OT SHORT TERM GOAL #5   Title Pt will be able to grasp/release cylinder object mod I in 4/5 trials.   Baseline min A   Time 6   Period Weeks   Status New           OT Long Term Goals - 08/29/15 2208    OT LONG TERM GOAL #1   Title Pt will be independent with initial HEP.--check STGs 11/20/15   Baseline dependent   Time 12   Period Weeks   Status New   OT LONG TERM GOAL #2   Title Pt will demo at least 120* L shoulder flexion for functional reaching/ADLs without pain.   Baseline 90*   Time 12   Period Weeks   Status New   OT LONG TERM GOAL #3   Title Pt will use LUE as gross assist for ADLs at least 75% of the time.   Baseline unable   Time 12   Period Weeks   Status New   OT LONG TERM GOAL #4   Title Pt will demo at least 105* L shoulder abduction for ADLs without pain.   Baseline 90*   Time 12   Period Weeks   Status New   OT LONG TERM GOAL #5   Title Pt will improve LUE coordination as shown by scoring at least 5 on box and blocks test.   Baseline unable   Time 12   Period Weeks   Status New   Long Term Additional Goals   Additional Long Term Goals Yes               Plan - 09/11/15 1746    Clinical Impression Statement Pt need cueing for HEP and is easily distracted in busy gym with min cueing for redirection (with improvement with repetition/cueing)   Plan review HEP, neuro re-ed, LUE functional use   OT Home Exercise Plan Education issued:  initial HEP 08/28/15; additional HEP 09/11/15   Consulted and Agree with Plan of Care Patient        Problem List Patient Active Problem List   Diagnosis Date Noted  . Gait disturbance, post-stroke 08/14/2015  . Frozen shoulder syndrome 08/14/2015  . Subacromial impingement of left shoulder 08/14/2015  . Left shoulder pain 08/10/2015  . Diabetes type 2, controlled ([place]) 07/31/2015  . Eczema 07/31/2015  . Right middle cerebral artery stroke ([place])  06/14/2015  . Left hemiparesis ([place])   . Left-sided neglect   . Embolic stroke of right basal ganglia ([place]) 06/11/2015  . TIA (transient ischemic attack) 06/09/2013  . Hypokalemia 06/09/2013  .  PFO (patent foramen ovale) 06/09/2013  . Hyperlipidemia 12/10/2009  . Essential hypertension 12/10/2009  . Asthma 12/10/2009  . CEREBROVASCULAR ACCIDENT, HX OF 12/10/2009    [facility] 09/11/2015, 5:48 PM  [facility] [address] [place], [place], [address] Phone: [phone]   Fax:  [phone]  Name: Rebecca Yoder MRN: [id number] Date of Birth: 11-09-1965  Vianne Bulls, OTR/L 09/11/2015 5:48 PM

## 2015-09-13 ENCOUNTER — Ambulatory Visit: Payer: Medicaid Other | Attending: Physical Medicine & Rehabilitation | Admitting: Occupational Therapy

## 2015-09-13 DIAGNOSIS — R279 Unspecified lack of coordination: Secondary | ICD-10-CM | POA: Insufficient documentation

## 2015-09-13 DIAGNOSIS — M2560 Stiffness of unspecified joint, not elsewhere classified: Secondary | ICD-10-CM | POA: Diagnosis present

## 2015-09-13 DIAGNOSIS — R2681 Unsteadiness on feet: Secondary | ICD-10-CM | POA: Insufficient documentation

## 2015-09-13 DIAGNOSIS — R258 Other abnormal involuntary movements: Secondary | ICD-10-CM | POA: Insufficient documentation

## 2015-09-13 DIAGNOSIS — M25512 Pain in left shoulder: Secondary | ICD-10-CM | POA: Diagnosis present

## 2015-09-13 DIAGNOSIS — R269 Unspecified abnormalities of gait and mobility: Secondary | ICD-10-CM | POA: Diagnosis present

## 2015-09-13 DIAGNOSIS — R278 Other lack of coordination: Secondary | ICD-10-CM

## 2015-09-13 DIAGNOSIS — G8194 Hemiplegia, unspecified affecting left nondominant side: Secondary | ICD-10-CM | POA: Diagnosis not present

## 2015-09-13 DIAGNOSIS — M25612 Stiffness of left shoulder, not elsewhere classified: Secondary | ICD-10-CM

## 2015-09-13 DIAGNOSIS — R293 Abnormal posture: Secondary | ICD-10-CM | POA: Diagnosis present

## 2015-09-13 DIAGNOSIS — R252 Cramp and spasm: Secondary | ICD-10-CM

## 2015-09-13 DIAGNOSIS — M6281 Muscle weakness (generalized): Secondary | ICD-10-CM | POA: Insufficient documentation

## 2015-09-13 NOTE — Patient Instructions (Signed)
   Wear wrist brace during the day while you try to use left arm.  Remove if any numbness or pain.  Check skin every 2 hours.  While wearing splint, try to pick up something light with both hands (ball, empty shoe box, pillow) and then move it by stretching out your arms.

## 2015-09-13 NOTE — Therapy (Signed)
[facility] [address] [place], [place], [address] Phone: [phone]   Fax:  [phone]  Occupational Therapy Treatment  Patient Details  Name: Rebecca Yoder MRN: [id number] Date of Birth: 03-16-1966 Referring Provider: Dr. Alysia Penna  Encounter Date: 09/13/2015      OT End of Session - 09/13/15 1025    Visit Number 3   Number of Visits 13   Date for OT Re-Evaluation 10/23/15  11/20/15 if needed   Authorization Type Medicaid pending  (however, pt wants to continue regardless)   Authorization Time Period unable to submit due to Medicaid pending   Authorization - Visit Number 2   Authorization - Number of Visits 12  ? anticipated with medicaid?   OT Start Time 1017   OT Stop Time 1100   OT Time Calculation (min) 43 min   Activity Tolerance Patient tolerated treatment well   Behavior During Therapy WFL for tasks assessed/performed      Past Medical History  Diagnosis Date  . ASTHMA 12/10/2009  . CEREBROVASCULAR ACCIDENT, HX OF 12/10/2009  . HYPERLIPIDEMIA 12/10/2009  . HYPERTENSION 12/10/2009  . Complication of anesthesia     "just can't get me up" (06/09/2013)  . Stroke [facility]) 2008    denies residual on 06/09/2013  . Tendonitis of wrist, right   . Embolic stroke of right basal ganglia ([place]) 06/11/2015    with L residual weakness/hemiparesis     Past Surgical History  Procedure Laterality Date  . Ovarian cyst removal  1990's    There were no vitals filed for this visit.  Visit Diagnosis:  Left hemiparesis ([facility])  Shoulder stiffness, left  Spasticity  Decreased coordination  Left shoulder pain      Subjective Assessment - 09/13/15 1024    Subjective  pt reports that she got the call that she was approved for Medicaid  (pt instructed to notify front desk as soon as she has medicaid number)   Pertinent History hx of multiple CVAs (pt reports 06/11/15 CVA was the 4th),was independent prior to  06/11/15 CVA (living alone, taking care of mother who has CA, driving, working, and using LUE functionally), now living with sister/brother in-law who works a Psychologist, forensic pending; pt reports no significant deficits from first 3 CVAs   Patient Stated Goals improve LUE functional use, incr ADL independence   Currently in Pain? No/denies                      OT Treatments/Exercises (OP) - 09/13/15 0001    Neurological Re-education Exercises   Shoulder Flexion AAROM;Self ROM;Supine;15 reps;Both  with min cueing, cane   Elbow Extension AAROM;Supine;15 reps;Both  with min cueing, cane chest press   Other Exercises 1 AAROM shoulder flex/elbow ext with UE ranger with min cueing for attention and for full elbow ext    Other Grasp and Release Exercises  Reaching with BUEs in low range to grasp medium object with min-mod cues for normal movement patterns/neuro re-ed   Seated with weight on hand with mod cues for body on arm movements for incr shoulder stabilization, tricep activation, and body on arm movements   Splinting   Splinting Pt fitted for L wrist cock-up splint for improved positioning with attempted functaional use (deviation)    Pt instructed that decr attention to task and RUE during exercises/RUE functional use is a barrier (pt is easily distracted).  Pt verbalized understanding.  OT Education - 09/13/15 1933    Education Details Added functional reaching BUEs to HEP; splint wear/care   Person(s) Educated Patient   Methods Explanation;Demonstration;Verbal cues;Handout   Comprehension Verbalized understanding;Returned demonstration;Verbal cues required          OT Short Term Goals - 08/29/15 2202    OT SHORT TERM GOAL #1   Title Pt will be independent with initial HEP.--check STGs 10/09/15   Baseline dependent   Time 6   Period Weeks   Status New   OT SHORT TERM GOAL #2   Title Pt will demo at least 110* L shoulder flexion for  functional reaching/ADLs without pain.   Baseline 90*   Time 6   Period Weeks   Status New   OT SHORT TERM GOAL #3   Title Pt will demo ability to use LUE as stabilizer for ADLs.   Baseline unable   Time 6   Period Weeks   Status New   OT SHORT TERM GOAL #4   Title Pt will demo ability to pick up 1inch object using tip pinch in 4/5 trials.   Baseline uses gross grasp or lateral pinch with difficulty   Time 6   Period Weeks   Status New   OT SHORT TERM GOAL #5   Title Pt will be able to grasp/release cylinder object mod I in 4/5 trials.   Baseline min A   Time 6   Period Weeks   Status New           OT Long Term Goals - 08/29/15 2208    OT LONG TERM GOAL #1   Title Pt will be independent with initial HEP.--check STGs 11/20/15   Baseline dependent   Time 12   Period Weeks   Status New   OT LONG TERM GOAL #2   Title Pt will demo at least 120* L shoulder flexion for functional reaching/ADLs without pain.   Baseline 90*   Time 12   Period Weeks   Status New   OT LONG TERM GOAL #3   Title Pt will use LUE as gross assist for ADLs at least 75% of the time.   Baseline unable   Time 12   Period Weeks   Status New   OT LONG TERM GOAL #4   Title Pt will demo at least 105* L shoulder abduction for ADLs without pain.   Baseline 90*   Time 12   Period Weeks   Status New   OT LONG TERM GOAL #5   Title Pt will improve LUE coordination as shown by scoring at least 5 on box and blocks test.   Baseline unable   Time 12   Period Weeks   Status New   Long Term Additional Goals   Additional Long Term Goals Yes               Plan - 09/13/15 1931    Clinical Impression Statement Pt continues to need cueing for distractions.  Pt demo improved functional reach with trying to reach for objects with both arms.   Plan neuro re-ed, LUE functional use    OT Home Exercise Plan Education issued:  initial HEP 08/28/15; additional HEP 09/11/15, 09/13/15 wrist cock up brace and  functional reach [place] and Agree with Plan of Care Patient        Problem List Patient Active Problem List   Diagnosis Date Noted  . Gait disturbance, post-stroke 08/14/2015  . Frozen  shoulder syndrome 08/14/2015  . Subacromial impingement of left shoulder 08/14/2015  . Left shoulder pain 08/10/2015  . Diabetes type 2, controlled ([place]) 07/31/2015  . Eczema 07/31/2015  . Right middle cerebral artery stroke ([place]) 06/14/2015  . Left hemiparesis ([place])   . Left-sided neglect   . Embolic stroke of right basal ganglia ([place]) 06/11/2015  . TIA (transient ischemic attack) 06/09/2013  . Hypokalemia 06/09/2013  . PFO (patent foramen ovale) 06/09/2013  . Hyperlipidemia 12/10/2009  . Essential hypertension 12/10/2009  . Asthma 12/10/2009  . CEREBROVASCULAR ACCIDENT, HX OF 12/10/2009    [facility] 09/13/2015, 7:38 PM  [facility] [address] [place], [place], [address] Phone: [phone]   Fax:  [phone]  Name: Rebecca Yoder MRN: [id number] Date of Birth: [birth year]

## 2015-09-17 ENCOUNTER — Encounter: Payer: Self-pay | Admitting: Occupational Therapy

## 2015-09-17 ENCOUNTER — Ambulatory Visit: Payer: Medicaid Other | Admitting: Occupational Therapy

## 2015-09-17 DIAGNOSIS — M25512 Pain in left shoulder: Secondary | ICD-10-CM

## 2015-09-17 DIAGNOSIS — R278 Other lack of coordination: Secondary | ICD-10-CM

## 2015-09-17 DIAGNOSIS — M25612 Stiffness of left shoulder, not elsewhere classified: Secondary | ICD-10-CM

## 2015-09-17 DIAGNOSIS — R252 Cramp and spasm: Secondary | ICD-10-CM

## 2015-09-17 DIAGNOSIS — G8194 Hemiplegia, unspecified affecting left nondominant side: Secondary | ICD-10-CM

## 2015-09-17 DIAGNOSIS — R279 Unspecified lack of coordination: Secondary | ICD-10-CM

## 2015-09-17 NOTE — Therapy (Signed)
[facility] [address] [place], [place], [address] Phone: [phone]   Fax:  [phone]  Occupational Therapy Treatment  Patient Details  Name: Rebecca Yoder MRN: [id number] Date of Birth: Oct 12, 1965 Referring Provider: Dr. Alysia Penna  Encounter Date: 09/17/2015      OT End of Session - 09/17/15 1009    Visit Number 4   Number of Visits 13   Date for OT Re-Evaluation 10/23/15  11/20/15 if needed   Authorization Type Medicaid pending  (however, pt wants to continue regardless)   Authorization Time Period unable to submit due to Medicaid pending   Authorization - Visit Number 3   Authorization - Number of Visits 12  ? anticipated with medicaid?   OT Start Time 1015   OT Stop Time 1100   OT Time Calculation (min) 45 min   Activity Tolerance Patient tolerated treatment well   Behavior During Therapy WFL for tasks assessed/performed      Past Medical History  Diagnosis Date  . ASTHMA 12/10/2009  . CEREBROVASCULAR ACCIDENT, HX OF 12/10/2009  . HYPERLIPIDEMIA 12/10/2009  . HYPERTENSION 12/10/2009  . Complication of anesthesia     "just can't get me up" (06/09/2013)  . Stroke [facility]) 2008    denies residual on 06/09/2013  . Tendonitis of wrist, right   . Embolic stroke of right basal ganglia ([place]) 06/11/2015    with L residual weakness/hemiparesis     Past Surgical History  Procedure Laterality Date  . Ovarian cyst removal  1990's    There were no vitals filed for this visit.  Visit Diagnosis:  Left hemiparesis ([facility])  Shoulder stiffness, left  Spasticity  Decreased coordination  Left shoulder pain      Subjective Assessment - 09/17/15 1008    Subjective  Pt reports that she feels like she can stretch arm out more.  No problems with splint   Pertinent History hx of multiple CVAs (pt reports 06/11/15 CVA was the 4th),was independent prior to 06/11/15 CVA (living alone, taking care of mother who  has CA, driving, working, and using LUE functionally), now living with sister/brother in-law who works a Psychologist, forensic pending; pt reports no significant deficits from first 3 CVAs   Patient Stated Goals improve LUE functional use, incr ADL independence   Currently in Pain? No/denies  mild at times in L shoulder at home, corrected by repositioning and none during session today                      OT Treatments/Exercises (OP) - 09/17/15 0001    Neurological Re-education Exercises   Shoulder Flexion AAROM;Both;10 reps;Supine  with cane, min cues   Elbow Extension AAROM;Supine;Both;10 reps  chest press with cane, min cues   Other Exercises 1 AAROM shoulder flex/elbow ext with UE ranger with min cueing for attention anf for full elbow ext without compensation.     Other Exercises 2 In sidelying, AAROM shoulder flex with min facilitation at scapula, followed by scapular retraction, ER, horizontal abduction with min-mod facilitation/cues.   Other Grasp and Release Exercises  Reaching with BUEs in low range to grasp object with min-mod cues for normal movement patterns/neuro re-ed, lost balance to the L x2    Seated with weight on hand with min-mod cues for body on arm movements for incr shoulder stabilization, tricep activation.  Pt lost balance to the L x2 with min faciliation to correct, no pain  Prone on elbows with head/chest lift for incr shoulder stability and scapular depression with min cueing.  Then scapular retraction with mod cues with shoulders positioned in ext.            OT Short Term Goals - 08/29/15 2202    OT SHORT TERM GOAL #1   Title Pt will be independent with initial HEP.--check STGs 10/09/15   Baseline dependent   Time 6   Period Weeks   Status New   OT SHORT TERM GOAL #2   Title Pt will demo at least 110* L shoulder flexion for functional reaching/ADLs without pain.   Baseline 90*   Time 6   Period Weeks   Status New   OT  SHORT TERM GOAL #3   Title Pt will demo ability to use LUE as stabilizer for ADLs.   Baseline unable   Time 6   Period Weeks   Status New   OT SHORT TERM GOAL #4   Title Pt will demo ability to pick up 1inch object using tip pinch in 4/5 trials.   Baseline uses gross grasp or lateral pinch with difficulty   Time 6   Period Weeks   Status New   OT SHORT TERM GOAL #5   Title Pt will be able to grasp/release cylinder object mod I in 4/5 trials.   Baseline min A   Time 6   Period Weeks   Status New           OT Long Term Goals - 08/29/15 2208    OT LONG TERM GOAL #1   Title Pt will be independent with initial HEP.--check STGs 11/20/15   Baseline dependent   Time 12   Period Weeks   Status New   OT LONG TERM GOAL #2   Title Pt will demo at least 120* L shoulder flexion for functional reaching/ADLs without pain.   Baseline 90*   Time 12   Period Weeks   Status New   OT LONG TERM GOAL #3   Title Pt will use LUE as gross assist for ADLs at least 75% of the time.   Baseline unable   Time 12   Period Weeks   Status New   OT LONG TERM GOAL #4   Title Pt will demo at least 105* L shoulder abduction for ADLs without pain.   Baseline 90*   Time 12   Period Weeks   Status New   OT LONG TERM GOAL #5   Title Pt will improve LUE coordination as shown by scoring at least 5 on box and blocks test.   Baseline unable   Time 12   Period Weeks   Status New   Long Term Additional Goals   Additional Long Term Goals Yes               Plan - 09/17/15 1107    Clinical Impression Statement Pt with better attention to task today, but continued difficulty with neuro re-ed while talking and needs cues for re-direction.  Pt demo improved LUE control, but loses balance (in sitting) to the L at times when focusing on arm movement and demo difficulty correcting without assist.  No pain reported during session.   Plan neuro re-ed, LUE functional use   OT Home Exercise Plan Education  issued:  initial HEP 08/28/15; additional HEP 09/11/15, 09/13/15 wrist cock up brace and functional reach [place] and Agree with Plan of Care Patient  Problem List Patient Active Problem List   Diagnosis Date Noted  . Gait disturbance, post-stroke 08/14/2015  . Frozen shoulder syndrome 08/14/2015  . Subacromial impingement of left shoulder 08/14/2015  . Left shoulder pain 08/10/2015  . Diabetes type 2, controlled ([place]) 07/31/2015  . Eczema 07/31/2015  . Right middle cerebral artery stroke ([place]) 06/14/2015  . Left hemiparesis ([place])   . Left-sided neglect   . Embolic stroke of right basal ganglia ([place]) 06/11/2015  . TIA (transient ischemic attack) 06/09/2013  . Hypokalemia 06/09/2013  . PFO (patent foramen ovale) 06/09/2013  . Hyperlipidemia 12/10/2009  . Essential hypertension 12/10/2009  . Asthma 12/10/2009  . CEREBROVASCULAR ACCIDENT, HX OF 12/10/2009    [facility] 09/17/2015, 11:12 AM  [facility] [address] [place], [place], [address] Phone: [phone]   Fax:  [phone]  Name: Rebecca Yoder MRN: [id number] Date of Birth: 1965/10/28  Vianne Bulls, OTR/L 09/17/2015 11:12 AM

## 2015-09-18 ENCOUNTER — Other Ambulatory Visit: Payer: Self-pay

## 2015-09-20 ENCOUNTER — Encounter: Payer: Self-pay | Admitting: Occupational Therapy

## 2015-09-25 ENCOUNTER — Other Ambulatory Visit: Payer: Self-pay

## 2015-09-25 ENCOUNTER — Ambulatory Visit: Payer: Medicaid Other | Admitting: *Deleted

## 2015-09-25 DIAGNOSIS — M2560 Stiffness of unspecified joint, not elsewhere classified: Secondary | ICD-10-CM

## 2015-09-25 DIAGNOSIS — R278 Other lack of coordination: Secondary | ICD-10-CM

## 2015-09-25 DIAGNOSIS — R279 Unspecified lack of coordination: Secondary | ICD-10-CM

## 2015-09-25 DIAGNOSIS — R2681 Unsteadiness on feet: Secondary | ICD-10-CM

## 2015-09-25 DIAGNOSIS — G8194 Hemiplegia, unspecified affecting left nondominant side: Secondary | ICD-10-CM | POA: Diagnosis not present

## 2015-09-25 DIAGNOSIS — M25612 Stiffness of left shoulder, not elsewhere classified: Secondary | ICD-10-CM

## 2015-09-25 DIAGNOSIS — R252 Cramp and spasm: Secondary | ICD-10-CM

## 2015-09-25 NOTE — Therapy (Signed)
[facility] [address] [place], [place], [address] Phone: [phone]   Fax:  [phone]  Occupational Therapy Treatment  Patient Details  Name: Rebecca Yoder MRN: [id number] Date of Birth: 14-Sep-1965 Referring Provider: Dr. Alysia Penna  Encounter Date: 09/25/2015      OT End of Session - 09/25/15 1121    Visit Number 5   Number of Visits 13   Date for OT Re-Evaluation 10/23/15  11/20/15 if needed   Authorization Type Medicaid pending  (however, pt wants to continue regardless)   Authorization Time Period unable to submit due to Medicaid pending   Authorization - Visit Number 4   Authorization - Number of Visits 12  ? anticpated with medicaid?   OT Start Time 1020   OT Stop Time 1108   OT Time Calculation (min) 48 min   Activity Tolerance Patient tolerated treatment well   Behavior During Therapy WFL for tasks assessed/performed      Past Medical History  Diagnosis Date  . ASTHMA 12/10/2009  . CEREBROVASCULAR ACCIDENT, HX OF 12/10/2009  . HYPERLIPIDEMIA 12/10/2009  . HYPERTENSION 12/10/2009  . Complication of anesthesia     "just can't get me up" (06/09/2013)  . Stroke [facility]) 2008    denies residual on 06/09/2013  . Tendonitis of wrist, right   . Embolic stroke of right basal ganglia ([place]) 06/11/2015    with L residual weakness/hemiparesis     Past Surgical History  Procedure Laterality Date  . Ovarian cyst removal  1990's    There were no vitals filed for this visit.  Visit Diagnosis:  Left hemiparesis (HCC)  Shoulder stiffness, left  Spasticity  Decreased coordination  Joint stiffness  Unsteadiness      Subjective Assessment - 09/25/15 1113    Subjective  Pt reports she's feeling well. No complaints.    Pertinent History hx of multiple CVAs (pt reports 06/11/15 CVA was the 4th),was independent prior to 06/11/15 CVA (living alone, taking care of mother who has CA, driving, working,  and using LUE functionally), now living with sister/brother in-law who works a lot   Limitations pt reports no significant deficits from first 3 CVAs   Patient Stated Goals improve LUE functional use, incr ADL independence   Currently in Pain? No/denies           Treatment:  Pt with request to use BR prior to start of session, therapist opened door and patient independent from here. Pt propelled self to therapy mat and transferred onto mat, therapist educated pt on technique for making sure w/c is as close to surface as it can be. Pt transferred onto mat with min guard (stand pivot without AD). Focused skilled intervention on neuro re-education to LUE. Pt performed table slides of shoulder flexion/extension and horizontal abduction/adduction X10 each. Then incorporated large peg board into session, with therapist sitting behind patient and peg board in front of patient, pt grabbed peg with L hand and rotated trunk to place peg on therapy mat to her left side. During this, therapist providing tactile cueing to keep shoulder depressed and multimodal cueing needed for shoulder flexion to ensure success during this neuro re-ed activity. Pt then worked on weight bearing through LUE to pick pegs up with right hand and place them back into peg board, also incorporating trunk/core strengthening. From here, pt engaged in additional weight bearing neuro re-ed, performing sit to/from stands weightbearing through bilateral hands mostly. Pt with some complaints of RLE weakness and  stiffness, but no pain during session. Pt eager to have PT evaluation soon.                    OT Education - 09/25/15 1114    Education provided Yes   Education Details importance of decreasing body compensation    Person(s) Educated Patient   Methods Explanation;Demonstration   Comprehension Need further instruction          OT Short Term Goals - 09/25/15 1119    OT SHORT TERM GOAL #1   Title Pt will be  independent with initial HEP.--check STGs 10/09/15   Baseline dependent   Time 6   Period Weeks   Status On-going   OT SHORT TERM GOAL #2   Title Pt will demo at least 110* L shoulder flexion for functional reaching/ADLs without pain.   Baseline 90*   Time 6   Period Weeks   Status On-going   OT SHORT TERM GOAL #3   Title Pt will demo ability to use LUE as stabilizer for ADLs.   Baseline unable   Time 6   Period Weeks   Status On-going   OT SHORT TERM GOAL #4   Title Pt will demo ability to pick up 1inch object using tip pinch in 4/5 trials.   Baseline uses gross grasp or lateral pinch with difficulty   Time 6   Period Weeks   Status On-going   OT SHORT TERM GOAL #5   Title Pt will be able to grasp/release cylinder object mod I in 4/5 trials.   Baseline min A   Time 6   Period Weeks   Status On-going           OT Long Term Goals - 09/25/15 1119    OT LONG TERM GOAL #1   Title Pt will be independent with initial HEP.--check STGs 11/20/15   Baseline dependent   Time 12   Period Weeks   Status On-going   OT LONG TERM GOAL #2   Title Pt will demo at least 120* L shoulder flexion for functional reaching/ADLs without pain.   Baseline 90*   Time 12   Period Weeks   Status On-going   OT LONG TERM GOAL #3   Title Pt will use LUE as gross assist for ADLs at least 75% of the time.   Baseline unable   Time 12   Period Weeks   Status On-going   OT LONG TERM GOAL #4   Title Pt will demo at least 105* L shoulder abduction for ADLs without pain.   Baseline 90*   Time 12   Period Weeks   Status On-going   OT LONG TERM GOAL #5   Title Pt will improve LUE coordination as shown by scoring at least 5 on box and blocks test.   Baseline unable   Time 12   Period Weeks   Status On-going               Plan - 09/25/15 1115    Clinical Impression Statement Pt with improved attention, able to selectively attend to task at hand with minimal verbal cueing. Pt with good  initiation and minimal discussion/talking during this session.    Pt will benefit from skilled therapeutic intervention in order to improve on the following deficits (Retired) Decreased mobility;Decreased strength;Impaired UE functional use;Pain;Decreased knowledge of use of DME;Decreased balance;Impaired tone;Decreased range of motion;Decreased coordination   Rehab Potential Good   Clinical Impairments Affecting Rehab  Potential pt reports that sister works long hours, financial concerns   OT Treatment/Interventions Cryotherapy;Parrafin;Electrical Stimulation;Fluidtherapy;Moist Heat;Passive range of motion;Therapeutic activities;DME and/or AE instruction;Therapeutic exercises;Iontophoresis;Ultrasound;Neuromuscular education;Manual Therapy;Splinting;Patient/family education;Therapist, nutritional;Therapeutic exercise;Self-care/ADL training;Energy conservation;Contrast Bath;Cognitive remediation/compensation;Visual/perceptual remediation/compensation   Plan neuro re-ed and functional use of LUE, continue to work on selective attention during tasks    OT Home Exercise Plan Education issued:  initial HEP 08/28/15; additional HEP 09/11/15, 09/13/15 wrist cock up brace and functional reach [place] and Agree with Plan of Care Patient        Problem List Patient Active Problem List   Diagnosis Date Noted  . Gait disturbance, post-stroke 08/14/2015  . Frozen shoulder syndrome 08/14/2015  . Subacromial impingement of left shoulder 08/14/2015  . Left shoulder pain 08/10/2015  . Diabetes type 2, controlled ([place]) 07/31/2015  . Eczema 07/31/2015  . Right middle cerebral artery stroke ([place]) 06/14/2015  . Left hemiparesis ([place])   . Left-sided neglect   . Embolic stroke of right basal ganglia ([place]) 06/11/2015  . TIA (transient ischemic attack) 06/09/2013  . Hypokalemia 06/09/2013  . PFO (patent foramen ovale) 06/09/2013  . Hyperlipidemia 12/10/2009  . Essential hypertension 12/10/2009  .  Asthma 12/10/2009  . CEREBROVASCULAR ACCIDENT, HX OF 12/10/2009    Chrys Racer , MS, OTR/L, CLT 09/25/2015, 11:22 AM  [facility] [address] [place], [place], [address] Phone: [phone]   Fax:  [phone]  Name: Rebecca Yoder MRN: [id number] Date of Birth: 1965-10-01

## 2015-09-26 ENCOUNTER — Other Ambulatory Visit: Payer: Self-pay

## 2015-09-26 NOTE — Patient Outreach (Signed)
Return incoming telephone call on 09/26/15 from patient to obtain mRS.   mRS = 3

## 2015-09-27 ENCOUNTER — Ambulatory Visit: Payer: PRIVATE HEALTH INSURANCE | Admitting: Neurology

## 2015-09-27 ENCOUNTER — Ambulatory Visit: Payer: Medicaid Other | Admitting: Occupational Therapy

## 2015-09-27 DIAGNOSIS — R278 Other lack of coordination: Secondary | ICD-10-CM

## 2015-09-27 DIAGNOSIS — G8194 Hemiplegia, unspecified affecting left nondominant side: Secondary | ICD-10-CM | POA: Diagnosis not present

## 2015-09-27 DIAGNOSIS — M25612 Stiffness of left shoulder, not elsewhere classified: Secondary | ICD-10-CM

## 2015-09-27 DIAGNOSIS — M25512 Pain in left shoulder: Secondary | ICD-10-CM

## 2015-09-27 DIAGNOSIS — R252 Cramp and spasm: Secondary | ICD-10-CM

## 2015-09-27 DIAGNOSIS — R279 Unspecified lack of coordination: Secondary | ICD-10-CM

## 2015-09-27 NOTE — Therapy (Signed)
[facility] [address] [place], [place], [address] Phone: [phone]   Fax:  [phone]  Occupational Therapy Treatment  Patient Details  Name: Rebecca Yoder MRN: [id number] Date of Birth: Jun 18, 1966 Referring Provider: Dr. Alysia Penna  Encounter Date: 09/27/2015      OT End of Session - 09/27/15 0959    Visit Number 6   Number of Visits 13   Date for OT Re-Evaluation 10/23/15  11/20/15 if needed   Authorization Type Medicaid pending  (however, pt wants to continue regardless)   Authorization Time Period unable to submit due to Medicaid pending   Authorization - Visit Number 4   Authorization - Number of Visits 12  ? anticpated with medicaid?   OT Start Time [phone]   OT Stop Time 1017   OT Time Calculation (min) 41 min   Activity Tolerance Patient tolerated treatment well   Behavior During Therapy [facility] for tasks assessed/performed      Past Medical History  Diagnosis Date  . ASTHMA 12/10/2009  . CEREBROVASCULAR ACCIDENT, HX OF 12/10/2009  . HYPERLIPIDEMIA 12/10/2009  . HYPERTENSION 12/10/2009  . Complication of anesthesia     "just can't get me up" (06/09/2013)  . Stroke [facility]) 2008    denies residual on 06/09/2013  . Tendonitis of wrist, right   . Embolic stroke of right basal ganglia ([place]) 06/11/2015    with L residual weakness/hemiparesis     Past Surgical History  Procedure Laterality Date  . Ovarian cyst removal  1990's    There were no vitals filed for this visit.  Visit Diagnosis:  Left hemiparesis ([place])  Shoulder stiffness, left  Spasticity  Decreased coordination  Left shoulder pain      Subjective Assessment - 09/27/15 1208    Subjective  Pt reports that she recieved Medicaid # and gave it to front desk.  Pt reports that she is using R hand to help stabilize objects some now.   Pertinent History hx of multiple CVAs (pt reports 06/11/15 CVA was the 4th),was independent prior to  06/11/15 CVA (living alone, taking care of mother who has CA, driving, working, and using LUE functionally), now living with sister/brother in-law who works a lot   Limitations pt reports no significant deficits from first 3 CVAs   Patient Stated Goals improve LUE functional use, incr ADL independence   Currently in Pain? No/denies  pain 1x with positioning L upper arm, otherwise no pain (repositioned/discontinued activity)                      OT Treatments/Exercises (OP) - 09/27/15 0001    Neurological Re-education Exercises   Shoulder Flexion AAROM;Both;10 reps;Supine  with PVC frame with    Shoulder ABduction AAROM;Left;Seated  horizontal surface,  min facilitation (trunk/body awareness)   Elbow Extension AAROM;Supine;Both;10 reps  chest press with PVC frame   Other Exercises 1 AAROM shoulder flex with hemiglide with mod cues/facilitation to avoid compensation and for scapular facilitation   Other Exercises 2 In sidelying, attempted AAROM shoulder flex with min facilitation; however, due to pain in L upper arm (?deltoid area), d/c--no other pain reported during session or after activity discontinued   Other Grasp and Release Exercises  Low range functional reaching to grasp/release cylinder objects with min A, Low range functional reaching to grasp/release 1-inch blocks using tip pinch with min cueing.     Other Weight-Bearing Exercises 1 Wt. bearing on elbow, with elbow ext to push  to sit with min-mod facilitation/cues.                OT Education - 09/27/15 1210    Education Details Importance of increasing functional use of LUE for ADLs and instructed in ways to do so; Activities to encourage tip pinch   Person(s) Educated Patient   Methods Explanation   Comprehension Verbalized understanding          OT Short Term Goals - 09/27/15 1003    OT SHORT TERM GOAL #1   Title Pt will be independent with initial HEP.--check STGs 10/09/15   Baseline dependent    Time 6   Period Weeks   Status On-going   OT SHORT TERM GOAL #2   Title Pt will demo at least 110* L shoulder flexion for functional reaching/ADLs without pain.   Baseline 90*   Time 6   Period Weeks   Status On-going   OT SHORT TERM GOAL #3   Title Pt will demo ability to use LUE as stabilizer for ADLs.   Baseline unable   Time 6   Period Weeks   Status On-going   OT SHORT TERM GOAL #4   Title Pt will demo ability to pick up 1inch object using tip pinch in 4/5 trials.   Baseline uses gross grasp or lateral pinch with difficulty   Time 6   Period Weeks   Status On-going   OT SHORT TERM GOAL #5   Title Pt will be able to grasp/release cylinder object mod I in 4/5 trials.   Baseline min A   Time 6   Period Weeks   Status Achieved  09/27/15           OT Long Term Goals - 09/25/15 1119    OT LONG TERM GOAL #1   Title Pt will be independent with initial HEP.--check STGs 11/20/15   Baseline dependent   Time 12   Period Weeks   Status On-going   OT LONG TERM GOAL #2   Title Pt will demo at least 120* L shoulder flexion for functional reaching/ADLs without pain.   Baseline 90*   Time 12   Period Weeks   Status On-going   OT LONG TERM GOAL #3   Title Pt will use LUE as gross assist for ADLs at least 75% of the time.   Baseline unable   Time 12   Period Weeks   Status On-going   OT LONG TERM GOAL #4   Title Pt will demo at least 105* L shoulder abduction for ADLs without pain.   Baseline 90*   Time 12   Period Weeks   Status On-going   OT LONG TERM GOAL #5   Title Pt will improve LUE coordination as shown by scoring at least 5 on box and blocks test.   Baseline unable   Time 12   Period Weeks   Status On-going               Plan - 09/27/15 1255    Clinical Impression Statement Pt demo incr LUE control for low-range functional reaching today and reports incr use of LUE functionally as a stabilizer.   Plan neuro re-ed, functional use of LUE   OT Home  Exercise Plan Education issued:  initial HEP 08/28/15; additional HEP 09/11/15, 09/13/15 wrist cock up brace and functional reach [place] and Agree with Plan of Care Patient        Problem List Patient Active  Problem List   Diagnosis Date Noted  . Gait disturbance, post-stroke 08/14/2015  . Frozen shoulder syndrome 08/14/2015  . Subacromial impingement of left shoulder 08/14/2015  . Left shoulder pain 08/10/2015  . Diabetes type 2, controlled ([place]) 07/31/2015  . Eczema 07/31/2015  . Right middle cerebral artery stroke ([place]) 06/14/2015  . Left hemiparesis ([place])   . Left-sided neglect   . Embolic stroke of right basal ganglia ([place]) 06/11/2015  . TIA (transient ischemic attack) 06/09/2013  . Hypokalemia 06/09/2013  . PFO (patent foramen ovale) 06/09/2013  . Hyperlipidemia 12/10/2009  . Essential hypertension 12/10/2009  . Asthma 12/10/2009  . CEREBROVASCULAR ACCIDENT, HX OF 12/10/2009    [facility] 09/27/2015, 1:01 PM  [facility] [address] [place], [place], [address] Phone: [phone]   Fax:  [phone]  Name: Rebecca Yoder MRN: [id number] Date of Birth: December 06, 1965  Vianne Bulls, OTR/L [facility] [address] [place], [place]  [address] [phone] phone [phone] 09/27/2015 1:02 PM

## 2015-10-01 MED FILL — CLOPIDOGREL 75 MG TABLET: 75 | 30 days supply | Qty: 30 | Fill #1

## 2015-10-01 MED FILL — PRAVASTATIN NA 20 MG TAB: 20 | 30 days supply | Qty: 30 | Fill #0

## 2015-10-01 MED FILL — LOSARTAN POTASSIUM 100 MG T: 100 | 30 days supply | Qty: 30 | Fill #2

## 2015-10-01 MED FILL — PANTOPRAZOLE SOD DR 40 MG T: 40 | 30 days supply | Qty: 30 | Fill #1

## 2015-10-02 ENCOUNTER — Ambulatory Visit: Payer: Medicaid Other | Admitting: Physical Therapy

## 2015-10-02 ENCOUNTER — Ambulatory Visit: Payer: Medicaid Other | Admitting: Occupational Therapy

## 2015-10-02 DIAGNOSIS — G8194 Hemiplegia, unspecified affecting left nondominant side: Secondary | ICD-10-CM

## 2015-10-02 DIAGNOSIS — M25612 Stiffness of left shoulder, not elsewhere classified: Secondary | ICD-10-CM

## 2015-10-02 DIAGNOSIS — M25512 Pain in left shoulder: Secondary | ICD-10-CM

## 2015-10-02 DIAGNOSIS — R278 Other lack of coordination: Secondary | ICD-10-CM

## 2015-10-02 DIAGNOSIS — R279 Unspecified lack of coordination: Secondary | ICD-10-CM

## 2015-10-02 DIAGNOSIS — R252 Cramp and spasm: Secondary | ICD-10-CM

## 2015-10-02 NOTE — Therapy (Signed)
[facility] [address] [place], [place], [address] Phone: [phone]   Fax:  [phone]  Occupational Therapy Treatment  Patient Details  Name: Rebecca Yoder MRN: [id number] Date of Birth: 19-May-1966 Referring Provider: Dr. Alysia Penna  Encounter Date: 10/02/2015      OT End of Session - 10/02/15 0943    Visit Number 7   Number of Visits 13   Date for OT Re-Evaluation 10/23/15  11/20/15 if needed   Authorization Type Medicaid pending  (however, pt wants to continue regardless)   Authorization Time Period unable to submit due to [facility] pending   Authorization - Visit Number 6   Authorization - Number of Visits 12  ? anticpated with medicaid?   OT Start Time [phone]   OT Stop Time 1015  pt had to go to the bathroom during session   OT Time Calculation (min) 38 min   Activity Tolerance Patient tolerated treatment well   Behavior During Therapy [facility] for tasks assessed/performed      Past Medical History  Diagnosis Date  . ASTHMA 12/10/2009  . CEREBROVASCULAR ACCIDENT, HX OF 12/10/2009  . HYPERLIPIDEMIA 12/10/2009  . HYPERTENSION 12/10/2009  . Complication of anesthesia     "just can't get me up" (06/09/2013)  . Stroke [facility]) 2008    denies residual on 06/09/2013  . Tendonitis of wrist, right   . Embolic stroke of right basal ganglia ([place]) 06/11/2015    with L residual weakness/hemiparesis     Past Surgical History  Procedure Laterality Date  . Ovarian cyst removal  1990's    There were no vitals filed for this visit.  Visit Diagnosis:  Left hemiparesis (HCC)  Shoulder stiffness, left  Spasticity  Decreased coordination  Left shoulder pain      Subjective Assessment - 10/02/15 1137    Subjective  Pt reports that she has worked on picking up objects using tip pinch at home and has been doing exercises   Pertinent History hx of multiple CVAs (pt reports 06/11/15 CVA was the 4th),was independent  prior to 06/11/15 CVA (living alone, taking care of mother who has CA, driving, working, and using LUE functionally), now living with sister/brother in-law who works a lot   Limitations pt reports no significant deficits from first 3 CVAs   Patient Stated Goals improve LUE functional use, incr ADL independence   Currently in Pain? No/denies                OT Treatments/Exercises (OP) - 09/27/15 0001    Neurological Re-education Exercises   Shoulder Flexion AAROM;Both;10 reps;Supine  with PVC frame with min faciliation  Then in sidelying AAROM with min-mod facilitation for incr ROM and scapular mobility  Then in sitting with PVC frame with min facilitation/cues for compensation with incr ROM noted without pain.       Elbow Extension AAROM;Supine;Both;10 reps  chest press with PVC frame           Other Grasp and Release Exercises  Low range functional reaching to grasp/release cylinder pegs min-mod difficulty with cueing/attempts to use tip pinch; however, used lateral pinch at times.    Other Weight-Bearing Exercises 1 Wt. bearing through hand on mat with body on arm movements min-mod facilitation/cues.    Then wt. Bearing though BUEs on table in standing with min facilitation/cues, progressing to unweighting RUE for incr LUE wt.bearing and shoulder stabilization.   In sidelying, scapular retraction and depression with min-mod facilitation/cues.  OT Short Term Goals - 10/02/15 1015    OT SHORT TERM GOAL #1   Title Pt will be independent with initial HEP.--check STGs 10/09/15   Baseline dependent   Time 6   Period Weeks   Status Achieved   OT SHORT TERM GOAL #2   Title Pt will demo at least 110* L shoulder flexion for functional reaching/ADLs without pain.   Baseline 90*   Time 6   Period Weeks   Status On-going   OT SHORT TERM GOAL #3   Title Pt will demo ability to use LUE as stabilizer  for ADLs.   Baseline unable   Time 6   Period Weeks   Status On-going   OT SHORT TERM GOAL #4   Title Pt will demo ability to pick up 1inch object using tip pinch in 4/5 trials.   Baseline uses gross grasp or lateral pinch with difficulty   Time 6   Period Weeks   Status On-going   OT SHORT TERM GOAL #5   Title Pt will be able to grasp/release cylinder object mod I in 4/5 trials.   Baseline min A   Time 6   Period Weeks   Status Achieved  09/27/15           OT Long Term Goals - 09/25/15 1119    OT LONG TERM GOAL #1   Title Pt will be independent with initial HEP.--check STGs 11/20/15   Baseline dependent   Time 12   Period Weeks   Status On-going   OT LONG TERM GOAL #2   Title Pt will demo at least 120* L shoulder flexion for functional reaching/ADLs without pain.   Baseline 90*   Time 12   Period Weeks   Status On-going   OT LONG TERM GOAL #3   Title Pt will use LUE as gross assist for ADLs at least 75% of the time.   Baseline unable   Time 12   Period Weeks   Status On-going   OT LONG TERM GOAL #4   Title Pt will demo at least 105* L shoulder abduction for ADLs without pain.   Baseline 90*   Time 12   Period Weeks   Status On-going   OT LONG TERM GOAL #5   Title Pt will improve LUE coordination as shown by scoring at least 5 on box and blocks test.   Baseline unable   Time 12   Period Weeks   Status On-going               Plan - 10/02/15 1016    Clinical Impression Statement Pt continues to progress towards goals with improved grasp/release of objects and incr AAROM by end of session.   Plan neuro re-ed, functional use of LUE        Problem List Patient Active Problem List   Diagnosis Date Noted  . Gait disturbance, post-stroke 08/14/2015  . Frozen shoulder syndrome 08/14/2015  . Subacromial impingement of left shoulder 08/14/2015  . Left shoulder pain 08/10/2015  . Diabetes type 2, controlled ([place]) 07/31/2015  . Eczema 07/31/2015  .  Right middle cerebral artery stroke ([place]) 06/14/2015  . Left hemiparesis ([place])   . Left-sided neglect   . Embolic stroke of right basal ganglia ([place]) 06/11/2015  . TIA (transient ischemic attack) 06/09/2013  . Hypokalemia 06/09/2013  . PFO (patent foramen ovale) 06/09/2013  . Hyperlipidemia 12/10/2009  . Essential hypertension 12/10/2009  . Asthma 12/10/2009  . CEREBROVASCULAR ACCIDENT,  HX OF 12/10/2009    [facility] 10/02/2015, 1:51 PM  [facility] [address] [place], [place], [address] Phone: [phone]   Fax:  [phone]  Name: AZUREE TEAL MRN: [id number] Date of Birth: Sep 30, 1965  Vianne Bulls, OTR/L [facility] [address] [place], [place]  [address] [phone] phone [phone] 10/02/2015 1:51 PM

## 2015-10-03 ENCOUNTER — Telehealth: Payer: Self-pay | Admitting: Family Medicine

## 2015-10-03 ENCOUNTER — Telehealth: Payer: Self-pay | Admitting: Neurology

## 2015-10-03 NOTE — Telephone Encounter (Signed)
Pt called and says she is having a bad migraine. Has lasted 3 days. Left and right side of head is affected. She is requesting Tylenol 3. Please call and advise

## 2015-10-03 NOTE — Telephone Encounter (Signed)
Rn call patient about her headache. Pt saw Dr.Sethi for stroke follow up. Rn explain Dr.Sethi does not do chronic pain. Pt already has two orders for tylenol by another physician. Rn explain she can contact her PCP, or the MD for a different type of medication. Pt verbalized understanding. Rn advise patient to call her PCP about her headache.

## 2015-10-03 NOTE — Telephone Encounter (Signed)
Pt. Is having slight headaches for about a couple of days. Pt. States she had a stroke in October and could possibly result of that Please advise patient

## 2015-10-04 ENCOUNTER — Ambulatory Visit: Payer: Medicaid Other | Admitting: Occupational Therapy

## 2015-10-04 DIAGNOSIS — G8194 Hemiplegia, unspecified affecting left nondominant side: Secondary | ICD-10-CM | POA: Diagnosis not present

## 2015-10-04 DIAGNOSIS — M25612 Stiffness of left shoulder, not elsewhere classified: Secondary | ICD-10-CM

## 2015-10-04 DIAGNOSIS — M2560 Stiffness of unspecified joint, not elsewhere classified: Secondary | ICD-10-CM

## 2015-10-04 DIAGNOSIS — R278 Other lack of coordination: Secondary | ICD-10-CM

## 2015-10-04 DIAGNOSIS — M25512 Pain in left shoulder: Secondary | ICD-10-CM

## 2015-10-04 DIAGNOSIS — R279 Unspecified lack of coordination: Secondary | ICD-10-CM

## 2015-10-04 DIAGNOSIS — R252 Cramp and spasm: Secondary | ICD-10-CM

## 2015-10-04 NOTE — Therapy (Signed)
[facility] [address] [place], [place], [address] Phone: [phone]   Fax:  [phone]  Occupational Therapy Treatment  Patient Details  Name: Rebecca Yoder MRN: [id number] Date of Birth: 02-18-1966 Referring Provider: Dr. Alysia Penna  Encounter Date: 10/04/2015      OT End of Session - 10/04/15 0938    Visit Number 8   Number of Visits 13   Date for OT Re-Evaluation 10/23/15  11/20/15 if needed   Authorization Type Medicaid pending  (however, pt wants to continue regardless)   Authorization Time Period unable to submit due to Medicaid pending   Authorization - Visit Number 7   Authorization - Number of Visits 12  ? anticpated with medicaid?   OT Start Time [phone]   OT Stop Time 1015   OT Time Calculation (min) 38 min   Activity Tolerance Patient tolerated treatment well   Behavior During Therapy [facility] for tasks assessed/performed      Past Medical History  Diagnosis Date  . ASTHMA 12/10/2009  . CEREBROVASCULAR ACCIDENT, HX OF 12/10/2009  . HYPERLIPIDEMIA 12/10/2009  . HYPERTENSION 12/10/2009  . Complication of anesthesia     "just can't get me up" (06/09/2013)  . Stroke [facility]) 2008    denies residual on 06/09/2013  . Tendonitis of wrist, right   . Embolic stroke of right basal ganglia ([place]) 06/11/2015    with L residual weakness/hemiparesis     Past Surgical History  Procedure Laterality Date  . Ovarian cyst removal  1990's    There were no vitals filed for this visit.  Visit Diagnosis:  Left hemiparesis (HCC)  Shoulder stiffness, left  Spasticity  Decreased coordination  Left shoulder pain  Joint stiffness      Subjective Assessment - 10/04/15 1255    Subjective  "I can tell my arm is getting better"   Pertinent History hx of multiple CVAs (pt reports 06/11/15 CVA was the 4th),was independent prior to 06/11/15 CVA (living alone, taking care of mother who has CA, driving, working, and  using LUE functionally), now living with sister/brother in-law who works a lot   Limitations pt reports no significant deficits from first 3 CVAs   Patient Stated Goals improve LUE functional use, incr ADL independence   Currently in Pain? Yes   Pain Score 5    Pain Location Shoulder   Pain Orientation Left   Pain Descriptors / Indicators Dull;Cramping   Pain Type Chronic pain   Pain Frequency Intermittent   Aggravating Factors  after exercise or if pt does not pay attention to positioning   Pain Relieving Factors improper positioning            Neurological Re-education Exercises    Shoulder Flexion AAROM;Both;10 reps;Supine  with PVC frame with min faciliation  Then in sitting with PVC frame with min facilitation for scapula/elbow and cues with incr ROM noted without pain.       Elbow Extension AAROM;Supine;Both;10 reps  chest press with PVC frame           Other Grasp and Release Exercises  Low range functional reaching to grasp/release cylinder pegs min difficulty and then 1-inch blocks with cueing to use tip pinch with min difficulty--incr consistency today.              shoulder flex to 100* with min compensation/IR but no pain  Checked STGs and discussed progress.  OT Education - 10/04/15 1256    Education Details Ways to incr LUE functional use at home (stabilizer when zipping/unzipping jacket, stabilizer for toothbrush when putting on toothpaste, hand-over-hand activities, washing legs, folding towels, etc).   Person(s) Educated Patient   Methods Explanation;Demonstration;Verbal cues   Comprehension Verbalized understanding;Returned demonstration;Verbal cues required          OT Short Term Goals - 10/04/15 0958    OT SHORT TERM GOAL #1   Title Pt will be independent with initial HEP.--check STGs 10/09/15   Baseline dependent   Time 6   Period Weeks   Status Achieved   OT SHORT TERM GOAL  #2   Title Pt will demo at least 110* L shoulder flexion for functional reaching/ADLs without pain.   Baseline 90*   Time 6   Period Weeks   Status On-going  10/04/15:  100* with no pain, but min compensation/IR   OT SHORT TERM GOAL #3   Title Pt will demo ability to use LUE as stabilizer for ADLs.   Baseline unable   Time 6   Period Weeks   Status On-going  10/04/15:  inconsistent   OT SHORT TERM GOAL #4   Title Pt will demo ability to pick up 1inch object using tip pinch in 4/5 trials.   Baseline uses gross grasp or lateral pinch with difficulty   Time 6   Period Weeks   Status Achieved  10/04/15 met   OT SHORT TERM GOAL #5   Title Pt will be able to grasp/release cylinder object mod I in 4/5 trials.   Baseline min A   Time 6   Period Weeks   Status Achieved  09/27/15           OT Long Term Goals - 09/25/15 1119    OT LONG TERM GOAL #1   Title Pt will be independent with initial HEP.--check STGs 11/20/15   Baseline dependent   Time 12   Period Weeks   Status On-going   OT LONG TERM GOAL #2   Title Pt will demo at least 120* L shoulder flexion for functional reaching/ADLs without pain.   Baseline 90*   Time 12   Period Weeks   Status On-going   OT LONG TERM GOAL #3   Title Pt will use LUE as gross assist for ADLs at least 75% of the time.   Baseline unable   Time 12   Period Weeks   Status On-going   OT LONG TERM GOAL #4   Title Pt will demo at least 105* L shoulder abduction for ADLs without pain.   Baseline 90*   Time 12   Period Weeks   Status On-going   OT LONG TERM GOAL #5   Title Pt will improve LUE coordination as shown by scoring at least 5 on box and blocks test.   Baseline unable   Time 12   Period Weeks   Status On-going               Plan - 10/04/15 1252    Clinical Impression Statement Pt is progressing with improved LUE functional use and shoulder ROM without pain.   Plan neuro re-ed, functional use of LUE   OT Home Exercise Plan  Education issued:  initial HEP 08/28/15; additional HEP 09/11/15, 09/13/15 wrist cock up brace and functional reach [place] and Agree with Plan of Care Patient        Problem List Patient Active Problem  List   Diagnosis Date Noted  . Gait disturbance, post-stroke 08/14/2015  . Frozen shoulder syndrome 08/14/2015  . Subacromial impingement of left shoulder 08/14/2015  . Left shoulder pain 08/10/2015  . Diabetes type 2, controlled ([place]) 07/31/2015  . Eczema 07/31/2015  . Right middle cerebral artery stroke ([place]) 06/14/2015  . Left hemiparesis ([place])   . Left-sided neglect   . Embolic stroke of right basal ganglia ([place]) 06/11/2015  . TIA (transient ischemic attack) 06/09/2013  . Hypokalemia 06/09/2013  . PFO (patent foramen ovale) 06/09/2013  . Hyperlipidemia 12/10/2009  . Essential hypertension 12/10/2009  . Asthma 12/10/2009  . CEREBROVASCULAR ACCIDENT, HX OF 12/10/2009    [facility] 10/04/2015, 12:59 PM  [facility] [address] [place], [place], [address] Phone: [phone]   Fax:  [phone]  Name: AYANA IMHOF MRN: [id number] Date of Birth: 1965/12/08  Vianne Bulls, OTR/L [facility] [address] [place], [place]  [address] [phone] phone [phone] 10/04/2015 1:00 PM

## 2015-10-04 NOTE — Telephone Encounter (Signed)
Pt stated HA on and off x 3 days  OTC Tylenol not helping  Advice to go to UC if worsen  Schedule F/U appointment with PCP

## 2015-10-05 ENCOUNTER — Ambulatory Visit: Payer: Medicaid Other | Admitting: Physical Therapy

## 2015-10-05 DIAGNOSIS — R252 Cramp and spasm: Secondary | ICD-10-CM

## 2015-10-05 DIAGNOSIS — R293 Abnormal posture: Secondary | ICD-10-CM

## 2015-10-05 DIAGNOSIS — R269 Unspecified abnormalities of gait and mobility: Secondary | ICD-10-CM

## 2015-10-05 DIAGNOSIS — G8194 Hemiplegia, unspecified affecting left nondominant side: Secondary | ICD-10-CM | POA: Diagnosis not present

## 2015-10-05 DIAGNOSIS — M6281 Muscle weakness (generalized): Secondary | ICD-10-CM

## 2015-10-05 NOTE — Therapy (Signed)
[facility] [address] [place], [place], [address] Phone: [phone]   Fax:  [phone]  Physical Therapy Evaluation  Patient Details  Name: Rebecca Yoder MRN: [id number] Date of Birth: [birth year] Referring Provider: Letta Pate  Encounter Date: 10/05/2015      PT End of Session - 10/05/15 1154    Visit Number 1   Number of Visits 13   Date for PT Re-Evaluation 12/05/15   Authorization Type Medicaid-12 visits requested   PT Start Time 1108   PT Stop Time 1148   PT Time Calculation (min) 40 min   Activity Tolerance Patient tolerated treatment well   Behavior During Therapy [facility] for tasks assessed/performed      Past Medical History  Diagnosis Date  . ASTHMA 12/10/2009  . CEREBROVASCULAR ACCIDENT, HX OF 12/10/2009  . HYPERLIPIDEMIA 12/10/2009  . HYPERTENSION 12/10/2009  . Complication of anesthesia     "just can't get me up" (06/09/2013)  . Stroke [facility]) 2008    denies residual on 06/09/2013  . Tendonitis of wrist, right   . Embolic stroke of right basal ganglia ([place]) 06/11/2015    with L residual weakness/hemiparesis     Past Surgical History  Procedure Laterality Date  . Ovarian cyst removal  1990's    There were no vitals filed for this visit.  Visit Diagnosis:  Muscle weakness of lower extremity  Left hemiparesis (HCC)  Spasticity  Abnormality of gait  Postural instability      Subjective Assessment - 10/05/15 1111    Subjective Pt is a 50 year old female who presents to OP PT status post CVA 06/11/15.  She has L hemiparesis.  She notes difficulty with walking and weakness.  Prior to CVA, she was independent, working, driving.  Pt ambulates with RW, has a wheelchair, has an AFO.  She had Botox in LLE approximately 5-6 weeks ago.   Patient Stated Goals Pt's goal for therapy is to walk better and use hand better.   Currently in Pain? No/denies            [facility] PT Assessment - 10/05/15 1114     Assessment   Medical Diagnosis CVA with L hemiparesis   Referring Provider Kirsteins   Onset Date/Surgical Date 06/11/15   Precautions   Precautions Fall   Balance Screen   Has the patient fallen in the past 6 months Yes   How many times? 3   Has the patient had a decrease in activity level because of a fear of falling?  No   Is the patient reluctant to leave their home because of a fear of falling?  No   Home [employer] residence   Living Arrangements --  Lives with sister and her family   Available Help at Discharge Family   Type of [place] to enter   Entrance Stairs-Number of Steps [place] One level   [place] - 2 wheels;Wheelchair - manual;Hospital bed;Bedside commode;Shower seat   Prior Function   Level of Independence Independent with basic ADLs;Independent with household mobility without device;Independent with community mobility without device   Vocation On disability   ROM / Strength   AROM / PROM / Strength Strength   AROM   Overall AROM Comments unable to fully isolate full knee extension LLE due to increase in tone   Strength   Overall Strength Comments In  sitting MMT position, pt has increased posterior lean and posterior pelvic tilt during MMT, indicating decreased trunk strength   Strength Assessment Site Hip;Knee;Ankle   Right/Left Hip Right;Left   Right Hip Flexion 4/5   Left Hip Flexion 3/5   Right/Left Knee Right;Left   Right Knee Flexion 4/5   Right Knee Extension 4/5   Left Knee Flexion 3-/5   Left Knee Extension 2/5   Right/Left Ankle Right;Left   Right Ankle Dorsiflexion 5/5   Left Ankle Dorsiflexion 1/5   Ambulation/Gait   Ambulation/Gait Yes   Ambulation/Gait Assistance 5: Supervision   Ambulation Distance (Feet) 60 Feet   Assistive device Rolling walker   Gait Pattern Step-to pattern;Decreased step length - right;Decreased step  length - left;Decreased stance time - left;Decreased hip/knee flexion - left;Decreased dorsiflexion - left;Decreased weight shift to left;Left foot flat;Trunk rotated posteriorly on left;Poor foot clearance - left  incr. inversion LLE   Ambulation Surface Level;Indoor   Gait velocity 38.70 sec= 0.85 ft/sec   Standardized Balance Assessment   Standardized Balance Assessment Timed Up and Go Test;Berg Balance Test   Berg Balance Test   Sit to Stand Able to stand using hands after several tries   Standing Unsupported Able to stand 2 minutes with supervision   Sitting with Back Unsupported but Feet Supported on Floor or Stool Able to sit safely and securely 2 minutes   Stand to Sit Controls descent by using hands   Transfers Able to transfer with verbal cueing and /or supervision   Standing Unsupported with Eyes Closed Able to stand 10 seconds with supervision   Standing Ubsupported with Feet Together Needs help to attain position but able to stand for 30 seconds with feet together   From Standing, Reach Forward with Outstretched Arm Can reach forward >5 cm safely (2")  4"   From Standing Position, Pick up Object from Floor Unable to try/needs assist to keep balance   From Standing Position, Turn to Look Behind Over each Shoulder Needs supervision when turning   Turn 360 Degrees Needs assistance while turning   Standing Unsupported, Alternately Place Feet on Step/Stool Needs assistance to keep from falling or unable to try   Standing Unsupported, One Foot in Front Needs help to step but can hold 15 seconds   Standing on One Leg Unable to try or needs assist to prevent fall   Total Score 22   Berg comment: Scores <45/56 on Berg indicate increased fall risk.   Timed Up and Go Test   Normal TUG (seconds) 44.5                             PT Short Term Goals - 10/05/15 1203    PT SHORT TERM GOAL #1   Title Pt will be able to perform HEP with family supervision for improved  balance, strength, gait.  TARGET 12/03/15   Baseline No current HEP for lower extremity strength/balance   Time 4   Period Weeks   Status New   PT SHORT TERM GOAL #2   Title Pt will perform at least 6 of 10 reps of sit<>stand transfers with minimal UE support for improved efficiency and safety with gait.   Baseline Supervision for transfers with definite UE support   Time 4   Period Weeks   Status New   PT SHORT TERM GOAL #3   Title Pt will improve TUG score to less than or equal to 38  seconds for decreased fall risk.   Baseline TUG 44.50 sec with RW   Time 4   Period Weeks   Status New   PT SHORT TERM GOAL #4   Title Pt will improve Berg Balance score to at least 27/56 for decreased fall risk.   Baseline Berg scores 22/56 (scores <45/56 indicate increased fall risk.)   Time 4   Period Weeks   Status New           PT Long Term Goals - 2015/10/28 Dec 03, 1205    PT LONG TERM GOAL #1   Title Pt will verbalize understanding of fall prevention within the home environment.  TARGET 12/05/15   Baseline at fall risk per Merrilee Jansky, TUG scores   Time 8   Period Weeks   Status New   PT LONG TERM GOAL #2   Title Pt will improve gait velocity to at least 1.2 ft/sec for improved gait efficiency and safety.   Baseline gait velocity 0.85 ft/sec   Time 8   Period Weeks   Status New   PT LONG TERM GOAL #3   Title Pt will improve TUG score to less than or equal to 30 seconds for decreased fall risk.   Baseline TUG 44.50 sec   Time 8   Period Weeks   Status New   PT LONG TERM GOAL #4   Title Pt will improve Berg Balance score to at least 32/56 for decreased fall risk.   Baseline Berg score 22/56   Time 8   Period Weeks   Status New   PT LONG TERM GOAL #5   Title Pt will verbalize understanding of community fitness upon D/C from PT.   Baseline no current formal HEP/community fitness   Time 8   Period Weeks   Status New               Plan - Oct 28, 2015 1156    Clinical Impression  Statement Pt is a 51 year old female with history of R MCA CVA on 06/11/15, with hospitalization 06/11/15-07/04/15.  Pt has diagnosis of hemiplegia affecting non-dominant side (438.2).  Pt presents with decreased strength, decreased full A/ROM, increased LLE tone, decreased balance, abnormality of gait.  Pt presents at fall risk per [facility] and TUG scores.  Pt demonstrates decreased gait speed of 0.85 ft/sec with RW, which places pt at fall risk and limited household ambulator.  Pt presents with at least 5 co-morbidities, including previous history of CVA.  Pt is no longer able to work, drive since CVA.  Pt would benefit from skilled PT services to address the above stated deficits to improve functional mobility and decrease fall risk.   Pt will benefit from skilled therapeutic intervention in order to improve on the following deficits Abnormal gait;Decreased balance;Decreased mobility;Decreased range of motion;Decreased strength;Difficulty walking;Impaired tone;Pain;Postural dysfunction   Rehab Potential Good   PT Frequency --  12 visits over 12 weeks   PT Duration 12 weeks   PT Treatment/Interventions ADLs/Self Care Home Management;Electrical Stimulation;Functional mobility training;Therapeutic activities;Therapeutic exercise;Gait training;DME Instruction;Balance training;Neuromuscular re-education;Patient/family education;Orthotic Fit/Training   PT Next Visit Plan Initiate HEP for LLE strengthening, increased weigthshifting/weightbearing on LLE during transfers; work on gait training to improve step through pattern with gait using RW   Consulted and Agree with Plan of Care Patient          G-Codes - 10/28/2015 1210-12-04    Functional Assessment Tool Used Gait velocity 0.85 ft/sec, Berg 22/56, TUG 44.50 sec with RW  Functional Limitation Mobility: Walking and moving around   Mobility: Walking and Moving Around Current Status [phone]) At least 60 percent but less than 80 percent impaired, limited or  restricted   Mobility: Walking and Moving Around Goal Status [phone]) At least 20 percent but less than 40 percent impaired, limited or restricted       Problem List Patient Active Problem List   Diagnosis Date Noted  . Gait disturbance, post-stroke 08/14/2015  . Frozen shoulder syndrome 08/14/2015  . Subacromial impingement of left shoulder 08/14/2015  . Left shoulder pain 08/10/2015  . Diabetes type 2, controlled ([place]) 07/31/2015  . Eczema 07/31/2015  . Right middle cerebral artery stroke ([place]) 06/14/2015  . Left hemiparesis ([place])   . Left-sided neglect   . Embolic stroke of right basal ganglia ([place]) 06/11/2015  . TIA (transient ischemic attack) 06/09/2013  . Hypokalemia 06/09/2013  . PFO (patent foramen ovale) 06/09/2013  . Hyperlipidemia 12/10/2009  . Essential hypertension 12/10/2009  . Asthma 12/10/2009  . CEREBROVASCULAR ACCIDENT, HX OF 12/10/2009    Frazier Butt. 10/05/2015, 12:13 PM  Frazier Butt., PT [facility] [address] [place], [place], [address] Phone: [phone]   Fax:  [phone]  Name: Rebecca Yoder MRN: [id number] Date of Birth: 1966/08/10

## 2015-10-09 ENCOUNTER — Encounter: Payer: Self-pay | Admitting: Occupational Therapy

## 2015-10-11 ENCOUNTER — Ambulatory Visit: Payer: Medicaid Other | Attending: Physical Medicine & Rehabilitation | Admitting: Occupational Therapy

## 2015-10-11 DIAGNOSIS — M25512 Pain in left shoulder: Secondary | ICD-10-CM | POA: Diagnosis present

## 2015-10-11 DIAGNOSIS — M2560 Stiffness of unspecified joint, not elsewhere classified: Secondary | ICD-10-CM | POA: Diagnosis present

## 2015-10-11 DIAGNOSIS — M6281 Muscle weakness (generalized): Secondary | ICD-10-CM | POA: Insufficient documentation

## 2015-10-11 DIAGNOSIS — R293 Abnormal posture: Secondary | ICD-10-CM | POA: Diagnosis present

## 2015-10-11 DIAGNOSIS — M25612 Stiffness of left shoulder, not elsewhere classified: Secondary | ICD-10-CM

## 2015-10-11 DIAGNOSIS — R279 Unspecified lack of coordination: Secondary | ICD-10-CM | POA: Insufficient documentation

## 2015-10-11 DIAGNOSIS — R269 Unspecified abnormalities of gait and mobility: Secondary | ICD-10-CM | POA: Diagnosis present

## 2015-10-11 DIAGNOSIS — R2681 Unsteadiness on feet: Secondary | ICD-10-CM | POA: Diagnosis present

## 2015-10-11 DIAGNOSIS — G8194 Hemiplegia, unspecified affecting left nondominant side: Secondary | ICD-10-CM | POA: Diagnosis not present

## 2015-10-11 DIAGNOSIS — R258 Other abnormal involuntary movements: Secondary | ICD-10-CM | POA: Insufficient documentation

## 2015-10-11 DIAGNOSIS — R278 Other lack of coordination: Secondary | ICD-10-CM

## 2015-10-11 NOTE — Therapy (Signed)
[facility] [address] [place], [place], [address] Phone: [phone]   Fax:  [phone]  Occupational Therapy Treatment  Patient Details  Name: Rebecca Yoder MRN: [id number] Date of Birth: Sep 20, 1965 Referring Provider: Dr. Alysia Penna  Encounter Date: 10/11/2015      OT End of Session - 10/11/15 1644    Visit Number 9   Number of Visits 13   Date for OT Re-Evaluation 10/23/15   Authorization Type Medicaid pending  (however, pt wants to continue regardless)   Authorization - Visit Number 8   Authorization - Number of Visits 12   OT Start Time 1657   OT Stop Time 1445   OT Time Calculation (min) 42 min   Activity Tolerance Patient tolerated treatment well   Behavior During Therapy [facility] for tasks assessed/performed      Past Medical History  Diagnosis Date  . ASTHMA 12/10/2009  . CEREBROVASCULAR ACCIDENT, HX OF 12/10/2009  . HYPERLIPIDEMIA 12/10/2009  . HYPERTENSION 12/10/2009  . Complication of anesthesia     "just can't get me up" (06/09/2013)  . Stroke [facility]) 2008    denies residual on 06/09/2013  . Tendonitis of wrist, right   . Embolic stroke of right basal ganglia ([place]) 06/11/2015    with L residual weakness/hemiparesis     Past Surgical History  Procedure Laterality Date  . Ovarian cyst removal  1990's    There were no vitals filed for this visit.  Visit Diagnosis:  Left hemiparesis (HCC)  Shoulder stiffness, left  Decreased coordination  Left shoulder pain      Subjective Assessment - 10/11/15 1406    Subjective  Pt reports pain with reaching   Pertinent History hx of multiple CVAs (pt reports 06/11/15 CVA was the 4th),was independent prior to 06/11/15 CVA (living alone, taking care of mother who has CA, driving, working, and using LUE functionally), now living with sister/brother in-law who works a lot   Limitations pt reports no significant deficits from first 3 CVAs   Patient  Stated Goals improve LUE functional use, incr ADL independence   Currently in Pain? Yes   Pain Score 7    Pain Location Shoulder   Pain Orientation Left   Pain Descriptors / Indicators Aching   Pain Onset More than a month ago   Pain Frequency Intermittent   Aggravating Factors  stretching/ malpositioning   Pain Relieving Factors improper positiioning              Neurological Re-education Exercises           Shoulder Flexion    AAROM;Both;10 reps;Supine    with PVC frame with min faciliation Chest press, shoulder flexion Then in sitting with PVC frame with min facilitation for scapula/elbow and cues with incr ROM noted without pain.                   Elbow Extension    AAROM;Supine;Both;10 reps    chest press with PVC frame                               Other Grasp and Release Exercises     Low range functional reaching to place connect 4 checkers in frame, min-mod difficulty, min facilitation          Attempted arm bike however discontinued because pt was unable to achieve smooth reciprocal movement with LUE.  OT Short Term Goals - 10/04/15 0958    OT SHORT TERM GOAL #1   Title Pt will be independent with initial HEP.--check STGs 10/09/15   Baseline dependent   Time 6   Period Weeks   Status Achieved   OT SHORT TERM GOAL #2   Title Pt will demo at least 110* L shoulder flexion for functional reaching/ADLs without pain.   Baseline 90*   Time 6   Period Weeks   Status On-going  10/04/15:  100* with no pain, but min compensation/IR   OT SHORT TERM GOAL #3   Title Pt will demo ability to use LUE as stabilizer for ADLs.   Baseline unable   Time 6   Period Weeks   Status On-going  10/04/15:  inconsistent   OT SHORT TERM GOAL #4   Title Pt will demo ability to pick up 1inch object using tip pinch in 4/5 trials.   Baseline uses gross grasp or lateral pinch with difficulty   Time 6   Period Weeks   Status Achieved  10/04/15 met   OT  SHORT TERM GOAL #5   Title Pt will be able to grasp/release cylinder object mod I in 4/5 trials.   Baseline min A   Time 6   Period Weeks   Status Achieved  09/27/15           OT Long Term Goals - 09/25/15 1119    OT LONG TERM GOAL #1   Title Pt will be independent with initial HEP.--check STGs 11/20/15   Baseline dependent   Time 12   Period Weeks   Status On-going   OT LONG TERM GOAL #2   Title Pt will demo at least 120* L shoulder flexion for functional reaching/ADLs without pain.   Baseline 90*   Time 12   Period Weeks   Status On-going   OT LONG TERM GOAL #3   Title Pt will use LUE as gross assist for ADLs at least 75% of the time.   Baseline unable   Time 12   Period Weeks   Status On-going   OT LONG TERM GOAL #4   Title Pt will demo at least 105* L shoulder abduction for ADLs without pain.   Baseline 90*   Time 12   Period Weeks   Status On-going   OT LONG TERM GOAL #5   Title Pt will improve LUE coordination as shown by scoring at least 5 on box and blocks test.   Baseline unable   Time 12   Period Weeks   Status On-going               Plan - 10/11/15 1642    Clinical Impression Statement Pt is progressing towards goals for LUE functional use.   Pt will benefit from skilled therapeutic intervention in order to improve on the following deficits (Retired) Decreased mobility;Decreased strength;Impaired UE functional use;Pain;Decreased knowledge of use of DME;Decreased balance;Impaired tone;Decreased range of motion;Decreased coordination   Rehab Potential Good   Clinical Impairments Affecting Rehab Potential pt reports that sister works long hours, financial concerns   OT Frequency 2x / week   OT Duration 12 weeks   OT Treatment/Interventions Cryotherapy;Parrafin;Electrical Stimulation;Fluidtherapy;Moist Heat;Passive range of motion;Therapeutic activities;DME and/or AE instruction;Therapeutic exercises;Iontophoresis;Ultrasound;Neuromuscular  education;Manual Therapy;Splinting;Patient/family education;Therapist, nutritional;Therapeutic exercise;Self-care/ADL training;Energy conservation;Contrast Bath;Cognitive remediation/compensation;Visual/perceptual remediation/compensation   Plan neuro re-ed   OT Home Exercise Plan Education issued:  initial HEP 08/28/15; additional HEP 09/11/15, 09/13/15 wrist cock up brace and functional  reach [place] and Agree with Plan of Care Patient        Problem List Patient Active Problem List   Diagnosis Date Noted  . Gait disturbance, post-stroke 08/14/2015  . Frozen shoulder syndrome 08/14/2015  . Subacromial impingement of left shoulder 08/14/2015  . Left shoulder pain 08/10/2015  . Diabetes type 2, controlled ([place]) 07/31/2015  . Eczema 07/31/2015  . Right middle cerebral artery stroke ([place]) 06/14/2015  . Left hemiparesis ([place])   . Left-sided neglect   . Embolic stroke of right basal ganglia ([place]) 06/11/2015  . TIA (transient ischemic attack) 06/09/2013  . Hypokalemia 06/09/2013  . PFO (patent foramen ovale) 06/09/2013  . Hyperlipidemia 12/10/2009  . Essential hypertension 12/10/2009  . Asthma 12/10/2009  . CEREBROVASCULAR ACCIDENT, HX OF 12/10/2009    RINE,KATHRYN 10/11/2015, 4:45 PM  [facility] [address] [place], [place], [address] Phone: [phone]   Fax:  [phone]  Name: Rebecca Yoder MRN: [id number] Date of Birth: 02/20/1966

## 2015-10-12 ENCOUNTER — Ambulatory Visit: Payer: Medicaid Other | Admitting: Occupational Therapy

## 2015-10-12 DIAGNOSIS — G8194 Hemiplegia, unspecified affecting left nondominant side: Secondary | ICD-10-CM

## 2015-10-12 DIAGNOSIS — R279 Unspecified lack of coordination: Secondary | ICD-10-CM

## 2015-10-12 DIAGNOSIS — M25612 Stiffness of left shoulder, not elsewhere classified: Secondary | ICD-10-CM

## 2015-10-12 DIAGNOSIS — M25512 Pain in left shoulder: Secondary | ICD-10-CM

## 2015-10-12 DIAGNOSIS — R252 Cramp and spasm: Secondary | ICD-10-CM

## 2015-10-12 DIAGNOSIS — R278 Other lack of coordination: Secondary | ICD-10-CM

## 2015-10-12 NOTE — Therapy (Signed)
[facility] [address] [place], [place], [address] Phone: [phone]   Fax:  [phone]  Occupational Therapy Treatment  Patient Details  Name: Rebecca Yoder MRN: [id number] Date of Birth: 1965-12-17 Referring Provider: Dr. Alysia Penna  Encounter Date: 10/12/2015      OT End of Session - 10/12/15 1229    Visit Number 10   Number of Visits 13   Date for OT Re-Evaluation 10/23/15   Authorization Type Medicaid pending  (however, pt wants to continue regardless)   Authorization Time Period await MCD auth   Authorization - Visit Number 9   Authorization - Number of Visits 12   OT Start Time 9784   OT Stop Time 1102   OT Time Calculation (min) 25 min   Activity Tolerance Patient tolerated treatment well   Behavior During Therapy [facility] for tasks assessed/performed      Past Medical History  Diagnosis Date  . ASTHMA 12/10/2009  . CEREBROVASCULAR ACCIDENT, HX OF 12/10/2009  . HYPERLIPIDEMIA 12/10/2009  . HYPERTENSION 12/10/2009  . Complication of anesthesia     "just can't get me up" (06/09/2013)  . Stroke [facility]) 2008    denies residual on 06/09/2013  . Tendonitis of wrist, right   . Embolic stroke of right basal ganglia ([place]) 06/11/2015    with L residual weakness/hemiparesis     Past Surgical History  Procedure Laterality Date  . Ovarian cyst removal  1990's    There were no vitals filed for this visit.  Visit Diagnosis:  Shoulder stiffness, left  Decreased coordination  Left shoulder pain  Left hemiparesis (HCC)  Spasticity      Subjective Assessment - 10/12/15 1210    Pertinent History hx of multiple CVAs (pt reports 06/11/15 CVA was the 4th),was independent prior to 06/11/15 CVA (living alone, taking care of mother who has CA, driving, working, and using LUE functionally), now living with sister/brother in-law who works a lot   Limitations pt reports no significant deficits from first 3 CVAs   Patient Stated Goals improve LUE functional use, incr ADL independence   Currently in Pain? Yes   Pain Score 7    Pain Location Shoulder   Pain Orientation Left   Pain Descriptors / Indicators Aching   Pain Type Chronic pain   Pain Onset More than a month ago   Pain Frequency Intermittent      Neurological Re-education Exercises           Shoulder Flexion    AAROM;bilateral UE's 20 reps;Supine    with PVC frame with min faciliation Then pt self ROM, hands clasped with therapist facilitating scapula  Then in sitting weightbearing through tilted stool shoulder flexion/ elbow extension with min facilitation without pain.  Hotpack applied while pt performing for pain relief at shoulder x 10 mins, no adverse reactions           Weightbearing through LUE egdge of mat with min facilitation, with pt attempting elbow extension                                                          OT Short Term Goals - 10/04/15 0958    OT SHORT TERM GOAL #1   Title Pt will be independent with initial HEP.--check STGs 10/09/15  Baseline dependent   Time 6   Period Weeks   Status Achieved   OT SHORT TERM GOAL #2   Title Pt will demo at least 110* L shoulder flexion for functional reaching/ADLs without pain.   Baseline 90*   Time 6   Period Weeks   Status On-going  10/04/15:  100* with no pain, but min compensation/IR   OT SHORT TERM GOAL #3   Title Pt will demo ability to use LUE as stabilizer for ADLs.   Baseline unable   Time 6   Period Weeks   Status On-going  10/04/15:  inconsistent   OT SHORT TERM GOAL #4   Title Pt will demo ability to pick up 1inch object using tip pinch in 4/5 trials.   Baseline uses gross grasp or lateral pinch with difficulty   Time 6   Period Weeks   Status Achieved  10/04/15 met   OT SHORT TERM GOAL #5   Title Pt will be able to grasp/release cylinder object mod I in 4/5 trials.   Baseline min A   Time 6   Period Weeks   Status Achieved   09/27/15           OT Long Term Goals - 09/25/15 1119    OT LONG TERM GOAL #1   Title Pt will be independent with initial HEP.--check STGs 11/20/15   Baseline dependent   Time 12   Period Weeks   Status On-going   OT LONG TERM GOAL #2   Title Pt will demo at least 120* L shoulder flexion for functional reaching/ADLs without pain.   Baseline 90*   Time 12   Period Weeks   Status On-going   OT LONG TERM GOAL #3   Title Pt will use LUE as gross assist for ADLs at least 75% of the time.   Baseline unable   Time 12   Period Weeks   Status On-going   OT LONG TERM GOAL #4   Title Pt will demo at least 105* L shoulder abduction for ADLs without pain.   Baseline 90*   Time 12   Period Weeks   Status On-going   OT LONG TERM GOAL #5   Title Pt will improve LUE coordination as shown by scoring at least 5 on box and blocks test.   Baseline unable   Time 12   Period Weeks   Status On-going               Plan - 10/12/15 1227    Clinical Impression Statement Pt is progressing towards goals with improving LUE functional use.   Pt will benefit from skilled therapeutic intervention in order to improve on the following deficits (Retired) Decreased mobility;Decreased strength;Impaired UE functional use;Pain;Decreased knowledge of use of DME;Decreased balance;Impaired tone;Decreased range of motion;Decreased coordination   Rehab Potential Good   Clinical Impairments Affecting Rehab Potential pt reports that sister works long hours, financial concerns   OT Frequency 2x / week   OT Duration 12 weeks   OT Treatment/Interventions Cryotherapy;Parrafin;Electrical Stimulation;Fluidtherapy;Moist Heat;Passive range of motion;Therapeutic activities;DME and/or AE instruction;Therapeutic exercises;Iontophoresis;Ultrasound;Neuromuscular education;Manual Therapy;Splinting;Patient/family education;Therapist, nutritional;Therapeutic exercise;Self-care/ADL training;Energy conservation;Contrast  Bath;Cognitive remediation/compensation;Visual/perceptual remediation/compensation   Plan neuro re-ed , LUE functional use   OT Home Exercise Plan Education issued:  initial HEP 08/28/15; additional HEP 09/11/15, 09/13/15 wrist cock up brace and functional reach [place] and Agree with Plan of Care Patient        Problem List Patient Active Problem  List   Diagnosis Date Noted  . Gait disturbance, post-stroke 08/14/2015  . Frozen shoulder syndrome 08/14/2015  . Subacromial impingement of left shoulder 08/14/2015  . Left shoulder pain 08/10/2015  . Diabetes type 2, controlled ([place]) 07/31/2015  . Eczema 07/31/2015  . Right middle cerebral artery stroke ([place]) 06/14/2015  . Left hemiparesis ([place])   . Left-sided neglect   . Embolic stroke of right basal ganglia ([place]) 06/11/2015  . TIA (transient ischemic attack) 06/09/2013  . Hypokalemia 06/09/2013  . PFO (patent foramen ovale) 06/09/2013  . Hyperlipidemia 12/10/2009  . Essential hypertension 12/10/2009  . Asthma 12/10/2009  . CEREBROVASCULAR ACCIDENT, HX OF 12/10/2009    Rebecca Yoder 10/12/2015, 12:32 PM Theone Murdoch, OTR/L [phone] Phone: [phone] 12:32 PM 10/12/2015 [facility] [address] [place], [place], [address] Phone: [phone]   Fax:  [phone]  Name: Rebecca Yoder MRN: [id number] Date of Birth: July 25, 1966

## 2015-10-15 ENCOUNTER — Ambulatory Visit: Payer: Medicaid Other | Admitting: Occupational Therapy

## 2015-10-15 ENCOUNTER — Telehealth: Payer: Self-pay | Admitting: *Deleted

## 2015-10-15 DIAGNOSIS — R279 Unspecified lack of coordination: Secondary | ICD-10-CM

## 2015-10-15 DIAGNOSIS — M25612 Stiffness of left shoulder, not elsewhere classified: Secondary | ICD-10-CM

## 2015-10-15 DIAGNOSIS — G8194 Hemiplegia, unspecified affecting left nondominant side: Secondary | ICD-10-CM | POA: Diagnosis not present

## 2015-10-15 DIAGNOSIS — R278 Other lack of coordination: Secondary | ICD-10-CM

## 2015-10-15 DIAGNOSIS — M25512 Pain in left shoulder: Secondary | ICD-10-CM

## 2015-10-15 DIAGNOSIS — R252 Cramp and spasm: Secondary | ICD-10-CM

## 2015-10-15 NOTE — Therapy (Signed)
[facility] [address] [place], [place], [address] Phone: [phone]   Fax:  [phone]  Occupational Therapy Treatment  Patient Details  Name: Rebecca Yoder MRN: [id number] Date of Birth: 1966-07-18 Referring Provider: Dr. Alysia Penna  Encounter Date: 10/15/2015      OT End of Session - 10/15/15 1321    Visit Number 11   Number of Visits 19   Date for OT Re-Evaluation 10/23/15   Authorization Type was Medicaid pending--pt did not want to wait to schedule; now approved for Medicaid   Authorization Time Period [employer] APPROVED 12 OT VISITS FROM 2/23-5/17/17   Authorization - Visit Number 4   Authorization - Number of Visits 12   OT Start Time 6962   OT Stop Time 1400   OT Time Calculation (min) 43 min   Activity Tolerance Patient tolerated treatment well   Behavior During Therapy [facility] for tasks assessed/performed      Past Medical History  Diagnosis Date  . ASTHMA 12/10/2009  . CEREBROVASCULAR ACCIDENT, HX OF 12/10/2009  . HYPERLIPIDEMIA 12/10/2009  . HYPERTENSION 12/10/2009  . Complication of anesthesia     "just can't get me up" (06/09/2013)  . Stroke [facility]) 2008    denies residual on 06/09/2013  . Tendonitis of wrist, right   . Embolic stroke of right basal ganglia ([place]) 06/11/2015    with L residual weakness/hemiparesis     Past Surgical History  Procedure Laterality Date  . Ovarian cyst removal  1990's    There were no vitals filed for this visit.  Visit Diagnosis:  Left hemiparesis (HCC)  Shoulder stiffness, left  Decreased coordination  Left shoulder pain  Spasticity      Subjective Assessment - 10/15/15 1321    Subjective  Pt reports significant pain in the mornings   Pertinent History hx of multiple CVAs (pt reports 06/11/15 CVA was the 4th),was independent prior to 06/11/15 CVA (living alone, taking care of mother who has CA, driving, working, and using LUE functionally), now living  with sister/brother in-law who works a lot   Limitations pt reports no significant deficits from first 3 CVAs   Patient Stated Goals improve LUE functional use, incr ADL independence   Currently in Pain? No/denies  currently   Aggravating Factors  malpositioning, end-range stretching   Pain Relieving Factors stretching within pain free range which improves with repetition.                      OT Treatments/Exercises (OP) - 10/15/15 0001    Neurological Re-education Exercises   Shoulder Flexion AAROM;Both;10 reps;Seated  with PVC frame with min facilitation   Shoulder ABduction AAROM;Left;Seated  with ball and along horizontal surface with min facilitation   Elbow Extension AAROM;Left;Seated  with UE ranger with min cueing    Other Grasp and Release Exercises  Low-range functional reaching to grasp/release cylinder pegs objects with min cueing for compensation and then putting in pegboard with mod-max difficulty,.   Seated with weight on hand with min cues for body on arm movements for incr shoulder stabilization, tricep activation.     Reciprocal Movements Arm bike x29mn level 1 for reciprocal movement with min cues for compensation with L hand wrapped.                OT Education - 10/15/15 1348    Education Details Proper positioning of LUE in bed due to reports of pain in the morning  Person(s) Educated Patient   Methods Explanation;Demonstration;Verbal cues;Handout   Comprehension Verbalized understanding          OT Short Term Goals - 10/15/15 1332    OT SHORT TERM GOAL #1   Title Pt will be independent with initial HEP.--check STGs 10/09/15   Baseline dependent   Time 6   Period Weeks   Status Achieved   OT SHORT TERM GOAL #2   Title Pt will demo at least 110* L shoulder flexion for functional reaching/ADLs without pain.   Baseline 90*   Time 6   Period Weeks   Status On-going  10/04/15:  100* with no pain, but min compensation/IR   OT SHORT  TERM GOAL #3   Title Pt will demo ability to use LUE as stabilizer for ADLs.   Baseline unable   Time 6   Period Weeks   Status On-going  10/04/15:  inconsistent   OT SHORT TERM GOAL #4   Title Pt will demo ability to pick up 1inch object using tip pinch in 4/5 trials.   Baseline uses gross grasp or lateral pinch with difficulty   Time 6   Period Weeks   Status Achieved  10/04/15 met   OT SHORT TERM GOAL #5   Title Pt will be able to grasp/release cylinder object mod I in 4/5 trials.   Baseline min A   Time 6   Period Weeks   Status Achieved  09/27/15           OT Long Term Goals - 09/25/15 1119    OT LONG TERM GOAL #1   Title Pt will be independent with initial HEP.--check STGs 11/20/15   Baseline dependent   Time 12   Period Weeks   Status On-going   OT LONG TERM GOAL #2   Title Pt will demo at least 120* L shoulder flexion for functional reaching/ADLs without pain.   Baseline 90*   Time 12   Period Weeks   Status On-going   OT LONG TERM GOAL #3   Title Pt will use LUE as gross assist for ADLs at least 75% of the time.   Baseline unable   Time 12   Period Weeks   Status On-going   OT LONG TERM GOAL #4   Title Pt will demo at least 105* L shoulder abduction for ADLs without pain.   Baseline 90*   Time 12   Period Weeks   Status On-going   OT LONG TERM GOAL #5   Title Pt will improve LUE coordination as shown by scoring at least 5 on box and blocks test.   Baseline unable   Time 12   Period Weeks   Status On-going               Plan - 10/15/15 1346    Clinical Impression Statement Pt is progressing towards goals with improving LUE functional use and ability to grasp/release objects.  Pt reports pain in the morning so instructed pt in proper positioning of LUE in bed and recommended pt discuss with MD.  Pt demo incr ROM with repetition and stretching but is limited with pain initially (however, encouraged pt to move in pain free range only and incr ROM  as able).   Pt will benefit from skilled therapeutic intervention in order to improve on the following deficits (Retired) Decreased mobility;Decreased strength;Impaired UE functional use;Pain;Decreased knowledge of use of DME;Decreased balance;Impaired tone;Decreased range of motion;Decreased coordination   Rehab Potential Good  Clinical Impairments Affecting Rehab Potential pt reports that sister works long hours, financial concerns   OT Frequency 2x / week   OT Duration 12 weeks   OT Treatment/Interventions Cryotherapy;Parrafin;Electrical Stimulation;Fluidtherapy;Moist Heat;Passive range of motion;Therapeutic activities;DME and/or AE instruction;Therapeutic exercises;Iontophoresis;Ultrasound;Neuromuscular education;Manual Therapy;Splinting;Patient/family education;Therapist, nutritional;Therapeutic exercise;Self-care/ADL training;Energy conservation;Contrast Bath;Cognitive remediation/compensation;Visual/perceptual remediation/compensation   Plan neuro re-ed, LUE functional use   OT Home Exercise Plan Education issued:  initial HEP 08/28/15; additional HEP 09/11/15, 09/13/15 wrist cock up brace and functional reach [place] and Agree with Plan of Care Patient        Problem List Patient Active Problem List   Diagnosis Date Noted  . Gait disturbance, post-stroke 08/14/2015  . Frozen shoulder syndrome 08/14/2015  . Subacromial impingement of left shoulder 08/14/2015  . Left shoulder pain 08/10/2015  . Diabetes type 2, controlled ([place]) 07/31/2015  . Eczema 07/31/2015  . Right middle cerebral artery stroke ([place]) 06/14/2015  . Left hemiparesis ([place])   . Left-sided neglect   . Embolic stroke of right basal ganglia ([place]) 06/11/2015  . TIA (transient ischemic attack) 06/09/2013  . Hypokalemia 06/09/2013  . PFO (patent foramen ovale) 06/09/2013  . Hyperlipidemia 12/10/2009  . Essential hypertension 12/10/2009  . Asthma 12/10/2009  . CEREBROVASCULAR ACCIDENT, HX OF 12/10/2009     [facility] 10/15/2015, 4:51 PM  [facility] [address] [place], [place], [address] Phone: [phone]   Fax:  [phone]  Name: CAYLIE SANDQUIST MRN: [id number] Date of Birth: February 22, 1966  Vianne Bulls, OTR/L [facility] [address] [place], [place]  [address] [phone] phone [phone] 10/15/2015 4:51 PM

## 2015-10-15 NOTE — Telephone Encounter (Signed)
Pt left a message concerning paperwork that was sent over to our office. It was hard to discern what she was saying as her speech was garbled. I spoke with Johnette and we found the paperwork in question.   I called the patient back and let them know that it will be filled out and sent within the next couple of days

## 2015-10-16 ENCOUNTER — Ambulatory Visit: Payer: Medicaid Other | Admitting: Physical Medicine & Rehabilitation

## 2015-10-18 ENCOUNTER — Encounter: Payer: Self-pay | Admitting: Family Medicine

## 2015-10-18 ENCOUNTER — Ambulatory Visit: Payer: Medicaid Other | Attending: Family Medicine | Admitting: Family Medicine

## 2015-10-18 ENCOUNTER — Other Ambulatory Visit (HOSPITAL_COMMUNITY)
Admission: RE | Admit: 2015-10-18 | Discharge: 2015-10-18 | Disposition: A | Discharge status: Home / Self Care | Payer: Medicaid Other | Source: Ambulatory Visit | Attending: Family Medicine | Admitting: Family Medicine

## 2015-10-18 ENCOUNTER — Encounter: Payer: Medicaid Other | Admitting: Occupational Therapy

## 2015-10-18 VITALS — BP 178/116 | HR 85 | Temp 98.2°F | Wt 169.8 lb

## 2015-10-18 DIAGNOSIS — N760 Acute vaginitis: Secondary | ICD-10-CM

## 2015-10-18 DIAGNOSIS — I10 Essential (primary) hypertension: Secondary | ICD-10-CM | POA: Diagnosis not present

## 2015-10-18 DIAGNOSIS — Z124 Encounter for screening for malignant neoplasm of cervix: Secondary | ICD-10-CM | POA: Diagnosis not present

## 2015-10-18 DIAGNOSIS — M7542 Impingement syndrome of left shoulder: Secondary | ICD-10-CM

## 2015-10-18 DIAGNOSIS — Z113 Encounter for screening for infections with a predominantly sexual mode of transmission: Secondary | ICD-10-CM | POA: Insufficient documentation

## 2015-10-18 DIAGNOSIS — Z1151 Encounter for screening for human papillomavirus (HPV): Secondary | ICD-10-CM | POA: Diagnosis present

## 2015-10-18 DIAGNOSIS — G44229 Chronic tension-type headache, not intractable: Secondary | ICD-10-CM | POA: Diagnosis not present

## 2015-10-18 DIAGNOSIS — E119 Type 2 diabetes mellitus without complications: Secondary | ICD-10-CM

## 2015-10-18 DIAGNOSIS — Z01419 Encounter for gynecological examination (general) (routine) without abnormal findings: Secondary | ICD-10-CM | POA: Insufficient documentation

## 2015-10-18 DIAGNOSIS — A5901 Trichomonal vulvovaginitis: Secondary | ICD-10-CM

## 2015-10-18 DIAGNOSIS — A499 Bacterial infection, unspecified: Secondary | ICD-10-CM

## 2015-10-18 DIAGNOSIS — B9689 Other specified bacterial agents as the cause of diseases classified elsewhere: Secondary | ICD-10-CM

## 2015-10-18 LAB — POCT GLYCOSYLATED HEMOGLOBIN (HGB A1C): HEMOGLOBIN A1C: 5.8

## 2015-10-18 LAB — GLUCOSE, POCT (MANUAL RESULT ENTRY): POC GLUCOSE: 109 mg/dL — AB (ref 70–99)

## 2015-10-18 MED ORDER — HYDRALAZINE HCL 10 MG PO TABS: 10.0000 mg | ORAL_TABLET | Freq: Three times a day (TID) | ORAL | Status: DC

## 2015-10-18 MED ORDER — AMLODIPINE BESYLATE 10 MG PO TABS: 10.0000 mg | ORAL_TABLET | Freq: Every day | ORAL | Status: DC

## 2015-10-18 MED ORDER — ACETAMINOPHEN-CODEINE #3 300-30 MG PO TABS: 1.0000 | ORAL_TABLET | Freq: Three times a day (TID) | ORAL | Status: DC | PRN

## 2015-10-18 MED FILL — hydrALAZINE HCL 10 MG TABS: 10 | 30 days supply | Qty: 90 | Fill #0

## 2015-10-18 MED FILL — ACETAMINOPHEN/COD #3 TABLET: 300-30 | 10 days supply | Qty: 60 | Fill #0

## 2015-10-18 MED FILL — AMLODIPINE BESYLATE 10 MG T: 10 | 30 days supply | Qty: 30 | Fill #0

## 2015-10-18 NOTE — Assessment & Plan Note (Signed)
A; uncontrolled. Patient has had one stroke already P: Continue losartan 100 mg daily Add norvasc 10 mg daily Add hydralazine 10 mg TID

## 2015-10-18 NOTE — Assessment & Plan Note (Signed)
A: tension HA with HTN P: Adjust BP meds per orders Tylenol #3 for HTN

## 2015-10-18 NOTE — Patient Instructions (Signed)
Ryleigh was seen today for gynecologic exam and headache.  Diagnoses and all orders for this visit:  Pap smear for cervical cancer screening -     Cytology - PAP  Controlled type 2 diabetes mellitus without complication, without long-term current use of insulin (HCC) -     Glucose (CBG) -     HgB A1c  Essential hypertension -     hydrALAZINE (APRESOLINE) 10 MG tablet; Take 1 tablet (10 mg total) by mouth 3 (three) times daily. -     amLODipine (NORVASC) 10 MG tablet; Take 1 tablet (10 mg total) by mouth daily.  Chronic tension-type headache, not intractable -     acetaminophen-codeine (TYLENOL #3) 300-30 MG tablet; Take 1-2 tablets by mouth every 8 (eight) hours as needed for moderate pain.  Subacromial impingement of left shoulder -     acetaminophen-codeine (TYLENOL #3) 300-30 MG tablet; Take 1-2 tablets by mouth every 8 (eight) hours as needed for moderate pain.   Headache is very likely related to HTN Add norvasc 10 mg daily Add hydralazine 10 mg TID Refilled tylenol #3 for HA and shoulder pain   Home care for cold symptoms x one day You have a viral URI with cough. For this please do the following:  1. Drink plenty of fluids. Hot tea, soup etc will help open your nasal passages. 2. Dextromethorphan 30 mg every 6-8 hrs (plain Robitussin) for cough suppression 3. Tylenol for pain up to 1000 mg three times daily.  4. Nasal saline-OTC nose spray for congestion.   F/u for chest pain, shortness of breath, persistent high fever.  Send me an email if cold symptoms are not much better in one week  F/u in 4 weeks for HTN  Dr. Adrian Blackwater

## 2015-10-18 NOTE — Progress Notes (Signed)
Subjective:  Patient ID: Rebecca Yoder, female    DOB: December 09, 1965  Age: 50 y.o. MRN: [id number]  CC: Gynecologic Exam and Headache   HPI Rebecca Yoder presents for    1. Headache: tension and pressure across forehead. 2-3 times per week. Since stroke. Has HTN. Compliant with all meds.  2. Pap: due for screening pap. No pelvic pain or discharge.   3. Cold symptoms: x one day. Cough, congestion and runny nose. No fever, chills, SOB.  Social History  Substance Use Topics  . Smoking status: Never Smoker   . Smokeless tobacco: Never Used  . Alcohol Use: 0.0 oz/week    0 Standard drinks or equivalent per week     Comment: 06/09/2013 "might drink a couple times/yr"    Outpatient Prescriptions Prior to Visit  Medication Sig Dispense Refill  . acetaminophen (TYLENOL) 325 MG tablet Take 2 tablets (650 mg total) by mouth every 6 (six) hours as needed. 20 tablet 0  . albuterol (PROAIR HFA) 108 (90 BASE) MCG/ACT inhaler Inhale 2 puffs into the lungs every 6 (six) hours as needed for wheezing. 9 g 5  . clopidogrel (PLAVIX) 75 MG tablet Take 1 tablet (75 mg total) by mouth daily. 30 tablet 2  . losartan (COZAAR) 100 MG tablet Take 1 tablet (100 mg total) by mouth daily. 30 tablet 5  . methocarbamol (ROBAXIN) 500 MG tablet Take 1 tablet (500 mg total) by mouth at bedtime as needed for muscle spasms. 30 tablet 2  . Multiple Vitamin (MULTIVITAMIN WITH MINERALS) TABS tablet Take 1 tablet by mouth daily.    . pantoprazole (PROTONIX) 40 MG tablet Take 1 tablet (40 mg total) by mouth daily. 30 tablet 2  . pravastatin (PRAVACHOL) 20 MG tablet Take 1 tablet (20 mg total) by mouth daily. 30 tablet 2  . triamcinolone ointment (KENALOG) 0.5 % Apply 1 application topically 2 (two) times daily. 30 g 0  . acetaminophen-codeine (TYLENOL #3) 300-30 MG tablet Take 1 tablet by mouth every 8 (eight) hours as needed for moderate pain. (Patient not taking: Reported on 10/18/2015) 60 tablet 0   No  facility-administered medications prior to visit.    ROS Review of Systems  Constitutional: Negative for fever and chills.  HENT: Positive for congestion.   Eyes: Negative for visual disturbance.  Respiratory: Positive for cough. Negative for shortness of breath.   Cardiovascular: Negative for chest pain.  Gastrointestinal: Negative for abdominal pain and blood in stool.  Musculoskeletal: Positive for arthralgias (L shoulder pain ). Negative for back pain.  Skin: Negative for rash.  Allergic/Immunologic: Negative for immunocompromised state.  Neurological: Positive for headaches.  Hematological: Negative for adenopathy. Does not bruise/bleed easily.  Psychiatric/Behavioral: Negative for suicidal ideas and dysphoric mood.    Objective:  BP 178/116 mmHg  Pulse 85  Temp(Src) 98.2 F (36.8 C) (Oral)  Wt 169 lb 12.8 oz (77.021 kg)  LMP 09/21/2015 (Exact Date)  BP/Weight 10/18/2015 [id number]  Systolic BP 0000000 0000000 [id number]  Diastolic BP 99991111 92 91  Wt. (Lbs) 169.8 - -  BMI 28.26 - -   Physical Exam  Constitutional: She is oriented to person, place, and time. She appears well-developed and well-nourished. No distress.  HENT:  Head: Normocephalic and atraumatic.  Cardiovascular: Normal rate, regular rhythm, normal heart sounds and intact distal pulses.   Pulmonary/Chest: Effort normal and breath sounds normal.  Genitourinary: Uterus normal. Pelvic exam was performed with patient prone. There is no rash, tenderness or lesion on  the right labia. There is no rash, tenderness or lesion on the left labia. Cervix exhibits no motion tenderness, no discharge and no friability. Vaginal discharge found.  Musculoskeletal: She exhibits no edema.  Lymphadenopathy:       Right: No inguinal adenopathy present.       Left: No inguinal adenopathy present.  Neurological: She is alert and oriented to person, place, and time.  Skin: Skin is warm and dry. No rash noted.  Psychiatric: She has a normal  mood and affect.   Lab Results  Component Value Date   HGBA1C 5.8 10/18/2015   CBG 109  Assessment & Plan:  Rebecca Yoder was seen today for gynecologic exam and headache.  Diagnoses and all orders for this visit:  Pap smear for cervical cancer screening -     Cytology - PAP  Controlled type 2 diabetes mellitus without complication, without long-term current use of insulin (HCC) -     Glucose (CBG) -     HgB A1c  Essential hypertension -     hydrALAZINE (APRESOLINE) 10 MG tablet; Take 1 tablet (10 mg total) by mouth 3 (three) times daily. -     amLODipine (NORVASC) 10 MG tablet; Take 1 tablet (10 mg total) by mouth daily.  Chronic tension-type headache, not intractable -     acetaminophen-codeine (TYLENOL #3) 300-30 MG tablet; Take 1-2 tablets by mouth every 8 (eight) hours as needed for moderate pain.  Subacromial impingement of left shoulder -     acetaminophen-codeine (TYLENOL #3) 300-30 MG tablet; Take 1-2 tablets by mouth every 8 (eight) hours as needed for moderate pain.     Meds ordered this encounter  Medications  . hydrALAZINE (APRESOLINE) 10 MG tablet    Sig: Take 1 tablet (10 mg total) by mouth 3 (three) times daily.    Dispense:  90 tablet    Refill:  2  . acetaminophen-codeine (TYLENOL #3) 300-30 MG tablet    Sig: Take 1-2 tablets by mouth every 8 (eight) hours as needed for moderate pain.    Dispense:  60 tablet    Refill:  2  . amLODipine (NORVASC) 10 MG tablet    Sig: Take 1 tablet (10 mg total) by mouth daily.    Dispense:  30 tablet    Refill:  5    Follow-up: No Follow-up on file.   Boykin Nearing MD

## 2015-10-19 LAB — CERVICOVAGINAL ANCILLARY ONLY
CHLAMYDIA, DNA PROBE: NEGATIVE
Neisseria Gonorrhea: NEGATIVE
Wet Prep (BD Affirm): POSITIVE — AB

## 2015-10-19 LAB — CYTOLOGY - PAP

## 2015-10-19 MED ORDER — METRONIDAZOLE 500 MG PO TABS
500.0000 mg | ORAL_TABLET | Freq: Two times a day (BID) | ORAL | Status: DC
Start: 2015-10-19 — End: 2015-11-02

## 2015-10-19 MED ORDER — FLUCONAZOLE 150 MG PO TABS: 150.0000 mg | ORAL_TABLET | Freq: Once | ORAL | Status: DC

## 2015-10-19 MED FILL — FLUCONAZOLE 150 MG TABLET: 150 | 1 days supply | Qty: 1 | Fill #0

## 2015-10-19 MED FILL — metroNIDAZOLE 500 MG TABS: 500 | 7 days supply | Qty: 14 | Fill #0

## 2015-10-19 NOTE — Addendum Note (Signed)
Addended by: Boykin Nearing on: 10/19/2015 02:37 PM   Modules accepted: Orders, SmartSet

## 2015-10-22 ENCOUNTER — Ambulatory Visit: Payer: Medicaid Other | Admitting: Physical Therapy

## 2015-10-22 ENCOUNTER — Ambulatory Visit: Payer: Medicaid Other | Admitting: Occupational Therapy

## 2015-10-22 DIAGNOSIS — G8194 Hemiplegia, unspecified affecting left nondominant side: Secondary | ICD-10-CM

## 2015-10-22 DIAGNOSIS — M25512 Pain in left shoulder: Secondary | ICD-10-CM

## 2015-10-22 DIAGNOSIS — R279 Unspecified lack of coordination: Secondary | ICD-10-CM

## 2015-10-22 DIAGNOSIS — R252 Cramp and spasm: Secondary | ICD-10-CM

## 2015-10-22 DIAGNOSIS — M25612 Stiffness of left shoulder, not elsewhere classified: Secondary | ICD-10-CM

## 2015-10-22 DIAGNOSIS — R278 Other lack of coordination: Secondary | ICD-10-CM

## 2015-10-22 DIAGNOSIS — M6281 Muscle weakness (generalized): Secondary | ICD-10-CM

## 2015-10-22 NOTE — Therapy (Signed)
[facility] [address] [place], [place], [address] Phone: [phone]   Fax:  [phone]  Physical Therapy Treatment  Patient Details  Name: Rebecca Yoder MRN: [id number] Date of Birth: 10/01/1965 Referring Provider: Letta Pate  Encounter Date: 10/22/2015      PT End of Session - 10/22/15 1330    Visit Number 2   Number of Visits 13   Date for PT Re-Evaluation 12/05/15   Authorization Type Medicaid-12 visits requested   PT Start Time 1108   PT Stop Time 1148   PT Time Calculation (min) 40 min   Activity Tolerance Patient tolerated treatment well   Behavior During Therapy [facility] for tasks assessed/performed      Past Medical History  Diagnosis Date  . ASTHMA 12/10/2009  . CEREBROVASCULAR ACCIDENT, HX OF 12/10/2009  . HYPERLIPIDEMIA 12/10/2009  . HYPERTENSION 12/10/2009  . Complication of anesthesia     "just can't get me up" (06/09/2013)  . Stroke [facility]) 2008    denies residual on 06/09/2013  . Tendonitis of wrist, right   . Embolic stroke of right basal ganglia ([place]) 06/11/2015    with L residual weakness/hemiparesis     Past Surgical History  Procedure Laterality Date  . Ovarian cyst removal  1990's    There were no vitals filed for this visit.  Visit Diagnosis:  Left hemiparesis ([place])  Decreased coordination  Muscle weakness of lower extremity      Subjective Assessment - 10/22/15 1022    Subjective Nothing new to report.   Patient Stated Goals Pt's goal for therapy is to walk better and use hand better.   Currently in Pain? Yes   Pain Score 7    Pain Location Shoulder   Pain Orientation Left   Pain Descriptors / Indicators Aching;Sharp   Pain Onset More than a month ago   Aggravating Factors  same as previous                                 PT Education - 10/22/15 1038    Education provided Yes   Education Details Performed and initiated HEP for LE strengthening  (see handout for details)   Person(s) Educated Patient   Methods Explanation;Demonstration;Tactile cues;Verbal cues;Handout   Comprehension Returned demonstration;Need further instruction          PT Short Term Goals - 10/05/15 1203    PT SHORT TERM GOAL #1   Title Pt will be able to perform HEP with family supervision for improved balance, strength, gait.  TARGET 12/03/15   Baseline No current HEP for lower extremity strength/balance   Time 4   Period Weeks   Status New   PT SHORT TERM GOAL #2   Title Pt will perform at least 6 of 10 reps of sit<>stand transfers with minimal UE support for improved efficiency and safety with gait.   Baseline Supervision for transfers with definite UE support   Time 4   Period Weeks   Status New   PT SHORT TERM GOAL #3   Title Pt will improve TUG score to less than or equal to 38 seconds for decreased fall risk.   Baseline TUG 44.50 sec with RW   Time 4   Period Weeks   Status New   PT SHORT TERM GOAL #4   Title Pt will improve Berg Balance score to at least 27/56 for decreased fall risk.  Baseline Berg scores 22/56 (scores <45/56 indicate increased fall risk.)   Time 4   Period Weeks   Status New           PT Long Term Goals - 10/05/15 1207    PT LONG TERM GOAL #1   Title Pt will verbalize understanding of fall prevention within the home environment.  TARGET 12/05/15   Baseline at fall risk per Merrilee Jansky, TUG scores   Time 8   Period Weeks   Status New   PT LONG TERM GOAL #2   Title Pt will improve gait velocity to at least 1.2 ft/sec for improved gait efficiency and safety.   Baseline gait velocity 0.85 ft/sec   Time 8   Period Weeks   Status New   PT LONG TERM GOAL #3   Title Pt will improve TUG score to less than or equal to 30 seconds for decreased fall risk.   Baseline TUG 44.50 sec   Time 8   Period Weeks   Status New   PT LONG TERM GOAL #4   Title Pt will improve Berg Balance score to at least 32/56 for decreased fall  risk.   Baseline Berg score 22/56   Time 8   Period Weeks   Status New   PT LONG TERM GOAL #5   Title Pt will verbalize understanding of community fitness upon D/C from PT.   Baseline no current formal HEP/community fitness   Time 8   Period Weeks   Status New               Plan - 10/22/15 1023    Clinical Impression Statement Skilled session focused on initating and performing ther ex for LE strengthening for progressing functional gait.   Pt will benefit from skilled therapeutic intervention in order to improve on the following deficits Abnormal gait;Decreased balance;Decreased mobility;Decreased range of motion;Decreased strength;Difficulty walking;Impaired tone;Pain;Postural dysfunction   Rehab Potential Good   PT Frequency --  12 visits over 12 weeks   PT Duration 12 weeks   PT Treatment/Interventions ADLs/Self Care Home Management;Electrical Stimulation;Functional mobility training;Therapeutic activities;Therapeutic exercise;Gait training;DME Instruction;Balance training;Neuromuscular re-education;Patient/family education;Orthotic Fit/Training   PT Next Visit Plan Review HEP for LLE strengthening, increased weigthshifting/weightbearing on LLE during transfers; work on gait training to improve step through pattern with gait using RW   Consulted and Agree with Plan of Care Patient        Problem List Patient Active Problem List   Diagnosis Date Noted  . Chronic tension-type headache, not intractable 10/18/2015  . Gait disturbance, post-stroke 08/14/2015  . Frozen shoulder syndrome 08/14/2015  . Subacromial impingement of left shoulder 08/14/2015  . Left shoulder pain 08/10/2015  . Diabetes type 2, controlled ([place]) 07/31/2015  . Eczema 07/31/2015  . Right middle cerebral artery stroke ([place]) 06/14/2015  . Left hemiparesis ([place])   . Left-sided neglect   . Embolic stroke of right basal ganglia ([place]) 06/11/2015  . TIA (transient ischemic attack) 06/09/2013  .  Hypokalemia 06/09/2013  . PFO (patent foramen ovale) 06/09/2013  . Hyperlipidemia 12/10/2009  . Essential hypertension 12/10/2009  . Asthma 12/10/2009  . CEREBROVASCULAR ACCIDENT, HX OF 12/10/2009    Bjorn Loser, PTA  10/22/2015, 1:33 PM [facility] [address] [place], [place], [address] Phone: [phone]   Fax:  [phone]  Name: Rebecca Yoder MRN: [id number] Date of Birth: 02/19/1966

## 2015-10-22 NOTE — Therapy (Signed)
[facility] [address] [place], [place], [address] Phone: [phone]   Fax:  [phone]  Occupational Therapy Treatment  Patient Details  Name: Rebecca Yoder MRN: [id number] Date of Birth: [birth year] Referring Provider: Dr. Alysia Penna  Encounter Date: 10/22/2015      OT End of Session - 10/22/15 1109    Visit Number 12   Number of Visits 19   Date for OT Re-Evaluation 10/23/15   Authorization Type was Medicaid pending--pt did not want to wait to schedule; now approved for Medicaid   Authorization Time Period [employer] APPROVED 12 OT VISITS FROM 2/23-5/17/17   Authorization - Visit Number 5   Authorization - Number of Visits 12   OT Start Time 1104   OT Stop Time 1145   OT Time Calculation (min) 41 min   Activity Tolerance Patient tolerated treatment well   Behavior During Therapy [facility] for tasks assessed/performed      Past Medical History  Diagnosis Date  . ASTHMA 12/10/2009  . CEREBROVASCULAR ACCIDENT, HX OF 12/10/2009  . HYPERLIPIDEMIA 12/10/2009  . HYPERTENSION 12/10/2009  . Complication of anesthesia     "just can't get me up" (06/09/2013)  . Stroke [facility]) 2008    denies residual on 06/09/2013  . Tendonitis of wrist, right   . Embolic stroke of right basal ganglia ([place]) 06/11/2015    with L residual weakness/hemiparesis     Past Surgical History  Procedure Laterality Date  . Ovarian cyst removal  1990's    There were no vitals filed for this visit.  Visit Diagnosis:  Left hemiparesis (HCC)  Decreased coordination  Shoulder stiffness, left  Left shoulder pain  Spasticity      Subjective Assessment - 10/22/15 1110    Subjective  Pt missed MD appt last week with Dr. Letta Pate due to transportation being too late--rescheduled for 3/24 per pt.  "I've been working hard at home"   Pertinent History hx of multiple CVAs (pt reports 06/11/15 CVA was the 4th),was independent prior to 06/11/15 CVA  (living alone, taking care of mother who has CA, driving, working, and using LUE functionally), now living with sister/brother in-law who works a lot   Limitations pt reports no significant deficits from first 3 CVAs   Patient Stated Goals improve LUE functional use, incr ADL independence   Currently in Pain? No/denies         OT Treatments/Exercises (OP) - 10/15/15 0001    Neurological Re-education Exercises   Shoulder Flexion AAROM;Both;10 reps; supine, Seated  with PVC frame with min-mod cueing       Elbow Extension/shoulder flex AAROM;Left;Standing with UE ranger for mid-high range with min cueing/facilitation    Other Grasp and Release Exercises  Low-mid range functional reaching to grasp/release cylinder bjects with min cueing/facilitation for compensation and min difficulty.   Picking up checkers to place in container with min cueing for compensation and incr elbow ext with reach.   Standing with weight on hand with min-mod cues, min facilitation for body on arm movements for incr shoulder stabilization, tricep activation.    Reciprocal Movements Arm bike x72mn level 1 for reciprocal movement with min cues for compensation with L hand wrapped.                                  OT Education - 10/22/15 1142    Education Details Avoid IR/elbow out position, focus on  extending elbow with functional reach   Person(s) Educated Patient   Methods Explanation;Demonstration;Handout;Verbal cues;Tactile cues   Comprehension Verbalized understanding;Returned demonstration;Verbal cues required  improved with less cueing during session          OT Short Term Goals - 10/15/15 1332    OT SHORT TERM GOAL #1   Title Pt will be independent with initial HEP.--check STGs 10/09/15   Baseline dependent   Time 6   Period Weeks   Status Achieved   OT SHORT TERM GOAL #2   Title Pt will demo at least 110* L shoulder flexion for functional reaching/ADLs  without pain.   Baseline 90*   Time 6   Period Weeks   Status On-going  10/04/15:  100* with no pain, but min compensation/IR   OT SHORT TERM GOAL #3   Title Pt will demo ability to use LUE as stabilizer for ADLs.   Baseline unable   Time 6   Period Weeks   Status On-going  10/04/15:  inconsistent   OT SHORT TERM GOAL #4   Title Pt will demo ability to pick up 1inch object using tip pinch in 4/5 trials.   Baseline uses gross grasp or lateral pinch with difficulty   Time 6   Period Weeks   Status Achieved  10/04/15 met   OT SHORT TERM GOAL #5   Title Pt will be able to grasp/release cylinder object mod I in 4/5 trials.   Baseline min A   Time 6   Period Weeks   Status Achieved  09/27/15           OT Long Term Goals - 09/25/15 1119    OT LONG TERM GOAL #1   Title Pt will be independent with initial HEP.--check STGs 11/20/15   Baseline dependent   Time 12   Period Weeks   Status On-going   OT LONG TERM GOAL #2   Title Pt will demo at least 120* L shoulder flexion for functional reaching/ADLs without pain.   Baseline 90*   Time 12   Period Weeks   Status On-going   OT LONG TERM GOAL #3   Title Pt will use LUE as gross assist for ADLs at least 75% of the time.   Baseline unable   Time 12   Period Weeks   Status On-going   OT LONG TERM GOAL #4   Title Pt will demo at least 105* L shoulder abduction for ADLs without pain.   Baseline 90*   Time 12   Period Weeks   Status On-going   OT LONG TERM GOAL #5   Title Pt will improve LUE coordination as shown by scoring at least 5 on box and blocks test.   Baseline unable   Time 12   Period Weeks   Status On-going               Plan - 10/22/15 1132    Clinical Impression Statement Pt is progressing towards goals with improving LUE AAROM in higher ranges without pain.  Pt also with improved functional grasp/release and low-range reach with less compensation.   Plan neuro re-ed, LUE functional use   OT Home  Exercise Plan Education issued:  initial HEP 08/28/15; additional HEP 09/11/15, 09/13/15 wrist cock up brace and functional reach Marvin and Agree with Plan of Care Patient        Problem List Patient Active Problem List   Diagnosis Date Noted  . Chronic tension-type  headache, not intractable 10/18/2015  . Gait disturbance, post-stroke 08/14/2015  . Frozen shoulder syndrome 08/14/2015  . Subacromial impingement of left shoulder 08/14/2015  . Left shoulder pain 08/10/2015  . Diabetes type 2, controlled ([place]) 07/31/2015  . Eczema 07/31/2015  . Right middle cerebral artery stroke ([place]) 06/14/2015  . Left hemiparesis ([place])   . Left-sided neglect   . Embolic stroke of right basal ganglia ([place]) 06/11/2015  . TIA (transient ischemic attack) 06/09/2013  . Hypokalemia 06/09/2013  . PFO (patent foramen ovale) 06/09/2013  . Hyperlipidemia 12/10/2009  . Essential hypertension 12/10/2009  . Asthma 12/10/2009  . CEREBROVASCULAR ACCIDENT, HX OF 12/10/2009    [facility] 10/22/2015, 11:43 AM  [facility] [address] [place], [place], [address] Phone: [phone]   Fax:  [phone]  Name: Rebecca Yoder MRN: [id number] Date of Birth: 1966/02/20  Vianne Bulls, OTR/L [facility] [address] [place], [place]  [address] [phone] phone [phone] 10/22/2015 11:43 AM

## 2015-10-22 NOTE — Patient Instructions (Addendum)
Bridging    Keep Left foot closer to buttocks.  Slowly raise buttocks from floor, keeping stomach tight. Repeat _10___ times per set. Do _2_ sets per session. Do _1-2___ sessions per day.  http://orth.exer.us/1097   Copyright  VHI. All rights reserved.  HIP: Flexion / KNEE: Extension, Straight Leg Raise    Raise leg, keeping knee straight. Perform slowly. _10__ reps per set, _2__ sets per day.  Make sure you push your knee straight each time when you lower it to the bed. Copyright  VHI. All rights reserved.  HIP / KNEE: Flexion, Heel Slides - Supine    Practice quick coordinated movement pulling knee to chest and then back down in full extension.  4x5 1-2x/day. Copyright  VHI. All rights reserved.  Leg Curl: Prone (Single Leg)    Lying on stomach, Bend same knee up. Repeat _10_ times per set. Repeat with other leg. Do _2_ sets per session. http://tub.exer.us/200   Copyright  VHI. All rights reserved.  Functional Quadriceps: Sit to Stand    Sit on edge of chair, feet flat on floor. Stand upright, extending knees fully. Repeat ____ times per set. Do ____ sets per session. Do ____ sessions per day.  http://orth.exer.us/735   Copyright  VHI. All rights reserved.

## 2015-10-24 ENCOUNTER — Encounter: Payer: Medicaid Other | Admitting: Occupational Therapy

## 2015-10-24 ENCOUNTER — Ambulatory Visit: Payer: Medicaid Other | Admitting: Physical Therapy

## 2015-10-25 ENCOUNTER — Ambulatory Visit: Payer: Medicaid Other | Admitting: Physical Therapy

## 2015-10-25 ENCOUNTER — Encounter: Payer: Self-pay | Admitting: Physical Therapy

## 2015-10-25 ENCOUNTER — Ambulatory Visit: Payer: Medicaid Other | Admitting: Occupational Therapy

## 2015-10-25 VITALS — BP 148/90

## 2015-10-25 DIAGNOSIS — R279 Unspecified lack of coordination: Secondary | ICD-10-CM

## 2015-10-25 DIAGNOSIS — G8194 Hemiplegia, unspecified affecting left nondominant side: Secondary | ICD-10-CM

## 2015-10-25 DIAGNOSIS — R252 Cramp and spasm: Secondary | ICD-10-CM

## 2015-10-25 DIAGNOSIS — M25512 Pain in left shoulder: Secondary | ICD-10-CM

## 2015-10-25 DIAGNOSIS — R269 Unspecified abnormalities of gait and mobility: Secondary | ICD-10-CM

## 2015-10-25 DIAGNOSIS — M6281 Muscle weakness (generalized): Secondary | ICD-10-CM

## 2015-10-25 DIAGNOSIS — R2681 Unsteadiness on feet: Secondary | ICD-10-CM

## 2015-10-25 DIAGNOSIS — R278 Other lack of coordination: Secondary | ICD-10-CM

## 2015-10-25 DIAGNOSIS — R293 Abnormal posture: Secondary | ICD-10-CM

## 2015-10-25 NOTE — Therapy (Signed)
[facility] [address] [place], [place], [address] Phone: [phone]   Fax:  [phone]  Occupational Therapy Treatment  Patient Details  Name: Rebecca Yoder MRN: [id number] Date of Birth: November 02, 1965 Referring Provider: Dr. Alysia Penna  Encounter Date: 10/25/2015      OT End of Session - 10/25/15 1204    Visit Number 13   Number of Visits 19   Date for OT Re-Evaluation 10/23/15   Authorization Type was Medicaid pending--pt did not want to wait to schedule; now approved for Medicaid   Authorization Time Period CCME APPROVED 12 OT VISITS FROM 2/23-5/17/17   Authorization - Visit Number 6   Authorization - Number of Visits 12   OT Start Time 1110   OT Stop Time 1152   OT Time Calculation (min) 42 min   Activity Tolerance Patient tolerated treatment well      Past Medical History  Diagnosis Date  . ASTHMA 12/10/2009  . CEREBROVASCULAR ACCIDENT, HX OF 12/10/2009  . HYPERLIPIDEMIA 12/10/2009  . HYPERTENSION 12/10/2009  . Complication of anesthesia     "just can't get me up" (06/09/2013)  . Stroke [facility]) 2008    denies residual on 06/09/2013  . Tendonitis of wrist, right   . Embolic stroke of right basal ganglia ([place]) 06/11/2015    with L residual weakness/hemiparesis     Past Surgical History  Procedure Laterality Date  . Ovarian cyst removal  1990's    There were no vitals filed for this visit.  Visit Diagnosis:  Left hemiparesis ([place])  Left shoulder pain  Spasticity      Subjective Assessment - 10/25/15 1114    Subjective  I don't have any pain in my arm right now   Pertinent History hx of multiple CVAs (pt reports 06/11/15 CVA was the 4th),was independent prior to 06/11/15 CVA (living alone, taking care of mother who has CA, driving, working, and using LUE functionally), now living with sister/brother in-law who works a lot   Limitations pt reports no significant deficits from first 3 CVAs   Patient Stated Goals improve LUE functional use, incr ADL independence   Currently in Pain? No/denies                      OT Treatments/Exercises (OP) - 10/25/15 0001    Neurological Re-education Exercises   Other Exercises 1 Seated: AA/ROM for higher shoulder flex/ext with UE ranger and mod cueing and occasional min assist for proper positioning and full elbow extension. Bilateral shoulder flexion/ext to eye level with cane with min cues for elbow extension. Supine: bilateral shoulder flex/ext with ball to maintain shoulder and full arm alignment, but required mod facilitation for ER at shoulder and neutral forearm rotation (pt going into pronation, IR and abd). Pt also had pain initially at shoulder with descension but assisted with extension and no longer had pain   Other Exercises 2 Low to mid level open chain reaching to grasp/move/release cones with wrist brace on for wrist support and mod assist/facilitation at elbow to prevent shoulder compensations                  OT Short Term Goals - 10/15/15 1332    OT SHORT TERM GOAL #1   Title Pt will be independent with initial HEP.--check STGs 10/09/15   Baseline dependent   Time 6   Period Weeks   Status Achieved   OT SHORT TERM GOAL #2   Title  Pt will demo at least 110* L shoulder flexion for functional reaching/ADLs without pain.   Baseline 90*   Time 6   Period Weeks   Status On-going  10/04/15:  100* with no pain, but min compensation/IR   OT SHORT TERM GOAL #3   Title Pt will demo ability to use LUE as stabilizer for ADLs.   Baseline unable   Time 6   Period Weeks   Status On-going  10/04/15:  inconsistent   OT SHORT TERM GOAL #4   Title Pt will demo ability to pick up 1inch object using tip pinch in 4/5 trials.   Baseline uses gross grasp or lateral pinch with difficulty   Time 6   Period Weeks   Status Achieved  10/04/15 met   OT SHORT TERM GOAL #5   Title Pt will be able to grasp/release cylinder  object mod I in 4/5 trials.   Baseline min A   Time 6   Period Weeks   Status Achieved  09/27/15           OT Long Term Goals - 09/25/15 1119    OT LONG TERM GOAL #1   Title Pt will be independent with initial HEP.--check STGs 11/20/15   Baseline dependent   Time 12   Period Weeks   Status On-going   OT LONG TERM GOAL #2   Title Pt will demo at least 120* L shoulder flexion for functional reaching/ADLs without pain.   Baseline 90*   Time 12   Period Weeks   Status On-going   OT LONG TERM GOAL #3   Title Pt will use LUE as gross assist for ADLs at least 75% of the time.   Baseline unable   Time 12   Period Weeks   Status On-going   OT LONG TERM GOAL #4   Title Pt will demo at least 105* L shoulder abduction for ADLs without pain.   Baseline 90*   Time 12   Period Weeks   Status On-going   OT LONG TERM GOAL #5   Title Pt will improve LUE coordination as shown by scoring at least 5 on box and blocks test.   Baseline unable   Time 12   Period Weeks   Status On-going               Plan - 10/25/15 1205    Clinical Impression Statement Pt progressing with LUE use, but still requires min assist and cueing to prevent compensations.    Plan continue NMR and LUE functional use   OT Home Exercise Plan Education issued:  initial HEP 08/28/15; additional HEP 09/11/15, 09/13/15 wrist cock up brace and functional reach [place] and Agree with Plan of Care Patient        Problem List Patient Active Problem List   Diagnosis Date Noted  . Chronic tension-type headache, not intractable 10/18/2015  . Gait disturbance, post-stroke 08/14/2015  . Frozen shoulder syndrome 08/14/2015  . Subacromial impingement of left shoulder 08/14/2015  . Left shoulder pain 08/10/2015  . Diabetes type 2, controlled ([place]) 07/31/2015  . Eczema 07/31/2015  . Right middle cerebral artery stroke ([place]) 06/14/2015  . Left hemiparesis ([place])   . Left-sided neglect   . Embolic stroke of  right basal ganglia ([place]) 06/11/2015  . TIA (transient ischemic attack) 06/09/2013  . Hypokalemia 06/09/2013  . PFO (patent foramen ovale) 06/09/2013  . Hyperlipidemia 12/10/2009  . Essential hypertension 12/10/2009  . Asthma 12/10/2009  .  CEREBROVASCULAR ACCIDENT, HX OF 12/10/2009    Carey Bullocks, OTR/L 10/25/2015, 12:06 PM  [facility] [address] [place], [place], [address] Phone: [phone]   Fax:  [phone]  Name: Rebecca Yoder MRN: [id number] Date of Birth: November 20, 1965

## 2015-10-25 NOTE — Therapy (Signed)
[facility] [address] [place], [place], [address] Phone: [phone]   Fax:  [phone]  Physical Therapy Treatment  Patient Details  Name: Rebecca Yoder MRN: [id number] Date of Birth: Mar 29, 1966 Referring Provider: Letta Pate  Encounter Date: 10/25/2015      PT End of Session - 10/25/15 1208    Visit Number 3   Number of Visits 13   Date for PT Re-Evaluation 12/05/15   Authorization Type Medicaid-12 visits requested   PT Start Time 1017   PT Stop Time 1057   PT Time Calculation (min) 40 min   Activity Tolerance Patient tolerated treatment well   Behavior During Therapy [facility] for tasks assessed/performed      Past Medical History  Diagnosis Date  . ASTHMA 12/10/2009  . CEREBROVASCULAR ACCIDENT, HX OF 12/10/2009  . HYPERLIPIDEMIA 12/10/2009  . HYPERTENSION 12/10/2009  . Complication of anesthesia     "just can't get me up" (06/09/2013)  . Stroke [facility]) 2008    denies residual on 06/09/2013  . Tendonitis of wrist, right   . Embolic stroke of right basal ganglia ([place]) 06/11/2015    with L residual weakness/hemiparesis     Past Surgical History  Procedure Laterality Date  . Ovarian cyst removal  1990's    Filed Vitals:   10/25/15 1028  BP: 148/90    Visit Diagnosis:  Left hemiparesis ([facility])  Decreased coordination  Muscle weakness of lower extremity  Abnormality of gait  Postural instability  Unsteadiness      Subjective Assessment - 10/25/15 1028    Subjective Pt went to PCP last Thursday and BP was high.  PCP adjusted medication.    Patient Stated Goals Pt's goal for therapy is to walk better and use hand better.   Currently in Pain? No/denies   Pain Onset More than a month ago                         [facility] Adult PT Treatment/Exercise - 10/25/15 0001    Ambulation/Gait   Ambulation/Gait Yes   Ambulation/Gait Assistance 4: Min guard   Ambulation Distance (Feet) 230 Feet   Working on initial R heelstrike and increasing R stridelengt   Assistive device Rolling walker   Gait Pattern Step-to pattern;Decreased step length - right;Decreased step length - left;Decreased stance time - left;Decreased hip/knee flexion - left;Decreased dorsiflexion - left;Decreased weight shift to left;Left foot flat;Trunk rotated posteriorly on left;Poor foot clearance - left   Ambulation Surface Level             Balance Exercises - 10/25/15 1033    Balance Exercises: Standing   Sit to Stand  x10 Min A cuse to wt shift to the left for greater wt bearing.       NMR: Tall kneel to work on balance, trunk control and LLE strengthening BIL UE raises Upper trunk rotations Sidestepping Cues for posture and overall Min A to perform.        PT Education - 10/25/15 1206    Education provided Yes   Education Details Gait mechanics with ambulation and balance/strengthening strategies with sit to stands and tall kneel activities   Person(s) Educated Patient   Methods Explanation;Demonstration;Tactile cues;Verbal cues   Comprehension Verbalized understanding;Returned demonstration          PT Short Term Goals - 10/05/15 1203    PT SHORT TERM GOAL #1   Title Pt will be able to perform HEP with  family supervision for improved balance, strength, gait.  TARGET 12/03/15   Baseline No current HEP for lower extremity strength/balance   Time 4   Period Weeks   Status New   PT SHORT TERM GOAL #2   Title Pt will perform at least 6 of 10 reps of sit<>stand transfers with minimal UE support for improved efficiency and safety with gait.   Baseline Supervision for transfers with definite UE support   Time 4   Period Weeks   Status New   PT SHORT TERM GOAL #3   Title Pt will improve TUG score to less than or equal to 38 seconds for decreased fall risk.   Baseline TUG 44.50 sec with RW   Time 4   Period Weeks   Status New   PT SHORT TERM GOAL #4   Title Pt will improve Berg Balance  score to at least 27/56 for decreased fall risk.   Baseline Berg scores 22/56 (scores <45/56 indicate increased fall risk.)   Time 4   Period Weeks   Status New           PT Long Term Goals - 10/05/15 1207    PT LONG TERM GOAL #1   Title Pt will verbalize understanding of fall prevention within the home environment.  TARGET 12/05/15   Baseline at fall risk per Merrilee Jansky, TUG scores   Time 8   Period Weeks   Status New   PT LONG TERM GOAL #2   Title Pt will improve gait velocity to at least 1.2 ft/sec for improved gait efficiency and safety.   Baseline gait velocity 0.85 ft/sec   Time 8   Period Weeks   Status New   PT LONG TERM GOAL #3   Title Pt will improve TUG score to less than or equal to 30 seconds for decreased fall risk.   Baseline TUG 44.50 sec   Time 8   Period Weeks   Status New   PT LONG TERM GOAL #4   Title Pt will improve Berg Balance score to at least 32/56 for decreased fall risk.   Baseline Berg score 22/56   Time 8   Period Weeks   Status New   PT LONG TERM GOAL #5   Title Pt will verbalize understanding of community fitness upon D/C from PT.   Baseline no current formal HEP/community fitness   Time 8   Period Weeks   Status New               Plan - 10/25/15 1209    Clinical Impression Statement Skilled session focused on addressing questions about HEP and working in sit to stand technique and tall kneel activities for balance and strengthening and gait training for R LE mechanics.  Pt is making gradual progress towards goals.   Pt will benefit from skilled therapeutic intervention in order to improve on the following deficits Abnormal gait;Decreased balance;Decreased mobility;Decreased range of motion;Decreased strength;Difficulty walking;Impaired tone;Pain;Postural dysfunction   Rehab Potential Good   PT Frequency --  12 visits over 12 weeks   PT Duration 12 weeks   PT Treatment/Interventions ADLs/Self Care Home Management;Electrical  Stimulation;Functional mobility training;Therapeutic activities;Therapeutic exercise;Gait training;DME Instruction;Balance training;Neuromuscular re-education;Patient/family education;Orthotic Fit/Training   PT Next Visit Plan  LLE strengthening increased weigthshifting/weightbearing on LLE during transfers; work on gait training to improve step through pattern with gait using RW   Consulted and Agree with Plan of Care Patient        Problem List Patient  Active Problem List   Diagnosis Date Noted  . Chronic tension-type headache, not intractable 10/18/2015  . Gait disturbance, post-stroke 08/14/2015  . Frozen shoulder syndrome 08/14/2015  . Subacromial impingement of left shoulder 08/14/2015  . Left shoulder pain 08/10/2015  . Diabetes type 2, controlled ([place]) 07/31/2015  . Eczema 07/31/2015  . Right middle cerebral artery stroke ([place]) 06/14/2015  . Left hemiparesis ([place])   . Left-sided neglect   . Embolic stroke of right basal ganglia ([place]) 06/11/2015  . TIA (transient ischemic attack) 06/09/2013  . Hypokalemia 06/09/2013  . PFO (patent foramen ovale) 06/09/2013  . Hyperlipidemia 12/10/2009  . Essential hypertension 12/10/2009  . Asthma 12/10/2009  . CEREBROVASCULAR ACCIDENT, HX OF 12/10/2009   Bjorn Loser, PTA  10/25/2015, 12:13 PM [facility] [address] [place], [place], [address] Phone: [phone]   Fax:  [phone]  Name: Rebecca Yoder MRN: [id number] Date of Birth: 27-Jun-1966

## 2015-10-25 NOTE — Patient Instructions (Signed)
NMR: Tall kneel to work on balance, trunk control and LLE strengthening BIL UE raises Upper trunk rotations Sidestepping.

## 2015-10-26 ENCOUNTER — Ambulatory Visit: Payer: Medicaid Other | Admitting: Occupational Therapy

## 2015-10-29 ENCOUNTER — Encounter: Payer: Self-pay | Admitting: Physical Therapy

## 2015-10-29 ENCOUNTER — Ambulatory Visit: Payer: Medicaid Other | Admitting: Occupational Therapy

## 2015-10-29 ENCOUNTER — Ambulatory Visit: Payer: Medicaid Other | Admitting: Physical Therapy

## 2015-10-29 DIAGNOSIS — R252 Cramp and spasm: Secondary | ICD-10-CM

## 2015-10-29 DIAGNOSIS — G8194 Hemiplegia, unspecified affecting left nondominant side: Secondary | ICD-10-CM

## 2015-10-29 DIAGNOSIS — M25512 Pain in left shoulder: Secondary | ICD-10-CM

## 2015-10-29 DIAGNOSIS — M2560 Stiffness of unspecified joint, not elsewhere classified: Secondary | ICD-10-CM

## 2015-10-29 DIAGNOSIS — R279 Unspecified lack of coordination: Secondary | ICD-10-CM

## 2015-10-29 DIAGNOSIS — M25612 Stiffness of left shoulder, not elsewhere classified: Secondary | ICD-10-CM

## 2015-10-29 DIAGNOSIS — R278 Other lack of coordination: Secondary | ICD-10-CM

## 2015-10-29 DIAGNOSIS — M6281 Muscle weakness (generalized): Secondary | ICD-10-CM

## 2015-10-29 DIAGNOSIS — R293 Abnormal posture: Secondary | ICD-10-CM

## 2015-10-29 DIAGNOSIS — R269 Unspecified abnormalities of gait and mobility: Secondary | ICD-10-CM

## 2015-10-29 DIAGNOSIS — R2681 Unsteadiness on feet: Secondary | ICD-10-CM

## 2015-10-29 NOTE — Therapy (Signed)
[facility] [address] [place], [place], [address] Phone: [phone]   Fax:  [phone]  Occupational Therapy Treatment  Patient Details  Name: Rebecca Yoder MRN: [id number] Date of Birth: May 19, 1966 Referring Provider: Dr. Alysia Penna  Encounter Date: 10/29/2015      OT End of Session - 10/29/15 1410    Visit Number 14   Number of Visits 19   Date for OT Re-Evaluation 10/23/15   Authorization Type was Medicaid pending--pt did not want to wait to schedule; now approved for Medicaid   Authorization Time Period CCME APPROVED 12 OT VISITS FROM 2/23-5/17/17   Authorization - Visit Number 7   Authorization - Number of Visits 12   OT Start Time 6384   OT Stop Time 1445   OT Time Calculation (min) 40 min   Activity Tolerance Patient tolerated treatment well   Behavior During Therapy [facility] for tasks assessed/performed      Past Medical History  Diagnosis Date  . ASTHMA 12/10/2009  . CEREBROVASCULAR ACCIDENT, HX OF 12/10/2009  . HYPERLIPIDEMIA 12/10/2009  . HYPERTENSION 12/10/2009  . Complication of anesthesia     "just can't get me up" (06/09/2013)  . Stroke [facility]) 2008    denies residual on 06/09/2013  . Tendonitis of wrist, right   . Embolic stroke of right basal ganglia ([place]) 06/11/2015    with L residual weakness/hemiparesis     Past Surgical History  Procedure Laterality Date  . Ovarian cyst removal  1990's    There were no vitals filed for this visit.  Visit Diagnosis:  Left hemiparesis ([place])  Left shoulder pain  Spasticity  Decreased coordination  Shoulder stiffness, left      Subjective Assessment - 10/29/15 1441    Subjective  "I've been working to stretch my elbow out.   Pertinent History hx of multiple CVAs (pt reports 06/11/15 CVA was the 4th),was independent prior to 06/11/15 CVA (living alone, taking care of mother who has CA, driving, working, and using LUE functionally), now living  with sister/brother in-law who works a lot   Patient Stated Goals improve LUE functional use, incr ADL independence          OT Treatments/Exercises (OP) - 10/15/15 0001    Neurological Re-education Exercises   Shoulder Flexion AAROM;Both;10 reps; supine, Seated  with PVC frame with min-mod cueingfor compensation       Elbow Extension AAROM chest press with PVC frame with and without place and holds with min facilitation for full elbow extension    Other Grasp and Release Exercises  Low-mid range functional reaching to grasp/release cylinder objects with min cueing/facilitation for compensation and min difficulty.  Low-mid range functional reaching to grasp blocks with fingertips as and place in containers with min cueing for compensatuib,    Sitting with weight on hand with min-mod cues, min facilitation for body on arm movements for incr shoulder stabilization, tricep activation (improved today)                                             OT Short Term Goals - 10/29/15 1436    OT SHORT TERM GOAL #1   Title Pt will be independent with initial HEP.--check STGs 10/09/15   Baseline dependent   Time 6   Period Weeks   Status Achieved   OT SHORT TERM GOAL #2  Title Pt will demo at least 110* L shoulder flexion for functional reaching/ADLs without pain.   Baseline 90*   Time 6   Period Weeks   Status On-going  10/04/15:  100* with no pain, but min compensation/IR   OT SHORT TERM GOAL #3   Title Pt will demo ability to use LUE as stabilizer for ADLs.   Baseline unable   Time 6   Period Weeks   Status Achieved  10/04/15:  inconsistent; 10/29/15   OT SHORT TERM GOAL #4   Title Pt will demo ability to pick up 1inch object using tip pinch in 4/5 trials.   Baseline uses gross grasp or lateral pinch with difficulty   Time 6   Period Weeks   Status Achieved  10/04/15 met   OT SHORT TERM GOAL #5   Title Pt will be able to  grasp/release cylinder object mod I in 4/5 trials.   Baseline min A   Time 6   Period Weeks   Status Achieved  09/27/15           OT Long Term Goals - 09/25/15 1119    OT LONG TERM GOAL #1   Title Pt will be independent with initial HEP.--check STGs 11/20/15   Baseline dependent   Time 12   Period Weeks   Status On-going   OT LONG TERM GOAL #2   Title Pt will demo at least 120* L shoulder flexion for functional reaching/ADLs without pain.   Baseline 90*   Time 12   Period Weeks   Status On-going   OT LONG TERM GOAL #3   Title Pt will use LUE as gross assist for ADLs at least 75% of the time.   Baseline unable   Time 12   Period Weeks   Status On-going   OT LONG TERM GOAL #4   Title Pt will demo at least 105* L shoulder abduction for ADLs without pain.   Baseline 90*   Time 12   Period Weeks   Status On-going   OT LONG TERM GOAL #5   Title Pt will improve LUE coordination as shown by scoring at least 5 on box and blocks test.   Baseline unable   Time 12   Period Weeks   Status On-going               Plan - 10/29/15 1438    Clinical Impression Statement Pt progressing with LUE functional use, but continues to demo decr control and compensations patterns and needs cues/facilitation to correct.   Plan continue with neuro re-ed, LUE functional use   Consulted and Agree with Plan of Care Patient        Problem List Patient Active Problem List   Diagnosis Date Noted  . Chronic tension-type headache, not intractable 10/18/2015  . Gait disturbance, post-stroke 08/14/2015  . Frozen shoulder syndrome 08/14/2015  . Subacromial impingement of left shoulder 08/14/2015  . Left shoulder pain 08/10/2015  . Diabetes type 2, controlled ([place]) 07/31/2015  . Eczema 07/31/2015  . Right middle cerebral artery stroke ([place]) 06/14/2015  . Left hemiparesis ([place])   . Left-sided neglect   . Embolic stroke of right basal ganglia ([place]) 06/11/2015  . TIA (transient ischemic  attack) 06/09/2013  . Hypokalemia 06/09/2013  . PFO (patent foramen ovale) 06/09/2013  . Hyperlipidemia 12/10/2009  . Essential hypertension 12/10/2009  . Asthma 12/10/2009  . CEREBROVASCULAR ACCIDENT, HX OF 12/10/2009    [facility] 10/29/2015, 2:41 PM  [facility]  [facility] [address] [place], [place], [address] Phone: [phone]   Fax:  [phone]  Name: Rebecca Yoder MRN: [id number] Date of Birth: [birth year]  Vianne Bulls, OTR/L [facility] [address] [place], [place]  [address] [phone] phone [phone] 10/29/2015 2:41 PM

## 2015-10-29 NOTE — Therapy (Signed)
[facility] [address] [place], [place], [address] Phone: [phone]   Fax:  [phone]  Physical Therapy Treatment  Patient Details  Name: MICKAYLA HORTIN MRN: [id number] Date of Birth: [birth year] Referring Provider: Letta Pate  Encounter Date: 10/29/2015      PT End of Session - 10/29/15 1545    Visit Number 4   Number of Visits 13   Date for PT Re-Evaluation 12/05/15   Authorization Type Medicaid-12 visits requested   PT Start Time [id number]   PT Stop Time 1540   PT Time Calculation (min) 47 min   Equipment Utilized During Treatment Gait belt   Behavior During Therapy [facility] for tasks assessed/performed      Past Medical History  Diagnosis Date  . ASTHMA 12/10/2009  . CEREBROVASCULAR ACCIDENT, HX OF 12/10/2009  . HYPERLIPIDEMIA 12/10/2009  . HYPERTENSION 12/10/2009  . Complication of anesthesia     "just can't get me up" (06/09/2013)  . Stroke [facility]) 2008    denies residual on 06/09/2013  . Tendonitis of wrist, right   . Embolic stroke of right basal ganglia ([place]) 06/11/2015    with L residual weakness/hemiparesis     Past Surgical History  Procedure Laterality Date  . Ovarian cyst removal  1990's    There were no vitals filed for this visit.  Visit Diagnosis:  Left hemiparesis (HCC)  Decreased coordination  Muscle weakness of lower extremity  Abnormality of gait  Postural instability  Unsteadiness  Joint stiffness      Subjective Assessment - 10/29/15 1504    Subjective Getting better at HEP   Currently in Pain? No/denies                         [facility] Adult PT Treatment/Exercise - 10/29/15 0001    Transfers   Transfers Stand Pivot Transfers   Transfer Cueing working on safe technique and practising balance with less UE support.  supervision level.   Ambulation/Gait   Ambulation/Gait Yes   Ambulation/Gait Assistance 4: Min guard   Ambulation Distance (Feet) 500 Feet   Assistive device Rolling walker   Gait Pattern Step-through pattern   Ambulation Surface Level   Gait Comments Working in greater gait efficiency with longer R steplength  and heelstrike.  Cues for technique   Knee/Hip Exercises: Aerobic   Other Aerobic Sci fit stepper L= 3 61min             Balance Exercises - 10/29/15 1525    Balance Exercises: Standing   Standing Eyes Opened Wide (BOA)  Multi level reaching working on shifting to L LE    Standing Eyes Closed Wide (BOA);Head turns;10 secs           PT Education - 10/29/15 1544    Education provided Yes   Education Details Gait mechanics, standing balance strategies and LE strengthening   Person(s) Educated Patient   Methods Explanation;Demonstration;Tactile cues;Verbal cues   Comprehension Verbalized understanding;Returned demonstration;Verbal cues required;Need further instruction          PT Short Term Goals - 10/05/15 1203    PT SHORT TERM GOAL #1   Title Pt will be able to perform HEP with family supervision for improved balance, strength, gait.  TARGET 12/03/15   Baseline No current HEP for lower extremity strength/balance   Time 4   Period Weeks   Status New   PT SHORT TERM GOAL #2   Title Pt will perform at  least 6 of 10 reps of sit<>stand transfers with minimal UE support for improved efficiency and safety with gait.   Baseline Supervision for transfers with definite UE support   Time 4   Period Weeks   Status New   PT SHORT TERM GOAL #3   Title Pt will improve TUG score to less than or equal to 38 seconds for decreased fall risk.   Baseline TUG 44.50 sec with RW   Time 4   Period Weeks   Status New   PT SHORT TERM GOAL #4   Title Pt will improve Berg Balance score to at least 27/56 for decreased fall risk.   Baseline Berg scores 22/56 (scores <45/56 indicate increased fall risk.)   Time 4   Period Weeks   Status New           PT Long Term Goals - 10/05/15 1207    PT LONG TERM GOAL #1    Title Pt will verbalize understanding of fall prevention within the home environment.  TARGET 12/05/15   Baseline at fall risk per Merrilee Jansky, TUG scores   Time 8   Period Weeks   Status New   PT LONG TERM GOAL #2   Title Pt will improve gait velocity to at least 1.2 ft/sec for improved gait efficiency and safety.   Baseline gait velocity 0.85 ft/sec   Time 8   Period Weeks   Status New   PT LONG TERM GOAL #3   Title Pt will improve TUG score to less than or equal to 30 seconds for decreased fall risk.   Baseline TUG 44.50 sec   Time 8   Period Weeks   Status New   PT LONG TERM GOAL #4   Title Pt will improve Berg Balance score to at least 32/56 for decreased fall risk.   Baseline Berg score 22/56   Time 8   Period Weeks   Status New   PT LONG TERM GOAL #5   Title Pt will verbalize understanding of community fitness upon D/C from PT.   Baseline no current formal HEP/community fitness   Time 8   Period Weeks   Status New               Plan - 10/29/15 1511    Clinical Impression Statement Skilled session focused on LE strengthening/endurance, gait mechanics (pt was able to follow through with increased R step length and initial heelstrike) and standing balance (worked without UE support on wt shifting and transfers). Progressing well towards goals.                                           Pt will benefit from skilled therapeutic intervention in order to improve on the following deficits Abnormal gait;Decreased balance;Decreased mobility;Decreased range of motion;Decreased strength;Difficulty walking;Impaired tone;Pain;Postural dysfunction   Rehab Potential Good   PT Frequency --  12 visits over 12 weeks   PT Duration 12 weeks   PT Treatment/Interventions ADLs/Self Care Home Management;Electrical Stimulation;Functional mobility training;Therapeutic activities;Therapeutic exercise;Gait training;DME Instruction;Balance training;Neuromuscular re-education;Patient/family  education;Orthotic Fit/Training   PT Next Visit Plan  gait trial SPC, standing balance, and LLE strengthening.   Consulted and Agree with Plan of Care Patient        Problem List Patient Active Problem List   Diagnosis Date Noted  . Chronic tension-type headache, not intractable 10/18/2015  . Gait  disturbance, post-stroke 08/14/2015  . Frozen shoulder syndrome 08/14/2015  . Subacromial impingement of left shoulder 08/14/2015  . Left shoulder pain 08/10/2015  . Diabetes type 2, controlled ([place]) 07/31/2015  . Eczema 07/31/2015  . Right middle cerebral artery stroke ([place]) 06/14/2015  . Left hemiparesis ([place])   . Left-sided neglect   . Embolic stroke of right basal ganglia ([place]) 06/11/2015  . TIA (transient ischemic attack) 06/09/2013  . Hypokalemia 06/09/2013  . PFO (patent foramen ovale) 06/09/2013  . Hyperlipidemia 12/10/2009  . Essential hypertension 12/10/2009  . Asthma 12/10/2009  . CEREBROVASCULAR ACCIDENT, HX OF 12/10/2009    Bjorn Loser, PTA  10/29/2015, 3:55 PM [facility] [address] [place], [place], [address] Phone: [phone]   Fax:  [phone]  Name: VERGIL GOSCH MRN: [id number] Date of Birth: March 14, 1966

## 2015-10-30 MED FILL — LOSARTAN POTASSIUM 100 MG T: 100 | 30 days supply | Qty: 30 | Fill #3

## 2015-10-30 MED FILL — CLOPIDOGREL 75 MG TABLET: 75 | 30 days supply | Qty: 30 | Fill #2

## 2015-10-30 MED FILL — PRAVASTATIN NA 20 MG TAB: 20 | 30 days supply | Qty: 30 | Fill #1

## 2015-10-30 MED FILL — PANTOPRAZOLE SOD DR 40 MG T: 40 | 30 days supply | Qty: 30 | Fill #2

## 2015-11-01 ENCOUNTER — Ambulatory Visit: Payer: Medicaid Other | Admitting: Occupational Therapy

## 2015-11-01 ENCOUNTER — Ambulatory Visit: Payer: Medicaid Other | Admitting: Physical Therapy

## 2015-11-01 DIAGNOSIS — R252 Cramp and spasm: Secondary | ICD-10-CM

## 2015-11-01 DIAGNOSIS — M25612 Stiffness of left shoulder, not elsewhere classified: Secondary | ICD-10-CM

## 2015-11-01 DIAGNOSIS — R279 Unspecified lack of coordination: Secondary | ICD-10-CM

## 2015-11-01 DIAGNOSIS — G8194 Hemiplegia, unspecified affecting left nondominant side: Secondary | ICD-10-CM | POA: Diagnosis not present

## 2015-11-01 DIAGNOSIS — R278 Other lack of coordination: Secondary | ICD-10-CM

## 2015-11-01 DIAGNOSIS — M25512 Pain in left shoulder: Secondary | ICD-10-CM

## 2015-11-01 DIAGNOSIS — M6281 Muscle weakness (generalized): Secondary | ICD-10-CM

## 2015-11-01 DIAGNOSIS — R269 Unspecified abnormalities of gait and mobility: Secondary | ICD-10-CM

## 2015-11-01 NOTE — Therapy (Signed)
[facility] [address] [place], [place], [address] Phone: [phone]   Fax:  [phone]  Patient Details  Name: GEETHA HORNICK MRN: [id number] Date of Birth: Feb 07, 1966 Referring Provider:  Boykin Nearing, MD  Encounter Date: 11/01/2015   Pt's scheduled PT appointment not able to be completed as scheduled due to scheduled therapist out sick.  This therapist not able to see for complete 45 minute session, and pt opted not to count visit for shortened session.  Pt does perform SciFit, seated stepper, level 3 x 15 minutes at no charge.   Cassiopeia Florentino W. 11/01/2015, 11:01 AM  Frazier Butt., PT  [facility] [address] [place], [place], [address] Phone: [phone]   Fax:  [phone]

## 2015-11-01 NOTE — Therapy (Signed)
[facility] [address]  [place], [place], [address] Phone: [phone]   Fax:  [phone]  Occupational Therapy Treatment  Patient Details  Name: Rebecca Yoder MRN: [id number] Date of Birth: 06/30/1966 Referring Provider: Dr. Alysia Penna  Encounter Date: 11/01/2015      OT End of Session - 11/01/15 1112    Visit Number 15   Number of Visits 19   Date for OT Re-Evaluation 11/16/15   Authorization Type was Medicaid pending--pt did not want to wait to schedule; now approved for Medicaid   Authorization Time Period [employer] APPROVED 12 OT VISITS FROM 2/23-5/17/17   Authorization - Visit Number 8   Authorization - Number of Visits 12   OT Start Time 1106   OT Stop Time 1150   OT Time Calculation (min) 44 min   Activity Tolerance Patient tolerated treatment well   Behavior During Therapy [facility] for tasks assessed/performed      Past Medical History  Diagnosis Date  . ASTHMA 12/10/2009  . CEREBROVASCULAR ACCIDENT, HX OF 12/10/2009  . HYPERLIPIDEMIA 12/10/2009  . HYPERTENSION 12/10/2009  . Complication of anesthesia     "just can't get me up" (06/09/2013)  . Stroke [facility]) 2008    denies residual on 06/09/2013  . Tendonitis of wrist, right   . Embolic stroke of right basal ganglia ([place]) 06/11/2015    with L residual weakness/hemiparesis     Past Surgical History  Procedure Laterality Date  . Ovarian cyst removal  1990's    There were no vitals filed for this visit.  Visit Diagnosis:  Left hemiparesis ([place])  Spasticity  Left shoulder pain  Decreased coordination  Shoulder stiffness, left      Subjective Assessment - 11/01/15 1110    Subjective  Pt reports exercises are going well at home.   Pertinent History hx of multiple CVAs (pt reports 06/11/15 CVA was the 4th),was independent prior to 06/11/15 CVA (living alone, taking care of mother who has CA, driving, working, and using LUE functionally), now  living with sister/brother in-law who works a lot   Limitations pt reports no significant deficits from first 3 CVAs   Patient Stated Goals improve LUE functional use, incr ADL independence   Currently in Pain? No/denies            OT Treatments/Exercises (OP) - 10/15/15 0001    Neurological Re-education Exercises   Shoulder Flexion AAROM;Both;10 reps; supine, Seated  with PVC frame with min cueingfor compensation;  Self-PROM shoulder flex at beginning of session       Elbow Extension AAROM chest press with PVC frame with min cueing for full elbow extension in sitting and supine    Other Grasp and Release Exercises  Flipping large cards with min cueing/facilitation for compensation  Low-mid range functional reaching to pick up checkers and place in connect 4 slots with min-mod facilitation and  cueing for compensation     Sitting with weight on hand with min cues/facilitation for body on arm movements for incr shoulder stabilization, tricep activation (improved today)  Standing with hands on mat in modified quadraped with cat/cow positions for incr scapular/shoulder stability and mobility with min facilitation and min-mod cues.   Arm bike x106mn level 1 with L hand wrapped for reciprocal movements with min cueing for elbow extension.  OT Short Term Goals - 10/29/15 1436    OT SHORT TERM GOAL #1   Title Pt will be independent with initial HEP.--check STGs 10/09/15   Baseline dependent   Time 6   Period Weeks   Status Achieved   OT SHORT TERM GOAL #2   Title Pt will demo at least 110* L shoulder flexion for functional reaching/ADLs without pain.   Baseline 90*   Time 6   Period Weeks   Status On-going  10/04/15:  100* with no pain, but min compensation/IR   OT SHORT TERM GOAL #3   Title Pt will demo ability to use LUE as stabilizer for ADLs.   Baseline unable    Time 6   Period Weeks   Status Achieved  10/04/15:  inconsistent; 10/29/15   OT SHORT TERM GOAL #4   Title Pt will demo ability to pick up 1inch object using tip pinch in 4/5 trials.   Baseline uses gross grasp or lateral pinch with difficulty   Time 6   Period Weeks   Status Achieved  10/04/15 met   OT SHORT TERM GOAL #5   Title Pt will be able to grasp/release cylinder object mod I in 4/5 trials.   Baseline min A   Time 6   Period Weeks   Status Achieved  09/27/15           OT Long Term Goals - 09/25/15 1119    OT LONG TERM GOAL #1   Title Pt will be independent with initial HEP.--check STGs 11/20/15   Baseline dependent   Time 12   Period Weeks   Status On-going   OT LONG TERM GOAL #2   Title Pt will demo at least 120* L shoulder flexion for functional reaching/ADLs without pain.   Baseline 90*   Time 12   Period Weeks   Status On-going   OT LONG TERM GOAL #3   Title Pt will use LUE as gross assist for ADLs at least 75% of the time.   Baseline unable   Time 12   Period Weeks   Status On-going   OT LONG TERM GOAL #4   Title Pt will demo at least 105* L shoulder abduction for ADLs without pain.   Baseline 90*   Time 12   Period Weeks   Status On-going   OT LONG TERM GOAL #5   Title Pt will improve LUE coordination as shown by scoring at least 5 on box and blocks test.   Baseline unable   Time 12   Period Weeks   Status On-going               Plan - 11/01/15 1135    Clinical Impression Statement Pt demo incr elbow extension today with AAROM and is progressing towards goals.   Plan neuro re-ed, LUE functional use   OT Home Exercise Plan Education issued:  initial HEP 08/28/15; additional HEP 09/11/15, 09/13/15 wrist cock up brace and functional reach [place] and Agree with Plan of Care Patient        Problem List Patient Active Problem List   Diagnosis Date Noted  . Chronic tension-type headache, not intractable 10/18/2015  . Gait  disturbance, post-stroke 08/14/2015  . Frozen shoulder syndrome 08/14/2015  . Subacromial impingement of left shoulder 08/14/2015  . Left shoulder pain 08/10/2015  . Diabetes type 2, controlled ([place]) 07/31/2015  . Eczema 07/31/2015  . Right middle cerebral artery stroke ([place]) 06/14/2015  .  Left hemiparesis ([place])   . Left-sided neglect   . Embolic stroke of right basal ganglia ([place]) 06/11/2015  . TIA (transient ischemic attack) 06/09/2013  . Hypokalemia 06/09/2013  . PFO (patent foramen ovale) 06/09/2013  . Hyperlipidemia 12/10/2009  . Essential hypertension 12/10/2009  . Asthma 12/10/2009  . CEREBROVASCULAR ACCIDENT, HX OF 12/10/2009    [facility] 11/01/2015, 11:56 AM  [facility] [address] [place], [place], [address] Phone: [phone]   Fax:  [phone]  Name: JONIE BURDELL MRN: [id number] Date of Birth: April 22, 1966  Vianne Bulls, OTR/L [facility] [address] [place], [place]  [address] [phone] phone [phone] 11/01/2015 11:56 AM

## 2015-11-02 ENCOUNTER — Encounter: Payer: Self-pay | Admitting: Physical Medicine & Rehabilitation

## 2015-11-02 ENCOUNTER — Ambulatory Visit (HOSPITAL_BASED_OUTPATIENT_CLINIC_OR_DEPARTMENT_OTHER): Payer: Medicaid Other | Admitting: Physical Medicine & Rehabilitation

## 2015-11-02 ENCOUNTER — Encounter: Payer: Medicaid Other | Attending: Physical Medicine & Rehabilitation

## 2015-11-02 VITALS — BP 149/94 | HR 98 | Resp 14

## 2015-11-02 DIAGNOSIS — R269 Unspecified abnormalities of gait and mobility: Secondary | ICD-10-CM | POA: Diagnosis present

## 2015-11-02 DIAGNOSIS — M7542 Impingement syndrome of left shoulder: Secondary | ICD-10-CM | POA: Diagnosis not present

## 2015-11-02 DIAGNOSIS — S43002A Unspecified subluxation of left shoulder joint, initial encounter: Secondary | ICD-10-CM | POA: Diagnosis not present

## 2015-11-02 DIAGNOSIS — M7502 Adhesive capsulitis of left shoulder: Secondary | ICD-10-CM

## 2015-11-02 DIAGNOSIS — G8194 Hemiplegia, unspecified affecting left nondominant side: Secondary | ICD-10-CM | POA: Diagnosis present

## 2015-11-02 DIAGNOSIS — I69954 Hemiplegia and hemiparesis following unspecified cerebrovascular disease affecting left non-dominant side: Secondary | ICD-10-CM | POA: Insufficient documentation

## 2015-11-02 DIAGNOSIS — M25512 Pain in left shoulder: Secondary | ICD-10-CM | POA: Diagnosis not present

## 2015-11-02 DIAGNOSIS — Z8673 Personal history of transient ischemic attack (TIA), and cerebral infarction without residual deficits: Secondary | ICD-10-CM | POA: Diagnosis present

## 2015-11-02 DIAGNOSIS — G8110 Spastic hemiplegia affecting unspecified side: Secondary | ICD-10-CM | POA: Diagnosis not present

## 2015-11-02 NOTE — Patient Instructions (Signed)
You may call us back once to find out what type of you need.

## 2015-11-02 NOTE — Progress Notes (Signed)
   Subjective:    Patient ID: Rebecca Yoder, female    DOB: Apr 16, 1966, 50 y.o.   MRN: [id number]  HPI 50 year old female with history of recurrent stroke with left spastic hemiplegia. She underwent botulinum toxin injection 09/04/2015 100 units, 50 units into flexor digitorum longus 50 units into the left tibialis posterior. She still has some foot inversion but feels like it's a little bit better. The patient also has left shoulder pain. She is currently undergoing outpatient therapy. Her therapist told her that she has a frozen shoulder.   Review of Systems No new trauma to the left upper or left lower extremity.    Objective:   Physical Exam Gen. No acute distress Mood and affect are appropriate Left lower extremity has inversion accentuated with standing. She ambulance with AFO and a walker. No evidence toe drag  Left shoulder with decreased active and passive range of motion, mild subluxation noted.       Assessment & Plan:  1. Left spastic hemiplegia due to right CVA onset About 5 months ago She is unlikely to her migraine significant functional usage of left upper extremity but may use it as a gross assist. She also uses it on the walker.  She has complications of adhesive capsulitis causing chronic shoulder pain. We'll do shoulder injection palpation guided May need to repeat, if not very effective consider ultrasound-guided. Patient is requesting hydrocodone however given her fall risk but do not feel comfortable prescribing this medication. Her primary doctor is prescribing Tylenol with Codeine  In regards to her spasticity she would benefit from Botox repeat in 1 month, would increase dosage to 200 units in the left lower extremity.

## 2015-11-02 NOTE — Progress Notes (Signed)
Shoulder injection Left  Palpation guided  Ndication:Left frozen Shoulder pain not relieved by medication management and other conservative care.  Informed consent was obtained after describing risks and benefits of the procedure with the patient, this includes bleeding, bruising, infection and medication side effects. The patient wishes to proceed and has given written consent. Patient was placed in a seated position. The left shoulder was marked and prepped with betadine in the subacromial area. A 25-gauge 1-1/2 inch needle was inserted into the subacromial area. After negative draw back for blood, a solution containing 1 mL of 6 mg per ML betamethasone and 4 mL of 1% lidocaine was injected. A band aid was applied. The patient tolerated the procedure well. Post procedure instructions were given.

## 2015-11-05 ENCOUNTER — Encounter: Payer: Medicaid Other | Admitting: Occupational Therapy

## 2015-11-05 ENCOUNTER — Ambulatory Visit: Payer: Medicaid Other | Admitting: Physical Therapy

## 2015-11-07 ENCOUNTER — Ambulatory Visit: Payer: Medicaid Other | Admitting: Physical Therapy

## 2015-11-07 ENCOUNTER — Ambulatory Visit: Payer: Medicaid Other | Admitting: Occupational Therapy

## 2015-11-07 DIAGNOSIS — M6281 Muscle weakness (generalized): Secondary | ICD-10-CM

## 2015-11-07 DIAGNOSIS — R252 Cramp and spasm: Secondary | ICD-10-CM

## 2015-11-07 DIAGNOSIS — R278 Other lack of coordination: Secondary | ICD-10-CM

## 2015-11-07 DIAGNOSIS — G8194 Hemiplegia, unspecified affecting left nondominant side: Secondary | ICD-10-CM

## 2015-11-07 DIAGNOSIS — M25512 Pain in left shoulder: Secondary | ICD-10-CM

## 2015-11-07 DIAGNOSIS — R269 Unspecified abnormalities of gait and mobility: Secondary | ICD-10-CM

## 2015-11-07 DIAGNOSIS — R2681 Unsteadiness on feet: Secondary | ICD-10-CM

## 2015-11-07 DIAGNOSIS — R279 Unspecified lack of coordination: Secondary | ICD-10-CM

## 2015-11-07 DIAGNOSIS — R293 Abnormal posture: Secondary | ICD-10-CM

## 2015-11-07 NOTE — Therapy (Signed)
[facility] [address] [place], [place], [address] Phone: [phone]   Fax:  [phone]  Occupational Therapy Treatment  Patient Details  Name: Rebecca Yoder MRN: [id number] Date of Birth: [birth year] Referring Provider: Dr. Alysia Penna  Encounter Date: 11/07/2015      OT End of Session - 11/07/15 1054    Visit Number 16   Number of Visits 19   Date for OT Re-Evaluation 11/16/15   Authorization Type was Medicaid pending--pt did not want to wait to schedule; now approved for Medicaid   Authorization Time Period CCME APPROVED 12 OT VISITS FROM 2/23-5/17/17   Authorization - Visit Number 9   Authorization - Number of Visits 12   OT Start Time 1020   OT Stop Time 1100   OT Time Calculation (min) 40 min   Activity Tolerance Patient tolerated treatment well   Behavior During Therapy [facility] for tasks assessed/performed      Past Medical History  Diagnosis Date  . ASTHMA 12/10/2009  . CEREBROVASCULAR ACCIDENT, HX OF 12/10/2009  . HYPERLIPIDEMIA 12/10/2009  . HYPERTENSION 12/10/2009  . Complication of anesthesia     "just can't get me up" (06/09/2013)  . Stroke [facility]) 2008    denies residual on 06/09/2013  . Tendonitis of wrist, right   . Embolic stroke of right basal ganglia ([place]) 06/11/2015    with L residual weakness/hemiparesis     Past Surgical History  Procedure Laterality Date  . Ovarian cyst removal  1990's    There were no vitals filed for this visit.  Visit Diagnosis:  Left shoulder pain  Spasticity  Muscle weakness (generalized)  Decreased coordination      Subjective Assessment - 11/07/15 1054    Subjective  Deneies pain   Pertinent History hx of multiple CVAs (pt reports 06/11/15 CVA was the 4th),was independent prior to 06/11/15 CVA (living alone, taking care of mother who has CA, driving, working, and using LUE functionally), now living with sister/brother in-law who works a lot   Limitations pt reports no significant deficits from first 3 CVAs   Patient Stated Goals improve LUE functional use, incr ADL independence   Currently in Pain? No/denies                Neurological Re-education Exercises           Shoulder Flexion    AAROM;Both;10 reps;Supine    with PVC frame with min faciliation  Then in sitting with UE ranger for shoulder flexion  with min facilitation for scapula/elbow and cues with incr ROM noted without pain.                   Elbow Extension    AAROM;Supine;Both;10 reps    chest press with PVC frame              Weightbearing through bilateral UE's edge of mat for cat and cow mod facillitation                 Other Grasp and Release Exercises     Low range functional reaching to grasp/release cylinder pegs min difficulty and then placing in pegboard with mod difficulty/ v.c. with cueing to use tip pinch --incr consistency today.                           OT Short Term Goals - 10/29/15 1436    OT SHORT TERM GOAL #1  Title Pt will be independent with initial HEP.--check STGs 10/09/15   Baseline dependent   Time 6   Period Weeks   Status Achieved   OT SHORT TERM GOAL #2   Title Pt will demo at least 110* L shoulder flexion for functional reaching/ADLs without pain.   Baseline 90*   Time 6   Period Weeks   Status On-going  10/04/15:  100* with no pain, but min compensation/IR   OT SHORT TERM GOAL #3   Title Pt will demo ability to use LUE as stabilizer for ADLs.   Baseline unable   Time 6   Period Weeks   Status Achieved  10/04/15:  inconsistent; 10/29/15   OT SHORT TERM GOAL #4   Title Pt will demo ability to pick up 1inch object using tip pinch in 4/5 trials.   Baseline uses gross grasp or lateral pinch with difficulty   Time 6   Period Weeks   Status Achieved  10/04/15 met   OT SHORT TERM GOAL #5   Title Pt will be able to grasp/release cylinder object mod I in 4/5 trials.   Baseline min A   Time 6   Period  Weeks   Status Achieved  09/27/15           OT Long Term Goals - 09/25/15 1119    OT LONG TERM GOAL #1   Title Pt will be independent with initial HEP.--check STGs 11/20/15   Baseline dependent   Time 12   Period Weeks   Status On-going   OT LONG TERM GOAL #2   Title Pt will demo at least 120* L shoulder flexion for functional reaching/ADLs without pain.   Baseline 90*   Time 12   Period Weeks   Status On-going   OT LONG TERM GOAL #3   Title Pt will use LUE as gross assist for ADLs at least 75% of the time.   Baseline unable   Time 12   Period Weeks   Status On-going   OT LONG TERM GOAL #4   Title Pt will demo at least 105* L shoulder abduction for ADLs without pain.   Baseline 90*   Time 12   Period Weeks   Status On-going   OT LONG TERM GOAL #5   Title Pt will improve LUE coordination as shown by scoring at least 5 on box and blocks test.   Baseline unable   Time 12   Period Weeks   Status On-going               Plan - 11/08/15 0954    Clinical Impression Statement Pt is progressing towards goals with improved LUE control and functional use.   Pt will benefit from skilled therapeutic intervention in order to improve on the following deficits (Retired) Decreased mobility;Decreased strength;Impaired UE functional use;Pain;Decreased knowledge of use of DME;Decreased balance;Impaired tone;Decreased range of motion;Decreased coordination   Rehab Potential Good   Clinical Impairments Affecting Rehab Potential pt reports that sister works long hours, financial concerns   OT Frequency 2x / week   OT Duration 12 weeks   OT Treatment/Interventions Cryotherapy;Parrafin;Electrical Stimulation;Fluidtherapy;Moist Heat;Passive range of motion;Therapeutic activities;DME and/or AE instruction;Therapeutic exercises;Iontophoresis;Ultrasound;Neuromuscular education;Manual Therapy;Splinting;Patient/family education;Therapist, nutritional;Therapeutic exercise;Self-care/ADL  training;Energy conservation;Contrast Bath;Cognitive remediation/compensation;Visual/perceptual remediation/compensation   Plan neuro re-ed   OT Home Exercise Plan Education issued:  initial HEP 08/28/15; additional HEP 09/11/15, 09/13/15 wrist cock up brace and functional reach [place] and Agree with Plan of Care Patient  Problem List Patient Active Problem List   Diagnosis Date Noted  . Spastic hemiplegia affecting nondominant side ([place]) 11/02/2015  . Chronic tension-type headache, not intractable 10/18/2015  . Gait disturbance, post-stroke 08/14/2015  . Frozen shoulder syndrome 08/14/2015  . Subacromial impingement of left shoulder 08/14/2015  . Left shoulder pain 08/10/2015  . Diabetes type 2, controlled ([place]) 07/31/2015  . Eczema 07/31/2015  . Right middle cerebral artery stroke ([place]) 06/14/2015  . Left hemiparesis ([place])   . Left-sided neglect   . Embolic stroke of right basal ganglia ([place]) 06/11/2015  . TIA (transient ischemic attack) 06/09/2013  . Hypokalemia 06/09/2013  . PFO (patent foramen ovale) 06/09/2013  . Hyperlipidemia 12/10/2009  . Essential hypertension 12/10/2009  . Asthma 12/10/2009  . CEREBROVASCULAR ACCIDENT, HX OF 12/10/2009    Anelise Staron 11/08/2015, 9:57 AM Theone Murdoch, OTR/L [phone] Phone: [phone] 9:57 AM 03/30/2017[facility] [address] [place], [place], [address] Phone: [phone]   Fax:  [phone]  Name: Rebecca Yoder MRN: [id number] Date of Birth: [birth year]

## 2015-11-07 NOTE — Therapy (Signed)
[facility] [address] [place], [place], [address] Phone: [phone]   Fax:  [phone]  Physical Therapy Treatment  Patient Details  Name: Rebecca Yoder MRN: [id number] Date of Birth: 08/21/1965 Referring Provider: Letta Pate  Encounter Date: 11/07/2015        PT End of Session - 11/07/15 1203    Visit Number 5   Number of Visits 13   Date for PT Re-Evaluation 12/05/15   Authorization Type Medicaid-12 visits requested   PT Start Time 1103   PT Stop Time 1148   PT Time Calculation (min) 45 min   Equipment Utilized During Treatment Gait belt   Activity Tolerance Patient tolerated treatment well   Behavior During Therapy [facility] for tasks assessed/performed      Past Medical History  Diagnosis Date  . ASTHMA 12/10/2009  . CEREBROVASCULAR ACCIDENT, HX OF 12/10/2009  . HYPERLIPIDEMIA 12/10/2009  . HYPERTENSION 12/10/2009  . Complication of anesthesia     "just can't get me up" (06/09/2013)  . Stroke [facility]) 2008    denies residual on 06/09/2013  . Tendonitis of wrist, right   . Embolic stroke of right basal ganglia ([place]) 06/11/2015    with L residual weakness/hemiparesis     Past Surgical History  Procedure Laterality Date  . Ovarian cyst removal  1990's    There were no vitals filed for this visit.  Visit Diagnosis:  Left hemiparesis (HCC)  Spasticity  Decreased coordination  Muscle weakness of lower extremity  Abnormality of gait  Postural instability  Unsteadiness      Subjective Assessment - 11/07/15 1108    Subjective Plans to walk with nephew on street vs by herself for safety.   Patient is accompained by: Family member   Currently in Pain? No/denies                         [facility] Adult PT Treatment/Exercise - 11/07/15 0001    Ambulation/Gait   Ambulation/Gait Yes   Ambulation/Gait Assistance 4: Min guard  LOB x1 with L foot drag.   Ambulation Distance (Feet) 115 Feet    Assistive device Small based quad cane   Gait Pattern Step-to pattern  cues to increase R steplength   Ambulation Surface Level   Knee/Hip Exercises: Aerobic   Other Aerobic Sci fit stepper L= 3 43min                PT Education - 11/07/15 1156    Education provided Yes   Education Details Performed and Added balance exercises to HEP as seen in handout given 3/29.  Explained to pt that the safest way to walk may continue to be with Bil UE support even after balance training.   Person(s) Educated Patient   Methods Explanation;Demonstration;Tactile cues;Verbal cues;Handout   Comprehension Verbalized understanding;Returned demonstration;Need further instruction;Verbal cues required          PT Short Term Goals - 10/05/15 1203    PT SHORT TERM GOAL #1   Title Pt will be able to perform HEP with family supervision for improved balance, strength, gait.  TARGET 12/03/15   Baseline No current HEP for lower extremity strength/balance   Time 4   Period Weeks   Status New   PT SHORT TERM GOAL #2   Title Pt will perform at least 6 of 10 reps of sit<>stand transfers with minimal UE support for improved efficiency and safety with gait.   Baseline Supervision for  transfers with definite UE support   Time 4   Period Weeks   Status New   PT SHORT TERM GOAL #3   Title Pt will improve TUG score to less than or equal to 38 seconds for decreased fall risk.   Baseline TUG 44.50 sec with RW   Time 4   Period Weeks   Status New   PT SHORT TERM GOAL #4   Title Pt will improve Berg Balance score to at least 27/56 for decreased fall risk.   Baseline Berg scores 22/56 (scores <45/56 indicate increased fall risk.)   Time 4   Period Weeks   Status New           PT Long Term Goals - 10/05/15 1207    PT LONG TERM GOAL #1   Title Pt will verbalize understanding of fall prevention within the home environment.  TARGET 12/05/15   Baseline at fall risk per Merrilee Jansky, TUG scores   Time 8    Period Weeks   Status New   PT LONG TERM GOAL #2   Title Pt will improve gait velocity to at least 1.2 ft/sec for improved gait efficiency and safety.   Baseline gait velocity 0.85 ft/sec   Time 8   Period Weeks   Status New   PT LONG TERM GOAL #3   Title Pt will improve TUG score to less than or equal to 30 seconds for decreased fall risk.   Baseline TUG 44.50 sec   Time 8   Period Weeks   Status New   PT LONG TERM GOAL #4   Title Pt will improve Berg Balance score to at least 32/56 for decreased fall risk.   Baseline Berg score 22/56   Time 8   Period Weeks   Status New   PT LONG TERM GOAL #5   Title Pt will verbalize understanding of community fitness upon D/C from PT.   Baseline no current formal HEP/community fitness   Time 8   Period Weeks   Status New               Plan - 11/07/15 1200    Clinical Impression Statement Trialled [facility] but continues to demonstrate greater safety with RW during gait and standing activities.  Added balance exercises to HEP.   Pt will benefit from skilled therapeutic intervention in order to improve on the following deficits Abnormal gait;Decreased balance;Decreased mobility;Decreased range of motion;Decreased strength;Difficulty walking;Impaired tone;Pain;Postural dysfunction   Rehab Potential Good   PT Frequency --  12 visits over 12 weeks   PT Duration 12 weeks   PT Treatment/Interventions ADLs/Self Care Home Management;Electrical Stimulation;Functional mobility training;Therapeutic activities;Therapeutic exercise;Gait training;DME Instruction;Balance training;Neuromuscular re-education;Patient/family education;Orthotic Fit/Training   PT Next Visit Plan  gait trial wtih cane, Review standing balance HEP, and LLE strengthening.   Consulted and Agree with Plan of Care Patient        Problem List Patient Active Problem List   Diagnosis Date Noted  . Spastic hemiplegia affecting nondominant side ([place]) 11/02/2015  . Chronic  tension-type headache, not intractable 10/18/2015  . Gait disturbance, post-stroke 08/14/2015  . Frozen shoulder syndrome 08/14/2015  . Subacromial impingement of left shoulder 08/14/2015  . Left shoulder pain 08/10/2015  . Diabetes type 2, controlled ([place]) 07/31/2015  . Eczema 07/31/2015  . Right middle cerebral artery stroke ([place]) 06/14/2015  . Left hemiparesis ([place])   . Left-sided neglect   . Embolic stroke of right basal ganglia ([place]) 06/11/2015  . TIA (transient  ischemic attack) 06/09/2013  . Hypokalemia 06/09/2013  . PFO (patent foramen ovale) 06/09/2013  . Hyperlipidemia 12/10/2009  . Essential hypertension 12/10/2009  . Asthma 12/10/2009  . CEREBROVASCULAR ACCIDENT, HX OF 12/10/2009   Bjorn Loser, PTA  11/07/2015, 12:03 PM   [facility] [address] [place], [place], [address] Phone: [phone]   Fax:  [phone]  Name: Rebecca Yoder MRN: [id number] Date of Birth: 01/02/1966

## 2015-11-07 NOTE — Patient Instructions (Addendum)
Walking At counter at home    Perform without assistive device. Walk on solid surface with hand on to support.  keeping a straight path. Repeat __2-4__ times per session. Do _1-2___ sessions per day. Repeat with eyes closed. Repeat in dimly lit room.  Copyright  VHI. All rights reserved.  Backward    Walk backwards with eyes open. Take even steps, making sure each foot lifts off floor. Repeat for __2-4 reps__ Do __1-2__ sessions per day. Copyright  VHI. All rights reserved.  Side-Stepping    Walk to left side with eyes open. Take even steps, leading with same foot. Make sure each foot lifts off the floor. Repeat in opposite direction. Repeat for __2-4 reps__  Do __1-2__ sessions per day. Copyright  VHI. All rights reserved.

## 2015-11-08 ENCOUNTER — Ambulatory Visit: Payer: Medicaid Other | Admitting: Physical Therapy

## 2015-11-12 ENCOUNTER — Encounter: Payer: Self-pay | Admitting: Physical Therapy

## 2015-11-12 ENCOUNTER — Ambulatory Visit: Payer: Medicaid Other | Admitting: Occupational Therapy

## 2015-11-12 ENCOUNTER — Ambulatory Visit: Payer: Medicaid Other | Attending: Physical Medicine & Rehabilitation | Admitting: Physical Therapy

## 2015-11-12 DIAGNOSIS — R278 Other lack of coordination: Secondary | ICD-10-CM | POA: Insufficient documentation

## 2015-11-12 DIAGNOSIS — R269 Unspecified abnormalities of gait and mobility: Secondary | ICD-10-CM | POA: Insufficient documentation

## 2015-11-12 DIAGNOSIS — G8114 Spastic hemiplegia affecting left nondominant side: Secondary | ICD-10-CM | POA: Diagnosis present

## 2015-11-12 DIAGNOSIS — R279 Unspecified lack of coordination: Secondary | ICD-10-CM | POA: Diagnosis present

## 2015-11-12 DIAGNOSIS — R2681 Unsteadiness on feet: Secondary | ICD-10-CM | POA: Insufficient documentation

## 2015-11-12 DIAGNOSIS — M25612 Stiffness of left shoulder, not elsewhere classified: Secondary | ICD-10-CM | POA: Diagnosis present

## 2015-11-12 DIAGNOSIS — R2689 Other abnormalities of gait and mobility: Secondary | ICD-10-CM | POA: Diagnosis present

## 2015-11-12 DIAGNOSIS — G8194 Hemiplegia, unspecified affecting left nondominant side: Secondary | ICD-10-CM | POA: Insufficient documentation

## 2015-11-12 DIAGNOSIS — M6281 Muscle weakness (generalized): Secondary | ICD-10-CM | POA: Insufficient documentation

## 2015-11-12 DIAGNOSIS — R293 Abnormal posture: Secondary | ICD-10-CM | POA: Diagnosis present

## 2015-11-12 NOTE — Therapy (Addendum)
[facility] [address] [place], [place], [address] Phone: [phone]   Fax:  [phone]  Physical Therapy Treatment  Patient Details  Name: Rebecca Yoder MRN: [id number] Date of Birth: 1966/04/24 Referring Provider: Letta Pate  Encounter Date: 11/12/2015      PT End of Session - 11/12/15 1058    Visit Number 6   Number of Visits 13   Date for PT Re-Evaluation 12/05/15   Authorization Type Medicaid-12 visits requested   PT Start Time 1015   PT Stop Time 1055   PT Time Calculation (min) 40 min   Equipment Utilized During Treatment Gait belt   Activity Tolerance Patient tolerated treatment well   Behavior During Therapy [facility] for tasks assessed/performed      Past Medical History  Diagnosis Date  . ASTHMA 12/10/2009  . CEREBROVASCULAR ACCIDENT, HX OF 12/10/2009  . HYPERLIPIDEMIA 12/10/2009  . HYPERTENSION 12/10/2009  . Complication of anesthesia     "just can't get me up" (06/09/2013)  . Stroke [facility]) 2008    denies residual on 06/09/2013  . Tendonitis of wrist, right   . Embolic stroke of right basal ganglia ([place]) 06/11/2015    with L residual weakness/hemiparesis     Past Surgical History  Procedure Laterality Date  . Ovarian cyst removal  1990's    There were no vitals filed for this visit.  Visit Diagnosis:  Left hemiparesis (HCC)  Decreased coordination  Muscle weakness of lower extremity  Abnormality of gait  Postural instability  Unsteadiness      Subjective Assessment - 11/12/15 1021    Subjective Pt reports walking to the bathroom at night more consistently per sister's recommendation vs. BSC.   Patient is accompained by: Family member   Currently in Pain? No/denies                              Balance Exercises - 11/12/15 1030    Balance Exercises: Standing   SLS Eyes open;20 secs;15 secs;Upper extremity support 2;Upper extremity support 1  attempting  decreased UE support.   Retro Gait Upper extremity support;4 reps  forward then backward progressing without UE support Min A   Sidestepping Upper extremity support;4 reps  progressing with no UE support, Supervision.      Corner balance exercises consisting of: Standing on compliant surface with feet **Feet apart* with eyes *open  Progressing with eyes closed** with chair for safety. Performed head- turns, nods,  Trunk roatations *intermittant** UE support Performed at *supervsion** level. Multiple reps with each        PT Education - 11/12/15 1041    Education provided Yes   Education Details Encouraged pt to continue to communicate with sister and plan safe household tasks that she can safely pratice to progress her Independence.   Person(s) Educated Patient   Methods Explanation   Comprehension Verbalized understanding          PT Short Term Goals - 10/05/15 1203    PT SHORT TERM GOAL #1   Title Pt will be able to perform HEP with family supervision for improved balance, strength, gait.  TARGET 12/03/15   Baseline No current HEP for lower extremity strength/balance   Time 4   Period Weeks   Status New   PT SHORT TERM GOAL #2   Title Pt will perform at least 6 of 10 reps of sit<>stand transfers with minimal UE support for improved efficiency  and safety with gait.   Baseline Supervision for transfers with definite UE support   Time 4   Period Weeks   Status New   PT SHORT TERM GOAL #3   Title Pt will improve TUG score to less than or equal to 38 seconds for decreased fall risk.   Baseline TUG 44.50 sec with RW   Time 4   Period Weeks   Status New   PT SHORT TERM GOAL #4   Title Pt will improve Berg Balance score to at least 27/56 for decreased fall risk.   Baseline Berg scores 22/56 (scores <45/56 indicate increased fall risk.)   Time 4   Period Weeks   Status New           PT Long Term Goals - 10/05/15 1207    PT LONG TERM GOAL #1   Title Pt will  verbalize understanding of fall prevention within the home environment.  TARGET 12/05/15   Baseline at fall risk per Merrilee Jansky, TUG scores   Time 8   Period Weeks   Status New   PT LONG TERM GOAL #2   Title Pt will improve gait velocity to at least 1.2 ft/sec for improved gait efficiency and safety.   Baseline gait velocity 0.85 ft/sec   Time 8   Period Weeks   Status New   PT LONG TERM GOAL #3   Title Pt will improve TUG score to less than or equal to 30 seconds for decreased fall risk.   Baseline TUG 44.50 sec   Time 8   Period Weeks   Status New   PT LONG TERM GOAL #4   Title Pt will improve Berg Balance score to at least 32/56 for decreased fall risk.   Baseline Berg score 22/56   Time 8   Period Weeks   Status New   PT LONG TERM GOAL #5   Title Pt will verbalize understanding of community fitness upon D/C from PT.   Baseline no current formal HEP/community fitness   Time 8   Period Weeks   Status New               Plan - 11/12/15 1059    Clinical Impression Statement Skilled session focused on dynamice standing balance progressing on compliant surface and decreased UE support.  Pt reports making steady progress with functional mobilty at home.   Pt will benefit from skilled therapeutic intervention in order to improve on the following deficits Abnormal gait;Decreased balance;Decreased mobility;Decreased range of motion;Decreased strength;Difficulty walking;Impaired tone;Pain;Postural dysfunction   Rehab Potential Good   PT Frequency --  12 visits over 12 weeks   PT Duration 12 weeks   PT Treatment/Interventions ADLs/Self Care Home Management;Electrical Stimulation;Functional mobility training;Therapeutic activities;Therapeutic exercise;Gait training;DME Instruction;Balance training;Neuromuscular re-education;Patient/family education;Orthotic Fit/Training   PT Next Visit Plan standing balance on compliant surface, SLS and narrow BOS, LLE strengthening.   Consulted and  Agree with Plan of Care Patient        Problem List Patient Active Problem List   Diagnosis Date Noted  . Spastic hemiplegia affecting nondominant side ([place]) 11/02/2015  . Chronic tension-type headache, not intractable 10/18/2015  . Gait disturbance, post-stroke 08/14/2015  . Frozen shoulder syndrome 08/14/2015  . Subacromial impingement of left shoulder 08/14/2015  . Left shoulder pain 08/10/2015  . Diabetes type 2, controlled ([place]) 07/31/2015  . Eczema 07/31/2015  . Right middle cerebral artery stroke ([place]) 06/14/2015  . Left hemiparesis ([place])   . Left-sided neglect   .  Embolic stroke of right basal ganglia ([place]) 06/11/2015  . TIA (transient ischemic attack) 06/09/2013  . Hypokalemia 06/09/2013  . PFO (patent foramen ovale) 06/09/2013  . Hyperlipidemia 12/10/2009  . Essential hypertension 12/10/2009  . Asthma 12/10/2009  . CEREBROVASCULAR ACCIDENT, HX OF 12/10/2009  Bjorn Loser, PTA  11/12/2015, 11:57 AM   [facility] [address] [place], [place], [address] Phone: [phone]   Fax:  [phone]  Name: Rebecca Yoder MRN: [id number] Date of Birth: 01-Apr-1966    Visit diagnoses addended to muscle weakness (generalized), other abnormalities of gait and mobility, and abnormal posture.  Mady Haagensen, PT 11/16/2015 5:27 PM Phone: [phone] Fax: [phone]

## 2015-11-12 NOTE — Therapy (Signed)
[facility] [address] [place], [place], [address] Phone: [phone]   Fax:  [phone]  Occupational Therapy Treatment  Patient Details  Name: Rebecca Yoder MRN: [id number] Date of Birth: [birth year] Referring Provider: Dr. Alysia Penna  Encounter Date: 11/12/2015      OT End of Session - 11/12/15 1110    Visit Number 17   Number of Visits 19   Date for OT Re-Evaluation 11/16/15   Authorization Type was Medicaid pending--pt did not want to wait to schedule; now approved for Medicaid   Authorization Time Period [employer] APPROVED 12 OT VISITS FROM 2/23-5/17/17   Authorization - Visit Number 10   Authorization - Number of Visits 12   OT Start Time 1104   OT Stop Time 1145   OT Time Calculation (min) 41 min   Activity Tolerance Patient tolerated treatment well   Behavior During Therapy [facility] for tasks assessed/performed      Past Medical History  Diagnosis Date  . ASTHMA 12/10/2009  . CEREBROVASCULAR ACCIDENT, HX OF 12/10/2009  . HYPERLIPIDEMIA 12/10/2009  . HYPERTENSION 12/10/2009  . Complication of anesthesia     "just can't get me up" (06/09/2013)  . Stroke [facility]) 2008    denies residual on 06/09/2013  . Tendonitis of wrist, right   . Embolic stroke of right basal ganglia ([place]) 06/11/2015    with L residual weakness/hemiparesis     Past Surgical History  Procedure Laterality Date  . Ovarian cyst removal  1990's    There were no vitals filed for this visit.  Visit Diagnosis:  Spastic hemiplegia affecting left nondominant side (HCC)  Other lack of coordination  Stiffness of left shoulder joint      Subjective Assessment - 11/12/15 1107    Subjective  Had L shoulder injection, denies pain   Pertinent History hx of multiple CVAs (pt reports 06/11/15 CVA was the 4th),was independent prior to 06/11/15 CVA (living alone, taking care of mother who has CA, driving, working, and using LUE functionally), now  living with sister/brother in-law who works a lot   Limitations pt reports no significant deficits from first 3 CVAs   Patient Stated Goals improve LUE functional use, incr ADL independence   Currently in Pain? No/denies          Neurological Re-education Exercises    Shoulder Flexion AAROM;Both;10 reps; supine, Seated  with PVC frame with min cueingfor compensation;  Self-PROM shoulder flex at beginning of session       Elbow Extension AAROM chest press with PVC with min cueing for full elbow extension in sitting and supine    Other Grasp and Release Exercises  Flipping large cards with min cueing/facilitation for compensation and focus on supination  Low-mid range functional reaching to pick up blocks and place in bowl with min cueing for compensation and for elbow ext     Sitting with weight on hand with min cues/facilitation for body on arm movements for incr shoulder stabilization, tricep activation (improved today)                              Folding towels and washcloths with min cues for elbow ext and compensation patterns.                       OT Short Term Goals - 10/29/15 1436    OT SHORT TERM GOAL #1   Title  Pt will be independent with initial HEP.--check STGs 10/09/15   Baseline dependent   Time 6   Period Weeks   Status Achieved   OT SHORT TERM GOAL #2   Title Pt will demo at least 110* L shoulder flexion for functional reaching/ADLs without pain.   Baseline 90*   Time 6   Period Weeks   Status On-going  10/04/15:  100* with no pain, but min compensation/IR   OT SHORT TERM GOAL #3   Title Pt will demo ability to use LUE as stabilizer for ADLs.   Baseline unable   Time 6   Period Weeks   Status Achieved  10/04/15:  inconsistent; 10/29/15   OT SHORT TERM GOAL #4   Title Pt will demo ability to pick up 1inch object using tip pinch in 4/5 trials.   Baseline uses gross grasp  or lateral pinch with difficulty   Time 6   Period Weeks   Status Achieved  10/04/15 met   OT SHORT TERM GOAL #5   Title Pt will be able to grasp/release cylinder object mod I in 4/5 trials.   Baseline min A   Time 6   Period Weeks   Status Achieved  09/27/15           OT Long Term Goals - 09/25/15 1119    OT LONG TERM GOAL #1   Title Pt will be independent with initial HEP.--check STGs 11/20/15   Baseline dependent   Time 12   Period Weeks   Status On-going   OT LONG TERM GOAL #2   Title Pt will demo at least 120* L shoulder flexion for functional reaching/ADLs without pain.   Baseline 90*   Time 12   Period Weeks   Status On-going   OT LONG TERM GOAL #3   Title Pt will use LUE as gross assist for ADLs at least 75% of the time.   Baseline unable   Time 12   Period Weeks   Status On-going   OT LONG TERM GOAL #4   Title Pt will demo at least 105* L shoulder abduction for ADLs without pain.   Baseline 90*   Time 12   Period Weeks   Status On-going   OT LONG TERM GOAL #5   Title Pt will improve LUE coordination as shown by scoring at least 5 on box and blocks test.   Baseline unable   Time 12   Period Weeks   Status On-going               Plan - 11/12/15 1111    Clinical Impression Statement Pt is progressing towards goals with improved LUE control and functional use.   Plan neuro re-ed, begin checking LTGs in prep for d/c    OT Home Exercise Plan Education issued:  initial HEP 08/28/15; additional HEP 09/11/15, 09/13/15 wrist cock up brace and functional reach [place] and Agree with Plan of Care Patient        Problem List Patient Active Problem List   Diagnosis Date Noted  . Spastic hemiplegia affecting nondominant side ([place]) 11/02/2015  . Chronic tension-type headache, not intractable 10/18/2015  . Gait disturbance, post-stroke 08/14/2015  . Frozen shoulder syndrome 08/14/2015  . Subacromial impingement of left shoulder 08/14/2015  . Left  shoulder pain 08/10/2015  . Diabetes type 2, controlled ([place]) 07/31/2015  . Eczema 07/31/2015  . Right middle cerebral artery stroke ([place]) 06/14/2015  . Left hemiparesis ([place])   .  Left-sided neglect   . Embolic stroke of right basal ganglia ([place]) 06/11/2015  . TIA (transient ischemic attack) 06/09/2013  . Hypokalemia 06/09/2013  . PFO (patent foramen ovale) 06/09/2013  . Hyperlipidemia 12/10/2009  . Essential hypertension 12/10/2009  . Asthma 12/10/2009  . CEREBROVASCULAR ACCIDENT, HX OF 12/10/2009    [facility] 11/12/2015, 11:13 AM  [facility] [address] [place], [place], [address] Phone: [phone]   Fax:  [phone]  Name: ANIYA JOLICOEUR MRN: [id number] Date of Birth: [birth year]  Vianne Bulls, OTR/L [facility] [address] [place], [place]  [address] [phone] phone [phone] 11/12/2015 11:13 AM

## 2015-11-15 ENCOUNTER — Ambulatory Visit: Payer: Medicaid Other | Admitting: Occupational Therapy

## 2015-11-15 ENCOUNTER — Ambulatory Visit: Payer: Medicaid Other | Admitting: Physical Therapy

## 2015-11-15 DIAGNOSIS — G8194 Hemiplegia, unspecified affecting left nondominant side: Secondary | ICD-10-CM | POA: Diagnosis not present

## 2015-11-15 DIAGNOSIS — R2689 Other abnormalities of gait and mobility: Secondary | ICD-10-CM

## 2015-11-15 DIAGNOSIS — M6281 Muscle weakness (generalized): Secondary | ICD-10-CM

## 2015-11-15 NOTE — Patient Instructions (Signed)
HIP / KNEE: Flexion, Heel Slides - Supine    Keep your left heel on the bed as you slide your heel towards your bottom.  Keep your knee in bent position for 2-3 seconds, then slide your heel away from your bottom to straighten your leg.  Repeat 10 times.

## 2015-11-16 NOTE — Therapy (Signed)
[facility] [address] [place], [place], [address] Phone: [phone]   Fax:  [phone]  Physical Therapy Treatment  Patient Details  Name: Rebecca Yoder MRN: [id number] Date of Birth: 1966/06/21 Referring Provider: Letta Pate  Encounter Date: 11/15/2015      PT End of Session - 11/16/15 1701    Visit Number 7   Number of Visits 13   Date for PT Re-Evaluation 12/05/15   Authorization Type Medicaid-12 visits requested   Authorization - Visit Number 6   Authorization - Number of Visits 12   PT Start Time 0935   PT Stop Time 1015   PT Time Calculation (min) 40 min   Equipment Utilized During Treatment Gait belt   Activity Tolerance Patient tolerated treatment well   Behavior During Therapy [facility] for tasks assessed/performed      Past Medical History  Diagnosis Date  . ASTHMA 12/10/2009  . CEREBROVASCULAR ACCIDENT, HX OF 12/10/2009  . HYPERLIPIDEMIA 12/10/2009  . HYPERTENSION 12/10/2009  . Complication of anesthesia     "just can't get me up" (06/09/2013)  . Stroke [facility]) 2008    denies residual on 06/09/2013  . Tendonitis of wrist, right   . Embolic stroke of right basal ganglia ([place]) 06/11/2015    with L residual weakness/hemiparesis     Past Surgical History  Procedure Laterality Date  . Ovarian cyst removal  1990's    There were no vitals filed for this visit.      Subjective Assessment - 11/15/15 0938    Subjective No problems, no pain today.   Patient Stated Goals Pt's goal for therapy is to walk better and use hand better.   Currently in Pain? No/denies            [facility] PT Assessment - 11/15/15 0956    Berg Balance Test   Sit to Stand Able to stand  independently using hands   Standing Unsupported Able to stand safely 2 minutes   Sitting with Back Unsupported but Feet Supported on Floor or Stool Able to sit safely and securely 2 minutes   Stand to Sit Controls descent by using hands    Transfers Able to transfer safely, definite need of hands   Standing Unsupported with Eyes Closed Able to stand 10 seconds safely   Standing Ubsupported with Feet Together Able to place feet together independently and stand 1 minute safely   From Standing, Reach Forward with Outstretched Arm Can reach forward >12 cm safely (5")   From Standing Position, Pick up Object from Floor Able to pick up shoe safely and easily   From Standing Position, Turn to Look Behind Over each Shoulder Looks behind one side only/other side shows less weight shift   Turn 360 Degrees Needs close supervision or verbal cueing   Standing Unsupported, Alternately Place Feet on Step/Stool Needs assistance to keep from falling or unable to try   Standing Unsupported, One Foot in Front Able to take small step independently and hold 30 seconds   Standing on One Leg Tries to lift leg/unable to hold 3 seconds but remains standing independently   Total Score 39   Berg comment: Score on Berg Balance test improved from 22/56 to 39/56.   Timed Up and Go Test   TUG Normal TUG   Normal TUG (seconds) 38.1                     [facility] Adult PT Treatment/Exercise - 11/16/15  0001    Transfers   Transfers Sit to Stand;Stand to Sit   Sit to Stand 6: Modified independent (Device/Increase time);With upper extremity assist;From chair/3-in-1   Stand to Sit 6: Modified independent (Device/Increase time);With upper extremity assist;To chair/3-in-1   Number of Reps 10 reps   Comments Attempted sit<>stand without UE support, pt unable.   Ambulation/Gait   Ambulation/Gait Yes   Ambulation/Gait Assistance 4: Min guard   Ambulation Distance (Feet) 200 Feet  then 100   Assistive device Rolling walker   Gait Pattern Step-through pattern;Decreased stance time - left;Decreased hip/knee flexion - left;Decreased weight shift to left  decreased timing with swing phase L due to tone   Ambulation Surface Level;Indoor   Gait Comments Pt  responds well to cues to slow pace, for consistent step length and stance time both legs.       Therapeutic Exercise: Reviewed pt's HEP for lower extremity strengthening:  Bridging, SLR, heelslides x 10 reps each.  With SLR, pt needs cues for quad activation and to not lift as high to prevent quad lag.  With heelslides, pt tends to lift leg in air with hip/knee flexion.  Provided cues (written and verbal) to slide heel along bed/mat and slowly slide out.  Provides cues in general for pacing/slowed performance of exercises for optimal muscle activation and strengthening.          PT Education - 11/16/15 1701    Education provided Yes   Education Details Reviewed HEP and provided another copy of heelslides with cues for proper technique; discussed progress towards goals   Person(s) Educated Patient   Methods Explanation;Demonstration;Handout   Comprehension Verbalized understanding          PT Short Term Goals - 11/15/15 0943    PT SHORT TERM GOAL #1   Title Pt will be able to perform HEP with family supervision for improved balance, strength, gait.  TARGET 12/03/15   Baseline Needs cues for proper techniqe-11/15/15   Time 4   Period Weeks   Status Partially Met   PT SHORT TERM GOAL #2   Title Pt will perform at least 6 of 10 reps of sit<>stand transfers with minimal UE support for improved efficiency and safety with gait.   Baseline Supervision for transfers with definite UE support   Time 4   Period Weeks   Status Achieved   PT SHORT TERM GOAL #3   Title Pt will improve TUG score to less than or equal to 38 seconds for decreased fall risk.   Baseline TUG 38.1 sec with RW 11/15/15   Time 4   Period Weeks   Status Achieved   PT SHORT TERM GOAL #4   Title Pt will improve Berg Balance score to at least 27/56 for decreased fall risk.   Baseline Merrilee Jansky 39/56 11/15/15   Time 4   Period Weeks   Status Achieved           PT Long Term Goals - 11/16/15 1704    PT LONG TERM GOAL #1    Title Pt will verbalize understanding of fall prevention within the home environment.  TARGET 12/05/15   Baseline at fall risk per Merrilee Jansky, TUG scores   Time 8   Period Weeks   Status On-going   PT LONG TERM GOAL #2   Title Pt will improve gait velocity to at least 1.2 ft/sec for improved gait efficiency and safety.   Baseline gait velocity 0.85 ft/sec   Time 8   Period  Weeks   Status On-going   PT LONG TERM GOAL #3   Title Pt will improve TUG score to less than or equal to 30 seconds for decreased fall risk.   Baseline TUG 44.50 sec   Time 8   Period Weeks   Status On-going   PT LONG TERM GOAL #4   Title Pt will improve Berg Balance score to at least 44/56 for decreased fall risk.   Baseline Berg score 39/56 11/15/15   Time 8   Period Weeks   Status Revised   PT LONG TERM GOAL #5   Title Pt will verbalize understanding of community fitness upon D/C from PT.   Baseline no current formal HEP/community fitness   Time 8   Period Weeks   Status On-going               Plan - 11/16/15 1702    Clinical Impression Statement Short term goals addressed this visit, with patient making significant progress towards functional mobility goals.  She has partially met STG #1, metSTG 2, 3, 4.  Pt improved Berg Balance score from 22/56 to 39/56.  LTG #4 for Berg score revised to 44/56.  Pt continues to need cues for equal step length and stance time/weightbearing through LLE with gait.  When trying to increase LLE step length, she is limited by tone.  Pt will continue to benefit from further skilled PT to address balance, gait, strength, transfers and functional mobility.   Rehab Potential Good   PT Frequency --  12 visits over 12 weeks   PT Duration 12 weeks   PT Treatment/Interventions ADLs/Self Care Home Management;Electrical Stimulation;Functional mobility training;Therapeutic activities;Therapeutic exercise;Gait training;DME Instruction;Balance training;Neuromuscular  re-education;Patient/family education;Orthotic Fit/Training   PT Next Visit Plan Continue to work on standing balance on compliant surface, SLS and narrow BOS, LLE strengthening towards LTGs.   Consulted and Agree with Plan of Care Patient      Patient will benefit from skilled therapeutic intervention in order to improve the following deficits and impairments:  Abnormal gait, Decreased balance, Decreased mobility, Decreased range of motion, Decreased strength, Difficulty walking, Impaired tone, Pain, Postural dysfunction  Visit Diagnosis: Other abnormalities of gait and mobility  Muscle weakness (generalized)     Problem List Patient Active Problem List   Diagnosis Date Noted  . Spastic hemiplegia affecting nondominant side ([place]) 11/02/2015  . Chronic tension-type headache, not intractable 10/18/2015  . Gait disturbance, post-stroke 08/14/2015  . Frozen shoulder syndrome 08/14/2015  . Subacromial impingement of left shoulder 08/14/2015  . Left shoulder pain 08/10/2015  . Diabetes type 2, controlled ([place]) 07/31/2015  . Eczema 07/31/2015  . Right middle cerebral artery stroke ([place]) 06/14/2015  . Left hemiparesis ([place])   . Left-sided neglect   . Embolic stroke of right basal ganglia ([place]) 06/11/2015  . TIA (transient ischemic attack) 06/09/2013  . Hypokalemia 06/09/2013  . PFO (patent foramen ovale) 06/09/2013  . Hyperlipidemia 12/10/2009  . Essential hypertension 12/10/2009  . Asthma 12/10/2009  . CEREBROVASCULAR ACCIDENT, HX OF 12/10/2009    Hunter Bachar W. 11/16/2015, 5:14 PM  Frazier Butt., PT  [facility] [address] [place], [place], [address] Phone: [phone]   Fax:  [phone]  Name: CELESTER MORGAN MRN: [id number] Date of Birth: Nov 28, 1965

## 2015-11-20 ENCOUNTER — Encounter: Payer: Self-pay | Admitting: Physical Therapy

## 2015-11-20 ENCOUNTER — Ambulatory Visit: Payer: Medicaid Other | Admitting: Physical Therapy

## 2015-11-20 ENCOUNTER — Ambulatory Visit: Payer: Medicaid Other | Admitting: Occupational Therapy

## 2015-11-20 DIAGNOSIS — R279 Unspecified lack of coordination: Secondary | ICD-10-CM

## 2015-11-20 DIAGNOSIS — G8194 Hemiplegia, unspecified affecting left nondominant side: Secondary | ICD-10-CM

## 2015-11-20 DIAGNOSIS — M25612 Stiffness of left shoulder, not elsewhere classified: Secondary | ICD-10-CM

## 2015-11-20 DIAGNOSIS — R278 Other lack of coordination: Secondary | ICD-10-CM

## 2015-11-20 DIAGNOSIS — M6281 Muscle weakness (generalized): Secondary | ICD-10-CM

## 2015-11-20 DIAGNOSIS — G8114 Spastic hemiplegia affecting left nondominant side: Secondary | ICD-10-CM

## 2015-11-20 DIAGNOSIS — R2689 Other abnormalities of gait and mobility: Secondary | ICD-10-CM

## 2015-11-20 MED FILL — AMLODIPINE BESYLATE 10 MG T: 10 | 30 days supply | Qty: 30 | Fill #1

## 2015-11-20 MED FILL — hydrALAZINE HCL 10 MG TABS: 10 | 30 days supply | Qty: 90 | Fill #1

## 2015-11-20 NOTE — Patient Instructions (Addendum)
Basic Activities:   Use your affected hand to perform the following activities for 20-30 minutes 1-2 times/day.  Stop activity if you experience pain.  - Wipe table top - Bring plastic cup to mouth, then return to table top and release - Flip playing cards - Pick up cotton balls and place in a container - Turn doorknob - Open/close cabinet door with handle, open/close refrigerator door, turn on water when washing hands (test temperature with right hand) - Fold towels - Stack blocks - Pick up 1-inch blocks - Pick up a pencil - Put empty clothes hangers on a rack, remove, and repeat - Use left hand to hold dish when washing dishes - Eat finger foods with Left hand or both hands - Try to put light dishes/groceries away with both hands

## 2015-11-20 NOTE — Therapy (Signed)
[facility] [address] [place], [place], [address] Phone: [phone]   Fax:  [phone]  Occupational Therapy Treatment  Patient Details  Name: Rebecca Yoder MRN: [id number] Date of Birth: August 26, 1965 Referring Provider: Dr. Alysia Penna  Encounter Date: 11/20/2015      OT End of Session - 11/20/15 1421    Visit Number 18   Number of Visits 19   Date for OT Re-Evaluation 11/16/15   Authorization Type was Medicaid pending--pt did not want to wait to schedule; now approved for Medicaid   Authorization Time Period CCME APPROVED 12 OT VISITS FROM 2/23-5/17/17   Authorization - Visit Number 11   Authorization - Number of Visits 12   OT Start Time 1408   OT Stop Time 1448   OT Time Calculation (min) 40 min   Activity Tolerance Patient tolerated treatment well   Behavior During Therapy [facility] for tasks assessed/performed      Past Medical History  Diagnosis Date  . ASTHMA 12/10/2009  . CEREBROVASCULAR ACCIDENT, HX OF 12/10/2009  . HYPERLIPIDEMIA 12/10/2009  . HYPERTENSION 12/10/2009  . Complication of anesthesia     "just can't get me up" (06/09/2013)  . Stroke [facility]) 2008    denies residual on 06/09/2013  . Tendonitis of wrist, right   . Embolic stroke of right basal ganglia ([place]) 06/11/2015    with L residual weakness/hemiparesis     Past Surgical History  Procedure Laterality Date  . Ovarian cyst removal  1990's    There were no vitals filed for this visit.      Subjective Assessment - 11/20/15 1420    Subjective  "just tight"   Pertinent History hx of multiple CVAs (pt reports 06/11/15 CVA was the 4th),was independent prior to 06/11/15 CVA (living alone, taking care of mother who has CA, driving, working, and using LUE functionally), now living with sister/brother in-law who works a lot   Limitations pt reports no significant deficits from first 3 CVAs   Patient Stated Goals improve LUE functional use,  incr ADL independence   Currently in Pain? No/denies          Treatment:  Stacking blocks for incr control/coordination with min cueing for compensation patterns.  Then unstacking with low-mid range functional reaching to place in container with set-up and min cueing for elbow ext.--min difficulty.  Mid-range functional reaching to grasp/release cylinder objects in shoulder flex/abduction with min difficulty and cues for elbow extension/compensation.    Began checking goals and discussing progress--see goals section for details with ROM and box and blocks.                OT Education - 11/20/15 1457    Education Details Ways to incr LUE functional use   Person(s) Educated Patient   Methods Explanation;Demonstration;Verbal cues;Handout   Comprehension Verbalized understanding;Verbal cues required;Returned demonstration          OT Short Term Goals - 11/20/15 1456    OT SHORT TERM GOAL #1   Title Pt will be independent with initial HEP.--check STGs 10/09/15   Baseline dependent   Time 6   Period Weeks   Status Achieved   OT SHORT TERM GOAL #2   Title Pt will demo at least 110* L shoulder flexion for functional reaching/ADLs without pain.   Baseline 90*   Time 6   Period Weeks   Status Achieved  10/04/15:  100* with no pain, but min compensation/IR; 11/20/15 110-120* with min compensation/no  pain   OT SHORT TERM GOAL #3   Title Pt will demo ability to use LUE as stabilizer for ADLs.   Baseline unable   Time 6   Period Weeks   Status Achieved  10/04/15:  inconsistent; 10/29/15   OT SHORT TERM GOAL #4   Title Pt will demo ability to pick up 1inch object using tip pinch in 4/5 trials.   Baseline uses gross grasp or lateral pinch with difficulty   Time 6   Period Weeks   Status Achieved  10/04/15 met   OT SHORT TERM GOAL #5   Title Pt will be able to grasp/release cylinder object mod I in 4/5 trials.   Baseline min A   Time 6   Period Weeks   Status  Achieved  09/27/15           OT Long Term Goals - 11/20/15 1430    OT LONG TERM GOAL #1   Title Pt will be independent with initial HEP.--check STGs 11/20/15   Baseline dependent   Time 12   Period Weeks   Status On-going   OT LONG TERM GOAL #2   Title Pt will demo at least 120* L shoulder flexion for functional reaching/ADLs without pain.   Baseline 90*   Time 12   Period Weeks   Status Partially Met  11/20/15:  110-120* with no pain   OT LONG TERM GOAL #3   Title Pt will use LUE as gross assist for ADLs at least 75% of the time.   Baseline unable   Time 12   Period Weeks   Status On-going   OT LONG TERM GOAL #4   Title Pt will demo at least 105* L shoulder abduction for ADLs without pain.   Baseline 90*   Time 12   Period Weeks   Status Partially Met  11/20/15:  100-105* with no pain    OT LONG TERM GOAL #5   Title Pt will improve LUE coordination as shown by scoring at least 5 on box and blocks test.   Baseline unable   Time 12   Period Weeks   Status Achieved  11/20/15: 17 blocks               Plan - 11/20/15 1439    Clinical Impression Statement Pt is progressing towards goals with improved LUE functional use and control.   Rehab Potential Good   Clinical Impairments Affecting Rehab Potential pt reports that sister works long hours, financial concerns   OT Frequency 2x / week   OT Duration 12 weeks   OT Treatment/Interventions Cryotherapy;Parrafin;Electrical Stimulation;Fluidtherapy;Moist Heat;Passive range of motion;Therapeutic activities;DME and/or AE instruction;Therapeutic exercises;Iontophoresis;Ultrasound;Neuromuscular education;Manual Therapy;Splinting;Patient/family education;Therapist, nutritional;Therapeutic exercise;Self-care/ADL training;Energy conservation;Contrast Bath;Cognitive remediation/compensation;Visual/perceptual remediation/compensation   Plan check remaining goals and d/c.      Patient will benefit from skilled therapeutic  intervention in order to improve the following deficits and impairments:  Decreased mobility, Decreased strength, Impaired UE functional use, Pain, Decreased knowledge of use of DME, Decreased balance, Impaired tone, Decreased range of motion, Decreased coordination  Visit Diagnosis: Spastic hemiplegia affecting left nondominant side (HCC)  Other lack of coordination  Stiffness of left shoulder, not elsewhere classified    Problem List Patient Active Problem List   Diagnosis Date Noted  . Spastic hemiplegia affecting nondominant side ([place]) 11/02/2015  . Chronic tension-type headache, not intractable 10/18/2015  . Gait disturbance, post-stroke 08/14/2015  . Frozen shoulder syndrome 08/14/2015  . Subacromial impingement of left shoulder 08/14/2015  .  Left shoulder pain 08/10/2015  . Diabetes type 2, controlled ([place]) 07/31/2015  . Eczema 07/31/2015  . Right middle cerebral artery stroke ([place]) 06/14/2015  . Left hemiparesis ([place])   . Left-sided neglect   . Embolic stroke of right basal ganglia ([place]) 06/11/2015  . TIA (transient ischemic attack) 06/09/2013  . Hypokalemia 06/09/2013  . PFO (patent foramen ovale) 06/09/2013  . Hyperlipidemia 12/10/2009  . Essential hypertension 12/10/2009  . Asthma 12/10/2009  . CEREBROVASCULAR ACCIDENT, HX OF 12/10/2009    [facility] 11/20/2015, 3:15 PM  [place] [address] [employer] [place], [place], [address] Phone: [phone]   Fax:  [phone]  Name: Rebecca Yoder MRN: [id number] Date of Birth: 1966-01-04   Vianne Bulls, OTR/L [facility] [address] [place], [place]  [address] [phone] phone [phone] 11/20/2015 3:15 PM

## 2015-11-20 NOTE — Therapy (Signed)
[place] [address] [place], [place], [address] Phone: [phone]   Fax:  [phone]  Physical Therapy Treatment  Patient Details  Name: Rebecca Yoder MRN: [id number] Date of Birth: Jun 20, 1966 Referring Provider: Letta Pate  Encounter Date: 11/20/2015      PT End of Session - 11/20/15 1410    Visit Number 8   Number of Visits 13   Date for PT Re-Evaluation 12/05/15   Authorization Type Medicaid-12 visits requested   Authorization - Visit Number 6   Authorization - Number of Visits 12   PT Start Time 1320   PT Stop Time 1400   PT Time Calculation (min) 40 min   Equipment Utilized During Treatment Gait belt   Activity Tolerance Patient tolerated treatment well   Behavior During Therapy [facility] for tasks assessed/performed      Past Medical History  Diagnosis Date  . ASTHMA 12/10/2009  . CEREBROVASCULAR ACCIDENT, HX OF 12/10/2009  . HYPERLIPIDEMIA 12/10/2009  . HYPERTENSION 12/10/2009  . Complication of anesthesia     "just can't get me up" (06/09/2013)  . Stroke [facility]) 2008    denies residual on 06/09/2013  . Tendonitis of wrist, right   . Embolic stroke of right basal ganglia ([place]) 06/11/2015    with L residual weakness/hemiparesis     Past Surgical History  Procedure Laterality Date  . Ovarian cyst removal  1990's    There were no vitals filed for this visit.      Subjective Assessment - 11/20/15 1324    Subjective Reports that she is doing a lot more to help out at home: baking and doing dishes.   Patient Stated Goals Pt's goal for therapy is to walk better and use hand better.   Currently in Pain? No/denies                              Balance Exercises - 11/20/15 1325    Balance Exercises: Standing   Standing Eyes Opened Narrow base of support (BOS);Other reps (comment)  upper trunk rotations without UE support.  Min Guard.   Step Ups Forward;4 inch;UE support 1   Working on strength and stability with L SLS, Min Guard   Retro Gait Other (comment)  working on balance without UE support Forward/Backwards with cues to increase steplength....   Sidestepping Other (comment)  no UE support working on balance with narrow BOS. Min Guard.           PT Education - 11/20/15 1408    Education provided Yes   Education Details balance strategies with narrow BOS.   Person(s) Educated Patient   Methods Explanation;Demonstration;Tactile cues;Verbal cues   Comprehension Verbalized understanding;Need further instruction          PT Short Term Goals - 11/15/15 0943    PT SHORT TERM GOAL #1   Title Pt will be able to perform HEP with family supervision for improved balance, strength, gait.  TARGET 12/03/15   Baseline Needs cues for proper techniqe-11/15/15   Time 4   Period Weeks   Status Partially Met   PT SHORT TERM GOAL #2   Title Pt will perform at least 6 of 10 reps of sit<>stand transfers with minimal UE support for improved efficiency and safety with gait.   Baseline Supervision for transfers with definite UE support   Time 4   Period Weeks   Status Achieved   PT SHORT TERM  GOAL #3   Title Pt will improve TUG score to less than or equal to 38 seconds for decreased fall risk.   Baseline TUG 38.1 sec with RW 11/15/15   Time 4   Period Weeks   Status Achieved   PT SHORT TERM GOAL #4   Title Pt will improve Berg Balance score to at least 27/56 for decreased fall risk.   Baseline Merrilee Jansky 39/56 11/15/15   Time 4   Period Weeks   Status Achieved           PT Long Term Goals - 11/16/15 1704    PT LONG TERM GOAL #1   Title Pt will verbalize understanding of fall prevention within the home environment.  TARGET 12/05/15   Baseline at fall risk per Merrilee Jansky, TUG scores   Time 8   Period Weeks   Status On-going   PT LONG TERM GOAL #2   Title Pt will improve gait velocity to at least 1.2 ft/sec for improved gait efficiency and safety.   Baseline gait  velocity 0.85 ft/sec   Time 8   Period Weeks   Status On-going   PT LONG TERM GOAL #3   Title Pt will improve TUG score to less than or equal to 30 seconds for decreased fall risk.   Baseline TUG 44.50 sec   Time 8   Period Weeks   Status On-going   PT LONG TERM GOAL #4   Title Pt will improve Berg Balance score to at least 44/56 for decreased fall risk.   Baseline Berg score 39/56 11/15/15   Time 8   Period Weeks   Status Revised   PT LONG TERM GOAL #5   Title Pt will verbalize understanding of community fitness upon D/C from PT.   Baseline no current formal HEP/community fitness   Time 8   Period Weeks   Status On-going               Plan - 11/20/15 1335    Clinical Impression Statement Reports that LLE muscle felt like they were getting more tired and tight with balance activites performed without UE support.  Progressing with standing balance with narrow BOS requiring [place] without UE support.   Rehab Potential Good   PT Frequency --  12 visits over 12 weeks   PT Duration 12 weeks   PT Treatment/Interventions ADLs/Self Care Home Management;Electrical Stimulation;Functional mobility training;Therapeutic activities;Therapeutic exercise;Gait training;DME Instruction;Balance training;Neuromuscular re-education;Patient/family education;Orthotic Fit/Training   PT Next Visit Plan Continue to work on standing balance on compliant surface, SLS and narrow BOS, LLE strengthening towards LTGs.   Consulted and Agree with Plan of Care Patient      Patient will benefit from skilled therapeutic intervention in order to improve the following deficits and impairments:  Abnormal gait, Decreased balance, Decreased mobility, Decreased range of motion, Decreased strength, Difficulty walking, Impaired tone, Pain, Postural dysfunction  Visit Diagnosis: Muscle weakness of lower extremity  Other abnormalities of gait and mobility  Muscle weakness (generalized)  Left hemiparesis  (HCC)  Decreased coordination     Problem List Patient Active Problem List   Diagnosis Date Noted  . Spastic hemiplegia affecting nondominant side ([place]) 11/02/2015  . Chronic tension-type headache, not intractable 10/18/2015  . Gait disturbance, post-stroke 08/14/2015  . Frozen shoulder syndrome 08/14/2015  . Subacromial impingement of left shoulder 08/14/2015  . Left shoulder pain 08/10/2015  . Diabetes type 2, controlled ([place]) 07/31/2015  . Eczema 07/31/2015  . Right middle cerebral artery stroke (  [place]) 06/14/2015  . Left hemiparesis ([place])   . Left-sided neglect   . Embolic stroke of right basal ganglia ([place]) 06/11/2015  . TIA (transient ischemic attack) 06/09/2013  . Hypokalemia 06/09/2013  . PFO (patent foramen ovale) 06/09/2013  . Hyperlipidemia 12/10/2009  . Essential hypertension 12/10/2009  . Asthma 12/10/2009  . CEREBROVASCULAR ACCIDENT, HX OF 12/10/2009    Bjorn Loser, PTA  11/20/2015, 2:14 PM [place] [address] [place], [place], [address] Phone: [phone]   Fax:  [phone]  Name: Rebecca Yoder MRN: [id number] Date of Birth: [birth year]

## 2015-11-22 NOTE — Addendum Note (Signed)
Addended by: Vianne Bulls D on: 11/22/2015 01:05 PM   Modules accepted: Orders

## 2015-11-26 MED FILL — PRAVASTATIN NA 20 MG TAB: 20 | 30 days supply | Qty: 30 | Fill #2

## 2015-11-27 ENCOUNTER — Ambulatory Visit: Payer: Medicaid Other | Admitting: Occupational Therapy

## 2015-11-27 ENCOUNTER — Ambulatory Visit: Payer: Medicaid Other | Admitting: Physical Therapy

## 2015-11-27 ENCOUNTER — Encounter: Payer: Self-pay | Admitting: Occupational Therapy

## 2015-11-27 DIAGNOSIS — R2689 Other abnormalities of gait and mobility: Secondary | ICD-10-CM

## 2015-11-27 DIAGNOSIS — G8194 Hemiplegia, unspecified affecting left nondominant side: Secondary | ICD-10-CM | POA: Diagnosis not present

## 2015-11-27 DIAGNOSIS — M25612 Stiffness of left shoulder, not elsewhere classified: Secondary | ICD-10-CM

## 2015-11-27 DIAGNOSIS — M6281 Muscle weakness (generalized): Secondary | ICD-10-CM

## 2015-11-27 DIAGNOSIS — G8114 Spastic hemiplegia affecting left nondominant side: Secondary | ICD-10-CM

## 2015-11-27 DIAGNOSIS — R278 Other lack of coordination: Secondary | ICD-10-CM

## 2015-11-27 NOTE — Patient Instructions (Signed)
Ways to use your left hand during your day:  The more you use your arm the better it will get!  Bathing in the shower: Hold the washcloth in your left hand and let your right hand be the "helper."  In the clinic today you could wash your legs, your belly, your chest, your face and the lower part of your right arm using the washcloth this way.  Put the brown foam on your toothbrush.  Use your left hand to hold the toothpaste when you take the cap off.  Then use your left hand to hold the toothbrush while you put the toothpaste on the brush.  Meals: Use both hands to pick up and drink from your cup!  Try and do every sip this way.  Practice reaching for the cup and putting it down using both hands. Use your left hand to hold your plate and keep it steady. Try using the red foam on the fork to hold food while you cut it with your right hand.    Housekeeping: Use both hands to fold small things in the laundry like washcloths - sit at table to do this.

## 2015-11-27 NOTE — Therapy (Signed)
[facility] [address] [place], [place], [address] Phone: [phone]   Fax:  [phone]  Occupational Therapy Treatment  Patient Details  Name: Rebecca Yoder MRN: [id number] Date of Birth: Jun 11, 1966 Referring Provider: Dr. Alysia Penna  Encounter Date: 11/27/2015      OT End of Session - 11/27/15 1259    Visit Number 19   Number of Visits 19   Date for OT Re-Evaluation 11/16/15   Authorization Type was Medicaid pending--pt did not want to wait to schedule; now approved for Medicaid   Authorization Time Period CCME APPROVED 12 OT VISITS FROM 2/23-5/17/17   Authorization - Visit Number 12   Authorization - Number of Visits 12   OT Start Time 1016   OT Stop Time 1059   OT Time Calculation (min) 43 min   Activity Tolerance Patient tolerated treatment well      Past Medical History  Diagnosis Date  . ASTHMA 12/10/2009  . CEREBROVASCULAR ACCIDENT, HX OF 12/10/2009  . HYPERLIPIDEMIA 12/10/2009  . HYPERTENSION 12/10/2009  . Complication of anesthesia     "just can't get me up" (06/09/2013)  . Stroke [facility]) 2008    denies residual on 06/09/2013  . Tendonitis of wrist, right   . Embolic stroke of right basal ganglia ([place]) 06/11/2015    with L residual weakness/hemiparesis     Past Surgical History  Procedure Laterality Date  . Ovarian cyst removal  1990's    There were no vitals filed for this visit.      Subjective Assessment - 11/27/15 1021    Subjective  I think my arm is better    Pertinent History hx of multiple CVAs (pt reports 06/11/15 CVA was the 4th),was independent prior to 06/11/15 CVA (living alone, taking care of mother who has CA, driving, working, and using LUE functionally), now living with sister/brother in-law who works a lot   Limitations pt reports no significant deficits from first 3 CVAs   Patient Stated Goals improve LUE functional use, incr ADL independence   Currently in Pain?  No/denies                      OT Treatments/Exercises (OP) - 11/27/15 0001    Neurological Re-education Exercises   Other Exercises 1 Neuro re ed via functional tasks to incorporate and use LUE as gross assist in basic ADL (practiced simulated bathing at shower level, washing face, holding tooth paste while opening, holding toothbrush while putting on paste, drinking from a cup with both hands, opening and closing purse, holding fork while cutting with RUE) and basic IADL (folding small items of laundry).  Pt needs concrete practice to be shown how to use her arm functioanlly. Pt also given activities in writing.                 OT Education - 11/27/15 1255    Education provided Yes   Education Details ways to use LUE functionally    Person(s) Educated Patient   Methods Explanation;Demonstration;Verbal cues;Handout   Comprehension Verbalized understanding;Returned demonstration  after much practice          OT Short Term Goals - 11/27/15 1256    OT SHORT TERM GOAL #1   Title Pt will be independent with initial HEP.--check STGs 10/09/15   Baseline dependent   Time 6   Period Weeks   Status Achieved   OT SHORT TERM GOAL #2   Title Pt will demo  at least 110* L shoulder flexion for functional reaching/ADLs without pain.   Baseline 90*   Time 6   Period Weeks   Status Achieved  10/04/15:  100* with no pain, but min compensation/IR; 11/20/15 110-120* with min compensation/no pain   OT SHORT TERM GOAL #3   Title Pt will demo ability to use LUE as stabilizer for ADLs.   Baseline unable   Time 6   Period Weeks   Status Achieved  10/04/15:  inconsistent; 10/29/15   OT SHORT TERM GOAL #4   Title Pt will demo ability to pick up 1inch object using tip pinch in 4/5 trials.   Baseline uses gross grasp or lateral pinch with difficulty   Time 6   Period Weeks   Status Achieved  10/04/15 met   OT SHORT TERM GOAL #5   Title Pt will be able to grasp/release cylinder  object mod I in 4/5 trials.   Baseline min A   Time 6   Period Weeks   Status Achieved  09/27/15           OT Long Term Goals - 11/27/15 1256    OT LONG TERM GOAL #1   Title Pt will be independent with initial HEP.--check STGs 11/20/15   Baseline dependent   Time 12   Period Weeks   Status Achieved   OT LONG TERM GOAL #2   Title Pt will demo at least 120* L shoulder flexion for functional reaching/ADLs without pain.   Baseline 90*   Time 12   Period Weeks   Status Partially Met  11/20/15:  110-120* with no pain   OT LONG TERM GOAL #3   Title Pt will use LUE as gross assist for ADLs at least 75% of the time.   Baseline unable   Time 12   Period Weeks   Status Partially Met  Pt ABLE to use LUE as gorss assist but needs practice and reminders   OT LONG TERM GOAL #4   Title Pt will demo at least 105* L shoulder abduction for ADLs without pain.   Baseline 90*   Time 12   Period Weeks   Status Partially Met  11/20/15:  100-105* with no pain    OT LONG TERM GOAL #5   Title Pt will improve LUE coordination as shown by scoring at least 5 on box and blocks test.   Baseline unable   Time 12   Period Weeks   Status Achieved  11/20/15: 17 blocks               Plan - 11/27/15 1258    Clinical Impression Statement Pt has met or partially met all STG and LTG's.  Pt is ready for discharge.    Rehab Potential Good   Clinical Impairments Affecting Rehab Potential pt reports that sister works long hours, financial concerns   OT Frequency 2x / week   OT Duration 12 weeks   OT Treatment/Interventions Cryotherapy;Parrafin;Electrical Stimulation;Fluidtherapy;Moist Heat;Passive range of motion;Therapeutic activities;DME and/or AE instruction;Therapeutic exercises;Iontophoresis;Ultrasound;Neuromuscular education;Manual Therapy;Splinting;Patient/family education;Therapist, nutritional;Therapeutic exercise;Self-care/ADL training;Energy conservation;Contrast Bath;Cognitive  remediation/compensation;Visual/perceptual remediation/compensation   Plan d/c from [place] Education issued:  initial HEP 08/28/15; additional HEP 09/11/15, 09/13/15 wrist cock up brace and functional reach [place] and Agree with Plan of Care Patient      Patient will benefit from skilled therapeutic intervention in order to improve the following deficits and impairments:  Decreased mobility, Decreased strength, Impaired  UE functional use, Pain, Decreased knowledge of use of DME, Decreased balance, Impaired tone, Decreased range of motion, Decreased coordination  Visit Diagnosis: Other lack of coordination  Spastic hemiplegia affecting left nondominant side (HCC)  Stiffness of left shoulder, not elsewhere classified  Muscle weakness (generalized)    Problem List Patient Active Problem List   Diagnosis Date Noted  . Spastic hemiplegia affecting nondominant side ([place]) 11/02/2015  . Chronic tension-type headache, not intractable 10/18/2015  . Gait disturbance, post-stroke 08/14/2015  . Frozen shoulder syndrome 08/14/2015  . Subacromial impingement of left shoulder 08/14/2015  . Left shoulder pain 08/10/2015  . Diabetes type 2, controlled ([place]) 07/31/2015  . Eczema 07/31/2015  . Right middle cerebral artery stroke ([place]) 06/14/2015  . Left hemiparesis ([place])   . Left-sided neglect   . Embolic stroke of right basal ganglia ([place]) 06/11/2015  . TIA (transient ischemic attack) 06/09/2013  . Hypokalemia 06/09/2013  . PFO (patent foramen ovale) 06/09/2013  . Hyperlipidemia 12/10/2009  . Essential hypertension 12/10/2009  . Asthma 12/10/2009  . CEREBROVASCULAR ACCIDENT, HX OF 12/10/2009   OCCUPATIONAL THERAPY DISCHARGE SUMMARY  Visits from Start of Care: 19  Current functional level related to goals / functional outcomes: See above   Remaining deficits: L spastic hemiplegia, impaired functional use of LUE   Education / Equipment: HEP Plan: Patient  agrees to discharge.  Patient goals were met. Patient is being discharged due to meeting the stated rehab goals.  ?????      Quay Burow, OTR/L 11/27/2015, 1:01 PM  [facility] [address] [place], [place], [address] Phone: [phone]   Fax:  [phone]  Name: Rebecca Yoder MRN: [id number] Date of Birth: 1966-02-17

## 2015-11-27 NOTE — Therapy (Signed)
[facility] [address] [place], [place], [address] Phone: [phone]   Fax:  [phone]  Physical Therapy Treatment  Patient Details  Name: Rebecca Yoder MRN: [id number] Date of Birth: 09-22-1965 Referring Provider: Letta Pate  Encounter Date: 11/27/2015      PT End of Session - 11/27/15 2142    Visit Number 9   Number of Visits 13   Date for PT Re-Evaluation 12/05/15   Authorization Type Medicaid-12 visits requested   Authorization - Visit Number 7   Authorization - Number of Visits 12   PT Start Time 6962   PT Stop Time 1147   PT Time Calculation (min) 42 min      Past Medical History  Diagnosis Date  . ASTHMA 12/10/2009  . CEREBROVASCULAR ACCIDENT, HX OF 12/10/2009  . HYPERLIPIDEMIA 12/10/2009  . HYPERTENSION 12/10/2009  . Complication of anesthesia     "just can't get me up" (06/09/2013)  . Stroke [facility]) 2008    denies residual on 06/09/2013  . Tendonitis of wrist, right   . Embolic stroke of right basal ganglia ([place]) 06/11/2015    with L residual weakness/hemiparesis     Past Surgical History  Procedure Laterality Date  . Ovarian cyst removal  1990's    There were no vitals filed for this visit.      Subjective Assessment - 11/27/15 2138    Subjective Pt reports no new problems or changes since last visit   Patient Stated Goals Pt's goal for therapy is to walk better and use hand better.   Currently in Pain? No/denies                         [facility] Adult PT Treatment/Exercise - 11/27/15 1125    Ambulation/Gait   Ambulation/Gait Yes   Ambulation/Gait Assistance 5: Supervision   Ambulation/Gait Assistance Details cues to take longer step with RLE and control descension of L foot   Ambulation Distance (Feet) 250 Feet   Assistive device Rolling walker   Gait Pattern Step-through pattern;Decreased stance time - left;Decreased hip/knee flexion - left;Decreased weight shift to left   decreased timing with swing phase L due to tone   Ambulation Surface Level;Indoor   Knee/Hip Exercises: Aerobic   Other Aerobic SciFit level 2 6" with UE's and LE's     TherEx; sit to stand with R foot on balance bubble x 10 reps with min assist - for incr. LLE weight bearing  Step up exercise onto 4" step with LLE for quad strengthening x 10 reps  NeuroRe-ed; L single limb stance activities with UE support inside bars - stepping over and back of bolster inside bars Rockerboard with UE support anterior/posterior with UE support prn Sidestepping 10' x 4 reps with UE support prn  Standing on blue Airex with UE support prn - with head turns side to side and up/down x 5 reps each direction             PT Short Term Goals - 11/15/15 0943    PT SHORT TERM GOAL #1   Title Pt will be able to perform HEP with family supervision for improved balance, strength, gait.  TARGET 12/03/15   Baseline Needs cues for proper techniqe-11/15/15   Time 4   Period Weeks   Status Partially Met   PT SHORT TERM GOAL #2   Title Pt will perform at least 6 of 10 reps of sit<>stand transfers with minimal UE  support for improved efficiency and safety with gait.   Baseline Supervision for transfers with definite UE support   Time 4   Period Weeks   Status Achieved   PT SHORT TERM GOAL #3   Title Pt will improve TUG score to less than or equal to 38 seconds for decreased fall risk.   Baseline TUG 38.1 sec with RW 11/15/15   Time 4   Period Weeks   Status Achieved   PT SHORT TERM GOAL #4   Title Pt will improve Berg Balance score to at least 27/56 for decreased fall risk.   Baseline Merrilee Jansky 39/56 11/15/15   Time 4   Period Weeks   Status Achieved           PT Long Term Goals - 11/16/15 1704    PT LONG TERM GOAL #1   Title Pt will verbalize understanding of fall prevention within the home environment.  TARGET 12/05/15   Baseline at fall risk per Merrilee Jansky, TUG scores   Time 8   Period Weeks   Status  On-going   PT LONG TERM GOAL #2   Title Pt will improve gait velocity to at least 1.2 ft/sec for improved gait efficiency and safety.   Baseline gait velocity 0.85 ft/sec   Time 8   Period Weeks   Status On-going   PT LONG TERM GOAL #3   Title Pt will improve TUG score to less than or equal to 30 seconds for decreased fall risk.   Baseline TUG 44.50 sec   Time 8   Period Weeks   Status On-going   PT LONG TERM GOAL #4   Title Pt will improve Berg Balance score to at least 44/56 for decreased fall risk.   Baseline Berg score 39/56 11/15/15   Time 8   Period Weeks   Status Revised   PT LONG TERM GOAL #5   Title Pt will verbalize understanding of community fitness upon D/C from PT.   Baseline no current formal HEP/community fitness   Time 8   Period Weeks   Status On-going               Plan - 11/27/15 2143    Clinical Impression Statement Pt hesitant to attempt standing balance activities without UE support due to significant fear of falling; gait pattern improved with cues to incr. step length of RLE and control descension of LLE; pt progressing towards goals   Rehab Potential Good   PT Frequency Other (comment)   PT Duration 12 weeks   PT Treatment/Interventions ADLs/Self Care Home Management;Electrical Stimulation;Functional mobility training;Therapeutic activities;Therapeutic exercise;Gait training;DME Instruction;Balance training;Neuromuscular re-education;Patient/family education;Orthotic Fit/Training   PT Next Visit Plan cont gait and balance training   Consulted and Agree with Plan of Care Patient      Patient will benefit from skilled therapeutic intervention in order to improve the following deficits and impairments:  Abnormal gait, Decreased balance, Decreased mobility, Decreased range of motion, Decreased strength, Difficulty walking, Impaired tone, Pain, Postural dysfunction  Visit Diagnosis: Other abnormalities of gait and mobility  Muscle weakness  (generalized)     Problem List Patient Active Problem List   Diagnosis Date Noted  . Spastic hemiplegia affecting nondominant side ([place]) 11/02/2015  . Chronic tension-type headache, not intractable 10/18/2015  . Gait disturbance, post-stroke 08/14/2015  . Frozen shoulder syndrome 08/14/2015  . Subacromial impingement of left shoulder 08/14/2015  . Left shoulder pain 08/10/2015  . Diabetes type 2, controlled ([place]) 07/31/2015  . Eczema  07/31/2015  . Right middle cerebral artery stroke ([place]) 06/14/2015  . Left hemiparesis ([place])   . Left-sided neglect   . Embolic stroke of right basal ganglia ([place]) 06/11/2015  . TIA (transient ischemic attack) 06/09/2013  . Hypokalemia 06/09/2013  . PFO (patent foramen ovale) 06/09/2013  . Hyperlipidemia 12/10/2009  . Essential hypertension 12/10/2009  . Asthma 12/10/2009  . CEREBROVASCULAR ACCIDENT, HX OF 12/10/2009    Alda Lea, PT 11/27/2015, 9:49 PM  [facility] [address] [place], [place], [address] Phone: [phone]   Fax:  [phone]  Name: Rebecca Yoder MRN: [id number] Date of Birth: [birth year]

## 2015-11-29 ENCOUNTER — Encounter: Payer: Self-pay | Admitting: Physical Medicine & Rehabilitation

## 2015-11-29 ENCOUNTER — Ambulatory Visit (HOSPITAL_BASED_OUTPATIENT_CLINIC_OR_DEPARTMENT_OTHER): Payer: Medicaid Other | Admitting: Physical Medicine & Rehabilitation

## 2015-11-29 ENCOUNTER — Encounter: Payer: Medicaid Other | Attending: Physical Medicine & Rehabilitation

## 2015-11-29 VITALS — BP 142/81 | HR 70 | Resp 14

## 2015-11-29 DIAGNOSIS — S43002A Unspecified subluxation of left shoulder joint, initial encounter: Secondary | ICD-10-CM | POA: Insufficient documentation

## 2015-11-29 DIAGNOSIS — Z8673 Personal history of transient ischemic attack (TIA), and cerebral infarction without residual deficits: Secondary | ICD-10-CM | POA: Diagnosis present

## 2015-11-29 DIAGNOSIS — M7542 Impingement syndrome of left shoulder: Secondary | ICD-10-CM | POA: Insufficient documentation

## 2015-11-29 DIAGNOSIS — I69954 Hemiplegia and hemiparesis following unspecified cerebrovascular disease affecting left non-dominant side: Secondary | ICD-10-CM | POA: Diagnosis not present

## 2015-11-29 DIAGNOSIS — M7502 Adhesive capsulitis of left shoulder: Secondary | ICD-10-CM | POA: Diagnosis not present

## 2015-11-29 DIAGNOSIS — R269 Unspecified abnormalities of gait and mobility: Secondary | ICD-10-CM | POA: Insufficient documentation

## 2015-11-29 DIAGNOSIS — G8110 Spastic hemiplegia affecting unspecified side: Secondary | ICD-10-CM

## 2015-11-29 DIAGNOSIS — M25512 Pain in left shoulder: Secondary | ICD-10-CM | POA: Diagnosis not present

## 2015-11-29 DIAGNOSIS — G8194 Hemiplegia, unspecified affecting left nondominant side: Secondary | ICD-10-CM | POA: Diagnosis present

## 2015-11-29 NOTE — Progress Notes (Signed)
Botox Injection for spasticity using needle EMG guidance  Dilution: 50 Units/ml Indication: Severe spasticity which interferes with ADL,mobility and/or  hygiene and is unresponsive to medication management and other conservative care Informed consent was obtained after describing risks and benefits of the procedure with the patient. This includes bleeding, bruising, infection, excessive weakness, or medication side effects. A REMS form is on file and signed. Needle: 85mm 25g needle electrode Number of units per muscle Posterior tibialis 75 units Gastroc 25 units 2 Flexor digitorum longus 75 units All injections were done after obtaining appropriate EMG activity and after negative drawback for blood. The patient tolerated the procedure well. Post procedure instructions were given. A followup appointment was made.

## 2015-11-29 NOTE — Patient Instructions (Signed)

## 2015-12-03 ENCOUNTER — Other Ambulatory Visit: Payer: Self-pay | Admitting: Family Medicine

## 2015-12-03 ENCOUNTER — Ambulatory Visit: Payer: Medicaid Other | Admitting: Physical Therapy

## 2015-12-03 DIAGNOSIS — R2689 Other abnormalities of gait and mobility: Secondary | ICD-10-CM

## 2015-12-03 DIAGNOSIS — G8194 Hemiplegia, unspecified affecting left nondominant side: Secondary | ICD-10-CM | POA: Diagnosis not present

## 2015-12-03 MED FILL — LOSARTAN POTASSIUM 100 MG T: 100 | 30 days supply | Qty: 30 | Fill #4

## 2015-12-03 MED FILL — CLOPIDOGREL 75 MG TABLET: 75 | 30 days supply | Qty: 30 | Fill #0

## 2015-12-03 MED FILL — PANTOPRAZOLE SOD DR 40 MG T: 40 | 30 days supply | Qty: 30 | Fill #0

## 2015-12-03 NOTE — Therapy (Signed)
[facility] [address] [place], [place], [address] Phone: [phone]   Fax:  [phone]  Physical Therapy Treatment  Patient Details  Name: Rebecca Yoder MRN: [id number] Date of Birth: 1965-12-10 Referring Provider: Letta Pate  Encounter Date: 12/03/2015      PT End of Session - 12/03/15 1337    Visit Number 10   Number of Visits 13   Date for PT Re-Evaluation 12/05/15   Authorization Type Medicaid-12 visits requested   Authorization - Visit Number 7   Authorization - Number of Visits 12   PT Start Time 7902   PT Stop Time 1130   PT Time Calculation (min) 45 min   Activity Tolerance Patient tolerated treatment well   Behavior During Therapy [facility] for tasks assessed/performed      Past Medical History  Diagnosis Date  . ASTHMA 12/10/2009  . CEREBROVASCULAR ACCIDENT, HX OF 12/10/2009  . HYPERLIPIDEMIA 12/10/2009  . HYPERTENSION 12/10/2009  . Complication of anesthesia     "just can't get me up" (06/09/2013)  . Stroke [facility]) 2008    denies residual on 06/09/2013  . Tendonitis of wrist, right   . Embolic stroke of right basal ganglia ([place]) 06/11/2015    with L residual weakness/hemiparesis     Past Surgical History  Procedure Laterality Date  . Ovarian cyst removal  1990's    There were no vitals filed for this visit.      Subjective Assessment - 12/03/15 1114    Subjective Reports that she is doing really well and reports that she was able to take a shower ModI wtih just help for set up. "And I am doing more at home."   Patient Stated Goals Pt's goal for therapy is to walk better and use hand better.   Currently in Pain? No/denies            [facility] PT Assessment - 12/03/15 0001    Timed Up and Go Test   TUG Normal TUG   Normal TUG (seconds) 28.8                     [facility] Adult PT Treatment/Exercise - 12/03/15 0001    Ambulation/Gait   Ambulation/Gait Yes   Ambulation/Gait  Assistance 6: Modified independent (Device/Increase time)   Ambulation/Gait Assistance Details No LOB and pt initiated greater steplength with Right.   Ambulation Distance (Feet) 150 Feet   Assistive device Rolling walker   Gait Pattern Step-through pattern;Decreased step length - left;Decreased stride length   Ambulation Surface Level   Gait velocity 31.53 sec= 1.64f/sec                PT Education - 12/03/15 1117    Education provided Yes   Education Details Reviewed  Fall Prevention handout; trialled Walker tray for safe technique in kitchen and POC for next visit; discussed LTGs checked; Walking for exercise vs for functional ADLs.   Person(s) Educated Patient   Methods Explanation   Comprehension Verbalized understanding          PT Short Term Goals - 11/15/15 0943    PT SHORT TERM GOAL #1   Title Pt will be able to perform HEP with family supervision for improved balance, strength, gait.  TARGET 12/03/15   Baseline Needs cues for proper techniqe-11/15/15   Time 4   Period Weeks   Status Partially Met   PT SHORT TERM GOAL #2   Title Pt will perform at least 6  of 10 reps of sit<>stand transfers with minimal UE support for improved efficiency and safety with gait.   Baseline Supervision for transfers with definite UE support   Time 4   Period Weeks   Status Achieved   PT SHORT TERM GOAL #3   Title Pt will improve TUG score to less than or equal to 38 seconds for decreased fall risk.   Baseline TUG 38.1 sec with RW 11/15/15   Time 4   Period Weeks   Status Achieved   PT SHORT TERM GOAL #4   Title Pt will improve Berg Balance score to at least 27/56 for decreased fall risk.   Baseline Merrilee Jansky 39/56 11/15/15   Time 4   Period Weeks   Status Achieved            PT Long Term Goals - 12/03/15 1348    PT LONG TERM GOAL #1   Title Pt will verbalize understanding of fall prevention within the home environment.  TARGET 12/05/15   Baseline at fall risk per Merrilee Jansky, TUG  scores   Time 8   Period Weeks   Status Achieved   PT LONG TERM GOAL #2   Title Pt will improve gait velocity to at least 1.2 ft/sec for improved gait efficiency and safety.   Baseline gait velocity 1.04 ft/se; 12/03/15   Time 8   Period Weeks   Status Partially Met   PT LONG TERM GOAL #3   Title Pt will improve TUG score to less than or equal to 30 seconds for decreased fall risk.   Baseline TUG 28.8 sec; 12/03/15   Time 8   Period Weeks   Status Achieved   PT LONG TERM GOAL #4   Title Pt will improve Berg Balance score to at least 44/56 for decreased fall risk.   Baseline Berg score 39/56 11/15/15   Time 8   Period Weeks   Status Revised   PT LONG TERM GOAL #5   Title Pt will verbalize understanding of community fitness upon D/C from PT.   Baseline no current formal HEP/community fitness   Time 8   Period Weeks   Status On-going             Plan - 12/03/15 1343    Clinical Impression Statement Pt reports  progress with being able to do more daily chores and activities at home to help.  Pt has made progress with gait speed, understanding of safety (understands that using the walker for gait and standing activities is still recommended), and HEP.   Rehab Potential Good   PT Frequency Other (comment)   PT Duration 12 weeks   PT Treatment/Interventions ADLs/Self Care Home Management;Electrical Stimulation;Functional mobility training;Therapeutic activities;Therapeutic exercise;Gait training;DME Instruction;Balance training;Neuromuscular re-education;Patient/family education;Orthotic Fit/Training   PT Next Visit Plan Finish checking LTGs   Consulted and Agree with Plan of Care Patient      Patient will benefit from skilled therapeutic intervention in order to improve the following deficits and impairments:  Abnormal gait, Decreased balance, Decreased mobility, Decreased range of motion, Decreased strength, Difficulty walking, Impaired tone, Pain, Postural dysfunction  Visit  Diagnosis: Other abnormalities of gait and mobility     Problem List Patient Active Problem List   Diagnosis Date Noted  . Spastic hemiplegia affecting nondominant side ([place]) 11/02/2015  . Chronic tension-type headache, not intractable 10/18/2015  . Gait disturbance, post-stroke 08/14/2015  . Frozen shoulder syndrome 08/14/2015  . Subacromial impingement of left shoulder 08/14/2015  . Left shoulder  pain 08/10/2015  . Diabetes type 2, controlled ([place]) 07/31/2015  . Eczema 07/31/2015  . Right middle cerebral artery stroke ([place]) 06/14/2015  . Left hemiparesis ([place])   . Left-sided neglect   . Embolic stroke of right basal ganglia ([place]) 06/11/2015  . TIA (transient ischemic attack) 06/09/2013  . Hypokalemia 06/09/2013  . PFO (patent foramen ovale) 06/09/2013  . Hyperlipidemia 12/10/2009  . Essential hypertension 12/10/2009  . Asthma 12/10/2009  . CEREBROVASCULAR ACCIDENT, HX OF 12/10/2009    Bjorn Loser, PTA  12/03/2015, 1:48 PM [facility] [address] [place], [place], [address] Phone: [phone]   Fax:  [phone]  Name: Rebecca Yoder MRN: [id number] Date of Birth: 02/21/1966

## 2015-12-04 ENCOUNTER — Encounter: Payer: Medicaid Other | Admitting: Occupational Therapy

## 2015-12-11 ENCOUNTER — Encounter: Payer: Self-pay | Admitting: Family Medicine

## 2015-12-11 ENCOUNTER — Ambulatory Visit: Payer: Medicaid Other | Attending: Family Medicine | Admitting: Family Medicine

## 2015-12-11 VITALS — BP 128/82 | HR 95 | Temp 98.1°F | Resp 16 | Ht 65.0 in | Wt 168.0 lb

## 2015-12-11 DIAGNOSIS — I63511 Cerebral infarction due to unspecified occlusion or stenosis of right middle cerebral artery: Secondary | ICD-10-CM | POA: Diagnosis not present

## 2015-12-11 DIAGNOSIS — I10 Essential (primary) hypertension: Secondary | ICD-10-CM

## 2015-12-11 DIAGNOSIS — Z8673 Personal history of transient ischemic attack (TIA), and cerebral infarction without residual deficits: Secondary | ICD-10-CM | POA: Insufficient documentation

## 2015-12-11 DIAGNOSIS — Z79899 Other long term (current) drug therapy: Secondary | ICD-10-CM | POA: Diagnosis not present

## 2015-12-11 MED ORDER — PRAVASTATIN SODIUM 20 MG PO TABS: 20.0000 mg | ORAL_TABLET | Freq: Every day | ORAL | Status: DC

## 2015-12-11 MED ORDER — HYDRALAZINE HCL 10 MG PO TABS: 10.0000 mg | ORAL_TABLET | Freq: Three times a day (TID) | ORAL | Status: DC

## 2015-12-11 MED ORDER — AMLODIPINE BESYLATE 10 MG PO TABS: 10.0000 mg | ORAL_TABLET | Freq: Every day | ORAL | Status: DC

## 2015-12-11 MED ORDER — CLOPIDOGREL BISULFATE 75 MG PO TABS: 75.0000 mg | ORAL_TABLET | Freq: Every day | ORAL | Status: DC

## 2015-12-11 MED ORDER — LOSARTAN POTASSIUM 100 MG PO TABS: 100.0000 mg | ORAL_TABLET | Freq: Every day | ORAL | Status: DC

## 2015-12-11 MED FILL — AMLODIPINE BESYLATE 10 MG T: 10 | 30 days supply | Qty: 30 | Fill #0

## 2015-12-11 MED FILL — hydrALAZINE HCL 10 MG TABS: 10 | 30 days supply | Qty: 90 | Fill #0

## 2015-12-11 NOTE — Patient Instructions (Addendum)
Rebecca Yoder was seen today for hypertension.  Diagnoses and all orders for this visit:  Essential hypertension -     losartan (COZAAR) 100 MG tablet; Take 1 tablet (100 mg total) by mouth daily. -     amLODipine (NORVASC) 10 MG tablet; Take 1 tablet (10 mg total) by mouth daily. -     hydrALAZINE (APRESOLINE) 10 MG tablet; Take 1 tablet (10 mg total) by mouth 3 (three) times daily.  Right middle cerebral artery stroke (HCC) -     pravastatin (PRAVACHOL) 20 MG tablet; Take 1 tablet (20 mg total) by mouth daily. -     clopidogrel (PLAVIX) 75 MG tablet; Take 1 tablet (75 mg total) by mouth daily.   F/u in 3 months for HTN,  Hx of stroke, A1c check   Dr. Adrian Blackwater

## 2015-12-11 NOTE — Progress Notes (Signed)
Subjective:  Patient ID: Rebecca Yoder, female    DOB: April 19, 1966  Age: 50 y.o. MRN: [id number]  CC: Hypertension   HPI KATELEIGH GALLIA has hx of stroke and L hemiparesis  presents for   1. CHRONIC HYPERTENSION  Disease Monitoring  Blood pressure range: not monitoring   Chest pain: no   Dyspnea: no   Claudication: no   Medication compliance: yes  Medication Side Effects  Lightheadedness: no   Urinary frequency: no   Edema: no   2. Stroke: L sided weakness in improving. She has graduated from wheelchair to walker. She has disability. She previously worked in a [employer]. She will not return to work. She is compliant with statin and plavix.   Social History  Substance Use Topics  . Smoking status: Never Smoker   . Smokeless tobacco: Never Used  . Alcohol Use: 0.0 oz/week    0 Standard drinks or equivalent per week     Comment: 06/09/2013 "might drink a couple times/yr"    Outpatient Prescriptions Prior to Visit  Medication Sig Dispense Refill  . acetaminophen (TYLENOL) 325 MG tablet Take 2 tablets (650 mg total) by mouth every 6 (six) hours as needed. 20 tablet 0  . acetaminophen-codeine (TYLENOL #3) 300-30 MG tablet Take 1-2 tablets by mouth every 8 (eight) hours as needed for moderate pain. 60 tablet 2  . albuterol (PROAIR HFA) 108 (90 BASE) MCG/ACT inhaler Inhale 2 puffs into the lungs every 6 (six) hours as needed for wheezing. 9 g 5  . amLODipine (NORVASC) 10 MG tablet Take 1 tablet (10 mg total) by mouth daily. 30 tablet 5  . clopidogrel (PLAVIX) 75 MG tablet TAKE 1 TABLET BY MOUTH DAILY. 30 tablet 2  . fluconazole (DIFLUCAN) 150 MG tablet Take 1 tablet (150 mg total) by mouth once. 1 tablet 0  . hydrALAZINE (APRESOLINE) 10 MG tablet Take 1 tablet (10 mg total) by mouth 3 (three) times daily. 90 tablet 2  . losartan (COZAAR) 100 MG tablet Take 1 tablet (100 mg total) by mouth daily. 30 tablet 5  . Multiple Vitamin (MULTIVITAMIN WITH  MINERALS) TABS tablet Take 1 tablet by mouth daily.    . pantoprazole (PROTONIX) 40 MG tablet TAKE 1 TABLET BY MOUTH DAILY. 30 tablet 2  . pravastatin (PRAVACHOL) 20 MG tablet Take 1 tablet (20 mg total) by mouth daily. 30 tablet 2  . triamcinolone ointment (KENALOG) 0.5 % Apply 1 application topically 2 (two) times daily. 30 g 0   No facility-administered medications prior to visit.    ROS Review of Systems  Constitutional: Negative for fever and chills.  Eyes: Negative for visual disturbance.  Respiratory: Negative for shortness of breath.   Cardiovascular: Negative for chest pain.  Gastrointestinal: Negative for abdominal pain and blood in stool.  Endocrine: Negative for polydipsia, polyphagia and polyuria.  Genitourinary: Negative for frequency.  Musculoskeletal: Negative for back pain and arthralgias.  Skin: Negative for rash.  Allergic/Immunologic: Negative for immunocompromised state.  Neurological: Positive for weakness (L sided ). Negative for headaches.  Hematological: Negative for adenopathy. Does not bruise/bleed easily.  Psychiatric/Behavioral: Negative for suicidal ideas and dysphoric mood.    Objective:  BP 128/82 mmHg  Pulse 95  Temp(Src) 98.1 F (36.7 C) (Oral)  Resp 16  Ht 5\' 5"  (1.651 m)  Wt 168 lb (76.204 kg)  BMI 27.96 kg/m2  SpO2 96%  LMP 11/23/2015  BP/Weight 12/11/2015 11/29/2015 123456  Systolic BP 0000000 [id number]  Diastolic  BP 82 81 94  Wt. (Lbs) 168 - -  BMI 27.96 - -     Physical Exam  Constitutional: She is oriented to person, place, and time. She appears well-developed and well-nourished. No distress.  HENT:  Head: Normocephalic and atraumatic.  Cardiovascular: Normal rate, regular rhythm, normal heart sounds and intact distal pulses.   Pulmonary/Chest: Effort normal and breath sounds normal.  Musculoskeletal: She exhibits no edema.       Left shoulder: She exhibits decreased range of motion and decreased strength. She exhibits no  tenderness, no bony tenderness, no swelling, no effusion, no crepitus, no deformity, no laceration and normal pulse.       Left hip: She exhibits decreased strength.  Neurological: She is alert and oriented to person, place, and time.  Skin: Skin is warm and dry. No rash noted.  Psychiatric: She has a normal mood and affect.   Lab Results  Component Value Date   HGBA1C 5.8 10/18/2015     Assessment & Plan:   There are no diagnoses linked to this encounter.  No orders of the defined types were placed in this encounter.    Follow-up: No Follow-up on file.   Boykin Nearing MD

## 2015-12-11 NOTE — Assessment & Plan Note (Signed)
Well-controlled.  Continue current regimen. 

## 2015-12-11 NOTE — Assessment & Plan Note (Signed)
S/p stroke with L hemiparesis that is improving.  HTN  Well controlled now A1c was 5.8 on last check Compliant with plavix and pravastatin  Plan to continue current regimen I agree that working as a [employer] is no longer an option for her Plan for lipids and A1c check at next visit

## 2015-12-12 ENCOUNTER — Ambulatory Visit: Payer: Medicaid Other | Attending: Physical Medicine & Rehabilitation | Admitting: Physical Therapy

## 2015-12-12 DIAGNOSIS — M6281 Muscle weakness (generalized): Secondary | ICD-10-CM

## 2015-12-12 DIAGNOSIS — R2689 Other abnormalities of gait and mobility: Secondary | ICD-10-CM | POA: Insufficient documentation

## 2015-12-12 NOTE — Patient Instructions (Signed)
HIP / KNEE: Extension - Sit to Stand    Sitting on your hospital bed, with the bed elevated to 23-24 inches.  Lean chest forward, raise hips up from surface. Straighten hips and knees to stand tall for 3 seconds. Weight bear equally on left and right sides. Backs of legs should not push off surface.  Then stick your bottom out to sit.  10___ reps per set, __2 sets per day.  Copyright  VHI. All rights reserved.

## 2015-12-13 NOTE — Therapy (Signed)
[facility] [address] [place], [place], [address] Phone: [phone]   Fax:  [phone]  Physical Therapy Treatment  Patient Details  Name: Rebecca Yoder MRN: [id number] Date of Birth: 01/17/1966 Referring Provider: Letta Pate  Encounter Date: 12/12/2015      PT End of Session - 12/13/15 2154    Visit Number 11   Number of Visits 13   Date for PT Re-Evaluation 12/05/15   Authorization Type Medicaid-12 visits requested   Authorization - Visit Number 7   Authorization - Number of Visits 12   PT Start Time 9741   PT Stop Time 1227   PT Time Calculation (min) 40 min   Activity Tolerance Patient tolerated treatment well   Behavior During Therapy [facility] for tasks assessed/performed      Past Medical History  Diagnosis Date  . ASTHMA 12/10/2009  . CEREBROVASCULAR ACCIDENT, HX OF 12/10/2009  . HYPERLIPIDEMIA 12/10/2009  . HYPERTENSION 12/10/2009  . Complication of anesthesia     "just can't get me up" (06/09/2013)  . Stroke [facility] 2008    denies residual on 06/09/2013  . Tendonitis of wrist, right   . Embolic stroke of right basal ganglia ([place]) 06/11/2015    with L residual weakness/hemiparesis     Past Surgical History  Procedure Laterality Date  . Ovarian cyst removal  1990's  . Cosmetic surgery      There were no vitals filed for this visit.                       [facility] Adult PT Treatment/Exercise - 12/13/15 2147    Transfers   Transfers Sit to Stand;Stand to Sit   Sit to Stand 6: Modified independent (Device/Increase time)   Stand to Sit 6: Modified independent (Device/Increase time);With upper extremity assist;To chair/3-in-1   Number of Reps 10 reps   Comments From elevated mat surface (23"), repeated sit<>stand x 10 reps with cues for equal weightshifting through bilateral lower extremities   Ambulation/Gait   Ambulation/Gait Yes   Ambulation/Gait Assistance 6: Modified independent  (Device/Increase time)   Ambulation Distance (Feet) 110 Feet  x 2   Assistive device Rolling walker   Gait Pattern Step-through pattern;Decreased step length - left;Decreased stride length   Ambulation Surface Level;Indoor   Gait velocity 30.78 sec = 1.07 ft/sec   Standardized Balance Assessment   Standardized Balance Assessment Berg Balance Test   Berg Balance Test   Sit to Stand Able to stand  independently using hands   Standing Unsupported Able to stand safely 2 minutes   Sitting with Back Unsupported but Feet Supported on Floor or Stool Able to sit safely and securely 2 minutes   Stand to Sit Sits safely with minimal use of hands   Transfers Able to transfer safely, definite need of hands   Standing Unsupported with Eyes Closed Able to stand 10 seconds safely   Standing Ubsupported with Feet Together Able to place feet together independently and stand for 1 minute with supervision   From Standing, Reach Forward with Outstretched Arm Can reach forward >12 cm safely (5")   From Standing Position, Pick up Object from Floor Able to pick up shoe, needs supervision   From Standing Position, Turn to Look Behind Over each Shoulder Looks behind from both sides and weight shifts well   Turn 360 Degrees Able to turn 360 degrees safely but slowly   Standing Unsupported, Alternately Place Feet on Step/Stool Needs assistance  to keep from falling or unable to try   Standing Unsupported, One Foot in [place] to take small step independently and hold 30 seconds   Standing on One Leg Tries to lift leg/unable to hold 3 seconds but remains standing independently   Total Score 40   Self-Care   Self-Care Other Self-Care Comments   Other Self-Care Comments  Provided patient with education on [facility], use of exercise equipment and fitness room upon D/C from PT.  Discussed pt's progress towards goals, plans for discharge.                PT Education - 12/13/15 2153    Education  provided Yes   Education Details [employer], progress towards goals, plans for discharge this visit; HEP with sit<>stand   Person(s) Educated Patient   Methods Explanation;Demonstration;Handout   Comprehension Verbalized understanding          PT Short Term Goals - 11/15/15 0943    PT SHORT TERM GOAL #1   Title Pt will be able to perform HEP with family supervision for improved balance, strength, gait.  TARGET 12/03/15   Baseline Needs cues for proper techniqe-11/15/15   Time 4   Period Weeks   Status Partially Met   PT SHORT TERM GOAL #2   Title Pt will perform at least 6 of 10 reps of sit<>stand transfers with minimal UE support for improved efficiency and safety with gait.   Baseline Supervision for transfers with definite UE support   Time 4   Period Weeks   Status Achieved   PT SHORT TERM GOAL #3   Title Pt will improve TUG score to less than or equal to 38 seconds for decreased fall risk.   Baseline TUG 38.1 sec with RW 11/15/15   Time 4   Period Weeks   Status Achieved   PT SHORT TERM GOAL #4   Title Pt will improve Berg Balance score to at least 27/56 for decreased fall risk.   Baseline Merrilee Jansky 39/56 11/15/15   Time 4   Period Weeks   Status Achieved           PT Long Term Goals - 12/13/15 2155    PT LONG TERM GOAL #1   Title Pt will verbalize understanding of fall prevention within the home environment.  TARGET 12/05/15   Baseline at fall risk per Merrilee Jansky, TUG scores   Time 8   Period Weeks   Status Achieved   PT LONG TERM GOAL #2   Title Pt will improve gait velocity to at least 1.2 ft/sec for improved gait efficiency and safety.   Baseline gait velocity 1.04 ft/se; 12/03/15; 1.07 ft/sec   Time 8   Period Weeks   Status Not Met   PT LONG TERM GOAL #3   Title Pt will improve TUG score to less than or equal to 30 seconds for decreased fall risk.   Baseline TUG 28.8 sec; 12/03/15   Time 8   Period Weeks   Status Achieved   PT LONG TERM GOAL #4    Title Pt will improve Berg Balance score to at least 44/56 for decreased fall risk.   Baseline Berg score 40/56   Time 8   Period Weeks   Status Not Met   PT LONG TERM GOAL #5   Title Pt will verbalize understanding of community fitness upon D/C from PT.   Baseline no current formal HEP/community fitness   Time 8  Period Weeks   Status Achieved               Plan - 12/13/15 2154-10-22    Clinical Impression Statement Pt has met LTG #1, 3, 5.  LTG #2, 4 not met.  Pt has made significant progress during therapy, and is motivated to continue HEP and community fitness after discharge.  Pt has reported increased walking distance and time at home for exercise and improved confidence with gait.  Pt is appropriate for discharge at this time.   Rehab Potential Good   PT Frequency Other (comment)   PT Duration 12 weeks   PT Treatment/Interventions ADLs/Self Care Home Management;Electrical Stimulation;Functional mobility training;Therapeutic activities;Therapeutic exercise;Gait training;DME Instruction;Balance training;Neuromuscular re-education;Patient/family education;Orthotic Fit/Training   PT Next Visit Plan Discharge this visit   Consulted and Agree with Plan of Care Patient      Patient will benefit from skilled therapeutic intervention in order to improve the following deficits and impairments:  Abnormal gait, Decreased balance, Decreased mobility, Decreased range of motion, Decreased strength, Difficulty walking, Impaired tone, Pain, Postural dysfunction  Visit Diagnosis: Other abnormalities of gait and mobility  Muscle weakness of lower extremity       G-Codes - 12/23/15 21-Oct-2157    Functional Assessment Tool Used Gait velocity 1.07 ft/sec, Berg 40/56, TUG 28.8 sec   Functional Limitation Mobility: Walking and moving around   Mobility: Walking and Moving Around Goal Status [phone]) At least 20 percent but less than 40 percent impaired, limited or restricted   Mobility: Walking and  Moving Around Discharge Status [phone]) At least 20 percent but less than 40 percent impaired, limited or restricted      Problem List Patient Active Problem List   Diagnosis Date Noted  . Spastic hemiplegia affecting nondominant side ([place]) 11/02/2015  . Gait disturbance, post-stroke 08/14/2015  . Frozen shoulder syndrome 08/14/2015  . Subacromial impingement of left shoulder 08/14/2015  . Left shoulder pain 08/10/2015  . Diabetes type 2, controlled ([place]) 07/31/2015  . Eczema 07/31/2015  . Right middle cerebral artery stroke ([place]) 06/14/2015  . Left hemiparesis ([place])   . Left-sided neglect   . Embolic stroke of right basal ganglia ([place]) 06/11/2015  . TIA (transient ischemic attack) 06/09/2013  . PFO (patent foramen ovale) 06/09/2013  . Hyperlipidemia 12/10/2009  . Essential hypertension 12/10/2009  . Asthma 12/10/2009  . History of cardiovascular disorder 12/10/2009    Kyan Giannone W. 12/13/2015, 10:11 PM  Frazier Butt., PT  [facility] [address] [place], [place], [address] Phone: [phone]   Fax:  [phone]  Name: TRISA CRANOR MRN: [id number] Date of Birth: 05/29/1966  PHYSICAL THERAPY DISCHARGE SUMMARY  Visits from Start of Care: 11  Current functional level related to goals / functional outcomes:     PT Long Term Goals - 12/13/15 2155    PT LONG TERM GOAL #1   Title Pt will verbalize understanding of fall prevention within the home environment.  TARGET 12/05/15   Baseline at fall risk per Merrilee Jansky, TUG scores   Time 8   Period Weeks   Status Achieved   PT LONG TERM GOAL #2   Title Pt will improve gait velocity to at least 1.2 ft/sec for improved gait efficiency and safety.   Baseline gait velocity 1.04 ft/se; 12/03/15; 1.07 ft/sec   Time 8   Period Weeks   Status Not Met   PT LONG TERM GOAL #3   Title Pt will improve TUG score to less  than or equal to 30 seconds for decreased fall risk.    Baseline TUG 28.8 sec; 12/03/15   Time 8   Period Weeks   Status Achieved   PT LONG TERM GOAL #4   Title Pt will improve Berg Balance score to at least 44/56 for decreased fall risk.   Baseline Berg score 40/56   Time 8   Period Weeks   Status Not Met   PT LONG TERM GOAL #5   Title Pt will verbalize understanding of community fitness upon D/C from PT.   Baseline no current formal HEP/community fitness   Time 8   Period Weeks   Status Achieved    Pt has improved gait velocity, TUG and Berg Balance scores and is ambulating more frequently and longer distances at home.   Remaining deficits: Decreased LLE strength, decreased balance, abnormality of gait   Education / Equipment: Pt has bee educated in HEP, fall prevention, community fitness.  Plan: Patient agrees to discharge.  Patient goals were not met. Patient is being discharged due to being pleased with the current functional level.  ?????    Mady Haagensen, PT 12/13/2015 10:17 PM Phone: [phone] Fax: [phone]

## 2015-12-25 MED FILL — PRAVASTATIN NA 20 MG TAB: 20 | 30 days supply | Qty: 30 | Fill #0

## 2015-12-31 MED FILL — CLOPIDOGREL 75 MG TABLET: 75 | 30 days supply | Qty: 30 | Fill #1

## 2015-12-31 MED FILL — PANTOPRAZOLE SOD DR 40 MG T: 40 | 30 days supply | Qty: 30 | Fill #1

## 2015-12-31 MED FILL — LOSARTAN POTASSIUM 100 MG T: 100 | 30 days supply | Qty: 30 | Fill #5

## 2016-01-08 ENCOUNTER — Telehealth: Payer: Self-pay | Admitting: Neurology

## 2016-01-08 NOTE — Telephone Encounter (Signed)
Message For: [facility]                  Taken 30-MAY-17 at  8:27AM by ACT ------------------------------------------------------------  Rebecca Yoder, [facility]             CID  [id number]   Patient  SAME                  [name]         Area Code  336  Phone#  [phone] *  DOB  [phone]     Rebecca Yoder                                                                          Disp:Y/N  N  If Y = C/B If No Response In 51minutes  ============================================================  Please note: dob listed about is not right. I spoke with the pt and gave appt date and time. She expressed understanding

## 2016-01-10 ENCOUNTER — Ambulatory Visit: Payer: Medicaid Other | Admitting: Physical Medicine & Rehabilitation

## 2016-01-11 ENCOUNTER — Ambulatory Visit (HOSPITAL_BASED_OUTPATIENT_CLINIC_OR_DEPARTMENT_OTHER): Payer: Medicaid Other | Admitting: Physical Medicine & Rehabilitation

## 2016-01-11 ENCOUNTER — Encounter: Payer: Medicaid Other | Attending: Physical Medicine & Rehabilitation

## 2016-01-11 ENCOUNTER — Encounter: Payer: Self-pay | Admitting: Physical Medicine & Rehabilitation

## 2016-01-11 VITALS — BP 156/91 | HR 97 | Resp 14

## 2016-01-11 DIAGNOSIS — M7502 Adhesive capsulitis of left shoulder: Secondary | ICD-10-CM | POA: Diagnosis not present

## 2016-01-11 DIAGNOSIS — G8110 Spastic hemiplegia affecting unspecified side: Secondary | ICD-10-CM

## 2016-01-11 DIAGNOSIS — M25512 Pain in left shoulder: Secondary | ICD-10-CM | POA: Insufficient documentation

## 2016-01-11 DIAGNOSIS — S43002A Unspecified subluxation of left shoulder joint, initial encounter: Secondary | ICD-10-CM | POA: Diagnosis not present

## 2016-01-11 DIAGNOSIS — G8194 Hemiplegia, unspecified affecting left nondominant side: Secondary | ICD-10-CM | POA: Diagnosis present

## 2016-01-11 DIAGNOSIS — I69954 Hemiplegia and hemiparesis following unspecified cerebrovascular disease affecting left non-dominant side: Secondary | ICD-10-CM | POA: Diagnosis not present

## 2016-01-11 DIAGNOSIS — M7542 Impingement syndrome of left shoulder: Secondary | ICD-10-CM | POA: Insufficient documentation

## 2016-01-11 DIAGNOSIS — Z8673 Personal history of transient ischemic attack (TIA), and cerebral infarction without residual deficits: Secondary | ICD-10-CM | POA: Diagnosis present

## 2016-01-11 DIAGNOSIS — R269 Unspecified abnormalities of gait and mobility: Secondary | ICD-10-CM | POA: Insufficient documentation

## 2016-01-11 NOTE — Progress Notes (Signed)
Subjective:    Patient ID: Rebecca Yoder, female    DOB: 17-Jul-1966, 50 y.o.   MRN: [id number]  HPI Patient is here to follow-up after botulinum toxin injection performed on 11/29/2015. She is very satisfied with the results. She feels like she can ambulate without her brace for short distances better now. Her foot does not turn over quite as much. Her toes do not curl quite as much. She has had no falls. She generally uses the AFO as well as a shoe to ambulate but does not do so when she gets up at night to go to the bathroom. No falls Muscles injected were Posterior tibialis 75 units Gastroc 25 units 2 Flexor digitorum longus 75 units Pain Inventory Average Pain 0 Pain Right Now 0 My pain is NA  In the last 24 hours, has pain interfered with the following? General activity 5 Relation with others 0 Enjoyment of life 6 What TIME of day is your pain at its worst? NA Sleep (in general) Poor  Pain is worse with: NA Pain improves with: rest Relief from Meds: NA  Mobility use a walker ability to climb steps?  yes do you drive?  no use a wheelchair transfers alone Do you have any goals in this area?  yes  Function disabled: date disabled NA Do you have any goals in this area?  yes  Neuro/Psych trouble walking  Prior Studies Any changes since last visit?  no  Physicians involved in your care Any changes since last visit?  no   History reviewed. No pertinent family history. Social History   Social History  . Marital Status: Single    Spouse Name: N/A  . Number of Children: 0  . Years of Education: 12th   Occupational History  . [employer]    Social History Main Topics  . Smoking status: Never Smoker   . Smokeless tobacco: Never Used  . Alcohol Use: 0.0 oz/week    0 Standard drinks or equivalent per week     Comment: 06/09/2013 "might drink a couple times/yr"  . Drug Use: No  . Sexual Activity: Yes   Other Topics Concern  . None   Social History  Narrative   Past Surgical History  Procedure Laterality Date  . Ovarian cyst removal  1990's  . Cosmetic surgery     Past Medical History  Diagnosis Date  . ASTHMA 12/10/2009  . CEREBROVASCULAR ACCIDENT, HX OF 12/10/2009  . HYPERLIPIDEMIA 12/10/2009  . HYPERTENSION 12/10/2009  . Complication of anesthesia     "just can't get me up" (06/09/2013)  . Stroke [facility]) 2008    denies residual on 06/09/2013  . Tendonitis of wrist, right   . Embolic stroke of right basal ganglia ([place]) 06/11/2015    with L residual weakness/hemiparesis    BP 156/91 mmHg  Pulse 97  Resp 14  SpO2 97%  LMP 12/23/2015  Opioid Risk Score:   Fall Risk Score:  `1  Depression screen PHQ 2/9  Depression screen [facility] 2/9 12/11/2015 08/14/2015 08/10/2015 07/31/2015 07/11/2015  Decreased Interest 3 3 0 0 0  Down, Depressed, Hopeless 0 0 0 0 0  PHQ - 2 Score 3 3 0 0 0  Altered sleeping 1 1 - - -  Tired, decreased energy 0 2 - - -  Change in appetite 0 1 - - -  Feeling bad or failure about yourself  0 0 - - -  Trouble concentrating 0 0 - - -  Moving slowly  or fidgety/restless 2 0 - - -  Suicidal thoughts 0 0 - - -  PHQ-9 Score 6 7 - - -  Difficult doing work/chores - Not difficult at all - - -     Review of Systems  Musculoskeletal: Positive for gait problem.  All other systems reviewed and are negative.      Objective:   Physical Exam Gen. No acute distress Mood and affect are appropriate Patient has Ashworth grade 1 spasticity in the gastrocs as well as in the tibialis posterior as well as toe flexors in the left lower extremity. With standing she has activation of the flexor hallucis longus as well as flexor digitorum longus. There is minimal activation of the tibialis posterior muscle. She can ambulate short distances with no significant foot inversion. Left upper extremity has 4 minus strength in the deltoid, biceps, triceps, grip, decreased fine motor in the left hand. She has 2 minus in the left ankle  dorsiflexor 4 minus in the knee extensor and hip flexor       Assessment & Plan:  1. Left spastic hemiplegia secondary to right M1 infarct in November 2016. She's had a good response to Botox in the left lower extremity. Will continue with current dosing. Consideration to include flexor hallucis longus in future injections.  Will repeat Botox 6 weeks with the following dosing and muscle groups: Posterior tibialis 75 units Gastroc 25 units 2 Flexor digitorum longus 75 units

## 2016-01-16 ENCOUNTER — Ambulatory Visit (INDEPENDENT_AMBULATORY_CARE_PROVIDER_SITE_OTHER): Payer: Medicaid Other | Admitting: Neurology

## 2016-01-16 ENCOUNTER — Encounter: Payer: Self-pay | Admitting: Neurology

## 2016-01-16 VITALS — BP 114/79 | HR 82 | Ht 65.0 in | Wt 170.8 lb

## 2016-01-16 DIAGNOSIS — G8110 Spastic hemiplegia affecting unspecified side: Secondary | ICD-10-CM | POA: Diagnosis not present

## 2016-01-16 NOTE — Patient Instructions (Signed)
I had a long d/w patient about her recent stroke, risk for recurrent stroke/TIAs, personally independently reviewed imaging studies and stroke evaluation results and answered questions.Continue clopidogrel 75 mg daily  for secondary stroke prevention and maintain strict control of hypertension with blood pressure goal below 130/90, diabetes with hemoglobin A1c goal below 6.5% and lipids with LDL cholesterol goal below 70 mg/dL. I also advised the patient to eat a healthy diet with plenty of whole grains, cereals, fruits and vegetables, exercise regularly and maintain ideal body weight .she was counseled to use a cane and/or a time when walking outdoors. Followup in the future with  Stroke nurse practitioner in one year or call earlier if necessary  Stroke Prevention Some medical conditions and behaviors are associated with an increased chance of having a stroke. You may prevent a stroke by making healthy choices and managing medical conditions. HOW CAN I REDUCE MY RISK OF HAVING A STROKE?   Stay physically active. Get at least 30 minutes of activity on most or all days.  Do not smoke. It may also be helpful to avoid exposure to secondhand smoke.  Limit alcohol use. Moderate alcohol use is considered to be:  No more than 2 drinks per day for men.  No more than 1 drink per day for nonpregnant women.  Eat healthy foods. This involves:  Eating 5 or more servings of fruits and vegetables a day.  Making dietary changes that address high blood pressure (hypertension), high cholesterol, diabetes, or obesity.  Manage your cholesterol levels.  Making food choices that are high in fiber and low in saturated fat, trans fat, and cholesterol may control cholesterol levels.  Take any prescribed medicines to control cholesterol as directed by your health care provider.  Manage your diabetes.  Controlling your carbohydrate and sugar intake is recommended to manage diabetes.  Take any prescribed  medicines to control diabetes as directed by your health care provider.  Control your hypertension.  Making food choices that are low in salt (sodium), saturated fat, trans fat, and cholesterol is recommended to manage hypertension.  Ask your health care provider if you need treatment to lower your blood pressure. Take any prescribed medicines to control hypertension as directed by your health care provider.  If you are 46-32 years of age, have your blood pressure checked every 3-5 years. If you are 106 years of age or older, have your blood pressure checked every year.  Maintain a healthy weight.  Reducing calorie intake and making food choices that are low in sodium, saturated fat, trans fat, and cholesterol are recommended to manage weight.  Stop drug abuse.  Avoid taking birth control pills.  Talk to your health care provider about the risks of taking birth control pills if you are over 69 years old, smoke, get migraines, or have ever had a blood clot.  Get evaluated for sleep disorders (sleep apnea).  Talk to your health care provider about getting a sleep evaluation if you snore a lot or have excessive sleepiness.  Take medicines only as directed by your health care provider.  For some people, aspirin or blood thinners (anticoagulants) are helpful in reducing the risk of forming abnormal blood clots that can lead to stroke. If you have the irregular heart rhythm of atrial fibrillation, you should be on a blood thinner unless there is a good reason you cannot take them.  Understand all your medicine instructions.  Make sure that other conditions (such as anemia or atherosclerosis) are addressed. SEEK  IMMEDIATE MEDICAL CARE IF:   You have sudden weakness or numbness of the face, arm, or leg, especially on one side of the body.  Your face or eyelid droops to one side.  You have sudden confusion.  You have trouble speaking (aphasia) or understanding.  You have sudden trouble  seeing in one or both eyes.  You have sudden trouble walking.  You have dizziness.  You have a loss of balance or coordination.  You have a sudden, severe headache with no known cause.  You have new chest pain or an irregular heartbeat. Any of these symptoms may represent a serious problem that is an emergency. Do not wait to see if the symptoms will go away. Get medical help at once. Call your local emergency services (911 in [place] Do not drive yourself to the hospital.   This information is not intended to replace advice given to you by your health care provider. Make sure you discuss any questions you have with your health care provider.   Document Released: 09/04/2004 Document Revised: 08/18/2014 Document Reviewed: 01/28/2013 Elsevier Interactive Patient Education [employer].

## 2016-01-16 NOTE — Progress Notes (Signed)
[facility] [address] [place]. [place] [address] [phone]       OFFICE FOLLOW-UP NOTE  [name] Date of Birth:  Jul 07, 1966 Medical Record Number:  [id number]   HPI: 66 year lady with right brain subcortical infarct in October 2014 as well as remote right frontal insular infarct of cryptogenic etiology. Questionable small left caudate lacunar infarct. Vascular risk factors of Hypertension and Hyperlipidimia.Questionable PFO on TEE in 2008 but not seen on TCD Bubble study. Update 06/29/13 :She is seen for first office f/u after [facility] admission for stroke on 06/09/13. She developed left sided weakness at work and slurred speech.Symptoms were improving by the time she reached the hospital.Ct head showed remote right frontal infarct while MRI showed small right external capsule and right corona radiata infarct.questionable tiny left caudate infarct.Transthoraxic echo showed normal ejection fraction and carotid dopplers showed no significant stenosis.MRA brain was suboptimal with motion artefacts.Lipid profile was normal.She was started on clopidogrel but is having trouble affording it. She has done well without any recurrent symptoms and feels left sided strength is back to normal except mild diminished fine motor skills.She had extensive stroke evaluation in 2008 at time of her right frontal infarct including TEE  showing small PFO but TCD Bubble study was negative. Update 02/15/2014 ; She returns for followup today after  the last visit 6 months ago.. She continues to do well from the stroke standpoint without any recurrent or new stroke symptoms. She continues to have mild dragging of the left leg and heaviness as well as some diminished fine motor skills in the left hand but is unchanged. She continues to take Plavix and is tolerating it well without bruising or other side effects. She states her blood pressure is well controlled and today it is 130/8 sitter in the  office. She does have regular appointments with her primary physician. She has not had lipid profile checked in nearly a year now. Update 07/16/15 : She returns for follow-up today after recent hospital admission for recurrent stroke.She presented with sudden onset left sided weakness and right gaze deviation   (LKW 06/11/2015 at 1245). She was given tpa at 1359 after discussion with her boyfriend and herself. After CTA, angiogram to possibly pursue IA therapy was discussed, but family/patient declined this therapy.CTA head & neck intracranial atherosclerotic narrowing, occluded M1 at MCA. R sided top normal size lymph nodes. Transthoracic echo showed normal ejection fraction and no cardiac source of embolism. Post tPA CT scan of the head showed no significant hemorrhage. Lower extremity venous Dopplers were negative. LDL cholesterol was elevated at 86 mg percent. Hemoglobin A1c was borderline at 6.6. Patient had been taking aspirin and Plavix given prior to admission for her previous stroke. She was found to have   a small PFO on TEE done during her prior stroke in 2008 but transcranial Doppler bubble study had been negative at that time. Patient was advised to stop aspirin and stay on Plavix alone. She was transferred to inpatient rehabilitation and states now she is doing well since she has been discharged she is currently at home getting home physical and occupational therapy 2-3 times a week. She is able to walk with a walker with an AFO. She states her balance is poor but she is fortunately had no major falls. She is tolerating Plavix well without bleeding and only minor bruising. She states her blood pressure is well controlled and today it is 120/78. She is tolerating Pravachol well without myalgias  or arthralgias. She is living with her sister requires only mild help and supervision. Update 01/16/2016 : She returns for follow-up after last visit 6 months ago. Patient states she'll doing well without  recurrent stroke or TIA symptoms. Starting Plavix well without bleeding and occasional minor bruising only. Her blood pressure is well controlled and today it is 114/79. She states she had lipid profile checked by family physician and it was satisfactory. She is tolerating Pravachol without myalgias or arthralgias. Continues to walk with a cane and has to is slow and careful she has had no falls. ROS:   14 system review of systems is positive for hearing loss, wheezing, restless leg ,gait difficulty, leg heaviness and weakness only all other systems negative. PMH:  Past Medical History  Diagnosis Date  . ASTHMA 12/10/2009  . CEREBROVASCULAR ACCIDENT, HX OF 12/10/2009  . HYPERLIPIDEMIA 12/10/2009  . HYPERTENSION 12/10/2009  . Complication of anesthesia     "just can't get me up" (06/09/2013)  . Stroke [facility](HCC) 2008    denies residual on 06/09/2013  . Tendonitis of wrist, right   . Embolic stroke of right basal ganglia (HCC) 06/11/2015    with L residual weakness/hemiparesis     Social History:  Social History   Social History  . Marital Status: Single    Spouse Name: N/A  . Number of Children: 0  . Years of Education: 12th   Occupational History  . [employer]    Social History Main Topics  . Smoking status: Never Smoker   . Smokeless tobacco: Never Used  . Alcohol Use: No  . Drug Use: No  . Sexual Activity: Yes   Other Topics Concern  . Not on file   Social History Narrative    Medications:   Current Outpatient Prescriptions on File Prior to Visit  Medication Sig Dispense Refill  . acetaminophen (TYLENOL) 325 MG tablet Take 2 tablets (650 mg total) by mouth every 6 (six) hours as needed. 20 tablet 0  . albuterol (PROAIR HFA) 108 (90 BASE) MCG/ACT inhaler Inhale 2 puffs into the lungs every 6 (six) hours as needed for wheezing. 9 g 5  . amLODipine (NORVASC) 10 MG tablet Take 1 tablet (10 mg total) by mouth daily. 30 tablet 5  . clopidogrel (PLAVIX) 75 MG tablet Take 1 tablet (75 mg  total) by mouth daily. 30 tablet 5  . hydrALAZINE (APRESOLINE) 10 MG tablet Take 1 tablet (10 mg total) by mouth 3 (three) times daily. 90 tablet 5  . losartan (COZAAR) 100 MG tablet Take 1 tablet (100 mg total) by mouth daily. 30 tablet 5  . Multiple Vitamin (MULTIVITAMIN WITH MINERALS) TABS tablet Take 1 tablet by mouth daily.    . pantoprazole (PROTONIX) 40 MG tablet TAKE 1 TABLET BY MOUTH DAILY. (Patient taking differently: TAKE 1 TABLET BY MOUTH prn) 30 tablet 2  . pravastatin (PRAVACHOL) 20 MG tablet Take 1 tablet (20 mg total) by mouth daily. 30 tablet 5  . triamcinolone ointment (KENALOG) 0.5 % Apply 1 application topically 2 (two) times daily. (Patient taking differently: Apply 1 application topically 2 (two) times daily as needed. ) 30 g 0   No current facility-administered medications on file prior to visit.    Allergies:   Allergies  Allergen Reactions  . Penicillins Rash    Physical Exam General: Frail young African-American lady, seated, in no evident distress Head: head normocephalic and atraumatic.   Neck: supple with no carotid or supraclavicular bruits Cardiovascular: regular rate and  rhythm, no murmurs Musculoskeletal: no deformity Skin:  no rash/petichiae Vascular:  Normal pulses all extremities Filed Vitals:   01/16/16 1340  BP: 114/79  Pulse: 82    Neurologic Exam Mental Status: Awake and fully alert. Oriented to place and time. Recent and remote memory intact. Attention span, concentration and fund of knowledge appropriate. Mood and affect appropriate.  Cranial Nerves: Fundoscopic exam not done. Pupils equal, briskly reactive to light. Extraocular movements full without nystagmus. Visual fields full to confrontation. Hearing intact. Moderate left lower facial weakness. Facial sensation intact. Face, tongue, palate moves normally and symmetrically.  Motor: Normal bulk and tone. Spastic left hemiparesis grade 3/5 strength on the left with significant weakness of  left grip, intrinsic hand muscles, left ankle dorsiflexor weakness with foot drop. Sensory.: Diminished sensation to touch and pinprick and vibratory sensation on the left hemibody.  Coordination: Rapid alternating movements normal in all extremities on the right but impaired on the left. Finger-to-nose and heel-to-shin performed accurately on the right only Gait and Station: Deferred as patient is in a wheelchair and did not bring her walker  Reflexes: 2+ and asymmetric and brisker on the left. Toes downgoing.      ASSESSMENT: 36 year lady with right brain subcortical infarct in October 2014 as well as remote right frontal insular infarct of cryptogenic etiology. Questionable small left caudate lacunar infarct. Recent right basal ganglia infarct in October 2016 due to progressive occlusion of the right middle cerebral artery treated with IV TPA with residual spastic left hemiparesis Vascular risk factors of Hypertension and Hyperlipidimia.Questionable PFO on TEE in 2008 but not seen on TCD Bubble study. She has incidental enlarged lymph nodes in the neck. Recommend follow-up with primary care physician for the same.    PLAN: I had a long d/w patient about her recent stroke, risk for recurrent stroke/TIAs, personally independently reviewed imaging studies and stroke evaluation results and answered questions.Continue clopidogrel 75 mg daily  for secondary stroke prevention and maintain strict control of hypertension with blood pressure goal below 130/90, diabetes with hemoglobin A1c goal below 6.5% and lipids with LDL cholesterol goal below 70 mg/dL. I also advised the patient to eat a healthy diet with plenty of whole grains, cereals, fruits and vegetables, exercise regularly and maintain ideal body weight .she was counseled to use a cane and/or a time when walking outdoors. Greater than 50% time during this 25 minute visit was spent on counseling and coordination of care about her stroke risk and  spasticity Followup in the future with  Stroke nurse practitioner in one year or call earlier if necessary               Antony Contras, MD

## 2016-01-21 MED FILL — hydrALAZINE HCL 10 MG TABS: 10 | 30 days supply | Qty: 90 | Fill #1

## 2016-01-21 MED FILL — AMLODIPINE BESYLATE 10 MG T: 10 | 30 days supply | Qty: 30 | Fill #1

## 2016-01-29 MED FILL — PRAVASTATIN NA 20 MG TAB: 20 | 30 days supply | Qty: 30 | Fill #1

## 2016-01-29 MED FILL — LOSARTAN POTASSIUM 100 MG T: 100 | 30 days supply | Qty: 30 | Fill #0

## 2016-01-29 MED FILL — CLOPIDOGREL 75 MG TABLET: 75 | 30 days supply | Qty: 30 | Fill #2

## 2016-02-18 MED FILL — hydrALAZINE HCL 10 MG TABS: 10 | 30 days supply | Qty: 90 | Fill #2

## 2016-02-18 MED FILL — AMLODIPINE BESYLATE 10 MG T: 10 | 30 days supply | Qty: 30 | Fill #2

## 2016-02-22 ENCOUNTER — Encounter: Payer: Medicaid Other | Attending: Physical Medicine & Rehabilitation

## 2016-02-22 ENCOUNTER — Ambulatory Visit (HOSPITAL_BASED_OUTPATIENT_CLINIC_OR_DEPARTMENT_OTHER): Payer: Medicaid Other | Admitting: Physical Medicine & Rehabilitation

## 2016-02-22 ENCOUNTER — Encounter: Payer: Self-pay | Admitting: Physical Medicine & Rehabilitation

## 2016-02-22 VITALS — BP 129/84 | HR 90 | Resp 14

## 2016-02-22 DIAGNOSIS — I69954 Hemiplegia and hemiparesis following unspecified cerebrovascular disease affecting left non-dominant side: Secondary | ICD-10-CM | POA: Insufficient documentation

## 2016-02-22 DIAGNOSIS — S43002A Unspecified subluxation of left shoulder joint, initial encounter: Secondary | ICD-10-CM | POA: Insufficient documentation

## 2016-02-22 DIAGNOSIS — G8110 Spastic hemiplegia affecting unspecified side: Secondary | ICD-10-CM

## 2016-02-22 DIAGNOSIS — M7542 Impingement syndrome of left shoulder: Secondary | ICD-10-CM | POA: Diagnosis not present

## 2016-02-22 DIAGNOSIS — Z8673 Personal history of transient ischemic attack (TIA), and cerebral infarction without residual deficits: Secondary | ICD-10-CM | POA: Diagnosis present

## 2016-02-22 DIAGNOSIS — M25512 Pain in left shoulder: Secondary | ICD-10-CM | POA: Diagnosis not present

## 2016-02-22 DIAGNOSIS — M7502 Adhesive capsulitis of left shoulder: Secondary | ICD-10-CM | POA: Insufficient documentation

## 2016-02-22 DIAGNOSIS — R269 Unspecified abnormalities of gait and mobility: Secondary | ICD-10-CM | POA: Diagnosis present

## 2016-02-22 DIAGNOSIS — G8194 Hemiplegia, unspecified affecting left nondominant side: Secondary | ICD-10-CM | POA: Diagnosis present

## 2016-02-22 NOTE — Progress Notes (Signed)
   Subjective:    Patient ID: Rebecca Yoder, female    DOB: 30-Mar-1966, 50 y.o.   MRN: [id number]  HPI    Review of Systems     Objective:   Physical Exam        Assessment & Plan:  Botox Injection for spasticity using needle EMG guidance  Dilution: 50 Units/ml Indication: Severe spasticity which interferes with ADL,mobility and/or  hygiene and is unresponsive to medication management and other conservative care Informed consent was obtained after describing risks and benefits of the procedure with the patient. This includes bleeding, bruising, infection, excessive weakness, or medication side effects. A REMS form is on file and signed. Needle: 25g 2" needle electrode Number of units per muscle Posterior tibialis 75 units Gastroc 25 units 2 Flexor digitorum longus 75 units All injections were done after obtaining appropriate EMG activity and after negative drawback for blood. The patient tolerated the procedure well. Post procedure instructions were given. A followup appointment was made.

## 2016-02-22 NOTE — Patient Instructions (Signed)

## 2016-02-29 MED FILL — CLOPIDOGREL 75 MG TABLET: 75 | 30 days supply | Qty: 30 | Fill #3

## 2016-02-29 MED FILL — LOSARTAN POTASSIUM 100 MG T: 100 | 30 days supply | Qty: 30 | Fill #1

## 2016-02-29 MED FILL — PRAVASTATIN NA 20 MG TAB: 20 | 30 days supply | Qty: 30 | Fill #2

## 2016-03-17 MED FILL — hydrALAZINE HCL 10 MG TABS: 10 | 30 days supply | Qty: 90 | Fill #3

## 2016-03-17 MED FILL — AMLODIPINE BESYLATE 10 MG T: 10 | 30 days supply | Qty: 30 | Fill #3

## 2016-03-18 ENCOUNTER — Encounter: Payer: Self-pay | Admitting: Family Medicine

## 2016-03-18 ENCOUNTER — Ambulatory Visit: Payer: Medicaid Other | Attending: Family Medicine | Admitting: Family Medicine

## 2016-03-18 VITALS — BP 123/78 | HR 82 | Temp 98.2°F | Wt 173.0 lb

## 2016-03-18 DIAGNOSIS — M7022 Olecranon bursitis, left elbow: Secondary | ICD-10-CM | POA: Insufficient documentation

## 2016-03-18 DIAGNOSIS — G8194 Hemiplegia, unspecified affecting left nondominant side: Secondary | ICD-10-CM

## 2016-03-18 DIAGNOSIS — M7021 Olecranon bursitis, right elbow: Secondary | ICD-10-CM | POA: Insufficient documentation

## 2016-03-18 DIAGNOSIS — G459 Transient cerebral ischemic attack, unspecified: Secondary | ICD-10-CM | POA: Diagnosis not present

## 2016-03-18 DIAGNOSIS — E119 Type 2 diabetes mellitus without complications: Secondary | ICD-10-CM | POA: Diagnosis not present

## 2016-03-18 DIAGNOSIS — K219 Gastro-esophageal reflux disease without esophagitis: Secondary | ICD-10-CM | POA: Insufficient documentation

## 2016-03-18 DIAGNOSIS — I63511 Cerebral infarction due to unspecified occlusion or stenosis of right middle cerebral artery: Secondary | ICD-10-CM | POA: Diagnosis not present

## 2016-03-18 DIAGNOSIS — I10 Essential (primary) hypertension: Secondary | ICD-10-CM | POA: Insufficient documentation

## 2016-03-18 LAB — GLUCOSE, POCT (MANUAL RESULT ENTRY): POC GLUCOSE: 82 mg/dL (ref 70–99)

## 2016-03-18 LAB — POCT GLYCOSYLATED HEMOGLOBIN (HGB A1C): HEMOGLOBIN A1C: 5.9

## 2016-03-18 MED ORDER — CLOPIDOGREL BISULFATE 75 MG PO TABS: 75.0000 mg | ORAL_TABLET | Freq: Every day | ORAL | 5 refills | Status: DC

## 2016-03-18 MED ORDER — PRAVASTATIN SODIUM 20 MG PO TABS: 20.0000 mg | ORAL_TABLET | Freq: Every day | ORAL | 5 refills | Status: DC

## 2016-03-18 MED ORDER — HYDRALAZINE HCL 10 MG PO TABS: 10.0000 mg | ORAL_TABLET | Freq: Three times a day (TID) | ORAL | 5 refills | Status: DC

## 2016-03-18 MED ORDER — AMLODIPINE BESYLATE 10 MG PO TABS: 10.0000 mg | ORAL_TABLET | Freq: Every day | ORAL | 5 refills | Status: DC

## 2016-03-18 MED ORDER — LOSARTAN POTASSIUM 100 MG PO TABS: 100.0000 mg | ORAL_TABLET | Freq: Every day | ORAL | 5 refills | Status: DC

## 2016-03-18 MED ORDER — PANTOPRAZOLE SODIUM 40 MG PO TBEC: DELAYED_RELEASE_TABLET | ORAL | 2 refills | Status: DC

## 2016-03-18 NOTE — Progress Notes (Signed)
Subjective:  Patient ID: [name], female    DOB: April 09, 1966  Age: 50 y.o. MRN: [id number]  CC: Diabetes and Hypertension   HPI VONNIE MCWHINNEY has hx of CVA with L sided weakness, DM2, HTN she presents for   1. HTN: compliant with regimen. No HA, CP, SOB. Some swelling in L foot attributed to foot drop brace.   2. DM2: diet controlled. No vision changes, polyuria or polydipsia.   3. Fall: x 3 in past 3 months. 2 at home. Latest fall from toilet at [employer]. She bumped her L elbow and it has been swollen since. There is no pain or redness. The previous falls were at home. One from a chair. The other while turning without her walker. She denies associated CP, HA, dizziness or lightheadedness. Her L sided weakness has improved since her stroke but persist. She plans to starting an exercise program.   Social History  Substance Use Topics  . Smoking status: Never Smoker  . Smokeless tobacco: Never Used  . Alcohol use No    Outpatient Medications Prior to Visit  Medication Sig Dispense Refill  . acetaminophen (TYLENOL) 325 MG tablet Take 2 tablets (650 mg total) by mouth every 6 (six) hours as needed. 20 tablet 0  . albuterol (PROAIR HFA) 108 (90 BASE) MCG/ACT inhaler Inhale 2 puffs into the lungs every 6 (six) hours as needed for wheezing. 9 g 5  . amLODipine (NORVASC) 10 MG tablet Take 1 tablet (10 mg total) by mouth daily. 30 tablet 5  . clopidogrel (PLAVIX) 75 MG tablet Take 1 tablet (75 mg total) by mouth daily. 30 tablet 5  . hydrALAZINE (APRESOLINE) 10 MG tablet Take 1 tablet (10 mg total) by mouth 3 (three) times daily. 90 tablet 5  . losartan (COZAAR) 100 MG tablet Take 1 tablet (100 mg total) by mouth daily. 30 tablet 5  . Multiple Vitamin (MULTIVITAMIN WITH MINERALS) TABS tablet Take 1 tablet by mouth daily.    . pravastatin (PRAVACHOL) 20 MG tablet Take 1 tablet (20 mg total) by mouth daily. 30 tablet 5  . triamcinolone ointment (KENALOG) 0.5 % Apply 1 application  topically 2 (two) times daily. (Patient taking differently: Apply 1 application topically 2 (two) times daily as needed. ) 30 g 0  . pantoprazole (PROTONIX) 40 MG tablet TAKE 1 TABLET BY MOUTH DAILY. (Patient taking differently: TAKE 1 TABLET BY MOUTH prn) 30 tablet 2   No facility-administered medications prior to visit.     ROS Review of Systems  Constitutional: Negative for chills and fever.  Eyes: Negative for visual disturbance.  Respiratory: Negative for shortness of breath.   Cardiovascular: Negative for chest pain.  Gastrointestinal: Negative for abdominal pain and blood in stool.  Endocrine: Negative for polydipsia, polyphagia and polyuria.  Genitourinary: Negative for frequency.  Musculoskeletal: Negative for arthralgias and back pain.  Skin: Negative for rash.  Allergic/Immunologic: Negative for immunocompromised state.  Neurological: Positive for weakness (L sided ). Negative for headaches.  Hematological: Negative for adenopathy. Does not bruise/bleed easily.  Psychiatric/Behavioral: Negative for dysphoric mood and suicidal ideas.    Objective:  BP 123/78 (BP Location: Right Arm, Patient Position: Sitting, Cuff Size: Small)   Pulse 82   Temp 98.2 F (36.8 C) (Oral)   Wt 173 lb (78.5 kg)   LMP 02/22/2016   SpO2 99%   BMI 28.79 kg/m   BP/Weight 03/18/2016 [id number]  Systolic BP [id number]  Diastolic BP 78 84 79  Wt. (Lbs) 173 - 170.8  BMI 28.79 - 28.42    Physical Exam  Constitutional: She is oriented to person, place, and time. She appears well-developed and well-nourished. No distress.  HENT:  Head: Normocephalic and atraumatic.  Cardiovascular: Normal rate, regular rhythm, normal heart sounds and intact distal pulses.   Pulmonary/Chest: Effort normal and breath sounds normal.  Musculoskeletal: She exhibits no edema.       Left shoulder: She exhibits decreased range of motion and decreased strength. She exhibits no tenderness, no bony tenderness,  no swelling, no effusion, no crepitus, no deformity, no laceration and normal pulse.       Left hip: She exhibits decreased strength.       Arms: Neurological: She is alert and oriented to person, place, and time.  Skin: Skin is warm and dry. No rash noted.  Psychiatric: She has a normal mood and affect.    Lab Results  Component Value Date   HGBA1C 5.8 10/18/2015   Lab Results  Component Value Date   HGBA1C 5.9 03/18/2016    CBG 82  Assessment & Plan:   Dyandra was seen today for diabetes and hypertension.  Diagnoses and all orders for this visit:  Controlled type 2 diabetes mellitus without complication, without long-term current use of insulin (HCC) -     Glucose (CBG) -     HgB A1c -     Ambulatory referral to Ophthalmology  Olecranon bursitis of right elbow -     Ambulatory referral to Sports Medicine  Essential hypertension -     losartan (COZAAR) 100 MG tablet; Take 1 tablet (100 mg total) by mouth daily. -     amLODipine (NORVASC) 10 MG tablet; Take 1 tablet (10 mg total) by mouth daily. -     hydrALAZINE (APRESOLINE) 10 MG tablet; Take 1 tablet (10 mg total) by mouth 3 (three) times daily.  Right middle cerebral artery stroke (HCC) -     pravastatin (PRAVACHOL) 20 MG tablet; Take 1 tablet (20 mg total) by mouth daily. -     clopidogrel (PLAVIX) 75 MG tablet; Take 1 tablet (75 mg total) by mouth daily.  Gastroesophageal reflux disease, esophagitis presence not specified -     pantoprazole (PROTONIX) 40 MG tablet; TAKE 1 TABLET BY MOUTH prn   No orders of the defined types were placed in this encounter.   Follow-up: Return in about 3 months (around 06/18/2016) for DM2, HTN .   Boykin Nearing MD

## 2016-03-18 NOTE — Patient Instructions (Addendum)
Rebecca Yoder was seen today for diabetes and hypertension.  Diagnoses and all orders for this visit:  Controlled type 2 diabetes mellitus without complication, without long-term current use of insulin (HCC) -     Glucose (CBG) -     HgB A1c -     Ambulatory referral to Ophthalmology  Olecranon bursitis of right elbow -     Ambulatory referral to Sports Medicine  Essential hypertension -     losartan (COZAAR) 100 MG tablet; Take 1 tablet (100 mg total) by mouth daily. -     amLODipine (NORVASC) 10 MG tablet; Take 1 tablet (10 mg total) by mouth daily. -     hydrALAZINE (APRESOLINE) 10 MG tablet; Take 1 tablet (10 mg total) by mouth 3 (three) times daily.  Right middle cerebral artery stroke (HCC) -     pravastatin (PRAVACHOL) 20 MG tablet; Take 1 tablet (20 mg total) by mouth daily. -     clopidogrel (PLAVIX) 75 MG tablet; Take 1 tablet (75 mg total) by mouth daily.  Gastroesophageal reflux disease, esophagitis presence not specified -     pantoprazole (PROTONIX) 40 MG tablet; TAKE 1 TABLET BY MOUTH prn    F/u in 2 months for flu shot with flu clinic  F/u OV in 3-4 months   Dr. Adrian Blackwater

## 2016-03-18 NOTE — Assessment & Plan Note (Signed)
Traumatic non-septic and non-inflammatory bursitis  Sports medicine referral for possible aspiration and bracing to prevent recurrence

## 2016-03-18 NOTE — Progress Notes (Signed)
C/C follow up on HTN and Diabetes.patient fell down July 13 and left elbow has a knot on it.

## 2016-03-18 NOTE — Assessment & Plan Note (Signed)
Persist following stroke with slight improvement Continue PT and OT exercise Encouraged use of walker Encouraged regular exercise

## 2016-03-18 NOTE — Assessment & Plan Note (Signed)
Controlled- continue current regimen.  

## 2016-03-18 NOTE — Assessment & Plan Note (Signed)
Diet controlled.  

## 2016-03-28 ENCOUNTER — Encounter: Payer: Self-pay | Admitting: Family Medicine

## 2016-03-28 ENCOUNTER — Ambulatory Visit (INDEPENDENT_AMBULATORY_CARE_PROVIDER_SITE_OTHER): Payer: Medicaid Other | Admitting: Family Medicine

## 2016-03-28 DIAGNOSIS — M7022 Olecranon bursitis, left elbow: Secondary | ICD-10-CM | POA: Diagnosis not present

## 2016-03-28 NOTE — Assessment & Plan Note (Signed)
We explained etiology and likely normal course. She is relieved that it's nothing significant that's going to further impair her left upper extremity use. After discussion, she decided try compression for a month. If it resolves she does not have to follow-up. She still has issues and I suspect it will still be swollen as it fairly large, she can return and we'll consider aspiration and/or injection.

## 2016-03-28 NOTE — Progress Notes (Signed)
  IFRA URIVE - 50 y.o. female MRN [id number]  Date of birth: [birth year]    SUBJECTIVE:     Chief Complaint: Left elbow swelling  HPI: She has had one or 2 falls in the last 6 weeks, landing on her elbow, resulting in some elbow swelling that has not resolved. She's not really having any elbow pain. He does not bother her flexion or extension, is just worrisome to her aggravating when she looks at it. She has weakness on the left side after her recent stroke which was her fourth stroke. She is using a walker. She is concerned that the elbow swelling will somehow and up in further deformity and weakness of the left upper extremity. ROS:     No unusual weight change, fever, sweats, chills.  PERTINENT  PMH / PSH FH / / SH:  Past Medical, Surgical, Social, and Family History Reviewed & Updated in the EMR.  Pertinent findings include:  Multiple CVA with sequela Diabetes mellitus Left-sided spastic hemiplegia  OBJECTIVE: BP 112/72   Pulse 80   Wt 173 lb (78.5 kg)   LMP 02/22/2016   BMI 28.79 kg/m   Physical Exam:  Vital signs are reviewed. GEN.: Well-developed female using a walker and ambulating with some difficulty but without any other assistance EXTREMITY: Left extremity is somewhat atrophied compared with the right but she has full range of motion in flexion extension the elbow. She does not have full strength on the left side compared with the right, the left being 3-4 out of 5 right, 5 out of 5. ELBOW: Left. Nontender to palpation. She has very large mass over the olecranon that is soft and fluctuant, not warm, no increased erythema.  ULTRASOUND: The elbow joint does not reveal any abnormality other than very large olecranon bursa full of fluid.  ASSESSMENT & PLAN:  See problem based charting & AVS for pt instructions.

## 2016-03-28 NOTE — Patient Instructions (Signed)
You have a swollen elbow bursa--that is a cushiony sac over the pointy part of the elbow. It is not worrisome. It may resolve with time, it may not. We will try adding daily compression (not TOO tight) to the elbow with an ace bandage. You can wear it all day and all night if you want but re-wrap it at least once every 24 hours. If it bothers you, you can take it off during sleep hours.  We will try compression for about 4 weeks and see how much improvement you have--if it does not resolve, we can discuss possible aspiration and or injection at next office visit.   This condition is benign---it does not lead to any type of joint issues or other long term problems, so if you wanted to you could just ignore it.  We will see you back in month or so. Nice to meet you!

## 2016-04-01 MED FILL — LOSARTAN POTASSIUM 100 MG T: 100 | 30 days supply | Qty: 30 | Fill #2

## 2016-04-01 MED FILL — CLOPIDOGREL 75 MG TABLET: 75 | 30 days supply | Qty: 30 | Fill #0

## 2016-04-01 MED FILL — PRAVASTATIN NA 20 MG TAB: 20 | 30 days supply | Qty: 30 | Fill #3

## 2016-04-04 ENCOUNTER — Encounter: Payer: Medicaid Other | Attending: Physical Medicine & Rehabilitation

## 2016-04-04 ENCOUNTER — Encounter: Payer: Self-pay | Admitting: Physical Medicine & Rehabilitation

## 2016-04-04 ENCOUNTER — Ambulatory Visit (HOSPITAL_BASED_OUTPATIENT_CLINIC_OR_DEPARTMENT_OTHER): Payer: Medicaid Other | Admitting: Physical Medicine & Rehabilitation

## 2016-04-04 VITALS — BP 136/86 | HR 86

## 2016-04-04 DIAGNOSIS — M7542 Impingement syndrome of left shoulder: Secondary | ICD-10-CM | POA: Diagnosis not present

## 2016-04-04 DIAGNOSIS — I69954 Hemiplegia and hemiparesis following unspecified cerebrovascular disease affecting left non-dominant side: Secondary | ICD-10-CM | POA: Diagnosis not present

## 2016-04-04 DIAGNOSIS — M25512 Pain in left shoulder: Secondary | ICD-10-CM | POA: Insufficient documentation

## 2016-04-04 DIAGNOSIS — R269 Unspecified abnormalities of gait and mobility: Secondary | ICD-10-CM

## 2016-04-04 DIAGNOSIS — G8110 Spastic hemiplegia affecting unspecified side: Secondary | ICD-10-CM

## 2016-04-04 DIAGNOSIS — Z8673 Personal history of transient ischemic attack (TIA), and cerebral infarction without residual deficits: Secondary | ICD-10-CM | POA: Diagnosis present

## 2016-04-04 DIAGNOSIS — S43002A Unspecified subluxation of left shoulder joint, initial encounter: Secondary | ICD-10-CM | POA: Diagnosis not present

## 2016-04-04 DIAGNOSIS — M7502 Adhesive capsulitis of left shoulder: Secondary | ICD-10-CM | POA: Diagnosis not present

## 2016-04-04 DIAGNOSIS — I69398 Other sequelae of cerebral infarction: Secondary | ICD-10-CM

## 2016-04-04 DIAGNOSIS — G8194 Hemiplegia, unspecified affecting left nondominant side: Secondary | ICD-10-CM | POA: Diagnosis present

## 2016-04-04 NOTE — Progress Notes (Signed)
Subjective:    Patient ID: Rebecca Yoder, female    DOB: [birth year], 50 y.o.   MRN: [id number]  HPI   Botox LLE performed 02/22/16 Posterior tibialis 75 units Gastroc 25 units 2 Flexor digitorum longus 75 units Improved walking, walks a few ft without walker Vacuums from a WC  Indep with Dressing and bathing Unable to cook a lot but can bake, fry an egg  Doing some resistance training at [employer] Average Pain 0 Pain Right Now 0 My pain is no pain  In the last 24 hours, has pain interfered with the following? General activity 4 Relation with others 9 Enjoyment of life 9 What TIME of day is your pain at its worst? night Sleep (in general) Poor  Pain is worse with: unsure Pain improves with: therapy/exercise Relief from Meds: 10  Mobility use a walker how many minutes can you walk? 40 ability to climb steps?  yes do you drive?  no Do you have any goals in this area?  yes  Function disabled: date disabled 05-24-15 Do you have any goals in this area?  yes  Neuro/Psych trouble walking  Prior Studies na  Physicians involved in your care na   History reviewed. No pertinent family history. Social History   Social History  . Marital status: Single    Spouse name: N/A  . Number of children: 0  . Years of education: 12th   Occupational History  . [employer]   Social History Main Topics  . Smoking status: Never Smoker  . Smokeless tobacco: Never Used  . Alcohol use No  . Drug use: No  . Sexual activity: Yes   Other Topics Concern  . None   Social History Narrative  . None   Past Surgical History:  Procedure Laterality Date  . COSMETIC SURGERY    . OVARIAN CYST REMOVAL  1990's   Past Medical History:  Diagnosis Date  . ASTHMA 12/10/2009  . CEREBROVASCULAR ACCIDENT, HX OF 12/10/2009  . Complication of anesthesia    "just can't get me up" (06/09/2013)  . Embolic stroke of right basal ganglia ([place]) 06/11/2015   with L  residual weakness/hemiparesis   . HYPERLIPIDEMIA 12/10/2009  . HYPERTENSION 12/10/2009  . Stroke [facility]) 2008   denies residual on 06/09/2013  . Tendonitis of wrist, right    BP 136/86   Pulse 86   LMP 02/22/2016   SpO2 95%   Opioid Risk Score:   Fall Risk Score:  `1  Depression screen PHQ 2/9  Depression screen [facility] 2/9 03/28/2016 03/18/2016 12/11/2015 08/14/2015 08/10/2015 07/31/2015 07/11/2015  Decreased Interest 0 1 3 3  0 0 0  Down, Depressed, Hopeless 0 1 0 0 0 0 0  PHQ - 2 Score 0 2 3 3  0 0 0  Altered sleeping - 2 1 1  - - -  Tired, decreased energy - 0 0 2 - - -  Change in appetite - 0 0 1 - - -  Feeling bad or failure about yourself  - 0 0 0 - - -  Trouble concentrating - 0 0 0 - - -  Moving slowly or fidgety/restless - 0 2 0 - - -  Suicidal thoughts - 0 0 0 - - -  PHQ-9 Score - 4 6 7  - - -  Difficult doing work/chores - - - Not difficult at all - - -     Review of Systems  Constitutional: Negative.   HENT: Negative.  Eyes: Negative.   Respiratory: Negative.   Cardiovascular: Negative.   Gastrointestinal: Negative.   Endocrine: Negative.   Genitourinary: Negative.   Musculoskeletal: Positive for gait problem.  Skin: Negative.   Allergic/Immunologic: Negative.   Hematological: Negative.   Psychiatric/Behavioral: Negative.        Objective:   Physical Exam  Constitutional: She is oriented to person, place, and time. She appears well-developed and well-nourished.  HENT:  Head: Normocephalic and atraumatic.  Eyes: Conjunctivae and EOM are normal. Pupils are equal, round, and reactive to light.  Neurological: She is alert and oriented to person, place, and time.  Psychiatric: She has a normal mood and affect.  Nursing note and vitals reviewed.   Patient has some toe curling of the small toes of the left foot as well as the hallux. Is no evidence of foot inversion is able to keep her foot plantigrade on the ground with her shoe off.      Assessment & Plan:  1.   Left spastic hemiparesis due to R MCA infarct 06/11/15, has completed inpatient rehabilitation as well as outpatient rehabilitation Next injection. Recommend Posterior tibialis 75 units Gastroc 25 unitsx2 Soleus 25 units times 1 Flexor hallucis longus 75 units Flexor digitorum longus 75 units

## 2016-04-17 MED FILL — hydrALAZINE HCL 10 MG TABS: 10 | 30 days supply | Qty: 90 | Fill #4

## 2016-04-17 MED FILL — AMLODIPINE BESYLATE 10 MG T: 10 | 30 days supply | Qty: 30 | Fill #4

## 2016-04-25 ENCOUNTER — Ambulatory Visit (INDEPENDENT_AMBULATORY_CARE_PROVIDER_SITE_OTHER): Payer: Medicaid Other | Admitting: Sports Medicine

## 2016-04-25 ENCOUNTER — Encounter: Payer: Self-pay | Admitting: Sports Medicine

## 2016-04-25 VITALS — BP 132/74 | HR 110 | Ht 65.0 in | Wt 173.0 lb

## 2016-04-25 DIAGNOSIS — M7022 Olecranon bursitis, left elbow: Secondary | ICD-10-CM | POA: Diagnosis not present

## 2016-04-25 NOTE — Progress Notes (Signed)
   Subjective:    Patient ID: Rebecca Yoder, female    DOB: [birth year], 50 y.o.   MRN: [id number]  HPI   Patient comes in today for follow-up on left elbow olecranon bursitis. She saw Dr. Nori Riis on August 18. An ultrasound evaluation at that time showed a fairly enlarged olecranon bursa. Aspiration was recommended but the patient refused. Instead, a compression sleeve was applied and she was instructed to follow-up today. In the interim, she has had complete resolution of her swelling. She has discontinued wearing her compression sleeve. She states that her elbow looks like it has returned to its normal appearance.    Review of Systems     Objective:   Physical Exam Well-developed, well-nourished. No acute distress. Sitting comfortably in the exam room  Left elbow: Full range of motion. No effusion. There is prominence of the olecranon and palpation reveals some probable calcification of the olecranon bursa but the swelling noted on her previous exam has completely resolved. She is not tender to palpation. She is neurovascularly intact distally.       Assessment & Plan:   Resolved left elbow olecranon bursitis  I recommend no further treatment at this point in time. I did educate the patient about the importance of avoiding excessive pressure over the olecranon. I've also instructed her to resume wearing her compression sleeve if she notices any return swelling. Otherwise, resume activity as tolerated without restriction and follow-up as needed.

## 2016-04-28 MED FILL — ?LOSARTAN POTASSIUM 100 MG: 100 | 30 days supply | Qty: 30 | Fill #3

## 2016-04-28 MED FILL — PRAVASTATIN NA 20 MG TAB: 20 | 30 days supply | Qty: 30 | Fill #4

## 2016-04-28 MED FILL — PROAIR HFA 90 MCG INHALER: 108 (90 BAS | 25 days supply | Qty: 9 | Fill #2

## 2016-04-28 MED FILL — CLOPIDOGREL 75 MG TABLET: 75 | 30 days supply | Qty: 30 | Fill #1

## 2016-05-19 ENCOUNTER — Other Ambulatory Visit: Payer: Self-pay | Admitting: Family Medicine

## 2016-05-19 DIAGNOSIS — Z1231 Encounter for screening mammogram for malignant neoplasm of breast: Secondary | ICD-10-CM

## 2016-05-19 MED FILL — hydrALAZINE HCL 10 MG TABS: 10 | 30 days supply | Qty: 90 | Fill #5

## 2016-05-19 MED FILL — AMLODIPINE BESYLATE 10 MG T: 10 | 30 days supply | Qty: 30 | Fill #5

## 2016-05-26 MED FILL — CLOPIDOGREL 75 MG TABLET: 75 | 30 days supply | Qty: 30 | Fill #2

## 2016-05-26 MED FILL — PRAVASTATIN NA 20 MG TAB: 20 | 30 days supply | Qty: 30 | Fill #5

## 2016-05-26 MED FILL — LOSARTAN POTASSIUM 100 MG T: 100 | 30 days supply | Qty: 30 | Fill #4

## 2016-05-27 ENCOUNTER — Encounter: Payer: Self-pay | Admitting: Physical Medicine & Rehabilitation

## 2016-05-27 ENCOUNTER — Ambulatory Visit (HOSPITAL_BASED_OUTPATIENT_CLINIC_OR_DEPARTMENT_OTHER): Payer: Medicaid Other | Admitting: Physical Medicine & Rehabilitation

## 2016-05-27 ENCOUNTER — Encounter: Payer: Medicaid Other | Attending: Physical Medicine & Rehabilitation

## 2016-05-27 VITALS — BP 123/87 | HR 94 | Resp 14

## 2016-05-27 DIAGNOSIS — I69398 Other sequelae of cerebral infarction: Secondary | ICD-10-CM

## 2016-05-27 DIAGNOSIS — G8194 Hemiplegia, unspecified affecting left nondominant side: Secondary | ICD-10-CM | POA: Diagnosis present

## 2016-05-27 DIAGNOSIS — M25512 Pain in left shoulder: Secondary | ICD-10-CM | POA: Insufficient documentation

## 2016-05-27 DIAGNOSIS — M7502 Adhesive capsulitis of left shoulder: Secondary | ICD-10-CM | POA: Diagnosis not present

## 2016-05-27 DIAGNOSIS — M7542 Impingement syndrome of left shoulder: Secondary | ICD-10-CM | POA: Diagnosis not present

## 2016-05-27 DIAGNOSIS — S43002A Unspecified subluxation of left shoulder joint, initial encounter: Secondary | ICD-10-CM | POA: Diagnosis not present

## 2016-05-27 DIAGNOSIS — G8110 Spastic hemiplegia affecting unspecified side: Secondary | ICD-10-CM | POA: Diagnosis not present

## 2016-05-27 DIAGNOSIS — R269 Unspecified abnormalities of gait and mobility: Secondary | ICD-10-CM | POA: Diagnosis present

## 2016-05-27 DIAGNOSIS — I69954 Hemiplegia and hemiparesis following unspecified cerebrovascular disease affecting left non-dominant side: Secondary | ICD-10-CM | POA: Diagnosis not present

## 2016-05-27 DIAGNOSIS — Z8673 Personal history of transient ischemic attack (TIA), and cerebral infarction without residual deficits: Secondary | ICD-10-CM | POA: Diagnosis present

## 2016-05-27 NOTE — Patient Instructions (Signed)

## 2016-05-27 NOTE — Progress Notes (Signed)
Botox Injection for spasticity using needle EMG guidance  Dilution: 50 Units/ml Indication: Severe spasticity which interferes with ADL,mobility and/or  hygiene and is unresponsive to medication management and other conservative care Informed consent was obtained after describing risks and benefits of the procedure with the patient. This includes bleeding, bruising, infection, excessive weakness, or medication side effects. A REMS form is on file and signed. Needle: 49mm 25g needle electrode Number of units per muscle Posterior tibialis 75 units Gastroc 25 unitsx2 Soleus 25 units times 1 Flexor hallucis longus 75 units Flexor digitorum longus 75 units All injections were done after obtaining appropriate EMG activity and after negative drawback for blood. The patient tolerated the procedure well. Post procedure instructions were given. A followup appointment was made.

## 2016-05-29 ENCOUNTER — Ambulatory Visit
Admission: RE | Admit: 2016-05-29 | Discharge: 2016-05-29 | Disposition: A | Discharge status: Home / Self Care | Payer: Medicaid Other | Source: Ambulatory Visit | Attending: Family Medicine | Admitting: Family Medicine

## 2016-05-29 DIAGNOSIS — Z1231 Encounter for screening mammogram for malignant neoplasm of breast: Secondary | ICD-10-CM

## 2016-06-18 ENCOUNTER — Other Ambulatory Visit: Payer: Self-pay | Admitting: Family Medicine

## 2016-06-18 DIAGNOSIS — I10 Essential (primary) hypertension: Secondary | ICD-10-CM

## 2016-06-18 MED FILL — hydrALAZINE HCL 10 MG TABS: 10 | 30 days supply | Qty: 90 | Fill #0

## 2016-06-18 MED FILL — AMLODIPINE BESYLATE 10 MG T: 10 | 30 days supply | Qty: 30 | Fill #0

## 2016-06-23 ENCOUNTER — Encounter: Payer: Self-pay | Admitting: Family Medicine

## 2016-06-23 ENCOUNTER — Ambulatory Visit: Payer: Medicaid Other | Attending: Family Medicine | Admitting: Family Medicine

## 2016-06-23 VITALS — BP 125/81 | HR 94 | Temp 98.5°F | Ht 64.0 in | Wt 176.2 lb

## 2016-06-23 DIAGNOSIS — I10 Essential (primary) hypertension: Secondary | ICD-10-CM

## 2016-06-23 DIAGNOSIS — E119 Type 2 diabetes mellitus without complications: Secondary | ICD-10-CM

## 2016-06-23 DIAGNOSIS — I63511 Cerebral infarction due to unspecified occlusion or stenosis of right middle cerebral artery: Secondary | ICD-10-CM

## 2016-06-23 DIAGNOSIS — Z23 Encounter for immunization: Secondary | ICD-10-CM

## 2016-06-23 LAB — GLUCOSE, POCT (MANUAL RESULT ENTRY): POC Glucose: 91 mg/dl (ref 70–99)

## 2016-06-23 MED ORDER — PRAVASTATIN SODIUM 20 MG PO TABS: 20.0000 mg | ORAL_TABLET | Freq: Every day | ORAL | 11 refills | Status: DC

## 2016-06-23 MED ORDER — LOSARTAN POTASSIUM 100 MG PO TABS: 100.0000 mg | ORAL_TABLET | Freq: Every day | ORAL | 11 refills | Status: DC

## 2016-06-23 MED ORDER — AMLODIPINE BESYLATE 10 MG PO TABS: 10.0000 mg | ORAL_TABLET | Freq: Every day | ORAL | 11 refills | Status: DC

## 2016-06-23 MED ORDER — CLOPIDOGREL BISULFATE 75 MG PO TABS: 75.0000 mg | ORAL_TABLET | Freq: Every day | ORAL | 11 refills | Status: DC

## 2016-06-23 MED ORDER — HYDRALAZINE HCL 10 MG PO TABS: 10.0000 mg | ORAL_TABLET | Freq: Three times a day (TID) | ORAL | 11 refills | Status: DC

## 2016-06-23 MED FILL — PRAVASTATIN NA 20 MG TAB: 20 | 30 days supply | Qty: 30 | Fill #0

## 2016-06-23 MED FILL — CLOPIDOGREL 75 MG TABLET: 75 | 30 days supply | Qty: 30 | Fill #0

## 2016-06-23 MED FILL — AMLODIPINE BESYLATE 10 MG T: 10 | 30 days supply | Qty: 30 | Fill #0

## 2016-06-23 MED FILL — LOSARTAN POTASSIUM 100 MG T: 100 | 30 days supply | Qty: 30 | Fill #0

## 2016-06-23 NOTE — Assessment & Plan Note (Signed)
Well controlled with normal CBG today A1c q 6 months

## 2016-06-23 NOTE — Progress Notes (Signed)
Subjective:  Patient ID: Rebecca Yoder, female    DOB: 04-27-1966  Age: 50 y.o. MRN: [id number]  CC: Diabetes   HPI Rebecca Yoder has hx of CVA with L sided weakness, DM2, HTN she presents for   1. HTN: compliant with regimen. No HA, CP, SOB. Some swelling in L foot attributed to foot drop brace.   2. DM2: diet controlled. No vision changes, polyuria or polydipsia. She had her vision checked 2 months ago. She was prescribed corrective lenses but otherwise had a normal exam.    Social History  Substance Use Topics  . Smoking status: Never Smoker  . Smokeless tobacco: Never Used  . Alcohol use No    Outpatient Medications Prior to Visit  Medication Sig Dispense Refill  . acetaminophen (TYLENOL) 325 MG tablet Take 2 tablets (650 mg total) by mouth every 6 (six) hours as needed. 20 tablet 0  . albuterol (PROAIR HFA) 108 (90 BASE) MCG/ACT inhaler Inhale 2 puffs into the lungs every 6 (six) hours as needed for wheezing. 9 g 5  . amLODipine (NORVASC) 10 MG tablet TAKE 1 TABLET BY MOUTH DAILY. 30 tablet 2  . clopidogrel (PLAVIX) 75 MG tablet Take 1 tablet (75 mg total) by mouth daily. 30 tablet 5  . hydrALAZINE (APRESOLINE) 10 MG tablet Take 1 tablet (10 mg total) by mouth 3 (three) times daily. 90 tablet 5  . losartan (COZAAR) 100 MG tablet Take 1 tablet (100 mg total) by mouth daily. 30 tablet 5  . Multiple Vitamin (MULTIVITAMIN WITH MINERALS) TABS tablet Take 1 tablet by mouth daily.    . pantoprazole (PROTONIX) 40 MG tablet TAKE 1 TABLET BY MOUTH prn 30 tablet 2  . pravastatin (PRAVACHOL) 20 MG tablet Take 1 tablet (20 mg total) by mouth daily. 30 tablet 5  . triamcinolone ointment (KENALOG) 0.5 % Apply 1 application topically 2 (two) times daily. (Patient taking differently: Apply 1 application topically 2 (two) times daily as needed. ) 30 g 0   No facility-administered medications prior to visit.     ROS Review of Systems  Constitutional: Negative for chills and fever.    Eyes: Negative for visual disturbance.  Respiratory: Negative for shortness of breath.   Cardiovascular: Negative for chest pain.  Gastrointestinal: Negative for abdominal pain and blood in stool.  Endocrine: Negative for polydipsia, polyphagia and polyuria.  Genitourinary: Negative for frequency.  Musculoskeletal: Negative for arthralgias and back pain.  Skin: Negative for rash.  Allergic/Immunologic: Negative for immunocompromised state.  Neurological: Positive for weakness (L sided ). Negative for headaches.  Hematological: Negative for adenopathy. Does not bruise/bleed easily.  Psychiatric/Behavioral: Negative for dysphoric mood and suicidal ideas.    Objective:  BP 125/81 (BP Location: Right Arm, Patient Position: Sitting, Cuff Size: Small)   Pulse 94   Temp 98.5 F (36.9 C) (Oral)   Ht 5\' 4"  (1.626 m)   Wt 176 lb 3.2 oz (79.9 kg)   LMP 06/09/2016   SpO2 98%   BMI 30.24 kg/m   BP/Weight 06/23/2016 05/27/2016 [id number]  Systolic BP 0000000 [id number]  Diastolic BP 81 87 74  Wt. (Lbs) 176.2 - 173  BMI 30.24 - 28.79    Physical Exam  Constitutional: She is oriented to person, place, and time. She appears well-developed and well-nourished. No distress.  HENT:  Head: Normocephalic and atraumatic.  Cardiovascular: Normal rate, regular rhythm, normal heart sounds and intact distal pulses.   Pulmonary/Chest: Effort normal and breath sounds normal.  Musculoskeletal:  She exhibits no edema.       Left shoulder: She exhibits decreased range of motion and decreased strength. She exhibits no tenderness, no bony tenderness, no swelling, no effusion, no crepitus, no deformity, no laceration and normal pulse.       Left hip: She exhibits decreased strength.  Neurological: She is alert and oriented to person, place, and time.  Skin: Skin is warm and dry. No rash noted.  Psychiatric: She has a normal mood and affect.    Lab Results  Component Value Date   HGBA1C 5.9 03/18/2016   CBG  82  Assessment & Plan:   Rebecca Yoder was seen today for diabetes and hypertension.  Diagnoses and all orders for this visit:  Controlled type 2 diabetes mellitus without complication, without long-term current use of insulin (HCC) -     POCT glucose (manual entry)  Right middle cerebral artery stroke (HCC) -     pravastatin (PRAVACHOL) 20 MG tablet; Take 1 tablet (20 mg total) by mouth daily. -     clopidogrel (PLAVIX) 75 MG tablet; Take 1 tablet (75 mg total) by mouth daily.  Essential hypertension -     losartan (COZAAR) 100 MG tablet; Take 1 tablet (100 mg total) by mouth daily. -     hydrALAZINE (APRESOLINE) 10 MG tablet; Take 1 tablet (10 mg total) by mouth 3 (three) times daily. -     amLODipine (NORVASC) 10 MG tablet; Take 1 tablet (10 mg total) by mouth daily.   No orders of the defined types were placed in this encounter.   Follow-up: Return in about 3 months (around 09/23/2016) for HTN and DM2.   Boykin Nearing MD

## 2016-06-23 NOTE — Assessment & Plan Note (Signed)
Well-controlled.  Continue current regimen. 

## 2016-06-23 NOTE — Patient Instructions (Addendum)
Rebecca Yoder was seen today for diabetes and hypertension.  Diagnoses and all orders for this visit:  Controlled type 2 diabetes mellitus without complication, without long-term current use of insulin (HCC) -     POCT glucose (manual entry)  Right middle cerebral artery stroke (HCC) -     pravastatin (PRAVACHOL) 20 MG tablet; Take 1 tablet (20 mg total) by mouth daily. -     clopidogrel (PLAVIX) 75 MG tablet; Take 1 tablet (75 mg total) by mouth daily.  Essential hypertension -     losartan (COZAAR) 100 MG tablet; Take 1 tablet (100 mg total) by mouth daily. -     hydrALAZINE (APRESOLINE) 10 MG tablet; Take 1 tablet (10 mg total) by mouth 3 (three) times daily. -     amLODipine (NORVASC) 10 MG tablet; Take 1 tablet (10 mg total) by mouth daily.   F/u in 3 months for DM2 and HTN  Dr. Adrian Blackwater

## 2016-06-30 ENCOUNTER — Other Ambulatory Visit: Payer: Self-pay | Admitting: Family Medicine

## 2016-06-30 DIAGNOSIS — I63511 Cerebral infarction due to unspecified occlusion or stenosis of right middle cerebral artery: Secondary | ICD-10-CM

## 2016-07-01 ENCOUNTER — Telehealth: Payer: Self-pay | Admitting: Family Medicine

## 2016-07-01 NOTE — Telephone Encounter (Signed)
Called and left pt a message informing her that she has a prescription ready for pick up. Informed pt that she has to pick them up by Monday 11/27 before 3pm.

## 2016-07-15 ENCOUNTER — Encounter: Payer: Self-pay | Admitting: Physical Medicine & Rehabilitation

## 2016-07-15 ENCOUNTER — Encounter: Payer: Self-pay | Attending: Physical Medicine & Rehabilitation

## 2016-07-15 ENCOUNTER — Ambulatory Visit (HOSPITAL_BASED_OUTPATIENT_CLINIC_OR_DEPARTMENT_OTHER): Payer: Self-pay | Admitting: Physical Medicine & Rehabilitation

## 2016-07-15 VITALS — BP 127/84 | HR 82 | Resp 16

## 2016-07-15 DIAGNOSIS — G8110 Spastic hemiplegia affecting unspecified side: Secondary | ICD-10-CM

## 2016-07-15 DIAGNOSIS — M25512 Pain in left shoulder: Secondary | ICD-10-CM | POA: Insufficient documentation

## 2016-07-15 DIAGNOSIS — M7502 Adhesive capsulitis of left shoulder: Secondary | ICD-10-CM | POA: Insufficient documentation

## 2016-07-15 DIAGNOSIS — I69954 Hemiplegia and hemiparesis following unspecified cerebrovascular disease affecting left non-dominant side: Secondary | ICD-10-CM | POA: Insufficient documentation

## 2016-07-15 DIAGNOSIS — M7542 Impingement syndrome of left shoulder: Secondary | ICD-10-CM | POA: Insufficient documentation

## 2016-07-15 DIAGNOSIS — S43002A Unspecified subluxation of left shoulder joint, initial encounter: Secondary | ICD-10-CM | POA: Insufficient documentation

## 2016-07-15 DIAGNOSIS — R269 Unspecified abnormalities of gait and mobility: Secondary | ICD-10-CM | POA: Insufficient documentation

## 2016-07-15 NOTE — Progress Notes (Signed)
Subjective:    Patient ID: Rebecca Yoder, female    DOB: 1966-01-28, 50 y.o.   MRN: [id number] 06/06/2016 Posterior tibialis 75 units Gastroc 25 unitsx2 Soleus 25 units times 1 Flexor hallucis longus 75 units Flexor digitorum longus 75 units HPI LLE botox injection, 6 wks ago  "did very well"  Walking better , can I use cane instead of walker? Patient has had no falls. She is modified independent with all her self-care and mobility. She is no longer able to work. However, given the significant limitations in left upper and left lower limb. Pain Inventory Average Pain 1 Pain Right Now 0 My pain is sharp  In the last 24 hours, has pain interfered with the following? General activity 9 Relation with others 10 Enjoyment of life 8 What TIME of day is your pain at its worst? night Sleep (in general) Fair  Pain is worse with: other Pain improves with: therapy/exercise Relief from Meds: 9  Mobility use a cane use a walker ability to climb steps?  yes do you drive?  no  Function disabled: date disabled .  Neuro/Psych weakness  Prior Studies Any changes since last visit?  no  Physicians involved in your care Any changes since last visit?  no   No family history on file. Social History   Social History  . Marital status: Single    Spouse name: N/A  . Number of children: 0  . Years of education: 12th   Occupational History  . [employer]   Social History Main Topics  . Smoking status: Never Smoker  . Smokeless tobacco: Never Used  . Alcohol use No  . Drug use: No  . Sexual activity: Yes   Other Topics Concern  . None   Social History Narrative  . None   Past Surgical History:  Procedure Laterality Date  . COSMETIC SURGERY    . OVARIAN CYST REMOVAL  1990's   Past Medical History:  Diagnosis Date  . ASTHMA 12/10/2009  . CEREBROVASCULAR ACCIDENT, HX OF 12/10/2009  . Complication of anesthesia    "just can't get me up" (06/09/2013)  .  Embolic stroke of right basal ganglia ([place]) 06/11/2015   with L residual weakness/hemiparesis   . HYPERLIPIDEMIA 12/10/2009  . HYPERTENSION 12/10/2009  . Stroke [facility]) 2008   denies residual on 06/09/2013  . Tendonitis of wrist, right    BP 127/84   Pulse 82   Resp 16   LMP 06/09/2016   SpO2 96%   Opioid Risk Score:   Fall Risk Score:  `1  Depression screen PHQ 2/9  Depression screen [facility] 2/9 07/15/2016 06/23/2016 04/25/2016 03/28/2016 03/18/2016 12/11/2015 08/14/2015  Decreased Interest 0 2 0 0 1 3 3   Down, Depressed, Hopeless 0 0 0 0 1 0 0  PHQ - 2 Score 0 2 0 0 2 3 3   Altered sleeping - 2 - - 2 1 1   Tired, decreased energy - 2 - - 0 0 2  Change in appetite - 0 - - 0 0 1  Feeling bad or failure about yourself  - 0 - - 0 0 0  Trouble concentrating - 0 - - 0 0 0  Moving slowly or fidgety/restless - 0 - - 0 2 0  Suicidal thoughts - 3 - - 0 0 0  PHQ-9 Score - 9 - - 4 6 7   Difficult doing work/chores - - - - - - Not difficult at all    Review  of Systems  Constitutional: Negative.   HENT: Negative.   Eyes: Negative.   Respiratory: Positive for wheezing.   Cardiovascular: Negative.   Gastrointestinal: Negative.   Endocrine:       Low blood sugar  Genitourinary: Negative.   Musculoskeletal: Negative.   Skin: Negative.   Allergic/Immunologic: Negative.   Neurological: Negative.   Hematological: Negative.   Psychiatric/Behavioral: Negative.   All other systems reviewed and are negative.      Objective:   Physical Exam  Constitutional: She is oriented to person, place, and time. She appears well-developed and well-nourished.  HENT:  Head: Normocephalic and atraumatic.  Eyes: Conjunctivae are normal. Pupils are equal, round, and reactive to light.  Neurological: She is alert and oriented to person, place, and time.  Skin: Skin is warm and dry.  Psychiatric: She has a normal mood and affect.  Nursing note and vitals reviewed.  no evidence clonus left ankle. Good passive range of  motion at the left ankle active range of motion is limited to trace plantar flexion. With standing. There is some toe curling affecting the digits 1 through 5. [place] 1 at the plantar flexors. [place], 0 at the foot inverters and [place] 1 at the toe flexors   Ambulates without evidence toe drag or knee instability when she has her brace on. Min hand-held assist     Assessment & Plan:  1. Improvement in spasticity left lower extremity after Botox injection into the toe flexors as well as tibialis posterior, left lower extremity.  We'll repeat in 6 weeks  Imbalances improved. Postinjection, may now ambulate with cane inside the home for a week or 2 and then start using it outside the home.

## 2016-07-15 NOTE — Patient Instructions (Addendum)
Look into [place] card [facility]  You may start using a cane, just use it inside the home into you feel more comfortable with your walking, then you can start using the cane outside the house

## 2016-07-22 MED FILL — PRAVASTATIN NA 20 MG TAB: 20 | 30 days supply | Qty: 30 | Fill #1

## 2016-07-22 MED FILL — LOSARTAN POTASSIUM 100 MG T: 100 | 30 days supply | Qty: 30 | Fill #1

## 2016-07-22 MED FILL — hydrALAZINE HCL 10 MG TABS: 10 | 30 days supply | Qty: 90 | Fill #1

## 2016-07-28 MED FILL — CLOPIDOGREL 75 MG TABLET: 75 | 30 days supply | Qty: 30 | Fill #1

## 2016-08-08 ENCOUNTER — Telehealth: Payer: Self-pay | Admitting: Physical Medicine & Rehabilitation

## 2016-08-08 NOTE — Telephone Encounter (Signed)
Recd ppwrk ([place]) for disability CU[employer] to [facility] inbox for completion

## 2016-08-19 MED FILL — hydrALAZINE HCL 10 MG TABS: 10 | 30 days supply | Qty: 90 | Fill #2

## 2016-08-19 MED FILL — PRAVASTATIN NA 20 MG TAB: 20 | 30 days supply | Qty: 30 | Fill #2

## 2016-08-19 MED FILL — ?AMLODIPINE BESYLATE 10 MG: 10 | 30 days supply | Qty: 30 | Fill #1

## 2016-08-25 ENCOUNTER — Telehealth: Payer: Self-pay | Admitting: Physical Medicine & Rehabilitation

## 2016-08-25 MED FILL — CLOPIDOGREL 75 MG TABLET: 75 | 30 days supply | Qty: 30 | Fill #2

## 2016-08-25 MED FILL — LOSARTAN POTASSIUM 100 MG T: 100 | 30 days supply | Qty: 30 | Fill #2

## 2016-08-25 NOTE — Telephone Encounter (Signed)
Recd phone call from [name] [phone] inquiring about estate estimate of disability or when next office visit it... Do not see the forms that were returned from [employer]'s in box - do you have them?

## 2016-08-28 ENCOUNTER — Ambulatory Visit: Payer: Medicaid Other | Admitting: Physical Medicine & Rehabilitation

## 2016-09-15 ENCOUNTER — Ambulatory Visit: Payer: BLUE CROSS/BLUE SHIELD | Attending: Family Medicine | Admitting: Family Medicine

## 2016-09-15 ENCOUNTER — Encounter: Payer: Self-pay | Admitting: Family Medicine

## 2016-09-15 VITALS — BP 155/94 | HR 100 | Temp 97.3°F | Ht 64.0 in | Wt 176.0 lb

## 2016-09-15 DIAGNOSIS — E7800 Pure hypercholesterolemia, unspecified: Secondary | ICD-10-CM

## 2016-09-15 DIAGNOSIS — I69354 Hemiplegia and hemiparesis following cerebral infarction affecting left non-dominant side: Secondary | ICD-10-CM | POA: Diagnosis not present

## 2016-09-15 DIAGNOSIS — N946 Dysmenorrhea, unspecified: Secondary | ICD-10-CM

## 2016-09-15 DIAGNOSIS — E119 Type 2 diabetes mellitus without complications: Secondary | ICD-10-CM | POA: Diagnosis not present

## 2016-09-15 DIAGNOSIS — I10 Essential (primary) hypertension: Secondary | ICD-10-CM

## 2016-09-15 DIAGNOSIS — I69954 Hemiplegia and hemiparesis following unspecified cerebrovascular disease affecting left non-dominant side: Secondary | ICD-10-CM | POA: Insufficient documentation

## 2016-09-15 LAB — COMPLETE METABOLIC PANEL WITH GFR
ALBUMIN: 4.4 g/dL (ref 3.6–5.1)
ALK PHOS: 55 U/L (ref 33–115)
ALT: 16 U/L (ref 6–29)
AST: 14 U/L (ref 10–35)
BILIRUBIN TOTAL: 0.3 mg/dL (ref 0.2–1.2)
BUN: 11 mg/dL (ref 7–25)
CALCIUM: 9.9 mg/dL (ref 8.6–10.2)
CO2: 25 mmol/L (ref 20–31)
Chloride: 107 mmol/L (ref 98–110)
Creat: 0.65 mg/dL (ref 0.50–1.10)
GFR, Est African American: >89 mL/min (ref >=60)
Glucose, Bld: 80 mg/dL (ref 65–99)
Potassium: 4.1 mmol/L (ref 3.5–5.3)
Sodium: 142 mmol/L (ref 135–146)
Total Protein: 7.5 g/dL (ref 6.1–8.1)

## 2016-09-15 LAB — LIPID PANEL
CHOLESTEROL: 126 mg/dL (ref <200)
HDL: 53 mg/dL (ref >50)
LDL Cholesterol: 51 mg/dL (ref <100)
TRIGLYCERIDES: 110 mg/dL (ref <150)
Total CHOL/HDL Ratio: 2.4 Ratio (ref <5.0)
VLDL: 22 mg/dL (ref <30)

## 2016-09-15 LAB — CBC
HCT: 33.8 % — ABNORMAL LOW (ref 35.0–45.0)
HEMOGLOBIN: 11.4 g/dL — AB (ref 11.7–15.5)
MCH: 29.1 pg (ref 27.0–33.0)
MCHC: 33.7 g/dL (ref 32.0–36.0)
MCV: 86.2 fL (ref 80.0–100.0)
MPV: 8.7 fL (ref 7.5–12.5)
PLATELETS: 451 10*3/uL — AB (ref 140–400)
RBC: 3.92 MIL/uL (ref 3.80–5.10)
RDW: 14.8 % (ref 11.0–15.0)
WBC: 4.4 10*3/uL (ref 3.8–10.8)

## 2016-09-15 LAB — POCT GLYCOSYLATED HEMOGLOBIN (HGB A1C): Hemoglobin A1C: 5.8

## 2016-09-15 LAB — GLUCOSE, POCT (MANUAL RESULT ENTRY): POC Glucose: 94 mg/dl (ref 70–99)

## 2016-09-15 MED ORDER — IBUPROFEN 600 MG PO TABS: 600.0000 mg | ORAL_TABLET | Freq: Three times a day (TID) | ORAL | 0 refills | Status: DC | PRN

## 2016-09-15 MED ORDER — CHLORTHALIDONE 25 MG PO TABS: 25.0000 mg | ORAL_TABLET | Freq: Every day | ORAL | 5 refills | Status: DC

## 2016-09-15 MED FILL — PRAVASTATIN NA 20 MG TAB: 20 | 30 days supply | Qty: 30 | Fill #3

## 2016-09-15 MED FILL — CHLORTHALIDONE 25 MG TABLET: 25 | 30 days supply | Qty: 30 | Fill #0

## 2016-09-15 MED FILL — IBUPROFEN 600 MG TABLET: 600 | 10 days supply | Qty: 30 | Fill #0

## 2016-09-15 MED FILL — AMLODIPINE BESYLATE 10 MG T: 10 | 30 days supply | Qty: 30 | Fill #2

## 2016-09-15 NOTE — Assessment & Plan Note (Signed)
Elevated BP Compliant with regimen Plan: Dc hydralazine 10 mg TID Start chlorthalidone 25 mg daily Continue losartan 100 mg daily and amlodipine 10 mg daily

## 2016-09-15 NOTE — Progress Notes (Signed)
Subjective:  Patient ID: Rebecca Yoder, female    DOB: 01/02/1966  Age: 51 y.o. MRN: [id number]  CC: Hypertension and Diabetes   HPI Rebecca Yoder has hx of CVA with L sided weakness, DM2, HTN she presents for   1. HTN: compliant with regimen. No HA, CP, SOB. Reports her mother passed away last month from an 8 year battle with multiple myeloma. She was transitioned to a hospice facility prior to passing away.   2. DM2: diet controlled. No vision changes, polyuria or polydipsia. She had her vision checked 2 months ago. She was prescribed corrective lenses but otherwise had a normal exam.   3. Painful menses: LMP started 09/09/2016. Started having painful cramps two days later. No nausea or emesis. Pain was in lower back and abdomen. She took 400 mg of ibuprofen this AM with some improvement in symptoms.    Social History  Substance Use Topics  . Smoking status: Never Smoker  . Smokeless tobacco: Never Used  . Alcohol use No    Outpatient Medications Prior to Visit  Medication Sig Dispense Refill  . acetaminophen (TYLENOL) 325 MG tablet Take 2 tablets (650 mg total) by mouth every 6 (six) hours as needed. 20 tablet 0  . albuterol (PROAIR HFA) 108 (90 BASE) MCG/ACT inhaler Inhale 2 puffs into the lungs every 6 (six) hours as needed for wheezing. 9 g 5  . amLODipine (NORVASC) 10 MG tablet Take 1 tablet (10 mg total) by mouth daily. 30 tablet 11  . hydrALAZINE (APRESOLINE) 10 MG tablet Take 1 tablet (10 mg total) by mouth 3 (three) times daily. 90 tablet 11  . losartan (COZAAR) 100 MG tablet Take 1 tablet (100 mg total) by mouth daily. 30 tablet 11  . Multiple Vitamin (MULTIVITAMIN WITH MINERALS) TABS tablet Take 1 tablet by mouth daily.    . pravastatin (PRAVACHOL) 20 MG tablet Take 1 tablet (20 mg total) by mouth daily. 30 tablet 11  . triamcinolone ointment (KENALOG) 0.5 % Apply 1 application topically 2 (two) times daily. (Patient taking differently: Apply 1 application  topically 2 (two) times daily as needed. ) 30 g 0  . clopidogrel (PLAVIX) 75 MG tablet Take 1 tablet (75 mg total) by mouth daily. 30 tablet 11  . pantoprazole (PROTONIX) 40 MG tablet TAKE 1 TABLET BY MOUTH prn (Patient not taking: Reported on 09/15/2016) 30 tablet 2   No facility-administered medications prior to visit.     ROS Review of Systems  Constitutional: Negative for chills and fever.  Eyes: Negative for visual disturbance.  Respiratory: Negative for shortness of breath.   Cardiovascular: Negative for chest pain.  Gastrointestinal: Negative for abdominal pain and blood in stool.  Endocrine: Negative for polydipsia, polyphagia and polyuria.  Genitourinary: Negative for frequency.  Musculoskeletal: Negative for arthralgias and back pain.  Skin: Negative for rash.  Allergic/Immunologic: Negative for immunocompromised state.  Neurological: Positive for weakness (L sided ). Negative for headaches.  Hematological: Negative for adenopathy. Does not bruise/bleed easily.  Psychiatric/Behavioral: Negative for dysphoric mood and suicidal ideas.    Objective:  BP (!) 155/94 (BP Location: Right Arm, Patient Position: Sitting, Cuff Size: Small)   Pulse 100   Temp 97.3 F (36.3 C) (Oral)   Ht '5\' 4"'  (1.626 m)   Wt 176 lb (79.8 kg)   LMP 09/09/2016 (Exact Date)   SpO2 99%   BMI 30.21 kg/m   BP/Weight 09/15/2016 07/15/2016 42/76/7011  Systolic BP 003 496 116  Diastolic BP 94  84 81  Wt. (Lbs) 176 - 176.2  BMI 30.21 - 30.24    Physical Exam  Constitutional: She is oriented to person, place, and time. She appears well-developed and well-nourished. No distress.  HENT:  Head: Normocephalic and atraumatic.  Cardiovascular: Normal rate, regular rhythm, normal heart sounds and intact distal pulses.   Pulmonary/Chest: Effort normal and breath sounds normal.  Musculoskeletal: She exhibits no edema.       Left shoulder: She exhibits decreased range of motion and decreased strength. She  exhibits no tenderness, no bony tenderness, no swelling, no effusion, no crepitus, no deformity, no laceration and normal pulse.       Left hip: She exhibits decreased strength.  Neurological: She is alert and oriented to person, place, and time.  Skin: Skin is warm and dry. No rash noted.  Psychiatric: She has a normal mood and affect.    Lab Results  Component Value Date   HGBA1C 5.8 09/15/2016   CBG 94 Assessment & Plan:   Rebecca Yoder was seen today for hypertension and diabetes.  Diagnoses and all orders for this visit:  Controlled type 2 diabetes mellitus without complication, without long-term current use of insulin (HCC) -     POCT glucose (manual entry) -     POCT glycosylated hemoglobin (Hb A1C) -     Lipid Panel  Essential hypertension -     COMPLETE METABOLIC PANEL WITH GFR -     CBC -     chlorthalidone (HYGROTON) 25 MG tablet; Take 1 tablet (25 mg total) by mouth daily. -     Lipid Panel  Painful menstrual periods -     ibuprofen (ADVIL,MOTRIN) 600 MG tablet; Take 1 tablet (600 mg total) by mouth every 8 (eight) hours as needed. For painful menses  Pure hypercholesterolemia -     Lipid Panel   No orders of the defined types were placed in this encounter.   Follow-up: Return in about 3 weeks (around 10/06/2016) for HTN .   Boykin Nearing MD

## 2016-09-15 NOTE — Assessment & Plan Note (Signed)
Well controlled Foot exam done today Continue current regimen

## 2016-09-15 NOTE — Patient Instructions (Addendum)
Rebecca Yoder was seen today for hypertension and diabetes.  Diagnoses and all orders for this visit:  Controlled type 2 diabetes mellitus without complication, without long-term current use of insulin (HCC) -     POCT glucose (manual entry) -     POCT glycosylated hemoglobin (Hb A1C) -     Lipid Panel  Essential hypertension -     COMPLETE METABOLIC PANEL WITH GFR -     CBC -     chlorthalidone (HYGROTON) 25 MG tablet; Take 1 tablet (25 mg total) by mouth daily. -     Lipid Panel  Painful menstrual periods -     ibuprofen (ADVIL,MOTRIN) 600 MG tablet; Take 1 tablet (600 mg total) by mouth every 8 (eight) hours as needed. For painful menses  take with food   Stop hydralazine Replace with chlorthalidone 25 mg daily   F/u in 3 weeks for HTN, BP check with clinical pharmacologist  Dr. Adrian Blackwater

## 2016-09-17 ENCOUNTER — Telehealth: Payer: Self-pay

## 2016-09-17 NOTE — Telephone Encounter (Signed)
Patient called and requested an excuse from jury duty letter.

## 2016-09-18 ENCOUNTER — Encounter: Payer: Self-pay | Admitting: Physical Medicine & Rehabilitation

## 2016-09-18 NOTE — Telephone Encounter (Signed)
Letter on printer

## 2016-09-18 NOTE — Telephone Encounter (Signed)
Called patient and informed her that her letter is ready for pick up at front desk.

## 2016-09-19 ENCOUNTER — Encounter: Payer: Self-pay | Admitting: Physical Medicine & Rehabilitation

## 2016-09-19 ENCOUNTER — Encounter: Payer: BLUE CROSS/BLUE SHIELD | Attending: Physical Medicine & Rehabilitation

## 2016-09-19 ENCOUNTER — Ambulatory Visit (HOSPITAL_BASED_OUTPATIENT_CLINIC_OR_DEPARTMENT_OTHER): Payer: BLUE CROSS/BLUE SHIELD | Admitting: Physical Medicine & Rehabilitation

## 2016-09-19 VITALS — BP 138/91 | HR 105 | Resp 14

## 2016-09-19 DIAGNOSIS — M7502 Adhesive capsulitis of left shoulder: Secondary | ICD-10-CM | POA: Diagnosis not present

## 2016-09-19 DIAGNOSIS — R269 Unspecified abnormalities of gait and mobility: Secondary | ICD-10-CM | POA: Diagnosis present

## 2016-09-19 DIAGNOSIS — G8110 Spastic hemiplegia affecting unspecified side: Secondary | ICD-10-CM | POA: Diagnosis not present

## 2016-09-19 DIAGNOSIS — S43002A Unspecified subluxation of left shoulder joint, initial encounter: Secondary | ICD-10-CM | POA: Diagnosis not present

## 2016-09-19 DIAGNOSIS — I69954 Hemiplegia and hemiparesis following unspecified cerebrovascular disease affecting left non-dominant side: Secondary | ICD-10-CM | POA: Diagnosis not present

## 2016-09-19 DIAGNOSIS — M25512 Pain in left shoulder: Secondary | ICD-10-CM | POA: Diagnosis not present

## 2016-09-19 DIAGNOSIS — M7542 Impingement syndrome of left shoulder: Secondary | ICD-10-CM | POA: Insufficient documentation

## 2016-09-19 DIAGNOSIS — Z8673 Personal history of transient ischemic attack (TIA), and cerebral infarction without residual deficits: Secondary | ICD-10-CM | POA: Diagnosis present

## 2016-09-19 DIAGNOSIS — G8194 Hemiplegia, unspecified affecting left nondominant side: Secondary | ICD-10-CM | POA: Diagnosis present

## 2016-09-19 NOTE — Patient Instructions (Signed)

## 2016-09-19 NOTE — Progress Notes (Signed)
Botox Injection for spasticity using needle EMG guidance  Dilution: 50 Units/ml Indication: Severe spasticity which interferes with ADL,mobility and/or  hygiene and is unresponsive to medication management and other conservative care Informed consent was obtained after describing risks and benefits of the procedure with the patient. This includes bleeding, bruising, infection, excessive weakness, or medication side effects. A REMS form is on file and signed. Needle: 25g 80mm needle electrode Number of units per muscle Posterior tibialis 75 units Gastroc 25 unitsx2 Soleus 25 units times 1 Flexor hallucis longus 75 units Flexor digitorum longus 75 units All injections were done after obtaining appropriate EMG activity and after negative drawback for blood. The patient tolerated the procedure well. Post procedure instructions were given. A followup appointment was made.

## 2016-09-22 ENCOUNTER — Ambulatory Visit: Payer: Self-pay | Admitting: Physical Medicine & Rehabilitation

## 2016-09-22 MED FILL — CLOPIDOGREL 75 MG TABLET: 75 | 30 days supply | Qty: 30 | Fill #3

## 2016-09-22 MED FILL — LOSARTAN POTASSIUM 100 MG T: 100 | 30 days supply | Qty: 30 | Fill #3

## 2016-10-07 ENCOUNTER — Ambulatory Visit: Payer: BLUE CROSS/BLUE SHIELD | Admitting: Pharmacist

## 2016-10-10 ENCOUNTER — Other Ambulatory Visit: Payer: Self-pay | Admitting: Physician Assistant

## 2016-10-10 MED FILL — PROAIR HFA 90 MCG INHALER: 108 (90 BAS | 25 days supply | Qty: 9 | Fill #0

## 2016-10-10 MED FILL — CHLORTHALIDONE 25 MG TABLET: 25 | 30 days supply | Qty: 30 | Fill #1

## 2016-10-13 MED FILL — AMLODIPINE BESYLATE 10 MG T: 10 | 30 days supply | Qty: 30 | Fill #3

## 2016-10-13 MED FILL — PRAVASTATIN NA 20 MG TAB: 20 | 30 days supply | Qty: 30 | Fill #4

## 2016-10-14 ENCOUNTER — Encounter: Payer: Self-pay | Admitting: Pharmacist

## 2016-10-14 ENCOUNTER — Ambulatory Visit: Payer: BLUE CROSS/BLUE SHIELD | Attending: Family Medicine | Admitting: Pharmacist

## 2016-10-14 VITALS — BP 112/76 | HR 66 | Wt 162.0 lb

## 2016-10-14 DIAGNOSIS — I10 Essential (primary) hypertension: Secondary | ICD-10-CM | POA: Diagnosis not present

## 2016-10-14 NOTE — Progress Notes (Signed)
   S:    Patient arrives in good spirits ambulating with a cane.    Presents to the clinic for hypertension evaluation. Patient was referred on 09/15/16 by Dr. Adrian Blackwater.  Patient was last seen by Primary Care Provider on 09/15/16.   Patient reports adherence with medications. Her last dose of her blood pressure medications was this morning.  Current BP Medications include:  Amlodipine 10 mg daily, chlorthalidone 25 mg daily and losartan 100 mg daily.  Antihypertensives tried in the past include: Hydralazine  Patient reports trying to eat better to lose weight.    O:   Last 3 Office BP readings: BP Readings from Last 3 Encounters:  10/14/16 112/76  09/19/16 (!) 138/91  09/15/16 (!) 155/94    BMET    Component Value Date/Time   NA 142 09/15/2016 1048   K 4.1 09/15/2016 1048   CL 107 09/15/2016 1048   CO2 25 09/15/2016 1048   GLUCOSE 80 09/15/2016 1048   BUN 11 09/15/2016 1048   CREATININE 0.65 09/15/2016 1048   CALCIUM 9.9 09/15/2016 1048   GFRNONAA >89 09/15/2016 1048   GFRAA >89 09/15/2016 1048    A/P: Hypertension longstanding currently controlled on current medications.  Continued all medications as prescribed and congratulated patient on getting to her blood pressure goal. Patient with weight loss since last visit - likely a combination of loss of water weight 2/2 diuretic and well as intention to take in less calories in order to lose weight. Continue to monitor.  Results reviewed and written information provided.   Total time in face-to-face counseling 10 minutes.   F/U Clinic Visit with Dr. Adrian Blackwater in 2 months (due to 3 month follow up at that point).

## 2016-10-14 NOTE — Patient Instructions (Addendum)
Thanks for coming to see me!  Your blood pressure is great!!! Keep taking all of your medications as prescribed.  Follow up with Dr. Adrian Blackwater in 2 months

## 2016-10-27 MED FILL — LOSARTAN POTASSIUM 100 MG T: 100 | 30 days supply | Qty: 30 | Fill #4

## 2016-10-27 MED FILL — CLOPIDOGREL 75 MG TABLET: 75 | 30 days supply | Qty: 30 | Fill #4

## 2016-10-31 ENCOUNTER — Encounter: Payer: Self-pay | Admitting: Physical Medicine & Rehabilitation

## 2016-10-31 ENCOUNTER — Encounter: Payer: BLUE CROSS/BLUE SHIELD | Attending: Physical Medicine & Rehabilitation

## 2016-10-31 ENCOUNTER — Ambulatory Visit (HOSPITAL_BASED_OUTPATIENT_CLINIC_OR_DEPARTMENT_OTHER): Payer: BLUE CROSS/BLUE SHIELD | Admitting: Physical Medicine & Rehabilitation

## 2016-10-31 VITALS — BP 126/88 | HR 100 | Resp 14

## 2016-10-31 DIAGNOSIS — S43002A Unspecified subluxation of left shoulder joint, initial encounter: Secondary | ICD-10-CM | POA: Diagnosis not present

## 2016-10-31 DIAGNOSIS — R269 Unspecified abnormalities of gait and mobility: Secondary | ICD-10-CM

## 2016-10-31 DIAGNOSIS — I69398 Other sequelae of cerebral infarction: Secondary | ICD-10-CM

## 2016-10-31 DIAGNOSIS — M7542 Impingement syndrome of left shoulder: Secondary | ICD-10-CM | POA: Diagnosis not present

## 2016-10-31 DIAGNOSIS — Z8673 Personal history of transient ischemic attack (TIA), and cerebral infarction without residual deficits: Secondary | ICD-10-CM | POA: Diagnosis present

## 2016-10-31 DIAGNOSIS — G8110 Spastic hemiplegia affecting unspecified side: Secondary | ICD-10-CM

## 2016-10-31 DIAGNOSIS — I69954 Hemiplegia and hemiparesis following unspecified cerebrovascular disease affecting left non-dominant side: Secondary | ICD-10-CM | POA: Insufficient documentation

## 2016-10-31 DIAGNOSIS — M7502 Adhesive capsulitis of left shoulder: Secondary | ICD-10-CM | POA: Insufficient documentation

## 2016-10-31 DIAGNOSIS — M25512 Pain in left shoulder: Secondary | ICD-10-CM | POA: Diagnosis not present

## 2016-10-31 DIAGNOSIS — G8194 Hemiplegia, unspecified affecting left nondominant side: Secondary | ICD-10-CM | POA: Diagnosis present

## 2016-10-31 NOTE — Progress Notes (Signed)
Subjective:    Patient ID: Rebecca Yoder, female    DOB: 09-Jun-1966, 51 y.o.   MRN: [id number]  HPI 09/19/16  Posterior tibialis 75 units Gastroc 25 unitsx2 Soleus 25 units times 1 Flexor hallucis longus 75 units Flexor digitorum longus 75 units  Patient feels like her foot and ankle stay in better position during ambulation.  Going to stroke support group, speaking at a stroke benefit  No new pain issues. Functionally, she is dressing, bathing herself, ambulating with a cane. Does not drive Pain Inventory Average Pain 0 Pain Right Now 0 My pain is no pain  In the last 24 hours, has pain interfered with the following? General activity 0 Relation with others 0 Enjoyment of life 0 What TIME of day is your pain at its worst? no pain Sleep (in general) Poor  Pain is worse with: no pain Pain improves with: no pain Relief from Meds: no pain  Mobility walk with assistance use a cane use a walker how many minutes can you walk? 45 Do you have any goals in this area?  yes  Function disabled: date disabled . Do you have any goals in this area?  yes  Neuro/Psych bladder control problems trouble walking  Prior Studies Any changes since last visit?  no  Physicians involved in your care Any changes since last visit?  no   Family History  Problem Relation Age of Onset  . Multiple myeloma Mother    Social History   Social History  . Marital status: Single    Spouse name: N/A  . Number of children: 0  . Years of education: 12th   Occupational History  . [employer]   Social History Main Topics  . Smoking status: Never Smoker  . Smokeless tobacco: Never Used  . Alcohol use No  . Drug use: No  . Sexual activity: Yes   Other Topics Concern  . None   Social History Narrative  . None   Past Surgical History:  Procedure Laterality Date  . COSMETIC SURGERY    . OVARIAN CYST REMOVAL  1990's   Past Medical History:  Diagnosis Date  . ASTHMA  12/10/2009  . CEREBROVASCULAR ACCIDENT, HX OF 12/10/2009  . Complication of anesthesia    "just can't get me up" (06/09/2013)  . Embolic stroke of right basal ganglia ([place]) 06/11/2015   with L residual weakness/hemiparesis   . HYPERLIPIDEMIA 12/10/2009  . HYPERTENSION 12/10/2009  . Stroke [facility]) 2008   denies residual on 06/09/2013  . Tendonitis of wrist, right    BP 126/88   Pulse 100   Resp 14   SpO2 98%   Opioid Risk Score:   Fall Risk Score:  `1  Depression screen PHQ 2/9  Depression screen [facility] 2/9 09/15/2016 07/15/2016 06/23/2016 04/25/2016 03/28/2016 03/18/2016 12/11/2015  Decreased Interest 3 0 2 0 0 1 3  Down, Depressed, Hopeless 0 0 0 0 0 1 0  PHQ - 2 Score 3 0 2 0 0 2 3  Altered sleeping 2 - 2 - - 2 1  Tired, decreased energy 2 - 2 - - 0 0  Change in appetite 3 - 0 - - 0 0  Feeling bad or failure about yourself  0 - 0 - - 0 0  Trouble concentrating 0 - 0 - - 0 0  Moving slowly or fidgety/restless 2 - 0 - - 0 2  Suicidal thoughts 0 - 3 - - 0 0  PHQ-9 Score  12 - 9 - - 4 6  Difficult doing work/chores - - - - - - -    Review of Systems  Constitutional: Negative.   HENT: Negative.   Eyes: Negative.   Respiratory: Negative.   Cardiovascular: Negative.   Gastrointestinal: Negative.   Endocrine:       High blood sugar   Genitourinary: Positive for urgency.       Incontinence  Musculoskeletal: Positive for gait problem.  Skin: Negative.   Allergic/Immunologic: Negative.   Hematological: Negative.   Psychiatric/Behavioral: Negative.   All other systems reviewed and are negative.      Objective:   Physical Exam  Constitutional: She is oriented to person, place, and time. She appears well-developed and well-nourished.  HENT:  Head: Normocephalic and atraumatic.  Eyes: Conjunctivae and EOM are normal. Pupils are equal, round, and reactive to light.  Neck: Normal range of motion.  Neurological: She is alert and oriented to person, place, and time.  Motor strength is 3/5  in the left deltoid, bicep, triceps, finger flexors and extensors. 4 minus at the left hip flexor, knee extensor, and trace ankle dorsiflexor. Tone modified [place] 3 in the toe flexors 2, at the foot, plantar flexors and inverters. 1. In the finger flexors and wrist flexors. Ambulates with a quad cane, no evidence of toe drag or knee instability. Uses left AFO  Psychiatric: She has a normal mood and affect.  Nursing note and vitals reviewed.         Assessment & Plan:  1. Left lower limb spasticity post stroke. Has had benefit with the Botox, particularly with plantar flexion and foot inversion spasticity, less so with toe flexion spasticity, FHL, and FDL  Posterior tibialis 75 units Gastroc 25 unitsx2 Soleus 25 units times 1  increase FHL and FDL to 100 units Consider flexor hallucis. Brevis 25 u injection  Repeat on or after May 9

## 2016-10-31 NOTE — Patient Instructions (Signed)
Partially recovered in left arm and left leg and still improving.

## 2016-11-10 MED FILL — CHLORTHALIDONE 25 MG TABLET: 25 | 30 days supply | Qty: 30 | Fill #2

## 2016-11-17 MED FILL — PRAVASTATIN NA 20 MG TAB: 20 | 30 days supply | Qty: 30 | Fill #5

## 2016-11-17 MED FILL — AMLODIPINE BESYLATE 10 MG T: 10 | 30 days supply | Qty: 30 | Fill #4

## 2016-11-24 MED FILL — CLOPIDOGREL 75 MG TABLET: 75 | 30 days supply | Qty: 30 | Fill #5

## 2016-11-24 MED FILL — LOSARTAN POTASSIUM 100 MG T: 100 | 30 days supply | Qty: 30 | Fill #5

## 2016-12-12 MED FILL — CHLORTHALIDONE 25 MG TABLET: 25 | 30 days supply | Qty: 30 | Fill #3

## 2016-12-15 MED FILL — AMLODIPINE BESYLATE 10 MG T: 10 | 30 days supply | Qty: 30 | Fill #5

## 2016-12-15 MED FILL — PRAVASTATIN NA 20 MG TAB: 20 | 30 days supply | Qty: 30 | Fill #6

## 2016-12-16 ENCOUNTER — Ambulatory Visit: Payer: BLUE CROSS/BLUE SHIELD | Attending: Family Medicine | Admitting: Family Medicine

## 2016-12-16 ENCOUNTER — Encounter: Payer: Self-pay | Admitting: Family Medicine

## 2016-12-16 VITALS — BP 119/75 | HR 100 | Temp 98.0°F | Wt 168.4 lb

## 2016-12-16 DIAGNOSIS — E119 Type 2 diabetes mellitus without complications: Secondary | ICD-10-CM | POA: Diagnosis not present

## 2016-12-16 DIAGNOSIS — I10 Essential (primary) hypertension: Secondary | ICD-10-CM | POA: Diagnosis not present

## 2016-12-16 LAB — GLUCOSE, POCT (MANUAL RESULT ENTRY): POC Glucose: 95 mg/dl (ref 70–99)

## 2016-12-16 MED ORDER — CHLORTHALIDONE 25 MG PO TABS: 25.0000 mg | ORAL_TABLET | Freq: Every day | ORAL | 5 refills | Status: DC

## 2016-12-16 NOTE — Progress Notes (Signed)
Subjective:  Patient ID: Rebecca Yoder, female    DOB: 04-08-1966  Age: 51 y.o. MRN: [id number]  CC: Hypertension   HPI Rebecca Yoder has hx of CVA (x 4) with L sided weakness, DM2, HTN she presents for   1. HTN: compliant with regimen. No HA, CP, SOB.  She did have a fall about 2 month associated with being overheated. She denies recurrent.   2. DM2: diet controlled. No vision changes, polyuria or polydipsia.    Social History  Substance Use Topics  . Smoking status: Never Smoker  . Smokeless tobacco: Never Used  . Alcohol use No    Outpatient Medications Prior to Visit  Medication Sig Dispense Refill  . acetaminophen (TYLENOL) 325 MG tablet Take 2 tablets (650 mg total) by mouth every 6 (six) hours as needed. 20 tablet 0  . amLODipine (NORVASC) 10 MG tablet Take 1 tablet (10 mg total) by mouth daily. 30 tablet 11  . chlorthalidone (HYGROTON) 25 MG tablet Take 1 tablet (25 mg total) by mouth daily. 30 tablet 5  . clopidogrel (PLAVIX) 75 MG tablet Take 1 tablet (75 mg total) by mouth daily. 30 tablet 11  . ibuprofen (ADVIL,MOTRIN) 600 MG tablet Take 1 tablet (600 mg total) by mouth every 8 (eight) hours as needed. For painful menses 30 tablet 0  . losartan (COZAAR) 100 MG tablet Take 1 tablet (100 mg total) by mouth daily. 30 tablet 11  . Multiple Vitamin (MULTIVITAMIN WITH MINERALS) TABS tablet Take 1 tablet by mouth daily.    . pantoprazole (PROTONIX) 40 MG tablet TAKE 1 TABLET BY MOUTH prn 30 tablet 2  . pravastatin (PRAVACHOL) 20 MG tablet Take 1 tablet (20 mg total) by mouth daily. 30 tablet 11  . PROAIR HFA 108 (90 Base) MCG/ACT inhaler INHALE 2 PUFFS INTO THE LUNGS EVERY 6 HOURS AS NEEDED FOR WHEEZING. 8.5 g 0  . triamcinolone ointment (KENALOG) 0.5 % Apply 1 application topically 2 (two) times daily. (Patient taking differently: Apply 1 application topically 2 (two) times daily as needed. ) 30 g 0   No facility-administered medications prior to visit.      ROS Review of Systems  Constitutional: Negative for chills and fever.  Eyes: Negative for visual disturbance.  Respiratory: Negative for shortness of breath.   Cardiovascular: Negative for chest pain.  Gastrointestinal: Negative for abdominal pain and blood in stool.  Endocrine: Negative for polydipsia, polyphagia and polyuria.  Genitourinary: Negative for frequency.  Musculoskeletal: Negative for arthralgias and back pain.  Skin: Negative for rash.  Allergic/Immunologic: Negative for immunocompromised state.  Neurological: Positive for weakness (L sided ). Negative for headaches.  Hematological: Negative for adenopathy. Does not bruise/bleed easily.  Psychiatric/Behavioral: Negative for dysphoric mood and suicidal ideas.    Objective:  BP 119/75   Pulse 100   Temp 98 F (36.7 C) (Oral)   Wt 168 lb 6.4 oz (76.4 kg)   SpO2 92%   BMI 28.91 kg/m   BP/Weight 12/16/2016 12/26/6158 02/10/7105  Systolic BP 269 485 462  Diastolic BP 75 88 76  Wt. (Lbs) 168.4 - 162  BMI 28.91 - 27.81    Physical Exam  Constitutional: She is oriented to person, place, and time. She appears well-developed and well-nourished. No distress.  HENT:  Head: Normocephalic and atraumatic.  Cardiovascular: Normal rate, regular rhythm, normal heart sounds and intact distal pulses.   Pulmonary/Chest: Effort normal and breath sounds normal.  Musculoskeletal: She exhibits no edema.  Left shoulder: She exhibits decreased range of motion and decreased strength. She exhibits no tenderness, no bony tenderness, no swelling, no effusion, no crepitus, no deformity, no laceration and normal pulse.       Left hip: She exhibits decreased strength.  Neurological: She is alert and oriented to person, place, and time.  Skin: Skin is warm and dry. No rash noted.  Psychiatric: She has a normal mood and affect.    Lab Results  Component Value Date   HGBA1C 5.8 09/15/2016   CBG 95 Assessment & Plan:   Laurinda  was seen today for hypertension.  Diagnoses and all orders for this visit:  Controlled type 2 diabetes mellitus without complication, without long-term current use of insulin (HCC) -     POCT glucose (manual entry)  Essential hypertension -     chlorthalidone (HYGROTON) 25 MG tablet; Take 1 tablet (25 mg total) by mouth daily.   No orders of the defined types were placed in this encounter.   Follow-up: Return in about 3 months (around 03/18/2017) for HTN and diabetes.   Boykin Nearing MD

## 2016-12-16 NOTE — Assessment & Plan Note (Signed)
A: well controlled P: Continue current regimen

## 2016-12-16 NOTE — Assessment & Plan Note (Signed)
Well-controlled.  Continue current regimen. 

## 2016-12-16 NOTE — Patient Instructions (Addendum)
Rebecca Yoder was seen today for hypertension.  Diagnoses and all orders for this visit:  Controlled type 2 diabetes mellitus without complication, without long-term current use of insulin (HCC) -     POCT glucose (manual entry)  Essential hypertension -     chlorthalidone (HYGROTON) 25 MG tablet; Take 1 tablet (25 mg total) by mouth daily.   f/u in 3 months for HTN and diabetes  Dr. Adrian Blackwater

## 2016-12-19 ENCOUNTER — Encounter: Payer: Self-pay | Admitting: Physical Medicine & Rehabilitation

## 2016-12-19 ENCOUNTER — Encounter: Payer: BLUE CROSS/BLUE SHIELD | Attending: Physical Medicine & Rehabilitation

## 2016-12-19 ENCOUNTER — Ambulatory Visit (HOSPITAL_BASED_OUTPATIENT_CLINIC_OR_DEPARTMENT_OTHER): Payer: BLUE CROSS/BLUE SHIELD | Admitting: Physical Medicine & Rehabilitation

## 2016-12-19 VITALS — BP 114/80 | HR 102 | Resp 14

## 2016-12-19 DIAGNOSIS — M7502 Adhesive capsulitis of left shoulder: Secondary | ICD-10-CM | POA: Insufficient documentation

## 2016-12-19 DIAGNOSIS — G8110 Spastic hemiplegia affecting unspecified side: Secondary | ICD-10-CM

## 2016-12-19 DIAGNOSIS — I69954 Hemiplegia and hemiparesis following unspecified cerebrovascular disease affecting left non-dominant side: Secondary | ICD-10-CM | POA: Diagnosis not present

## 2016-12-19 DIAGNOSIS — S43002A Unspecified subluxation of left shoulder joint, initial encounter: Secondary | ICD-10-CM | POA: Diagnosis not present

## 2016-12-19 DIAGNOSIS — R269 Unspecified abnormalities of gait and mobility: Secondary | ICD-10-CM | POA: Insufficient documentation

## 2016-12-19 DIAGNOSIS — Z8673 Personal history of transient ischemic attack (TIA), and cerebral infarction without residual deficits: Secondary | ICD-10-CM | POA: Diagnosis present

## 2016-12-19 DIAGNOSIS — M7542 Impingement syndrome of left shoulder: Secondary | ICD-10-CM | POA: Diagnosis not present

## 2016-12-19 DIAGNOSIS — M25512 Pain in left shoulder: Secondary | ICD-10-CM | POA: Diagnosis not present

## 2016-12-19 DIAGNOSIS — G8194 Hemiplegia, unspecified affecting left nondominant side: Secondary | ICD-10-CM | POA: Diagnosis present

## 2016-12-19 NOTE — Progress Notes (Signed)
Botox Injection for spasticity using needle EMG guidance  Dilution: 50 Units/ml Indication: Severe spasticity which interferes with ADL,mobility and/or  hygiene and is unresponsive to medication management and other conservative care Informed consent was obtained after describing risks and benefits of the procedure with the patient. This includes bleeding, bruising, infection, excessive weakness, or medication side effects. A REMS form is on file and signed. Needle: 25g 2" needle electrode Number of units per muscle Left Pronator teres 50 Pronator quad 25  Posterior tibialis 75 Gastroc medial 75 Soleus 25 FHL 50 FDL 100 All injections were done after obtaining appropriate EMG activity and after negative drawback for blood. The patient tolerated the procedure well. Post procedure instructions were given. A followup appointment was made.

## 2016-12-19 NOTE — Patient Instructions (Signed)

## 2016-12-22 ENCOUNTER — Other Ambulatory Visit: Payer: Self-pay | Admitting: Family Medicine

## 2016-12-22 MED FILL — LOSARTAN POTASSIUM 100 MG T: 100 | 30 days supply | Qty: 30 | Fill #6

## 2016-12-22 MED FILL — PROAIR HFA 90 MCG INHALER: 108 (90 BAS | 25 days supply | Qty: 9 | Fill #0

## 2016-12-22 MED FILL — CLOPIDOGREL 75 MG TABLET: 75 | 30 days supply | Qty: 30 | Fill #6

## 2016-12-24 ENCOUNTER — Encounter: Payer: Self-pay | Admitting: Family Medicine

## 2017-01-12 MED FILL — CHLORTHALIDONE 25 MG TABLET: 25 | 30 days supply | Qty: 30 | Fill #4

## 2017-01-12 MED FILL — AMLODIPINE BESYLATE 10 MG T: 10 | 30 days supply | Qty: 30 | Fill #6

## 2017-01-19 IMAGING — CT CT ANGIO NECK
1 of 9 series · 1 of 33 positions shown · IV contrast (Iohexol (Omnipaque 350))
Comparison: 06/11/2015 and 06/09/2013 unenhanced head CT.
06/09/2013 brain MR and MR angiogram.

CLINICAL DATA: 49-year-old hypertensive female with hyperlipidemia
and history of prior stroke presenting with sudden onset of
left-sided paralysis and slurred speech. Subsequent encounter.

EXAM:
CT ANGIOGRAPHY HEAD AND NECK
TECHNIQUE: Multidetector CT imaging of the head and neck was performed using
the standard protocol during bolus administration of intravenous
contrast. Multiplanar CT image reconstructions and MIPs were
obtained to evaluate the vascular anatomy. Carotid stenosis
measurements (when applicable) are obtained utilizing NASCET
criteria, using the distal internal carotid diameter as the
denominator.
CONTRAST:  50mL OMNIPAQUE IOHEXOL 350 MG/ML SOLN

[Series 200: locator · axial · 0.49mm/px · 1 of 1 slices shown]
[im 1/1  soft-tissue]
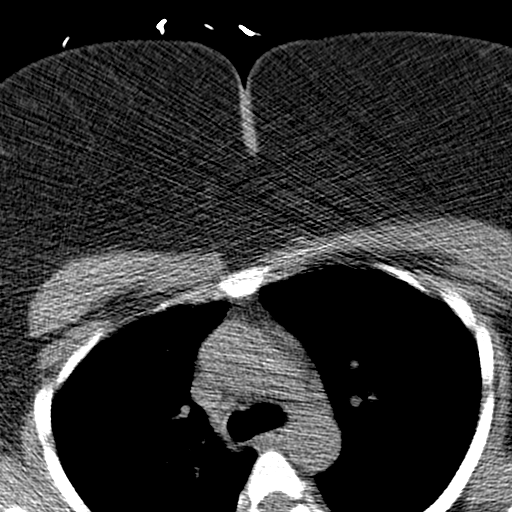

[1 of 33 positions shown; findings below may reference images not displayed]

FINDINGS: CT HEAD

Brain: Remote right frontal lobe infarct with encephalomalacia and
subsequent dilation of the right lateral ventricle. Question acute
extension of infarct along the superior margin. No intracranial
hemorrhage. No intracranial mass or abnormal enhancement.

Calvarium and skull base: Negative.

Paranasal sinuses: Clear.

Orbits: Negative.

Prominent cerumen right ear.

CTA NECK

Aortic arch: 2 vessel aortic arch with mild ectasia. Pulsation
artifact limits evaluation of the ascending thoracic aorta.

Right carotid system: Web-like narrowing proximal right internal
carotid artery with less than 50% diameter stenosis.

Left carotid system: Ectatic left common carotid artery. The distal
left common carotid artery extends posterior and medial to the hyoid
with hyoid compressing the anterior lateral aspect of the distal
left common carotid artery with subsequent mild to slight moderate
narrowing. No significant stenosis of the left carotid bifurcation

Vertebral arteries:Mild narrowing distal left subclavian artery.
Left vertebral artery is dominant. Slight narrowing proximal left
vertebral artery. Mild irregularity of the vertical segment of
vertebral arteries bilaterally without high-grade stenosis.

Skeleton: Mild cervical spondylotic changes most prominent C5-6.

Other neck: No worrisome primary neck mass. Scattered normal to top
normal size lymph nodes more notable on the right. No worrisome lung
apical lesion. There may be a coronary artery calcifications.

CTA HEAD

Anterior circulation: Moderate narrowing right internal carotid
artery cavernous segment. Fetal type contribution to the right
posterior cerebral artery.

Occluded M1 segment of the right middle cerebral artery. This
appears to have progressed since the prior MR angiogram (which was
significantly motion degraded). Decrease number of visualized right
middle cerebral artery branch vessels consistent with remote infarct
and possibly acute infarct.

Hypoplastic A1 segment right anterior cerebral artery.

Mild irregularity and narrowing left internal carotid artery
cavernous segment.

Mild irregularity and narrowing left middle cerebral artery M1
segment.

Posterior circulation: Left vertebral artery is dominant. Slight
irregularity distal vertebral arteries.

Narrowed irregular basilar artery.

Venous sinuses: Patent. Probable artifact within the non dominant
left jugular bulb and left internal jugular vein.

Anatomic variants: As above.

Delayed phase: As above
IMPRESSION: CT HEAD

Remote right frontal lobe infarct with encephalomalacia and
subsequent dilation of the right lateral ventricle. Question acute
extension of infarct along the superior margin.

CTA HEAD

Moderate narrowing right internal carotid artery cavernous segment.

Occluded M1 segment of the right middle cerebral artery. This
appears to have progressed since the prior MR angiogram (which was
significantly motion degraded). Decrease number of visualized right
middle cerebral artery branch vessels consistent with remote infarct
and possibly acute infarct.

Hypoplastic A1 segment right anterior cerebral artery.

Mild irregularity and narrowing left internal carotid artery
cavernous segment.

Mild irregularity and narrowing left middle cerebral artery M1
segment.

Left vertebral artery is dominant. Slight irregularity distal
vertebral arteries.

Narrowed irregular basilar artery.

CTA NECK

Web-like narrowing proximal right internal carotid artery with less
than 50% diameter stenosis.

Ectatic distal left common carotid artery extends posterior and
medial to the hyoid with hyoid compressing the anterior lateral
aspect of the distal left common carotid artery with subsequent mild
to slight moderate narrowing.

No significant stenosis of the left carotid bifurcation.

Left vertebral artery is dominant. Slight narrowing proximal left
vertebral artery. Mild irregularity of the vertical segment of
vertebral arteries bilaterally without high-grade stenosis.

Scattered normal to top normal size lymph nodes more notable on the
right.

There may be coronary artery calcifications.

## 2017-01-19 MED FILL — ?CLOPIDOGREL 75 MG TABLET: 75 | 30 days supply | Qty: 30 | Fill #7

## 2017-01-19 MED FILL — PRAVASTATIN NA 20 MG TAB: 20 | 30 days supply | Qty: 30 | Fill #7

## 2017-01-19 MED FILL — LOSARTAN POTASSIUM 100 MG T: 100 | 30 days supply | Qty: 30 | Fill #7

## 2017-01-20 IMAGING — CT CT HEAD W/O CM
2 series · 15 of 30 positions shown, 17 images · non-contrast
Comparison: CTA head and neck 06/11/2015, and earlier.

CLINICAL DATA: 49-year-old female code stroke patient with slurred
speech and left side weakness. Frontal headache. Chronic right MCA
vessel disease in infarct. Initial encounter.

EXAM:
CT HEAD WITHOUT CONTRAST
TECHNIQUE: Contiguous axial images were obtained from the base of the skull
through the vertex without intravenous contrast.

[Series 2: head without · axial · non-contrast · 0.42mm/px · z∈[+1374,+1489]mm · 7 of 31 slices shown, 9 images]
[im 4/31  brain]
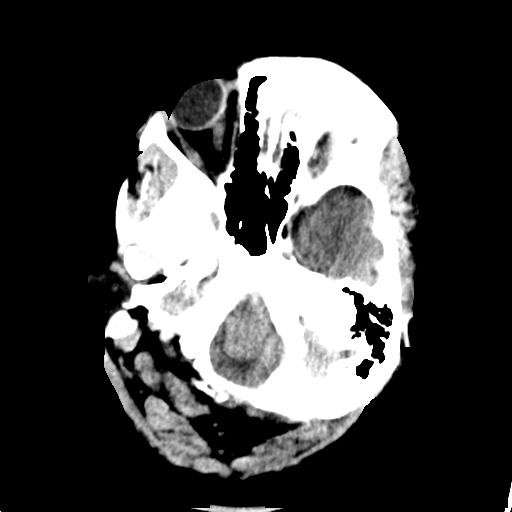
[im 4/31  bone]
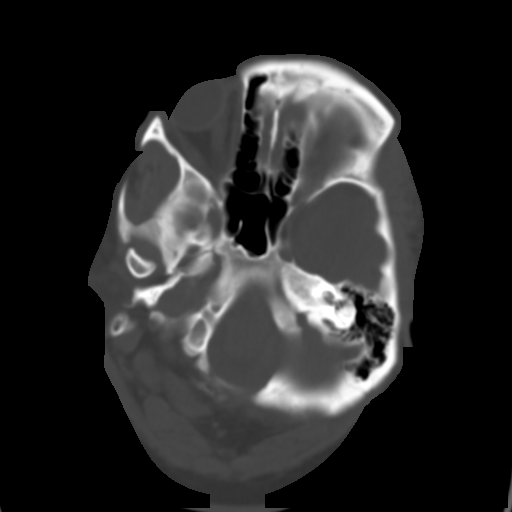
[im 8/31  brain]
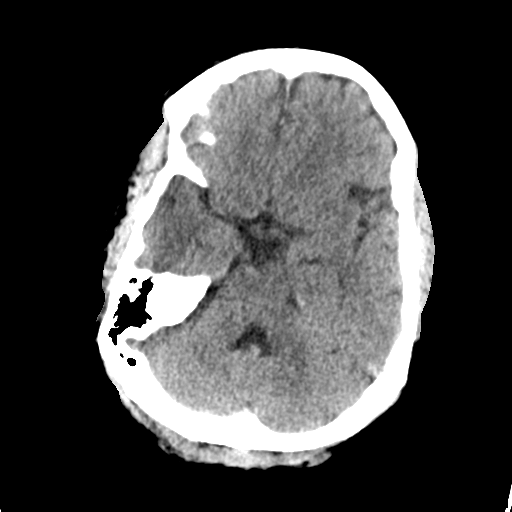
[im 12/31  brain]
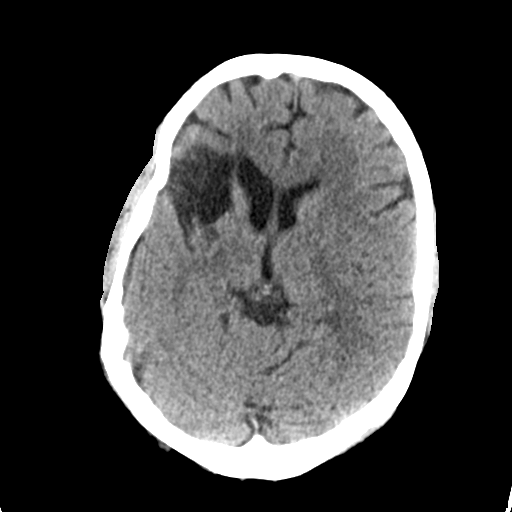
[im 16/31  brain]
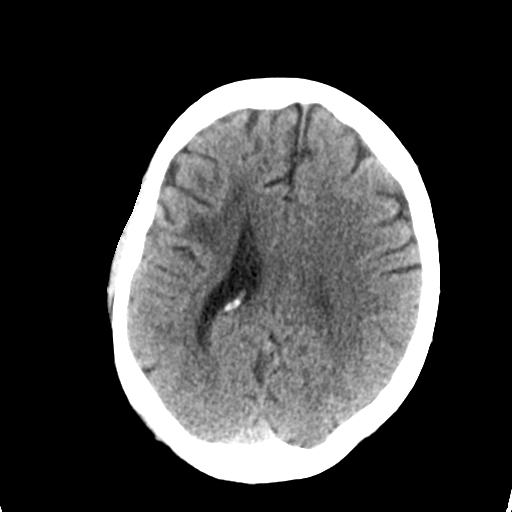
[im 19/31  brain]
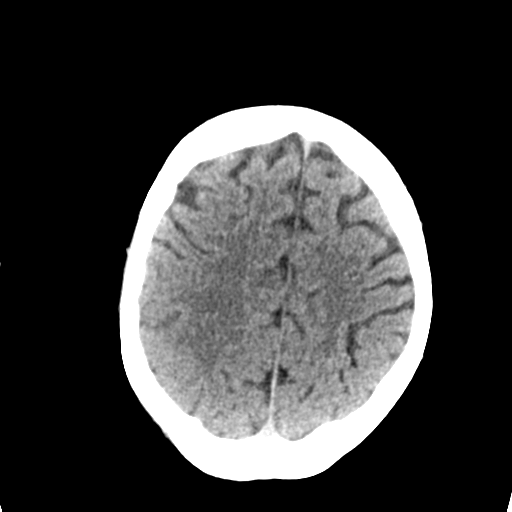
[im 19/31  bone]
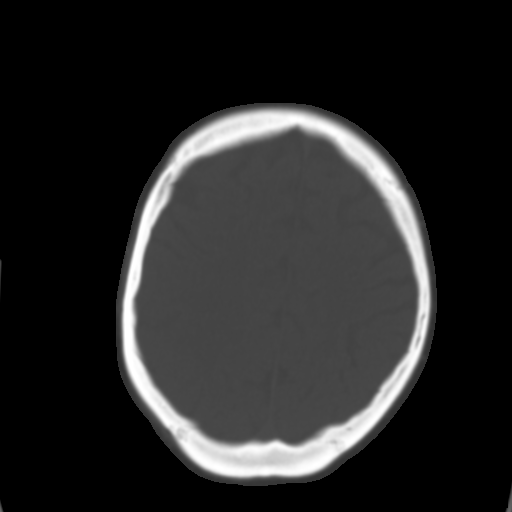
[im 23/31  brain]
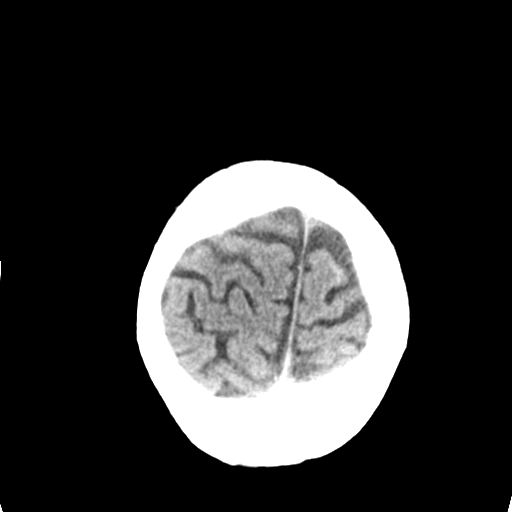
[im 27/31  brain]
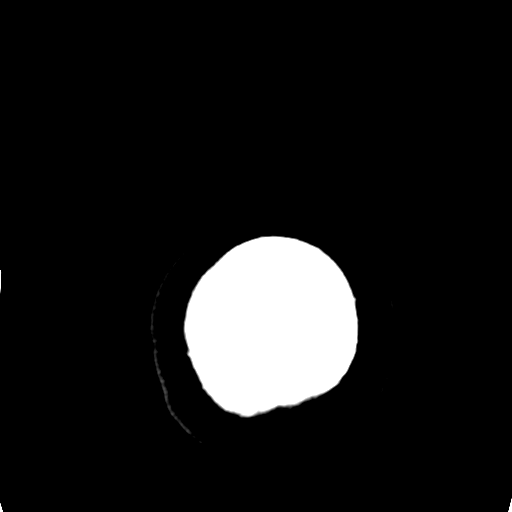

[Series 3: head bone · axial · 0.42mm/px · z∈[+1373,+1493]mm · 8 of 76 slices shown]
[im 8/76  bone]
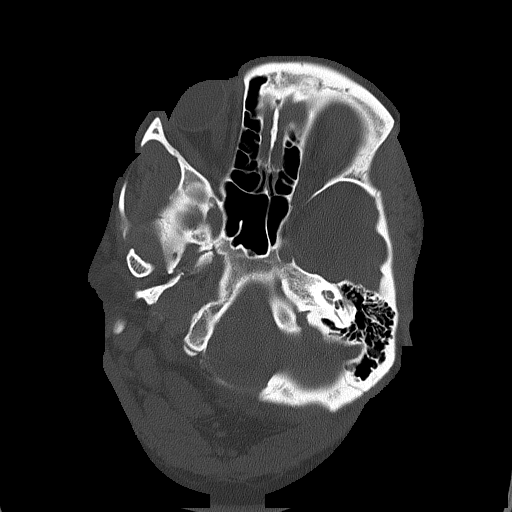
[im 16/76  bone]
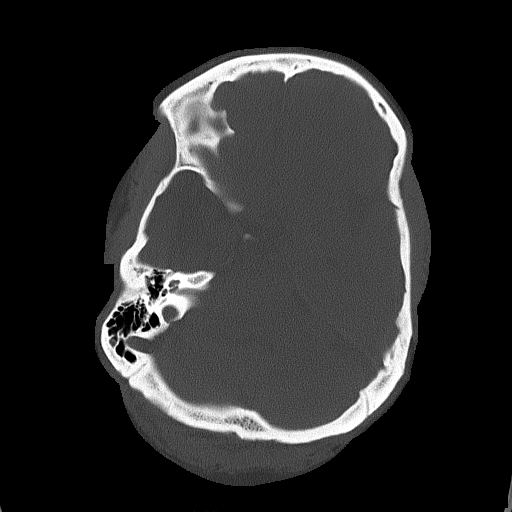
[im 23/76  bone]
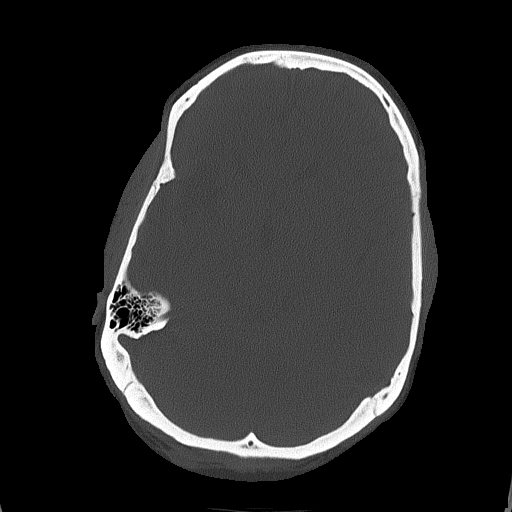
[im 34/76  bone]
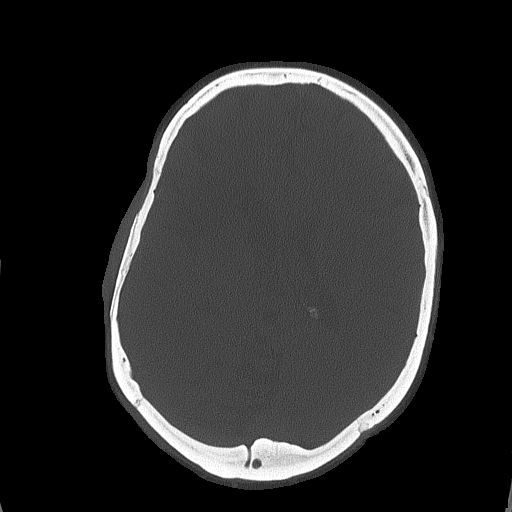
[im 42/76  bone]
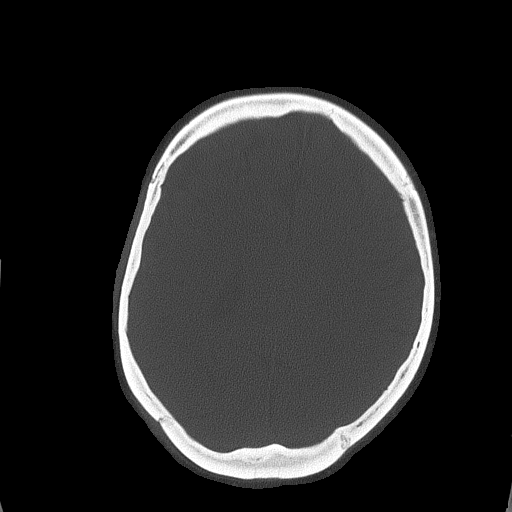
[im 53/76  bone]
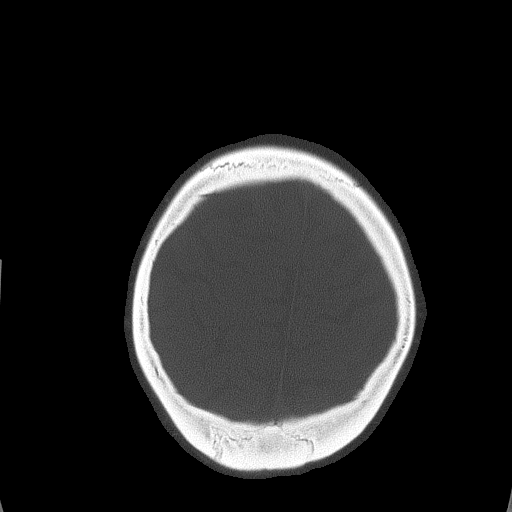
[im 61/76  bone]
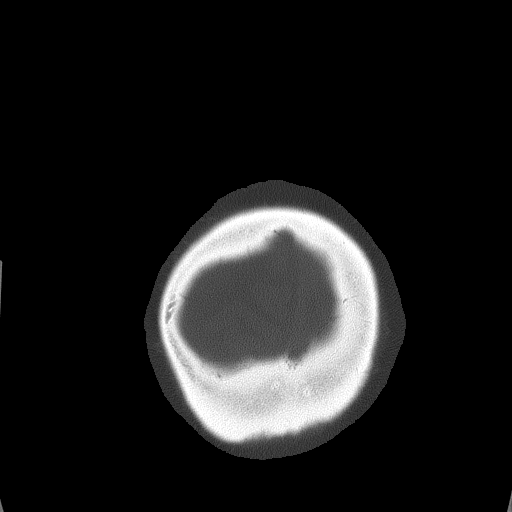
[im 68/76  bone]
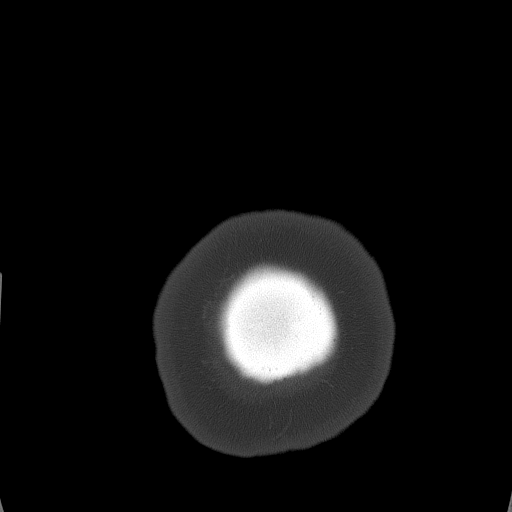

[15 of 30 positions shown; findings below may reference images not displayed]

FINDINGS: Visualized paranasal sinuses and mastoids are clear. No acute
osseous abnormality identified. Rightward gaze deviation. No acute
scalp or orbits soft tissue finding.

Chronic right insula and operculum encephalomalacia. This appears
stable since 4039. No superimposed acute right hemisphere cortically
based infarct is identified. No acute intracranial hemorrhage
identified. No midline shift, mass effect, or evidence of
intracranial mass lesion. Stable ventricle size and configuration.
Stable and normal left hemisphere gray-white matter differentiation.
IMPRESSION: Stable non contrast CT appearance of the brain, chronic right MCA
infarct. No acute cortically based infarct or intracranial
hemorrhage identified.

## 2017-01-30 ENCOUNTER — Encounter: Payer: BLUE CROSS/BLUE SHIELD | Attending: Physical Medicine & Rehabilitation

## 2017-01-30 ENCOUNTER — Encounter: Payer: Self-pay | Admitting: Physical Medicine & Rehabilitation

## 2017-01-30 ENCOUNTER — Ambulatory Visit (HOSPITAL_BASED_OUTPATIENT_CLINIC_OR_DEPARTMENT_OTHER): Payer: BLUE CROSS/BLUE SHIELD | Admitting: Physical Medicine & Rehabilitation

## 2017-01-30 VITALS — BP 132/85 | HR 105

## 2017-01-30 DIAGNOSIS — G8110 Spastic hemiplegia affecting unspecified side: Secondary | ICD-10-CM | POA: Diagnosis not present

## 2017-01-30 DIAGNOSIS — G8194 Hemiplegia, unspecified affecting left nondominant side: Secondary | ICD-10-CM | POA: Diagnosis present

## 2017-01-30 DIAGNOSIS — I69954 Hemiplegia and hemiparesis following unspecified cerebrovascular disease affecting left non-dominant side: Secondary | ICD-10-CM | POA: Diagnosis not present

## 2017-01-30 DIAGNOSIS — Z8673 Personal history of transient ischemic attack (TIA), and cerebral infarction without residual deficits: Secondary | ICD-10-CM | POA: Diagnosis present

## 2017-01-30 DIAGNOSIS — S43002A Unspecified subluxation of left shoulder joint, initial encounter: Secondary | ICD-10-CM | POA: Insufficient documentation

## 2017-01-30 DIAGNOSIS — M7542 Impingement syndrome of left shoulder: Secondary | ICD-10-CM | POA: Diagnosis not present

## 2017-01-30 DIAGNOSIS — M7502 Adhesive capsulitis of left shoulder: Secondary | ICD-10-CM | POA: Diagnosis not present

## 2017-01-30 DIAGNOSIS — R269 Unspecified abnormalities of gait and mobility: Secondary | ICD-10-CM | POA: Insufficient documentation

## 2017-01-30 DIAGNOSIS — M25512 Pain in left shoulder: Secondary | ICD-10-CM | POA: Insufficient documentation

## 2017-01-30 NOTE — Patient Instructions (Signed)
Get some [place]

## 2017-01-30 NOTE — Progress Notes (Signed)
Subjective:    Patient ID: Rebecca Yoder, female    DOB: 13-Jan-1966, 51 y.o.   MRN: [id number]  HPI  12/19/16 Botox LLE  Pronator teres 50 Pronator quad 25  Posterior tibialis 75 Gastroc medial 75 Soleus 25 FHL 50 FDL 100 Pain Inventory Average Pain 3 Pain Right Now 0 My pain is sharp  In the last 24 hours, has pain interfered with the following? General activity 5 Relation with others 7 Enjoyment of life 6 What TIME of day is your pain at its worst? morning Sleep (in general) Good  Pain is worse with: unsure Pain improves with: therapy/exercise Relief from Meds: 10  Mobility walk without assistance walk with assistance use a cane use a wheelchair  Function disabled: date disabled 2016  Neuro/Psych trouble walking  Prior Studies Any changes since last visit?  no  Physicians involved in your care Any changes since last visit?  no   Family History  Problem Relation Age of Onset  . Multiple myeloma Mother    Social History   Social History  . Marital status: Single    Spouse name: N/A  . Number of children: 0  . Years of education: 12th   Occupational History  . [employer]   Social History Main Topics  . Smoking status: Never Smoker  . Smokeless tobacco: Never Used  . Alcohol use No  . Drug use: No  . Sexual activity: Yes   Other Topics Concern  . Not on file   Social History Narrative  . No narrative on file   Past Surgical History:  Procedure Laterality Date  . COSMETIC SURGERY    . OVARIAN CYST REMOVAL  1990's   Past Medical History:  Diagnosis Date  . ASTHMA 12/10/2009  . CEREBROVASCULAR ACCIDENT, HX OF 12/10/2009  . Complication of anesthesia    "just can't get me up" (06/09/2013)  . Embolic stroke of right basal ganglia ([place]) 06/11/2015   with L residual weakness/hemiparesis   . HYPERLIPIDEMIA 12/10/2009  . HYPERTENSION 12/10/2009  . Stroke [facility]) 2008   denies residual on 06/09/2013  . Tendonitis of wrist, right     There were no vitals taken for this visit.  Opioid Risk Score:   Fall Risk Score:  `1  Depression screen PHQ 2/9  Depression screen [facility] 2/9 09/15/2016 07/15/2016 06/23/2016 04/25/2016 03/28/2016 03/18/2016 12/11/2015  Decreased Interest 3 0 2 0 0 1 3  Down, Depressed, Hopeless 0 0 0 0 0 1 0  PHQ - 2 Score 3 0 2 0 0 2 3  Altered sleeping 2 - 2 - - 2 1  Tired, decreased energy 2 - 2 - - 0 0  Change in appetite 3 - 0 - - 0 0  Feeling bad or failure about yourself  0 - 0 - - 0 0  Trouble concentrating 0 - 0 - - 0 0  Moving slowly or fidgety/restless 2 - 0 - - 0 2  Suicidal thoughts 0 - 3 - - 0 0  PHQ-9 Score 12 - 9 - - 4 6  Difficult doing work/chores - - - - - - -     Review of Systems  Constitutional: Negative.   HENT: Negative.   Eyes: Negative.   Respiratory: Negative.   Cardiovascular: Negative.   Gastrointestinal: Negative.   Endocrine: Negative.   Genitourinary: Negative.   Musculoskeletal: Negative.   Skin: Negative.   Allergic/Immunologic: Negative.   Neurological: Negative.   Hematological: Negative.  Psychiatric/Behavioral: Negative.   All other systems reviewed and are negative.      Objective:   Physical Exam  Pronator tone MAS 1        Assessment & Plan:  1.  Left spastic hemiplegia, overall did well with last botulinum toxin treatment except. No clearcut benefit from pronator injections. We'll skip upper extremity Botox and assess pronator spasticity. Continue current dosing in the left lower extremity with exception of the soleus, which will be discontinued  Posterior tibialis 75 Gastroc medial 75  FHL 50 FDL 100

## 2017-02-16 MED FILL — PRAVASTATIN NA 20 MG TAB: 20 | 30 days supply | Qty: 30 | Fill #8

## 2017-02-16 MED FILL — CHLORTHALIDONE 25 MG TABLET: 25 | 30 days supply | Qty: 30 | Fill #5

## 2017-02-16 MED FILL — AMLODIPINE BESYLATE 10 MG T: 10 | 30 days supply | Qty: 30 | Fill #7

## 2017-02-23 MED FILL — ?CLOPIDOGREL 75 MG TABLET: 75 | 30 days supply | Qty: 30 | Fill #8

## 2017-02-23 MED FILL — LOSARTAN POTASSIUM 100 MG T: 100 | 30 days supply | Qty: 30 | Fill #8

## 2017-03-16 MED FILL — CHLORTHALIDONE 25 MG TABLET: 25 | 30 days supply | Qty: 30 | Fill #0

## 2017-03-16 MED FILL — PRAVASTATIN SODIUM 20 MG TA: 20 | 30 days supply | Qty: 30 | Fill #9

## 2017-03-17 ENCOUNTER — Ambulatory Visit: Payer: BLUE CROSS/BLUE SHIELD | Attending: Family Medicine | Admitting: Family Medicine

## 2017-03-17 ENCOUNTER — Encounter: Payer: Self-pay | Admitting: Family Medicine

## 2017-03-17 VITALS — BP 100/80 | HR 93 | Temp 98.4°F | Wt 169.6 lb

## 2017-03-17 DIAGNOSIS — I10 Essential (primary) hypertension: Secondary | ICD-10-CM | POA: Insufficient documentation

## 2017-03-17 DIAGNOSIS — E119 Type 2 diabetes mellitus without complications: Secondary | ICD-10-CM | POA: Diagnosis not present

## 2017-03-17 DIAGNOSIS — J4530 Mild persistent asthma, uncomplicated: Secondary | ICD-10-CM | POA: Insufficient documentation

## 2017-03-17 DIAGNOSIS — I69354 Hemiplegia and hemiparesis following cerebral infarction affecting left non-dominant side: Secondary | ICD-10-CM | POA: Diagnosis not present

## 2017-03-17 DIAGNOSIS — Z7902 Long term (current) use of antithrombotics/antiplatelets: Secondary | ICD-10-CM | POA: Insufficient documentation

## 2017-03-17 LAB — POCT GLYCOSYLATED HEMOGLOBIN (HGB A1C): Hemoglobin A1C: 5.8

## 2017-03-17 LAB — GLUCOSE, POCT (MANUAL RESULT ENTRY): POC GLUCOSE: 104 mg/dL — AB (ref 70–99)

## 2017-03-17 MED ORDER — FLUTICASONE PROPIONATE HFA 44 MCG/ACT IN AERO: 2.0000 | INHALATION_SPRAY | Freq: Two times a day (BID) | RESPIRATORY_TRACT | 5 refills | Status: DC

## 2017-03-17 MED ORDER — AMLODIPINE BESYLATE 5 MG PO TABS
5.0000 mg | ORAL_TABLET | Freq: Every day | ORAL | 11 refills | Status: DC
Start: 2017-03-17 — End: 2017-05-04

## 2017-03-17 MED ORDER — CHLORTHALIDONE 25 MG PO TABS: 25.0000 mg | ORAL_TABLET | Freq: Every day | ORAL | 5 refills | Status: DC

## 2017-03-17 MED FILL — AMLODIPINE BESYLATE 5 MG TA: 5 | 30 days supply | Qty: 30 | Fill #0

## 2017-03-17 NOTE — Patient Instructions (Addendum)
Rebecca Yoder was seen today for hypertension and diabetes.  Diagnoses and all orders for this visit:  Controlled type 2 diabetes mellitus without complication, without long-term current use of insulin (HCC) -     POCT glycosylated hemoglobin (Hb A1C) -     POCT glucose (manual entry)  Essential hypertension -     chlorthalidone (HYGROTON) 25 MG tablet; Take 1 tablet (25 mg total) by mouth daily. -     amLODipine (NORVASC) 5 MG tablet; Take 1 tablet (5 mg total) by mouth daily.  Mild persistent asthma without complication -     fluticasone (FLOVENT HFA) 44 MCG/ACT inhaler; Inhale 2 puffs into the lungs 2 (two) times daily.   Please decrease Norvasc to 5 mg daily due to lower than needed blood pressure of 100/80, goal is 120-130/80  F/u in 6 weeks for flu shot,  HTN (BP recheck) and asthma   Dr. Adrian Blackwater

## 2017-03-17 NOTE — Progress Notes (Signed)
Subjective:  Patient ID: Rebecca Yoder, female    DOB: April 14, 1966  Age: 51 y.o. MRN: [id number]  CC: Hypertension and Diabetes   HPI Rebecca Yoder has hx of CVA (x 4) with L sided weakness, DM2, HTN, asthma she presents for   1. HTN: compliant with regimen. No HA, CP, SOB.  She did have a fall about 2 month associated with being overheated. She denies recurrent.   2. Diabetes type 2: diet controlled. No vision changes, polyuria or polydipsia.    3. Asthma: she uses albuterol 2-3 times per week. She coughs whenever she laughs. She denies chest pain or shortness of breath. She is frustrated with coughing when laughing.   4. Ears clogged up: this occurs for 3-4 days at a time. She denies ear pain. This sensation is on both ears.   Social History  Substance Use Topics  . Smoking status: Never Smoker  . Smokeless tobacco: Never Used  . Alcohol use No    Outpatient Medications Prior to Visit  Medication Sig Dispense Refill  . acetaminophen (TYLENOL) 325 MG tablet Take 2 tablets (650 mg total) by mouth every 6 (six) hours as needed. 20 tablet 0  . amLODipine (NORVASC) 10 MG tablet Take 1 tablet (10 mg total) by mouth daily. 30 tablet 11  . chlorthalidone (HYGROTON) 25 MG tablet Take 1 tablet (25 mg total) by mouth daily. 30 tablet 5  . clopidogrel (PLAVIX) 75 MG tablet Take 1 tablet (75 mg total) by mouth daily. 30 tablet 11  . diphenhydrAMINE (BENADRYL) 25 MG tablet Take 50 mg by mouth at bedtime.    Marland Kitchen ibuprofen (ADVIL,MOTRIN) 600 MG tablet Take 1 tablet (600 mg total) by mouth every 8 (eight) hours as needed. For painful menses 30 tablet 0  . losartan (COZAAR) 100 MG tablet Take 1 tablet (100 mg total) by mouth daily. 30 tablet 11  . Multiple Vitamin (MULTIVITAMIN WITH MINERALS) TABS tablet Take 1 tablet by mouth daily.    . pantoprazole (PROTONIX) 40 MG tablet TAKE 1 TABLET BY MOUTH prn 30 tablet 2  . pravastatin (PRAVACHOL) 20 MG tablet Take 1 tablet (20 mg total) by mouth  daily. 30 tablet 11  . PROAIR HFA 108 (90 Base) MCG/ACT inhaler INHALE 2 PUFFS INTO THE LUNGS EVERY 6 HOURS AS NEEDED FOR WHEEZING. 8.5 g 1  . triamcinolone ointment (KENALOG) 0.5 % Apply 1 application topically 2 (two) times daily. (Patient taking differently: Apply 1 application topically 2 (two) times daily as needed. ) 30 g 0   No facility-administered medications prior to visit.     ROS Review of Systems  Constitutional: Negative for chills and fever.  Eyes: Negative for visual disturbance.  Respiratory: Positive for cough. Negative for shortness of breath.   Cardiovascular: Negative for chest pain.  Gastrointestinal: Negative for abdominal pain and blood in stool.  Endocrine: Negative for polydipsia, polyphagia and polyuria.  Genitourinary: Negative for frequency.  Musculoskeletal: Negative for arthralgias and back pain.  Skin: Negative for rash.  Allergic/Immunologic: Negative for immunocompromised state.  Neurological: Positive for weakness (L sided ). Negative for headaches.  Hematological: Negative for adenopathy. Does not bruise/bleed easily.  Psychiatric/Behavioral: Negative for dysphoric mood and suicidal ideas.    Objective:  BP 100/65   Pulse 93   Temp 98.4 F (36.9 C) (Oral)   Wt 169 lb 9.6 oz (76.9 kg)   SpO2 97%   BMI 29.11 kg/m  Manual repeat of blood pressure 100/80 BP/Weight 03/17/2017 01/30/2017 12/19/2016  Systolic BP 342 876 811  Diastolic BP 65 85 80  Wt. (Lbs) 169.6 - -  BMI 29.11 - -   Wt Readings from Last 3 Encounters:  03/17/17 169 lb 9.6 oz (76.9 kg)  12/16/16 168 lb 6.4 oz (76.4 kg)  10/14/16 162 lb (73.5 kg)    Physical Exam  Constitutional: She is oriented to person, place, and time. She appears well-developed and well-nourished. No distress.  HENT:  Head: Normocephalic and atraumatic.  Cerumen impacting both external auditory canals   Cardiovascular: Normal rate, regular rhythm, normal heart sounds and intact distal pulses.     Pulmonary/Chest: Effort normal. She has wheezes in the right middle field and the right lower field.  Musculoskeletal: She exhibits no edema.       Left shoulder: She exhibits decreased range of motion and decreased strength. She exhibits no tenderness, no bony tenderness, no swelling, no effusion, no crepitus, no deformity, no laceration and normal pulse.       Left hip: She exhibits decreased strength.  Neurological: She is alert and oriented to person, place, and time.  Skin: Skin is warm and dry. No rash noted.  Psychiatric: She has a normal mood and affect.    Lab Results  Component Value Date   HGBA1C 5.8 09/15/2016   Lab Results  Component Value Date   HGBA1C 5.8 03/17/2017   CBG  104  Assessment & Plan:   Rebecca Yoder was seen today for hypertension and diabetes.  Diagnoses and all orders for this visit:  Controlled type 2 diabetes mellitus without complication, without long-term current use of insulin (HCC) -     POCT glycosylated hemoglobin (Hb A1C) -     POCT glucose (manual entry)  Essential hypertension -     chlorthalidone (HYGROTON) 25 MG tablet; Take 1 tablet (25 mg total) by mouth daily. -     amLODipine (NORVASC) 5 MG tablet; Take 1 tablet (5 mg total) by mouth daily.  Mild persistent asthma without complication -     fluticasone (FLOVENT HFA) 44 MCG/ACT inhaler; Inhale 2 puffs into the lungs 2 (two) times daily.   No orders of the defined types were placed in this encounter.   Follow-up: Return in about 6 weeks (around 04/28/2017) for HTN, flu shot, asthma.   Boykin Nearing MD

## 2017-03-19 ENCOUNTER — Ambulatory Visit: Payer: BLUE CROSS/BLUE SHIELD | Admitting: Family Medicine

## 2017-03-19 NOTE — Assessment & Plan Note (Signed)
A: hypertension with slightly over controlled blood pressure P: Decrease Norvasc from 10 to 5 mg daily Continue chlorthalidone 25 mg daily

## 2017-03-19 NOTE — Assessment & Plan Note (Signed)
A: mild persistent asthma Albuterol use > 2 x per week P: Adding low dose inhaled corticosteroid Flovent 44 mcg , 2 puffs BID

## 2017-03-20 ENCOUNTER — Ambulatory Visit (HOSPITAL_BASED_OUTPATIENT_CLINIC_OR_DEPARTMENT_OTHER): Payer: BLUE CROSS/BLUE SHIELD | Admitting: Physical Medicine & Rehabilitation

## 2017-03-20 ENCOUNTER — Encounter: Payer: BLUE CROSS/BLUE SHIELD | Attending: Physical Medicine & Rehabilitation

## 2017-03-20 ENCOUNTER — Encounter: Payer: Self-pay | Admitting: Physical Medicine & Rehabilitation

## 2017-03-20 VITALS — BP 109/72 | HR 89

## 2017-03-20 DIAGNOSIS — G8194 Hemiplegia, unspecified affecting left nondominant side: Secondary | ICD-10-CM | POA: Diagnosis present

## 2017-03-20 DIAGNOSIS — R269 Unspecified abnormalities of gait and mobility: Secondary | ICD-10-CM | POA: Insufficient documentation

## 2017-03-20 DIAGNOSIS — M7542 Impingement syndrome of left shoulder: Secondary | ICD-10-CM | POA: Diagnosis not present

## 2017-03-20 DIAGNOSIS — G8110 Spastic hemiplegia affecting unspecified side: Secondary | ICD-10-CM

## 2017-03-20 DIAGNOSIS — M7502 Adhesive capsulitis of left shoulder: Secondary | ICD-10-CM | POA: Diagnosis not present

## 2017-03-20 DIAGNOSIS — I69954 Hemiplegia and hemiparesis following unspecified cerebrovascular disease affecting left non-dominant side: Secondary | ICD-10-CM | POA: Insufficient documentation

## 2017-03-20 DIAGNOSIS — Z8673 Personal history of transient ischemic attack (TIA), and cerebral infarction without residual deficits: Secondary | ICD-10-CM | POA: Diagnosis present

## 2017-03-20 DIAGNOSIS — S43002A Unspecified subluxation of left shoulder joint, initial encounter: Secondary | ICD-10-CM | POA: Diagnosis not present

## 2017-03-20 DIAGNOSIS — M25512 Pain in left shoulder: Secondary | ICD-10-CM | POA: Insufficient documentation

## 2017-03-20 NOTE — Progress Notes (Signed)
Botox Injection for spasticity using needle EMG guidance  Dilution: 50 Units/ml Indication: Severe spasticity which interferes with ADL,mobility and/or  hygiene and is unresponsive to medication management and other conservative care Informed consent was obtained after describing risks and benefits of the procedure with the patient. This includes bleeding, bruising, infection, excessive weakness, or medication side effects. A REMS form is on file and signed. Needle: 25g 2" needle electrode Number of units per muscle Posterior tibialis 75 Gastroc medial 75  FHL 50 FDL 100 All injections were done after obtaining appropriate EMG activity and after negative drawback for blood. The patient tolerated the procedure well. Post procedure instructions were given. A followup appointment was made.

## 2017-03-20 NOTE — Patient Instructions (Signed)

## 2017-03-23 MED FILL — LOSARTAN POTASSIUM 100 MG T: 100 | 30 days supply | Qty: 30 | Fill #9

## 2017-03-23 MED FILL — ?CLOPIDOGREL 75 MG TABLET: 75 | 30 days supply | Qty: 30 | Fill #9

## 2017-04-14 MED FILL — CHLORTHALIDONE 25 MG TABLET: 25 | 30 days supply | Qty: 30 | Fill #1

## 2017-04-14 MED FILL — AMLODIPINE BESYLATE 5 MG TA: 5 | 30 days supply | Qty: 30 | Fill #1

## 2017-04-14 MED FILL — PRAVASTATIN NA 20 MG TAB: 20 | 30 days supply | Qty: 30 | Fill #10

## 2017-04-22 MED FILL — LOSARTAN POTASSIUM 100 MG T: 100 | 30 days supply | Qty: 30 | Fill #10

## 2017-04-22 MED FILL — CLOPIDOGREL 75 MG TABLET: 75 | 30 days supply | Qty: 30 | Fill #10

## 2017-04-30 MED FILL — PROAIR HFA 90 MCG INHALER: 108 (90 BAS | 25 days supply | Qty: 9 | Fill #1

## 2017-05-01 ENCOUNTER — Ambulatory Visit: Payer: BLUE CROSS/BLUE SHIELD | Admitting: Physical Medicine & Rehabilitation

## 2017-05-04 ENCOUNTER — Ambulatory Visit: Payer: BLUE CROSS/BLUE SHIELD | Attending: Family Medicine | Admitting: Family Medicine

## 2017-05-04 ENCOUNTER — Encounter: Payer: Self-pay | Admitting: Family Medicine

## 2017-05-04 VITALS — BP 108/74 | HR 93 | Temp 98.1°F | Wt 171.8 lb

## 2017-05-04 DIAGNOSIS — E119 Type 2 diabetes mellitus without complications: Secondary | ICD-10-CM | POA: Diagnosis not present

## 2017-05-04 DIAGNOSIS — M79632 Pain in left forearm: Secondary | ICD-10-CM | POA: Diagnosis not present

## 2017-05-04 DIAGNOSIS — I10 Essential (primary) hypertension: Secondary | ICD-10-CM | POA: Diagnosis not present

## 2017-05-04 DIAGNOSIS — Z23 Encounter for immunization: Secondary | ICD-10-CM

## 2017-05-04 DIAGNOSIS — I69354 Hemiplegia and hemiparesis following cerebral infarction affecting left non-dominant side: Secondary | ICD-10-CM | POA: Insufficient documentation

## 2017-05-04 DIAGNOSIS — K219 Gastro-esophageal reflux disease without esophagitis: Secondary | ICD-10-CM | POA: Diagnosis not present

## 2017-05-04 DIAGNOSIS — G8110 Spastic hemiplegia affecting unspecified side: Secondary | ICD-10-CM | POA: Diagnosis not present

## 2017-05-04 DIAGNOSIS — M25532 Pain in left wrist: Secondary | ICD-10-CM | POA: Diagnosis not present

## 2017-05-04 DIAGNOSIS — J45909 Unspecified asthma, uncomplicated: Secondary | ICD-10-CM | POA: Insufficient documentation

## 2017-05-04 DIAGNOSIS — E785 Hyperlipidemia, unspecified: Secondary | ICD-10-CM | POA: Diagnosis not present

## 2017-05-04 DIAGNOSIS — I63511 Cerebral infarction due to unspecified occlusion or stenosis of right middle cerebral artery: Secondary | ICD-10-CM | POA: Diagnosis not present

## 2017-05-04 DIAGNOSIS — Z79899 Other long term (current) drug therapy: Secondary | ICD-10-CM | POA: Insufficient documentation

## 2017-05-04 DIAGNOSIS — Z880 Allergy status to penicillin: Secondary | ICD-10-CM | POA: Diagnosis not present

## 2017-05-04 LAB — GLUCOSE, POCT (MANUAL RESULT ENTRY): POC GLUCOSE: 126 mg/dL — AB (ref 70–99)

## 2017-05-04 MED ORDER — CLOPIDOGREL BISULFATE 75 MG PO TABS: 75.0000 mg | ORAL_TABLET | Freq: Every day | ORAL | 6 refills | Status: DC

## 2017-05-04 MED ORDER — ALBUTEROL SULFATE HFA 108 (90 BASE) MCG/ACT IN AERS: INHALATION_SPRAY | RESPIRATORY_TRACT | 1 refills | Status: DC

## 2017-05-04 MED ORDER — CHLORTHALIDONE 25 MG PO TABS: 25.0000 mg | ORAL_TABLET | Freq: Every day | ORAL | 5 refills | Status: DC

## 2017-05-04 MED ORDER — PRAVASTATIN SODIUM 20 MG PO TABS: 20.0000 mg | ORAL_TABLET | Freq: Every day | ORAL | 11 refills | Status: DC

## 2017-05-04 MED ORDER — BACLOFEN 10 MG PO TABS: 10.0000 mg | ORAL_TABLET | Freq: Two times a day (BID) | ORAL | 3 refills | Status: DC | PRN

## 2017-05-04 MED ORDER — LOSARTAN POTASSIUM 100 MG PO TABS: 100.0000 mg | ORAL_TABLET | Freq: Every day | ORAL | 6 refills | Status: DC

## 2017-05-04 MED ORDER — AMLODIPINE BESYLATE 5 MG PO TABS: 5.0000 mg | ORAL_TABLET | Freq: Every day | ORAL | 6 refills | Status: DC

## 2017-05-04 MED ORDER — PANTOPRAZOLE SODIUM 40 MG PO TBEC: DELAYED_RELEASE_TABLET | ORAL | 6 refills | Status: DC

## 2017-05-04 MED FILL — PANTOPRAZOLE SOD DR 40 MG T: 40 | 30 days supply | Qty: 30 | Fill #0

## 2017-05-04 MED FILL — BACLOFEN 10 MG TABLET: 10 | 30 days supply | Qty: 60 | Fill #0

## 2017-05-04 NOTE — Progress Notes (Signed)
Subjective:  Patient ID: Rebecca Yoder, female    DOB: November 15, 1965  Age: 51 y.o. MRN: [id number]  CC: Hypertension and Diabetes   HPI Rebecca Yoder is a 51 year old female with history of type 2 diabetes mellitus (A1c 5.8),hypertension, right middle cerebral artery stroke with left spastic hemiparesis, GERD who presents today to get established with me.  With regards to her diabetes her blood sugars have been controlled, she is tolerating metformin and denies GI side effects; she has no blurry vision or numbness in extremities. Compliant with her antihypertensive and denies any problems with medications. Her reflux has been controlled.  She recently received Botox injection from Dr. Barbaraann Cao last month and reports some improvement in her symptoms however she does sometimes have left wrist pain and left forearm pain whenever her left arm is in the dependent position.  She denies chest pains or shortness of breath.  Past Medical History:  Diagnosis Date  . ASTHMA 12/10/2009  . CEREBROVASCULAR ACCIDENT, HX OF 12/10/2009  . Complication of anesthesia    "just can't get me up" (06/09/2013)  . Embolic stroke of right basal ganglia ([place]) 06/11/2015   with L residual weakness/hemiparesis   . HYPERLIPIDEMIA 12/10/2009  . HYPERTENSION 12/10/2009  . Stroke [facility]) 2008   denies residual on 06/09/2013  . Tendonitis of wrist, right     Past Surgical History:  Procedure Laterality Date  . COSMETIC SURGERY    . OVARIAN CYST REMOVAL  1990's    Allergies  Allergen Reactions  . Penicillins Rash     Outpatient Medications Prior to Visit  Medication Sig Dispense Refill  . acetaminophen (TYLENOL) 325 MG tablet Take 2 tablets (650 mg total) by mouth every 6 (six) hours as needed. 20 tablet 0  . diphenhydrAMINE (BENADRYL) 25 MG tablet Take 50 mg by mouth at bedtime.    . fluticasone (FLOVENT HFA) 44 MCG/ACT inhaler Inhale 2 puffs into the lungs 2 (two) times daily. 1 Inhaler 5  . Multiple  Vitamin (MULTIVITAMIN WITH MINERALS) TABS tablet Take 1 tablet by mouth daily.    Marland Kitchen triamcinolone ointment (KENALOG) 0.5 % Apply 1 application topically 2 (two) times daily. (Patient taking differently: Apply 1 application topically 2 (two) times daily as needed. ) 30 g 0  . amLODipine (NORVASC) 5 MG tablet Take 1 tablet (5 mg total) by mouth daily. 30 tablet 11  . chlorthalidone (HYGROTON) 25 MG tablet Take 1 tablet (25 mg total) by mouth daily. 30 tablet 5  . clopidogrel (PLAVIX) 75 MG tablet Take 1 tablet (75 mg total) by mouth daily. 30 tablet 11  . losartan (COZAAR) 100 MG tablet Take 1 tablet (100 mg total) by mouth daily. 30 tablet 11  . pantoprazole (PROTONIX) 40 MG tablet TAKE 1 TABLET BY MOUTH prn 30 tablet 2  . pravastatin (PRAVACHOL) 20 MG tablet Take 1 tablet (20 mg total) by mouth daily. 30 tablet 11  . PROAIR HFA 108 (90 Base) MCG/ACT inhaler INHALE 2 PUFFS INTO THE LUNGS EVERY 6 HOURS AS NEEDED FOR WHEEZING. 8.5 g 1  . ibuprofen (ADVIL,MOTRIN) 600 MG tablet Take 1 tablet (600 mg total) by mouth every 8 (eight) hours as needed. For painful menses (Patient not taking: Reported on 05/04/2017) 30 tablet 0   No facility-administered medications prior to visit.     ROS Review of Systems  Constitutional: Negative for activity change, appetite change and fatigue.  HENT: Negative for congestion, sinus pressure and sore throat.   Eyes: Negative for visual  disturbance.  Respiratory: Negative for cough, chest tightness, shortness of breath and wheezing.   Cardiovascular: Negative for chest pain and palpitations.  Gastrointestinal: Negative for abdominal distention, abdominal pain and constipation.  Endocrine: Negative for polydipsia.  Genitourinary: Negative for dysuria and frequency.  Musculoskeletal: Positive for gait problem. Negative for arthralgias and back pain.  Skin: Negative for rash.  Neurological: Negative for tremors, light-headedness and numbness.  Hematological: Does not  bruise/bleed easily.  Psychiatric/Behavioral: Negative for agitation and behavioral problems.    Objective:  BP 108/74   Pulse 93   Temp 98.1 F (36.7 C) (Oral)   Wt 171 lb 12.8 oz (77.9 kg)   SpO2 99%   BMI 29.49 kg/m   BP/Weight 05/04/2017 0/94/0768 0/03/8109  Systolic BP 315 945 859  Diastolic BP 74 72 80  Wt. (Lbs) 171.8 - 169.6  BMI 29.49 - 29.11      Physical Exam  Constitutional: She is oriented to person, place, and time. She appears well-developed and well-nourished.  Cardiovascular: Normal rate, normal heart sounds and intact distal pulses.   No murmur heard. Pulmonary/Chest: Effort normal and breath sounds normal. She has no wheezes. She has no rales. She exhibits no tenderness.  Abdominal: Soft. Bowel sounds are normal. She exhibits no distension and no mass. There is no tenderness.  Musculoskeletal:  Spastic left upper extremity  Neurological: She is alert and oriented to person, place, and time.    Lab Results  Component Value Date   HGBA1C 5.8 03/17/2017    Assessment & Plan:   1. Controlled type 2 diabetes mellitus without complication, without long-term current use of insulin (HCC) Controlled with A1c of 5.8 Continue metformin Diabetic diet - POCT glucose (manual entry)  2. Essential hypertension Controlled Low-sodium diet - amLODipine (NORVASC) 5 MG tablet; Take 1 tablet (5 mg total) by mouth daily.  Dispense: 30 tablet; Refill: 6 - chlorthalidone (HYGROTON) 25 MG tablet; Take 1 tablet (25 mg total) by mouth daily.  Dispense: 30 tablet; Refill: 5 - losartan (COZAAR) 100 MG tablet; Take 1 tablet (100 mg total) by mouth daily.  Dispense: 30 tablet; Refill: 6 - CMP14+EGFR  3. Right middle cerebral artery stroke (HCC) Risk factor modifications High risk for falls - clopidogrel (PLAVIX) 75 MG tablet; Take 1 tablet (75 mg total) by mouth daily.  Dispense: 30 tablet; Refill: 6 - pravastatin (PRAVACHOL) 20 MG tablet; Take 1 tablet (20 mg total) by  mouth daily.  Dispense: 30 tablet; Refill: 11  4. Gastroesophageal reflux disease, esophagitis presence not specified Controlled Avoid late meals - pantoprazole (PROTONIX) 40 MG tablet; TAKE 1 TABLET BY MOUTH prn  Dispense: 30 tablet; Refill: 6  5. Spastic hemiplegia affecting nondominant side (HCC) Continue Botox injections as per rehabilitation Baclofen added to her regimen   Meds ordered this encounter  Medications  . amLODipine (NORVASC) 5 MG tablet    Sig: Take 1 tablet (5 mg total) by mouth daily.    Dispense:  30 tablet    Refill:  6  . clopidogrel (PLAVIX) 75 MG tablet    Sig: Take 1 tablet (75 mg total) by mouth daily.    Dispense:  30 tablet    Refill:  6  . chlorthalidone (HYGROTON) 25 MG tablet    Sig: Take 1 tablet (25 mg total) by mouth daily.    Dispense:  30 tablet    Refill:  5  . pravastatin (PRAVACHOL) 20 MG tablet    Sig: Take 1 tablet (20 mg total) by  mouth daily.    Dispense:  30 tablet    Refill:  11  . losartan (COZAAR) 100 MG tablet    Sig: Take 1 tablet (100 mg total) by mouth daily.    Dispense:  30 tablet    Refill:  6  . pantoprazole (PROTONIX) 40 MG tablet    Sig: TAKE 1 TABLET BY MOUTH prn    Dispense:  30 tablet    Refill:  6  . albuterol (PROAIR HFA) 108 (90 Base) MCG/ACT inhaler    Sig: INHALE 2 PUFFS INTO THE LUNGS EVERY 6 HOURS AS NEEDED FOR WHEEZING.    Dispense:  8.5 g    Refill:  1  . baclofen (LIORESAL) 10 MG tablet    Sig: Take 1 tablet (10 mg total) by mouth 2 (two) times daily as needed for muscle spasms.    Dispense:  60 tablet    Refill:  3    Follow-up: Return in about 2 months (around 07/04/2017) for complete physical exam.   Arnoldo Morale MD

## 2017-05-04 NOTE — Patient Instructions (Signed)

## 2017-05-05 LAB — CMP14+EGFR
A/G RATIO: 1.5 (ref 1.2–2.2)
ALBUMIN: 4.7 g/dL (ref 3.5–5.5)
ALT: 18 IU/L (ref 0–32)
AST: 16 IU/L (ref 0–40)
Alkaline Phosphatase: 65 IU/L (ref 39–117)
BUN / CREAT RATIO: 23 (ref 9–23)
BUN: 18 mg/dL (ref 6–24)
CHLORIDE: 96 mmol/L (ref 96–106)
CO2: 26 mmol/L (ref 20–29)
Calcium: 10 mg/dL (ref 8.7–10.2)
Creatinine, Ser: 0.78 mg/dL (ref 0.57–1.00)
GFR, EST AFRICAN AMERICAN: 103 mL/min/{1.73_m2} (ref >59)
GFR, EST NON AFRICAN AMERICAN: 90 mL/min/{1.73_m2} (ref >59)
Globulin, Total: 3.1 g/dL (ref 1.5–4.5)
Glucose: 104 mg/dL — ABNORMAL HIGH (ref 65–99)
POTASSIUM: 3.8 mmol/L (ref 3.5–5.2)
Sodium: 139 mmol/L (ref 134–144)
TOTAL PROTEIN: 7.8 g/dL (ref 6.0–8.5)

## 2017-05-11 MED FILL — CHLORTHALIDONE 25 MG TABLET: 25 | 30 days supply | Qty: 30 | Fill #2

## 2017-05-11 MED FILL — PRAVASTATIN NA 20 MG TAB: 20 | 30 days supply | Qty: 30 | Fill #11

## 2017-05-11 MED FILL — AMLODIPINE BESYLATE 5 MG TA: 5 | 30 days supply | Qty: 30 | Fill #2

## 2017-05-12 ENCOUNTER — Ambulatory Visit (HOSPITAL_BASED_OUTPATIENT_CLINIC_OR_DEPARTMENT_OTHER): Payer: BLUE CROSS/BLUE SHIELD | Admitting: Physical Medicine & Rehabilitation

## 2017-05-12 ENCOUNTER — Encounter: Payer: BLUE CROSS/BLUE SHIELD | Attending: Physical Medicine & Rehabilitation

## 2017-05-12 ENCOUNTER — Encounter: Payer: Self-pay | Admitting: Physical Medicine & Rehabilitation

## 2017-05-12 VITALS — BP 120/85 | HR 92

## 2017-05-12 DIAGNOSIS — Z8673 Personal history of transient ischemic attack (TIA), and cerebral infarction without residual deficits: Secondary | ICD-10-CM | POA: Diagnosis present

## 2017-05-12 DIAGNOSIS — I69954 Hemiplegia and hemiparesis following unspecified cerebrovascular disease affecting left non-dominant side: Secondary | ICD-10-CM | POA: Insufficient documentation

## 2017-05-12 DIAGNOSIS — R269 Unspecified abnormalities of gait and mobility: Secondary | ICD-10-CM

## 2017-05-12 DIAGNOSIS — M25512 Pain in left shoulder: Secondary | ICD-10-CM | POA: Diagnosis not present

## 2017-05-12 DIAGNOSIS — G8110 Spastic hemiplegia affecting unspecified side: Secondary | ICD-10-CM | POA: Diagnosis not present

## 2017-05-12 DIAGNOSIS — G8194 Hemiplegia, unspecified affecting left nondominant side: Secondary | ICD-10-CM | POA: Diagnosis present

## 2017-05-12 DIAGNOSIS — M7502 Adhesive capsulitis of left shoulder: Secondary | ICD-10-CM | POA: Insufficient documentation

## 2017-05-12 DIAGNOSIS — M7542 Impingement syndrome of left shoulder: Secondary | ICD-10-CM | POA: Insufficient documentation

## 2017-05-12 DIAGNOSIS — I69398 Other sequelae of cerebral infarction: Secondary | ICD-10-CM

## 2017-05-12 DIAGNOSIS — S43002A Unspecified subluxation of left shoulder joint, initial encounter: Secondary | ICD-10-CM | POA: Insufficient documentation

## 2017-05-12 NOTE — Progress Notes (Signed)
Subjective:    Patient ID: Rebecca Yoder, female    DOB: 02-Apr-1966, 51 y.o.   MRN: [id number]  HPI  Pt states she was started on protonix but denies heartburn on reflux, she states abd pain occurs during menstrual cycle   Pain Inventory Average Pain 3 Pain Right Now 0 My pain is .  In the last 24 hours, has pain interfered with the following? General activity 5 Relation with others 7 Enjoyment of life 10 What TIME of day is your pain at its worst? daytime Sleep (in general) Good  Pain is worse with: . Pain improves with: therapy/exercise Relief from Meds: 8  Mobility ability to climb steps?  yes do you drive?  no  Function disabled: date disabled 2016  Neuro/Psych trouble walking  Prior Studies Any changes since last visit?  no  Physicians involved in your care Any changes since last visit?  no   Family History  Problem Relation Age of Onset  . Multiple myeloma Mother    Social History   Social History  . Marital status: Single    Spouse name: N/A  . Number of children: 0  . Years of education: 12th   Occupational History  . [employer]   Social History Main Topics  . Smoking status: Never Smoker  . Smokeless tobacco: Never Used  . Alcohol use No  . Drug use: No  . Sexual activity: Yes   Other Topics Concern  . Not on file   Social History Narrative  . No narrative on file   Past Surgical History:  Procedure Laterality Date  . COSMETIC SURGERY    . OVARIAN CYST REMOVAL  1990's   Past Medical History:  Diagnosis Date  . ASTHMA 12/10/2009  . CEREBROVASCULAR ACCIDENT, HX OF 12/10/2009  . Complication of anesthesia    "just can't get me up" (06/09/2013)  . Embolic stroke of right basal ganglia ([place]) 06/11/2015   with L residual weakness/hemiparesis   . HYPERLIPIDEMIA 12/10/2009  . HYPERTENSION 12/10/2009  . Stroke [facility]) 2008   denies residual on 06/09/2013  . Tendonitis of wrist, right    There were no vitals taken for this  visit.  Opioid Risk Score:   Fall Risk Score:  `1  Depression screen PHQ 2/9  Depression screen [facility] 2/9 05/04/2017 03/17/2017 09/15/2016 07/15/2016 06/23/2016 04/25/2016 03/28/2016  Decreased Interest '2 2 3 ' 0 2 0 0  Down, Depressed, Hopeless 0 0 0 0 0 0 0  PHQ - 2 Score '2 2 3 ' 0 2 0 0  Altered sleeping 0 0 2 - 2 - -  Tired, decreased energy '2 2 2 ' - 2 - -  Change in appetite 0 0 3 - 0 - -  Feeling bad or failure about yourself  0 0 0 - 0 - -  Trouble concentrating 0 0 0 - 0 - -  Moving slowly or fidgety/restless 0 3 2 - 0 - -  Suicidal thoughts 0 0 0 - 3 - -  PHQ-9 Score '4 7 12 ' - 9 - -  Difficult doing work/chores - - - - - - -     Review of Systems  Constitutional: Negative.   HENT: Negative.   Eyes: Negative.   Respiratory: Negative.   Cardiovascular: Negative.   Gastrointestinal: Negative.   Endocrine: Negative.   Genitourinary: Negative.   Musculoskeletal: Negative.   Skin: Negative.   Allergic/Immunologic: Negative.   Neurological: Negative.   Hematological: Negative.   Psychiatric/Behavioral:  Negative.   All other systems reviewed and are negative.      Objective:   Physical Exam  Constitutional: She is oriented to person, place, and time. She appears well-developed and well-nourished.  HENT:  Head: Normocephalic and atraumatic.  Eyes: Pupils are equal, round, and reactive to light. Conjunctivae and EOM are normal.  Neck: Normal range of motion.  Neurological: She is alert and oriented to person, place, and time. Gait abnormal.  Motor strength is 4 minus at the left deltoid, bicep, tricep, finger flexors and extensors, there is poor motor control and decreased fine motor movements in the hand of the left side. Left lower limb has 4 minus. Hip flexor, knee extensor, and only 3 minus, ankle dorsiflexor 2 minus to flexors and extensors.  Skin: Skin is warm and dry.  Nursing note and vitals reviewed.  Ambulation without her brace on. She does exhibit some toe curling  mainly at the flexor, hallucis longus, mild inversion during swing phase, but not during stance phase.       Assessment & Plan:  1.  Excellent results from botulinum toxin injection performed 6 weeks ago, recommend repeat injection in approximately 6 weeks from today. Will use same dosing regimen  Posterior tibialis 75 Gastroc medial 75  FHL 50 FDL 100

## 2017-05-18 ENCOUNTER — Other Ambulatory Visit: Payer: Self-pay | Admitting: Family Medicine

## 2017-05-18 DIAGNOSIS — I63511 Cerebral infarction due to unspecified occlusion or stenosis of right middle cerebral artery: Secondary | ICD-10-CM

## 2017-05-18 DIAGNOSIS — I10 Essential (primary) hypertension: Secondary | ICD-10-CM

## 2017-05-18 MED FILL — CLOPIDOGREL 75 MG TABLET: 75 | 30 days supply | Qty: 30 | Fill #11

## 2017-05-18 MED FILL — LOSARTAN POTASSIUM 100 MG T: 100 | 30 days supply | Qty: 30 | Fill #11

## 2017-06-15 MED FILL — CLOPIDOGREL 75 MG TABLET: 75 | 30 days supply | Qty: 30 | Fill #0

## 2017-06-15 MED FILL — LOSARTAN POTASSIUM 100 MG T: 100 | 30 days supply | Qty: 30 | Fill #0

## 2017-06-15 MED FILL — AMLODIPINE BESYLATE 5 MG TA: 5 | 30 days supply | Qty: 30 | Fill #3

## 2017-06-15 MED FILL — PRAVASTATIN NA 20 MG TAB: 20 | 30 days supply | Qty: 30 | Fill #0

## 2017-06-15 MED FILL — CHLORTHALIDONE 25 MG TABLET: 25 | 30 days supply | Qty: 30 | Fill #3

## 2017-06-23 ENCOUNTER — Ambulatory Visit (HOSPITAL_BASED_OUTPATIENT_CLINIC_OR_DEPARTMENT_OTHER): Payer: BLUE CROSS/BLUE SHIELD | Admitting: Physical Medicine & Rehabilitation

## 2017-06-23 ENCOUNTER — Other Ambulatory Visit: Payer: Self-pay

## 2017-06-23 ENCOUNTER — Encounter: Payer: BLUE CROSS/BLUE SHIELD | Attending: Physical Medicine & Rehabilitation

## 2017-06-23 ENCOUNTER — Encounter: Payer: Self-pay | Admitting: Physical Medicine & Rehabilitation

## 2017-06-23 VITALS — BP 124/84 | HR 85 | Resp 14

## 2017-06-23 DIAGNOSIS — M7502 Adhesive capsulitis of left shoulder: Secondary | ICD-10-CM | POA: Diagnosis not present

## 2017-06-23 DIAGNOSIS — R269 Unspecified abnormalities of gait and mobility: Secondary | ICD-10-CM | POA: Insufficient documentation

## 2017-06-23 DIAGNOSIS — G8110 Spastic hemiplegia affecting unspecified side: Secondary | ICD-10-CM

## 2017-06-23 DIAGNOSIS — M25512 Pain in left shoulder: Secondary | ICD-10-CM | POA: Insufficient documentation

## 2017-06-23 DIAGNOSIS — Z8673 Personal history of transient ischemic attack (TIA), and cerebral infarction without residual deficits: Secondary | ICD-10-CM | POA: Diagnosis present

## 2017-06-23 DIAGNOSIS — M7542 Impingement syndrome of left shoulder: Secondary | ICD-10-CM | POA: Diagnosis not present

## 2017-06-23 DIAGNOSIS — I69954 Hemiplegia and hemiparesis following unspecified cerebrovascular disease affecting left non-dominant side: Secondary | ICD-10-CM | POA: Insufficient documentation

## 2017-06-23 DIAGNOSIS — S43002A Unspecified subluxation of left shoulder joint, initial encounter: Secondary | ICD-10-CM | POA: Diagnosis not present

## 2017-06-23 DIAGNOSIS — G8194 Hemiplegia, unspecified affecting left nondominant side: Secondary | ICD-10-CM | POA: Diagnosis present

## 2017-06-23 NOTE — Patient Instructions (Signed)

## 2017-06-23 NOTE — Progress Notes (Signed)
  Botox Injection for spasticity using needle EMG guidance  Dilution: 50 Units/ml Indication: Severe spasticity which interferes with ADL,mobility and/or  hygiene and is unresponsive to medication management and other conservative care Informed consent was obtained after describing risks and benefits of the procedure with the patient. This includes bleeding, bruising, infection, excessive weakness, or medication side effects. A REMS form is on file and signed. Needle: 50mm 25g needle electrode Number of units per muscle Posterior tibialis 75 Gastroc medial 75  FHL 50 FDL 100 All injections were done after obtaining appropriate EMG activity and after negative drawback for blood. The patient tolerated the procedure well. Post procedure instructions were given. A followup appointment was made.  

## 2017-07-07 ENCOUNTER — Ambulatory Visit: Payer: BLUE CROSS/BLUE SHIELD | Attending: Family Medicine | Admitting: Family Medicine

## 2017-07-07 ENCOUNTER — Other Ambulatory Visit (HOSPITAL_COMMUNITY)
Admission: RE | Admit: 2017-07-07 | Discharge: 2017-07-07 | Disposition: A | Discharge status: Home / Self Care | Payer: BLUE CROSS/BLUE SHIELD | Source: Ambulatory Visit | Attending: Family Medicine | Admitting: Family Medicine

## 2017-07-07 ENCOUNTER — Encounter: Payer: Self-pay | Admitting: Family Medicine

## 2017-07-07 VITALS — BP 112/77 | HR 94 | Temp 98.5°F | Wt 177.0 lb

## 2017-07-07 DIAGNOSIS — B9689 Other specified bacterial agents as the cause of diseases classified elsewhere: Secondary | ICD-10-CM | POA: Diagnosis not present

## 2017-07-07 DIAGNOSIS — B373 Candidiasis of vulva and vagina: Secondary | ICD-10-CM

## 2017-07-07 DIAGNOSIS — Z880 Allergy status to penicillin: Secondary | ICD-10-CM | POA: Insufficient documentation

## 2017-07-07 DIAGNOSIS — I10 Essential (primary) hypertension: Secondary | ICD-10-CM | POA: Diagnosis not present

## 2017-07-07 DIAGNOSIS — Z8673 Personal history of transient ischemic attack (TIA), and cerebral infarction without residual deficits: Secondary | ICD-10-CM | POA: Diagnosis not present

## 2017-07-07 DIAGNOSIS — Z0001 Encounter for general adult medical examination with abnormal findings: Secondary | ICD-10-CM | POA: Insufficient documentation

## 2017-07-07 DIAGNOSIS — Z1239 Encounter for other screening for malignant neoplasm of breast: Secondary | ICD-10-CM

## 2017-07-07 DIAGNOSIS — Z1231 Encounter for screening mammogram for malignant neoplasm of breast: Secondary | ICD-10-CM

## 2017-07-07 DIAGNOSIS — Z79899 Other long term (current) drug therapy: Secondary | ICD-10-CM | POA: Insufficient documentation

## 2017-07-07 DIAGNOSIS — E119 Type 2 diabetes mellitus without complications: Secondary | ICD-10-CM | POA: Diagnosis not present

## 2017-07-07 DIAGNOSIS — E785 Hyperlipidemia, unspecified: Secondary | ICD-10-CM | POA: Insufficient documentation

## 2017-07-07 DIAGNOSIS — Z0000 Encounter for general adult medical examination without abnormal findings: Secondary | ICD-10-CM

## 2017-07-07 DIAGNOSIS — B3731 Acute candidiasis of vulva and vagina: Secondary | ICD-10-CM

## 2017-07-07 DIAGNOSIS — Z124 Encounter for screening for malignant neoplasm of cervix: Secondary | ICD-10-CM | POA: Diagnosis not present

## 2017-07-07 DIAGNOSIS — N760 Acute vaginitis: Secondary | ICD-10-CM | POA: Diagnosis not present

## 2017-07-07 LAB — GLUCOSE, POCT (MANUAL RESULT ENTRY): POC GLUCOSE: 103 mg/dL — AB (ref 70–99)

## 2017-07-07 LAB — POCT GLYCOSYLATED HEMOGLOBIN (HGB A1C): HEMOGLOBIN A1C: 6.1

## 2017-07-07 MED ORDER — FLUCONAZOLE 150 MG PO TABS: 150.0000 mg | ORAL_TABLET | Freq: Once | ORAL | 0 refills | Status: AC

## 2017-07-07 MED ORDER — METRONIDAZOLE 0.75 % VA GEL: 1.0000 | Freq: Every day | VAGINAL | 0 refills | Status: DC

## 2017-07-07 NOTE — Patient Instructions (Signed)

## 2017-07-07 NOTE — Progress Notes (Signed)
Subjective:  Patient ID: Rebecca Yoder, female    DOB: 08/15/1965  Age: 51 y.o. MRN: [id number]  CC: Annual Exam and Diabetes   HPI [name] presents for a complete physical exam.  Past Medical History:  Diagnosis Date  . ASTHMA 12/10/2009  . CEREBROVASCULAR ACCIDENT, HX OF 12/10/2009  . Complication of anesthesia    "just can't get me up" (06/09/2013)  . Embolic stroke of right basal ganglia ([place]) 06/11/2015   with L residual weakness/hemiparesis   . HYPERLIPIDEMIA 12/10/2009  . HYPERTENSION 12/10/2009  . Stroke [facility]) 2008   denies residual on 06/09/2013  . Tendonitis of wrist, right     Past Surgical History:  Procedure Laterality Date  . COSMETIC SURGERY    . OVARIAN CYST REMOVAL  1990's    Allergies  Allergen Reactions  . Penicillins Rash     Outpatient Medications Prior to Visit  Medication Sig Dispense Refill  . acetaminophen (TYLENOL) 325 MG tablet Take 2 tablets (650 mg total) by mouth every 6 (six) hours as needed. 20 tablet 0  . albuterol (PROAIR HFA) 108 (90 Base) MCG/ACT inhaler INHALE 2 PUFFS INTO THE LUNGS EVERY 6 HOURS AS NEEDED FOR WHEEZING. 8.5 g 1  . amLODipine (NORVASC) 5 MG tablet Take 1 tablet (5 mg total) by mouth daily. 30 tablet 6  . baclofen (LIORESAL) 10 MG tablet Take 1 tablet (10 mg total) by mouth 2 (two) times daily as needed for muscle spasms. 60 tablet 3  . chlorthalidone (HYGROTON) 25 MG tablet Take 1 tablet (25 mg total) by mouth daily. 30 tablet 5  . clopidogrel (PLAVIX) 75 MG tablet Take 1 tablet (75 mg total) by mouth daily. 30 tablet 6  . diphenhydrAMINE (BENADRYL) 25 MG tablet Take 50 mg by mouth at bedtime.    . fluticasone (FLOVENT HFA) 44 MCG/ACT inhaler Inhale 2 puffs into the lungs 2 (two) times daily. 1 Inhaler 5  . ibuprofen (ADVIL,MOTRIN) 600 MG tablet Take 1 tablet (600 mg total) by mouth every 8 (eight) hours as needed. For painful menses 30 tablet 0  . losartan (COZAAR) 100 MG tablet Take 1 tablet (100 mg total) by  mouth daily. 30 tablet 6  . Multiple Vitamin (MULTIVITAMIN WITH MINERALS) TABS tablet Take 1 tablet by mouth daily.    . pantoprazole (PROTONIX) 40 MG tablet TAKE 1 TABLET BY MOUTH prn 30 tablet 6  . pravastatin (PRAVACHOL) 20 MG tablet Take 1 tablet (20 mg total) by mouth daily. 30 tablet 11  . triamcinolone ointment (KENALOG) 0.5 % Apply 1 application topically 2 (two) times daily. (Patient taking differently: Apply 1 application topically 2 (two) times daily as needed. ) 30 g 0   No facility-administered medications prior to visit.     ROS Review of Systems  Constitutional: Negative for activity change, appetite change and fatigue.  HENT: Negative for congestion, sinus pressure and sore throat.   Eyes: Negative for visual disturbance.  Respiratory: Negative for cough, chest tightness, shortness of breath and wheezing.   Cardiovascular: Negative for chest pain and palpitations.  Gastrointestinal: Negative for abdominal distention, abdominal pain and constipation.  Endocrine: Negative for polydipsia.  Genitourinary: Negative for dysuria and frequency.  Musculoskeletal: Negative for arthralgias and back pain.  Skin: Negative for rash.  Neurological: Positive for weakness. Negative for tremors, light-headedness and numbness.  Hematological: Does not bruise/bleed easily.  Psychiatric/Behavioral: Negative for agitation and behavioral problems.    Objective:  BP 112/77   Pulse 94  Temp 98.5 F (36.9 C) (Oral)   Wt 177 lb (80.3 kg)   SpO2 97%   BMI 30.38 kg/m   BP/Weight 07/07/2017 06/23/2017 66/12/9933  Systolic BP 701 779 390  Diastolic BP 77 84 85  Wt. (Lbs) 177 - -  BMI 30.38 - -      Physical Exam  Constitutional: She is oriented to person, place, and time. She appears well-developed and well-nourished. No distress.  HENT:  Head: Normocephalic.  Right Ear: External ear normal.  Left Ear: External ear normal.  Nose: Nose normal.  Mouth/Throat: Oropharynx is clear  and moist.  Eyes: Conjunctivae and EOM are normal. Pupils are equal, round, and reactive to light.  Neck: Normal range of motion. No JVD present.  Cardiovascular: Normal rate, regular rhythm, normal heart sounds and intact distal pulses. Exam reveals no gallop.  No murmur heard. Pulmonary/Chest: Effort normal and breath sounds normal. No respiratory distress. She has no wheezes. She has no rales. She exhibits no tenderness. Right breast exhibits no mass and no tenderness. Left breast exhibits no mass and no tenderness.  Abdominal: Soft. Bowel sounds are normal. She exhibits no distension and no mass. There is no tenderness.  Genitourinary:  Genitourinary Comments: Normal external genitalia. Cheeselike vaginal discharge with fishy odor from the vagina Cervix is normal  Musculoskeletal: Normal range of motion. She exhibits no edema or tenderness.  L spastic hemiparesis  Neurological: She is alert and oriented to person, place, and time. She has normal reflexes.  Skin: Skin is warm and dry. She is not diaphoretic.  Psychiatric: She has a normal mood and affect.    Lab Results  Component Value Date   HGBA1C 6.1 07/07/2017     Assessment & Plan:   1. Annual physical exam Counseled on exercise as tolerated as this will be limited by her left hemiparesis, healthy eating, routine healthcare maintenance. - Cytology - PAP [place]  2. Controlled type 2 diabetes mellitus without complication, without long-term current use of insulin (HCC) Controlled with A1c of 6.1 - POCT glucose (manual entry) - POCT glycosylated hemoglobin (Hb A1C)  3. Screening for breast cancer - MM Digital Screening; Future  4. Screening for cervical cancer   5. Vaginal candidiasis - fluconazole (DIFLUCAN) 150 MG tablet; Take 1 tablet (150 mg total) by mouth once for 1 dose.  Dispense: 1 tablet; Refill: 0  6. Bacterial vaginosis - metroNIDAZOLE (METROGEL VAGINAL) 0.75 % vaginal gel; Place 1 Applicatorful  vaginally at bedtime.  Dispense: 70 g; Refill: 0   Meds ordered this encounter  Medications  . metroNIDAZOLE (METROGEL VAGINAL) 0.75 % vaginal gel    Sig: Place 1 Applicatorful vaginally at bedtime.    Dispense:  70 g    Refill:  0  . fluconazole (DIFLUCAN) 150 MG tablet    Sig: Take 1 tablet (150 mg total) by mouth once for 1 dose.    Dispense:  1 tablet    Refill:  0    Follow-up: Return in about 3 months (around 10/07/2017) for follow up of Diabetes .   Arnoldo Morale MD

## 2017-07-13 LAB — CYTOLOGY - PAP
Diagnosis: NEGATIVE
HPV: NOT DETECTED

## 2017-07-13 MED FILL — CHLORTHALIDONE 25 MG TABLET: 25 | 30 days supply | Qty: 30 | Fill #4

## 2017-07-13 MED FILL — PRAVASTATIN NA 20 MG TAB: 20 | 30 days supply | Qty: 30 | Fill #1

## 2017-07-13 MED FILL — AMLODIPINE BESYLATE 5 MG TA: 5 | 30 days supply | Qty: 30 | Fill #4

## 2017-07-13 MED FILL — VENTOLIN HFA 90 MCG INHALER: 108 (90 BAS | 25 days supply | Qty: 18 | Fill #0

## 2017-07-14 MED FILL — VANDAZOLE VAGINAL 0.75% GEL: 0.75 | 5 days supply | Qty: 70 | Fill #0

## 2017-07-14 MED FILL — FLUCONAZOLE 150 MG TABLET: 150 | 1 days supply | Qty: 1 | Fill #0

## 2017-07-20 MED FILL — LOSARTAN POTASSIUM 100 MG T: 100 | 30 days supply | Qty: 30 | Fill #1

## 2017-07-20 MED FILL — CLOPIDOGREL 75 MG TABLET: 75 | 30 days supply | Qty: 30 | Fill #1

## 2017-07-21 ENCOUNTER — Ambulatory Visit: Payer: BLUE CROSS/BLUE SHIELD | Admitting: Family Medicine

## 2017-08-07 ENCOUNTER — Ambulatory Visit: Payer: BLUE CROSS/BLUE SHIELD | Admitting: Physical Medicine & Rehabilitation

## 2017-08-07 ENCOUNTER — Encounter: Payer: BLUE CROSS/BLUE SHIELD | Attending: Physical Medicine & Rehabilitation

## 2017-08-07 ENCOUNTER — Encounter: Payer: Self-pay | Admitting: Physical Medicine & Rehabilitation

## 2017-08-07 VITALS — BP 125/86 | HR 107 | Resp 14

## 2017-08-07 DIAGNOSIS — M25512 Pain in left shoulder: Secondary | ICD-10-CM | POA: Insufficient documentation

## 2017-08-07 DIAGNOSIS — M7542 Impingement syndrome of left shoulder: Secondary | ICD-10-CM | POA: Diagnosis not present

## 2017-08-07 DIAGNOSIS — I69954 Hemiplegia and hemiparesis following unspecified cerebrovascular disease affecting left non-dominant side: Secondary | ICD-10-CM | POA: Diagnosis not present

## 2017-08-07 DIAGNOSIS — G8110 Spastic hemiplegia affecting unspecified side: Secondary | ICD-10-CM | POA: Diagnosis not present

## 2017-08-07 DIAGNOSIS — Z8673 Personal history of transient ischemic attack (TIA), and cerebral infarction without residual deficits: Secondary | ICD-10-CM | POA: Diagnosis present

## 2017-08-07 DIAGNOSIS — S43002A Unspecified subluxation of left shoulder joint, initial encounter: Secondary | ICD-10-CM | POA: Insufficient documentation

## 2017-08-07 DIAGNOSIS — R269 Unspecified abnormalities of gait and mobility: Secondary | ICD-10-CM | POA: Insufficient documentation

## 2017-08-07 DIAGNOSIS — G8194 Hemiplegia, unspecified affecting left nondominant side: Secondary | ICD-10-CM | POA: Diagnosis present

## 2017-08-07 DIAGNOSIS — I69398 Other sequelae of cerebral infarction: Secondary | ICD-10-CM | POA: Diagnosis not present

## 2017-08-07 DIAGNOSIS — M7502 Adhesive capsulitis of left shoulder: Secondary | ICD-10-CM | POA: Insufficient documentation

## 2017-08-07 NOTE — Patient Instructions (Signed)
Botox next visit  

## 2017-08-07 NOTE — Progress Notes (Signed)
Subjective:    Patient ID: Rebecca Yoder, female    DOB: June 13, 1966, 51 y.o.   MRN: [id number]  HPI 11/13 Botox injection Left Posterior tibialis 75 Gastroc medial 75  FHL 50 FDL 100  Not using oral antispasticity meds  Patient overall feels like her left foot has relaxed.  She is ambulating with a cane and left AFO.  She has limited ambulation distance to about 300 feet.  No falls at home.  No new medical issues.  Left hand still doing fairly well in terms of tone Pain Inventory Average Pain 0 Pain Right Now 0 My pain is no pain  In the last 24 hours, has pain interfered with the following? General activity 0 Relation with others 0 Enjoyment of life 0 What TIME of day is your pain at its worst? no pain Sleep (in general) Good  Pain is worse with: no pain Pain improves with: no pain Relief from Meds: no pain  Mobility walk with assistance use a cane use a walker how many minutes can you walk? 30 ability to climb steps?  no do you drive?  no Do you have any goals in this area?  yes  Function disabled: date disabled .  Neuro/Psych trouble walking  Prior Studies Any changes since last visit?  no  Physicians involved in your care Any changes since last visit?  no   Family History  Problem Relation Age of Onset  . Multiple myeloma Mother    Social History   Socioeconomic History  . Marital status: Single    Spouse name: None  . Number of children: 0  . Years of education: 12th  . Highest education level: None  Social Needs  . Financial resource strain: None  . Food insecurity - worry: None  . Food insecurity - inability: None  . Transportation needs - medical: None  . Transportation needs - non-medical: None  Occupational History  . Occupation: budd    Fish farm manager: [employer]  Tobacco Use  . Smoking status: Never Smoker  . Smokeless tobacco: Never Used  Substance and Sexual Activity  . Alcohol use: No    Alcohol/week: 0.0 oz  . Drug  use: No  . Sexual activity: Yes  Other Topics Concern  . None  Social History Narrative  . None   Past Surgical History:  Procedure Laterality Date  . COSMETIC SURGERY    . OVARIAN CYST REMOVAL  1990's   Past Medical History:  Diagnosis Date  . ASTHMA 12/10/2009  . CEREBROVASCULAR ACCIDENT, HX OF 12/10/2009  . Complication of anesthesia    "just can't get me up" (06/09/2013)  . Embolic stroke of right basal ganglia ([place]) 06/11/2015   with L residual weakness/hemiparesis   . HYPERLIPIDEMIA 12/10/2009  . HYPERTENSION 12/10/2009  . Stroke [facility]) 2008   denies residual on 06/09/2013  . Tendonitis of wrist, right    BP 125/86 (BP Location: Right Arm, Patient Position: Sitting, Cuff Size: Normal)   Pulse (!) 107   Resp 14   SpO2 92%   Opioid Risk Score:   Fall Risk Score:  `1  Depression screen PHQ 2/9  Depression screen [facility] 2/9 07/07/2017 05/04/2017 03/17/2017 09/15/2016 07/15/2016 06/23/2016 04/25/2016  Decreased Interest _0 0 2 0  Down, Depressed, Hopeless 0 0 0 0 0 0 0  PHQ - 2 Score _1 0 2 0  Altered sleeping 0 0 0 2 - 2 -  Tired, decreased energy 3  _0 - 2 -  Change in appetite 0 0 0 3 - 0 -  Feeling bad or failure about yourself  0 0 0 0 - 0 -  Trouble concentrating 0 0 0 0 - 0 -  Moving slowly or fidgety/restless 0 0 3 2 - 0 -  Suicidal thoughts 0 0 0 0 - 3 -  PHQ-9 Score _1 - 9 -  Difficult doing work/chores - - - - - - -  Some recent data might be hidden    Review of Systems  HENT: Negative.   Eyes: Negative.   Respiratory: Negative.   Cardiovascular: Negative.   Gastrointestinal: Negative.   Endocrine: Negative.   Genitourinary: Negative.   Musculoskeletal: Positive for gait problem.  Skin: Negative.   Allergic/Immunologic: Negative.   Hematological: Negative.   Psychiatric/Behavioral: Negative.        Objective:   Physical Exam  Constitutional: She is oriented to person, place, and time. She appears well-developed and well-nourished.    HENT:  Head: Normocephalic and atraumatic.  Eyes: Conjunctivae and EOM are normal. Pupils are equal, round, and reactive to light.  Neurological: She is alert and oriented to person, place, and time.  Psychiatric: She has a normal mood and affect.  Nursing note and vitals reviewed.  Left bicep triceps finger flexors extensors wrist flexors extensors 3/5 Decreased motor control left upper extremity Tone MAS 1 left upper extremity Tone MAS 1 left ankle dorsiflexors toe flexors great toe flexor as well as foot inverted. She does have contraction of EHL FDL and posterior tibialis when standing but is able to achieve foot flat. Does have foot inversion during swing phase with her AFO off       Assessment & Plan:  1.  Left spastic hemiparesis secondary to right CVA.  Spasticity left upper extremity is no longer requiring Botox injections.  Left lower extremity has had good response to botulinum toxin injection last performed approximately 6 weeks ago.  Would continue current dosing and muscle group selection and repeat in 6 weeks.  Discussed with patient agrees with plan.

## 2017-08-17 MED FILL — AMLODIPINE BESYLATE 5 MG TA: 5 | 30 days supply | Qty: 30 | Fill #5

## 2017-08-17 MED FILL — CHLORTHALIDONE 25 MG TABLET: 25 | 30 days supply | Qty: 30 | Fill #5

## 2017-08-17 MED FILL — CLOPIDOGREL 75 MG TABLET: 75 | 30 days supply | Qty: 30 | Fill #2

## 2017-08-17 MED FILL — LOSARTAN POTASSIUM 100 MG T: 100 | 30 days supply | Qty: 30 | Fill #2

## 2017-08-17 MED FILL — PRAVASTATIN NA 20 MG TAB: 20 | 30 days supply | Qty: 30 | Fill #2

## 2017-09-14 MED FILL — AMLODIPINE BESYLATE 5 MG TA: 5 | 30 days supply | Qty: 30 | Fill #6

## 2017-09-14 MED FILL — CHLORTHALIDONE 25 MG TABLET: 25 | 30 days supply | Qty: 30 | Fill #0

## 2017-09-18 ENCOUNTER — Encounter: Payer: Self-pay | Admitting: Physical Medicine & Rehabilitation

## 2017-09-18 ENCOUNTER — Encounter: Payer: BLUE CROSS/BLUE SHIELD | Attending: Physical Medicine & Rehabilitation

## 2017-09-18 ENCOUNTER — Ambulatory Visit: Payer: BLUE CROSS/BLUE SHIELD | Admitting: Physical Medicine & Rehabilitation

## 2017-09-18 VITALS — BP 112/76 | HR 76

## 2017-09-18 DIAGNOSIS — M25512 Pain in left shoulder: Secondary | ICD-10-CM | POA: Diagnosis not present

## 2017-09-18 DIAGNOSIS — S43002A Unspecified subluxation of left shoulder joint, initial encounter: Secondary | ICD-10-CM | POA: Diagnosis not present

## 2017-09-18 DIAGNOSIS — R269 Unspecified abnormalities of gait and mobility: Secondary | ICD-10-CM | POA: Diagnosis present

## 2017-09-18 DIAGNOSIS — I69954 Hemiplegia and hemiparesis following unspecified cerebrovascular disease affecting left non-dominant side: Secondary | ICD-10-CM | POA: Insufficient documentation

## 2017-09-18 DIAGNOSIS — G8110 Spastic hemiplegia affecting unspecified side: Secondary | ICD-10-CM | POA: Diagnosis not present

## 2017-09-18 DIAGNOSIS — M7542 Impingement syndrome of left shoulder: Secondary | ICD-10-CM | POA: Diagnosis not present

## 2017-09-18 DIAGNOSIS — M7502 Adhesive capsulitis of left shoulder: Secondary | ICD-10-CM | POA: Diagnosis not present

## 2017-09-18 DIAGNOSIS — G8194 Hemiplegia, unspecified affecting left nondominant side: Secondary | ICD-10-CM | POA: Diagnosis present

## 2017-09-18 DIAGNOSIS — Z8673 Personal history of transient ischemic attack (TIA), and cerebral infarction without residual deficits: Secondary | ICD-10-CM | POA: Diagnosis present

## 2017-09-18 NOTE — Progress Notes (Signed)
  Botox Injection for spasticity using needle EMG guidance  Dilution: 50 Units/ml Indication: Severe spasticity which interferes with ADL,mobility and/or  hygiene and is unresponsive to medication management and other conservative care Informed consent was obtained after describing risks and benefits of the procedure with the patient. This includes bleeding, bruising, infection, excessive weakness, or medication side effects. A REMS form is on file and signed. Needle: 50mm 25g needle electrode Number of units per muscle Posterior tibialis 75 Gastroc medial 75  FHL 50 FDL 100 All injections were done after obtaining appropriate EMG activity and after negative drawback for blood. The patient tolerated the procedure well. Post procedure instructions were given. A followup appointment was made.  

## 2017-09-18 NOTE — Patient Instructions (Signed)

## 2017-09-21 MED FILL — CLOPIDOGREL 75 MG TABLET: 75 | 30 days supply | Qty: 30 | Fill #3

## 2017-09-21 MED FILL — LOSARTAN POTASSIUM 100 MG T: 100 | 30 days supply | Qty: 30 | Fill #3

## 2017-10-09 ENCOUNTER — Ambulatory Visit: Payer: BLUE CROSS/BLUE SHIELD | Attending: Family Medicine | Admitting: Family Medicine

## 2017-10-09 ENCOUNTER — Encounter: Payer: Self-pay | Admitting: Family Medicine

## 2017-10-09 VITALS — BP 145/81 | HR 87 | Temp 97.7°F | Wt 176.2 lb

## 2017-10-09 DIAGNOSIS — Z7902 Long term (current) use of antithrombotics/antiplatelets: Secondary | ICD-10-CM | POA: Diagnosis not present

## 2017-10-09 DIAGNOSIS — Z79899 Other long term (current) drug therapy: Secondary | ICD-10-CM | POA: Diagnosis not present

## 2017-10-09 DIAGNOSIS — G8114 Spastic hemiplegia affecting left nondominant side: Secondary | ICD-10-CM | POA: Diagnosis not present

## 2017-10-09 DIAGNOSIS — I10 Essential (primary) hypertension: Secondary | ICD-10-CM | POA: Diagnosis not present

## 2017-10-09 DIAGNOSIS — J4530 Mild persistent asthma, uncomplicated: Secondary | ICD-10-CM | POA: Diagnosis not present

## 2017-10-09 DIAGNOSIS — Z880 Allergy status to penicillin: Secondary | ICD-10-CM | POA: Insufficient documentation

## 2017-10-09 DIAGNOSIS — G8110 Spastic hemiplegia affecting unspecified side: Secondary | ICD-10-CM

## 2017-10-09 DIAGNOSIS — E785 Hyperlipidemia, unspecified: Secondary | ICD-10-CM | POA: Insufficient documentation

## 2017-10-09 DIAGNOSIS — K219 Gastro-esophageal reflux disease without esophagitis: Secondary | ICD-10-CM | POA: Diagnosis not present

## 2017-10-09 DIAGNOSIS — I2510 Atherosclerotic heart disease of native coronary artery without angina pectoris: Secondary | ICD-10-CM | POA: Diagnosis not present

## 2017-10-09 DIAGNOSIS — E119 Type 2 diabetes mellitus without complications: Secondary | ICD-10-CM | POA: Insufficient documentation

## 2017-10-09 DIAGNOSIS — Z9889 Other specified postprocedural states: Secondary | ICD-10-CM | POA: Diagnosis not present

## 2017-10-09 DIAGNOSIS — I63511 Cerebral infarction due to unspecified occlusion or stenosis of right middle cerebral artery: Secondary | ICD-10-CM | POA: Insufficient documentation

## 2017-10-09 LAB — POCT GLYCOSYLATED HEMOGLOBIN (HGB A1C): Hemoglobin A1C: 6.1

## 2017-10-09 LAB — GLUCOSE, POCT (MANUAL RESULT ENTRY): POC GLUCOSE: 79 mg/dL (ref 70–99)

## 2017-10-09 MED ORDER — LOSARTAN POTASSIUM 100 MG PO TABS: 100.0000 mg | ORAL_TABLET | Freq: Every day | ORAL | 6 refills | Status: DC

## 2017-10-09 MED ORDER — ATORVASTATIN CALCIUM 40 MG PO TABS: 40.0000 mg | ORAL_TABLET | Freq: Every day | ORAL | 5 refills | Status: DC

## 2017-10-09 MED ORDER — AMLODIPINE BESYLATE 5 MG PO TABS: 5.0000 mg | ORAL_TABLET | Freq: Every day | ORAL | 6 refills | Status: DC

## 2017-10-09 MED ORDER — METRONIDAZOLE 500 MG PO TABS: 500.0000 mg | ORAL_TABLET | Freq: Two times a day (BID) | ORAL | 0 refills | Status: DC

## 2017-10-09 MED ORDER — FLUTICASONE PROPIONATE HFA 44 MCG/ACT IN AERO: 2.0000 | INHALATION_SPRAY | Freq: Two times a day (BID) | RESPIRATORY_TRACT | 5 refills | Status: DC

## 2017-10-09 MED ORDER — CHLORTHALIDONE 25 MG PO TABS: 25.0000 mg | ORAL_TABLET | Freq: Every day | ORAL | 5 refills | Status: DC

## 2017-10-09 MED ORDER — CLOPIDOGREL BISULFATE 75 MG PO TABS: 75.0000 mg | ORAL_TABLET | Freq: Every day | ORAL | 6 refills | Status: DC

## 2017-10-09 MED ORDER — BACLOFEN 10 MG PO TABS: 10.0000 mg | ORAL_TABLET | Freq: Two times a day (BID) | ORAL | 3 refills | Status: DC | PRN

## 2017-10-09 MED FILL — metroNIDAZOLE 500 MG TABS: 500 | 7 days supply | Qty: 14 | Fill #0

## 2017-10-09 MED FILL — BACLOFEN 10 MG TABLET: 10 | 30 days supply | Qty: 60 | Fill #0

## 2017-10-09 MED FILL — CHLORTHALIDONE 25 MG TABLET: 25 | 30 days supply | Qty: 30 | Fill #0

## 2017-10-09 MED FILL — AMLODIPINE BESYLATE 5 MG TA: 5 | 30 days supply | Qty: 30 | Fill #0

## 2017-10-09 MED FILL — ATORVASTATIN 40 MG TABLET: 40 | 30 days supply | Qty: 30 | Fill #0

## 2017-10-09 MED FILL — FLOVENT HFA 44 MCG INHALER: 44 | 30 days supply | Qty: 11 | Fill #0

## 2017-10-09 NOTE — Patient Instructions (Signed)
Diabetes Mellitus and Nutrition When you have diabetes (diabetes mellitus), it is very important to have healthy eating habits because your blood sugar (glucose) levels are greatly affected by what you eat and drink. Eating healthy foods in the appropriate amounts, at about the same times every day, can help you:  Control your blood glucose.  Lower your risk of heart disease.  Improve your blood pressure.  Reach or maintain a healthy weight.  Every person with diabetes is different, and each person has different needs for a meal plan. Your health care provider may recommend that you work with a diet and nutrition specialist (dietitian) to make a meal plan that is best for you. Your meal plan may vary depending on factors such as:  The calories you need.  The medicines you take.  Your weight.  Your blood glucose, blood pressure, and cholesterol levels.  Your activity level.  Other health conditions you have, such as heart or kidney disease.  How do carbohydrates affect me? Carbohydrates affect your blood glucose level more than any other type of food. Eating carbohydrates naturally increases the amount of glucose in your blood. Carbohydrate counting is a method for keeping track of how many carbohydrates you eat. Counting carbohydrates is important to keep your blood glucose at a healthy level, especially if you use insulin or take certain oral diabetes medicines. It is important to know how many carbohydrates you can safely have in each meal. This is different for every person. Your dietitian can help you calculate how many carbohydrates you should have at each meal and for snack. Foods that contain carbohydrates include:  Bread, cereal, rice, pasta, and crackers.  Potatoes and corn.  Peas, beans, and lentils.  Milk and yogurt.  Fruit and juice.  Desserts, such as cakes, cookies, ice cream, and candy.  How does alcohol affect me? Alcohol can cause a sudden decrease in blood  glucose (hypoglycemia), especially if you use insulin or take certain oral diabetes medicines. Hypoglycemia can be a life-threatening condition. Symptoms of hypoglycemia (sleepiness, dizziness, and confusion) are similar to symptoms of having too much alcohol. If your health care provider says that alcohol is safe for you, follow these guidelines:  Limit alcohol intake to no more than 1 drink per day for nonpregnant women and 2 drinks per day for men. One drink equals 12 oz of beer, 5 oz of wine, or 1 oz of hard liquor.  Do not drink on an empty stomach.  Keep yourself hydrated with water, diet soda, or unsweetened iced tea.  Keep in mind that regular soda, juice, and other mixers may contain a lot of sugar and must be counted as carbohydrates.  What are tips for following this plan? Reading food labels  Start by checking the serving size on the label. The amount of calories, carbohydrates, fats, and other nutrients listed on the label are based on one serving of the food. Many foods contain more than one serving per package.  Check the total grams (g) of carbohydrates in one serving. You can calculate the number of servings of carbohydrates in one serving by dividing the total carbohydrates by 15. For example, if a food has 30 g of total carbohydrates, it would be equal to 2 servings of carbohydrates.  Check the number of grams (g) of saturated and trans fats in one serving. Choose foods that have low or no amount of these fats.  Check the number of milligrams (mg) of sodium in one serving. Most people   should limit total sodium intake to less than 2,300 mg per day.  Always check the nutrition information of foods labeled as "low-fat" or "nonfat". These foods may be higher in added sugar or refined carbohydrates and should be avoided.  Talk to your dietitian to identify your daily goals for nutrients listed on the label. Shopping  Avoid buying canned, premade, or processed foods. These  foods tend to be high in fat, sodium, and added sugar.  Shop around the outside edge of the grocery store. This includes fresh fruits and vegetables, bulk grains, fresh meats, and fresh dairy. Cooking  Use low-heat cooking methods, such as baking, instead of high-heat cooking methods like deep frying.  Cook using healthy oils, such as olive, canola, or sunflower oil.  Avoid cooking with butter, cream, or high-fat meats. Meal planning  Eat meals and snacks regularly, preferably at the same times every day. Avoid going long periods of time without eating.  Eat foods high in fiber, such as fresh fruits, vegetables, beans, and whole grains. Talk to your dietitian about how many servings of carbohydrates you can eat at each meal.  Eat 4-6 ounces of lean protein each day, such as lean meat, chicken, fish, eggs, or tofu. 1 ounce is equal to 1 ounce of meat, chicken, or fish, 1 egg, or 1/4 cup of tofu.  Eat some foods each day that contain healthy fats, such as avocado, nuts, seeds, and fish. Lifestyle   Check your blood glucose regularly.  Exercise at least 30 minutes 5 or more days each week, or as told by your health care provider.  Take medicines as told by your health care provider.  Do not use any products that contain nicotine or tobacco, such as cigarettes and e-cigarettes. If you need help quitting, ask your health care provider.  Work with a counselor or diabetes educator to identify strategies to manage stress and any emotional and social challenges. What are some questions to ask my health care provider?  Do I need to meet with a diabetes educator?  Do I need to meet with a dietitian?  What number can I call if I have questions?  When are the best times to check my blood glucose? Where to find more information:  American Diabetes Association: diabetes.org/food-and-fitness/food  Academy of Nutrition and Dietetics:  www.eatright.org/resources/health/diseases-and-conditions/diabetes  National Institute of Diabetes and Digestive and Kidney Diseases (NIH): www.niddk.nih.gov/health-information/diabetes/overview/diet-eating-physical-activity Summary  A healthy meal plan will help you control your blood glucose and maintain a healthy lifestyle.  Working with a diet and nutrition specialist (dietitian) can help you make a meal plan that is best for you.  Keep in mind that carbohydrates and alcohol have immediate effects on your blood glucose levels. It is important to count carbohydrates and to use alcohol carefully. This information is not intended to replace advice given to you by your health care provider. Make sure you discuss any questions you have with your health care provider. Document Released: 04/24/2005 Document Revised: 09/01/2016 Document Reviewed: 09/01/2016 Elsevier Interactive Patient Education  2018 [employer]  

## 2017-10-09 NOTE — Progress Notes (Signed)
Subjective:  Patient ID: Rebecca Yoder, female    DOB: Apr 02, 1966  Age: 52 y.o. MRN: [id number]  CC: Diabetes   HPI Rebecca Yoder  is a 52 year old female with history of type 2 diabetes mellitus (A1c 6.1),hypertension, right middle cerebral artery stroke with left spastic hemiparesis, GERD who presents today for a follow-up visit.  Her diabetes is diet controlled and she denies hypoglycemia.  Denies visual concerns; her last eye exam was 1 year ago with [facility] and she is in the process of scheduling an appointment. She has intermittent neuropathy in her hands and feet but this is mild and she would like to hold off on medications today.  She received MetroGel for treatment of bacterial vaginosis however due to left sided weakness she is unable to use the vaginal applicators and is requesting pills instead. She tolerates antihypertensive, her statin and other medications and denies adverse effects. Reflux symptoms are controlled.  With regards to her left spastic hemiparesis, she was seen by Dr. Letta Pate of physical medicine and rehab and received a Botox injection 3 weeks ago.  She also goes to the gym regularly and tries to exercise and has noticed some improvement in her muscle strength and range of motion.   Past Medical History:  Diagnosis Date  . ASTHMA 12/10/2009  . CEREBROVASCULAR ACCIDENT, HX OF 12/10/2009  . Complication of anesthesia    "just can't get me up" (06/09/2013)  . Embolic stroke of right basal ganglia ([place]) 06/11/2015   with L residual weakness/hemiparesis   . HYPERLIPIDEMIA 12/10/2009  . HYPERTENSION 12/10/2009  . Stroke [facility]) 2008   denies residual on 06/09/2013  . Tendonitis of wrist, right     Past Surgical History:  Procedure Laterality Date  . COSMETIC SURGERY    . OVARIAN CYST REMOVAL  1990's    Allergies  Allergen Reactions  . Penicillins Rash     Outpatient Medications Prior to Visit  Medication Sig Dispense Refill  .  acetaminophen (TYLENOL) 325 MG tablet Take 2 tablets (650 mg total) by mouth every 6 (six) hours as needed. 20 tablet 0  . albuterol (PROAIR HFA) 108 (90 Base) MCG/ACT inhaler INHALE 2 PUFFS INTO THE LUNGS EVERY 6 HOURS AS NEEDED FOR WHEEZING. 8.5 g 1  . diphenhydrAMINE (BENADRYL) 25 MG tablet Take 50 mg by mouth at bedtime.    Marland Kitchen ibuprofen (ADVIL,MOTRIN) 600 MG tablet Take 1 tablet (600 mg total) by mouth every 8 (eight) hours as needed. For painful menses 30 tablet 0  . Multiple Vitamin (MULTIVITAMIN WITH MINERALS) TABS tablet Take 1 tablet by mouth daily.    . pantoprazole (PROTONIX) 40 MG tablet TAKE 1 TABLET BY MOUTH prn 30 tablet 6  . triamcinolone ointment (KENALOG) 0.5 % Apply 1 application topically 2 (two) times daily. (Patient taking differently: Apply 1 application topically 2 (two) times daily as needed. ) 30 g 0  . amLODipine (NORVASC) 5 MG tablet Take 1 tablet (5 mg total) by mouth daily. 30 tablet 6  . baclofen (LIORESAL) 10 MG tablet Take 1 tablet (10 mg total) by mouth 2 (two) times daily as needed for muscle spasms. 60 tablet 3  . chlorthalidone (HYGROTON) 25 MG tablet Take 1 tablet (25 mg total) by mouth daily. 30 tablet 5  . clopidogrel (PLAVIX) 75 MG tablet Take 1 tablet (75 mg total) by mouth daily. 30 tablet 6  . fluticasone (FLOVENT HFA) 44 MCG/ACT inhaler Inhale 2 puffs into the lungs 2 (two) times daily.  1 Inhaler 5  . losartan (COZAAR) 100 MG tablet Take 1 tablet (100 mg total) by mouth daily. 30 tablet 6  . pravastatin (PRAVACHOL) 20 MG tablet Take 1 tablet (20 mg total) by mouth daily. 30 tablet 11  . metroNIDAZOLE (METROGEL VAGINAL) 0.75 % vaginal gel Place 1 Applicatorful vaginally at bedtime. (Patient not taking: Reported on 10/09/2017) 70 g 0   No facility-administered medications prior to visit.     ROS Review of Systems  Constitutional: Negative for activity change, appetite change and fatigue.  HENT: Negative for congestion, sinus pressure and sore throat.     Eyes: Negative for visual disturbance.  Respiratory: Negative for cough, chest tightness, shortness of breath and wheezing.   Cardiovascular: Negative for chest pain and palpitations.  Gastrointestinal: Negative for abdominal distention, abdominal pain and constipation.  Endocrine: Negative for polydipsia.  Genitourinary: Negative for dysuria and frequency.  Musculoskeletal: Positive for gait problem.       Left hemiparesis  Skin: Negative for rash.  Neurological: Positive for weakness. Negative for tremors, light-headedness and numbness.  Hematological: Does not bruise/bleed easily.  Psychiatric/Behavioral: Negative for agitation and behavioral problems.    Objective:  BP (!) 145/81   Pulse 87   Temp 97.7 F (36.5 C) (Oral)   Wt 176 lb 3.2 oz (79.9 kg)   SpO2 99%   BMI 30.24 kg/m   BP/Weight 10/09/2017 09/18/2017 40/05/2724  Systolic BP 366 440 347  Diastolic BP 81 76 86  Wt. (Lbs) 176.2 - -  BMI 30.24 - -      Physical Exam  Constitutional: She is oriented to person, place, and time. She appears well-developed and well-nourished.  Cardiovascular: Normal rate, normal heart sounds and intact distal pulses.  No murmur heard. Pulmonary/Chest: Effort normal and breath sounds normal. She has no wheezes. She has no rales. She exhibits no tenderness.  Abdominal: Soft. Bowel sounds are normal. She exhibits no distension and no mass. There is no tenderness.  Musculoskeletal:  Spastic left upper extremity  Neurological: She is alert and oriented to person, place, and time.  Psychiatric: She has a normal mood and affect.     Lab Results  Component Value Date   HGBA1C 6.1 10/09/2017    Assessment & Plan:   1. Controlled type 2 diabetes mellitus without complication, without long-term current use of insulin (HCC) Controlled with A1c of 6.1 Advised to schedule eye exam Pravastatin switched to atorvastatin given the former is a moderate intensity statin and she does have a  history of cardiovascular disease Counseled on Diabetic diet, my plate method, 425 minutes of moderate intensity exercise/week Keep blood sugar logs with fasting goals of 80-120 mg/dl, random of less than 180 and in the event of sugars less than 60 mg/dl or greater than 400 mg/dl please notify the clinic ASAP. It is recommended that you undergo annual eye exams and annual foot exams. Pneumonia vaccine is recommended. - POCT glucose (manual entry) - POCT glycosylated hemoglobin (Hb A1C) - CMP14+EGFR; Future - Lipid panel; Future - Microalbumin/Creatinine Ratio, Urine; Future  2. Essential hypertension Slightly elevated above goal No regimen change today as systolic blood pressures have been in the 112-124 range prior to this visit. - losartan (COZAAR) 100 MG tablet; Take 1 tablet (100 mg total) by mouth daily.  Dispense: 30 tablet; Refill: 6 - amLODipine (NORVASC) 5 MG tablet; Take 1 tablet (5 mg total) by mouth daily.  Dispense: 30 tablet; Refill: 6 - chlorthalidone (HYGROTON) 25 MG tablet; Take 1  tablet (25 mg total) by mouth daily.  Dispense: 30 tablet; Refill: 5  3. Mild persistent asthma without complication Controlled - fluticasone (FLOVENT HFA) 44 MCG/ACT inhaler; Inhale 2 puffs into the lungs 2 (two) times daily.  Dispense: 1 Inhaler; Refill: 5  4. Right middle cerebral artery stroke (HCC) Risk factor modification - clopidogrel (PLAVIX) 75 MG tablet; Take 1 tablet (75 mg total) by mouth daily.  Dispense: 30 tablet; Refill: 6  5. Spastic hemiplegia affecting nondominant side ([place]) Continue home physical therapy and commended on efforts to stay active   Meds ordered this encounter  Medications  . metroNIDAZOLE (FLAGYL) 500 MG tablet    Sig: Take 1 tablet (500 mg total) by mouth 2 (two) times daily.    Dispense:  14 tablet    Refill:  0  . losartan (COZAAR) 100 MG tablet    Sig: Take 1 tablet (100 mg total) by mouth daily.    Dispense:  30 tablet    Refill:  6  . baclofen  (LIORESAL) 10 MG tablet    Sig: Take 1 tablet (10 mg total) by mouth 2 (two) times daily as needed for muscle spasms.    Dispense:  60 tablet    Refill:  3  . fluticasone (FLOVENT HFA) 44 MCG/ACT inhaler    Sig: Inhale 2 puffs into the lungs 2 (two) times daily.    Dispense:  1 Inhaler    Refill:  5  . amLODipine (NORVASC) 5 MG tablet    Sig: Take 1 tablet (5 mg total) by mouth daily.    Dispense:  30 tablet    Refill:  6  . chlorthalidone (HYGROTON) 25 MG tablet    Sig: Take 1 tablet (25 mg total) by mouth daily.    Dispense:  30 tablet    Refill:  5  . clopidogrel (PLAVIX) 75 MG tablet    Sig: Take 1 tablet (75 mg total) by mouth daily.    Dispense:  30 tablet    Refill:  6  . atorvastatin (LIPITOR) 40 MG tablet    Sig: Take 1 tablet (40 mg total) by mouth daily.    Dispense:  30 tablet    Refill:  5    Discontinue pravastatin    Follow-up: Return in about 3 months (around 01/09/2018) for Follow-up of chronic medical conditions.   Charlott Rakes MD

## 2017-10-13 ENCOUNTER — Ambulatory Visit: Payer: BLUE CROSS/BLUE SHIELD | Attending: Family Medicine

## 2017-10-13 DIAGNOSIS — E119 Type 2 diabetes mellitus without complications: Secondary | ICD-10-CM | POA: Insufficient documentation

## 2017-10-13 NOTE — Progress Notes (Signed)
Patient here for lab visit only 

## 2017-10-14 LAB — CMP14+EGFR
A/G RATIO: 1.4 (ref 1.2–2.2)
ALBUMIN: 4.4 g/dL (ref 3.5–5.5)
ALT: 17 IU/L (ref 0–32)
AST: 17 IU/L (ref 0–40)
Alkaline Phosphatase: 68 IU/L (ref 39–117)
BUN/Creatinine Ratio: 15 (ref 9–23)
BUN: 13 mg/dL (ref 6–24)
Bilirubin Total: 0.3 mg/dL (ref 0.0–1.2)
CALCIUM: 9.8 mg/dL (ref 8.7–10.2)
CO2: 25 mmol/L (ref 20–29)
Chloride: 99 mmol/L (ref 96–106)
Creatinine, Ser: 0.85 mg/dL (ref 0.57–1.00)
GFR, EST AFRICAN AMERICAN: 93 mL/min/{1.73_m2} (ref >59)
GFR, EST NON AFRICAN AMERICAN: 81 mL/min/{1.73_m2} (ref >59)
GLUCOSE: 91 mg/dL (ref 65–99)
Globulin, Total: 3.2 g/dL (ref 1.5–4.5)
Potassium: 3.6 mmol/L (ref 3.5–5.2)
Sodium: 141 mmol/L (ref 134–144)
TOTAL PROTEIN: 7.6 g/dL (ref 6.0–8.5)

## 2017-10-14 LAB — LIPID PANEL
CHOL/HDL RATIO: 3.1 ratio (ref 0.0–4.4)
Cholesterol, Total: 148 mg/dL (ref 100–199)
HDL: 48 mg/dL (ref >39)
LDL Calculated: 71 mg/dL (ref 0–99)
TRIGLYCERIDES: 146 mg/dL (ref 0–149)
VLDL Cholesterol Cal: 29 mg/dL (ref 5–40)

## 2017-10-19 MED FILL — CLOPIDOGREL 75 MG TABLET: 75 | 30 days supply | Qty: 30 | Fill #4

## 2017-10-19 MED FILL — LOSARTAN POTASSIUM 100 MG T: 100 | 30 days supply | Qty: 30 | Fill #4

## 2017-10-30 ENCOUNTER — Ambulatory Visit: Payer: BLUE CROSS/BLUE SHIELD | Admitting: Physical Medicine & Rehabilitation

## 2017-10-30 ENCOUNTER — Ambulatory Visit: Payer: BLUE CROSS/BLUE SHIELD

## 2017-11-10 ENCOUNTER — Encounter: Payer: Self-pay | Admitting: Physical Medicine & Rehabilitation

## 2017-11-10 ENCOUNTER — Encounter: Payer: Medicare Other | Attending: Physical Medicine & Rehabilitation

## 2017-11-10 ENCOUNTER — Ambulatory Visit (HOSPITAL_BASED_OUTPATIENT_CLINIC_OR_DEPARTMENT_OTHER): Payer: Medicare Other | Admitting: Physical Medicine & Rehabilitation

## 2017-11-10 VITALS — BP 131/84 | HR 99 | Resp 14 | Ht 64.0 in | Wt 170.0 lb

## 2017-11-10 DIAGNOSIS — M7542 Impingement syndrome of left shoulder: Secondary | ICD-10-CM | POA: Insufficient documentation

## 2017-11-10 DIAGNOSIS — R269 Unspecified abnormalities of gait and mobility: Secondary | ICD-10-CM | POA: Insufficient documentation

## 2017-11-10 DIAGNOSIS — I69398 Other sequelae of cerebral infarction: Secondary | ICD-10-CM

## 2017-11-10 DIAGNOSIS — G8110 Spastic hemiplegia affecting unspecified side: Secondary | ICD-10-CM

## 2017-11-10 DIAGNOSIS — M7502 Adhesive capsulitis of left shoulder: Secondary | ICD-10-CM | POA: Insufficient documentation

## 2017-11-10 DIAGNOSIS — I69954 Hemiplegia and hemiparesis following unspecified cerebrovascular disease affecting left non-dominant side: Secondary | ICD-10-CM | POA: Insufficient documentation

## 2017-11-10 DIAGNOSIS — I63511 Cerebral infarction due to unspecified occlusion or stenosis of right middle cerebral artery: Secondary | ICD-10-CM

## 2017-11-10 DIAGNOSIS — S43002A Unspecified subluxation of left shoulder joint, initial encounter: Secondary | ICD-10-CM | POA: Diagnosis not present

## 2017-11-10 DIAGNOSIS — G8194 Hemiplegia, unspecified affecting left nondominant side: Secondary | ICD-10-CM | POA: Diagnosis present

## 2017-11-10 DIAGNOSIS — Z8673 Personal history of transient ischemic attack (TIA), and cerebral infarction without residual deficits: Secondary | ICD-10-CM | POA: Diagnosis present

## 2017-11-10 DIAGNOSIS — M25512 Pain in left shoulder: Secondary | ICD-10-CM | POA: Insufficient documentation

## 2017-11-10 NOTE — Patient Instructions (Signed)
CONTINUE EXERCISE DO CHEST PRESS IN ADDITION TO THE OTHER EXERCISES.

## 2017-11-10 NOTE — Progress Notes (Addendum)
Subjective:    Patient ID: Rebecca Yoder, female    DOB: 1966-06-05, 52 y.o.   MRN: [id number]  HPI   Working out in the [employer] showed a video of her doing Nu Step , leg presses and back row Walks up and down hall for 65mn  2/8 Botox-patient feels like her foot and ankle are looser Posterior tibialis 75 Gastroc medial 75  FHL 50 FDL 100  Pain Inventory Average Pain 0 Pain Right Now 0 My pain is sharp  In the last 24 hours, has pain interfered with the following? General activity 8 Relation with others 10 Enjoyment of life 9 What TIME of day is your pain at its worst? evening Sleep (in general) Fair  Pain is worse with: walking Pain improves with: therapy/exercise Relief from Meds: 10  Mobility walk with assistance use a cane use a walker do you drive?  no Do you have any goals in this area?  yes  Function disabled: date disabled . I need assistance with the following:  meal prep and shopping Do you have any goals in this area?  yes  Neuro/Psych trouble walking  Prior Studies Any changes since last visit?  no  Physicians involved in your care Any changes since last visit?  no   Family History  Problem Relation Age of Onset  . Multiple myeloma Mother    Social History   Socioeconomic History  . Marital status: Single    Spouse name: Not on file  . Number of children: 0  . Years of education: 12th  . Highest education level: Not on file  Occupational History  . Occupation: [employer] Needs  . Financial resource strain: Not on file  . Food insecurity:    Worry: Not on file    Inability: Not on file  . Transportation needs:    Medical: Not on file    Non-medical: Not on file  Tobacco Use  . Smoking status: Never Smoker  . Smokeless tobacco: Never Used  Substance and Sexual Activity  . Alcohol use: No    Alcohol/week: 0.0 oz  . Drug use: No  . Sexual activity: Yes  Lifestyle  . Physical activity:   Days per week: Not on file    Minutes per session: Not on file  . Stress: Not on file  Relationships  . Social connections:    Talks on phone: Not on file    Gets together: Not on file    Attends religious service: Not on file    Active member of club or organization: Not on file    Attends meetings of clubs or organizations: Not on file    Relationship status: Not on file  Other Topics Concern  . Not on file  Social History Narrative  . Not on file   Past Surgical History:  Procedure Laterality Date  . COSMETIC SURGERY    . OVARIAN CYST REMOVAL  1990's   Past Medical History:  Diagnosis Date  . ASTHMA 12/10/2009  . CEREBROVASCULAR ACCIDENT, HX OF 12/10/2009  . Complication of anesthesia    "just can't get me up" (06/09/2013)  . Embolic stroke of right basal ganglia ([place] 06/11/2015   with L residual weakness/hemiparesis   . HYPERLIPIDEMIA 12/10/2009  . HYPERTENSION 12/10/2009  . Stroke ([facility] 2008   denies residual on 06/09/2013  . Tendonitis of wrist, right    BP 131/84 (BP Location: Right Arm, Patient Position: Sitting,  Cuff Size: Normal)   Pulse 99   Resp 14   Ht _0  (1.626 m)   Wt 170 lb (77.1 kg)   SpO2 96%   BMI 29.18 kg/m   Opioid Risk Score:   Fall Risk Score:  `1  Depression screen PHQ 2/9  Depression screen [facility] 2/9 10/09/2017 07/07/2017 05/04/2017 03/17/2017 09/15/2016 07/15/2016 06/23/2016  Decreased Interest _1 0 2  Down, Depressed, Hopeless 1 0 0 0 0 0 0  PHQ - 2 Score _2 0 2  Altered sleeping 0 0 0 0 2 - 2  Tired, decreased energy 0 _3 - 2  Change in appetite 0 0 0 0 3 - 0  Feeling bad or failure about yourself  0 0 0 0 0 - 0  Trouble concentrating 0 0 0 0 0 - 0  Moving slowly or fidgety/restless 0 0 0 3 2 - 0  Suicidal thoughts 0 0 0 0 0 - 3  PHQ-9 Score _4 - 9  Difficult doing work/chores - - - - - - -  Some recent data might be hidden    Review of Systems  Constitutional: Negative.   HENT: Negative.   Eyes: Negative.     Respiratory: Negative.   Cardiovascular: Negative.   Gastrointestinal: Negative.   Endocrine: Negative.   Genitourinary: Negative.   Musculoskeletal: Positive for gait problem.  Skin: Negative.   Allergic/Immunologic: Negative.   Hematological: Negative.   Psychiatric/Behavioral: Negative.        Objective:   Physical Exam  Constitutional: She is oriented to person, place, and time. She appears well-developed and well-nourished.  HENT:  Head: Normocephalic and atraumatic.  Eyes: Pupils are equal, round, and reactive to light. Conjunctivae and EOM are normal.  Neck: Normal range of motion.  Musculoskeletal:  No pain with left upper or left lower limb range of motion.  No evidence of leg swelling  Neurological: She is alert and oriented to person, place, and time.  Motor strength is 5/5 in the right deltoid, bicep tricep grip hip flexor knee extensor ankle dorsiflexor 3- in the left deltoid bicep tricep finger flexors and extensors 4- in the left flexor extensor trace ankle dorsiflexor Tone MAS 1 at the left bicep and a S1 finger flexors and wrist flexors MAS 2 in the left ankle inverters and plantar flexors no evidence of clonus at the ankle  Skin: Skin is warm and dry.  Psychiatric: She has a normal mood and affect. Her behavior is normal. Judgment and thought content normal.  Nursing note and vitals reviewed.         Assessment & Plan:  1.  Left hemiparesis secondary to right CVA.  Improvement after botulinum toxin injection left lower extremity from 09/18/2017.  I would recommend that she continues with the same dose and muscle groups selection.  Repeat Botox in 6 wks  We discussed her workout routine.  I think she is doing appropriate exercises such as the new step as well as leg presses.  She is doing a rowing exercise but we need to add chest presses to work the antagonist muscle groups.

## 2017-11-16 MED FILL — ATORVASTATIN CALCIUM 40 MG: 40 | 30 days supply | Qty: 30 | Fill #1

## 2017-11-16 MED FILL — AMLODIPINE BESYLATE 5 MG TA: 5 | 30 days supply | Qty: 30 | Fill #1

## 2017-11-16 MED FILL — CHLORTHALIDONE 25 MG TABLET: 25 | 30 days supply | Qty: 30 | Fill #1

## 2017-11-18 MED FILL — CLOPIDOGREL 75 MG TABLET: 75 | 30 days supply | Qty: 30 | Fill #5

## 2017-11-18 MED FILL — LOSARTAN POTASSIUM 100 MG T: 100 | 30 days supply | Qty: 30 | Fill #5

## 2017-12-14 MED FILL — ATORVASTATIN CALCIUM 40 MG: 40 | 30 days supply | Qty: 30 | Fill #2

## 2017-12-14 MED FILL — CHLORTHALIDONE 25 MG TAB: 25 | 30 days supply | Qty: 30 | Fill #2

## 2017-12-14 MED FILL — AMLODIPINE BESYLATE 5 MG TA: 5 | 30 days supply | Qty: 30 | Fill #2

## 2017-12-15 ENCOUNTER — Encounter: Payer: Self-pay | Admitting: Physical Medicine & Rehabilitation

## 2017-12-15 ENCOUNTER — Ambulatory Visit (HOSPITAL_BASED_OUTPATIENT_CLINIC_OR_DEPARTMENT_OTHER): Payer: Medicare Other | Admitting: Physical Medicine & Rehabilitation

## 2017-12-15 ENCOUNTER — Encounter: Payer: Medicare Other | Attending: Physical Medicine & Rehabilitation

## 2017-12-15 DIAGNOSIS — M25512 Pain in left shoulder: Secondary | ICD-10-CM | POA: Diagnosis not present

## 2017-12-15 DIAGNOSIS — Z8673 Personal history of transient ischemic attack (TIA), and cerebral infarction without residual deficits: Secondary | ICD-10-CM | POA: Diagnosis present

## 2017-12-15 DIAGNOSIS — I69954 Hemiplegia and hemiparesis following unspecified cerebrovascular disease affecting left non-dominant side: Secondary | ICD-10-CM | POA: Insufficient documentation

## 2017-12-15 DIAGNOSIS — R269 Unspecified abnormalities of gait and mobility: Secondary | ICD-10-CM | POA: Insufficient documentation

## 2017-12-15 DIAGNOSIS — G8110 Spastic hemiplegia affecting unspecified side: Secondary | ICD-10-CM | POA: Diagnosis not present

## 2017-12-15 DIAGNOSIS — M7502 Adhesive capsulitis of left shoulder: Secondary | ICD-10-CM | POA: Diagnosis not present

## 2017-12-15 DIAGNOSIS — S43002A Unspecified subluxation of left shoulder joint, initial encounter: Secondary | ICD-10-CM | POA: Diagnosis not present

## 2017-12-15 DIAGNOSIS — M7542 Impingement syndrome of left shoulder: Secondary | ICD-10-CM | POA: Insufficient documentation

## 2017-12-15 DIAGNOSIS — G8194 Hemiplegia, unspecified affecting left nondominant side: Secondary | ICD-10-CM | POA: Diagnosis present

## 2017-12-15 NOTE — Patient Instructions (Signed)

## 2017-12-15 NOTE — Progress Notes (Signed)
  Botox Injection for spasticity using needle EMG guidance  Dilution: 50 Units/ml Indication: Severe spasticity which interferes with ADL,mobility and/or  hygiene and is unresponsive to medication management and other conservative care Informed consent was obtained after describing risks and benefits of the procedure with the patient. This includes bleeding, bruising, infection, excessive weakness, or medication side effects. A REMS form is on file and signed. Needle: 50mm 25g needle electrode Number of units per muscle Posterior tibialis 75 Gastroc medial 75  FHL 50 FDL 100 All injections were done after obtaining appropriate EMG activity and after negative drawback for blood. The patient tolerated the procedure well. Post procedure instructions were given. A followup appointment was made.  

## 2017-12-16 MED FILL — LOSARTAN POTASSIUM 100 MG T: 100 | 30 days supply | Qty: 30 | Fill #6

## 2017-12-16 MED FILL — CLOPIDOGREL 75 MG TABLET: 75 | 30 days supply | Qty: 30 | Fill #6

## 2017-12-23 ENCOUNTER — Ambulatory Visit: Payer: Medicare Other | Attending: Family Medicine | Admitting: Physician Assistant

## 2017-12-23 VITALS — BP 120/84 | HR 62 | Temp 98.2°F | Resp 16 | Ht 64.5 in | Wt 174.2 lb

## 2017-12-23 DIAGNOSIS — Z79899 Other long term (current) drug therapy: Secondary | ICD-10-CM | POA: Diagnosis not present

## 2017-12-23 DIAGNOSIS — R519 Headache, unspecified: Secondary | ICD-10-CM

## 2017-12-23 DIAGNOSIS — J45909 Unspecified asthma, uncomplicated: Secondary | ICD-10-CM | POA: Diagnosis not present

## 2017-12-23 DIAGNOSIS — Z8673 Personal history of transient ischemic attack (TIA), and cerebral infarction without residual deficits: Secondary | ICD-10-CM | POA: Insufficient documentation

## 2017-12-23 DIAGNOSIS — I63511 Cerebral infarction due to unspecified occlusion or stenosis of right middle cerebral artery: Secondary | ICD-10-CM

## 2017-12-23 DIAGNOSIS — E119 Type 2 diabetes mellitus without complications: Secondary | ICD-10-CM | POA: Diagnosis not present

## 2017-12-23 DIAGNOSIS — Z880 Allergy status to penicillin: Secondary | ICD-10-CM | POA: Insufficient documentation

## 2017-12-23 DIAGNOSIS — I10 Essential (primary) hypertension: Secondary | ICD-10-CM | POA: Diagnosis not present

## 2017-12-23 DIAGNOSIS — R51 Headache: Secondary | ICD-10-CM

## 2017-12-23 DIAGNOSIS — Z7902 Long term (current) use of antithrombotics/antiplatelets: Secondary | ICD-10-CM | POA: Insufficient documentation

## 2017-12-23 DIAGNOSIS — E785 Hyperlipidemia, unspecified: Secondary | ICD-10-CM | POA: Insufficient documentation

## 2017-12-23 DIAGNOSIS — Z7951 Long term (current) use of inhaled steroids: Secondary | ICD-10-CM | POA: Diagnosis not present

## 2017-12-23 LAB — GLUCOSE, POCT (MANUAL RESULT ENTRY): POC Glucose: 87 mg/dl (ref 70–99)

## 2017-12-23 NOTE — Patient Instructions (Signed)
Check blood pressure daily and record.  Bring your blood pressure readings to your next doctor's visit.  If your blood pressure is reading >135/85, restart the chlorthalidone.

## 2017-12-23 NOTE — Progress Notes (Signed)
Patient stated she been having headache while taking Chlorthalidone. Pt. Stated she stopped taking on Friday and no more headache after that.

## 2017-12-23 NOTE — Progress Notes (Signed)
Patient ID: Rebecca Yoder, female   DOB: 11-May-1966, 52 y.o.   MRN: [id number]   Rebecca Yoder, is a 52 y.o. female  VQQ:[id number]  [id number]  DOB - [birth year]  Subjective:  Chief Complaint and HPI: Rebecca Yoder is a 52 y.o. female here today stating that she has had 4 strokes in the past.  Since she was prescribed chlorthalidone a few months ago, she says she has been having daily HA.  She stopped the chlorthalidone 5 days ago and has not had a headache since. She has been going to the gym for and working out 1-1.5 hrs.  No new neurological deficits.  BP is good today.  She does not have a BP cuff at home but is willing to get one.    ROS:   Constitutional:  No f/c, No night sweats, No unexplained weight loss. EENT:  No vision changes, No blurry vision, No hearing changes. No mouth, throat, or ear problems.  Respiratory: No cough, No SOB Cardiac: No CP, no palpitations GI:  No abd pain, No N/V/D. GU: No Urinary s/sx Musculoskeletal: No joint pain Neuro: No headache currently, no dizziness, no motor weakness.  Skin: No rash Endocrine:  No polydipsia. No polyuria.  Psych: Denies SI/HI  No problems updated.  ALLERGIES: Allergies  Allergen Reactions  . Penicillins Rash    PAST MEDICAL HISTORY: Past Medical History:  Diagnosis Date  . ASTHMA 12/10/2009  . CEREBROVASCULAR ACCIDENT, HX OF 12/10/2009  . Complication of anesthesia    "just can't get me up" (06/09/2013)  . Embolic stroke of right basal ganglia ([place]) 06/11/2015   with L residual weakness/hemiparesis   . HYPERLIPIDEMIA 12/10/2009  . HYPERTENSION 12/10/2009  . Stroke [facility]) 2008   denies residual on 06/09/2013  . Tendonitis of wrist, right     MEDICATIONS AT HOME: Prior to Admission medications   Medication Sig Start Date End Date Taking? Authorizing Provider  acetaminophen (TYLENOL) 325 MG tablet Take 2 tablets (650 mg total) by mouth every 6 (six) hours as needed. 06/10/13  Yes Regalado, Belkys A, MD    albuterol (PROAIR HFA) 108 (90 Base) MCG/ACT inhaler INHALE 2 PUFFS INTO THE LUNGS EVERY 6 HOURS AS NEEDED FOR WHEEZING. 05/04/17  Yes Newlin, Enobong, MD  amLODipine (NORVASC) 5 MG tablet Take 1 tablet (5 mg total) by mouth daily. 10/09/17  Yes Charlott Rakes, MD  atorvastatin (LIPITOR) 40 MG tablet Take 1 tablet (40 mg total) by mouth daily. 10/09/17  Yes Charlott Rakes, MD  clopidogrel (PLAVIX) 75 MG tablet Take 1 tablet (75 mg total) by mouth daily. 10/09/17  Yes Charlott Rakes, MD  diphenhydrAMINE (BENADRYL) 25 MG tablet Take 50 mg by mouth at bedtime.   Yes [provider]  fluticasone (FLOVENT HFA) 44 MCG/ACT inhaler Inhale 2 puffs into the lungs 2 (two) times daily. 10/09/17  Yes Charlott Rakes, MD  losartan (COZAAR) 100 MG tablet Take 1 tablet (100 mg total) by mouth daily. 10/09/17  Yes Charlott Rakes, MD  Multiple Vitamin (MULTIVITAMIN WITH MINERALS) TABS tablet Take 1 tablet by mouth daily.   Yes [provider]  pantoprazole (PROTONIX) 40 MG tablet TAKE 1 TABLET BY MOUTH prn 05/04/17  Yes Newlin, Enobong, MD  baclofen (LIORESAL) 10 MG tablet Take 1 tablet (10 mg total) by mouth 2 (two) times daily as needed for muscle spasms. Patient not taking: Reported on 12/23/2017 10/09/17   Charlott Rakes, MD  chlorthalidone (HYGROTON) 25 MG tablet Take 1 tablet (25 mg total) by mouth daily.  Patient not taking: Reported on 12/23/2017 10/09/17   Charlott Rakes, MD  ibuprofen (ADVIL,MOTRIN) 600 MG tablet Take 1 tablet (600 mg total) by mouth every 8 (eight) hours as needed. For painful menses Patient not taking: Reported on 12/23/2017 09/15/16   Boykin Nearing, MD  triamcinolone ointment (KENALOG) 0.5 % Apply 1 application topically 2 (two) times daily. Patient not taking: Reported on 12/23/2017 07/31/15   Boykin Nearing, MD     Objective:  EXAM:   Vitals:   12/23/17 1108  BP: 120/84  Pulse: 62  Resp: 16  Temp: 98.2 F (36.8 C)  TempSrc: Oral  SpO2: 98%  Weight: 174 lb 3.2 oz  (79 kg)  Height: 5' 4.5" (1.638 m)    General appearance : A&OX3. NAD. Non-toxic-appearing HEENT: Atraumatic and Normocephalic.  PERRLA. EOM intact.  TM clear B. Mouth-MMM, post pharynx WNL w/o erythema, No PND. Neck: supple, no JVD. No cervical lymphadenopathy. No thyromegaly Chest/Lungs:  Breathing-non-labored, Good air entry bilaterally, breath sounds normal without rales, rhonchi, or wheezing  CVS: S1 S2 regular, no murmurs, gallops, rubs  Extremities: Bilateral Lower Ext shows no edema, both legs are warm to touch with = pulse throughout Neurology:  CN II-XII grossly intact, Non focal.  Facial symmetry is WNL and equal.  Uses a cane for ambulation Psych:  TP linear. J/I WNL. Normal speech. Appropriate eye contact and affect.  Skin:  No Rash  Data Review Lab Results  Component Value Date   HGBA1C 6.1 10/09/2017   HGBA1C 6.1 07/07/2017   HGBA1C 5.8 03/17/2017     Assessment & Plan   1. Essential hypertension BP is good today with her only taking Losartan and amlodipine.  She has an appt here in about 2 weeks.  For now, obtain BP cuff and cehck BP OOO daily.  Bring your blood pressure readings to your next doctor's visit.  If your blood pressure is reading >135/85, restart the chlorthalidone.   2. Controlled type 2 diabetes mellitus without complication, without long-term current use of insulin (HCC) Glucose is good today.  Continue current regimen - Glucose (CBG)  3. Frequent headaches Have stopped since stopping chlorthalidone.  Patient have been counseled extensively about nutrition and exercise  Return for keep [id number] appt with Dr Margarita Rana..  The patient was given clear instructions to go to ER or return to medical center if symptoms don't improve, worsen or new problems develop. The patient verbalized understanding. The patient was told to call to get lab results if they haven't heard anything in the next week.     Freeman Caldron, PA-C [facility]  and [place], [place]   12/23/2017, 1:21 PM

## 2018-01-11 ENCOUNTER — Ambulatory Visit: Payer: Medicare Other | Attending: Family Medicine | Admitting: Family Medicine

## 2018-01-11 ENCOUNTER — Encounter: Payer: Self-pay | Admitting: Family Medicine

## 2018-01-11 VITALS — BP 133/81 | HR 81 | Temp 97.3°F | Ht 64.5 in | Wt 176.0 lb

## 2018-01-11 DIAGNOSIS — Z79899 Other long term (current) drug therapy: Secondary | ICD-10-CM | POA: Diagnosis not present

## 2018-01-11 DIAGNOSIS — G8110 Spastic hemiplegia affecting unspecified side: Secondary | ICD-10-CM | POA: Insufficient documentation

## 2018-01-11 DIAGNOSIS — K219 Gastro-esophageal reflux disease without esophagitis: Secondary | ICD-10-CM | POA: Insufficient documentation

## 2018-01-11 DIAGNOSIS — Z8673 Personal history of transient ischemic attack (TIA), and cerebral infarction without residual deficits: Secondary | ICD-10-CM | POA: Insufficient documentation

## 2018-01-11 DIAGNOSIS — I10 Essential (primary) hypertension: Secondary | ICD-10-CM | POA: Diagnosis not present

## 2018-01-11 DIAGNOSIS — E119 Type 2 diabetes mellitus without complications: Secondary | ICD-10-CM | POA: Diagnosis not present

## 2018-01-11 DIAGNOSIS — I63511 Cerebral infarction due to unspecified occlusion or stenosis of right middle cerebral artery: Secondary | ICD-10-CM | POA: Diagnosis not present

## 2018-01-11 DIAGNOSIS — Z8679 Personal history of other diseases of the circulatory system: Secondary | ICD-10-CM

## 2018-01-11 DIAGNOSIS — J4530 Mild persistent asthma, uncomplicated: Secondary | ICD-10-CM | POA: Insufficient documentation

## 2018-01-11 LAB — POCT GLYCOSYLATED HEMOGLOBIN (HGB A1C): HBA1C, POC (CONTROLLED DIABETIC RANGE): 6.1 % (ref 0.0–7.0)

## 2018-01-11 LAB — GLUCOSE, POCT (MANUAL RESULT ENTRY): POC GLUCOSE: 86 mg/dL (ref 70–99)

## 2018-01-11 MED FILL — AMLODIPINE BESYLATE 5 MG TA: 5 | 30 days supply | Qty: 30 | Fill #3

## 2018-01-11 MED FILL — ATORVASTATIN CALCIUM 40 MG: 40 | 30 days supply | Qty: 30 | Fill #3

## 2018-01-11 NOTE — Progress Notes (Signed)
Subjective:  Patient ID: Rebecca Yoder, female    DOB: [birth year]  Age: 52 y.o. 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 Saw the PA at her last visit, 3 weeks ago for headaches which were associated with chlorthalidone and headaches have ceased ever since she discontinued chlorthalidone and her blood pressure is also controlled on amlodipine and losartan. Her diabetes is diet controlled.  She denies neuropathy or visual concerns and has had no hypoglycemia.  She is planning to schedule her annual eye exam when she returns from her vacation. 1 month ago she was seen by Dr. Letta Pate of rehab and received a Botox injection for her left upper extremity spasticity and she continues to exercise at the gym regularly with resulting improvement in her range of motion of her left upper extremity. Her asthma and reflux symptoms have been stable and she has no acute concerns. She is preparing to and to take a trip to [employer] and after that to the beach to celebrate her birthday.   Past Medical History:  Diagnosis Date  . ASTHMA 12/10/2009  . CEREBROVASCULAR ACCIDENT, HX OF 12/10/2009  . Complication of anesthesia    "just can't get me up" (06/09/2013)  . Embolic stroke of right basal ganglia (Rebecca Yoder) 06/11/2015   with L residual weakness/hemiparesis   . HYPERLIPIDEMIA 12/10/2009  . HYPERTENSION 12/10/2009  . Stroke [facility]) 2008   denies residual on 06/09/2013  . Tendonitis of wrist, right     Past Surgical History:  Procedure Laterality Date  . COSMETIC SURGERY    . OVARIAN CYST REMOVAL  1990's    Allergies  Allergen Reactions  . Penicillins Rash     Outpatient Medications Prior to Visit  Medication Sig Dispense Refill  . acetaminophen (TYLENOL) 325 MG tablet Take 2  tablets (650 mg total) by mouth every 6 (six) hours as needed. 20 tablet 0  . albuterol (PROAIR HFA) 108 (90 Base) MCG/ACT inhaler INHALE 2 PUFFS INTO THE LUNGS EVERY 6 HOURS AS NEEDED FOR WHEEZING. 8.5 g 1  . amLODipine (NORVASC) 5 MG tablet Take 1 tablet (5 mg total) by mouth daily. 30 tablet 6  . atorvastatin (LIPITOR) 40 MG tablet Take 1 tablet (40 mg total) by mouth daily. 30 tablet 5  . clopidogrel (PLAVIX) 75 MG tablet Take 1 tablet (75 mg total) by mouth daily. 30 tablet 6  . diphenhydrAMINE (BENADRYL) 25 MG tablet Take 50 mg by mouth at bedtime.    . fluticasone (FLOVENT HFA) 44 MCG/ACT inhaler Inhale 2 puffs into the lungs 2 (two) times daily. 1 Inhaler 5  . losartan (COZAAR) 100 MG tablet Take 1 tablet (100 mg total) by mouth daily. 30 tablet 6  . Multiple Vitamin (MULTIVITAMIN WITH MINERALS) TABS tablet Take 1 tablet by mouth daily.    . pantoprazole (PROTONIX) 40 MG tablet TAKE 1 TABLET BY MOUTH prn 30 tablet 6  . baclofen (LIORESAL) 10 MG tablet Take 1 tablet (10 mg total) by mouth 2 (two) times daily as needed for muscle spasms. (Patient not taking: Reported on 12/23/2017) 60 tablet 3  . ibuprofen (ADVIL,MOTRIN) 600 MG tablet Take 1 tablet (600 mg total) by mouth every 8 (eight) hours as needed. For painful menses (Patient not taking: Reported on 12/23/2017) 30 tablet 0  . triamcinolone  ointment (KENALOG) 0.5 % Apply 1 application topically 2 (two) times daily. (Patient not taking: Reported on 12/23/2017) 30 g 0  . chlorthalidone (HYGROTON) 25 MG tablet Take 1 tablet (25 mg total) by mouth daily. (Patient not taking: Reported on 12/23/2017) 30 tablet 5   No facility-administered medications prior to visit.     ROS Review of Systems  Constitutional: Negative for activity change, appetite change and fatigue.  HENT: Negative for congestion, sinus pressure and sore throat.   Eyes: Negative for visual disturbance.  Respiratory: Negative for cough, chest tightness, shortness of breath and  wheezing.   Cardiovascular: Negative for chest pain and palpitations.  Gastrointestinal: Negative for abdominal distention, abdominal pain and constipation.  Endocrine: Negative for polydipsia.  Genitourinary: Negative for dysuria and frequency.  Musculoskeletal: Negative for arthralgias and back pain.  Skin: Negative for rash.  Neurological: Positive for weakness. Negative for tremors, light-headedness and numbness.  Hematological: Does not bruise/bleed easily.  Psychiatric/Behavioral: Negative for agitation and behavioral problems.    Objective:  BP 133/81   Pulse 81   Temp (!) 97.3 F (36.3 C) (Oral)   Ht 5' 4.5" (1.638 m)   Wt 176 lb (79.8 kg)   SpO2 99%   BMI 29.74 kg/m   BP/Weight 01/11/2018 5/36/4680 10/11/1222  Systolic BP 825 003 704  Diastolic BP 81 84 69  Wt. (Lbs) 176 174.2 175  BMI 29.74 29.44 30.04      Physical Exam  Constitutional: She is oriented to person, place, and time. She appears well-developed and well-nourished.  Cardiovascular: Normal rate, normal heart sounds and intact distal pulses.  No murmur heard. Pulmonary/Chest: Effort normal and breath sounds normal. She has no wheezes. She has no rales. She exhibits no tenderness.  Abdominal: Soft. Bowel sounds are normal. She exhibits no distension and no mass. There is no tenderness.  Musculoskeletal:  Left spastic hemiparesis  Neurological: She is alert and oriented to person, place, and time.  Skin: Skin is warm and dry.  Psychiatric: She has a normal mood and affect.    CMP Latest Ref Rng & Units 10/13/2017 05/04/2017 09/15/2016  Glucose 65 - 99 mg/dL 91 104(H) 80  BUN 6 - 24 mg/dL 13 18 11   Creatinine 0.57 - 1.00 mg/dL 0.85 0.78 0.65  Sodium 134 - 144 mmol/L 141 139 142  Potassium 3.5 - 5.2 mmol/L 3.6 3.8 4.1  Chloride 96 - 106 mmol/L 99 96 107  CO2 20 - 29 mmol/L 25 26 25   Calcium 8.7 - 10.2 mg/dL 9.8 10.0 9.9  Total Protein 6.0 - 8.5 g/dL 7.6 7.8 7.5  Total Bilirubin 0.0 - 1.2 mg/dL 0.3 <0.2  0.3  Alkaline Phos 39 - 117 IU/L 68 65 55  AST 0 - 40 IU/L 17 16 14   ALT 0 - 32 IU/L 17 18 16     Lipid Panel     Component Value Date/Time   CHOL 148 10/13/2017 0841   TRIG 146 10/13/2017 0841   HDL 48 10/13/2017 0841   CHOLHDL 3.1 10/13/2017 0841   CHOLHDL 2.4 09/15/2016 1116   VLDL 22 09/15/2016 1116   LDLCALC 71 10/13/2017 0841     Lab Results  Component Value Date   HGBA1C 6.1 01/11/2018      Assessment & Plan:   1. Controlled type 2 diabetes mellitus without complication, without long-term current use of insulin (HCC) Controlled with A1c of 6.1 Continue diabetic diet - POCT glucose (manual entry) - POCT glycosylated hemoglobin (Hb A1C)  2. Spastic  hemiplegia affecting nondominant side (HCC) Uses baclofen as needed Continue exercise regimen of the gym as tolerated  3. History of cardiovascular disorder With left hemiparesis Continue Plavix Risk factor modification  4. Essential hypertension Controlled on amlodipine and losartan Doing well without chlorthalidone which caused headaches-I have discontinued chlorthalidone  5. Mild persistent asthma without complication Controlled on Flovent   No orders of the defined types were placed in this encounter.   Follow-up: Return in about 3 months (around 04/13/2018) for follow up of chronic medical conditions.   Charlott Rakes MD

## 2018-01-18 MED FILL — CLOPIDOGREL 75 MG TABLET: 75 | 30 days supply | Qty: 30 | Fill #0

## 2018-01-18 MED FILL — VENTOLIN HFA 90 MCG INHALER: 108 (90 BAS | 25 days supply | Qty: 18 | Fill #1

## 2018-01-18 MED FILL — LOSARTAN POTASSIUM 100 MG T: 100 | 30 days supply | Qty: 30 | Fill #0

## 2018-01-26 ENCOUNTER — Ambulatory Visit (HOSPITAL_BASED_OUTPATIENT_CLINIC_OR_DEPARTMENT_OTHER): Payer: Medicare Other | Admitting: Physical Medicine & Rehabilitation

## 2018-01-26 ENCOUNTER — Other Ambulatory Visit: Payer: Self-pay

## 2018-01-26 ENCOUNTER — Encounter: Payer: Medicare Other | Attending: Physical Medicine & Rehabilitation

## 2018-01-26 ENCOUNTER — Encounter: Payer: Self-pay | Admitting: Physical Medicine & Rehabilitation

## 2018-01-26 VITALS — BP 146/91 | HR 83 | Ht 65.0 in | Wt 177.6 lb

## 2018-01-26 DIAGNOSIS — S43002A Unspecified subluxation of left shoulder joint, initial encounter: Secondary | ICD-10-CM | POA: Insufficient documentation

## 2018-01-26 DIAGNOSIS — X58XXXA Exposure to other specified factors, initial encounter: Secondary | ICD-10-CM | POA: Diagnosis not present

## 2018-01-26 DIAGNOSIS — I63511 Cerebral infarction due to unspecified occlusion or stenosis of right middle cerebral artery: Secondary | ICD-10-CM

## 2018-01-26 DIAGNOSIS — M7542 Impingement syndrome of left shoulder: Secondary | ICD-10-CM | POA: Insufficient documentation

## 2018-01-26 DIAGNOSIS — I69398 Other sequelae of cerebral infarction: Secondary | ICD-10-CM

## 2018-01-26 DIAGNOSIS — I10 Essential (primary) hypertension: Secondary | ICD-10-CM | POA: Insufficient documentation

## 2018-01-26 DIAGNOSIS — M7502 Adhesive capsulitis of left shoulder: Secondary | ICD-10-CM | POA: Insufficient documentation

## 2018-01-26 DIAGNOSIS — M25512 Pain in left shoulder: Secondary | ICD-10-CM | POA: Diagnosis not present

## 2018-01-26 DIAGNOSIS — R269 Unspecified abnormalities of gait and mobility: Secondary | ICD-10-CM | POA: Diagnosis not present

## 2018-01-26 DIAGNOSIS — I69954 Hemiplegia and hemiparesis following unspecified cerebrovascular disease affecting left non-dominant side: Secondary | ICD-10-CM | POA: Insufficient documentation

## 2018-01-26 DIAGNOSIS — G8110 Spastic hemiplegia affecting unspecified side: Secondary | ICD-10-CM

## 2018-01-26 DIAGNOSIS — Z8673 Personal history of transient ischemic attack (TIA), and cerebral infarction without residual deficits: Secondary | ICD-10-CM | POA: Insufficient documentation

## 2018-01-26 DIAGNOSIS — E785 Hyperlipidemia, unspecified: Secondary | ICD-10-CM | POA: Diagnosis not present

## 2018-01-26 DIAGNOSIS — G8194 Hemiplegia, unspecified affecting left nondominant side: Secondary | ICD-10-CM | POA: Diagnosis present

## 2018-01-26 DIAGNOSIS — Z79899 Other long term (current) drug therapy: Secondary | ICD-10-CM | POA: Diagnosis not present

## 2018-01-26 NOTE — Patient Instructions (Signed)
Botox next visit  

## 2018-01-26 NOTE — Progress Notes (Signed)
Subjective:    Patient ID: Rebecca Yoder, female    DOB: 1965/09/30, 52 y.o.   MRN: [id number]  HPI 52 year old female with history of right frontal infarct prior to 2014 as well as right basal ganglia infarct 06/11/2015 Pleased with results from botulinum toxin injection, feels like her left foot is not turning in as much.  Her nephew also has mentioned that to her.  Going to [employer] next week  Botox on 5/6 Posterior tibialis 75 Gastroc medial 75  FHL 50 FDL 100  Goes to [employer] and goes 5 laps around track Uses rowing machine Uses squeeze ball  Pain Inventory Average Pain 0 Pain Right Now 0 My pain is sharp  In the last 24 hours, has pain interfered with the following? General activity 8 Relation with others 10 Enjoyment of life 10 What TIME of day is your pain at its worst? night Sleep (in general) Good  Pain is worse with: other Pain improves with: therapy/exercise Relief from Meds: 0  Mobility use a cane ability to climb steps?  yes do you drive?  no  Function disabled: date disabled . I need assistance with the following:  household duties  Neuro/Psych weakness trouble walking  Prior Studies Any changes since last visit?  no  Physicians involved in your care Any changes since last visit?  no   Family History  Problem Relation Age of Onset  . Multiple myeloma Mother    Social History   Socioeconomic History  . Marital status: Single    Spouse name: Not on file  . Number of children: 0  . Years of education: 12th  . Highest education level: Not on file  Occupational History  . Occupation: [employer]    Fish farm manager: [employer]  Social Needs  . Financial resource strain: Not on file  . Food insecurity:    Worry: Not on file    Inability: Not on file  . Transportation needs:    Medical: Not on file    Non-medical: Not on file  Tobacco Use  . Smoking status: Never Smoker  . Smokeless tobacco: Never Used  Substance and Sexual  Activity  . Alcohol use: No    Alcohol/week: 0.0 oz  . Drug use: No  . Sexual activity: Yes  Lifestyle  . Physical activity:    Days per week: Not on file    Minutes per session: Not on file  . Stress: Not on file  Relationships  . Social connections:    Talks on phone: Not on file    Gets together: Not on file    Attends religious service: Not on file    Active member of club or organization: Not on file    Attends meetings of clubs or organizations: Not on file    Relationship status: Not on file  Other Topics Concern  . Not on file  Social History Narrative  . Not on file   Past Surgical History:  Procedure Laterality Date  . COSMETIC SURGERY    . OVARIAN CYST REMOVAL  1990's   Past Medical History:  Diagnosis Date  . ASTHMA 12/10/2009  . CEREBROVASCULAR ACCIDENT, HX OF 12/10/2009  . Complication of anesthesia    "just can't get me up" (06/09/2013)  . Embolic stroke of right basal ganglia ([place]) 06/11/2015   with L residual weakness/hemiparesis   . HYPERLIPIDEMIA 12/10/2009  . HYPERTENSION 12/10/2009  . Stroke [facility]) 2008   denies residual on 06/09/2013  . Tendonitis of  wrist, right    BP (!) 146/91   Pulse 83   Ht _0  (1.651 m)   Wt 177 lb 9.6 oz (80.6 kg)   SpO2 97%   BMI 29.55 kg/m   Opioid Risk Score:   Fall Risk Score:  `1  Depression screen PHQ 2/9  Depression screen [facility] 2/9 01/26/2018 12/23/2017 10/09/2017 07/07/2017 05/04/2017 03/17/2017 09/15/2016  Decreased Interest _1 Down, Depressed, Hopeless 1 0 1 0 0 0 0  PHQ - 2 Score _2 Altered sleeping - 2 0 0 0 0 2  Tired, decreased energy - 3 0 _3 Change in appetite - 3 0 0 0 0 3  Feeling bad or failure about yourself  - 0 0 0 0 0 0  Trouble concentrating - 2 0 0 0 0 0  Moving slowly or fidgety/restless - 2 0 0 0 3 2  Suicidal thoughts - 0 0 0 0 0 0  PHQ-9 Score - _4 Some recent data might be hidden    Review of Systems  Constitutional: Negative.   HENT: Negative.     Eyes: Negative.   Respiratory: Negative.   Cardiovascular: Negative.   Gastrointestinal: Negative.   Endocrine: Negative.   Genitourinary: Negative.   Musculoskeletal: Positive for gait problem.  Skin: Negative.   Allergic/Immunologic: Negative.   Neurological: Positive for weakness.  Hematological: Negative.   Psychiatric/Behavioral: Negative.   All other systems reviewed and are negative.      Objective:   Physical Exam  Constitutional: She is oriented to person, place, and time. She appears well-developed and well-nourished.  HENT:  Head: Normocephalic and atraumatic.  Eyes: Pupils are equal, round, and reactive to light. EOM are normal.  Neck: Normal range of motion. Neck supple.  Neurological: She is alert and oriented to person, place, and time.  Psychiatric: She has a normal mood and affect. Her behavior is normal.  Nursing note and vitals reviewed. Left lower extremity Tib post tone mildly elevated inversion MAS 1 tibialis posterior, MAS 1 at the FHL and FDL Motor strength 4- knee extensor trace ankle dorsiflexor, Tr EHL   Left upper extremity  4- left deltoid, bicep, tricep, grip Patient has neglect of the left upper extremity.  Right upper extremity and right lower extremity are 5/5     Assessment & Plan:  1.  L spastic hemiparesis secondary to  Right basal ganglia infarct 06/11/2015.  She has improvements in her toe flexor and inverter tone in the foot.  6 weeks post injection, will repeat same dose and same muscle group selection.

## 2018-02-15 MED FILL — AMLODIPINE BESYLATE 5 MG TA: 5 | 30 days supply | Qty: 30 | Fill #4

## 2018-02-15 MED FILL — ATORVASTATIN CALCIUM 40 MG: 40 | 30 days supply | Qty: 30 | Fill #4

## 2018-02-22 MED FILL — LOSARTAN POTASSIUM 100 MG T: 100 | 30 days supply | Qty: 30 | Fill #1

## 2018-02-22 MED FILL — CLOPIDOGREL 75 MG TABLET: 75 | 30 days supply | Qty: 30 | Fill #1

## 2018-03-15 MED FILL — ATORVASTATIN CALCIUM 40 MG: 40 | 30 days supply | Qty: 30 | Fill #5

## 2018-03-15 MED FILL — AMLODIPINE BESYLATE 5 MG TA: 5 | 30 days supply | Qty: 30 | Fill #5

## 2018-03-16 ENCOUNTER — Encounter: Payer: Medicare HMO | Attending: Physical Medicine & Rehabilitation

## 2018-03-16 ENCOUNTER — Telehealth: Payer: Self-pay | Admitting: Physical Medicine & Rehabilitation

## 2018-03-16 ENCOUNTER — Encounter: Payer: Self-pay | Admitting: Physical Medicine & Rehabilitation

## 2018-03-16 ENCOUNTER — Ambulatory Visit: Payer: Medicare HMO | Admitting: Physical Medicine & Rehabilitation

## 2018-03-16 VITALS — BP 137/91 | HR 80 | Ht 65.0 in | Wt 180.0 lb

## 2018-03-16 DIAGNOSIS — M7502 Adhesive capsulitis of left shoulder: Secondary | ICD-10-CM | POA: Insufficient documentation

## 2018-03-16 DIAGNOSIS — S43002A Unspecified subluxation of left shoulder joint, initial encounter: Secondary | ICD-10-CM | POA: Diagnosis not present

## 2018-03-16 DIAGNOSIS — I10 Essential (primary) hypertension: Secondary | ICD-10-CM | POA: Diagnosis not present

## 2018-03-16 DIAGNOSIS — G8110 Spastic hemiplegia affecting unspecified side: Secondary | ICD-10-CM

## 2018-03-16 DIAGNOSIS — Z8673 Personal history of transient ischemic attack (TIA), and cerebral infarction without residual deficits: Secondary | ICD-10-CM | POA: Diagnosis present

## 2018-03-16 DIAGNOSIS — E785 Hyperlipidemia, unspecified: Secondary | ICD-10-CM | POA: Diagnosis not present

## 2018-03-16 DIAGNOSIS — M25512 Pain in left shoulder: Secondary | ICD-10-CM | POA: Diagnosis not present

## 2018-03-16 DIAGNOSIS — R269 Unspecified abnormalities of gait and mobility: Secondary | ICD-10-CM | POA: Insufficient documentation

## 2018-03-16 DIAGNOSIS — I69354 Hemiplegia and hemiparesis following cerebral infarction affecting left non-dominant side: Secondary | ICD-10-CM | POA: Insufficient documentation

## 2018-03-16 DIAGNOSIS — M7542 Impingement syndrome of left shoulder: Secondary | ICD-10-CM | POA: Diagnosis not present

## 2018-03-16 DIAGNOSIS — X58XXXA Exposure to other specified factors, initial encounter: Secondary | ICD-10-CM | POA: Diagnosis not present

## 2018-03-16 DIAGNOSIS — Z79899 Other long term (current) drug therapy: Secondary | ICD-10-CM | POA: Insufficient documentation

## 2018-03-16 DIAGNOSIS — G8194 Hemiplegia, unspecified affecting left nondominant side: Secondary | ICD-10-CM | POA: Diagnosis present

## 2018-03-16 NOTE — Telephone Encounter (Signed)
error 

## 2018-03-16 NOTE — Progress Notes (Signed)
  Botox Injection for spasticity using needle EMG guidance  Dilution: 50 Units/ml Indication: Severe spasticity which interferes with ADL,mobility and/or  hygiene and is unresponsive to medication management and other conservative care Informed consent was obtained after describing risks and benefits of the procedure with the patient. This includes bleeding, bruising, infection, excessive weakness, or medication side effects. A REMS form is on file and signed. Needle: 50mm 25g needle electrode Number of units per muscle Posterior tibialis 75 Gastroc medial 75  FHL 50 FDL 100 All injections were done after obtaining appropriate EMG activity and after negative drawback for blood. The patient tolerated the procedure well. Post procedure instructions were given. A followup appointment was made.  

## 2018-03-16 NOTE — Patient Instructions (Signed)

## 2018-03-22 MED FILL — CLOPIDOGREL 75 MG TABLET: 75 | 30 days supply | Qty: 30 | Fill #2

## 2018-03-22 MED FILL — LOSARTAN POTASSIUM 100 MG T: 100 | 30 days supply | Qty: 30 | Fill #2

## 2018-03-23 ENCOUNTER — Ambulatory Visit
Admission: RE | Admit: 2018-03-23 | Discharge: 2018-03-23 | Disposition: A | Discharge status: Home / Self Care | Payer: Medicare HMO | Source: Ambulatory Visit | Attending: Family Medicine | Admitting: Family Medicine

## 2018-03-23 DIAGNOSIS — Z1239 Encounter for other screening for malignant neoplasm of breast: Secondary | ICD-10-CM

## 2018-03-23 DIAGNOSIS — Z1231 Encounter for screening mammogram for malignant neoplasm of breast: Secondary | ICD-10-CM | POA: Diagnosis not present

## 2018-04-13 ENCOUNTER — Encounter: Payer: Self-pay | Admitting: Family Medicine

## 2018-04-13 ENCOUNTER — Ambulatory Visit: Payer: Medicare HMO | Attending: Family Medicine | Admitting: Family Medicine

## 2018-04-13 ENCOUNTER — Other Ambulatory Visit: Payer: Self-pay | Admitting: Family Medicine

## 2018-04-13 VITALS — BP 132/86 | HR 114 | Temp 97.4°F | Ht 65.0 in | Wt 183.6 lb

## 2018-04-13 DIAGNOSIS — I69354 Hemiplegia and hemiparesis following cerebral infarction affecting left non-dominant side: Secondary | ICD-10-CM | POA: Insufficient documentation

## 2018-04-13 DIAGNOSIS — Z1211 Encounter for screening for malignant neoplasm of colon: Secondary | ICD-10-CM | POA: Diagnosis not present

## 2018-04-13 DIAGNOSIS — Z7902 Long term (current) use of antithrombotics/antiplatelets: Secondary | ICD-10-CM | POA: Insufficient documentation

## 2018-04-13 DIAGNOSIS — K219 Gastro-esophageal reflux disease without esophagitis: Secondary | ICD-10-CM | POA: Diagnosis not present

## 2018-04-13 DIAGNOSIS — J45909 Unspecified asthma, uncomplicated: Secondary | ICD-10-CM | POA: Insufficient documentation

## 2018-04-13 DIAGNOSIS — I10 Essential (primary) hypertension: Secondary | ICD-10-CM

## 2018-04-13 DIAGNOSIS — Z79899 Other long term (current) drug therapy: Secondary | ICD-10-CM | POA: Insufficient documentation

## 2018-04-13 DIAGNOSIS — I63511 Cerebral infarction due to unspecified occlusion or stenosis of right middle cerebral artery: Secondary | ICD-10-CM

## 2018-04-13 DIAGNOSIS — E119 Type 2 diabetes mellitus without complications: Secondary | ICD-10-CM | POA: Diagnosis not present

## 2018-04-13 DIAGNOSIS — E785 Hyperlipidemia, unspecified: Secondary | ICD-10-CM | POA: Insufficient documentation

## 2018-04-13 DIAGNOSIS — Z7951 Long term (current) use of inhaled steroids: Secondary | ICD-10-CM | POA: Insufficient documentation

## 2018-04-13 DIAGNOSIS — G8194 Hemiplegia, unspecified affecting left nondominant side: Secondary | ICD-10-CM

## 2018-04-13 LAB — POCT GLYCOSYLATED HEMOGLOBIN (HGB A1C): HbA1c, POC (controlled diabetic range): 6 % (ref 0.0–7.0)

## 2018-04-13 LAB — GLUCOSE, POCT (MANUAL RESULT ENTRY): POC GLUCOSE: 74 mg/dL (ref 70–99)

## 2018-04-13 MED ORDER — ATORVASTATIN CALCIUM 40 MG PO TABS: 40.0000 mg | ORAL_TABLET | Freq: Every day | ORAL | 5 refills | Status: DC

## 2018-04-13 MED ORDER — BACLOFEN 10 MG PO TABS: 10.0000 mg | ORAL_TABLET | Freq: Two times a day (BID) | ORAL | 6 refills | Status: DC | PRN

## 2018-04-13 MED ORDER — ALBUTEROL SULFATE HFA 108 (90 BASE) MCG/ACT IN AERS: INHALATION_SPRAY | RESPIRATORY_TRACT | 1 refills | Status: DC

## 2018-04-13 MED ORDER — AMLODIPINE BESYLATE 5 MG PO TABS: 5.0000 mg | ORAL_TABLET | Freq: Every day | ORAL | 6 refills | Status: DC

## 2018-04-13 MED ORDER — CLOPIDOGREL BISULFATE 75 MG PO TABS: 75.0000 mg | ORAL_TABLET | Freq: Every day | ORAL | 6 refills | Status: DC

## 2018-04-13 MED ORDER — LOSARTAN POTASSIUM 100 MG PO TABS: 100.0000 mg | ORAL_TABLET | Freq: Every day | ORAL | 6 refills | Status: DC

## 2018-04-13 MED FILL — AMLODIPINE BESYLATE 5 MG TA: 5 | 30 days supply | Qty: 30 | Fill #0

## 2018-04-13 MED FILL — ATORVASTATIN 40 MG TABLET: 40 | 30 days supply | Qty: 30 | Fill #0

## 2018-04-13 NOTE — Progress Notes (Signed)
Subjective:  Patient ID: Rebecca Yoder, female    DOB: March 21, 1966  Age: 52 y.o. MRN: [id number]  CC: Diabetes   HPI Rebecca Yoder is a 52 year old female with history of type 2 diabetes mellitus (A1c 6.0),hypertension, right middle cerebral artery stroke with left spastic hemiparesis, GERD, asthma who presents today for a follow-up visit. One week ago she had a visit to Dr. Letta Pate of rehab and physical medicine where she received a Botox injection.  She has been consistent with an exercise regimen and is on her way to the gym at this time. With regards to her diabetes mellitus, she denies hypoglycemia, visual concerns or numbness in extremities.  She is compliant with her antihypertensive and doing well on her statin with no complaints of myalgias or other adverse effects. Asthma symptoms and reflux have been stable with no recent exacerbations.  She rarely needs to use her Flovent inhaler and uses her MDI sparingly. She denies acute concerns today.  Past Medical History:  Diagnosis Date  . ASTHMA 12/10/2009  . CEREBROVASCULAR ACCIDENT, HX OF 12/10/2009  . Complication of anesthesia    "just can't get me up" (06/09/2013)  . Embolic stroke of right basal ganglia ([place]) 06/11/2015   with L residual weakness/hemiparesis   . HYPERLIPIDEMIA 12/10/2009  . HYPERTENSION 12/10/2009  . Stroke [facility]) 2008   denies residual on 06/09/2013  . Tendonitis of wrist, right     Past Surgical History:  Procedure Laterality Date  . COSMETIC SURGERY    . OVARIAN CYST REMOVAL  1990's    Allergies  Allergen Reactions  . Penicillins Rash     Outpatient Medications Prior to Visit  Medication Sig Dispense Refill  . acetaminophen (TYLENOL) 325 MG tablet Take 2 tablets (650 mg total) by mouth every 6 (six) hours as needed. 20 tablet 0  . diphenhydrAMINE (BENADRYL) 25 MG tablet Take 50 mg by mouth at bedtime.    . fluticasone (FLOVENT HFA) 44 MCG/ACT inhaler Inhale 2 puffs into the lungs 2 (two) times  daily. 1 Inhaler 5  . ibuprofen (ADVIL,MOTRIN) 600 MG tablet Take 1 tablet (600 mg total) by mouth every 8 (eight) hours as needed. For painful menses 30 tablet 0  . Multiple Vitamin (MULTIVITAMIN WITH MINERALS) TABS tablet Take 1 tablet by mouth daily.    . pantoprazole (PROTONIX) 40 MG tablet TAKE 1 TABLET BY MOUTH prn 30 tablet 6  . triamcinolone ointment (KENALOG) 0.5 % Apply 1 application topically 2 (two) times daily. 30 g 0  . albuterol (PROAIR HFA) 108 (90 Base) MCG/ACT inhaler INHALE 2 PUFFS INTO THE LUNGS EVERY 6 HOURS AS NEEDED FOR WHEEZING. 8.5 g 1  . amLODipine (NORVASC) 5 MG tablet Take 1 tablet (5 mg total) by mouth daily. 30 tablet 6  . atorvastatin (LIPITOR) 40 MG tablet Take 1 tablet (40 mg total) by mouth daily. 30 tablet 5  . baclofen (LIORESAL) 10 MG tablet Take 1 tablet (10 mg total) by mouth 2 (two) times daily as needed for muscle spasms. 60 tablet 3  . clopidogrel (PLAVIX) 75 MG tablet Take 1 tablet (75 mg total) by mouth daily. 30 tablet 6  . losartan (COZAAR) 100 MG tablet Take 1 tablet (100 mg total) by mouth daily. 30 tablet 6   No facility-administered medications prior to visit.     ROS Review of Systems  Constitutional: Negative for activity change, appetite change and fatigue.  HENT: Negative for congestion, sinus pressure and sore throat.   Eyes: Negative  for visual disturbance.  Respiratory: Negative for cough, chest tightness, shortness of breath and wheezing.   Cardiovascular: Negative for chest pain and palpitations.  Gastrointestinal: Negative for abdominal distention, abdominal pain and constipation.  Endocrine: Negative for polydipsia.  Genitourinary: Negative for dysuria and frequency.  Musculoskeletal: Positive for gait problem. Negative for arthralgias and back pain.  Skin: Negative for rash.  Neurological: Positive for weakness. Negative for tremors, light-headedness and numbness.  Hematological: Does not bruise/bleed easily.    Psychiatric/Behavioral: Negative for agitation and behavioral problems.    Objective:  BP 132/86   Pulse (!) 114   Temp (!) 97.4 F (36.3 C) (Oral)   Ht 5\' 5"  (1.651 m)   Wt 183 lb 9.6 oz (83.3 kg)   SpO2 96%   BMI 30.55 kg/m   BP/Weight 04/13/2018 03/16/2018 2/95/6213  Systolic BP 086 578 469  Diastolic BP 86 91 91  Wt. (Lbs) 183.6 180 177.6  BMI 30.55 29.95 29.55      Physical Exam  Constitutional: She is oriented to person, place, and time. She appears well-developed and well-nourished.  Cardiovascular: Normal rate, normal heart sounds and intact distal pulses.  No murmur heard. Pulmonary/Chest: Effort normal and breath sounds normal. She has no wheezes. She has no rales. She exhibits no tenderness.  Abdominal: Soft. Bowel sounds are normal. She exhibits no distension and no mass. There is no tenderness.  Musculoskeletal:  L Spastic hemiparesis  Neurological: She is alert and oriented to person, place, and time.  Skin: Skin is warm and dry.  Psychiatric: She has a normal mood and affect.     CMP Latest Ref Rng & Units 10/13/2017 05/04/2017 09/15/2016  Glucose 65 - 99 mg/dL 91 104(H) 80  BUN 6 - 24 mg/dL 13 18 11   Creatinine 0.57 - 1.00 mg/dL 0.85 0.78 0.65  Sodium 134 - 144 mmol/L 141 139 142  Potassium 3.5 - 5.2 mmol/L 3.6 3.8 4.1  Chloride 96 - 106 mmol/L 99 96 107  CO2 20 - 29 mmol/L 25 26 25   Calcium 8.7 - 10.2 mg/dL 9.8 10.0 9.9  Total Protein 6.0 - 8.5 g/dL 7.6 7.8 7.5  Total Bilirubin 0.0 - 1.2 mg/dL 0.3 <0.2 0.3  Alkaline Phos 39 - 117 IU/L 68 65 55  AST 0 - 40 IU/L 17 16 14   ALT 0 - 32 IU/L 17 18 16     Lipid Panel     Component Value Date/Time   CHOL 148 10/13/2017 0841   TRIG 146 10/13/2017 0841   HDL 48 10/13/2017 0841   CHOLHDL 3.1 10/13/2017 0841   CHOLHDL 2.4 09/15/2016 1116   VLDL 22 09/15/2016 1116   LDLCALC 71 10/13/2017 0841    Lab Results  Component Value Date   HGBA1C 6.0 04/13/2018    Assessment & Plan:   1. Controlled type 2  diabetes mellitus without complication, without long-term current use of insulin (HCC) Controlled with A1c of 6.0 Counseled on Diabetic diet, my plate method, 629 minutes of moderate intensity exercise/week Keep blood sugar logs with fasting goals of 80-120 mg/dl, random of less than 180 and in the event of sugars less than 60 mg/dl or greater than 400 mg/dl please notify the clinic ASAP. It is recommended that you undergo annual eye exams and annual foot exams. Pneumonia vaccine is recommended. - POCT glucose (manual entry) - POCT glycosylated hemoglobin (Hb A1C) - atorvastatin (LIPITOR) 40 MG tablet; Take 1 tablet (40 mg total) by mouth daily.  Dispense: 30 tablet; Refill: 5  2. Essential hypertension Controlled - losartan (COZAAR) 100 MG tablet; Take 1 tablet (100 mg total) by mouth daily.  Dispense: 30 tablet; Refill: 6 - amLODipine (NORVASC) 5 MG tablet; Take 1 tablet (5 mg total) by mouth daily.  Dispense: 30 tablet; Refill: 6  3. Right middle cerebral artery stroke (HCC) With left hemiparesis Risk factor modification - clopidogrel (PLAVIX) 75 MG tablet; Take 1 tablet (75 mg total) by mouth daily.  Dispense: 30 tablet; Refill: 6  4. Screening for colon cancer - Ambulatory referral to Gastroenterology  5. Left hemiparesis (HCC) Status post Botox injection Continue physical therapy - baclofen (LIORESAL) 10 MG tablet; Take 1 tablet (10 mg total) by mouth 2 (two) times daily as needed for muscle spasms.  Dispense: 60 tablet; Refill: 6   Meds ordered this encounter  Medications  . losartan (COZAAR) 100 MG tablet    Sig: Take 1 tablet (100 mg total) by mouth daily.    Dispense:  30 tablet    Refill:  6  . clopidogrel (PLAVIX) 75 MG tablet    Sig: Take 1 tablet (75 mg total) by mouth daily.    Dispense:  30 tablet    Refill:  6  . baclofen (LIORESAL) 10 MG tablet    Sig: Take 1 tablet (10 mg total) by mouth 2 (two) times daily as needed for muscle spasms.    Dispense:  60  tablet    Refill:  6  . atorvastatin (LIPITOR) 40 MG tablet    Sig: Take 1 tablet (40 mg total) by mouth daily.    Dispense:  30 tablet    Refill:  5  . amLODipine (NORVASC) 5 MG tablet    Sig: Take 1 tablet (5 mg total) by mouth daily.    Dispense:  30 tablet    Refill:  6  . albuterol (PROAIR HFA) 108 (90 Base) MCG/ACT inhaler    Sig: INHALE 2 PUFFS INTO THE LUNGS EVERY 6 HOURS AS NEEDED FOR WHEEZING.    Dispense:  8.5 g    Refill:  1    Follow-up: Return in about 6 months (around 10/12/2018) for Follow-up of chronic medical conditions.   Charlott Rakes MD

## 2018-04-13 NOTE — Patient Instructions (Signed)
Colonoscopy, Adult  A colonoscopy is an exam to look at the large intestine. It is done to check for problems, such as:  · Lumps (tumors).  · Growths (polyps).  · Swelling (inflammation).  · Bleeding.    What happens before the procedure?  Eating and drinking   Follow instructions from your doctor about eating and drinking. These instructions may include:  · A few days before the procedure - follow a low-fiber diet.  ? Avoid nuts.  ? Avoid seeds.  ? Avoid dried fruit.  ? Avoid raw fruits.  ? Avoid vegetables.  · 1-3 days before the procedure - follow a clear liquid diet. Avoid liquids that have red or purple dye. Drink only clear liquids, such as:  ? Clear broth or bouillon.  ? Black coffee or tea.  ? Clear juice.  ? Clear soft drinks or sports drinks.  ? Gelatin dessert.  ? Popsicles.  · On the day of the procedure - do not eat or drink anything during the 2 hours before the procedure.    Bowel prep   If you were prescribed an oral bowel prep:  · Take it as told by your doctor. Starting the day before your procedure, you will need to drink a lot of liquid. The liquid will cause you to poop (have bowel movements) until your poop is almost clear or light green.  · If your skin or butt gets irritated from diarrhea, you may:  ? Wipe the area with wipes that have medicine in them, such as adult wet wipes with aloe and vitamin E.  ? Put something on your skin that soothes the area, such as petroleum jelly.  · If you throw up (vomit) while drinking the bowel prep, take a break for up to 60 minutes. Then begin the bowel prep again. If you keep throwing up and you cannot take the bowel prep without throwing up, call your doctor.    General instructions   · Ask your doctor about changing or stopping your normal medicines. This is important if you take diabetes medicines or blood thinners.  · Plan to have someone take you home from the hospital or clinic.  What happens during the procedure?  · An IV tube may be put into one  of your veins.  · You will be given medicine to help you relax (sedative).  · To reduce your risk of infection:  ? Your doctors will wash their hands.  ? Your anal area will be washed with soap.  · You will be asked to lie on your side with your knees bent.  · Your doctor will get a long, thin, flexible tube ready. The tube will have a camera and a light on the end.  · The tube will be put into your anus.  · The tube will be gently put into your large intestine.  · Air will be delivered into your large intestine to keep it open. You may feel some pressure or cramping.  · The camera will be used to take photos.  · A small tissue sample may be removed from your body to be looked at under a microscope (biopsy). If any possible problems are found, the tissue will be sent to a lab for testing.  · If small growths are found, your doctor may remove them and have them checked for cancer.  · The tube that was put into your anus will be slowly removed.  The procedure may vary among doctors   and hospitals.  What happens after the procedure?  · Your doctor will check on you often until the medicines you were given have worn off.  · Do not drive for 24 hours after the procedure.  · You may have a small amount of blood in your poop.  · You may pass gas.  · You may have mild cramps or bloating in your belly (abdomen).  · It is up to you to get the results of your procedure. Ask your doctor, or the department performing the procedure, when your results will be ready.  This information is not intended to replace advice given to you by your health care provider. Make sure you discuss any questions you have with your health care provider.  Document Released: 08/30/2010 Document Revised: 05/28/2016 Document Reviewed: 10/09/2015  Elsevier Interactive Patient Education © 2017 Elsevier Inc.

## 2018-04-23 MED FILL — CLOPIDOGREL 75 MG TABLET: 75 | 30 days supply | Qty: 30 | Fill #3

## 2018-04-26 MED FILL — LOSARTAN POTASSIUM 100 MG T: 100 | 30 days supply | Qty: 30 | Fill #3

## 2018-04-27 ENCOUNTER — Ambulatory Visit: Payer: Medicare Other

## 2018-04-27 ENCOUNTER — Encounter: Payer: Medicare HMO | Attending: Physical Medicine & Rehabilitation

## 2018-04-27 ENCOUNTER — Encounter: Payer: Self-pay | Admitting: Physical Medicine & Rehabilitation

## 2018-04-27 ENCOUNTER — Ambulatory Visit: Payer: Medicare Other | Admitting: Physical Medicine & Rehabilitation

## 2018-04-27 ENCOUNTER — Ambulatory Visit (HOSPITAL_BASED_OUTPATIENT_CLINIC_OR_DEPARTMENT_OTHER): Payer: Medicare HMO | Admitting: Physical Medicine & Rehabilitation

## 2018-04-27 VITALS — BP 135/86 | HR 84 | Resp 14 | Ht 66.0 in | Wt 185.0 lb

## 2018-04-27 DIAGNOSIS — I69354 Hemiplegia and hemiparesis following cerebral infarction affecting left non-dominant side: Secondary | ICD-10-CM | POA: Insufficient documentation

## 2018-04-27 DIAGNOSIS — R269 Unspecified abnormalities of gait and mobility: Secondary | ICD-10-CM | POA: Insufficient documentation

## 2018-04-27 DIAGNOSIS — E785 Hyperlipidemia, unspecified: Secondary | ICD-10-CM | POA: Diagnosis not present

## 2018-04-27 DIAGNOSIS — M7502 Adhesive capsulitis of left shoulder: Secondary | ICD-10-CM | POA: Insufficient documentation

## 2018-04-27 DIAGNOSIS — I69398 Other sequelae of cerebral infarction: Secondary | ICD-10-CM

## 2018-04-27 DIAGNOSIS — G8110 Spastic hemiplegia affecting unspecified side: Secondary | ICD-10-CM | POA: Diagnosis not present

## 2018-04-27 DIAGNOSIS — M7542 Impingement syndrome of left shoulder: Secondary | ICD-10-CM | POA: Diagnosis not present

## 2018-04-27 DIAGNOSIS — G8194 Hemiplegia, unspecified affecting left nondominant side: Secondary | ICD-10-CM | POA: Diagnosis present

## 2018-04-27 DIAGNOSIS — S43002A Unspecified subluxation of left shoulder joint, initial encounter: Secondary | ICD-10-CM | POA: Insufficient documentation

## 2018-04-27 DIAGNOSIS — Z79899 Other long term (current) drug therapy: Secondary | ICD-10-CM | POA: Insufficient documentation

## 2018-04-27 DIAGNOSIS — M25512 Pain in left shoulder: Secondary | ICD-10-CM | POA: Diagnosis not present

## 2018-04-27 DIAGNOSIS — I10 Essential (primary) hypertension: Secondary | ICD-10-CM | POA: Diagnosis not present

## 2018-04-27 DIAGNOSIS — Z8673 Personal history of transient ischemic attack (TIA), and cerebral infarction without residual deficits: Secondary | ICD-10-CM | POA: Diagnosis present

## 2018-04-27 NOTE — Progress Notes (Signed)
Subjective:    Patient ID: Rebecca Yoder, female    DOB: [birth year], 52 y.o.   MRN: [id number] 52 year old right-handed female with a history of hypertension, previous right brain CVA 2014, remote right frontal insular infarct 2008 with known PFO maintained on aspirin.   Presented June 11, 2015 with sudden onset of left-sided weakness, slurred speech, right gaze preference.  Cranial CT scan negative.  The patient did receive tPA.  CT of the head and neck showed occluded M1 segment of the right middle cerebral artery that appeared to have progressed since prior MR angiogram.  MRI of the brain, June 13, 2015 showed progressive nonhemorrhagic infarct involving the right basal ganglia.  Remote anterior right MCA territory infarct involving the insular cortex. Echocardiogram with ejection fraction of 70% HPI  Remains on Plavix for CVA prophyllaxis  Remains on baclofen 51m TID  for spasticity BOTOX inj 03/16/18 Posterior tibialis 75 Gastroc medial 75  FHL 50 FDL 100  Improvement in toe curling noted vs prior injection , no change in dosing  Pain Inventory Average Pain 0 Pain Right Now 0 My pain is sharp  In the last 24 hours, has pain interfered with the following? General activity 3 Relation with others 9 Enjoyment of life 8 What TIME of day is your pain at its worst? night Sleep (in general) Good  Pain is worse with: other Pain improves with: rest and therapy/exercise Relief from Meds: 10  Mobility walk with assistance use a cane ability to climb steps?  no do you drive?  no Do you have any goals in this area?  yes  Function disabled: date disabled . Do you have any goals in this area?  yes  Neuro/Psych trouble walking  Prior Studies Any changes since last visit?  no  Physicians involved in your care Any changes since last visit?  no   Family History  Problem Relation Age of Onset  . Multiple myeloma Mother    Social History    Socioeconomic History  . Marital status: Single    Spouse name: Not on file  . Number of children: 0  . Years of education: 12th  . Highest education level: Not on file  Occupational History  . Occupation: [employer]    [employer] Social Needs  . Financial resource strain: Not on file  . Food insecurity:    Worry: Not on file    Inability: Not on file  . Transportation needs:    Medical: Not on file    Non-medical: Not on file  Tobacco Use  . Smoking status: Never Smoker  . Smokeless tobacco: Never Used  Substance and Sexual Activity  . Alcohol use: No    Alcohol/week: 0.0 standard drinks  . Drug use: No  . Sexual activity: Yes  Lifestyle  . Physical activity:    Days per week: Not on file    Minutes per session: Not on file  . Stress: Not on file  Relationships  . Social connections:    Talks on phone: Not on file    Gets together: Not on file    Attends religious service: Not on file    Active member of club or organization: Not on file    Attends meetings of clubs or organizations: Not on file    Relationship status: Not on file  Other Topics Concern  . Not on file  Social History Narrative  . Not on file   Past Surgical History:  Procedure  Laterality Date  . COSMETIC SURGERY    . OVARIAN CYST REMOVAL  1990's   Past Medical History:  Diagnosis Date  . ASTHMA 12/10/2009  . CEREBROVASCULAR ACCIDENT, HX OF 12/10/2009  . Complication of anesthesia    "just can't get me up" (06/09/2013)  . Embolic stroke of right basal ganglia ([place]) 06/11/2015   with L residual weakness/hemiparesis   . HYPERLIPIDEMIA 12/10/2009  . HYPERTENSION 12/10/2009  . Stroke [facility]) 2008   denies residual on 06/09/2013  . Tendonitis of wrist, right    BP 135/86   Pulse 84   Resp 14   Ht _0  (1.676 m)   Wt 185 lb (83.9 kg)   SpO2 96%   BMI 29.86 kg/m   Opioid Risk Score:   Fall Risk Score:  `1  Depression screen PHQ 2/9  Depression screen [facility] 2/9 04/13/2018 01/26/2018  12/23/2017 10/09/2017 07/07/2017 05/04/2017 03/17/2017  Decreased Interest _1 Down, Depressed, Hopeless 0 1 0 1 0 0 0  PHQ - 2 Score _2 Altered sleeping 3 - 2 0 0 0 0  Tired, decreased energy 2 - 3 0 _3 Change in appetite 0 - 3 0 0 0 0  Feeling bad or failure about yourself  0 - 0 0 0 0 0  Trouble concentrating 0 - 2 0 0 0 0  Moving slowly or fidgety/restless 0 - 2 0 0 0 3  Suicidal thoughts 0 - 0 0 0 0 0  PHQ-9 Score 8 - _4 Some recent data might be hidden    Review of Systems  Constitutional: Negative.   HENT: Negative.   Eyes: Negative.   Respiratory: Negative.   Cardiovascular: Negative.   Gastrointestinal: Negative.   Endocrine: Negative.   Genitourinary: Negative.   Musculoskeletal: Positive for gait problem.  Skin: Negative.   Allergic/Immunologic: Negative.   Psychiatric/Behavioral: Negative.   All other systems reviewed and are negative.      Objective:   Physical Exam  Constitutional: She is oriented to person, place, and time. She appears well-developed and well-nourished.  HENT:  Head: Normocephalic and atraumatic.  Eyes: Pupils are equal, round, and reactive to light.  Neurological: She is alert and oriented to person, place, and time. She has normal strength. She displays a negative Romberg sign.  Skin: Skin is warm and dry.  Psychiatric: She has a normal mood and affect.  Nursing note and vitals reviewed.   MAS 1 Left toe flexors, foot invertors MAS 2 in plantar flexors  Motor is 4- Left deltoid , biceps, triceps, finger flex and ext L HF, KE, trace ankle plantar flexor and toe flexors, 0 ankle DF  Gait short distance without can or AFO, minimal foot inversion or toe curling  Poor heel strike, foot plantigrade  No clonus at ankle      Assessment & Plan:  1.  Left spastic hemiparesis  Right MCA distribution CVA ~30yr post.  Spasticity interfering with gait but adequately controlled on combination of baclofen 157m TID and Botox injection  Will cont current dosing, repeat in 6 wks

## 2018-04-27 NOTE — Patient Instructions (Addendum)
Will cont same Botox dose

## 2018-05-17 MED FILL — AMLODIPINE BESYLATE 5 MG TA: 5 | 30 days supply | Qty: 30 | Fill #1

## 2018-05-17 MED FILL — ATORVASTATIN 40 MG TABLET: 40 | 30 days supply | Qty: 30 | Fill #1

## 2018-05-21 MED FILL — CLOPIDOGREL 75 MG TABLET: 75 | 30 days supply | Qty: 30 | Fill #4

## 2018-05-21 MED FILL — LOSARTAN POTASSIUM 100 MG T: 100 | 30 days supply | Qty: 30 | Fill #4

## 2018-06-08 ENCOUNTER — Ambulatory Visit: Payer: Medicare HMO | Admitting: Physical Medicine & Rehabilitation

## 2018-06-14 ENCOUNTER — Ambulatory Visit: Payer: Medicare HMO | Admitting: Physical Medicine & Rehabilitation

## 2018-06-14 ENCOUNTER — Encounter: Payer: Medicare HMO | Attending: Physical Medicine & Rehabilitation

## 2018-06-14 ENCOUNTER — Encounter: Payer: Self-pay | Admitting: Physical Medicine & Rehabilitation

## 2018-06-14 VITALS — BP 132/78 | HR 88 | Ht 65.0 in | Wt 190.0 lb

## 2018-06-14 DIAGNOSIS — Z79899 Other long term (current) drug therapy: Secondary | ICD-10-CM | POA: Insufficient documentation

## 2018-06-14 DIAGNOSIS — M7542 Impingement syndrome of left shoulder: Secondary | ICD-10-CM | POA: Insufficient documentation

## 2018-06-14 DIAGNOSIS — M25512 Pain in left shoulder: Secondary | ICD-10-CM | POA: Diagnosis not present

## 2018-06-14 DIAGNOSIS — I69354 Hemiplegia and hemiparesis following cerebral infarction affecting left non-dominant side: Secondary | ICD-10-CM | POA: Diagnosis not present

## 2018-06-14 DIAGNOSIS — M7502 Adhesive capsulitis of left shoulder: Secondary | ICD-10-CM | POA: Diagnosis not present

## 2018-06-14 DIAGNOSIS — R269 Unspecified abnormalities of gait and mobility: Secondary | ICD-10-CM | POA: Insufficient documentation

## 2018-06-14 DIAGNOSIS — G8110 Spastic hemiplegia affecting unspecified side: Secondary | ICD-10-CM | POA: Diagnosis not present

## 2018-06-14 DIAGNOSIS — Z8673 Personal history of transient ischemic attack (TIA), and cerebral infarction without residual deficits: Secondary | ICD-10-CM | POA: Diagnosis present

## 2018-06-14 DIAGNOSIS — I10 Essential (primary) hypertension: Secondary | ICD-10-CM | POA: Diagnosis not present

## 2018-06-14 DIAGNOSIS — E785 Hyperlipidemia, unspecified: Secondary | ICD-10-CM | POA: Insufficient documentation

## 2018-06-14 DIAGNOSIS — S43002A Unspecified subluxation of left shoulder joint, initial encounter: Secondary | ICD-10-CM | POA: Insufficient documentation

## 2018-06-14 DIAGNOSIS — G8194 Hemiplegia, unspecified affecting left nondominant side: Secondary | ICD-10-CM | POA: Diagnosis present

## 2018-06-14 MED FILL — ATORVASTATIN 40 MG TABLET: 40 | 30 days supply | Qty: 30 | Fill #2

## 2018-06-14 MED FILL — AMLODIPINE BESYLATE 5 MG TA: 5 | 30 days supply | Qty: 30 | Fill #2

## 2018-06-14 NOTE — Progress Notes (Signed)
  Botox Injection for spasticity using needle EMG guidance  Dilution: 50 Units/ml Indication: Severe spasticity which interferes with ADL,mobility and/or  hygiene and is unresponsive to medication management and other conservative care Informed consent was obtained after describing risks and benefits of the procedure with the patient. This includes bleeding, bruising, infection, excessive weakness, or medication side effects. A REMS form is on file and signed. Needle: 50mm 25g needle electrode Number of units per muscle Posterior tibialis 75 Gastroc medial 75  FHL 50 FDL 100 All injections were done after obtaining appropriate EMG activity and after negative drawback for blood. The patient tolerated the procedure well. Post procedure instructions were given. A followup appointment was made.  

## 2018-06-14 NOTE — Patient Instructions (Signed)

## 2018-06-21 MED FILL — LOSARTAN POTASSIUM 100 MG T: 100 | 30 days supply | Qty: 30 | Fill #5

## 2018-06-21 MED FILL — CLOPIDOGREL 75 MG TABLET: 75 | 30 days supply | Qty: 30 | Fill #5

## 2018-06-29 DIAGNOSIS — H5203 Hypermetropia, bilateral: Secondary | ICD-10-CM | POA: Diagnosis not present

## 2018-06-29 DIAGNOSIS — H52209 Unspecified astigmatism, unspecified eye: Secondary | ICD-10-CM | POA: Diagnosis not present

## 2018-06-29 DIAGNOSIS — H524 Presbyopia: Secondary | ICD-10-CM | POA: Diagnosis not present

## 2018-06-30 ENCOUNTER — Encounter: Payer: Medicare HMO | Admitting: Family Medicine

## 2018-07-12 MED FILL — AMLODIPINE BESYLATE 5 MG TA: 5 | 30 days supply | Qty: 30 | Fill #3

## 2018-07-12 MED FILL — ATORVASTATIN CALCIUM 40 MG: 40 | 30 days supply | Qty: 30 | Fill #3

## 2018-07-19 MED FILL — CLOPIDOGREL 75 MG TABLET: 75 | 30 days supply | Qty: 30 | Fill #6

## 2018-07-19 MED FILL — LOSARTAN POTASSIUM 100 MG T: 100 | 30 days supply | Qty: 30 | Fill #6

## 2018-07-21 ENCOUNTER — Ambulatory Visit: Payer: Medicare HMO | Attending: Family Medicine | Admitting: Family Medicine

## 2018-07-21 ENCOUNTER — Encounter: Payer: Self-pay | Admitting: Family Medicine

## 2018-07-21 VITALS — BP 146/86 | HR 104 | Temp 97.5°F | Ht 65.0 in | Wt 186.6 lb

## 2018-07-21 DIAGNOSIS — K219 Gastro-esophageal reflux disease without esophagitis: Secondary | ICD-10-CM | POA: Insufficient documentation

## 2018-07-21 DIAGNOSIS — Z0001 Encounter for general adult medical examination with abnormal findings: Secondary | ICD-10-CM

## 2018-07-21 DIAGNOSIS — Z79899 Other long term (current) drug therapy: Secondary | ICD-10-CM | POA: Insufficient documentation

## 2018-07-21 DIAGNOSIS — G8194 Hemiplegia, unspecified affecting left nondominant side: Secondary | ICD-10-CM | POA: Diagnosis not present

## 2018-07-21 DIAGNOSIS — Z880 Allergy status to penicillin: Secondary | ICD-10-CM | POA: Insufficient documentation

## 2018-07-21 DIAGNOSIS — J45909 Unspecified asthma, uncomplicated: Secondary | ICD-10-CM | POA: Insufficient documentation

## 2018-07-21 DIAGNOSIS — E785 Hyperlipidemia, unspecified: Secondary | ICD-10-CM | POA: Insufficient documentation

## 2018-07-21 DIAGNOSIS — E119 Type 2 diabetes mellitus without complications: Secondary | ICD-10-CM | POA: Diagnosis not present

## 2018-07-21 DIAGNOSIS — I10 Essential (primary) hypertension: Secondary | ICD-10-CM | POA: Insufficient documentation

## 2018-07-21 DIAGNOSIS — R531 Weakness: Secondary | ICD-10-CM | POA: Diagnosis not present

## 2018-07-21 DIAGNOSIS — Z23 Encounter for immunization: Secondary | ICD-10-CM | POA: Diagnosis not present

## 2018-07-21 DIAGNOSIS — Z791 Long term (current) use of non-steroidal anti-inflammatories (NSAID): Secondary | ICD-10-CM | POA: Insufficient documentation

## 2018-07-21 DIAGNOSIS — Z0000 Encounter for general adult medical examination without abnormal findings: Secondary | ICD-10-CM

## 2018-07-21 DIAGNOSIS — Z8673 Personal history of transient ischemic attack (TIA), and cerebral infarction without residual deficits: Secondary | ICD-10-CM | POA: Insufficient documentation

## 2018-07-21 LAB — GLUCOSE, POCT (MANUAL RESULT ENTRY): POC GLUCOSE: 116 mg/dL — AB (ref 70–99)

## 2018-07-21 LAB — POCT GLYCOSYLATED HEMOGLOBIN (HGB A1C): HBA1C, POC (CONTROLLED DIABETIC RANGE): 5.8 % (ref 0.0–7.0)

## 2018-07-21 NOTE — Progress Notes (Signed)
Subjective:  Patient ID: Rebecca Yoder, female    DOB: September 26, 1965  Age: 52 y.o. MRN: [id number]  CC:   HPI Rebecca Yoder s a 52 year old female with history of type 2 diabetes mellitus (A1c 6.0),hypertension, right middle cerebral artery stroke with left spastic hemiparesis, GERD, asthma who presents today for a Medicare wellness exam  Advanced Directives: Discussed-would like sister Rebecca Yoder to be healthcare power of attorney; advised to obtain legal documentation to this effect Cancer screening: Colonoscopy-referred at last visit (provided number to [facility] to schedule).  Mammogram up-to-date from 03/2018.  Pap smear up-to-date from 06/2017 Fall Risk: High risk for falls due to CVA with left hemiparesis Functional Assessment/ ADLs: Semi-independent Urinary Incontinence: None identified Osteoporosis screen: Not due  Pain screening: None Physical activity /exercise: As tolerated.  Ambulates with the aid of a cane Statin therapy in CVD: On statin Tobacco use: Negative Vaccinations: Needs flu shot which will be administered today Specialists: Dr. Martyn Malay medicine  Endorses presence of grab bars, walk-in shower.  Denies presence of rug in hallway.  Denies depressive symptoms.  Past Medical History:  Diagnosis Date  . ASTHMA 12/10/2009  . CEREBROVASCULAR ACCIDENT, HX OF 12/10/2009  . Complication of anesthesia    "just can't get me up" (06/09/2013)  . Embolic stroke of right basal ganglia ([place]) 06/11/2015   with L residual weakness/hemiparesis   . HYPERLIPIDEMIA 12/10/2009  . HYPERTENSION 12/10/2009  . Stroke [facility]) 2008   denies residual on 06/09/2013  . Tendonitis of wrist, right     Past Surgical History:  Procedure Laterality Date  . COSMETIC SURGERY    . OVARIAN CYST REMOVAL  1990's    Allergies  Allergen Reactions  . Penicillins Rash     Outpatient Medications Prior to Visit  Medication Sig Dispense Refill  . acetaminophen (TYLENOL) 325 MG tablet Take 2  tablets (650 mg total) by mouth every 6 (six) hours as needed. 20 tablet 0  . albuterol (PROAIR HFA) 108 (90 Base) MCG/ACT inhaler INHALE 2 PUFFS INTO THE LUNGS EVERY 6 HOURS AS NEEDED FOR WHEEZING. 8.5 g 1  . amLODipine (NORVASC) 5 MG tablet Take 1 tablet (5 mg total) by mouth daily. 30 tablet 6  . atorvastatin (LIPITOR) 40 MG tablet Take 1 tablet (40 mg total) by mouth daily. 30 tablet 5  . baclofen (LIORESAL) 10 MG tablet Take 1 tablet (10 mg total) by mouth 2 (two) times daily as needed for muscle spasms. 60 tablet 6  . clopidogrel (PLAVIX) 75 MG tablet Take 1 tablet (75 mg total) by mouth daily. 30 tablet 6  . diphenhydrAMINE (BENADRYL) 25 MG tablet Take 50 mg by mouth at bedtime.    . fluticasone (FLOVENT HFA) 44 MCG/ACT inhaler Inhale 2 puffs into the lungs 2 (two) times daily. 1 Inhaler 5  . ibuprofen (ADVIL,MOTRIN) 600 MG tablet Take 1 tablet (600 mg total) by mouth every 8 (eight) hours as needed. For painful menses 30 tablet 0  . losartan (COZAAR) 100 MG tablet Take 1 tablet (100 mg total) by mouth daily. 30 tablet 6  . Multiple Vitamin (MULTIVITAMIN WITH MINERALS) TABS tablet Take 1 tablet by mouth daily.    . pantoprazole (PROTONIX) 40 MG tablet TAKE 1 TABLET BY MOUTH prn 30 tablet 6  . triamcinolone ointment (KENALOG) 0.5 % Apply 1 application topically 2 (two) times daily. 30 g 0   No facility-administered medications prior to visit.     ROS Review of Systems  Constitutional: Negative for activity  change, appetite change and fatigue.  HENT: Negative for congestion, sinus pressure and sore throat.   Eyes: Negative for visual disturbance.  Respiratory: Negative for cough, chest tightness, shortness of breath and wheezing.   Cardiovascular: Negative for chest pain and palpitations.  Gastrointestinal: Negative for abdominal distention, abdominal pain and constipation.  Endocrine: Negative for polydipsia.  Genitourinary: Negative for dysuria and frequency.  Musculoskeletal:  Negative for arthralgias and back pain.  Skin: Negative for rash.  Neurological: Positive for weakness. Negative for tremors, light-headedness and numbness.  Hematological: Does not bruise/bleed easily.  Psychiatric/Behavioral: Negative for agitation and behavioral problems.    Objective:  BP (!) 146/86   Pulse (!) 104   Temp (!) 97.5 F (36.4 C) (Oral)   Ht 5\' 5"  (1.651 m)   Wt 186 lb 9.6 oz (84.6 kg)   SpO2 99%   BMI 31.05 kg/m   BP/Weight 07/21/2018 06/14/2018 2/86/3817  Systolic BP 711 657 903  Diastolic BP 86 78 86  Wt. (Lbs) 186.6 190 185  BMI 31.05 31.62 29.86      Physical Exam  Constitutional: She is oriented to person, place, and time. She appears well-developed and well-nourished.  Cardiovascular: Normal rate, normal heart sounds and intact distal pulses.  No murmur heard. Pulmonary/Chest: Effort normal and breath sounds normal. She has no wheezes. She has no rales. She exhibits no tenderness.  Abdominal: Soft. Bowel sounds are normal. She exhibits no distension and no mass. There is no tenderness.  Musculoskeletal:  Left spastic hemiparesis  Neurological: She is alert and oriented to person, place, and time.  Skin: Skin is warm and dry.    Lab Results  Component Value Date   HGBA1C 5.8 07/21/2018    Assessment & Plan:   1. Medicare annual wellness visit, subsequent Information sheet provided for patient  2. Controlled type 2 diabetes mellitus without complication, without long-term current use of insulin (HCC) Controlled with A1c of 5.8 - POCT glucose (manual entry) - POCT glycosylated hemoglobin (Hb A1C)  3. Need for immunization against influenza - Flu Vaccine QUAD 36+ mos IM   No orders of the defined types were placed in this encounter.   Follow-up: Return for follow up of chronic medical conditions, keep previously scheduled appointment.   Charlott Rakes Yoder

## 2018-07-21 NOTE — Patient Instructions (Signed)
  Rebecca Yoder , Thank you for taking time to come for your Medicare Wellness Visit. I appreciate your ongoing commitment to your health goals. Please review the following plan we discussed and let me know if I can assist you in the future.   These are the goals we discussed: Goals    . Blood Pressure < 140/90       This is a list of the screening recommended for you and due dates:  Health Maintenance  Topic Date Due  . Colon Cancer Screening  02/28/2018  . Flu Shot  03/11/2018  . Hemoglobin A1C  10/12/2018  . Complete foot exam   01/12/2019  . Eye exam for diabetics  06/23/2019  . Mammogram  03/23/2020  . Pap Smear  07/07/2022  . Tetanus Vaccine  06/04/2023  . Pneumococcal vaccine  Completed  . HIV Screening  Completed

## 2018-07-29 ENCOUNTER — Ambulatory Visit: Payer: Medicare HMO | Admitting: Physical Medicine & Rehabilitation

## 2018-07-29 ENCOUNTER — Encounter: Payer: Self-pay | Admitting: Physical Medicine & Rehabilitation

## 2018-07-29 ENCOUNTER — Encounter: Payer: Medicare HMO | Attending: Physical Medicine & Rehabilitation

## 2018-07-29 VITALS — BP 129/82 | HR 104 | Ht 65.0 in | Wt 185.4 lb

## 2018-07-29 DIAGNOSIS — M7502 Adhesive capsulitis of left shoulder: Secondary | ICD-10-CM | POA: Insufficient documentation

## 2018-07-29 DIAGNOSIS — M7542 Impingement syndrome of left shoulder: Secondary | ICD-10-CM | POA: Insufficient documentation

## 2018-07-29 DIAGNOSIS — Z79899 Other long term (current) drug therapy: Secondary | ICD-10-CM | POA: Insufficient documentation

## 2018-07-29 DIAGNOSIS — M25512 Pain in left shoulder: Secondary | ICD-10-CM | POA: Insufficient documentation

## 2018-07-29 DIAGNOSIS — R269 Unspecified abnormalities of gait and mobility: Secondary | ICD-10-CM | POA: Insufficient documentation

## 2018-07-29 DIAGNOSIS — G8194 Hemiplegia, unspecified affecting left nondominant side: Secondary | ICD-10-CM | POA: Diagnosis present

## 2018-07-29 DIAGNOSIS — I10 Essential (primary) hypertension: Secondary | ICD-10-CM | POA: Diagnosis not present

## 2018-07-29 DIAGNOSIS — S43002A Unspecified subluxation of left shoulder joint, initial encounter: Secondary | ICD-10-CM | POA: Insufficient documentation

## 2018-07-29 DIAGNOSIS — E785 Hyperlipidemia, unspecified: Secondary | ICD-10-CM | POA: Insufficient documentation

## 2018-07-29 DIAGNOSIS — I69354 Hemiplegia and hemiparesis following cerebral infarction affecting left non-dominant side: Secondary | ICD-10-CM | POA: Diagnosis not present

## 2018-07-29 DIAGNOSIS — Z8673 Personal history of transient ischemic attack (TIA), and cerebral infarction without residual deficits: Secondary | ICD-10-CM | POA: Diagnosis present

## 2018-07-29 DIAGNOSIS — G8110 Spastic hemiplegia affecting unspecified side: Secondary | ICD-10-CM | POA: Diagnosis not present

## 2018-07-29 NOTE — Progress Notes (Signed)
Subjective:    Patient ID: Rebecca Yoder, female    DOB: 09/03/1965, 52 y.o.   MRN: [id number] right-handed female with a history of hypertension, previous right brain CVA 2014, remote right frontal insular infarct 2008 with known PFO maintained on aspirin. Lives with her sister and mother who has cancer.  Presented June 11, 2015 with sudden onset of left-sided weakness, slurred speech, right gaze preference.  Cranial CT scan negative.  The patient did receive tPA.  CT of the head and neck showed occluded M1 segment of the right middle cerebral artery that appeared to have progressed since prior MR angiogram.  MRI of the brain, June 13, 2015 showed progressive nonhemorrhagic infarct involving the right basal ganglia.  Remote anterior right MCA territory infarct involving the insular cortex. Echocardiogram with ejection fraction of 70%, no wall motion abnormalities.  Venous Doppler of the lower extremities negative. Neurology consulted, maintained on Plavix therapy for CVA prophylaxis.  HPI   Patient states she is now walking with a small base quad cane rather than wide-based quad cane.  [address] about a month ago without injury  Botox 6 wk post  Posterior tibialis 75 Gastroc medial 75  FHL 50 FDL 100  Goes to gym 2-3 times a week for 90 min Walks and does exercise machine, nu step    Pain Inventory Average Pain 0 Pain Right Now 0 My pain is sharp  In the last 24 hours, has pain interfered with the following? General activity 8 Relation with others 9 Enjoyment of life 9 What TIME of day is your pain at its worst? evening and night Sleep (in general) Good  Pain is worse with: none Pain improves with: therapy/exercise Relief from Meds: 10  Mobility walk with assistance use a cane ability to climb steps?  yes do you drive?  no use a wheelchair  Function disabled: date disabled 05/2015 I need assistance with the following:  household duties Do you have  any goals in this area?  yes  Neuro/Psych trouble walking  Prior Studies Any changes since last visit?  no  Physicians involved in your care Any changes since last visit?  no   Family History  Problem Relation Age of Onset  . Multiple myeloma Mother    Social History   Socioeconomic History  . Marital status: Single    Spouse name: Not on file  . Number of children: 0  . Years of education: 12th  . Highest education level: Not on file  Occupational History  . Occupation: budd    Fish farm manager: [employer]  Social Needs  . Financial resource strain: Not on file  . Food insecurity:    Worry: Not on file    Inability: Not on file  . Transportation needs:    Medical: Not on file    Non-medical: Not on file  Tobacco Use  . Smoking status: Never Smoker  . Smokeless tobacco: Never Used  Substance and Sexual Activity  . Alcohol use: No    Alcohol/week: 0.0 standard drinks  . Drug use: No  . Sexual activity: Yes  Lifestyle  . Physical activity:    Days per week: Not on file    Minutes per session: Not on file  . Stress: Not on file  Relationships  . Social connections:    Talks on phone: Not on file    Gets together: Not on file    Attends religious service: Not on file    Active member of  club or organization: Not on file    Attends meetings of clubs or organizations: Not on file    Relationship status: Not on file  Other Topics Concern  . Not on file  Social History Narrative  . Not on file   Past Surgical History:  Procedure Laterality Date  . COSMETIC SURGERY    . OVARIAN CYST REMOVAL  1990's   Past Medical History:  Diagnosis Date  . ASTHMA 12/10/2009  . CEREBROVASCULAR ACCIDENT, HX OF 12/10/2009  . Complication of anesthesia    "just can't get me up" (06/09/2013)  . Embolic stroke of right basal ganglia ([place]) 06/11/2015   with L residual weakness/hemiparesis   . HYPERLIPIDEMIA 12/10/2009  . HYPERTENSION 12/10/2009  . Stroke [facility]) 2008   denies residual  on 06/09/2013  . Tendonitis of wrist, right    BP 129/82   Pulse (!) 104   Ht '5\' 5"'  (1.651 m)   Wt 185 lb 6.4 oz (84.1 kg)   SpO2 96%   BMI 30.85 kg/m   Opioid Risk Score:   Fall Risk Score:  `1  Depression screen PHQ 2/9  Depression screen [facility] 2/9 04/13/2018 01/26/2018 12/23/2017 10/09/2017 07/07/2017 05/04/2017 03/17/2017  Decreased Interest '3 3 1 3 3 2 2  ' Down, Depressed, Hopeless 0 1 0 1 0 0 0  PHQ - 2 Score '3 4 1 4 3 2 2  ' Altered sleeping 3 - 2 0 0 0 0  Tired, decreased energy 2 - 3 0 '3 2 2  ' Change in appetite 0 - 3 0 0 0 0  Feeling bad or failure about yourself  0 - 0 0 0 0 0  Trouble concentrating 0 - 2 0 0 0 0  Moving slowly or fidgety/restless 0 - 2 0 0 0 3  Suicidal thoughts 0 - 0 0 0 0 0  PHQ-9 Score 8 - '13 4 6 4 7  ' Some recent data might be hidden    Review of Systems  Constitutional: Negative.   HENT: Negative.   Eyes: Negative.   Respiratory: Negative.   Cardiovascular: Negative.   Gastrointestinal: Negative.   Endocrine: Negative.   Genitourinary: Negative.   Musculoskeletal: Negative.   Skin: Negative.   Allergic/Immunologic: Negative.   Neurological: Negative.   Hematological: Negative.   Psychiatric/Behavioral: Negative.   All other systems reviewed and are negative.      Objective:   Physical Exam Vitals signs and nursing note reviewed.  Constitutional:      Appearance: Normal appearance.  HENT:     Head: Normocephalic and atraumatic.     Nose: Nose normal.     Mouth/Throat:     Mouth: Mucous membranes are moist.  Eyes:     Extraocular Movements: Extraocular movements intact.     Pupils: Pupils are equal, round, and reactive to light.  Skin:    General: Skin is warm and dry.  Neurological:     Mental Status: She is alert and oriented to person, place, and time.     Comments: Motor strength is 3- at the left deltoid to minus at the biceps and triceps 3- at the finger flexors and extensors on the left side Left lower limb 4- at the hip knee  extensor synergy 3- at the left hip flexor  Ambulates with a small base quad cane she does not use AFO she does exhibit some hip hiking  Psychiatric:        Mood and Affect: Mood normal.  Behavior: Behavior normal.    Able to Zip coat up     Assessment & Plan:  1.  Right middle cerebral artery infarct causing left spastic hemiplegia, nondominant. Overall she has had an excellent functional recovery.  She is keeping up with home exercise program and in fact exceeding it by performing aerobic as well as strength training exercises 3 to 4 hours/week She is modified independent with all her self-care activities. She has done well with current Botox dosing. We will repeat in 6 weeks  Over half of the 25 min visit was spent counseling and coordinating care.  We discussed her exercise program, viewed videos of her exercising in the gym and discussed some of the exercises.

## 2018-08-09 DIAGNOSIS — M21372 Foot drop, left foot: Secondary | ICD-10-CM | POA: Diagnosis not present

## 2018-08-16 MED FILL — ATORVASTATIN CALCIUM 40 MG: 40 | 30 days supply | Qty: 30 | Fill #4

## 2018-08-16 MED FILL — CLOPIDOGREL 75 MG TABLET: 75 | 30 days supply | Qty: 30 | Fill #0

## 2018-08-16 MED FILL — AMLODIPINE BESYLATE 5 MG TA: 5 | 30 days supply | Qty: 30 | Fill #4

## 2018-08-16 MED FILL — LOSARTAN POTASSIUM 100 MG T: 100 | 30 days supply | Qty: 30 | Fill #0

## 2018-08-24 DIAGNOSIS — M21372 Foot drop, left foot: Secondary | ICD-10-CM | POA: Diagnosis not present

## 2018-09-09 ENCOUNTER — Ambulatory Visit: Payer: Medicare HMO | Admitting: Physical Medicine & Rehabilitation

## 2018-09-13 ENCOUNTER — Ambulatory Visit: Payer: Medicare HMO | Admitting: Physical Medicine & Rehabilitation

## 2018-09-13 ENCOUNTER — Encounter: Payer: Self-pay | Admitting: Physical Medicine & Rehabilitation

## 2018-09-13 ENCOUNTER — Encounter: Payer: Medicare HMO | Attending: Physical Medicine & Rehabilitation

## 2018-09-13 VITALS — BP 147/93 | HR 91 | Resp 14 | Ht 65.0 in | Wt 187.0 lb

## 2018-09-13 DIAGNOSIS — M25512 Pain in left shoulder: Secondary | ICD-10-CM | POA: Diagnosis not present

## 2018-09-13 DIAGNOSIS — I10 Essential (primary) hypertension: Secondary | ICD-10-CM | POA: Diagnosis not present

## 2018-09-13 DIAGNOSIS — Z8673 Personal history of transient ischemic attack (TIA), and cerebral infarction without residual deficits: Secondary | ICD-10-CM | POA: Diagnosis present

## 2018-09-13 DIAGNOSIS — Z79899 Other long term (current) drug therapy: Secondary | ICD-10-CM | POA: Diagnosis not present

## 2018-09-13 DIAGNOSIS — M7502 Adhesive capsulitis of left shoulder: Secondary | ICD-10-CM | POA: Insufficient documentation

## 2018-09-13 DIAGNOSIS — I69354 Hemiplegia and hemiparesis following cerebral infarction affecting left non-dominant side: Secondary | ICD-10-CM | POA: Insufficient documentation

## 2018-09-13 DIAGNOSIS — G8110 Spastic hemiplegia affecting unspecified side: Secondary | ICD-10-CM

## 2018-09-13 DIAGNOSIS — M7542 Impingement syndrome of left shoulder: Secondary | ICD-10-CM | POA: Diagnosis not present

## 2018-09-13 DIAGNOSIS — G8194 Hemiplegia, unspecified affecting left nondominant side: Secondary | ICD-10-CM | POA: Diagnosis present

## 2018-09-13 DIAGNOSIS — R269 Unspecified abnormalities of gait and mobility: Secondary | ICD-10-CM | POA: Diagnosis not present

## 2018-09-13 DIAGNOSIS — S43002A Unspecified subluxation of left shoulder joint, initial encounter: Secondary | ICD-10-CM | POA: Diagnosis not present

## 2018-09-13 DIAGNOSIS — E785 Hyperlipidemia, unspecified: Secondary | ICD-10-CM | POA: Diagnosis not present

## 2018-09-13 MED FILL — ATORVASTATIN CALCIUM 40 MG: 40 | 30 days supply | Qty: 30 | Fill #5

## 2018-09-13 MED FILL — AMLODIPINE BESYLATE 5 MG TA: 5 | 30 days supply | Qty: 30 | Fill #5

## 2018-09-13 NOTE — Progress Notes (Signed)
  Botox Injection for spasticity using needle EMG guidance  Dilution: 50 Units/ml Indication: Severe spasticity which interferes with ADL,mobility and/or  hygiene and is unresponsive to medication management and other conservative care Informed consent was obtained after describing risks and benefits of the procedure with the patient. This includes bleeding, bruising, infection, excessive weakness, or medication side effects. A REMS form is on file and signed. Needle: 21mm 25g needle electrode Number of units per muscle Posterior tibialis 75 Gastroc medial 75  FHL 50 FDL 100 All injections were done after obtaining appropriate EMG activity and after negative drawback for blood. The patient tolerated the procedure well. Post procedure instructions were given. A followup appointment was made.

## 2018-09-13 NOTE — Patient Instructions (Signed)

## 2018-09-20 MED FILL — LOSARTAN POTASSIUM 100 MG T: 100 | 30 days supply | Qty: 30 | Fill #1

## 2018-09-20 MED FILL — CLOPIDOGREL 75 MG TABLET: 75 | 30 days supply | Qty: 30 | Fill #1

## 2018-09-21 ENCOUNTER — Telehealth: Payer: Self-pay | Admitting: Physical Medicine & Rehabilitation

## 2018-09-21 NOTE — Telephone Encounter (Signed)
error 

## 2018-10-13 ENCOUNTER — Other Ambulatory Visit: Payer: Self-pay | Admitting: Family Medicine

## 2018-10-13 DIAGNOSIS — E119 Type 2 diabetes mellitus without complications: Secondary | ICD-10-CM

## 2018-10-13 MED FILL — ATORVASTATIN CALCIUM 40 MG: 40 | 30 days supply | Qty: 30 | Fill #0

## 2018-10-13 MED FILL — AMLODIPINE BESYLATE 5 MG TA: 5 | 30 days supply | Qty: 30 | Fill #6

## 2018-10-19 MED FILL — LOSARTAN POTASSIUM 100 MG T: 100 | 30 days supply | Qty: 30 | Fill #2

## 2018-10-19 MED FILL — CLOPIDOGREL 75 MG TABLET: 75 | 30 days supply | Qty: 30 | Fill #2

## 2018-10-20 ENCOUNTER — Encounter: Payer: Self-pay | Admitting: Family Medicine

## 2018-10-20 ENCOUNTER — Other Ambulatory Visit: Payer: Self-pay

## 2018-10-20 ENCOUNTER — Ambulatory Visit: Payer: Medicare HMO | Attending: Family Medicine | Admitting: Family Medicine

## 2018-10-20 ENCOUNTER — Ambulatory Visit: Payer: Medicare HMO | Admitting: Family Medicine

## 2018-10-20 VITALS — BP 154/98 | HR 97 | Temp 98.2°F | Ht 65.0 in | Wt 187.4 lb

## 2018-10-20 DIAGNOSIS — I10 Essential (primary) hypertension: Secondary | ICD-10-CM

## 2018-10-20 DIAGNOSIS — I63511 Cerebral infarction due to unspecified occlusion or stenosis of right middle cerebral artery: Secondary | ICD-10-CM

## 2018-10-20 DIAGNOSIS — K5909 Other constipation: Secondary | ICD-10-CM

## 2018-10-20 DIAGNOSIS — E119 Type 2 diabetes mellitus without complications: Secondary | ICD-10-CM

## 2018-10-20 DIAGNOSIS — G8194 Hemiplegia, unspecified affecting left nondominant side: Secondary | ICD-10-CM

## 2018-10-20 DIAGNOSIS — K219 Gastro-esophageal reflux disease without esophagitis: Secondary | ICD-10-CM

## 2018-10-20 LAB — GLUCOSE, POCT (MANUAL RESULT ENTRY): POC GLUCOSE: 93 mg/dL (ref 70–99)

## 2018-10-20 LAB — POCT GLYCOSYLATED HEMOGLOBIN (HGB A1C): HBA1C, POC (CONTROLLED DIABETIC RANGE): 6.3 % (ref 0.0–7.0)

## 2018-10-20 MED ORDER — AMLODIPINE BESYLATE 10 MG PO TABS: 10.0000 mg | ORAL_TABLET | Freq: Every day | ORAL | 6 refills | Status: DC

## 2018-10-20 MED ORDER — LOSARTAN POTASSIUM 100 MG PO TABS: 100.0000 mg | ORAL_TABLET | Freq: Every day | ORAL | 6 refills | Status: DC

## 2018-10-20 MED ORDER — CLOPIDOGREL BISULFATE 75 MG PO TABS: 75.0000 mg | ORAL_TABLET | Freq: Every day | ORAL | 6 refills | Status: DC

## 2018-10-20 MED ORDER — ATORVASTATIN CALCIUM 40 MG PO TABS: 40.0000 mg | ORAL_TABLET | Freq: Every day | ORAL | 6 refills | Status: DC

## 2018-10-20 MED ORDER — PANTOPRAZOLE SODIUM 40 MG PO TBEC: DELAYED_RELEASE_TABLET | ORAL | 6 refills | Status: DC

## 2018-10-20 MED ORDER — BACLOFEN 10 MG PO TABS: 10.0000 mg | ORAL_TABLET | Freq: Two times a day (BID) | ORAL | 6 refills | Status: DC | PRN

## 2018-10-20 MED ORDER — POLYETHYLENE GLYCOL 3350 17 G PO PACK: 17.0000 g | PACK | Freq: Every day | ORAL | 1 refills | Status: AC

## 2018-10-20 NOTE — Patient Instructions (Signed)

## 2018-10-20 NOTE — Progress Notes (Signed)
Subjective:  Patient ID: Rebecca Yoder, female    DOB: [birth year]  Age: 53 y.o. MRN: [id number]  CC: Diabetes   HPI Rebecca Yoder s a 53 year old female with history of type 2 diabetes mellitus (A1c 6.3),hypertension, right middle cerebral artery stroke with left spastic hemiparesis, GERD, asthma who presents today for a follow-up visit.  She complains of constipation and having to move her bowels every other day.  She does not have a great intake of fiber.  Denies nausea, vomiting, abdominal pain. With regards to her diabetes she denies hypoglycemia, numbness in extremities, visual concerns and is requesting diabetic shoes.  Last seen by rehab medicine-Dr. Letta Pate last month and is status post Botox injection; she has a follow-up later this month. Her blood pressure is elevated and she endorses compliance with her antihypertensive. With regards to healthcare maintenance she had been referred for colonoscopy and she is in the process of making an appointment.  Past Medical History:  Diagnosis Date  . ASTHMA 12/10/2009  . CEREBROVASCULAR ACCIDENT, HX OF 12/10/2009  . Complication of anesthesia    "just can't get me up" (06/09/2013)  . Embolic stroke of right basal ganglia ([place]) 06/11/2015   with L residual weakness/hemiparesis   . HYPERLIPIDEMIA 12/10/2009  . HYPERTENSION 12/10/2009  . Stroke [facility]) 2008   denies residual on 06/09/2013  . Tendonitis of wrist, right     Past Surgical History:  Procedure Laterality Date  . COSMETIC SURGERY    . OVARIAN CYST REMOVAL  1990's    Family History  Problem Relation Age of Onset  . Multiple myeloma Mother     Allergies  Allergen Reactions  . Penicillins Rash    Outpatient Medications Prior to Visit  Medication Sig Dispense Refill  . acetaminophen (TYLENOL) 325 MG tablet Take 2 tablets (650 mg total) by mouth every 6 (six) hours as needed. 20 tablet 0  . albuterol (PROAIR HFA) 108 (90 Base) MCG/ACT inhaler INHALE 2 PUFFS INTO  THE LUNGS EVERY 6 HOURS AS NEEDED FOR WHEEZING. 8.5 g 1  . diphenhydrAMINE (BENADRYL) 25 MG tablet Take 50 mg by mouth at bedtime.    . fluticasone (FLOVENT HFA) 44 MCG/ACT inhaler Inhale 2 puffs into the lungs 2 (two) times daily. 1 Inhaler 5  . ibuprofen (ADVIL,MOTRIN) 600 MG tablet Take 1 tablet (600 mg total) by mouth every 8 (eight) hours as needed. For painful menses 30 tablet 0  . Multiple Vitamin (MULTIVITAMIN WITH MINERALS) TABS tablet Take 1 tablet by mouth daily.    Marland Kitchen triamcinolone ointment (KENALOG) 0.5 % Apply 1 application topically 2 (two) times daily. 30 g 0  . amLODipine (NORVASC) 5 MG tablet Take 1 tablet (5 mg total) by mouth daily. 30 tablet 6  . atorvastatin (LIPITOR) 40 MG tablet TAKE 1 TABLET (40 MG TOTAL) BY MOUTH DAILY. 30 tablet 2  . baclofen (LIORESAL) 10 MG tablet Take 1 tablet (10 mg total) by mouth 2 (two) times daily as needed for muscle spasms. 60 tablet 6  . clopidogrel (PLAVIX) 75 MG tablet Take 1 tablet (75 mg total) by mouth daily. 30 tablet 6  . losartan (COZAAR) 100 MG tablet Take 1 tablet (100 mg total) by mouth daily. 30 tablet 6  . pantoprazole (PROTONIX) 40 MG tablet TAKE 1 TABLET BY MOUTH prn 30 tablet 6   No facility-administered medications prior to visit.      ROS Review of Systems  Constitutional: Negative for activity change, appetite change and fatigue.  HENT:  Negative for congestion, sinus pressure and sore throat.   Eyes: Negative for visual disturbance.  Respiratory: Negative for cough, chest tightness, shortness of breath and wheezing.   Cardiovascular: Negative for chest pain and palpitations.  Gastrointestinal: Positive for constipation. Negative for abdominal distention and abdominal pain.  Endocrine: Negative for polydipsia.  Genitourinary: Negative for dysuria and frequency.  Musculoskeletal: Negative for arthralgias and back pain.  Skin: Negative for rash.  Neurological: Positive for weakness. Negative for tremors,  light-headedness and numbness.  Hematological: Does not bruise/bleed easily.  Psychiatric/Behavioral: Negative for agitation and behavioral problems.    Objective:  BP (!) 154/98   Pulse 97   Temp 98.2 F (36.8 C) (Oral)   Ht '5\' 5"'  (1.651 m)   Wt 187 lb 6.4 oz (85 kg)   SpO2 98%   BMI 31.18 kg/m   BP/Weight 10/20/2018 09/13/2018 90/21/1155  Systolic BP 208 022 336  Diastolic BP 98 93 82  Wt. (Lbs) 187.4 187 185.4  BMI 31.18 31.12 30.85      Physical Exam Constitutional:      Appearance: She is well-developed.  Cardiovascular:     Rate and Rhythm: Normal rate.     Heart sounds: Normal heart sounds. No murmur.  Pulmonary:     Effort: Pulmonary effort is normal.     Breath sounds: Normal breath sounds. No wheezing or rales.  Chest:     Chest wall: No tenderness.  Abdominal:     General: Bowel sounds are normal. There is no distension.     Palpations: Abdomen is soft. There is no mass.     Tenderness: There is no abdominal tenderness.  Musculoskeletal:     Comments: L spastic hemiparesis L AFO brace in place  Neurological:     Mental Status: She is alert and oriented to person, place, and time.     CMP Latest Ref Rng & Units 10/13/2017 05/04/2017 09/15/2016  Glucose 65 - 99 mg/dL 91 104(H) 80  BUN 6 - 24 mg/dL '13 18 11  ' Creatinine 0.57 - 1.00 mg/dL 0.85 0.78 0.65  Sodium 134 - 144 mmol/L 141 139 142  Potassium 3.5 - 5.2 mmol/L 3.6 3.8 4.1  Chloride 96 - 106 mmol/L 99 96 107  CO2 20 - 29 mmol/L '25 26 25  ' Calcium 8.7 - 10.2 mg/dL 9.8 10.0 9.9  Total Protein 6.0 - 8.5 g/dL 7.6 7.8 7.5  Total Bilirubin 0.0 - 1.2 mg/dL 0.3 <0.2 0.3  Alkaline Phos 39 - 117 IU/L 68 65 55  AST 0 - 40 IU/L '17 16 14  ' ALT 0 - 32 IU/L '17 18 16    ' Lipid Panel     Component Value Date/Time   CHOL 148 10/13/2017 0841   TRIG 146 10/13/2017 0841   HDL 48 10/13/2017 0841   CHOLHDL 3.1 10/13/2017 0841   CHOLHDL 2.4 09/15/2016 1116   VLDL 22 09/15/2016 1116   LDLCALC 71 10/13/2017 0841     CBC    Component Value Date/Time   WBC 4.4 09/15/2016 1048   RBC 3.92 09/15/2016 1048   HGB 11.4 (L) 09/15/2016 1048   HCT 33.8 (L) 09/15/2016 1048   PLT 451 (H) 09/15/2016 1048   MCV 86.2 09/15/2016 1048   MCH 29.1 09/15/2016 1048   MCHC 33.7 09/15/2016 1048   RDW 14.8 09/15/2016 1048   LYMPHSABS 2.2 06/15/2015 0451   MONOABS 0.7 06/15/2015 0451   EOSABS 0.4 06/15/2015 0451   BASOSABS 0.0 06/15/2015 0451    Lab Results  Component Value Date   HGBA1C 6.3 10/20/2018    Assessment & Plan:   1. Controlled type 2 diabetes mellitus without complication, without long-term current use of insulin (HCC) Controlled Continue current management Counseled on Diabetic diet, my plate method, 056 minutes of moderate intensity exercise/week Keep blood sugar logs with fasting goals of 80-120 mg/dl, random of less than 180 and in the event of sugars less than 60 mg/dl or greater than 400 mg/dl please notify the clinic ASAP. It is recommended that you undergo annual eye exams and annual foot exams. Pneumonia vaccine is recommended. - POCT glucose (manual entry) - POCT glycosylated hemoglobin (Hb A1C) - Ambulatory referral to Podiatry - atorvastatin (LIPITOR) 40 MG tablet; Take 1 tablet (40 mg total) by mouth daily.  Dispense: 30 tablet; Refill: 6  2. Essential hypertension Uncontrolled Increase amlodipine dose Counseled on blood pressure goal of less than 130/80, low-sodium, DASH diet, medication compliance, 150 minutes of moderate intensity exercise per week. Discussed medication compliance, adverse effects. - amLODipine (NORVASC) 10 MG tablet; Take 1 tablet (10 mg total) by mouth daily.  Dispense: 30 tablet; Refill: 6 - CMP14+EGFR; Future - Lipid panel; Future - Microalbumin/Creatinine Ratio, Urine; Future - losartan (COZAAR) 100 MG tablet; Take 1 tablet (100 mg total) by mouth daily.  Dispense: 30 tablet; Refill: 6  3. Right middle cerebral artery stroke [facility]) With residual left  spastic hemiparesis Risk factor modification - clopidogrel (PLAVIX) 75 MG tablet; Take 1 tablet (75 mg total) by mouth daily.  Dispense: 30 tablet; Refill: 6  4. Left hemiparesis ([place]) Receiving Botox from rehab medicine - baclofen (LIORESAL) 10 MG tablet; Take 1 tablet (10 mg total) by mouth 2 (two) times daily as needed for muscle spasms.  Dispense: 60 tablet; Refill: 6  5. Gastroesophageal reflux disease, esophagitis presence not specified Stable - pantoprazole (PROTONIX) 40 MG tablet; TAKE 1 TABLET BY MOUTH prn  Dispense: 30 tablet; Refill: 6  6. Other constipation Increase fiber intake - polyethylene glycol (MIRALAX / GLYCOLAX) packet; Take 17 g by mouth daily.  Dispense: 30 each; Refill: 1   Meds ordered this encounter  Medications  . amLODipine (NORVASC) 10 MG tablet    Sig: Take 1 tablet (10 mg total) by mouth daily.    Dispense:  30 tablet    Refill:  6  . polyethylene glycol (MIRALAX / GLYCOLAX) packet    Sig: Take 17 g by mouth daily.    Dispense:  30 each    Refill:  1  . clopidogrel (PLAVIX) 75 MG tablet    Sig: Take 1 tablet (75 mg total) by mouth daily.    Dispense:  30 tablet    Refill:  6  . atorvastatin (LIPITOR) 40 MG tablet    Sig: Take 1 tablet (40 mg total) by mouth daily.    Dispense:  30 tablet    Refill:  6  . baclofen (LIORESAL) 10 MG tablet    Sig: Take 1 tablet (10 mg total) by mouth 2 (two) times daily as needed for muscle spasms.    Dispense:  60 tablet    Refill:  6  . losartan (COZAAR) 100 MG tablet    Sig: Take 1 tablet (100 mg total) by mouth daily.    Dispense:  30 tablet    Refill:  6  . pantoprazole (PROTONIX) 40 MG tablet    Sig: TAKE 1 TABLET BY MOUTH prn    Dispense:  30 tablet    Refill:  6  Follow-up: Return in about 3 months (around 01/20/2019) for follow up of chronic medical conditions.       Charlott Rakes, MD, FAAFP. [facility] and [facility], [place]   10/20/2018, 2:23  PM

## 2018-10-21 ENCOUNTER — Ambulatory Visit: Payer: Medicare HMO

## 2018-10-21 MED FILL — BACLOFEN 10 MG TABLET: 10 | 30 days supply | Qty: 60 | Fill #0

## 2018-10-21 MED FILL — AMLODIPINE BESYLATE 10 MG T: 10 | 30 days supply | Qty: 30 | Fill #0

## 2018-10-21 MED FILL — PANTOPRAZOLE SOD DR 40 MG T: 40 | 30 days supply | Qty: 30 | Fill #0

## 2018-10-25 ENCOUNTER — Encounter: Payer: Medicare HMO | Attending: Physical Medicine & Rehabilitation

## 2018-10-25 ENCOUNTER — Ambulatory Visit: Payer: Medicare HMO | Admitting: Physical Medicine & Rehabilitation

## 2018-10-25 ENCOUNTER — Other Ambulatory Visit: Payer: Self-pay

## 2018-10-25 ENCOUNTER — Encounter: Payer: Self-pay | Admitting: Physical Medicine & Rehabilitation

## 2018-10-25 VITALS — BP 146/95 | HR 100 | Ht 65.5 in | Wt 188.0 lb

## 2018-10-25 DIAGNOSIS — M7542 Impingement syndrome of left shoulder: Secondary | ICD-10-CM | POA: Diagnosis not present

## 2018-10-25 DIAGNOSIS — R269 Unspecified abnormalities of gait and mobility: Secondary | ICD-10-CM | POA: Diagnosis not present

## 2018-10-25 DIAGNOSIS — G8110 Spastic hemiplegia affecting unspecified side: Secondary | ICD-10-CM

## 2018-10-25 DIAGNOSIS — E785 Hyperlipidemia, unspecified: Secondary | ICD-10-CM | POA: Diagnosis not present

## 2018-10-25 DIAGNOSIS — I69354 Hemiplegia and hemiparesis following cerebral infarction affecting left non-dominant side: Secondary | ICD-10-CM | POA: Insufficient documentation

## 2018-10-25 DIAGNOSIS — M7502 Adhesive capsulitis of left shoulder: Secondary | ICD-10-CM | POA: Diagnosis not present

## 2018-10-25 DIAGNOSIS — F4323 Adjustment disorder with mixed anxiety and depressed mood: Secondary | ICD-10-CM | POA: Diagnosis not present

## 2018-10-25 DIAGNOSIS — E1140 Type 2 diabetes mellitus with diabetic neuropathy, unspecified: Secondary | ICD-10-CM | POA: Diagnosis not present

## 2018-10-25 DIAGNOSIS — Z79899 Other long term (current) drug therapy: Secondary | ICD-10-CM | POA: Diagnosis not present

## 2018-10-25 DIAGNOSIS — I10 Essential (primary) hypertension: Secondary | ICD-10-CM | POA: Insufficient documentation

## 2018-10-25 DIAGNOSIS — M25512 Pain in left shoulder: Secondary | ICD-10-CM | POA: Diagnosis not present

## 2018-10-25 DIAGNOSIS — S43002A Unspecified subluxation of left shoulder joint, initial encounter: Secondary | ICD-10-CM | POA: Diagnosis not present

## 2018-10-25 DIAGNOSIS — G8194 Hemiplegia, unspecified affecting left nondominant side: Secondary | ICD-10-CM | POA: Diagnosis present

## 2018-10-25 DIAGNOSIS — Z8673 Personal history of transient ischemic attack (TIA), and cerebral infarction without residual deficits: Secondary | ICD-10-CM | POA: Diagnosis present

## 2018-10-25 NOTE — Progress Notes (Signed)
Subjective:    Patient ID: Rebecca Yoder, female    DOB: 11-Dec-1965, 53 y.o.   MRN: [id number]  HPI  Botox performed 6 weeks ago.  Patient feels like this was helpful with her ambulation.  Using AFO as well as a small base quad cane Posterior tibialis 75 Gastroc medial 75  FHL 50 FDL 100 History of type 2 diabetes. Numbness In left >right  Depression better than last last week, seen by PCP PDQ 9 elevated at 11  Patient is a diabetic.  She feels her left foot is numb.  Denies history of calluses.  No history of toe or partial foot amputation. Pain Inventory Average Pain 0 Pain Right Now 0 My pain is sharp  In the last 24 hours, has pain interfered with the following? General activity 9 Relation with others 7 Enjoyment of life 6 What TIME of day is your pain at its worst? evening Sleep (in general) Good  Pain is worse with: . Pain improves with: therapy/exercise Relief from Meds: 10  Mobility use a cane ability to climb steps?  yes do you drive?  no  Function disabled: date disabled 2016  Neuro/Psych trouble walking  Prior Studies Any changes since last visit?  no  Physicians involved in your care Any changes since last visit?  no   Family History  Problem Relation Age of Onset   Multiple myeloma Mother    Social History   Socioeconomic History   Marital status: Single    Spouse name: Not on file   Number of children: 0   Years of education: 12th   Highest education level: Not on file  Occupational History   Occupation: budd    Fish farm manager: [employer]  Social Needs   Emergency planning/management officer strain: Not on file   Food insecurity:    Worry: Not on file    Inability: Not on file   Transportation needs:    Medical: Not on file    Non-medical: Not on file  Tobacco Use   Smoking status: Never Smoker   Smokeless tobacco: Never Used  Substance and Sexual Activity   Alcohol use: No    Alcohol/week: 0.0 standard drinks   Drug use: No     Sexual activity: Yes  Lifestyle   Physical activity:    Days per week: Not on file    Minutes per session: Not on file   Stress: Not on file  Relationships   Social connections:    Talks on phone: Not on file    Gets together: Not on file    Attends religious service: Not on file    Active member of club or organization: Not on file    Attends meetings of clubs or organizations: Not on file    Relationship status: Not on file  Other Topics Concern   Not on file  Social History Narrative   Not on file   Past Surgical History:  Procedure Laterality Date   COSMETIC SURGERY     OVARIAN CYST REMOVAL  1990's   Past Medical History:  Diagnosis Date   ASTHMA 12/10/2009   CEREBROVASCULAR ACCIDENT, HX OF 03/12/5052   Complication of anesthesia    "just can't get me up" (97/67/3419)   Embolic stroke of right basal ganglia ([place]) 06/11/2015   with L residual weakness/hemiparesis    HYPERLIPIDEMIA 12/10/2009   HYPERTENSION 12/10/2009   Stroke ([place]) 2008   denies residual on 06/09/2013   Tendonitis of wrist, right  BP (!) 146/95    Pulse 100    Ht 5' 5.5" (1.664 m)    Wt 188 lb (85.3 kg)    SpO2 95%    BMI 30.81 kg/m   Opioid Risk Score:   Fall Risk Score:  `1  Depression screen PHQ 2/9  Depression screen [facility] 2/9 10/20/2018 04/13/2018 01/26/2018 12/23/2017 10/09/2017 07/07/2017 05/04/2017  Decreased Interest '3 3 3 1 3 3 2  ' Down, Depressed, Hopeless 0 0 1 0 1 0 0  PHQ - 2 Score '3 3 4 1 4 3 2  ' Altered sleeping 3 3 - 2 0 0 0  Tired, decreased energy 2 2 - 3 0 3 2  Change in appetite 0 0 - 3 0 0 0  Feeling bad or failure about yourself  1 0 - 0 0 0 0  Trouble concentrating 2 0 - 2 0 0 0  Moving slowly or fidgety/restless 0 0 - 2 0 0 0  Suicidal thoughts 0 0 - 0 0 0 0  PHQ-9 Score 11 8 - '13 4 6 4  ' Some recent data might be hidden    Review of Systems  Constitutional: Negative.   HENT: Negative.   Eyes: Negative.   Respiratory: Negative.   Cardiovascular: Negative.    Gastrointestinal: Negative.   Endocrine: Negative.   Genitourinary: Negative.   Musculoskeletal: Positive for gait problem.  Skin: Negative.   Allergic/Immunologic: Negative.   Hematological: Negative.   Psychiatric/Behavioral: Negative.   All other systems reviewed and are negative.      Objective:   Physical Exam Pedal pulses 2+ bilaterally Posterior tibialis pulses absent Skin shows no evidence of breakdown on the right foot or on the left foot. There is reduced sensation in the right foot in a stocking distribution.  She has normal sensation at the knee to pinprick. On the left foot there is decreased sensation from the knee down to the foot Motor strength right side 5/5 in the EHL, tibialis anterior, plantar flexors. Motor strength left side is 3- plantar flexors trace ankle dorsiflexors 3- foot inverters 0 foot evertors Right hip flexor knee extensor 5/5 Left hip flexor knee extensor 4/5  Motor strength right upper extremity 5/5 deltoid bicep tricep grip Motor strength left upper extremity 4- at the biceps 3- tricep 3- deltoid 4- finger flexors and extensors.  Right ankle normal range of motion no evidence of lesions Left ankle has a callus over the lateral malleolus    Assessment & Plan:  1.  History of right frontal infarct with left spastic hemiplegia Good results from botulinum toxin injection Will repeat same dose and muscle group selection   2.  Diabetic neuropathy with reduced sensation The patient also has decreased posterior tibialis pulses bilaterally. Left foot has deformity equinovarus Left ankle has lateral malleoli are callus likely associated with AFO  Completed form for diabetic shoes  3.  Intermitent depression and anxiety, the pt states she prays and this is very helpful .  Also sees other people less fortunate than her Discussed neuropsych services  Over half of the 25 min visit was spent counseling and coordinating care.

## 2018-10-26 ENCOUNTER — Ambulatory Visit: Payer: Medicare HMO | Attending: Family Medicine

## 2018-10-26 DIAGNOSIS — I10 Essential (primary) hypertension: Secondary | ICD-10-CM | POA: Diagnosis not present

## 2018-10-27 ENCOUNTER — Other Ambulatory Visit: Payer: Self-pay | Admitting: Physician Assistant

## 2018-10-27 ENCOUNTER — Other Ambulatory Visit: Payer: Self-pay

## 2018-10-27 ENCOUNTER — Other Ambulatory Visit (HOSPITAL_COMMUNITY)
Admission: RE | Admit: 2018-10-27 | Discharge: 2018-10-27 | Disposition: A | Discharge status: Home / Self Care | Payer: Medicare HMO | Source: Ambulatory Visit | Attending: Family Medicine | Admitting: Family Medicine

## 2018-10-27 ENCOUNTER — Ambulatory Visit (HOSPITAL_BASED_OUTPATIENT_CLINIC_OR_DEPARTMENT_OTHER): Payer: Medicare HMO | Admitting: Physician Assistant

## 2018-10-27 VITALS — BP 155/95 | HR 110 | Temp 98.0°F | Resp 16 | Wt 188.2 lb

## 2018-10-27 DIAGNOSIS — Z202 Contact with and (suspected) exposure to infections with a predominantly sexual mode of transmission: Secondary | ICD-10-CM

## 2018-10-27 DIAGNOSIS — I10 Essential (primary) hypertension: Secondary | ICD-10-CM

## 2018-10-27 DIAGNOSIS — Z113 Encounter for screening for infections with a predominantly sexual mode of transmission: Secondary | ICD-10-CM | POA: Diagnosis not present

## 2018-10-27 LAB — POCT URINALYSIS DIP (CLINITEK)
Bilirubin, UA: NEGATIVE
Blood, UA: NEGATIVE
Glucose, UA: NEGATIVE mg/dL
Ketones, POC UA: NEGATIVE mg/dL
NITRITE UA: NEGATIVE
POC PROTEIN,UA: NEGATIVE
Spec Grav, UA: 1.01 (ref 1.010–1.025)
Urobilinogen, UA: 0.2 E.U./dL
pH, UA: 5.5 (ref 5.0–8.0)

## 2018-10-27 LAB — CMP14+EGFR
ALT: 33 [IU]/L — ABNORMAL HIGH (ref 0–32)
AST: 27 [IU]/L (ref 0–40)
Albumin/Globulin Ratio: 1.7 (ref 1.2–2.2)
Albumin: 4.9 g/dL — ABNORMAL HIGH (ref 3.8–4.8)
Alkaline Phosphatase: 94 [IU]/L (ref 39–117)
BUN/Creatinine Ratio: 12 (ref 9–23)
BUN: 10 mg/dL (ref 6–24)
Bilirubin Total: 0.3 mg/dL (ref 0.0–1.2)
CO2: 26 mmol/L (ref 20–29)
Calcium: 9.9 mg/dL (ref 8.7–10.2)
Chloride: 101 mmol/L (ref 96–106)
Creatinine, Ser: 0.85 mg/dL (ref 0.57–1.00)
GFR calc Af Amer: 92 mL/min/{1.73_m2}
GFR calc non Af Amer: 80 mL/min/{1.73_m2}
Globulin, Total: 2.9 g/dL (ref 1.5–4.5)
Glucose: 100 mg/dL — ABNORMAL HIGH (ref 65–99)
Potassium: 4.5 mmol/L (ref 3.5–5.2)
Sodium: 143 mmol/L (ref 134–144)
Total Protein: 7.8 g/dL (ref 6.0–8.5)

## 2018-10-27 LAB — LIPID PANEL
Chol/HDL Ratio: 2.2 ratio (ref 0.0–4.4)
Cholesterol, Total: 108 mg/dL (ref 100–199)
HDL: 49 mg/dL (ref >39)
LDL Calculated: 41 mg/dL (ref 0–99)
Triglycerides: 90 mg/dL (ref 0–149)
VLDL Cholesterol Cal: 18 mg/dL (ref 5–40)

## 2018-10-27 LAB — MICROALBUMIN / CREATININE URINE RATIO
Creatinine, Urine: 112.6 mg/dL
Microalb/Creat Ratio: 36 mg/g creat — ABNORMAL HIGH (ref 0–29)
Microalbumin, Urine: 40.1 ug/mL

## 2018-10-27 MED ORDER — METRONIDAZOLE 500 MG PO TABS: 2000.0000 mg | ORAL_TABLET | Freq: Once | ORAL | 0 refills | Status: AC

## 2018-10-27 MED FILL — metroNIDAZOLE 500 MG TABS: 500 | 1 days supply | Qty: 4 | Fill #0

## 2018-10-27 NOTE — Progress Notes (Signed)
Patient ID: Rebecca Yoder, female   DOB: [birth year], 53 y.o.   MRN: [id number]   Rebecca Yoder, is a 53 y.o. female  NMM:[id number]  RPR:[id number]  DOB - [birth year]  Subjective:  Chief Complaint and HPI: Rebecca Yoder is a 53 y.o. female here today for STD exposure.  Says BF told her that he is positive for trichomonas.  They use condoms.  She is monogamous with him.  Denies S/Sx.    ROS:   Constitutional:  No f/c, No night sweats, No unexplained weight loss. EENT:  No vision changes, No blurry vision, No hearing changes. No mouth, throat, or ear problems.  Respiratory: No cough, No SOB Cardiac: No CP, no palpitations GI:  No abd pain, No N/V/D. GU: No Urinary s/sx Musculoskeletal: No joint pain Neuro: No headache, no dizziness, no motor weakness.  Skin: No rash Endocrine:  No polydipsia. No polyuria.  Psych: Denies SI/HI  No problems updated.  ALLERGIES: Allergies  Allergen Reactions  . Penicillins Rash    PAST MEDICAL HISTORY: Past Medical History:  Diagnosis Date  . ASTHMA 12/10/2009  . CEREBROVASCULAR ACCIDENT, HX OF 12/10/2009  . Complication of anesthesia    "just can't get me up" (06/09/2013)  . Embolic stroke of right basal ganglia ([place]) 06/11/2015   with L residual weakness/hemiparesis   . HYPERLIPIDEMIA 12/10/2009  . HYPERTENSION 12/10/2009  . Stroke [facility]) 2008   denies residual on 06/09/2013  . Tendonitis of wrist, right     MEDICATIONS AT HOME: Prior to Admission medications   Medication Sig Start Date End Date Taking? Authorizing Provider  acetaminophen (TYLENOL) 325 MG tablet Take 2 tablets (650 mg total) by mouth every 6 (six) hours as needed. 06/10/13   Regalado, Belkys A, MD  albuterol (PROAIR HFA) 108 (90 Base) MCG/ACT inhaler INHALE 2 PUFFS INTO THE LUNGS EVERY 6 HOURS AS NEEDED FOR WHEEZING. 04/13/18   Charlott Rakes, MD  amLODipine (NORVASC) 10 MG tablet Take 1 tablet (10 mg total) by mouth daily. 10/20/18   Charlott Rakes, MD  atorvastatin  (LIPITOR) 40 MG tablet Take 1 tablet (40 mg total) by mouth daily. 10/20/18   Charlott Rakes, MD  baclofen (LIORESAL) 10 MG tablet Take 1 tablet (10 mg total) by mouth 2 (two) times daily as needed for muscle spasms. 10/20/18   Charlott Rakes, MD  clopidogrel (PLAVIX) 75 MG tablet Take 1 tablet (75 mg total) by mouth daily. 10/20/18   Charlott Rakes, MD  diphenhydrAMINE (BENADRYL) 25 MG tablet Take 50 mg by mouth at bedtime.    [provider]  fluticasone (FLOVENT HFA) 44 MCG/ACT inhaler Inhale 2 puffs into the lungs 2 (two) times daily. 10/09/17   Charlott Rakes, MD  ibuprofen (ADVIL,MOTRIN) 600 MG tablet Take 1 tablet (600 mg total) by mouth every 8 (eight) hours as needed. For painful menses 09/15/16   Boykin Nearing, MD  losartan (COZAAR) 100 MG tablet Take 1 tablet (100 mg total) by mouth daily. 10/20/18   Charlott Rakes, MD  metroNIDAZOLE (FLAGYL) 500 MG tablet Take 4 tablets (2,000 mg total) by mouth once for 1 dose. 10/27/18 10/27/18  Argentina Donovan, PA-C  Multiple Vitamin (MULTIVITAMIN WITH MINERALS) TABS tablet Take 1 tablet by mouth daily.    [provider]  pantoprazole (PROTONIX) 40 MG tablet TAKE 1 TABLET BY MOUTH prn 10/20/18   Charlott Rakes, MD  polyethylene glycol (MIRALAX / GLYCOLAX) packet Take 17 g by mouth daily. 10/20/18   Charlott Rakes, MD  triamcinolone ointment (KENALOG) 0.5 %  Apply 1 application topically 2 (two) times daily. 07/31/15   Funches, Adriana Mccallum, MD     Objective:  EXAM:   Vitals:   10/27/18 1139  BP: (!) 155/95  Pulse: (!) 110  Resp: 16  Temp: 98 F (36.7 C)  TempSrc: Oral  SpO2: 99%  Weight: 188 lb 3.2 oz (85.4 kg)    General appearance : A&OX3. NAD. Non-toxic-appearing HEENT: Atraumatic and Normocephalic.  PERRLA. EOM intact.  Neck: supple, no JVD. No cervical lymphadenopathy. No thyromegaly Chest/Lungs:  Breathing-non-labored, Good air entry bilaterally, breath sounds normal without rales, rhonchi, or wheezing  CVS: S1 S2  regular, no murmurs, gallops, rubs   Extremities: Bilateral Lower Ext shows no edema, both legs are warm to touch with = pulse throughout Neurology:  CN II-XII grossly intact, Non focal.   Psych:  TP linear. J/I fair. Normal speech. Appropriate eye contact and affect.  Skin:  No Rash  Data Review Lab Results  Component Value Date   HGBA1C 6.3 10/20/2018   HGBA1C 5.8 07/21/2018   HGBA1C 6.0 04/13/2018     Assessment & Plan   1. Exposure to sexually transmitted disease (STD) Will cover for trich with 2g metronidazole stat.  Condoms always, STD education/prevention discussed.   - POCT URINALYSIS DIP (CLINITEK) - Urine cytology ancillary only - HIV antibody (with reflex) - RPR  2. Essential hypertension Uncontrolled but only recently increased dose.  Check BP OOO 3-5 times/ week.  If not <135/85 in 3 weeks RTC.    Patient have been counseled extensively about nutrition and exercise  Return for 01/26/19 appt with Dr Margarita Rana.  The patient was given clear instructions to go to ER or return to medical center if symptoms don't improve, worsen or new problems develop. The patient verbalized understanding. The patient was told to call to get lab results if they haven't heard anything in the next week.     Freeman Caldron, PA-C [facility] and [place], [place]   10/27/2018, 1:34 PM

## 2018-10-28 LAB — HIV ANTIBODY (ROUTINE TESTING W REFLEX): HIV Screen 4th Generation wRfx: NONREACTIVE

## 2018-10-28 LAB — RPR: RPR Ser Ql: NONREACTIVE

## 2018-10-29 LAB — URINE CYTOLOGY ANCILLARY ONLY: Candida vaginitis: NEGATIVE

## 2018-10-30 LAB — URINE CYTOLOGY ANCILLARY ONLY
Chlamydia: NEGATIVE
NEISSERIA GONORRHEA: NEGATIVE
Trichomonas: NEGATIVE

## 2018-11-01 ENCOUNTER — Telehealth: Payer: Self-pay | Admitting: Family Medicine

## 2018-11-01 NOTE — Telephone Encounter (Signed)
Patient contacted via phone to be given results of labs.  Patient identified by name and date of birth.  Patient given results of labs.  Patient educated on lab results. Questions answered. Patient acknowledged understanding of labs results. 

## 2018-11-01 NOTE — Telephone Encounter (Signed)
Patient called requesting her lab results. Please f/u  

## 2018-11-12 ENCOUNTER — Encounter: Payer: Self-pay | Admitting: Podiatry

## 2018-11-12 ENCOUNTER — Other Ambulatory Visit: Payer: Self-pay

## 2018-11-12 ENCOUNTER — Ambulatory Visit: Payer: Medicare HMO | Admitting: Podiatry

## 2018-11-12 VITALS — Temp 98.1°F

## 2018-11-12 DIAGNOSIS — B351 Tinea unguium: Secondary | ICD-10-CM

## 2018-11-12 DIAGNOSIS — I69354 Hemiplegia and hemiparesis following cerebral infarction affecting left non-dominant side: Secondary | ICD-10-CM

## 2018-11-12 DIAGNOSIS — E119 Type 2 diabetes mellitus without complications: Secondary | ICD-10-CM | POA: Diagnosis not present

## 2018-11-15 ENCOUNTER — Encounter: Payer: Self-pay | Admitting: Podiatry

## 2018-11-15 MED FILL — ATORVASTATIN CALCIUM 40 MG: 40 | 30 days supply | Qty: 30 | Fill #1

## 2018-11-15 MED FILL — LOSARTAN POTASSIUM 100 MG T: 100 | 30 days supply | Qty: 30 | Fill #3

## 2018-11-15 MED FILL — CLOPIDOGREL 75 MG TABLET: 75 | 30 days supply | Qty: 30 | Fill #3

## 2018-11-15 MED FILL — AMLODIPINE BESYLATE 10 MG T: 10 | 30 days supply | Qty: 30 | Fill #1

## 2018-11-15 NOTE — Progress Notes (Signed)
Subjective: Rebecca Yoder presents for diabetic foot examination on today. She has h/o muliple TIA's. First TIA at age 53 and second/3rd TIAs 3-4 years later. She then relates massive CVA in 2016, which left her with left sided weakness and some speech .    Patient has brought paperwork in with her from [facility] for diabetic shoes.  Charlott Rakes, MD is her PCP. Last visit 10/20/2018.   Current Outpatient Medications:  .  acetaminophen (TYLENOL) 325 MG tablet, Take 2 tablets (650 mg total) by mouth every 6 (six) hours as needed., Disp: 20 tablet, Rfl: 0 .  albuterol (PROAIR HFA) 108 (90 Base) MCG/ACT inhaler, INHALE 2 PUFFS INTO THE LUNGS EVERY 6 HOURS AS NEEDED FOR WHEEZING., Disp: 8.5 g, Rfl: 1 .  amLODipine (NORVASC) 10 MG tablet, Take 1 tablet (10 mg total) by mouth daily., Disp: 30 tablet, Rfl: 6 .  atorvastatin (LIPITOR) 40 MG tablet, Take 1 tablet (40 mg total) by mouth daily., Disp: 30 tablet, Rfl: 6 .  baclofen (LIORESAL) 10 MG tablet, Take 1 tablet (10 mg total) by mouth 2 (two) times daily as needed for muscle spasms., Disp: 60 tablet, Rfl: 6 .  clopidogrel (PLAVIX) 75 MG tablet, Take 1 tablet (75 mg total) by mouth daily., Disp: 30 tablet, Rfl: 6 .  diphenhydrAMINE (BENADRYL) 25 MG tablet, Take 50 mg by mouth at bedtime., Disp: , Rfl:  .  fluticasone (FLOVENT HFA) 44 MCG/ACT inhaler, Inhale 2 puffs into the lungs 2 (two) times daily., Disp: 1 Inhaler, Rfl: 5 .  ibuprofen (ADVIL,MOTRIN) 600 MG tablet, Take 1 tablet (600 mg total) by mouth every 8 (eight) hours as needed. For painful menses, Disp: 30 tablet, Rfl: 0 .  losartan (COZAAR) 100 MG tablet, Take 1 tablet (100 mg total) by mouth daily., Disp: 30 tablet, Rfl: 6 .  Multiple Vitamin (MULTIVITAMIN WITH MINERALS) TABS tablet, Take 1 tablet by mouth daily., Disp: , Rfl:  .  pantoprazole (PROTONIX) 40 MG tablet, TAKE 1 TABLET BY MOUTH prn, Disp: 30 tablet, Rfl: 6 .  polyethylene glycol (MIRALAX / GLYCOLAX) packet, Take 17 g  by mouth daily., Disp: 30 each, Rfl: 1 .  triamcinolone ointment (KENALOG) 0.5 %, Apply 1 application topically 2 (two) times daily., Disp: 30 g, Rfl: 0  Allergies  Allergen Reactions  . Penicillins Rash   Objective:  Vascular Examination: Capillary refill time immediate x 10 digits  Dorsalis pedis and Posterior tibial pulses palpable b/l  Digital hair sparse x 10 digits  Skin temperature gradient WNL b/l  Dermatological Examination: Skin with normal turgor, texture and tone b/l  Toenails 1-5 b/l discolored, thick, dystrophic with subungual debris and pain with palpation to nailbeds due to thickness of nails.  Musculoskeletal: Muscle strength 5/5 to all LE muscle groups right LE  Flaccid LE left.  Below knee AFO LLE.  No gross bony deformities b/l.  No pain, crepitus or joint limitation noted with ROM.   Neurological: Sensation intact 5/5 with 10 gram monofilament RLE; 3/5 LLE.  Vibratory sensation intact RLE; decreased LLE.  Assessment: Painful onychomycosis toenails 1-5 b/l  Hemiplegia LLE with spasticity NIDDM with neuropathy LLE  Plan: 1. Toenails 1-5 b/l were debrided in length and girth without iatrogenic bleeding. 2. Paperwork completed for diabetic shoes on today. Explained Certification for diabetic shoes will need to be completed by Dr. Margarita Rana. She related understanding. 3. Patient to continue soft, supportive shoe gear daily. 4. Patient to report any pedal injuries to medical professional immediately. 5.  Follow up 3 months.  6. Patient/POA to call should there be a concern in the interim.

## 2018-12-11 MED FILL — LOSARTAN POTASSIUM 100 MG T: 100 | 30 days supply | Qty: 30 | Fill #4

## 2018-12-11 MED FILL — PANTOPRAZOLE SOD DR 40 MG T: 40 | 30 days supply | Qty: 30 | Fill #1

## 2018-12-11 MED FILL — ALBUTEROL SULFATE HFA 108 (: 108 (90 BAS | 25 days supply | Qty: 18 | Fill #0

## 2018-12-11 MED FILL — CLOPIDOGREL 75 MG TABLET: 75 | 30 days supply | Qty: 30 | Fill #4

## 2018-12-11 MED FILL — ATORVASTATIN CALCIUM 40 MG: 40 | 30 days supply | Qty: 30 | Fill #2

## 2018-12-11 MED FILL — AMLODIPINE BESYLATE 10 MG T: 10 | 30 days supply | Qty: 30 | Fill #2

## 2018-12-16 ENCOUNTER — Other Ambulatory Visit: Payer: Self-pay

## 2018-12-16 ENCOUNTER — Encounter: Payer: Medicare HMO | Attending: Physical Medicine & Rehabilitation | Admitting: Physical Medicine & Rehabilitation

## 2018-12-16 ENCOUNTER — Encounter: Payer: Self-pay | Admitting: Physical Medicine & Rehabilitation

## 2018-12-16 VITALS — BP 150/80 | HR 113 | Temp 98.3°F | Wt 190.2 lb

## 2018-12-16 DIAGNOSIS — E785 Hyperlipidemia, unspecified: Secondary | ICD-10-CM | POA: Diagnosis not present

## 2018-12-16 DIAGNOSIS — G8194 Hemiplegia, unspecified affecting left nondominant side: Secondary | ICD-10-CM | POA: Diagnosis present

## 2018-12-16 DIAGNOSIS — I69354 Hemiplegia and hemiparesis following cerebral infarction affecting left non-dominant side: Secondary | ICD-10-CM | POA: Insufficient documentation

## 2018-12-16 DIAGNOSIS — I10 Essential (primary) hypertension: Secondary | ICD-10-CM | POA: Diagnosis not present

## 2018-12-16 DIAGNOSIS — R269 Unspecified abnormalities of gait and mobility: Secondary | ICD-10-CM | POA: Insufficient documentation

## 2018-12-16 DIAGNOSIS — M25512 Pain in left shoulder: Secondary | ICD-10-CM | POA: Insufficient documentation

## 2018-12-16 DIAGNOSIS — M7502 Adhesive capsulitis of left shoulder: Secondary | ICD-10-CM | POA: Insufficient documentation

## 2018-12-16 DIAGNOSIS — Z79899 Other long term (current) drug therapy: Secondary | ICD-10-CM | POA: Insufficient documentation

## 2018-12-16 DIAGNOSIS — G8110 Spastic hemiplegia affecting unspecified side: Secondary | ICD-10-CM

## 2018-12-16 DIAGNOSIS — M7542 Impingement syndrome of left shoulder: Secondary | ICD-10-CM | POA: Diagnosis not present

## 2018-12-16 DIAGNOSIS — S43002A Unspecified subluxation of left shoulder joint, initial encounter: Secondary | ICD-10-CM | POA: Insufficient documentation

## 2018-12-16 DIAGNOSIS — Z8673 Personal history of transient ischemic attack (TIA), and cerebral infarction without residual deficits: Secondary | ICD-10-CM | POA: Diagnosis present

## 2018-12-16 NOTE — Patient Instructions (Addendum)
Will ask [facility] to adjust your brace  Please bring both braces to your next clinic visit

## 2018-12-16 NOTE — Progress Notes (Signed)
  Botox Injection for spasticity using needle EMG guidance  Dilution: 50 Units/ml Indication: Severe spasticity which interferes with ADL,mobility and/or  hygiene and is unresponsive to medication management and other conservative care Informed consent was obtained after describing risks and benefits of the procedure with the patient. This includes bleeding, bruising, infection, excessive weakness, or medication side effects. A REMS form is on file and signed. Needle: 61mm 25g needle electrode Number of units per muscle Posterior tibialis 75 Gastroc medial 75  FHL 50 FDL 100 All injections were done after obtaining appropriate EMG activity and after negative drawback for blood. The patient tolerated the procedure well. Post procedure instructions were given. A followup appointment was made.   We spoke briefly about her new AFO which the pt states has caused swelling of her left foot.  We discussed that the polypropylene material can be molded or cut to make further adjustments and that the design does allow for better control of foot inversion  She will bring in both AFOs for the next visit in June

## 2018-12-27 ENCOUNTER — Ambulatory Visit (HOSPITAL_BASED_OUTPATIENT_CLINIC_OR_DEPARTMENT_OTHER): Payer: Medicare HMO | Admitting: Family Medicine

## 2018-12-27 ENCOUNTER — Other Ambulatory Visit: Payer: Self-pay

## 2018-12-27 ENCOUNTER — Encounter: Payer: Self-pay | Admitting: Family Medicine

## 2018-12-27 ENCOUNTER — Other Ambulatory Visit (HOSPITAL_COMMUNITY)
Admission: RE | Admit: 2018-12-27 | Discharge: 2018-12-27 | Disposition: A | Discharge status: Home / Self Care | Payer: Medicare HMO | Source: Ambulatory Visit | Attending: Family Medicine | Admitting: Family Medicine

## 2018-12-27 VITALS — BP 143/90 | HR 85 | Temp 97.5°F | Wt 190.0 lb

## 2018-12-27 DIAGNOSIS — R399 Unspecified symptoms and signs involving the genitourinary system: Secondary | ICD-10-CM | POA: Diagnosis not present

## 2018-12-27 DIAGNOSIS — E119 Type 2 diabetes mellitus without complications: Secondary | ICD-10-CM

## 2018-12-27 DIAGNOSIS — N3001 Acute cystitis with hematuria: Secondary | ICD-10-CM | POA: Diagnosis not present

## 2018-12-27 DIAGNOSIS — I10 Essential (primary) hypertension: Secondary | ICD-10-CM | POA: Diagnosis not present

## 2018-12-27 LAB — POCT URINALYSIS DIP (CLINITEK)
Bilirubin, UA: NEGATIVE
Glucose, UA: NEGATIVE mg/dL
Ketones, POC UA: NEGATIVE mg/dL
Nitrite, UA: NEGATIVE
POC PROTEIN,UA: NEGATIVE
Spec Grav, UA: 1.015 (ref 1.010–1.025)
Urobilinogen, UA: 0.2 E.U./dL
pH, UA: 6.5 (ref 5.0–8.0)

## 2018-12-27 LAB — GLUCOSE, POCT (MANUAL RESULT ENTRY): POC Glucose: 80 mg/dl (ref 70–99)

## 2018-12-27 MED ORDER — CIPROFLOXACIN HCL 500 MG PO TABS: 500.0000 mg | ORAL_TABLET | Freq: Two times a day (BID) | ORAL | 0 refills | Status: DC

## 2018-12-27 MED ORDER — CARVEDILOL 3.125 MG PO TABS: 3.1250 mg | ORAL_TABLET | Freq: Two times a day (BID) | ORAL | 3 refills | Status: DC

## 2018-12-27 MED FILL — CARVEDILOL 3.125 MG TABLET: 3.125 | 30 days supply | Qty: 60 | Fill #0

## 2018-12-27 MED FILL — CIPROFLOXACIN HCL 500 MG TA: 500 | 3 days supply | Qty: 6 | Fill #0

## 2018-12-27 NOTE — Progress Notes (Signed)
Subjective:  Patient ID: Rebecca Yoder, female    DOB: March 17, 1966  Age: 53 y.o. MRN: [id number]  CC: Diabetes   HPI Rebecca Yoder s a 53 year old female with history of type 2 diabetes mellitus (A1c 6.3),hypertension, right middle cerebral artery stroke with left spastic hemiparesis, GERD, asthma who presents today for an acute visit.   She complains of vaginal spotting after sexual intercourse with her boyfriend and is wondering if the bacterial infection she had 2 months ago has not cleared.  She had seen the PA after her boyfriend had tested positive for trichomonas and STD test had revealed presence of bacterial vaginosis for which she was treated with 2 g of metronidazole x1 dose which she had received presumptively at her office visit. She denies dysuria, vaginal discharge or itching and has no abdominal pain. She is postmenopausal and uses condoms during sexual intercourse.  Last Pap smear from 06/2017 revealed normal cytology, normal HPV.  Blood pressure is elevated today and was also elevated at her visit with and she endorses compliance with her antihypertensives.  Past Medical History:  Diagnosis Date   ASTHMA 12/10/2009   CEREBROVASCULAR ACCIDENT, HX OF 01/17/1156   Complication of anesthesia    "just can't get me up" (26/20/3559)   Embolic stroke of right basal ganglia ([place]) 06/11/2015   with L residual weakness/hemiparesis    HYPERLIPIDEMIA 12/10/2009   HYPERTENSION 12/10/2009   Stroke ([place]) 2008   denies residual on 06/09/2013   Tendonitis of wrist, right     Past Surgical History:  Procedure Laterality Date   COSMETIC SURGERY     OVARIAN CYST REMOVAL  1990's    Family History  Problem Relation Age of Onset   Multiple myeloma Mother     Allergies  Allergen Reactions   Penicillins Rash    Outpatient Medications Prior to Visit  Medication Sig Dispense Refill   acetaminophen (TYLENOL) 325 MG tablet Take 2 tablets (650 mg total)  by mouth every 6 (six) hours as needed. 20 tablet 0   albuterol (PROAIR HFA) 108 (90 Base) MCG/ACT inhaler INHALE 2 PUFFS INTO THE LUNGS EVERY 6 HOURS AS NEEDED FOR WHEEZING. 8.5 g 1   amLODipine (NORVASC) 10 MG tablet Take 1 tablet (10 mg total) by mouth daily. 30 tablet 6   atorvastatin (LIPITOR) 40 MG tablet Take 1 tablet (40 mg total) by mouth daily. 30 tablet 6   baclofen (LIORESAL) 10 MG tablet Take 1 tablet (10 mg total) by mouth 2 (two) times daily as needed for muscle spasms. 60 tablet 6   clopidogrel (PLAVIX) 75 MG tablet Take 1 tablet (75 mg total) by mouth daily. 30 tablet 6   diphenhydrAMINE (BENADRYL) 25 MG tablet Take 50 mg by mouth at bedtime.     fluticasone (FLOVENT HFA) 44 MCG/ACT inhaler Inhale 2 puffs into the lungs 2 (two) times daily. 1 Inhaler 5   ibuprofen (ADVIL,MOTRIN) 600 MG tablet Take 1 tablet (600 mg total) by mouth every 8 (eight) hours as needed. For painful menses 30 tablet 0   losartan (COZAAR) 100 MG tablet Take 1 tablet (100 mg total) by mouth daily. 30 tablet 6   Multiple Vitamin (MULTIVITAMIN WITH MINERALS) TABS tablet Take 1 tablet by mouth daily.     pantoprazole (PROTONIX) 40 MG tablet TAKE 1 TABLET BY MOUTH prn 30 tablet 6   polyethylene glycol (MIRALAX / GLYCOLAX) packet Take 17 g by mouth daily. 30 each 1   triamcinolone ointment (KENALOG)  0.5 % Apply 1 application topically 2 (two) times daily. 30 g 0   No facility-administered medications prior to visit.      ROS Review of Systems  Constitutional: Negative for activity change, appetite change and fatigue.  HENT: Negative for congestion, sinus pressure and sore throat.   Eyes: Negative for visual disturbance.  Respiratory: Negative for cough, chest tightness, shortness of breath and wheezing.   Cardiovascular: Negative for chest pain and palpitations.  Gastrointestinal: Negative for abdominal distention, abdominal pain and constipation.  Endocrine: Negative for polydipsia.    Genitourinary: Negative for dysuria and frequency.       Vaginal spotting post intercourse  Musculoskeletal: Negative for arthralgias and back pain.  Skin: Negative for rash.  Neurological: Negative for tremors, light-headedness and numbness.  Hematological: Does not bruise/bleed easily.  Psychiatric/Behavioral: Negative for agitation and behavioral problems.    Objective:  BP (!) 143/90    Pulse 85    Temp (!) 97.5 F (36.4 C) (Oral)    Wt 190 lb (86.2 kg)    SpO2 98%    BMI 31.14 kg/m   BP/Weight 12/27/2018 12/16/2018 2/37/6283  Systolic BP 151 761 607  Diastolic BP 90 80 95  Wt. (Lbs) 190 190.2 188.2  BMI 31.14 31.17 30.84      Physical Exam Constitutional:      Appearance: She is well-developed.  Cardiovascular:     Rate and Rhythm: Normal rate.     Heart sounds: Normal heart sounds. No murmur.  Pulmonary:     Effort: Pulmonary effort is normal.     Breath sounds: Normal breath sounds. No wheezing or rales.  Chest:     Chest wall: No tenderness.  Abdominal:     General: Bowel sounds are normal. There is no distension.     Palpations: Abdomen is soft. There is no mass.     Tenderness: There is no abdominal tenderness.  Musculoskeletal: Normal range of motion.  Neurological:     Mental Status: She is alert and oriented to person, place, and time.     CMP Latest Ref Rng & Units 10/26/2018 10/13/2017 05/04/2017  Glucose 65 - 99 mg/dL 100(H) 91 104(H)  BUN 6 - 24 mg/dL _0 Creatinine 0.57 - 1.00 mg/dL 0.85 0.85 0.78  Sodium 134 - 144 mmol/L 143 141 139  Potassium 3.5 - 5.2 mmol/L 4.5 3.6 3.8  Chloride 96 - 106 mmol/L 101 99 96  CO2 20 - 29 mmol/L _1 Calcium 8.7 - 10.2 mg/dL 9.9 9.8 10.0  Total Protein 6.0 - 8.5 g/dL 7.8 7.6 7.8  Total Bilirubin 0.0 - 1.2 mg/dL 0.3 0.3 <0.2  Alkaline Phos 39 - 117 IU/L 94 68 65  AST 0 - 40 IU/L _2 ALT 0 - 32 IU/L 33(H) 17 18    Lipid Panel     Component Value Date/Time   CHOL 108 10/26/2018 1112   TRIG 90  10/26/2018 1112   HDL 49 10/26/2018 1112   CHOLHDL 2.2 10/26/2018 1112   CHOLHDL 2.4 09/15/2016 1116   VLDL 22 09/15/2016 1116   LDLCALC 41 10/26/2018 1112    CBC    Component Value Date/Time   WBC 4.4 09/15/2016 1048   RBC 3.92 09/15/2016 1048   HGB 11.4 (L) 09/15/2016 1048   HCT 33.8 (L) 09/15/2016 1048   PLT 451 (H) 09/15/2016 1048   MCV 86.2 09/15/2016 1048   MCH 29.1 09/15/2016 1048   MCHC 33.7 09/15/2016  1048   RDW 14.8 09/15/2016 1048   LYMPHSABS 2.2 06/15/2015 0451   MONOABS 0.7 06/15/2015 0451   EOSABS 0.4 06/15/2015 0451   BASOSABS 0.0 06/15/2015 0451    Lab Results  Component Value Date   HGBA1C 6.3 10/20/2018    Assessment & Plan:   1. Controlled type 2 diabetes mellitus without complication, without long-term current use of insulin ([place]) Controlled with A1c of 6.3 from 10/2018 - POCT glucose (manual entry)  2.  Acute cystitis UA positive for blood and leukocyte esterase Placed on ciprofloxacin - POCT URINALYSIS DIP (CLINITEK) - Urine cytology ancillary only  3. Essential hypertension Blood pressure is slightly elevated above goal [place] Coreg Counseled on blood pressure goal of less than 130/80, low-sodium, DASH diet, medication compliance, 150 minutes of moderate intensity exercise per week. Discussed medication compliance, adverse effects. - carvedilol (COREG) 3.125 MG tablet; Take 1 tablet (3.125 mg total) by mouth 2 (two) times daily with a meal.  Dispense: 60 tablet; Refill: 3   Meds ordered this encounter  Medications   carvedilol (COREG) 3.125 MG tablet    Sig: Take 1 tablet (3.125 mg total) by mouth 2 (two) times daily with a meal.    Dispense:  60 tablet    Refill:  3    Follow-up: Return in about 3 months (around 03/29/2019) for Medical conditions; cancel previously existing appointment.       Charlott Rakes, MD, FAAFP. [facility] [place], [place]   12/27/2018, 2:17 PM

## 2018-12-27 NOTE — Progress Notes (Signed)
Patient is here for BV.

## 2018-12-28 LAB — URINE CYTOLOGY ANCILLARY ONLY
Chlamydia: NEGATIVE
Neisseria Gonorrhea: NEGATIVE
Trichomonas: NEGATIVE

## 2018-12-30 ENCOUNTER — Telehealth: Payer: Self-pay

## 2018-12-30 NOTE — Telephone Encounter (Signed)
-----   Message from Charlott Rakes, MD sent at 12/28/2018  4:38 PM EDT ----- Urine cytology is negative for chlamydia and gonorrhea and trichomonas

## 2018-12-30 NOTE — Telephone Encounter (Signed)
Patient name and DOB has been verified Patient was informed of lab results. Patient had no questions.  

## 2018-12-31 ENCOUNTER — Other Ambulatory Visit: Payer: Self-pay | Admitting: Internal Medicine

## 2018-12-31 LAB — URINE CYTOLOGY ANCILLARY ONLY: Candida vaginitis: NEGATIVE

## 2018-12-31 MED ORDER — METRONIDAZOLE 500 MG PO TABS: 500.0000 mg | ORAL_TABLET | Freq: Two times a day (BID) | ORAL | 0 refills | Status: DC

## 2018-12-31 MED FILL — metroNIDAZOLE 500 MG TABS: 500 | 7 days supply | Qty: 14 | Fill #0

## 2019-01-07 ENCOUNTER — Telehealth: Payer: Self-pay

## 2019-01-07 NOTE — Telephone Encounter (Signed)
-----   Message from Ladell Pier, MD sent at 12/31/2018  6:10 PM EDT ----- Let patient know that she tested positive for bacterial vaginosis.  This is a bacterial infection in the vagina.  I have sent her results to her via [place].  I have sent a prescription to our pharmacy for the antibiotic Flagyl.

## 2019-01-07 NOTE — Telephone Encounter (Signed)
Patient name and DOB has been verified Patient was informed of lab results. Patient had no questions.  

## 2019-01-17 MED FILL — AMLODIPINE BESYLATE 10 MG T: 10 | 30 days supply | Qty: 30 | Fill #3

## 2019-01-17 MED FILL — PANTOPRAZOLE SOD DR 40 MG T: 40 | 30 days supply | Qty: 30 | Fill #2

## 2019-01-17 MED FILL — ATORVASTATIN CALCIUM 40 MG: 40 | 30 days supply | Qty: 30 | Fill #0

## 2019-01-17 MED FILL — CLOPIDOGREL 75 MG TABLET: 75 | 30 days supply | Qty: 30 | Fill #5

## 2019-01-24 MED FILL — CARVEDILOL 3.125 MG TABLET: 3.125 | 30 days supply | Qty: 60 | Fill #1

## 2019-01-24 MED FILL — LOSARTAN POTASSIUM 100 MG T: 100 | 30 days supply | Qty: 30 | Fill #5

## 2019-01-26 ENCOUNTER — Ambulatory Visit: Payer: Medicare HMO | Admitting: Family Medicine

## 2019-01-27 ENCOUNTER — Encounter: Payer: Medicare HMO | Attending: Physical Medicine & Rehabilitation | Admitting: Physical Medicine & Rehabilitation

## 2019-01-27 ENCOUNTER — Encounter: Payer: Self-pay | Admitting: Physical Medicine & Rehabilitation

## 2019-01-27 ENCOUNTER — Other Ambulatory Visit: Payer: Self-pay

## 2019-01-27 DIAGNOSIS — G8110 Spastic hemiplegia affecting unspecified side: Secondary | ICD-10-CM | POA: Diagnosis not present

## 2019-01-27 DIAGNOSIS — M7542 Impingement syndrome of left shoulder: Secondary | ICD-10-CM | POA: Insufficient documentation

## 2019-01-27 DIAGNOSIS — M7502 Adhesive capsulitis of left shoulder: Secondary | ICD-10-CM | POA: Diagnosis not present

## 2019-01-27 DIAGNOSIS — S43002A Unspecified subluxation of left shoulder joint, initial encounter: Secondary | ICD-10-CM | POA: Diagnosis not present

## 2019-01-27 DIAGNOSIS — E785 Hyperlipidemia, unspecified: Secondary | ICD-10-CM | POA: Diagnosis not present

## 2019-01-27 DIAGNOSIS — Z79899 Other long term (current) drug therapy: Secondary | ICD-10-CM | POA: Insufficient documentation

## 2019-01-27 DIAGNOSIS — G8194 Hemiplegia, unspecified affecting left nondominant side: Secondary | ICD-10-CM | POA: Diagnosis present

## 2019-01-27 DIAGNOSIS — Z8673 Personal history of transient ischemic attack (TIA), and cerebral infarction without residual deficits: Secondary | ICD-10-CM | POA: Diagnosis present

## 2019-01-27 DIAGNOSIS — R269 Unspecified abnormalities of gait and mobility: Secondary | ICD-10-CM | POA: Insufficient documentation

## 2019-01-27 DIAGNOSIS — M25512 Pain in left shoulder: Secondary | ICD-10-CM | POA: Diagnosis not present

## 2019-01-27 DIAGNOSIS — I69354 Hemiplegia and hemiparesis following cerebral infarction affecting left non-dominant side: Secondary | ICD-10-CM | POA: Diagnosis not present

## 2019-01-27 DIAGNOSIS — I10 Essential (primary) hypertension: Secondary | ICD-10-CM | POA: Insufficient documentation

## 2019-01-27 NOTE — Progress Notes (Signed)
Subjective:    Patient ID: Rebecca Yoder, female    DOB: 04-21-1966, 53 y.o.   MRN: [id number] 53 year old right-handed female with a history of hypertension, previous right brain CVA 2014, remote right frontal insular infarct 2008 with known PFO maintained on aspirin. Lives with her sister and mother who has cancer.  Presented June 11, 2015 with sudden onset of left-sided weakness, slurred speech, right gaze preference.  Cranial CT scan negative.  The patient did receive tPA.  CT of the head and neck showed occluded M1 segment of the right middle cerebral artery that appeared to have progressed since prior MR angiogram.  MRI of the brain, June 13, 2015 showed progressive nonhemorrhagic infarct involving the right basal ganglia.  Remote anterior right MCA territory infarct involving the insular cortex. Echocardiogram with ejection fraction of 70%, no wall motion abnormalities.  Venous Doppler of the lower extremities negative. Neurology consulted, maintained on Plavix therapy for CVA prophylaxis. HPI  53 year old female with chronic left spastic hemiplegia.   Patient follows up after botulinum toxin injection performed on 12/16/2018 Posterior tibialis 75 Gastroc medial 75  FHL 50 FDL 100 Patient feels like she did well with this injection.  Her main complaint is that her left great toe extends autonomously and interferes with donning her socks and shoes. She has had no falls.  She remains independent with all her self-care and mobility with exception of tying her shoes.  Patient has some concerns about left lower extremity swelling.  We discussed that the week limb tends to swell and she has bought Jobst stockings to help with this. We discussed limiting salt intake. In addition the patient feels that her swelling got worse after her primary physician changed the amlodipine from 5 mg to 10 mg/day. Patient also had some questions about her carvedilol and stated that she was  taking 2 tablets in the morning rather than 1 tablet twice a day.  We discussed that that medication should be taking twice a day instead of once a day.  We also discussed that she can talk to her primary physician about this. Pain Inventory Average Pain 0 Pain Right Now 0 My pain is sharp  In the last 24 hours, has pain interfered with the following? General activity 9 Relation with others 7 Enjoyment of life 10 What TIME of day is your pain at its worst? daytime Sleep (in general) Fair  Pain is worse with: walking Pain improves with: rest and therapy/exercise Relief from Meds: 10  Mobility use a cane ability to climb steps?  no do you drive?  no  Function disabled: date disabled 2016  Neuro/Psych trouble walking  Prior Studies Any changes since last visit?  no  Physicians involved in your care Any changes since last visit?  no   Family History  Problem Relation Age of Onset  . Multiple myeloma Mother    Social History   Socioeconomic History  . Marital status: Single    Spouse name: Not on file  . Number of children: 0  . Years of education: 12th  . Highest education level: Not on file  Occupational History  . Occupation: [employer]    Fish farm manager: [employer]  Social Needs  . Financial resource strain: Not on file  . Food insecurity    Worry: Not on file    Inability: Not on file  . Transportation needs    Medical: Not on file    Non-medical: Not on file  Tobacco Use  .  Smoking status: Never Smoker  . Smokeless tobacco: Never Used  Substance and Sexual Activity  . Alcohol use: No    Alcohol/week: 0.0 standard drinks  . Drug use: No  . Sexual activity: Yes  Lifestyle  . Physical activity    Days per week: Not on file    Minutes per session: Not on file  . Stress: Not on file  Relationships  . Social Herbalist on phone: Not on file    Gets together: Not on file    Attends religious service: Not on file    Active member of club or  organization: Not on file    Attends meetings of clubs or organizations: Not on file    Relationship status: Not on file  Other Topics Concern  . Not on file  Social History Narrative  . Not on file   Past Surgical History:  Procedure Laterality Date  . COSMETIC SURGERY    . OVARIAN CYST REMOVAL  1990's   Past Medical History:  Diagnosis Date  . ASTHMA 12/10/2009  . CEREBROVASCULAR ACCIDENT, HX OF 12/10/2009  . Complication of anesthesia    "just can't get me up" (06/09/2013)  . Embolic stroke of right basal ganglia ([place]) 06/11/2015   with L residual weakness/hemiparesis   . HYPERLIPIDEMIA 12/10/2009  . HYPERTENSION 12/10/2009  . Stroke [facility]) 2008   denies residual on 06/09/2013  . Tendonitis of wrist, right    There were no vitals taken for this visit.  Opioid Risk Score:   Fall Risk Score:  `1  Depression screen PHQ 2/9  Depression screen [facility] 2/9 10/27/2018 10/20/2018 04/13/2018 01/26/2018 12/23/2017 10/09/2017 07/07/2017  Decreased Interest '3 3 3 3 1 3 3  ' Down, Depressed, Hopeless 0 0 0 1 0 1 0  PHQ - 2 Score '3 3 3 4 1 4 3  ' Altered sleeping '3 3 3 ' - 2 0 0  Tired, decreased energy '2 2 2 ' - 3 0 3  Change in appetite 0 0 0 - 3 0 0  Feeling bad or failure about yourself  0 1 0 - 0 0 0  Trouble concentrating 2 2 0 - 2 0 0  Moving slowly or fidgety/restless 0 0 0 - 2 0 0  Suicidal thoughts 0 0 0 - 0 0 0  PHQ-9 Score '10 11 8 ' - '13 4 6  ' Some recent data might be hidden     Review of Systems  Constitutional: Negative.   HENT: Negative.   Eyes: Negative.   Respiratory: Negative.   Cardiovascular: Negative.   Gastrointestinal: Negative.   Endocrine: Negative.   Genitourinary: Negative.   Musculoskeletal: Positive for gait problem.  Skin: Negative.   Allergic/Immunologic: Negative.   Hematological: Negative.   Psychiatric/Behavioral: Negative.   All other systems reviewed and are negative.      Objective:   Physical Exam Vitals signs and nursing note reviewed.  HENT:      Head: Normocephalic and atraumatic.     Mouth/Throat:     Mouth: Mucous membranes are moist.  Eyes:     Extraocular Movements: Extraocular movements intact.     Conjunctiva/sclera: Conjunctivae normal.     Pupils: Pupils are equal, round, and reactive to light.  Skin:    General: Skin is warm and dry.  Neurological:     General: No focal deficit present.     Mental Status: She is alert and oriented to person, place, and time.     Motor: Motor  function is intact.     Gait: Gait abnormal.     Comments: Ambulates with AFO, does have a tendency towards knee hyperextension. Left upper limb has 4- at the deltoid bicep tricep finger flexors and extensors she has poor motor control and decreased fine motor coordination. Left lower extremity is 4- hip flexion knee extension and trace ankle dorsiflexion.  Tone left lower extremity with hyperactive Babinski Left toe flexors MAS 1 With standing she does have toe curling of all 5 toes.  This is moderate. There is no evidence of supination at the foot and ankle during standing.  There is some supination without her AFO during swing phase  Psychiatric:        Mood and Affect: Mood normal.           Assessment & Plan:  1.  History of multiple strokes with persistent left spastic hemiparesis. Overall doing well from a functional standpoint She has had a good result with her Botox injection but is noting some increased great toe extension when she is not weightbearing.  Will make following adjustments to her next botulinum toxin injection which will be in approximately 6 weeks LEFT Posterior tibialis 75 Gastroc medial 75  FHL 50 FDL 75 EHL 25

## 2019-02-01 ENCOUNTER — Ambulatory Visit: Payer: Medicare HMO | Admitting: Family Medicine

## 2019-02-14 MED FILL — ATORVASTATIN CALCIUM 40 MG: 40 | 30 days supply | Qty: 30 | Fill #1

## 2019-02-14 MED FILL — AMLODIPINE BESYLATE 10 MG T: 10 | 30 days supply | Qty: 30 | Fill #4

## 2019-02-14 MED FILL — CLOPIDOGREL 75 MG TABLET: 75 | 30 days supply | Qty: 30 | Fill #6

## 2019-02-15 ENCOUNTER — Ambulatory Visit: Payer: Medicare HMO | Admitting: Podiatry

## 2019-02-17 ENCOUNTER — Telehealth: Payer: Self-pay | Admitting: Family Medicine

## 2019-02-17 NOTE — Telephone Encounter (Signed)
Patient was called and given an in person appointment for her dental clearance.

## 2019-02-17 NOTE — Telephone Encounter (Signed)
Patient called stating she has having a dental procedure done and is needing to get medical clearance. Please follow up.

## 2019-02-18 MED FILL — CARVEDILOL 3.125 MG TABLET: 3.125 | 30 days supply | Qty: 60 | Fill #2

## 2019-02-21 MED FILL — LOSARTAN POTASSIUM 100 MG T: 100 | 30 days supply | Qty: 30 | Fill #6

## 2019-02-22 ENCOUNTER — Encounter: Payer: Self-pay | Admitting: Family Medicine

## 2019-02-22 ENCOUNTER — Ambulatory Visit: Payer: Medicare HMO | Attending: Family Medicine | Admitting: Family Medicine

## 2019-02-22 ENCOUNTER — Other Ambulatory Visit: Payer: Self-pay

## 2019-02-22 VITALS — BP 132/84 | HR 89 | Wt 189.0 lb

## 2019-02-22 DIAGNOSIS — E119 Type 2 diabetes mellitus without complications: Secondary | ICD-10-CM

## 2019-02-22 DIAGNOSIS — Z01818 Encounter for other preprocedural examination: Secondary | ICD-10-CM

## 2019-02-22 LAB — POCT GLYCOSYLATED HEMOGLOBIN (HGB A1C): HbA1c, POC (controlled diabetic range): 6.4 % (ref 0.0–7.0)

## 2019-02-22 NOTE — Progress Notes (Signed)
Subjective:  Patient ID: Rebecca Yoder, female    DOB: Sep 14, 1965  Age: 53 y.o. MRN: [id number]  CC: Diabetes   HPI Rebecca Yoder is a 53 year old female with history of type 2 diabetes mellitus (A1c 6.4),hypertension, right middle cerebral artery stroke with left spastic hemiparesis, GERD, asthma who presents today for a clearance prior to oral surgery. She is currently on Plavix and her oral surgeon would like recommendations as to management of Plavix. She is on Plavix for anticoagulation post CVA in 2016 but prior to that she had had previous TIAs.  She has no bruising or bleeding with Plavix.  She also has a form from her podiatrist which she would like completed to enable her to obtain diabetic shoes. She has no additional concerns today  Past Medical History:  Diagnosis Date  . ASTHMA 12/10/2009  . CEREBROVASCULAR ACCIDENT, HX OF 12/10/2009  . Complication of anesthesia    "just can't get me up" (06/09/2013)  . Embolic stroke of right basal ganglia ([place]) 06/11/2015   with L residual weakness/hemiparesis   . HYPERLIPIDEMIA 12/10/2009  . HYPERTENSION 12/10/2009  . Stroke [facility]) 2008   denies residual on 06/09/2013  . Tendonitis of wrist, right     Past Surgical History:  Procedure Laterality Date  . COSMETIC SURGERY    . OVARIAN CYST REMOVAL  1990's    Family History  Problem Relation Age of Onset  . Multiple myeloma Mother     Allergies  Allergen Reactions  . Penicillins Rash    Outpatient Medications Prior to Visit  Medication Sig Dispense Refill  . acetaminophen (TYLENOL) 325 MG tablet Take 2 tablets (650 mg total) by mouth every 6 (six) hours as needed. 20 tablet 0  . albuterol (PROAIR HFA) 108 (90 Base) MCG/ACT inhaler INHALE 2 PUFFS INTO THE LUNGS EVERY 6 HOURS AS NEEDED FOR WHEEZING. 8.5 g 1  . amLODipine (NORVASC) 10 MG tablet Take 1 tablet (10 mg total) by mouth daily. 30 tablet 6  . atorvastatin (LIPITOR) 40 MG tablet Take 1 tablet (40 mg total) by mouth  daily. 30 tablet 6  . baclofen (LIORESAL) 10 MG tablet Take 1 tablet (10 mg total) by mouth 2 (two) times daily as needed for muscle spasms. 60 tablet 6  . carvedilol (COREG) 3.125 MG tablet Take 1 tablet (3.125 mg total) by mouth 2 (two) times daily with a meal. 60 tablet 3  . clopidogrel (PLAVIX) 75 MG tablet Take 1 tablet (75 mg total) by mouth daily. 30 tablet 6  . diphenhydrAMINE (BENADRYL) 25 MG tablet Take 50 mg by mouth at bedtime.    . fluticasone (FLOVENT HFA) 44 MCG/ACT inhaler Inhale 2 puffs into the lungs 2 (two) times daily. 1 Inhaler 5  . ibuprofen (ADVIL,MOTRIN) 600 MG tablet Take 1 tablet (600 mg total) by mouth every 8 (eight) hours as needed. For painful menses 30 tablet 0  . losartan (COZAAR) 100 MG tablet Take 1 tablet (100 mg total) by mouth daily. 30 tablet 6  . Multiple Vitamin (MULTIVITAMIN WITH MINERALS) TABS tablet Take 1 tablet by mouth daily.    . pantoprazole (PROTONIX) 40 MG tablet TAKE 1 TABLET BY MOUTH prn 30 tablet 6  . polyethylene glycol (MIRALAX / GLYCOLAX) packet Take 17 g by mouth daily. 30 each 1  . triamcinolone ointment (KENALOG) 0.5 % Apply 1 application topically 2 (two) times daily. 30 g 0  . ciprofloxacin (CIPRO) 500 MG tablet Take 1 tablet (500 mg total) by mouth  2 (two) times daily. (Patient not taking: Reported on 02/22/2019) 6 tablet 0  . metroNIDAZOLE (FLAGYL) 500 MG tablet Take 1 tablet (500 mg total) by mouth 2 (two) times daily. (Patient not taking: Reported on 02/22/2019) 14 tablet 0   No facility-administered medications prior to visit.      ROS Review of Systems  Constitutional: Negative for activity change, appetite change and fatigue.  HENT: Negative for congestion, sinus pressure and sore throat.   Eyes: Negative for visual disturbance.  Respiratory: Negative for cough, chest tightness, shortness of breath and wheezing.   Cardiovascular: Negative for chest pain and palpitations.  Gastrointestinal: Negative for abdominal distention,  abdominal pain and constipation.  Endocrine: Negative for polydipsia.  Genitourinary: Negative for dysuria and frequency.  Musculoskeletal: Negative for arthralgias and back pain.  Skin: Negative for rash.  Neurological: Positive for weakness. Negative for tremors, light-headedness and numbness.  Hematological: Does not bruise/bleed easily.  Psychiatric/Behavioral: Negative for agitation and behavioral problems.    Objective:  BP 132/84   Pulse 89   Wt 189 lb (85.7 kg)   SpO2 99%   BMI 30.97 kg/m   BP/Weight 02/22/2019 01/13/5408 03/11/1913  Systolic BP 782 956 213  Diastolic BP 84 90 80  Wt. (Lbs) 189 190 190.2  BMI 30.97 31.14 31.17      Physical Exam Constitutional:      Appearance: She is well-developed.  Cardiovascular:     Rate and Rhythm: Normal rate.     Heart sounds: Normal heart sounds. No murmur.  Pulmonary:     Effort: Pulmonary effort is normal.     Breath sounds: Normal breath sounds. No wheezing or rales.  Chest:     Chest wall: No tenderness.  Abdominal:     General: Bowel sounds are normal. There is no distension.     Palpations: Abdomen is soft. There is no mass.     Tenderness: There is no abdominal tenderness.  Musculoskeletal: Normal range of motion.  Neurological:     Mental Status: She is alert and oriented to person, place, and time.     Comments: Left spastic hemiparesis     CMP Latest Ref Rng & Units 10/26/2018 10/13/2017 05/04/2017  Glucose 65 - 99 mg/dL 100(H) 91 104(H)  BUN 6 - 24 mg/dL '10 13 18  ' Creatinine 0.57 - 1.00 mg/dL 0.85 0.85 0.78  Sodium 134 - 144 mmol/L 143 141 139  Potassium 3.5 - 5.2 mmol/L 4.5 3.6 3.8  Chloride 96 - 106 mmol/L 101 99 96  CO2 20 - 29 mmol/L '26 25 26  ' Calcium 8.7 - 10.2 mg/dL 9.9 9.8 10.0  Total Protein 6.0 - 8.5 g/dL 7.8 7.6 7.8  Total Bilirubin 0.0 - 1.2 mg/dL 0.3 0.3 <0.2  Alkaline Phos 39 - 117 IU/L 94 68 65  AST 0 - 40 IU/L '27 17 16  ' ALT 0 - 32 IU/L 33(H) 17 18    Lipid Panel     Component Value  Date/Time   CHOL 108 10/26/2018 1112   TRIG 90 10/26/2018 1112   HDL 49 10/26/2018 1112   CHOLHDL 2.2 10/26/2018 1112   CHOLHDL 2.4 09/15/2016 1116   VLDL 22 09/15/2016 1116   LDLCALC 41 10/26/2018 1112    CBC    Component Value Date/Time   WBC 4.4 09/15/2016 1048   RBC 3.92 09/15/2016 1048   HGB 11.4 (L) 09/15/2016 1048   HCT 33.8 (L) 09/15/2016 1048   PLT 451 (H) 09/15/2016 1048   MCV 86.2  09/15/2016 1048   MCH 29.1 09/15/2016 1048   MCHC 33.7 09/15/2016 1048   RDW 14.8 09/15/2016 1048   LYMPHSABS 2.2 06/15/2015 0451   MONOABS 0.7 06/15/2015 0451   EOSABS 0.4 06/15/2015 0451   BASOSABS 0.0 06/15/2015 0451    Lab Results  Component Value Date   HGBA1C 6.4 02/22/2019    Assessment & Plan:   1. Controlled type 2 diabetes mellitus without complication, without long-term current use of insulin (HCC) Controlled with A1c of 6.4 Continue current management Completed form for diabetic shoes - POCT glycosylated hemoglobin (Hb A1C)  2. Pre-op evaluation She is scheduled for oral surgery Last stroke was in 2016 She can hold Plavix for 5 days prior to procedure and advised to resume 24 hours after. Advised to abstain from hot foods and drinks after her oral surgery to prevent increased risk of bleeding   Health Care Maintenance: We will order colonoscopy at next visit. No orders of the defined types were placed in this encounter.   Follow-up: Return for medical condition, keep previously scheduled appointment.       Charlott Rakes, MD, FAAFP. [facility] and [facility] [place], [place]   02/22/2019, 11:07 AM

## 2019-02-22 NOTE — Progress Notes (Signed)
Patient is here for dental clearance.

## 2019-02-24 ENCOUNTER — Telehealth: Payer: Self-pay | Admitting: Family Medicine

## 2019-02-24 NOTE — Telephone Encounter (Signed)
Rebecca Yoder with [facility] called wanting to receive notes for patients referral. States they are needing to get a foot exam.

## 2019-02-25 NOTE — Telephone Encounter (Signed)
Good Morning  We didn't refer patient it was [facility] . I called Mrs Rebecca Yoder from [facility]   [phone] I lvm to contact them . Thank you

## 2019-03-10 ENCOUNTER — Ambulatory Visit: Payer: Medicare HMO | Admitting: Physical Medicine & Rehabilitation

## 2019-03-14 MED FILL — AMLODIPINE BESYLATE 10 MG T: 10 | 30 days supply | Qty: 30 | Fill #5

## 2019-03-14 MED FILL — ATORVASTATIN CALCIUM 40 MG: 40 | 30 days supply | Qty: 30 | Fill #2

## 2019-03-14 MED FILL — CLOPIDOGREL 75 MG TABLET: 75 | 30 days supply | Qty: 30 | Fill #0

## 2019-03-21 MED FILL — CARVEDILOL 3.125 MG TABLET: 3.125 | 30 days supply | Qty: 60 | Fill #3

## 2019-03-24 MED FILL — LOSARTAN POTASSIUM 100 MG T: 100 | 30 days supply | Qty: 30 | Fill #0

## 2019-04-01 ENCOUNTER — Encounter: Payer: Self-pay | Admitting: Physical Medicine & Rehabilitation

## 2019-04-01 ENCOUNTER — Encounter: Payer: Medicare HMO | Attending: Physical Medicine & Rehabilitation | Admitting: Physical Medicine & Rehabilitation

## 2019-04-01 ENCOUNTER — Other Ambulatory Visit: Payer: Self-pay

## 2019-04-01 VITALS — BP 126/85 | HR 80 | Temp 98.9°F | Ht 65.0 in | Wt 191.0 lb

## 2019-04-01 DIAGNOSIS — E785 Hyperlipidemia, unspecified: Secondary | ICD-10-CM | POA: Diagnosis not present

## 2019-04-01 DIAGNOSIS — G8110 Spastic hemiplegia affecting unspecified side: Secondary | ICD-10-CM | POA: Diagnosis not present

## 2019-04-01 DIAGNOSIS — S43002A Unspecified subluxation of left shoulder joint, initial encounter: Secondary | ICD-10-CM | POA: Insufficient documentation

## 2019-04-01 DIAGNOSIS — M7502 Adhesive capsulitis of left shoulder: Secondary | ICD-10-CM | POA: Diagnosis not present

## 2019-04-01 DIAGNOSIS — G8194 Hemiplegia, unspecified affecting left nondominant side: Secondary | ICD-10-CM | POA: Diagnosis present

## 2019-04-01 DIAGNOSIS — R269 Unspecified abnormalities of gait and mobility: Secondary | ICD-10-CM | POA: Insufficient documentation

## 2019-04-01 DIAGNOSIS — Z8673 Personal history of transient ischemic attack (TIA), and cerebral infarction without residual deficits: Secondary | ICD-10-CM | POA: Diagnosis present

## 2019-04-01 DIAGNOSIS — M25512 Pain in left shoulder: Secondary | ICD-10-CM | POA: Insufficient documentation

## 2019-04-01 DIAGNOSIS — I10 Essential (primary) hypertension: Secondary | ICD-10-CM | POA: Insufficient documentation

## 2019-04-01 DIAGNOSIS — Z79899 Other long term (current) drug therapy: Secondary | ICD-10-CM | POA: Diagnosis not present

## 2019-04-01 DIAGNOSIS — M7542 Impingement syndrome of left shoulder: Secondary | ICD-10-CM | POA: Insufficient documentation

## 2019-04-01 DIAGNOSIS — I69354 Hemiplegia and hemiparesis following cerebral infarction affecting left non-dominant side: Secondary | ICD-10-CM | POA: Diagnosis not present

## 2019-04-01 NOTE — Patient Instructions (Signed)

## 2019-04-01 NOTE — Progress Notes (Signed)
Botox Injection for spasticity using needle EMG guidance  Dilution: 50 Units/ml Indication: Severe spasticity which interferes with ADL,mobility and/or  hygiene and is unresponsive to medication management and other conservative care Informed consent was obtained after describing risks and benefits of the procedure with the patient. This includes bleeding, bruising, infection, excessive weakness, or medication side effects. A REMS form is on file and signed. Needle: 50mm 25g  needle electrode Number of units per muscle    Posterior tibialis 75 Gastroc medial 75  FHL 50 FDL 75 EHL 25 All injections were done after obtaining appropriate EMG activity and after negative drawback for blood. The patient tolerated the procedure well. Post procedure instructions were given. A followup appointment was made.  

## 2019-04-12 MED FILL — CLOPIDOGREL 75 MG TABLET: 75 | 30 days supply | Qty: 30 | Fill #1

## 2019-04-12 MED FILL — ALBUTEROL SULFATE HFA 108 (: 108 (90 BAS | 25 days supply | Qty: 18 | Fill #1

## 2019-04-14 MED FILL — ATORVASTATIN CALCIUM 40 MG: 40 | 30 days supply | Qty: 30 | Fill #3

## 2019-04-19 ENCOUNTER — Ambulatory Visit (INDEPENDENT_AMBULATORY_CARE_PROVIDER_SITE_OTHER): Payer: Medicare HMO | Admitting: Internal Medicine

## 2019-04-19 ENCOUNTER — Encounter: Payer: Self-pay | Admitting: Internal Medicine

## 2019-04-19 ENCOUNTER — Other Ambulatory Visit: Payer: Self-pay

## 2019-04-19 VITALS — BP 110/80 | HR 88 | Temp 97.3°F | Ht 65.0 in | Wt 191.0 lb

## 2019-04-19 DIAGNOSIS — J4530 Mild persistent asthma, uncomplicated: Secondary | ICD-10-CM

## 2019-04-19 DIAGNOSIS — I63511 Cerebral infarction due to unspecified occlusion or stenosis of right middle cerebral artery: Secondary | ICD-10-CM | POA: Diagnosis not present

## 2019-04-19 DIAGNOSIS — G8110 Spastic hemiplegia affecting unspecified side: Secondary | ICD-10-CM | POA: Diagnosis not present

## 2019-04-19 DIAGNOSIS — K219 Gastro-esophageal reflux disease without esophagitis: Secondary | ICD-10-CM

## 2019-04-19 DIAGNOSIS — Z23 Encounter for immunization: Secondary | ICD-10-CM

## 2019-04-19 DIAGNOSIS — E7800 Pure hypercholesterolemia, unspecified: Secondary | ICD-10-CM | POA: Diagnosis not present

## 2019-04-19 DIAGNOSIS — I10 Essential (primary) hypertension: Secondary | ICD-10-CM

## 2019-04-19 DIAGNOSIS — E119 Type 2 diabetes mellitus without complications: Secondary | ICD-10-CM

## 2019-04-19 DIAGNOSIS — G8194 Hemiplegia, unspecified affecting left nondominant side: Secondary | ICD-10-CM | POA: Diagnosis not present

## 2019-04-19 LAB — POCT GLYCOSYLATED HEMOGLOBIN (HGB A1C): Hemoglobin A1C: 6.3 % — AB (ref 4.0–5.6)

## 2019-04-19 MED ORDER — PANTOPRAZOLE SODIUM 40 MG PO TBEC: DELAYED_RELEASE_TABLET | ORAL | 1 refills | Status: DC

## 2019-04-19 MED ORDER — LOSARTAN POTASSIUM 100 MG PO TABS: 100.0000 mg | ORAL_TABLET | Freq: Every day | ORAL | 1 refills | Status: DC

## 2019-04-19 MED ORDER — ATORVASTATIN CALCIUM 40 MG PO TABS: 40.0000 mg | ORAL_TABLET | Freq: Every day | ORAL | 1 refills | Status: DC

## 2019-04-19 MED ORDER — BACLOFEN 10 MG PO TABS: 10.0000 mg | ORAL_TABLET | Freq: Two times a day (BID) | ORAL | 2 refills | Status: DC | PRN

## 2019-04-19 MED ORDER — CARVEDILOL 3.125 MG PO TABS: 3.1250 mg | ORAL_TABLET | Freq: Two times a day (BID) | ORAL | 1 refills | Status: DC

## 2019-04-19 MED FILL — PANTOPRAZOLE SOD DR 40 MG T: 40 | 30 days supply | Qty: 30 | Fill #3

## 2019-04-19 NOTE — Progress Notes (Signed)
New Patient Office Visit     CC/Reason for Visit: Establish care, discuss chronic medical conditions, medication refills [facility] Last Visit: March 2020  HPI: Rebecca Yoder is a 53 y.o. female who is coming in today for the above mentioned reasons. Past Medical History is significant for: A right MCA CVA in 2016 with significant residual left spastic hemiplegia managed by Dr. Letta Pate with Botox injections.  She also has well-controlled hypertension, hyperlipidemia and diabetes.  She has no acute complaints today.  She does need medication refills.  She is requesting flu vaccination today.  She is a never smoker, does not drink alcohol, she is disabled.  Her family history significant for mother who died with multiple myeloma and a father who died with MI when she was age 24.   Past Medical/Surgical History: Past Medical History:  Diagnosis Date  . ASTHMA 12/10/2009  . CEREBROVASCULAR ACCIDENT, HX OF 12/10/2009  . Complication of anesthesia    "just can't get me up" (06/09/2013)  . Embolic stroke of right basal ganglia ([place]) 06/11/2015   with L residual weakness/hemiparesis   . HYPERLIPIDEMIA 12/10/2009  . HYPERTENSION 12/10/2009  . Stroke [facility]) 2008   denies residual on 06/09/2013  . Tendonitis of wrist, right     Past Surgical History:  Procedure Laterality Date  . COSMETIC SURGERY    . OVARIAN CYST REMOVAL  1990's    Social History:  reports that she has never smoked. She has never used smokeless tobacco. She reports that she does not drink alcohol or use drugs.  Allergies: Allergies  Allergen Reactions  . Penicillins Rash    Family History:  Family History  Problem Relation Age of Onset  . Multiple myeloma Mother      Current Outpatient Medications:  .  acetaminophen (TYLENOL) 325 MG tablet, Take 2 tablets (650 mg total) by mouth every 6 (six) hours as needed., Disp: 20 tablet, Rfl: 0 .  albuterol (PROAIR  HFA) 108 (90 Base) MCG/ACT inhaler, INHALE 2 PUFFS INTO THE LUNGS EVERY 6 HOURS AS NEEDED FOR WHEEZING., Disp: 8.5 g, Rfl: 1 .  amLODipine (NORVASC) 10 MG tablet, Take 1 tablet (10 mg total) by mouth daily., Disp: 30 tablet, Rfl: 6 .  atorvastatin (LIPITOR) 40 MG tablet, Take 1 tablet (40 mg total) by mouth daily., Disp: 90 tablet, Rfl: 1 .  baclofen (LIORESAL) 10 MG tablet, Take 1 tablet (10 mg total) by mouth 2 (two) times daily as needed for muscle spasms., Disp: 60 tablet, Rfl: 2 .  carvedilol (COREG) 3.125 MG tablet, Take 1 tablet (3.125 mg total) by mouth 2 (two) times daily with a meal., Disp: 180 tablet, Rfl: 1 .  clopidogrel (PLAVIX) 75 MG tablet, Take 1 tablet (75 mg total) by mouth daily., Disp: 30 tablet, Rfl: 6 .  diphenhydrAMINE (BENADRYL) 25 MG tablet, Take 50 mg by mouth at bedtime., Disp: , Rfl:  .  losartan (COZAAR) 100 MG tablet, Take 1 tablet (100 mg total) by mouth daily., Disp: 90 tablet, Rfl: 1 .  Multiple Vitamin (MULTIVITAMIN WITH MINERALS) TABS tablet, Take 1 tablet by mouth daily., Disp: , Rfl:  .  pantoprazole (PROTONIX) 40 MG tablet, TAKE 1 TABLET BY MOUTH prn, Disp: 90 tablet, Rfl: 1 .  polyethylene glycol (MIRALAX / GLYCOLAX) packet, Take 17 g by mouth daily., Disp: 30 each, Rfl: 1 .  triamcinolone ointment (KENALOG) 0.5 %, Apply 1 application topically 2 (two) times daily., Disp: 30  g, Rfl: 0  Review of Systems:  Constitutional: Denies fever, chills, diaphoresis, appetite change and fatigue.  HEENT: Denies photophobia, eye pain, redness, hearing loss, ear pain, congestion, sore throat, rhinorrhea, sneezing, mouth sores, trouble swallowing, neck pain, neck stiffness and tinnitus.   Respiratory: Denies SOB, DOE, cough, chest tightness,  and wheezing.   Cardiovascular: Denies chest pain, palpitations and leg swelling.  Gastrointestinal: Denies nausea, vomiting, abdominal pain, diarrhea, constipation, blood in stool and abdominal distention.  Genitourinary: Denies  dysuria, urgency, frequency, hematuria, flank pain and difficulty urinating.  Endocrine: Denies: hot or cold intolerance, sweats, changes in hair or nails, polyuria, polydipsia. Musculoskeletal: Denies myalgias, back pain, joint swelling, arthralgias and gait problem.  Skin: Denies pallor, rash and wound.  Neurological: Denies dizziness, seizures, syncope, weakness, light-headedness, numbness and headaches.  Hematological: Denies adenopathy. Easy bruising, personal or family bleeding history  Psychiatric/Behavioral: Denies suicidal ideation, mood changes, confusion, nervousness, sleep disturbance and agitation    Physical Exam: Vitals:   04/19/19 1342  BP: 110/80  Pulse: 88  Temp: (!) 97.3 F (36.3 C)  TempSrc: Temporal  SpO2: 98%  Weight: 191 lb (86.6 kg)  Height: _0  (1.651 m)   Body mass index is 31.78 kg/m.  Constitutional: NAD, calm, comfortable Eyes: PERRL, lids and conjunctivae normal, wears corrective lenses ENMT: Mucous membranes are moist.  Respiratory: clear to auscultation bilaterally, no wheezing, no crackles. Normal respiratory effort. No accessory muscle use.  Cardiovascular: Regular rate and rhythm, no murmurs / rubs / gallops. No extremity edema. 2+ pedal pulses. Abdomen: no tenderness, no masses palpated. No hepatosplenomegaly. Bowel sounds positive.  Musculoskeletal: no clubbing / cyanosis.   Skin: no rashes, lesions, ulcers. No induration Neurologic: Walks with a cane, left-sided hemiparesis Psychiatric: Normal judgment and insight. Alert and oriented x 3. Normal mood.    Impression and Plan:  Right middle cerebral artery stroke ([place]) -With residual left hemiparesis.  Essential hypertension -Well-controlled, refill medications today.  Mild persistent asthma without complication -Only occasional flareups, uses albuterol as needed.  Gastroesophageal reflux disease, esophagitis presence not specified -well-controlled on PPI therapy.  Controlled  type 2 diabetes mellitus without complication, without long-term current use of insulin ([place])  -She is diet controlled. -A1c today is 6.3.  Pure hypercholesterolemia -Last LDL was 41 in March 2020, she is on atorvastatin.     Patient Instructions  -Nice seeing you today!!  -Flu vaccine today.  -Will see you back in 3 months or sooner if needed.       Lelon Frohlich, MD [facility] at [facility]

## 2019-04-19 NOTE — Patient Instructions (Addendum)
-  Nice seeing you today!!  -Flu vaccine today.  -Will see you back in 3 months or sooner if needed.

## 2019-04-19 NOTE — Addendum Note (Signed)
Addended by: Westley Hummer B on: 04/19/2019 02:21 PM   Modules accepted: Orders

## 2019-04-25 ENCOUNTER — Other Ambulatory Visit: Payer: Self-pay | Admitting: Family Medicine

## 2019-05-13 ENCOUNTER — Encounter: Payer: Self-pay | Admitting: Physical Medicine & Rehabilitation

## 2019-05-13 ENCOUNTER — Encounter: Payer: Medicare HMO | Attending: Physical Medicine & Rehabilitation | Admitting: Physical Medicine & Rehabilitation

## 2019-05-13 ENCOUNTER — Other Ambulatory Visit: Payer: Self-pay

## 2019-05-13 VITALS — BP 127/83 | HR 89 | Temp 97.5°F | Ht 65.5 in | Wt 192.0 lb

## 2019-05-13 DIAGNOSIS — G8110 Spastic hemiplegia affecting unspecified side: Secondary | ICD-10-CM

## 2019-05-13 DIAGNOSIS — M7542 Impingement syndrome of left shoulder: Secondary | ICD-10-CM | POA: Diagnosis not present

## 2019-05-13 DIAGNOSIS — Z79899 Other long term (current) drug therapy: Secondary | ICD-10-CM | POA: Diagnosis not present

## 2019-05-13 DIAGNOSIS — I69354 Hemiplegia and hemiparesis following cerebral infarction affecting left non-dominant side: Secondary | ICD-10-CM | POA: Insufficient documentation

## 2019-05-13 DIAGNOSIS — M25512 Pain in left shoulder: Secondary | ICD-10-CM | POA: Diagnosis not present

## 2019-05-13 DIAGNOSIS — E785 Hyperlipidemia, unspecified: Secondary | ICD-10-CM | POA: Diagnosis not present

## 2019-05-13 DIAGNOSIS — S43002A Unspecified subluxation of left shoulder joint, initial encounter: Secondary | ICD-10-CM | POA: Insufficient documentation

## 2019-05-13 DIAGNOSIS — G8194 Hemiplegia, unspecified affecting left nondominant side: Secondary | ICD-10-CM | POA: Diagnosis present

## 2019-05-13 DIAGNOSIS — I10 Essential (primary) hypertension: Secondary | ICD-10-CM | POA: Diagnosis not present

## 2019-05-13 DIAGNOSIS — M7502 Adhesive capsulitis of left shoulder: Secondary | ICD-10-CM | POA: Insufficient documentation

## 2019-05-13 DIAGNOSIS — R269 Unspecified abnormalities of gait and mobility: Secondary | ICD-10-CM | POA: Insufficient documentation

## 2019-05-13 DIAGNOSIS — Z8673 Personal history of transient ischemic attack (TIA), and cerebral infarction without residual deficits: Secondary | ICD-10-CM | POA: Diagnosis present

## 2019-05-13 NOTE — Patient Instructions (Signed)
Try DEBROX drops 4 drops Right ear, cover with cotton ball and lay on left side for 20-78min twice a day

## 2019-05-13 NOTE — Progress Notes (Signed)
Subjective:    Patient ID: Rebecca Yoder, female    DOB: [birth year], 53 y.o.   MRN: [id number]  HPI   Patient returns after botulinum toxin injection performed 6 weeks ago to the following muscle groups.  Posterior tibialis 75 Gastroc medial 75   FHL 50 FDL 75 EHL 25  Patient indicates that her step length has improved.  We also talked about her walking posture.  She has complaints about reduced hearing in the right ear.  She does have a history of earwax removal in the past.  She denies any new balance issues or dizziness She is pleased with her new PCP  Pain Inventory Average Pain 0 Pain Right Now 0 My pain is na  In the last 24 hours, has pain interfered with the following? General activity 0 Relation with others 0 Enjoyment of life 0 What TIME of day is your pain at its worst? na Sleep (in general) Good  Pain is worse with: na Pain improves with: na Relief from Meds: 10  Mobility walk with assistance use a cane ability to climb steps?  yes do you drive?  no use a wheelchair  Function disabled: date disabled 2016  Neuro/Psych trouble walking  Prior Studies Any changes since last visit?  no  Physicians involved in your care Any changes since last visit?  yes Primary care Isaac Bliss MD   Family History  Problem Relation Age of Onset  . Multiple myeloma Mother    Social History   Socioeconomic History  . Marital status: Single    Spouse name: Not on file  . Number of children: 0  . Years of education: 12th  . Highest education level: Not on file  Occupational History  . Occupation: [employer]    Fish farm manager: [employer]  Social Needs  . Financial resource strain: Not on file  . Food insecurity    Worry: Not on file    Inability: Not on file  . Transportation needs    Medical: Not on file    Non-medical: Not on file  Tobacco Use  . Smoking status: Never Smoker  . Smokeless tobacco: Never Used  Substance and Sexual Activity  .  Alcohol use: No    Alcohol/week: 0.0 standard drinks  . Drug use: No  . Sexual activity: Yes  Lifestyle  . Physical activity    Days per week: Not on file    Minutes per session: Not on file  . Stress: Not on file  Relationships  . Social Herbalist on phone: Not on file    Gets together: Not on file    Attends religious service: Not on file    Active member of club or organization: Not on file    Attends meetings of clubs or organizations: Not on file    Relationship status: Not on file  Other Topics Concern  . Not on file  Social History Narrative  . Not on file   Past Surgical History:  Procedure Laterality Date  . COSMETIC SURGERY    . OVARIAN CYST REMOVAL  1990's   Past Medical History:  Diagnosis Date  . ASTHMA 12/10/2009  . CEREBROVASCULAR ACCIDENT, HX OF 12/10/2009  . Complication of anesthesia    "just can't get me up" (06/09/2013)  . Embolic stroke of right basal ganglia ([place]) 06/11/2015   with L residual weakness/hemiparesis   . HYPERLIPIDEMIA 12/10/2009  . HYPERTENSION 12/10/2009  . Stroke [facility]) 2008   denies residual  on 06/09/2013  . Tendonitis of wrist, right    BP 127/83   Pulse 89   Temp (!) 97.5 F (36.4 C)   Ht 5' 5.5" (1.664 m)   Wt 192 lb (87.1 kg)   SpO2 97%   BMI 31.46 kg/m   Opioid Risk Score:   Fall Risk Score:  `1  Depression screen PHQ 2/9  Depression screen [facility] 2/9 04/19/2019 02/22/2019 10/27/2018 10/20/2018 04/13/2018 01/26/2018 12/23/2017  Decreased Interest 0 '3 3 3 3 3 1  ' Down, Depressed, Hopeless 0 0 0 0 0 1 0  PHQ - 2 Score 0 '3 3 3 3 4 1  ' Altered sleeping 0 '3 3 3 3 ' - 2  Tired, decreased energy 0 0 '2 2 2 ' - 3  Change in appetite 0 0 0 0 0 - 3  Feeling bad or failure about yourself  0 0 0 1 0 - 0  Trouble concentrating 0 0 2 2 0 - 2  Moving slowly or fidgety/restless 0 0 0 0 0 - 2  Suicidal thoughts 0 0 0 0 0 - 0  PHQ-9 Score 0 '6 10 11 8 ' - 13  Difficult doing work/chores Not difficult at all - - - - - -  Some recent data might  be hidden     Review of Systems  Constitutional: Negative.   HENT: Negative.   Eyes: Negative.   Respiratory: Negative.   Cardiovascular: Negative.   Gastrointestinal: Negative.   Endocrine: Negative.   Genitourinary: Negative.   Musculoskeletal: Positive for gait problem.  Skin: Negative.   Allergic/Immunologic: Negative.   Hematological: Negative.   Psychiatric/Behavioral: Negative.   All other systems reviewed and are negative.      Objective:   Physical Exam  Gait is without evidence of knee instability she wears her left AFO and uses a small-based quad cane. She has some hip hiking.  She does have a bent forward posture and she needs cueing to keep her upper body erect. Motor strength is 4- at the left deltoid bicep tricep finger flexors and extensors She has decreased motor control decreased fine motor in the left upper extremity has difficulty tying her shoes although she can do this if she takes her time. Left lower extremity tone is 2 at the ankle plantar flexors 1 at the toe flexors 1 at the foot inverters.  There is no evidence of hyperactive Babinski. Motor strength in the right side is 5/5 Left lower extremity strength is 4/5 in the hip flexor knee extensor trace toe flexors extensors ankle dorsiflexors and ankle plantar flexors      Assessment & Plan:  1.  Left spastic hemiparesis secondary to right CVA approximately 4 years ago.  Overall has made a  good functional recovery.  Still has some motor control issues as well as reduced strength in the left foot and ankle region.  The Botox has been helping with her left lower extremity tone. I would repeat the same dosing and muscle group selection at next visit although we may reevaluate the EHL.  If she has no hyperactive Babinski we may be able to forego this muscle

## 2019-06-18 ENCOUNTER — Other Ambulatory Visit: Payer: Self-pay | Admitting: Family Medicine

## 2019-06-18 DIAGNOSIS — I10 Essential (primary) hypertension: Secondary | ICD-10-CM

## 2019-06-28 ENCOUNTER — Encounter: Payer: Medicare HMO | Attending: Physical Medicine & Rehabilitation | Admitting: Physical Medicine & Rehabilitation

## 2019-06-28 ENCOUNTER — Other Ambulatory Visit: Payer: Self-pay

## 2019-06-28 ENCOUNTER — Encounter: Payer: Self-pay | Admitting: Physical Medicine & Rehabilitation

## 2019-06-28 VITALS — BP 130/86 | HR 92 | Temp 97.7°F | Ht 65.5 in | Wt 192.0 lb

## 2019-06-28 DIAGNOSIS — I10 Essential (primary) hypertension: Secondary | ICD-10-CM | POA: Diagnosis not present

## 2019-06-28 DIAGNOSIS — Z79899 Other long term (current) drug therapy: Secondary | ICD-10-CM | POA: Diagnosis not present

## 2019-06-28 DIAGNOSIS — E785 Hyperlipidemia, unspecified: Secondary | ICD-10-CM | POA: Insufficient documentation

## 2019-06-28 DIAGNOSIS — M7542 Impingement syndrome of left shoulder: Secondary | ICD-10-CM | POA: Insufficient documentation

## 2019-06-28 DIAGNOSIS — S43002A Unspecified subluxation of left shoulder joint, initial encounter: Secondary | ICD-10-CM | POA: Insufficient documentation

## 2019-06-28 DIAGNOSIS — R269 Unspecified abnormalities of gait and mobility: Secondary | ICD-10-CM | POA: Diagnosis not present

## 2019-06-28 DIAGNOSIS — G8110 Spastic hemiplegia affecting unspecified side: Secondary | ICD-10-CM

## 2019-06-28 DIAGNOSIS — M7502 Adhesive capsulitis of left shoulder: Secondary | ICD-10-CM | POA: Diagnosis not present

## 2019-06-28 DIAGNOSIS — M25512 Pain in left shoulder: Secondary | ICD-10-CM | POA: Insufficient documentation

## 2019-06-28 DIAGNOSIS — G8194 Hemiplegia, unspecified affecting left nondominant side: Secondary | ICD-10-CM | POA: Diagnosis present

## 2019-06-28 DIAGNOSIS — Z8673 Personal history of transient ischemic attack (TIA), and cerebral infarction without residual deficits: Secondary | ICD-10-CM | POA: Diagnosis present

## 2019-06-28 DIAGNOSIS — I69354 Hemiplegia and hemiparesis following cerebral infarction affecting left non-dominant side: Secondary | ICD-10-CM | POA: Insufficient documentation

## 2019-06-28 NOTE — Progress Notes (Signed)
Botox Injection for spasticity using needle EMG guidance  Dilution: 50 Units/ml Indication: Severe spasticity which interferes with ADL,mobility and/or  hygiene and is unresponsive to medication management and other conservative care Informed consent was obtained after describing risks and benefits of the procedure with the patient. This includes bleeding, bruising, infection, excessive weakness, or medication side effects. A REMS form is on file and signed. Needle: 50mm 25g  needle electrode Number of units per muscle    Posterior tibialis 75 Gastroc medial 75  FHL 50 FDL 75 EHL 25 All injections were done after obtaining appropriate EMG activity and after negative drawback for blood. The patient tolerated the procedure well. Post procedure instructions were given. A followup appointment was made.  

## 2019-06-28 NOTE — Patient Instructions (Signed)

## 2019-07-20 ENCOUNTER — Other Ambulatory Visit: Payer: Self-pay

## 2019-07-21 ENCOUNTER — Encounter: Payer: Self-pay | Admitting: Internal Medicine

## 2019-07-21 ENCOUNTER — Ambulatory Visit (INDEPENDENT_AMBULATORY_CARE_PROVIDER_SITE_OTHER): Payer: Medicare HMO | Admitting: Internal Medicine

## 2019-07-21 VITALS — BP 126/70 | HR 91 | Temp 97.7°F | Wt 194.8 lb

## 2019-07-21 DIAGNOSIS — J4530 Mild persistent asthma, uncomplicated: Secondary | ICD-10-CM | POA: Diagnosis not present

## 2019-07-21 DIAGNOSIS — E7800 Pure hypercholesterolemia, unspecified: Secondary | ICD-10-CM | POA: Diagnosis not present

## 2019-07-21 DIAGNOSIS — G8110 Spastic hemiplegia affecting unspecified side: Secondary | ICD-10-CM | POA: Diagnosis not present

## 2019-07-21 DIAGNOSIS — E119 Type 2 diabetes mellitus without complications: Secondary | ICD-10-CM

## 2019-07-21 DIAGNOSIS — K219 Gastro-esophageal reflux disease without esophagitis: Secondary | ICD-10-CM | POA: Diagnosis not present

## 2019-07-21 DIAGNOSIS — I63511 Cerebral infarction due to unspecified occlusion or stenosis of right middle cerebral artery: Secondary | ICD-10-CM | POA: Diagnosis not present

## 2019-07-21 DIAGNOSIS — I10 Essential (primary) hypertension: Secondary | ICD-10-CM | POA: Diagnosis not present

## 2019-07-21 LAB — POCT GLYCOSYLATED HEMOGLOBIN (HGB A1C): Hemoglobin A1C: 6.3 % — AB (ref 4.0–5.6)

## 2019-07-21 MED ORDER — PANTOPRAZOLE SODIUM 40 MG PO TBEC: DELAYED_RELEASE_TABLET | ORAL | 1 refills | Status: DC

## 2019-07-21 MED ORDER — AMLODIPINE BESYLATE 10 MG PO TABS: 10.0000 mg | ORAL_TABLET | Freq: Every day | ORAL | 1 refills | Status: DC

## 2019-07-21 NOTE — Progress Notes (Signed)
Established Patient Office Visit     This visit occurred during the SARS-CoV-2 public health emergency.  Safety protocols were in place, including screening questions prior to the visit, additional usage of staff PPE, and extensive cleaning of exam room while observing appropriate contact time as indicated for disinfecting solutions.    CC/Reason for Visit: 35-monthfollow-up chronic medical conditions  HPI: Rebecca Yoder a 53y.o. female who is coming in today for the above mentioned reasons. Past Medical History is significant for: right MCA CVA in 2016 with significant residual left spastic hemiplegia managed by Dr. KLetta Patewith Botox injections.  She also has well-controlled hypertension, hyperlipidemia and diabetes.   She is requesting refills of her amlodipine and Protonix today.  She wants me to look at a "sore spot" on the inside of her left calf from where her AFO has been rubbing.   Past Medical/Surgical History: Past Medical History:  Diagnosis Date  . ASTHMA 12/10/2009  . CEREBROVASCULAR ACCIDENT, HX OF 12/10/2009  . Complication of anesthesia    "just can't get me up" (06/09/2013)  . [place]   with L residual weakness/hemiparesis   . HYPERLIPIDEMIA 12/10/2009  . HYPERTENSION 12/10/2009  . Stroke ([facility] 2008   denies residual on 06/09/2013  . Tendonitis of wrist, right     Past Surgical History:  Procedure Laterality Date  . COSMETIC SURGERY    . OVARIAN CYST REMOVAL  1990's    Social History:  reports that she has never smoked. She has never used smokeless tobacco. She reports that she does not drink alcohol or use drugs.  Allergies: Allergies  Allergen Reactions  . Penicillins Rash    Family History:  Family History  Problem Relation Age of Onset  . Multiple myeloma Mother      Current Outpatient Medications:  .  acetaminophen (TYLENOL) 325 MG tablet, Take 2 tablets (650 mg total) by mouth every  6 (six) hours as needed., Disp: 20 tablet, Rfl: 0 .  albuterol (PROAIR HFA) 108 (90 Base) MCG/ACT inhaler, INHALE 2 PUFFS INTO THE LUNGS EVERY 6 HOURS AS NEEDED FOR WHEEZING., Disp: 8.5 g, Rfl: 1 .  amLODipine (NORVASC) 10 MG tablet, Take 1 tablet (10 mg total) by mouth daily. Must have office visit for refills., Disp: 30 tablet, Rfl: 0 .  atorvastatin (LIPITOR) 40 MG tablet, Take 1 tablet (40 mg total) by mouth daily., Disp: 90 tablet, Rfl: 1 .  baclofen (LIORESAL) 10 MG tablet, Take 1 tablet (10 mg total) by mouth 2 (two) times daily as needed for muscle spasms., Disp: 60 tablet, Rfl: 2 .  carvedilol (COREG) 3.125 MG tablet, Take 1 tablet (3.125 mg total) by mouth 2 (two) times daily with a meal., Disp: 180 tablet, Rfl: 1 .  clopidogrel (PLAVIX) 75 MG tablet, Take 1 tablet (75 mg total) by mouth daily., Disp: 30 tablet, Rfl: 6 .  diphenhydrAMINE (BENADRYL) 25 MG tablet, Take 50 mg by mouth at bedtime., Disp: , Rfl:  .  losartan (COZAAR) 100 MG tablet, Take 1 tablet (100 mg total) by mouth daily., Disp: 90 tablet, Rfl: 1 .  Multiple Vitamin (MULTIVITAMIN WITH MINERALS) TABS tablet, Take 1 tablet by mouth daily., Disp: , Rfl:  .  pantoprazole (PROTONIX) 40 MG tablet, TAKE 1 TABLET BY MOUTH prn, Disp: 90 tablet, Rfl: 1 .  polyethylene glycol (MIRALAX / GLYCOLAX) packet, Take 17 g by mouth daily., Disp: 30 each, Rfl: 1 .  triamcinolone ointment (KENALOG) 0.5 %, Apply 1 application topically 2 (two) times daily., Disp: 30 g, Rfl: 0  Review of Systems:  Constitutional: Denies fever, chills, diaphoresis, appetite change and fatigue.  HEENT: Denies photophobia, eye pain, redness, hearing loss, ear pain, congestion, sore throat, rhinorrhea, sneezing, mouth sores, trouble swallowing, neck pain, neck stiffness and tinnitus.   Respiratory: Denies SOB, DOE, cough, chest tightness,  and wheezing.   Cardiovascular: Denies chest pain, palpitations and leg swelling.  Gastrointestinal: Denies nausea, vomiting,  abdominal pain, diarrhea, constipation, blood in stool and abdominal distention.  Genitourinary: Denies dysuria, urgency, frequency, hematuria, flank pain and difficulty urinating.  Endocrine: Denies: hot or cold intolerance, sweats, changes in hair or nails, polyuria, polydipsia. Musculoskeletal: Denies myalgias, back pain, joint swelling, arthralgias and gait problem.  Skin: Denies pallor, rash and wound.  Neurological: Denies dizziness, seizures, syncope, weakness, light-headedness, numbness and headaches.  Hematological: Denies adenopathy. Easy bruising, personal or family bleeding history  Psychiatric/Behavioral: Denies suicidal ideation, mood changes, confusion, nervousness, sleep disturbance and agitation    Physical Exam: Vitals:   07/21/19 1302  BP: 126/70  Pulse: 91  Temp: 97.7 F (36.5 C)  TempSrc: Temporal  SpO2: 98%  Weight: 194 lb 12.8 oz (88.4 kg)    Body mass index is 31.92 kg/m.   Constitutional: NAD, calm, comfortable Eyes: PERRL, lids and conjunctivae normal, wears corrective lenses ENMT: Mucous membranes are moist.  Respiratory: clear to auscultation bilaterally, no wheezing, no crackles. Normal respiratory effort. No accessory muscle use.  Cardiovascular: Regular rate and rhythm, no murmurs / rubs / gallops. No extremity edema. 2+ pedal pulses.   Abdomen: no tenderness, no masses palpated. No hepatosplenomegaly. Bowel sounds positive.  Musculoskeletal: no clubbing / cyanosis. No joint deformity upper and lower extremities. Good ROM, no contractures. Normal muscle tone.  Skin: Medium sized area of hyperpigmentation over the inside of her left calf at the site of rubbing from her AFO, no broken skin or signs of cellulitis. Psychiatric: Normal judgment and insight. Alert and oriented x 3. Normal mood.    Impression and Plan:  Controlled type 2 diabetes mellitus without complication, without long-term current use of insulin (HCC)  -A1c in office today is 6.3  demonstrating good control.  Gastroesophageal reflux disease, unspecified whether esophagitis present -Well-controlled on Protonix, requesting refills today.  Right middle cerebral artery stroke (HCC) Spastic hemiplegia affecting nondominant side (HCC) -On statin, Plavix, continue follow-up with Dr. Letta Pate for management of left spastic hemiplegia.  Essential hypertension -Well-controlled on current regimen  Pure hypercholesterolemia -Last LDL was 41 in March 2020, continue atorvastatin.  Mild persistent asthma without complication -No flareups this past year.    Patient Instructions  -Nice seeing you today!!  -You are doing a fantastic job at controlling your medical issues! :)  -Schedule follow up for a physical in 3 months.     Lelon Frohlich, MD [facility]

## 2019-07-21 NOTE — Patient Instructions (Signed)
-  Nice seeing you today!!  -You are doing a fantastic job at controlling your medical issues! :)  -Schedule follow up for a physical in 3 months.

## 2019-07-23 ENCOUNTER — Other Ambulatory Visit: Payer: Self-pay | Admitting: Family Medicine

## 2019-07-23 DIAGNOSIS — I10 Essential (primary) hypertension: Secondary | ICD-10-CM

## 2019-07-28 ENCOUNTER — Telehealth: Payer: Self-pay | Admitting: *Deleted

## 2019-07-28 NOTE — Telephone Encounter (Signed)
Copied from [place] [id number]. Topic: General - Inquiry >> Jul 28, 2019  3:12 PM Mathis Bud wrote: Reason for CRM: patient is requesting nurse to call back regarding her blood work.  Patient is having a procedure done at dentist office and needs to discus something's before it is done because she is on blood thinner Call back [phone]

## 2019-07-29 NOTE — Telephone Encounter (Signed)
Spoke with patient and she will find out what test are needed and call back.

## 2019-08-02 ENCOUNTER — Encounter: Payer: Medicare HMO | Attending: Physical Medicine & Rehabilitation | Admitting: Physical Medicine & Rehabilitation

## 2019-08-02 ENCOUNTER — Encounter: Payer: Self-pay | Admitting: Physical Medicine & Rehabilitation

## 2019-08-02 ENCOUNTER — Other Ambulatory Visit: Payer: Self-pay

## 2019-08-02 VITALS — BP 145/84 | HR 93 | Temp 97.9°F | Ht 65.5 in | Wt 192.4 lb

## 2019-08-02 DIAGNOSIS — S43002A Unspecified subluxation of left shoulder joint, initial encounter: Secondary | ICD-10-CM | POA: Insufficient documentation

## 2019-08-02 DIAGNOSIS — M25512 Pain in left shoulder: Secondary | ICD-10-CM | POA: Diagnosis not present

## 2019-08-02 DIAGNOSIS — M7542 Impingement syndrome of left shoulder: Secondary | ICD-10-CM | POA: Insufficient documentation

## 2019-08-02 DIAGNOSIS — I69354 Hemiplegia and hemiparesis following cerebral infarction affecting left non-dominant side: Secondary | ICD-10-CM | POA: Diagnosis not present

## 2019-08-02 DIAGNOSIS — Z79899 Other long term (current) drug therapy: Secondary | ICD-10-CM | POA: Insufficient documentation

## 2019-08-02 DIAGNOSIS — I10 Essential (primary) hypertension: Secondary | ICD-10-CM | POA: Insufficient documentation

## 2019-08-02 DIAGNOSIS — G8110 Spastic hemiplegia affecting unspecified side: Secondary | ICD-10-CM

## 2019-08-02 DIAGNOSIS — R269 Unspecified abnormalities of gait and mobility: Secondary | ICD-10-CM | POA: Insufficient documentation

## 2019-08-02 DIAGNOSIS — Z8673 Personal history of transient ischemic attack (TIA), and cerebral infarction without residual deficits: Secondary | ICD-10-CM | POA: Diagnosis present

## 2019-08-02 DIAGNOSIS — E785 Hyperlipidemia, unspecified: Secondary | ICD-10-CM | POA: Insufficient documentation

## 2019-08-02 DIAGNOSIS — M7502 Adhesive capsulitis of left shoulder: Secondary | ICD-10-CM | POA: Insufficient documentation

## 2019-08-02 DIAGNOSIS — G8194 Hemiplegia, unspecified affecting left nondominant side: Secondary | ICD-10-CM | POA: Diagnosis present

## 2019-08-02 NOTE — Patient Instructions (Signed)
Repeat botox in 6wks

## 2019-08-02 NOTE — Progress Notes (Signed)
Subjective:    Patient ID: Rebecca Yoder, female    DOB: April 30, 1966, 53 y.o.   MRN: [id number]  HPI   Squats 15 reps added to her workout , helping with medial Left thigh pain  Left spastic hemiparesis Botox formed on 1117/2020 with good results.  The following muscle groups were injected   Posterior tibialis 75 Gastroc medial 75  FHL 50 FDL75 EHL 25 Pain Inventory Average Pain 0 Pain Right Now 0 My pain is sharp  In the last 24 hours, has pain interfered with the following? General activity 7 Relation with others 10 Enjoyment of life 8 What TIME of day is your pain at its worst? daytime Sleep (in general) Fair  Pain is worse with: walking Pain improves with: therapy/exercise Relief from Meds: 10  Mobility use a cane ability to climb steps?  no do you drive?  no use a wheelchair  Function disabled: date disabled 06/11/2015  Neuro/Psych No problems in this area  Prior Studies Any changes since last visit?  no  Physicians involved in your care Any changes since last visit?  no   Family History  Problem Relation Age of Onset  . Multiple myeloma Mother    Social History   Socioeconomic History  . Marital status: Single    Spouse name: Not on file  . Number of children: 0  . Years of education: 12th  . Highest education level: Not on file  Occupational History  . Occupation: budd    Fish farm manager: [employer]  Tobacco Use  . Smoking status: Never Smoker  . Smokeless tobacco: Never Used  Substance and Sexual Activity  . Alcohol use: No    Alcohol/week: 0.0 standard drinks  . Drug use: No  . Sexual activity: Yes  Other Topics Concern  . Not on file  Social History Narrative  . Not on file   Social Determinants of Health   Financial Resource Strain:   . Difficulty of Paying Living Expenses: Not on file  Food Insecurity:   . Worried About Charity fundraiser in the Last Year: Not on file  . Ran Out of Food in the Last Year: Not on  file  Transportation Needs:   . Lack of Transportation (Medical): Not on file  . Lack of Transportation (Non-Medical): Not on file  Physical Activity:   . Days of Exercise per Week: Not on file  . Minutes of Exercise per Session: Not on file  Stress:   . Feeling of Stress : Not on file  Social Connections:   . Frequency of Communication with Friends and Family: Not on file  . Frequency of Social Gatherings with Friends and Family: Not on file  . Attends Religious Services: Not on file  . Active Member of Clubs or Organizations: Not on file  . Attends Archivist Meetings: Not on file  . Marital Status: Not on file   Past Surgical History:  Procedure Laterality Date  . COSMETIC SURGERY    . OVARIAN CYST REMOVAL  1990's   Past Medical History:  Diagnosis Date  . ASTHMA 12/10/2009  . CEREBROVASCULAR ACCIDENT, HX OF 12/10/2009  . Complication of anesthesia    "just can't get me up" (06/09/2013)  . Embolic stroke of right basal ganglia ([place]) 06/11/2015   with L residual weakness/hemiparesis   . HYPERLIPIDEMIA 12/10/2009  . HYPERTENSION 12/10/2009  . Stroke [facility]) 2008   denies residual on 06/09/2013  . Tendonitis of wrist, right  BP (!) 145/84   Pulse 93   Temp 97.9 F (36.6 C)   Ht 5' 5.5" (1.664 m)   Wt 192 lb 6.4 oz (87.3 kg)   SpO2 95%   BMI 31.53 kg/m   Opioid Risk Score:   Fall Risk Score:  `1  Depression screen PHQ 2/9  Depression screen [facility] 2/9 04/19/2019 02/22/2019 10/27/2018 10/20/2018 04/13/2018 01/26/2018 12/23/2017  Decreased Interest 0 '3 3 3 3 3 1  ' Down, Depressed, Hopeless 0 0 0 0 0 1 0  PHQ - 2 Score 0 '3 3 3 3 4 1  ' Altered sleeping 0 '3 3 3 3 ' - 2  Tired, decreased energy 0 0 '2 2 2 ' - 3  Change in appetite 0 0 0 0 0 - 3  Feeling bad or failure about yourself  0 0 0 1 0 - 0  Trouble concentrating 0 0 2 2 0 - 2  Moving slowly or fidgety/restless 0 0 0 0 0 - 2  Suicidal thoughts 0 0 0 0 0 - 0  PHQ-9 Score 0 '6 10 11 8 ' - 13  Difficult doing work/chores Not  difficult at all - - - - - -  Some recent data might be hidden     Review of Systems  Musculoskeletal: Positive for gait problem.  All other systems reviewed and are negative.      Objective:   Physical Exam Vitals and nursing note reviewed.  Constitutional:      Appearance: She is obese.  HENT:     Head: Normocephalic and atraumatic.  Eyes:     Extraocular Movements: Extraocular movements intact.     Conjunctiva/sclera: Conjunctivae normal.     Pupils: Pupils are equal, round, and reactive to light.  Skin:    General: Skin is warm and dry.  Neurological:     Mental Status: She is alert and oriented to person, place, and time.  Psychiatric:        Mood and Affect: Mood normal.        Behavior: Behavior normal.     Tone MAS 1 at the toe flexors MAS 1 at the foot inverters on the left MAS 2 at the ankle plantar flexors on the left Motor strength is 4/5 in the left hip flexor knee extensor 0 ankle dorsiflexor to minus ankle plantar flexor 0 at the toe flexors and extensors, 1 at the foot inverters Ambulates with a left AFO there is no evidence of toe drag or knee instability she does have some mild hip hiking on the left side to clear her foot with some mild circumduction.      Assessment & Plan:  #1.  Left spastic hemiplegia her tone primarily affects her lower extremity during gait.  She ambulates without the AFO when she is getting up to go to the bathroom at night.  The Botox is helping with her foot inversion and toe curling especially when the brace is off. We will continue with the current dose and repeat in 6 weeks.  Discussed with patient she agrees with plan.  We reviewed her exercise program

## 2019-09-13 ENCOUNTER — Encounter: Payer: Medicare HMO | Admitting: Physical Medicine & Rehabilitation

## 2019-09-14 DIAGNOSIS — K006 Disturbances in tooth eruption: Secondary | ICD-10-CM | POA: Diagnosis not present

## 2019-09-14 DIAGNOSIS — K136 Irritative hyperplasia of oral mucosa: Secondary | ICD-10-CM | POA: Diagnosis not present

## 2019-09-29 ENCOUNTER — Encounter: Payer: Medicare HMO | Admitting: Physical Medicine & Rehabilitation

## 2019-10-07 ENCOUNTER — Other Ambulatory Visit: Payer: Self-pay

## 2019-10-07 ENCOUNTER — Encounter: Payer: Medicare HMO | Attending: Physical Medicine & Rehabilitation | Admitting: Physical Medicine & Rehabilitation

## 2019-10-07 ENCOUNTER — Encounter: Payer: Self-pay | Admitting: Physical Medicine & Rehabilitation

## 2019-10-07 VITALS — BP 139/90 | HR 88 | Temp 97.9°F | Ht 65.5 in | Wt 190.0 lb

## 2019-10-07 DIAGNOSIS — R269 Unspecified abnormalities of gait and mobility: Secondary | ICD-10-CM | POA: Insufficient documentation

## 2019-10-07 DIAGNOSIS — M7502 Adhesive capsulitis of left shoulder: Secondary | ICD-10-CM | POA: Diagnosis not present

## 2019-10-07 DIAGNOSIS — S43002A Unspecified subluxation of left shoulder joint, initial encounter: Secondary | ICD-10-CM | POA: Insufficient documentation

## 2019-10-07 DIAGNOSIS — I69354 Hemiplegia and hemiparesis following cerebral infarction affecting left non-dominant side: Secondary | ICD-10-CM | POA: Diagnosis not present

## 2019-10-07 DIAGNOSIS — E785 Hyperlipidemia, unspecified: Secondary | ICD-10-CM | POA: Insufficient documentation

## 2019-10-07 DIAGNOSIS — M7542 Impingement syndrome of left shoulder: Secondary | ICD-10-CM | POA: Diagnosis not present

## 2019-10-07 DIAGNOSIS — M25512 Pain in left shoulder: Secondary | ICD-10-CM | POA: Insufficient documentation

## 2019-10-07 DIAGNOSIS — G8110 Spastic hemiplegia affecting unspecified side: Secondary | ICD-10-CM

## 2019-10-07 DIAGNOSIS — I10 Essential (primary) hypertension: Secondary | ICD-10-CM | POA: Diagnosis not present

## 2019-10-07 DIAGNOSIS — Z8673 Personal history of transient ischemic attack (TIA), and cerebral infarction without residual deficits: Secondary | ICD-10-CM | POA: Diagnosis present

## 2019-10-07 DIAGNOSIS — G8194 Hemiplegia, unspecified affecting left nondominant side: Secondary | ICD-10-CM | POA: Diagnosis present

## 2019-10-07 DIAGNOSIS — Z79899 Other long term (current) drug therapy: Secondary | ICD-10-CM | POA: Insufficient documentation

## 2019-10-07 NOTE — Patient Instructions (Signed)

## 2019-10-07 NOTE — Progress Notes (Signed)
Botox Injection for spasticity using needle EMG guidance  Dilution: 50 Units/ml Indication: Severe spasticity which interferes with ADL,mobility and/or  hygiene and is unresponsive to medication management and other conservative care Informed consent was obtained after describing risks and benefits of the procedure with the patient. This includes bleeding, bruising, infection, excessive weakness, or medication side effects. A REMS form is on file and signed. Needle: 50mm 25g  needle electrode Number of units per muscle    Posterior tibialis 75 Gastroc medial 75  FHL 50 FDL 75 EHL 25 All injections were done after obtaining appropriate EMG activity and after negative drawback for blood. The patient tolerated the procedure well. Post procedure instructions were given. A followup appointment was made.  

## 2019-10-13 DIAGNOSIS — R42 Dizziness and giddiness: Secondary | ICD-10-CM | POA: Diagnosis not present

## 2019-10-13 DIAGNOSIS — R531 Weakness: Secondary | ICD-10-CM | POA: Diagnosis not present

## 2019-10-17 ENCOUNTER — Telehealth: Payer: Self-pay | Admitting: Internal Medicine

## 2019-10-17 NOTE — Telephone Encounter (Signed)
The patient wanted to let Dr. Jerilee Hoh know that on March 4th that she thinks that she has a stomach virus and not able to go to the bathroom.  She called 911 on Thursday but didn't go to the hospital. They checked her vitals and her heart and everything is fine.  She just wanted to let you know that she feels just fine and that she can use the bathroom now.  Nothing further needed

## 2019-10-18 NOTE — Telephone Encounter (Signed)
fyi

## 2019-10-19 ENCOUNTER — Encounter: Payer: Medicare HMO | Admitting: Internal Medicine

## 2019-10-29 ENCOUNTER — Other Ambulatory Visit: Payer: Self-pay | Admitting: Internal Medicine

## 2019-10-29 DIAGNOSIS — I10 Essential (primary) hypertension: Secondary | ICD-10-CM

## 2019-11-15 ENCOUNTER — Encounter: Payer: Medicare HMO | Attending: Physical Medicine & Rehabilitation | Admitting: Physical Medicine & Rehabilitation

## 2019-11-15 ENCOUNTER — Encounter: Payer: Self-pay | Admitting: Physical Medicine & Rehabilitation

## 2019-11-15 ENCOUNTER — Other Ambulatory Visit: Payer: Self-pay

## 2019-11-15 VITALS — BP 128/81 | HR 83 | Temp 97.7°F | Ht 65.5 in | Wt 191.0 lb

## 2019-11-15 DIAGNOSIS — M7542 Impingement syndrome of left shoulder: Secondary | ICD-10-CM | POA: Diagnosis not present

## 2019-11-15 DIAGNOSIS — Z79899 Other long term (current) drug therapy: Secondary | ICD-10-CM | POA: Diagnosis not present

## 2019-11-15 DIAGNOSIS — M25512 Pain in left shoulder: Secondary | ICD-10-CM | POA: Insufficient documentation

## 2019-11-15 DIAGNOSIS — I10 Essential (primary) hypertension: Secondary | ICD-10-CM | POA: Diagnosis not present

## 2019-11-15 DIAGNOSIS — E785 Hyperlipidemia, unspecified: Secondary | ICD-10-CM | POA: Diagnosis not present

## 2019-11-15 DIAGNOSIS — S43002A Unspecified subluxation of left shoulder joint, initial encounter: Secondary | ICD-10-CM | POA: Diagnosis not present

## 2019-11-15 DIAGNOSIS — G8110 Spastic hemiplegia affecting unspecified side: Secondary | ICD-10-CM | POA: Diagnosis not present

## 2019-11-15 DIAGNOSIS — R269 Unspecified abnormalities of gait and mobility: Secondary | ICD-10-CM | POA: Insufficient documentation

## 2019-11-15 DIAGNOSIS — I69354 Hemiplegia and hemiparesis following cerebral infarction affecting left non-dominant side: Secondary | ICD-10-CM | POA: Insufficient documentation

## 2019-11-15 DIAGNOSIS — G8194 Hemiplegia, unspecified affecting left nondominant side: Secondary | ICD-10-CM | POA: Diagnosis present

## 2019-11-15 DIAGNOSIS — Z8673 Personal history of transient ischemic attack (TIA), and cerebral infarction without residual deficits: Secondary | ICD-10-CM | POA: Diagnosis present

## 2019-11-15 DIAGNOSIS — M7502 Adhesive capsulitis of left shoulder: Secondary | ICD-10-CM | POA: Insufficient documentation

## 2019-11-15 NOTE — Progress Notes (Signed)
Subjective:    Patient ID: Rebecca Yoder, female    DOB: [birth year], 54 y.o.   MRN: [id number]  HPI   [place] on Left knee last noc, no LOC.  Was rushing too much.  No knee pain this am .  Pt had her AFO on   Had episode of weakness after she was limiting her calories  Back to exercising 1hr 80mn 2-3 days per week    Pain Inventory Average Pain 0 Pain Right Now 0 My pain is sharp  In the last 24 hours, has pain interfered with the following? General activity 8 Relation with others 9 Enjoyment of life 9 What TIME of day is your pain at its worst? evening, night  Sleep (in general) Fair  Pain is worse with: walking Pain improves with: medication Relief from Meds: 8  Mobility walk with assistance use a cane ability to climb steps?  no do you drive?  no Do you have any goals in this area?  yes  Function Do you have any goals in this area?  yes  Neuro/Psych trouble walking  Prior Studies Any changes since last visit?  no  Physicians involved in your care Any changes since last visit?  no   Family History  Problem Relation Age of Onset  . Multiple myeloma Mother    Social History   Socioeconomic History  . Marital status: Single    Spouse name: Not on file  . Number of children: 0  . Years of education: 12th  . Highest education level: Not on file  Occupational History  . Occupation: budd    EFish farm manager [employer]  Tobacco Use  . Smoking status: Never Smoker  . Smokeless tobacco: Never Used  Substance and Sexual Activity  . Alcohol use: No    Alcohol/week: 0.0 standard drinks  . Drug use: No  . Sexual activity: Yes  Other Topics Concern  . Not on file  Social History Narrative  . Not on file   Social Determinants of Health   Financial Resource Strain:   . Difficulty of Paying Living Expenses:   Food Insecurity:   . Worried About RCharity fundraiserin the Last Year:   . RArboriculturistin the Last Year:   Transportation Needs:     . LFilm/video editor(Medical):   .Marland KitchenLack of Transportation (Non-Medical):   Physical Activity:   . Days of Exercise per Week:   . Minutes of Exercise per Session:   Stress:   . Feeling of Stress :   Social Connections:   . Frequency of Communication with Friends and Family:   . Frequency of Social Gatherings with Friends and Family:   . Attends Religious Services:   . Active Member of Clubs or Organizations:   . Attends CArchivistMeetings:   .Marland KitchenMarital Status:    Past Surgical History:  Procedure Laterality Date  . COSMETIC SURGERY    . OVARIAN CYST REMOVAL  1990's   Past Medical History:  Diagnosis Date  . ASTHMA 12/10/2009  . CEREBROVASCULAR ACCIDENT, HX OF 12/10/2009  . Complication of anesthesia    "just can't get me up" (06/09/2013)  . Embolic stroke of right basal ganglia [place] 06/11/2015   with L residual weakness/hemiparesis   . HYPERLIPIDEMIA 12/10/2009  . HYPERTENSION 12/10/2009  . Stroke ([facility] 2008   denies residual on 06/09/2013  . Tendonitis of wrist, right    Pulse 83   Temp 97.7  F (36.5 C)   Ht 5' 5.5" (1.664 m)   Wt 191 lb (86.6 kg)   SpO2 96%   BMI 31.30 kg/m   Opioid Risk Score:   Fall Risk Score:  `1  Depression screen PHQ 2/9  Depression screen [facility] 2/9 04/19/2019 02/22/2019 10/27/2018 10/20/2018 04/13/2018 01/26/2018 12/23/2017  Decreased Interest 0 _0 Down, Depressed, Hopeless 0 0 0 0 0 1 0  PHQ - 2 Score 0 _1 Altered sleeping 0 _2 - 2  Tired, decreased energy 0 0 _3 - 3  Change in appetite 0 0 0 0 0 - 3  Feeling bad or failure about yourself  0 0 0 1 0 - 0  Trouble concentrating 0 0 2 2 0 - 2  Moving slowly or fidgety/restless 0 0 0 0 0 - 2  Suicidal thoughts 0 0 0 0 0 - 0  PHQ-9 Score 0 _4 - 13  Difficult doing work/chores Not difficult at all - - - - - -  Some recent data might be hidden    Review of Systems  Constitutional: Negative.   HENT: Negative.   Eyes: Negative.   Respiratory:  Negative.   Cardiovascular: Negative.   Gastrointestinal: Negative.   Endocrine: Negative.   Genitourinary: Negative.   Musculoskeletal: Positive for gait problem.  Allergic/Immunologic: Negative.   Hematological: Negative.   Psychiatric/Behavioral: Negative.   All other systems reviewed and are negative.      Objective:   Physical Exam Vitals reviewed.  Constitutional:      Appearance: She is obese.    Motor strength 5/5 on the right in the deltoid, bicep, tricep, grip, hip flexor, knee extensors, ankle dorsiflexor Left upper extremity is 3 - at the deltoid 3 - at the bicep to minus at the tricep to minus at the finger flexors and extensors 3 - at the left hip flexor 4 at the left knee extensor and trace ankle dorsiflexor to minus ankle plantar flexor Sensation intact to light touch in left upper and left lower limb Ambulates with four-point cane she has poor heel strike no evidence of knee instability.  Musculoskeletal: Left knee no evidence of effusion no evidence of erythema no medial lateral instability.  No tenderness over the patella no tenderness over the patellar tendon or the hamstrings muscle groups.       Assessment & Plan:   1.  Left spastic hemiparesis with LLE> LUE spasticity , improved after Botox injection 6 wks ago, we discussed exercise, safety with ambulation given recent fall.   Repeat Botox in 6 wks    Posterior tibialis 75 Gastroc medial 75  FHL 50 FDL 75 EHL 25  2.  Fall, without injury, no need for further work up, pt has higher gait velocity that she did in the past and this may be increasing her fall risk, no need for PT at this time, discussed with pt

## 2019-11-15 NOTE — Patient Instructions (Signed)
Will repeat same dose Botox next visit in 6 wks  Slow down when you are walking in the house

## 2019-11-17 ENCOUNTER — Other Ambulatory Visit: Payer: Self-pay

## 2019-11-18 ENCOUNTER — Ambulatory Visit: Payer: Medicare HMO | Admitting: Physical Medicine & Rehabilitation

## 2019-11-18 ENCOUNTER — Ambulatory Visit (INDEPENDENT_AMBULATORY_CARE_PROVIDER_SITE_OTHER): Payer: Medicare HMO | Admitting: Internal Medicine

## 2019-11-18 ENCOUNTER — Other Ambulatory Visit (HOSPITAL_COMMUNITY)
Admission: RE | Admit: 2019-11-18 | Discharge: 2019-11-18 | Disposition: A | Discharge status: Home / Self Care | Payer: Medicare HMO | Source: Ambulatory Visit | Attending: Internal Medicine | Admitting: Internal Medicine

## 2019-11-18 ENCOUNTER — Other Ambulatory Visit: Payer: Self-pay | Admitting: Internal Medicine

## 2019-11-18 ENCOUNTER — Encounter: Payer: Self-pay | Admitting: Internal Medicine

## 2019-11-18 VITALS — BP 120/80 | HR 84 | Temp 97.9°F | Ht 66.0 in | Wt 192.4 lb

## 2019-11-18 DIAGNOSIS — Z202 Contact with and (suspected) exposure to infections with a predominantly sexual mode of transmission: Secondary | ICD-10-CM | POA: Diagnosis not present

## 2019-11-18 DIAGNOSIS — Z0000 Encounter for general adult medical examination without abnormal findings: Secondary | ICD-10-CM

## 2019-11-18 DIAGNOSIS — I10 Essential (primary) hypertension: Secondary | ICD-10-CM

## 2019-11-18 DIAGNOSIS — Z1211 Encounter for screening for malignant neoplasm of colon: Secondary | ICD-10-CM

## 2019-11-18 DIAGNOSIS — G8110 Spastic hemiplegia affecting unspecified side: Secondary | ICD-10-CM | POA: Diagnosis not present

## 2019-11-18 DIAGNOSIS — R7989 Other specified abnormal findings of blood chemistry: Secondary | ICD-10-CM

## 2019-11-18 DIAGNOSIS — E119 Type 2 diabetes mellitus without complications: Secondary | ICD-10-CM

## 2019-11-18 DIAGNOSIS — Z1231 Encounter for screening mammogram for malignant neoplasm of breast: Secondary | ICD-10-CM

## 2019-11-18 DIAGNOSIS — K219 Gastro-esophageal reflux disease without esophagitis: Secondary | ICD-10-CM | POA: Diagnosis not present

## 2019-11-18 DIAGNOSIS — Z1151 Encounter for screening for human papillomavirus (HPV): Secondary | ICD-10-CM | POA: Diagnosis not present

## 2019-11-18 DIAGNOSIS — I639 Cerebral infarction, unspecified: Secondary | ICD-10-CM | POA: Diagnosis not present

## 2019-11-18 DIAGNOSIS — H6123 Impacted cerumen, bilateral: Secondary | ICD-10-CM

## 2019-11-18 LAB — COMPREHENSIVE METABOLIC PANEL
ALT: 18 U/L (ref 0–35)
AST: 16 U/L (ref 0–37)
Albumin: 4.3 g/dL (ref 3.5–5.2)
Alkaline Phosphatase: 93 U/L (ref 39–117)
BUN: 9 mg/dL (ref 6–23)
CO2: 28 mEq/L (ref 19–32)
Calcium: 9.8 mg/dL (ref 8.4–10.5)
Chloride: 103 mEq/L (ref 96–112)
Creatinine, Ser: 0.69 mg/dL (ref 0.40–1.20)
GFR: 108.22 mL/min (ref >60.00)
Glucose, Bld: 93 mg/dL (ref 70–99)
Potassium: 4.2 mEq/L (ref 3.5–5.1)
Sodium: 140 mEq/L (ref 135–145)
Total Bilirubin: 0.3 mg/dL (ref 0.2–1.2)
Total Protein: 7.3 g/dL (ref 6.0–8.3)

## 2019-11-18 LAB — LIPID PANEL
Cholesterol: 107 mg/dL (ref 0–200)
HDL: 46.3 mg/dL (ref >39.00)
LDL Cholesterol: 46 mg/dL (ref 0–99)
NonHDL: 61.16
Total CHOL/HDL Ratio: 2
Triglycerides: 77 mg/dL (ref 0.0–149.0)
VLDL: 15.4 mg/dL (ref 0.0–40.0)

## 2019-11-18 LAB — VITAMIN D 25 HYDROXY (VIT D DEFICIENCY, FRACTURES): VITD: 39.72 ng/mL (ref 30.00–100.00)

## 2019-11-18 LAB — CBC WITH DIFFERENTIAL/PLATELET
Basophils Absolute: 0 10*3/uL (ref 0.0–0.1)
Basophils Relative: 0.3 % (ref 0.0–3.0)
Eosinophils Absolute: 0.4 10*3/uL (ref 0.0–0.7)
Eosinophils Relative: 6.7 % — ABNORMAL HIGH (ref 0.0–5.0)
HCT: 32.7 % — ABNORMAL LOW (ref 36.0–46.0)
Hemoglobin: 10.7 g/dL — ABNORMAL LOW (ref 12.0–15.0)
Lymphocytes Relative: 38.7 % (ref 12.0–46.0)
Lymphs Abs: 2.4 10*3/uL (ref 0.7–4.0)
MCHC: 32.7 g/dL (ref 30.0–36.0)
MCV: 84.3 fl (ref 78.0–100.0)
Monocytes Absolute: 0.8 10*3/uL (ref 0.1–1.0)
Monocytes Relative: 12.6 % — ABNORMAL HIGH (ref 3.0–12.0)
Neutro Abs: 2.6 10*3/uL (ref 1.4–7.7)
Neutrophils Relative %: 41.7 % — ABNORMAL LOW (ref 43.0–77.0)
Platelets: 380 10*3/uL (ref 150.0–400.0)
RBC: 3.87 Mil/uL (ref 3.87–5.11)
RDW: 17 % — ABNORMAL HIGH (ref 11.5–15.5)
WBC: 6.1 10*3/uL (ref 4.0–10.5)

## 2019-11-18 LAB — TSH: TSH: 4.66 u[IU]/mL — ABNORMAL HIGH (ref 0.35–4.50)

## 2019-11-18 LAB — VITAMIN B12: Vitamin B-12: >1500 pg/mL — ABNORMAL HIGH (ref 211–911)

## 2019-11-18 LAB — HEMOGLOBIN A1C: Hgb A1c MFr Bld: 6.7 % — ABNORMAL HIGH (ref 4.6–6.5)

## 2019-11-18 NOTE — Patient Instructions (Signed)
-Nice seeing you today!!  -Lab work today; will notify you once results are available.  -Will set you up for a mammogram and a GI referral for your colonoscopy today.  -Pap smear today.   Preventive Care 52-52 Years Old, Female Preventive care refers to visits with your health care provider and lifestyle choices that can promote health and wellness. This includes:  A yearly physical exam. This may also be called an annual well check.  Regular dental visits and eye exams.  Immunizations.  Screening for certain conditions.  Healthy lifestyle choices, such as eating a healthy diet, getting regular exercise, not using drugs or products that contain nicotine and tobacco, and limiting alcohol use. What can I expect for my preventive care visit? Physical exam Your health care provider will check your:  Height and weight. This may be used to calculate body mass index (BMI), which tells if you are at a healthy weight.  Heart rate and blood pressure.  Skin for abnormal spots. Counseling Your health care provider may ask you questions about your:  Alcohol, tobacco, and drug use.  Emotional well-being.  Home and relationship well-being.  Sexual activity.  Eating habits.  Work and work Statistician.  Method of birth control.  Menstrual cycle.  Pregnancy history. What immunizations do I need?  Influenza (flu) vaccine  This is recommended every year. Tetanus, diphtheria, and pertussis (Tdap) vaccine  You may need a Td booster every 10 years. Varicella (chickenpox) vaccine  You may need this if you have not been vaccinated. Zoster (shingles) vaccine  You may need this after age 60. Measles, mumps, and rubella (MMR) vaccine  You may need at least one dose of MMR if you were born in 1957 or later. You may also need a second dose. Pneumococcal conjugate (PCV13) vaccine  You may need this if you have certain conditions and were not previously vaccinated. Pneumococcal  polysaccharide (PPSV23) vaccine  You may need one or two doses if you smoke cigarettes or if you have certain conditions. Meningococcal conjugate (MenACWY) vaccine  You may need this if you have certain conditions. Hepatitis A vaccine  You may need this if you have certain conditions or if you travel or work in places where you may be exposed to hepatitis A. Hepatitis B vaccine  You may need this if you have certain conditions or if you travel or work in places where you may be exposed to hepatitis B. Haemophilus influenzae type b (Hib) vaccine  You may need this if you have certain conditions. Human papillomavirus (HPV) vaccine  If recommended by your health care provider, you may need three doses over 6 months. You may receive vaccines as individual doses or as more than one vaccine together in one shot (combination vaccines). Talk with your health care provider about the risks and benefits of combination vaccines. What tests do I need? Blood tests  Lipid and cholesterol levels. These may be checked every 5 years, or more frequently if you are over 52 years old.  Hepatitis C test.  Hepatitis B test. Screening  Lung cancer screening. You may have this screening every year starting at age 52 if you have a 30-pack-year history of smoking and currently smoke or have quit within the past 15 years.  Colorectal cancer screening. All adults should have this screening starting at age 52 and continuing until age 24. Your health care provider may recommend screening at age 31 if you are at increased risk. You will have tests every  1-10 years, depending on your results and the type of screening test.  Diabetes screening. This is done by checking your blood sugar (glucose) after you have not eaten for a while (fasting). You may have this done every 1-3 years.  Mammogram. This may be done every 1-2 years. Talk with your health care provider about when you should start having regular  mammograms. This may depend on whether you have a family history of breast cancer.  BRCA-related cancer screening. This may be done if you have a family history of breast, ovarian, tubal, or peritoneal cancers.  Pelvic exam and Pap test. This may be done every 3 years starting at age 52. Starting at age 66, this may be done every 5 years if you have a Pap test in combination with an HPV test. Other tests  Sexually transmitted disease (STD) testing.  Bone density scan. This is done to screen for osteoporosis. You may have this scan if you are at high risk for osteoporosis. Follow these instructions at home: Eating and drinking  Eat a diet that includes fresh fruits and vegetables, whole grains, lean protein, and low-fat dairy.  Take vitamin and mineral supplements as recommended by your health care provider.  Do not drink alcohol if: ? Your health care provider tells you not to drink. ? You are pregnant, may be pregnant, or are planning to become pregnant.  If you drink alcohol: ? Limit how much you have to 0-1 drink a day. ? Be aware of how much alcohol is in your drink. In the [place] one drink equals one 12 oz bottle of beer (355 mL), one 5 oz glass of wine (148 mL), or one 1 oz glass of hard liquor (44 mL). Lifestyle  Take daily care of your teeth and gums.  Stay active. Exercise for at least 30 minutes on 5 or more days each week.  Do not use any products that contain nicotine or tobacco, such as cigarettes, e-cigarettes, and chewing tobacco. If you need help quitting, ask your health care provider.  If you are sexually active, practice safe sex. Use a condom or other form of birth control (contraception) in order to prevent pregnancy and STIs (sexually transmitted infections).  If told by your health care provider, take low-dose aspirin daily starting at age 47. What's next?  Visit your health care provider once a year for a well check visit.  Ask your health care provider  how often you should have your eyes and teeth checked.  Stay up to date on all vaccines. This information is not intended to replace advice given to you by your health care provider. Make sure you discuss any questions you have with your health care provider. Document Revised: 04/08/2018 Document Reviewed: 04/08/2018 Elsevier Patient Education  2020 [employer].

## 2019-11-18 NOTE — Addendum Note (Signed)
Addended by: Westley Hummer B on: 11/18/2019 11:31 AM   Modules accepted: Orders

## 2019-11-18 NOTE — Addendum Note (Signed)
Addended by: Elmer Picker on: 11/18/2019 10:26 AM   Modules accepted: Orders

## 2019-11-18 NOTE — Progress Notes (Signed)
Established Patient Office Visit     This visit occurred during the SARS-CoV-2 public health emergency.  Safety protocols were in place, including screening questions prior to the visit, additional usage of staff PPE, and extensive cleaning of exam room while observing appropriate contact time as indicated for disinfecting solutions.    CC/Reason for Visit: Annual preventive exam  HPI: Rebecca Yoder is a 54 y.o. female who is coming in today for the above mentioned reasons. Past Medical History is significant for:  right MCA CVA in 2016 with significant residual left spastic hemiplegia managed by Dr. Letta Pate with Botox injections. She also has well-controlled hypertension, hyperlipidemia and diabetes.    Since I last saw her she received her first Covid vaccine and has her second month scheduled.  She has been having some hearing difficulties.  She has routine eye and dental care.  She works out on a stationary bike for an hour 3 times a week.  She is due for a Pap smear.  She is also due for mammogram and colonoscopy.  She has no acute complaints today.   Past Medical/Surgical History: Past Medical History:  Diagnosis Date  . ASTHMA 12/10/2009  . CEREBROVASCULAR ACCIDENT, HX OF 12/10/2009  . Complication of anesthesia    "just can't get me up" (06/09/2013)  . Embolic stroke of right basal ganglia ([place]) 06/11/2015   with L residual weakness/hemiparesis   . HYPERLIPIDEMIA 12/10/2009  . HYPERTENSION 12/10/2009  . Stroke [facility]) 2008   denies residual on 06/09/2013  . Tendonitis of wrist, right     Past Surgical History:  Procedure Laterality Date  . COSMETIC SURGERY    . OVARIAN CYST REMOVAL  1990's    Social History:  reports that she has never smoked. She has never used smokeless tobacco. She reports that she does not drink alcohol or use drugs.  Allergies: Allergies  Allergen Reactions  . Penicillins Rash    Family History:  Family History  Problem Relation Age  of Onset  . Multiple myeloma Mother      Current Outpatient Medications:  .  acetaminophen (TYLENOL) 325 MG tablet, Take 2 tablets (650 mg total) by mouth every 6 (six) hours as needed., Disp: 20 tablet, Rfl: 0 .  albuterol (PROAIR HFA) 108 (90 Base) MCG/ACT inhaler, INHALE 2 PUFFS INTO THE LUNGS EVERY 6 HOURS AS NEEDED FOR WHEEZING., Disp: 8.5 g, Rfl: 1 .  amLODipine (NORVASC) 10 MG tablet, Take 1 tablet (10 mg total) by mouth daily., Disp: 90 tablet, Rfl: 1 .  atorvastatin (LIPITOR) 40 MG tablet, Take 1 tablet (40 mg total) by mouth daily., Disp: 90 tablet, Rfl: 1 .  baclofen (LIORESAL) 10 MG tablet, Take 1 tablet (10 mg total) by mouth 2 (two) times daily as needed for muscle spasms., Disp: 60 tablet, Rfl: 2 .  carvedilol (COREG) 3.125 MG tablet, Take 1 tablet (3.125 mg total) by mouth 2 (two) times daily with a meal., Disp: 180 tablet, Rfl: 1 .  clopidogrel (PLAVIX) 75 MG tablet, Take 1 tablet (75 mg total) by mouth daily., Disp: 30 tablet, Rfl: 6 .  diphenhydrAMINE (BENADRYL) 25 MG tablet, Take 50 mg by mouth at bedtime., Disp: , Rfl:  .  losartan (COZAAR) 100 MG tablet, Take 1 tablet by mouth once daily, Disp: 90 tablet, Rfl: 0 .  Multiple Vitamin (MULTIVITAMIN WITH MINERALS) TABS tablet, Take 1 tablet by mouth daily., Disp: , Rfl:  .  pantoprazole (PROTONIX) 40 MG tablet, TAKE 1 TABLET  BY MOUTH prn, Disp: 90 tablet, Rfl: 1 .  polyethylene glycol (MIRALAX / GLYCOLAX) packet, Take 17 g by mouth daily., Disp: 30 each, Rfl: 1 .  triamcinolone ointment (KENALOG) 0.5 %, Apply 1 application topically 2 (two) times daily., Disp: 30 g, Rfl: 0  Review of Systems:  Constitutional: Denies fever, chills, diaphoresis, appetite change and fatigue.  HEENT: Denies photophobia, eye pain, redness, hearing loss, ear pain, congestion, sore throat, rhinorrhea, sneezing, mouth sores, trouble swallowing, neck pain, neck stiffness and tinnitus.   Respiratory: Denies SOB, DOE, cough, chest tightness,  and  wheezing.   Cardiovascular: Denies chest pain, palpitations and leg swelling.  Gastrointestinal: Denies nausea, vomiting, abdominal pain, diarrhea, constipation, blood in stool and abdominal distention.  Genitourinary: Denies dysuria, urgency, frequency, hematuria, flank pain and difficulty urinating.  Endocrine: Denies: hot or cold intolerance, sweats, changes in hair or nails, polyuria, polydipsia. Musculoskeletal: Denies myalgias, back pain, joint swelling, arthralgias and gait problem.  Skin: Denies pallor, rash and wound.  Neurological: Denies dizziness, seizures, syncope, weakness, light-headedness, numbness and headaches.  Hematological: Denies adenopathy. Easy bruising, personal or family bleeding history  Psychiatric/Behavioral: Denies suicidal ideation, mood changes, confusion, nervousness, sleep disturbance and agitation    Physical Exam: Vitals:   11/18/19 0910  BP: 120/80  Pulse: 84  Temp: 97.9 F (36.6 C)  TempSrc: Temporal  SpO2: 98%  Weight: 192 lb 6.4 oz (87.3 kg)  Height: '5\' 6"'$  (1.676 m)    Body mass index is 31.05 kg/m.   Constitutional: NAD, calm, comfortable Eyes: PERRL, lids and conjunctivae normal, wears corrective lenses ENMT: Mucous membranes are moist.  Tympanic membrane is obstructed by cerumen bilaterally  Neck: normal, supple, no masses, no thyromegaly Respiratory: clear to auscultation bilaterally, no wheezing, no crackles. Normal respiratory effort. No accessory muscle use.  Cardiovascular: Regular rate and rhythm, no murmurs / rubs / gallops. No extremity edema.  Abdomen: no tenderness, no masses palpated. No hepatosplenomegaly. Bowel sounds positive.  Musculoskeletal: Wears AFOs bilaterally. Psychiatric: Normal judgment and insight. Alert and oriented x 3. Normal mood.    Impression and Plan:  Encounter for preventive health examination  -She has routine eye and dental care. -She has had her first Covid vaccine.,  She is due for shingles  vaccination series but will delay for 6 weeks after her last Covid vaccine. -Screening labs today. -Healthy lifestyle discussed in detail. -GI referral for screening colonoscopy. -Mammogram ordered today as she is overdue. -Pap smear done in office today, while performing, I have noticed a large amount of thick white discharge, wet prep was added.  Spastic hemiplegia affecting nondominant side (HCC) -Continue follow-up with Dr. Letta Pate as scheduled.  Embolic stroke of right basal ganglia (HCC)  -Noted.  Controlled type 2 diabetes mellitus without complication, without long-term current use of insulin ([place])  - Plan: Hemoglobin A1c -Most recent A1c was 6.3 in December.  Essential hypertension  -Well-controlled.  Gastroesophageal reflux disease, unspecified whether esophagitis present -Well-controlled on daily PPI therapy.  Impacted cerumen, bilateral -Cerumen Desimpaction  After obtaining patient consent, warm water was applied and gentle ear lavage performed on bilateral ears. There were no complications and following the desimpaction the tympanic membranes were visible. Tympanic membranes are intact following the procedure. Auditory canals are normal. The patient reported relief of symptoms after removal of cerumen.    Patient Instructions  -Nice seeing you today!!  -Lab work today; will notify you once results are available.  -Will set you up for a mammogram and a GI referral  for your colonoscopy today.  -Pap smear today.   Preventive Care 59-75 Years Old, Female Preventive care refers to visits with your health care provider and lifestyle choices that can promote health and wellness. This includes:  A yearly physical exam. This may also be called an annual well check.  Regular dental visits and eye exams.  Immunizations.  Screening for certain conditions.  Healthy lifestyle choices, such as eating a healthy diet, getting regular exercise, not using drugs or  products that contain nicotine and tobacco, and limiting alcohol use. What can I expect for my preventive care visit? Physical exam Your health care provider will check your:  Height and weight. This may be used to calculate body mass index (BMI), which tells if you are at a healthy weight.  Heart rate and blood pressure.  Skin for abnormal spots. Counseling Your health care provider may ask you questions about your:  Alcohol, tobacco, and drug use.  Emotional well-being.  Home and relationship well-being.  Sexual activity.  Eating habits.  Work and work Statistician.  Method of birth control.  Menstrual cycle.  Pregnancy history. What immunizations do I need?  Influenza (flu) vaccine  This is recommended every year. Tetanus, diphtheria, and pertussis (Tdap) vaccine  You may need a Td booster every 10 years. Varicella (chickenpox) vaccine  You may need this if you have not been vaccinated. Zoster (shingles) vaccine  You may need this after age 71. Measles, mumps, and rubella (MMR) vaccine  You may need at least one dose of MMR if you were born in 1957 or later. You may also need a second dose. Pneumococcal conjugate (PCV13) vaccine  You may need this if you have certain conditions and were not previously vaccinated. Pneumococcal polysaccharide (PPSV23) vaccine  You may need one or two doses if you smoke cigarettes or if you have certain conditions. Meningococcal conjugate (MenACWY) vaccine  You may need this if you have certain conditions. Hepatitis A vaccine  You may need this if you have certain conditions or if you travel or work in places where you may be exposed to hepatitis A. Hepatitis B vaccine  You may need this if you have certain conditions or if you travel or work in places where you may be exposed to hepatitis B. Haemophilus influenzae type b (Hib) vaccine  You may need this if you have certain conditions. Human papillomavirus (HPV)  vaccine  If recommended by your health care provider, you may need three doses over 6 months. You may receive vaccines as individual doses or as more than one vaccine together in one shot (combination vaccines). Talk with your health care provider about the risks and benefits of combination vaccines. What tests do I need? Blood tests  Lipid and cholesterol levels. These may be checked every 5 years, or more frequently if you are over 43 years old.  Hepatitis C test.  Hepatitis B test. Screening  Lung cancer screening. You may have this screening every year starting at age 4 if you have a 30-pack-year history of smoking and currently smoke or have quit within the past 15 years.  Colorectal cancer screening. All adults should have this screening starting at age 4 and continuing until age 74. Your health care provider may recommend screening at age 15 if you are at increased risk. You will have tests every 1-10 years, depending on your results and the type of screening test.  Diabetes screening. This is done by checking your blood sugar (glucose) after you  have not eaten for a while (fasting). You may have this done every 1-3 years.  Mammogram. This may be done every 1-2 years. Talk with your health care provider about when you should start having regular mammograms. This may depend on whether you have a family history of breast cancer.  BRCA-related cancer screening. This may be done if you have a family history of breast, ovarian, tubal, or peritoneal cancers.  Pelvic exam and Pap test. This may be done every 3 years starting at age 96. Starting at age 57, this may be done every 5 years if you have a Pap test in combination with an HPV test. Other tests  Sexually transmitted disease (STD) testing.  Bone density scan. This is done to screen for osteoporosis. You may have this scan if you are at high risk for osteoporosis. Follow these instructions at home: Eating and drinking  Eat a  diet that includes fresh fruits and vegetables, whole grains, lean protein, and low-fat dairy.  Take vitamin and mineral supplements as recommended by your health care provider.  Do not drink alcohol if: ? Your health care provider tells you not to drink. ? You are pregnant, may be pregnant, or are planning to become pregnant.  If you drink alcohol: ? Limit how much you have to 0-1 drink a day. ? Be aware of how much alcohol is in your drink. In the [place] one drink equals one 12 oz bottle of beer (355 mL), one 5 oz glass of wine (148 mL), or one 1 oz glass of hard liquor (44 mL). Lifestyle  Take daily care of your teeth and gums.  Stay active. Exercise for at least 30 minutes on 5 or more days each week.  Do not use any products that contain nicotine or tobacco, such as cigarettes, e-cigarettes, and chewing tobacco. If you need help quitting, ask your health care provider.  If you are sexually active, practice safe sex. Use a condom or other form of birth control (contraception) in order to prevent pregnancy and STIs (sexually transmitted infections).  If told by your health care provider, take low-dose aspirin daily starting at age 73. What's next?  Visit your health care provider once a year for a well check visit.  Ask your health care provider how often you should have your eyes and teeth checked.  Stay up to date on all vaccines. This information is not intended to replace advice given to you by your health care provider. Make sure you discuss any questions you have with your health care provider. Document Revised: 04/08/2018 Document Reviewed: 04/08/2018 Elsevier Patient Education  2020 [place], [place] [facility]

## 2019-11-19 ENCOUNTER — Other Ambulatory Visit: Payer: Self-pay | Admitting: Internal Medicine

## 2019-11-19 DIAGNOSIS — I10 Essential (primary) hypertension: Secondary | ICD-10-CM

## 2019-11-19 DIAGNOSIS — E119 Type 2 diabetes mellitus without complications: Secondary | ICD-10-CM

## 2019-11-21 LAB — CERVICOVAGINAL ANCILLARY ONLY
Bacterial Vaginitis (gardnerella): NEGATIVE
Candida Glabrata: NEGATIVE
Candida Vaginitis: NEGATIVE
Chlamydia: NEGATIVE
Comment: NEGATIVE
Comment: NEGATIVE
Comment: NEGATIVE
Comment: NEGATIVE
Comment: NEGATIVE
Comment: NORMAL
Neisseria Gonorrhea: NEGATIVE
Trichomonas: NEGATIVE

## 2019-11-22 ENCOUNTER — Other Ambulatory Visit: Payer: Self-pay | Admitting: Internal Medicine

## 2019-11-22 DIAGNOSIS — R7989 Other specified abnormal findings of blood chemistry: Secondary | ICD-10-CM

## 2019-11-22 LAB — CYTOLOGY - PAP
Adequacy: ABSENT
Comment: NEGATIVE
Diagnosis: NEGATIVE
High risk HPV: NEGATIVE

## 2019-11-23 ENCOUNTER — Other Ambulatory Visit (INDEPENDENT_AMBULATORY_CARE_PROVIDER_SITE_OTHER): Payer: Medicare HMO

## 2019-11-23 ENCOUNTER — Other Ambulatory Visit: Payer: Self-pay

## 2019-11-23 DIAGNOSIS — R7989 Other specified abnormal findings of blood chemistry: Secondary | ICD-10-CM

## 2019-11-23 LAB — T3, FREE: T3, Free: 3.2 pg/mL (ref 2.3–4.2)

## 2019-11-23 LAB — T4, FREE: Free T4: 0.64 ng/dL (ref 0.60–1.60)

## 2019-11-24 ENCOUNTER — Other Ambulatory Visit: Payer: Self-pay

## 2019-11-24 ENCOUNTER — Other Ambulatory Visit: Payer: Self-pay | Admitting: Internal Medicine

## 2019-11-24 DIAGNOSIS — R7989 Other specified abnormal findings of blood chemistry: Secondary | ICD-10-CM

## 2019-12-02 ENCOUNTER — Other Ambulatory Visit: Payer: Self-pay | Admitting: Internal Medicine

## 2019-12-02 ENCOUNTER — Other Ambulatory Visit: Payer: Self-pay

## 2019-12-02 ENCOUNTER — Ambulatory Visit
Admission: RE | Admit: 2019-12-02 | Discharge: 2019-12-02 | Disposition: A | Discharge status: Home / Self Care | Payer: Medicare HMO | Source: Ambulatory Visit | Attending: Internal Medicine | Admitting: Internal Medicine

## 2019-12-02 DIAGNOSIS — Z1231 Encounter for screening mammogram for malignant neoplasm of breast: Secondary | ICD-10-CM | POA: Diagnosis not present

## 2019-12-12 ENCOUNTER — Other Ambulatory Visit: Payer: Self-pay

## 2019-12-13 ENCOUNTER — Ambulatory Visit (INDEPENDENT_AMBULATORY_CARE_PROVIDER_SITE_OTHER): Payer: Medicare HMO | Admitting: Internal Medicine

## 2019-12-13 ENCOUNTER — Encounter: Payer: Self-pay | Admitting: Internal Medicine

## 2019-12-13 VITALS — BP 120/80 | HR 103 | Temp 97.9°F | Wt 189.4 lb

## 2019-12-13 DIAGNOSIS — J4530 Mild persistent asthma, uncomplicated: Secondary | ICD-10-CM | POA: Diagnosis not present

## 2019-12-13 MED ORDER — ALBUTEROL SULFATE HFA 108 (90 BASE) MCG/ACT IN AERS: INHALATION_SPRAY | RESPIRATORY_TRACT | 1 refills | Status: DC

## 2019-12-13 NOTE — Progress Notes (Signed)
Established Patient Office Visit     This visit occurred during the SARS-CoV-2 public health emergency.  Safety protocols were in place, including screening questions prior to the visit, additional usage of staff PPE, and extensive cleaning of exam room while observing appropriate contact time as indicated for disinfecting solutions.    CC/Reason for Visit: Increased asthma symptoms  HPI: Rebecca Yoder is a 54 y.o. female who is coming in today for the above mentioned reasons.  She was seen earlier this month for her physical.  In the last 2 to 3 weeks she is having increased, wheezing and some shortness of breath.  She has been using her albuterol inhaler 3-4 times a week.  She also describes watery eyes, increased sneezing, nasal congestion and postnasal drip.  She denies fever, chest pains.   Past Medical/Surgical History: Past Medical History:  Diagnosis Date  . ASTHMA 12/10/2009  . CEREBROVASCULAR ACCIDENT, HX OF 12/10/2009  . Complication of anesthesia    "just can't get me up" (06/09/2013)  . Embolic stroke of right basal ganglia ([place]) 06/11/2015   with L residual weakness/hemiparesis   . HYPERLIPIDEMIA 12/10/2009  . HYPERTENSION 12/10/2009  . Stroke [facility]) 2008   denies residual on 06/09/2013  . Tendonitis of wrist, right     Past Surgical History:  Procedure Laterality Date  . COSMETIC SURGERY    . OVARIAN CYST REMOVAL  1990's    Social History:  reports that she has never smoked. She has never used smokeless tobacco. She reports that she does not drink alcohol or use drugs.  Allergies: Allergies  Allergen Reactions  . Penicillins Rash    Family History:  Family History  Problem Relation Age of Onset  . Multiple myeloma Mother      Current Outpatient Medications:  .  acetaminophen (TYLENOL) 325 MG tablet, Take 2 tablets (650 mg total) by mouth every 6 (six) hours as needed., Disp: 20 tablet, Rfl: 0 .  albuterol (PROAIR HFA) 108 (90 Base) MCG/ACT  inhaler, INHALE 2 PUFFS INTO THE LUNGS EVERY 6 HOURS AS NEEDED FOR WHEEZING., Disp: 8.5 g, Rfl: 1 .  amLODipine (NORVASC) 10 MG tablet, Take 1 tablet (10 mg total) by mouth daily., Disp: 90 tablet, Rfl: 1 .  atorvastatin (LIPITOR) 40 MG tablet, Take 1 tablet by mouth once daily, Disp: 90 tablet, Rfl: 0 .  baclofen (LIORESAL) 10 MG tablet, Take 1 tablet (10 mg total) by mouth 2 (two) times daily as needed for muscle spasms., Disp: 60 tablet, Rfl: 2 .  carvedilol (COREG) 3.125 MG tablet, TAKE 1 TABLET BY MOUTH TWICE DAILY WITH MEALS, Disp: 180 tablet, Rfl: 0 .  clopidogrel (PLAVIX) 75 MG tablet, Take 1 tablet (75 mg total) by mouth daily., Disp: 30 tablet, Rfl: 6 .  diphenhydrAMINE (BENADRYL) 25 MG tablet, Take 50 mg by mouth at bedtime., Disp: , Rfl:  .  losartan (COZAAR) 100 MG tablet, Take 1 tablet by mouth once daily, Disp: 90 tablet, Rfl: 0 .  Multiple Vitamin (MULTIVITAMIN WITH MINERALS) TABS tablet, Take 1 tablet by mouth daily., Disp: , Rfl:  .  pantoprazole (PROTONIX) 40 MG tablet, TAKE 1 TABLET BY MOUTH prn, Disp: 90 tablet, Rfl: 1 .  polyethylene glycol (MIRALAX / GLYCOLAX) packet, Take 17 g by mouth daily., Disp: 30 each, Rfl: 1 .  triamcinolone ointment (KENALOG) 0.5 %, Apply 1 application topically 2 (two) times daily., Disp: 30 g, Rfl: 0  Review of Systems:  Constitutional: Denies fever, chills, diaphoresis,  appetite change and fatigue.  HEENT: Denies photophobia, eye pain, redness, hearing loss, ear pain,  mouth sores, trouble swallowing, neck pain, neck stiffness and tinnitus.   Respiratory: Denies chest tightness. Cardiovascular: Denies chest pain, palpitations and leg swelling.  Gastrointestinal: Denies nausea, vomiting, abdominal pain, diarrhea, constipation, blood in stool and abdominal distention.  Genitourinary: Denies dysuria, urgency, frequency, hematuria, flank pain and difficulty urinating.  Endocrine: Denies: hot or cold intolerance, sweats, changes in hair or nails,  polyuria, polydipsia. Musculoskeletal: Denies myalgias, back pain, joint swelling, arthralgias and gait problem.  Skin: Denies pallor, rash and wound.  Neurological: Denies dizziness, seizures, syncope, weakness, light-headedness, numbness and headaches.  Hematological: Denies adenopathy. Easy bruising, personal or family bleeding history  Psychiatric/Behavioral: Denies suicidal ideation, mood changes, confusion, nervousness, sleep disturbance and agitation    Physical Exam: Vitals:   12/13/19 1108  BP: 120/80  Pulse: (!) 103  Temp: 97.9 F (36.6 C)  TempSrc: Temporal  SpO2: 97%  Weight: 189 lb 6.4 oz (85.9 kg)    Body mass index is 30.57 kg/m.   Constitutional: NAD, calm, comfortable Eyes: PERRL, lids and conjunctivae normal, wears corrective lenses ENMT: Mucous membranes are moist. Respiratory: clear to auscultation bilaterally, no wheezing, no crackles. Normal respiratory effort. No accessory muscle use.  Cardiovascular: Regular rate and rhythm, no murmurs / rubs / gallops. No extremity edema.  Psychiatric: Normal judgment and insight. Alert and oriented x 3. Normal mood.    Impression and Plan:  Mild persistent asthma without complication  -Suspect increase in asthma symptoms due to seasonal allergies. -Have recommended a daily antihistamine, Mucinex, I will also refill her albuterol.  She is asked to follow-up with me if further issues. -Given normal lung auscultation today, do not believe chest x-ray is necessary.    Patient Instructions  -Nice seeing you today!!  -Refills of your albuterol inhaler have been sent.  -Start zyrtec daily and mucinex twice daily.     Lelon Frohlich, MD [facility]

## 2019-12-13 NOTE — Patient Instructions (Addendum)
-  Nice seeing you today!!  -Refills of your albuterol inhaler have been sent.  -Start zyrtec daily and mucinex twice daily.

## 2019-12-21 ENCOUNTER — Encounter: Payer: Self-pay | Admitting: Gastroenterology

## 2019-12-29 ENCOUNTER — Ambulatory Visit: Payer: Medicare HMO | Admitting: Physical Medicine & Rehabilitation

## 2020-01-03 ENCOUNTER — Other Ambulatory Visit: Payer: Self-pay

## 2020-01-12 ENCOUNTER — Encounter: Payer: Self-pay | Admitting: Physical Medicine & Rehabilitation

## 2020-01-12 ENCOUNTER — Encounter: Payer: Medicare HMO | Attending: Physical Medicine & Rehabilitation | Admitting: Physical Medicine & Rehabilitation

## 2020-01-12 ENCOUNTER — Other Ambulatory Visit: Payer: Self-pay

## 2020-01-12 VITALS — BP 136/87 | HR 84 | Temp 98.7°F | Ht 66.0 in | Wt 192.8 lb

## 2020-01-12 DIAGNOSIS — Z8673 Personal history of transient ischemic attack (TIA), and cerebral infarction without residual deficits: Secondary | ICD-10-CM | POA: Diagnosis present

## 2020-01-12 DIAGNOSIS — R269 Unspecified abnormalities of gait and mobility: Secondary | ICD-10-CM | POA: Diagnosis present

## 2020-01-12 DIAGNOSIS — M7542 Impingement syndrome of left shoulder: Secondary | ICD-10-CM | POA: Diagnosis not present

## 2020-01-12 DIAGNOSIS — E785 Hyperlipidemia, unspecified: Secondary | ICD-10-CM | POA: Insufficient documentation

## 2020-01-12 DIAGNOSIS — G8110 Spastic hemiplegia affecting unspecified side: Secondary | ICD-10-CM | POA: Diagnosis not present

## 2020-01-12 DIAGNOSIS — G8194 Hemiplegia, unspecified affecting left nondominant side: Secondary | ICD-10-CM | POA: Diagnosis present

## 2020-01-12 DIAGNOSIS — I69354 Hemiplegia and hemiparesis following cerebral infarction affecting left non-dominant side: Secondary | ICD-10-CM | POA: Diagnosis not present

## 2020-01-12 DIAGNOSIS — M7502 Adhesive capsulitis of left shoulder: Secondary | ICD-10-CM | POA: Diagnosis not present

## 2020-01-12 DIAGNOSIS — S43002A Unspecified subluxation of left shoulder joint, initial encounter: Secondary | ICD-10-CM | POA: Insufficient documentation

## 2020-01-12 DIAGNOSIS — I10 Essential (primary) hypertension: Secondary | ICD-10-CM | POA: Insufficient documentation

## 2020-01-12 DIAGNOSIS — M25512 Pain in left shoulder: Secondary | ICD-10-CM | POA: Diagnosis not present

## 2020-01-12 DIAGNOSIS — Z79899 Other long term (current) drug therapy: Secondary | ICD-10-CM | POA: Diagnosis not present

## 2020-01-12 NOTE — Progress Notes (Signed)
Botox Injection for spasticity using needle EMG guidance  Dilution: 50 Units/ml Indication: Severe spasticity which interferes with ADL,mobility and/or  hygiene and is unresponsive to medication management and other conservative care Informed consent was obtained after describing risks and benefits of the procedure with the patient. This includes bleeding, bruising, infection, excessive weakness, or medication side effects. A REMS form is on file and signed. Needle: 50mm 25g  needle electrode Number of units per muscle    Posterior tibialis 75 Gastroc medial 75  FHL 50 FDL 75 EHL 25 All injections were done after obtaining appropriate EMG activity and after negative drawback for blood. The patient tolerated the procedure well. Post procedure instructions were given. A followup appointment was made.  

## 2020-01-12 NOTE — Patient Instructions (Signed)

## 2020-01-16 ENCOUNTER — Other Ambulatory Visit: Payer: Self-pay | Admitting: Family Medicine

## 2020-01-16 DIAGNOSIS — I63511 Cerebral infarction due to unspecified occlusion or stenosis of right middle cerebral artery: Secondary | ICD-10-CM

## 2020-01-18 ENCOUNTER — Telehealth: Payer: Self-pay | Admitting: Internal Medicine

## 2020-01-18 ENCOUNTER — Other Ambulatory Visit: Payer: Self-pay

## 2020-01-18 DIAGNOSIS — I63511 Cerebral infarction due to unspecified occlusion or stenosis of right middle cerebral artery: Secondary | ICD-10-CM

## 2020-01-18 MED ORDER — CLOPIDOGREL BISULFATE 75 MG PO TABS: 75.0000 mg | ORAL_TABLET | Freq: Every day | ORAL | 6 refills | Status: DC

## 2020-01-18 NOTE — Telephone Encounter (Signed)
Noted! Rx refilled. Called pt to advise of update but no answer.

## 2020-01-18 NOTE — Telephone Encounter (Signed)
Pt returned call and advised of update 

## 2020-01-18 NOTE — Telephone Encounter (Signed)
Yes, ok to refill 

## 2020-01-18 NOTE — Telephone Encounter (Signed)
Patient is requesting a refill on Clopidogrel. Patient is almost out.  Pharmacy- [facility] on [address]

## 2020-01-18 NOTE — Telephone Encounter (Signed)
Please advise if you were taking over this Rx for pt.

## 2020-01-21 ENCOUNTER — Other Ambulatory Visit: Payer: Self-pay | Admitting: Internal Medicine

## 2020-01-21 DIAGNOSIS — I10 Essential (primary) hypertension: Secondary | ICD-10-CM

## 2020-01-25 ENCOUNTER — Telehealth: Payer: Self-pay

## 2020-01-25 ENCOUNTER — Ambulatory Visit: Payer: Medicare HMO | Admitting: Gastroenterology

## 2020-01-25 ENCOUNTER — Encounter: Payer: Self-pay | Admitting: Gastroenterology

## 2020-01-25 ENCOUNTER — Other Ambulatory Visit (INDEPENDENT_AMBULATORY_CARE_PROVIDER_SITE_OTHER): Payer: Medicare HMO

## 2020-01-25 VITALS — BP 130/82 | HR 76 | Ht 66.0 in | Wt 192.0 lb

## 2020-01-25 DIAGNOSIS — Z7902 Long term (current) use of antithrombotics/antiplatelets: Secondary | ICD-10-CM | POA: Diagnosis not present

## 2020-01-25 DIAGNOSIS — Z1211 Encounter for screening for malignant neoplasm of colon: Secondary | ICD-10-CM | POA: Diagnosis not present

## 2020-01-25 DIAGNOSIS — D649 Anemia, unspecified: Secondary | ICD-10-CM

## 2020-01-25 LAB — CBC WITH DIFFERENTIAL/PLATELET
Basophils Absolute: 0 10*3/uL (ref 0.0–0.1)
Basophils Relative: 0.4 % (ref 0.0–3.0)
Eosinophils Absolute: 0.4 10*3/uL (ref 0.0–0.7)
Eosinophils Relative: 6.1 % — ABNORMAL HIGH (ref 0.0–5.0)
HCT: 34.2 % — ABNORMAL LOW (ref 36.0–46.0)
Hemoglobin: 11.2 g/dL — ABNORMAL LOW (ref 12.0–15.0)
Lymphocytes Relative: 40.7 % (ref 12.0–46.0)
Lymphs Abs: 2.8 10*3/uL (ref 0.7–4.0)
MCHC: 32.6 g/dL (ref 30.0–36.0)
MCV: 83.7 fl (ref 78.0–100.0)
Monocytes Absolute: 0.8 10*3/uL (ref 0.1–1.0)
Monocytes Relative: 11.1 % (ref 3.0–12.0)
Neutro Abs: 2.9 10*3/uL (ref 1.4–7.7)
Neutrophils Relative %: 41.7 % — ABNORMAL LOW (ref 43.0–77.0)
Platelets: 411 10*3/uL — ABNORMAL HIGH (ref 150.0–400.0)
RBC: 4.09 Mil/uL (ref 3.87–5.11)
RDW: 16.8 % — ABNORMAL HIGH (ref 11.5–15.5)
WBC: 6.9 10*3/uL (ref 4.0–10.5)

## 2020-01-25 LAB — IBC + FERRITIN
Ferritin: 18.2 ng/mL (ref 10.0–291.0)
Iron: 43 ug/dL (ref 42–145)
Saturation Ratios: 10 % — ABNORMAL LOW (ref 20.0–50.0)
Transferrin: 307 mg/dL (ref 212.0–360.0)

## 2020-01-25 MED ORDER — FERROUS SULFATE 325 (65 FE) MG PO TABS: 325.0000 mg | ORAL_TABLET | Freq: Every day | ORAL | 1 refills | Status: DC

## 2020-01-25 MED ORDER — SUTAB 1479-225-188 MG PO TABS: 1.0000 | ORAL_TABLET | Freq: Once | ORAL | 0 refills | Status: AC

## 2020-01-25 NOTE — Progress Notes (Unsigned)
See result note.  Change colon to ECL on 03-12-20. Send ferrous sulfate to pharmacy.

## 2020-01-25 NOTE — Patient Instructions (Addendum)
If you are age 52 or older, your body mass index should be between 23-30. Your Body mass index is 30.99 kg/m. If this is out of the aforementioned range listed, please consider follow up with your Primary Care Provider.  If you are age 56 or younger, your body mass index should be between 19-25. Your Body mass index is 30.99 kg/m. If this is out of the aformentioned range listed, please consider follow up with your Primary Care Provider.   You have been scheduled for a colonoscopy. Please follow written instructions given to you at your visit today.  Please pick up your prep supplies at the pharmacy within the next 1-3 days. If you use inhalers (even only as needed), please bring them with you on the day of your procedure.   Please go to the lab in the basement of our building to have lab work done as you leave today. Hit "B" for basement when you get on the elevator.  When the doors open the lab is on your left.  We will call you with the results. Thank you.  Thank you for entrusting me with your care and for choosing [facility], Dr. Newsoms Cellar

## 2020-01-25 NOTE — Progress Notes (Signed)
HPI :  54 y/o female with history of multiple CVAs x 4, last in 2016, on chronic Plavix, history of DM, HTN, HLD, referred by Dr. Lelon Frohlich for colon cancer screening.   Patient states she has a history of 4 strokes in the past, the last was in 2016.  She is maintained on chronic Plavix therapy and has been doing well in recent years.  She is able to ambulate with a cane.  She has never had a prior colonoscopy.  Her uncle had colon cancer in his 53s, no other family history of colon cancer.  She states she has some baseline constipation, has been using MiraLAX as needed, not using it much anymore.  She states she takes another " pill" for this every day but cannot remember the name of it, has it at home.  She thinks it may be Linzess and it works quite well for her.  She denies any blood in her stools.  No abdominal pains.  No weight loss.  She does have a history of reflux, takes Protonix as needed, does not have to take it too often and this works really well for her.  No dysphagia.  On review of labs she has some mild anemia with hemoglobin in the 10s with normal MCV, normal B12, normal TSH.  She denies any cardiopulmonary symptoms, otherwise feels well.  We discussed options as below.  Of note she states she has been able to hold Plavix for dental work in the past, denies any problems with anesthesia historically   Echo 06/12/2015 - EF 65-70%   Past Medical History:  Diagnosis Date   ASTHMA 12/10/2009   CEREBROVASCULAR ACCIDENT, HX OF 04/19/3569   Complication of anesthesia    "just can't get me up" (06/09/2013)   Controlled type 2 diabetes mellitus without complication, without long-term current use of insulin (HCC)    Embolic stroke of right basal ganglia ([place]) 06/11/2015   with L residual weakness/hemiparesis    HYPERLIPIDEMIA 12/10/2009   HYPERTENSION 12/10/2009   Stroke ([place]) 2008   denies residual on 06/09/2013   Tendonitis of wrist, right      Past Surgical  History:  Procedure Laterality Date   COSMETIC SURGERY     OVARIAN CYST REMOVAL  60's   Family History  Problem Relation Age of Onset   Multiple myeloma Mother    Breast cancer Mother    Diabetes Mother    Diabetes Father    Breast cancer Maternal Aunt    Stomach cancer Maternal Aunt    Liver cancer Maternal Aunt    Prostate cancer Maternal Uncle    Social History   Tobacco Use   Smoking status: Never Smoker   Smokeless tobacco: Never Used  Substance Use Topics   Alcohol use: No    Alcohol/week: 0.0 standard drinks   Drug use: No   Current Outpatient Medications  Medication Sig Dispense Refill   acetaminophen (TYLENOL) 325 MG tablet Take 2 tablets (650 mg total) by mouth every 6 (six) hours as needed. 20 tablet 0   albuterol (PROAIR HFA) 108 (90 Base) MCG/ACT inhaler INHALE 2 PUFFS INTO THE LUNGS EVERY 6 HOURS AS NEEDED FOR WHEEZING. 8.5 g 1   amLODipine (NORVASC) 10 MG tablet TAKE 1 TABLET BY MOUTH ONCE DAILY(MUST HAVE OFFICE VISIT FOR REFILLS) 90 tablet 0   atorvastatin (LIPITOR) 40 MG tablet Take 1 tablet by mouth once daily 90 tablet 0   baclofen (LIORESAL) 10 MG tablet Take 1  tablet (10 mg total) by mouth 2 (two) times daily as needed for muscle spasms. 60 tablet 2   carvedilol (COREG) 3.125 MG tablet TAKE 1 TABLET BY MOUTH TWICE DAILY WITH MEALS 180 tablet 0   clopidogrel (PLAVIX) 75 MG tablet Take 1 tablet (75 mg total) by mouth daily. 30 tablet 6   diphenhydrAMINE (BENADRYL) 25 MG tablet Take 50 mg by mouth at bedtime.     losartan (COZAAR) 100 MG tablet Take 1 tablet by mouth once daily 90 tablet 0   Multiple Vitamin (MULTIVITAMIN WITH MINERALS) TABS tablet Take 1 tablet by mouth daily.     pantoprazole (PROTONIX) 40 MG tablet TAKE 1 TABLET BY MOUTH prn 90 tablet 1   polyethylene glycol (MIRALAX / GLYCOLAX) packet Take 17 g by mouth daily. 30 each 1   triamcinolone ointment (KENALOG) 0.5 % Apply 1 application topically 2 (two) times daily.  30 g 0   No current facility-administered medications for this visit.   Allergies  Allergen Reactions   Penicillins Rash     Review of Systems: All systems reviewed and negative except where noted in HPI.   Lab Results  Component Value Date   WBC 6.1 11/18/2019   HGB 10.7 (L) 11/18/2019   HCT 32.7 (L) 11/18/2019   MCV 84.3 11/18/2019   PLT 380.0 11/18/2019   Lab Results  Component Value Date   CREATININE 0.69 11/18/2019   BUN 9 11/18/2019   NA 140 11/18/2019   K 4.2 11/18/2019   CL 103 11/18/2019   CO2 28 11/18/2019    Lab Results  Component Value Date   ALT 18 11/18/2019   AST 16 11/18/2019   ALKPHOS 93 11/18/2019   BILITOT 0.3 11/18/2019     Physical Exam: BP 130/82    Pulse 76    Ht '5\' 6"'  (1.676 m)    Wt 192 lb (87.1 kg)    BMI 30.99 kg/m  Constitutional: Pleasant,well-developed, female in no acute distress. HEENT: Normocephalic and atraumatic. Conjunctivae are normal. No scleral icterus. Neck supple.  Cardiovascular: Normal rate, regular rhythm.  Pulmonary/chest: Effort normal and breath sounds normal.  Abdominal: Soft, nondistended, nontender. . There are no masses palpable. Extremities: no edema Lymphadenopathy: No cervical adenopathy noted. Neurological: Alert and oriented to person place and time. Skin: Skin is warm and dry. No rashes noted. Psychiatric: Normal mood and affect. Behavior is normal.   ASSESSMENT AND PLAN: 54 year old female here for new patient assessment the following:  Colon cancer screening / antiplatelet use - patient is overdue for colon cancer screening, at average risk for colon cancer.  She does have a mild anemia which will be evaluating as below, otherwise no alarm symptoms.  I discussed colon cancer screening options with her to include stool based testing, optical colonoscopy, virtual colonoscopy, etc.  After discussion of all options she strongly wishes to proceed with optical colonoscopy if possible.  We discussed that  in order to do this we would need to hold her Plavix for 5 days to reduce procedural risk of bleeding, she states she is been able to do this without a problem in the past, although understands she has at slightly higher risk for recurrent stroke during that time.  We discussed risk and benefits of anesthesia and colonoscopy and she wanted to proceed.  We will reach out to her prescribing provider to ensure it is okay to hold Plavix for 5 days for this exam.   Anemia - noted on lab in February, no  history of anemia previously, will repeat CBC with iron studies to ensure no iron deficiency.  She understands that if she is iron deficient, we will need to add an upper endoscopy to her colonoscopy  Jasper Cellar, MD [facility]  CC: Isaac Bliss, [place]*

## 2020-01-25 NOTE — Telephone Encounter (Signed)
Changed patient's procedure to 03-12-20. IF cleared to hold Plavix will not take starting on 03-07-20

## 2020-01-25 NOTE — Telephone Encounter (Signed)
   Rebecca Yoder [birth year] [id number]  Dear Dr. Isaac Bliss:  We have scheduled the above named patient for a(n) colonoscopy procedure. Our records show that (s)he is on anticoagulation therapy.  Please advise as to whether the patient may come off their therapy of PLAVIX 5 days prior to their procedure which is scheduled for 02-21-20.  Please route your response to Lemar Lofty, CMA or fax response to [phone]. Thank you.  Sincerely,    [facility]

## 2020-01-31 ENCOUNTER — Telehealth: Payer: Self-pay | Admitting: Internal Medicine

## 2020-01-31 NOTE — Telephone Encounter (Signed)
Tia Alert from [place] stated they faxed and sent a message to [facility] about holding the PLAVIX but have not heard a response. Jan stated she can returned her call at [phone] and ask for Tia Alert or fax at the number listed below.   Retrieved from a Phone note sent last week:  Cypress Hinkson [birth year] [id number]  Dear Dr. Isaac Bliss:  We have scheduled the above named patient for a(n) colonoscopy procedure. Our records show that (s)he is on anticoagulation therapy.  Please advise as to whether the patient may come off their therapy of PLAVIX 5 days prior to their procedure which is scheduled for 02-21-20.  Please route your response to Lemar Lofty, CMA or fax response to [phone]. Thank you.

## 2020-01-31 NOTE — Telephone Encounter (Signed)
Called Dr. Jerilee Hoh Acosto's office to follow up on request to hold Plavix prior to procedure.  She is out of the office today but will be back in the office tomorrow and will respond to request.

## 2020-02-01 NOTE — Telephone Encounter (Signed)
Called and left message for pt to call back to discuss.

## 2020-02-01 NOTE — Telephone Encounter (Signed)
Pt called back.  She understands to hold Plavix starting on 7-28.

## 2020-02-01 NOTE — Telephone Encounter (Signed)
Ok to stop Plavix 5 days prior to procedure and resume as soon as possible afterwards.  Rebecca Yoder

## 2020-02-17 ENCOUNTER — Ambulatory Visit (INDEPENDENT_AMBULATORY_CARE_PROVIDER_SITE_OTHER): Payer: Medicare HMO | Admitting: Internal Medicine

## 2020-02-17 ENCOUNTER — Encounter: Payer: Self-pay | Admitting: Internal Medicine

## 2020-02-17 ENCOUNTER — Other Ambulatory Visit: Payer: Self-pay

## 2020-02-17 VITALS — BP 130/80 | HR 78 | Temp 98.2°F | Wt 191.3 lb

## 2020-02-17 DIAGNOSIS — E119 Type 2 diabetes mellitus without complications: Secondary | ICD-10-CM

## 2020-02-17 DIAGNOSIS — G8194 Hemiplegia, unspecified affecting left nondominant side: Secondary | ICD-10-CM

## 2020-02-17 DIAGNOSIS — K219 Gastro-esophageal reflux disease without esophagitis: Secondary | ICD-10-CM

## 2020-02-17 DIAGNOSIS — I63511 Cerebral infarction due to unspecified occlusion or stenosis of right middle cerebral artery: Secondary | ICD-10-CM | POA: Diagnosis not present

## 2020-02-17 DIAGNOSIS — I10 Essential (primary) hypertension: Secondary | ICD-10-CM

## 2020-02-17 DIAGNOSIS — E7800 Pure hypercholesterolemia, unspecified: Secondary | ICD-10-CM | POA: Diagnosis not present

## 2020-02-17 DIAGNOSIS — J4530 Mild persistent asthma, uncomplicated: Secondary | ICD-10-CM | POA: Diagnosis not present

## 2020-02-17 LAB — POCT GLYCOSYLATED HEMOGLOBIN (HGB A1C): Hemoglobin A1C: 6.4 % — AB (ref 4.0–5.6)

## 2020-02-17 NOTE — Progress Notes (Signed)
Established Patient Office Visit     This visit occurred during the SARS-CoV-2 public health emergency.  Safety protocols were in place, including screening questions prior to the visit, additional usage of staff PPE, and extensive cleaning of exam room while observing appropriate contact time as indicated for disinfecting solutions.    CC/Reason for Visit: 78-monthfollow-up chronic medical conditions  HPI: Rebecca Smethersis a 54y.o. female who is coming in today for the above mentioned reasons. Past Medical History is significant for: right MCA CVA in 2016 with significant residual left spastic hemiplegia managed by Dr. KLetta Patewith Botox injections. She also has well-controlled hypertension, hyperlipidemia and diabetes.  At last visit she has been having increased asthma symptoms that has been well controlled with as needed albuterol.  No further issues today.   Past Medical/Surgical History: Past Medical History:  Diagnosis Date  . ASTHMA 12/10/2009  . CEREBROVASCULAR ACCIDENT, HX OF 12/10/2009  . Complication of anesthesia    "just can't get me up" (06/09/2013)  . Controlled type 2 diabetes mellitus without complication, without long-term current use of insulin ([place]   . Embolic stroke of right basal ganglia ([place] 06/11/2015   with L residual weakness/hemiparesis   . HYPERLIPIDEMIA 12/10/2009  . HYPERTENSION 12/10/2009  . Stroke ([facility] 2008   denies residual on 06/09/2013  . Tendonitis of wrist, right     Past Surgical History:  Procedure Laterality Date  . COSMETIC SURGERY    . OVARIAN CYST REMOVAL  1990's    Social History:  reports that she has never smoked. She has never used smokeless tobacco. She reports that she does not drink alcohol and does not use drugs.  Allergies: Allergies  Allergen Reactions  . Penicillins Rash    Family History:  Family History  Problem Relation Age of Onset  . Multiple myeloma Mother   . Breast cancer Mother   . Diabetes  Mother   . Diabetes Father   . Breast cancer Maternal Aunt   . Stomach cancer Maternal Aunt   . Liver cancer Maternal Aunt   . Prostate cancer Maternal Uncle      Current Outpatient Medications:  .  acetaminophen (TYLENOL) 325 MG tablet, Take 2 tablets (650 mg total) by mouth every 6 (six) hours as needed., Disp: 20 tablet, Rfl: 0 .  albuterol (PROAIR HFA) 108 (90 Base) MCG/ACT inhaler, INHALE 2 PUFFS INTO THE LUNGS EVERY 6 HOURS AS NEEDED FOR WHEEZING., Disp: 8.5 g, Rfl: 1 .  amLODipine (NORVASC) 10 MG tablet, TAKE 1 TABLET BY MOUTH ONCE DAILY(MUST HAVE OFFICE VISIT FOR REFILLS), Disp: 90 tablet, Rfl: 0 .  atorvastatin (LIPITOR) 40 MG tablet, Take 1 tablet by mouth once daily, Disp: 90 tablet, Rfl: 0 .  baclofen (LIORESAL) 10 MG tablet, Take 1 tablet (10 mg total) by mouth 2 (two) times daily as needed for muscle spasms., Disp: 60 tablet, Rfl: 2 .  carvedilol (COREG) 3.125 MG tablet, TAKE 1 TABLET BY MOUTH TWICE DAILY WITH MEALS, Disp: 180 tablet, Rfl: 0 .  clopidogrel (PLAVIX) 75 MG tablet, Take 1 tablet (75 mg total) by mouth daily., Disp: 30 tablet, Rfl: 6 .  diphenhydrAMINE (BENADRYL) 25 MG tablet, Take 50 mg by mouth at bedtime., Disp: , Rfl:  .  ferrous sulfate 325 (65 FE) MG tablet, Take 1 tablet (325 mg total) by mouth daily with breakfast., Disp: 90 tablet, Rfl: 1 .  losartan (COZAAR) 100 MG tablet, Take 1 tablet by mouth once daily,  Disp: 90 tablet, Rfl: 0 .  Multiple Vitamin (MULTIVITAMIN WITH MINERALS) TABS tablet, Take 1 tablet by mouth daily., Disp: , Rfl:  .  pantoprazole (PROTONIX) 40 MG tablet, TAKE 1 TABLET BY MOUTH prn, Disp: 90 tablet, Rfl: 1 .  polyethylene glycol (MIRALAX / GLYCOLAX) packet, Take 17 g by mouth daily., Disp: 30 each, Rfl: 1 .  triamcinolone ointment (KENALOG) 0.5 %, Apply 1 application topically 2 (two) times daily., Disp: 30 g, Rfl: 0  Review of Systems:  Constitutional: Denies fever, chills, diaphoresis, appetite change and fatigue.  HEENT: Denies  photophobia, eye pain, redness, hearing loss, ear pain, congestion, sore throat, rhinorrhea, sneezing, mouth sores, trouble swallowing, neck pain, neck stiffness and tinnitus.   Respiratory: Denies SOB, DOE, cough, chest tightness,  and wheezing.   Cardiovascular: Denies chest pain, palpitations and leg swelling.  Gastrointestinal: Denies nausea, vomiting, abdominal pain, diarrhea, constipation, blood in stool and abdominal distention.  Genitourinary: Denies dysuria, urgency, frequency, hematuria, flank pain and difficulty urinating.  Endocrine: Denies: hot or cold intolerance, sweats, changes in hair or nails, polyuria, polydipsia. Musculoskeletal: Denies myalgias, back pain, joint swelling, arthralgias and gait problem.  Skin: Denies pallor, rash and wound.  Neurological: Denies dizziness, seizures, syncope, weakness, light-headedness, numbness and headaches.  Hematological: Denies adenopathy. Easy bruising, personal or family bleeding history  Psychiatric/Behavioral: Denies suicidal ideation, mood changes, confusion, nervousness, sleep disturbance and agitation    Physical Exam: Vitals:   02/17/20 1349  BP: 130/80  Pulse: 78  Temp: 98.2 F (36.8 C)  TempSrc: Temporal  SpO2: 99%  Weight: 191 lb 4.8 oz (86.8 kg)    Body mass index is 30.88 kg/m.   Constitutional: NAD, calm, comfortable Eyes: PERRL, lids and conjunctivae normal, wears corrective lenses ENMT: Mucous membranes are moist.  Respiratory: clear to auscultation bilaterally, no wheezing, no crackles. Normal respiratory effort. No accessory muscle use.  Cardiovascular: Regular rate and rhythm, no murmurs / rubs / gallops. No extremity edema.  Abdomen: no tenderness, no masses palpated. No hepatosplenomegaly. Bowel sounds positive.   Psychiatric: Normal judgment and insight. Alert and oriented x 3. Normal mood.    Impression and Plan:  Controlled type 2 diabetes mellitus without complication, without long-term current  use of insulin ([place])  -Well-controlled with an A1c of 6.1 in office today.  Pure hypercholesterolemia -Last LDL was 46 in April.  Continue statin.  Essential hypertension -Well-controlled on current regimen.  Right middle cerebral artery stroke (HCC) Left hemiparesis ([place]) -Noted.  Gastroesophageal reflux disease, unspecified whether esophagitis present -Well-controlled on PPI therapy  Mild persistent asthma without complication -Well-controlled on as needed albuterol.    Patient Instructions  -Nice seeing you today!!  -Schedule follow up visit in 3 months.     Lelon Frohlich, MD [facility] at [facility]

## 2020-02-17 NOTE — Patient Instructions (Signed)
-  Nice seeing you today!!  -Schedule follow up visit in 3 months.

## 2020-02-18 ENCOUNTER — Other Ambulatory Visit: Payer: Self-pay | Admitting: Internal Medicine

## 2020-02-18 DIAGNOSIS — E119 Type 2 diabetes mellitus without complications: Secondary | ICD-10-CM

## 2020-02-18 DIAGNOSIS — J4530 Mild persistent asthma, uncomplicated: Secondary | ICD-10-CM

## 2020-02-20 ENCOUNTER — Telehealth: Payer: Self-pay

## 2020-02-20 DIAGNOSIS — D649 Anemia, unspecified: Secondary | ICD-10-CM

## 2020-02-20 NOTE — Telephone Encounter (Signed)
-----   Message from Roetta Sessions, [place] sent at 01/25/2020  6:08 PM EDT ----- Regarding: recheck CBC Recheck CBC - anemia

## 2020-02-20 NOTE — Telephone Encounter (Signed)
Sent MyChart message to pt that she is due for labs

## 2020-02-21 ENCOUNTER — Encounter: Payer: Medicare HMO | Admitting: Gastroenterology

## 2020-02-22 ENCOUNTER — Telehealth: Payer: Self-pay | Admitting: Gastroenterology

## 2020-02-22 ENCOUNTER — Other Ambulatory Visit (INDEPENDENT_AMBULATORY_CARE_PROVIDER_SITE_OTHER): Payer: Medicare HMO

## 2020-02-22 DIAGNOSIS — D649 Anemia, unspecified: Secondary | ICD-10-CM | POA: Diagnosis not present

## 2020-02-22 LAB — CBC WITH DIFFERENTIAL/PLATELET
Basophils Absolute: 0 K/uL (ref 0.0–0.1)
Basophils Relative: 0.7 % (ref 0.0–3.0)
Eosinophils Absolute: 0.4 K/uL (ref 0.0–0.7)
Eosinophils Relative: 6.4 % — ABNORMAL HIGH (ref 0.0–5.0)
HCT: 35.1 % — ABNORMAL LOW (ref 36.0–46.0)
Hemoglobin: 11.5 g/dL — ABNORMAL LOW (ref 12.0–15.0)
Lymphocytes Relative: 48.5 % — ABNORMAL HIGH (ref 12.0–46.0)
Lymphs Abs: 2.8 K/uL (ref 0.7–4.0)
MCHC: 32.7 g/dL (ref 30.0–36.0)
MCV: 83.8 fl (ref 78.0–100.0)
Monocytes Absolute: 0.6 K/uL (ref 0.1–1.0)
Monocytes Relative: 10.4 % (ref 3.0–12.0)
Neutro Abs: 2 K/uL (ref 1.4–7.7)
Neutrophils Relative %: 34 % — ABNORMAL LOW (ref 43.0–77.0)
Platelets: 391 K/uL (ref 150.0–400.0)
RBC: 4.19 Mil/uL (ref 3.87–5.11)
RDW: 17 % — ABNORMAL HIGH (ref 11.5–15.5)
WBC: 5.8 K/uL (ref 4.0–10.5)

## 2020-02-22 NOTE — Telephone Encounter (Signed)
Spoke with patient regarding lab results from earlier today, patient was unable to read message through My Chart. Read result note/message from Dr. Havery Moros, pt advised to continue iron supplement and to keep schedule endo colon on 03/12/20. Pt advised to call office back if she had any concerns prior to then.

## 2020-02-22 NOTE — Telephone Encounter (Signed)
Pt called inquiring about labs results. She stated that she cannot access my chart. Pls call her.

## 2020-02-23 ENCOUNTER — Encounter: Payer: Self-pay | Admitting: Physical Medicine & Rehabilitation

## 2020-02-23 ENCOUNTER — Encounter: Payer: Medicare HMO | Attending: Physical Medicine & Rehabilitation | Admitting: Physical Medicine & Rehabilitation

## 2020-02-23 ENCOUNTER — Other Ambulatory Visit: Payer: Self-pay

## 2020-02-23 VITALS — BP 162/90 | HR 74 | Temp 98.1°F | Ht 66.0 in | Wt 190.4 lb

## 2020-02-23 DIAGNOSIS — M25512 Pain in left shoulder: Secondary | ICD-10-CM | POA: Insufficient documentation

## 2020-02-23 DIAGNOSIS — Z8673 Personal history of transient ischemic attack (TIA), and cerebral infarction without residual deficits: Secondary | ICD-10-CM | POA: Diagnosis present

## 2020-02-23 DIAGNOSIS — S43002A Unspecified subluxation of left shoulder joint, initial encounter: Secondary | ICD-10-CM | POA: Diagnosis not present

## 2020-02-23 DIAGNOSIS — M7542 Impingement syndrome of left shoulder: Secondary | ICD-10-CM | POA: Insufficient documentation

## 2020-02-23 DIAGNOSIS — G8110 Spastic hemiplegia affecting unspecified side: Secondary | ICD-10-CM | POA: Diagnosis not present

## 2020-02-23 DIAGNOSIS — G8194 Hemiplegia, unspecified affecting left nondominant side: Secondary | ICD-10-CM | POA: Diagnosis present

## 2020-02-23 DIAGNOSIS — I69354 Hemiplegia and hemiparesis following cerebral infarction affecting left non-dominant side: Secondary | ICD-10-CM | POA: Insufficient documentation

## 2020-02-23 DIAGNOSIS — M7502 Adhesive capsulitis of left shoulder: Secondary | ICD-10-CM | POA: Insufficient documentation

## 2020-02-23 DIAGNOSIS — Z79899 Other long term (current) drug therapy: Secondary | ICD-10-CM | POA: Insufficient documentation

## 2020-02-23 DIAGNOSIS — R269 Unspecified abnormalities of gait and mobility: Secondary | ICD-10-CM | POA: Insufficient documentation

## 2020-02-23 DIAGNOSIS — I10 Essential (primary) hypertension: Secondary | ICD-10-CM | POA: Insufficient documentation

## 2020-02-23 DIAGNOSIS — E785 Hyperlipidemia, unspecified: Secondary | ICD-10-CM | POA: Insufficient documentation

## 2020-02-23 NOTE — Patient Instructions (Addendum)
Rebecca Yoder PT is running Stroke Support group  Practice putting Left hand behind the head

## 2020-02-23 NOTE — Progress Notes (Signed)
Subjective:    Patient ID: Rebecca Yoder, female    DOB: [birth year], 54 y.o.   MRN: [id number]  HPI 54 yo female with Right subcortical infarcts in 2008, 2014 and 2016.   01/12/2020- Botox inj Posterior tibialis 75 Gastroc medial 75  FHL 50 FDL75 EHL 25  Walked in the beach at [facility]  Can don sock  Without hyperactive Babinski Left lower extremity tone improved after botulinum toxin injection Continues on a home exercise program Pain Inventory Average Pain 0 Pain Right Now 0 My pain is sharp  In the last 24 hours, has pain interfered with the following? General activity 8 Relation with others 9 Enjoyment of life 10 What TIME of day is your pain at its worst? night Sleep (in general) Fair  Pain is worse with: When I strech my left leg out.  Pain improves with: rest and medication Relief from Meds: 0  Mobility use a cane how many minutes can you walk? 5 mins ability to climb steps?  no do you drive?  no use a wheelchair Do you have any goals in this area?  yes  Function disabled: date disabled 10/31/(2016 I need assistance with the following:  Family helps at home. Do you have any goals in this area?  yes  Neuro/Psych weakness trouble walking  Prior Studies Any changes since last visit?  no  Physicians involved in your care Any changes since last visit?  no   Family History  Problem Relation Age of Onset  . Multiple myeloma Mother   . Breast cancer Mother   . Diabetes Mother   . Diabetes Father   . Breast cancer Maternal Aunt   . Stomach cancer Maternal Aunt   . Liver cancer Maternal Aunt   . Prostate cancer Maternal Uncle    Social History   Socioeconomic History  . Marital status: Single    Spouse name: Not on file  . Number of children: 0  . Years of education: 12th  . Highest education level: Not on file  Occupational History  . Occupation: budd    Fish farm manager: [employer]  Tobacco Use  . Smoking status: Never Smoker    . Smokeless tobacco: Never Used  Substance and Sexual Activity  . Alcohol use: No    Alcohol/week: 0.0 standard drinks  . Drug use: No  . Sexual activity: Yes  Other Topics Concern  . Not on file  Social History Narrative  . Not on file   Social Determinants of Health   Financial Resource Strain:   . Difficulty of Paying Living Expenses:   Food Insecurity:   . Worried About Charity fundraiser in the Last Year:   . Arboriculturist in the Last Year:   Transportation Needs:   . Film/video editor (Medical):   Marland Kitchen Lack of Transportation (Non-Medical):   Physical Activity:   . Days of Exercise per Week:   . Minutes of Exercise per Session:   Stress:   . Feeling of Stress :   Social Connections:   . Frequency of Communication with Friends and Family:   . Frequency of Social Gatherings with Friends and Family:   . Attends Religious Services:   . Active Member of Clubs or Organizations:   . Attends Archivist Meetings:   Marland Kitchen Marital Status:    Past Surgical History:  Procedure Laterality Date  . COSMETIC SURGERY    . OVARIAN CYST REMOVAL  1990's  Past Medical History:  Diagnosis Date  . ASTHMA 12/10/2009  . CEREBROVASCULAR ACCIDENT, HX OF 12/10/2009  . Complication of anesthesia    "just can't get me up" (06/09/2013)  . Controlled type 2 diabetes mellitus without complication, without long-term current use of insulin ([place])   . Embolic stroke of right basal ganglia ([place]) 06/11/2015   with L residual weakness/hemiparesis   . HYPERLIPIDEMIA 12/10/2009  . HYPERTENSION 12/10/2009  . Stroke [facility]) 2008   denies residual on 06/09/2013  . Tendonitis of wrist, right    BP (!) 162/90   Pulse 74   Temp 98.1 F (36.7 C)   Ht '5\' 6"'  (1.676 m)   Wt 190 lb 6.4 oz (86.4 kg)   SpO2 97%   BMI 30.73 kg/m   Opioid Risk Score:   Fall Risk Score:  `1  Depression screen PHQ 2/9  Depression screen [facility] 2/9 04/19/2019 02/22/2019 10/27/2018 10/20/2018 04/13/2018 01/26/2018 12/23/2017   Decreased Interest 0 '3 3 3 3 3 1  ' Down, Depressed, Hopeless 0 0 0 0 0 1 0  PHQ - 2 Score 0 '3 3 3 3 4 1  ' Altered sleeping 0 '3 3 3 3 ' - 2  Tired, decreased energy 0 0 '2 2 2 ' - 3  Change in appetite 0 0 0 0 0 - 3  Feeling bad or failure about yourself  0 0 0 1 0 - 0  Trouble concentrating 0 0 2 2 0 - 2  Moving slowly or fidgety/restless 0 0 0 0 0 - 2  Suicidal thoughts 0 0 0 0 0 - 0  PHQ-9 Score 0 '6 10 11 8 ' - 13  Difficult doing work/chores Not difficult at all - - - - - -  Some recent data might be hidden   Review of Systems  Constitutional: Negative.   HENT: Negative.   Eyes: Negative.   Respiratory: Negative.   Cardiovascular: Negative.   Gastrointestinal: Negative.   Endocrine: Negative.   Genitourinary: Negative.   Musculoskeletal: Positive for gait problem.  Skin: Negative.   Allergic/Immunologic: Negative.   Neurological: Positive for weakness.       Tingling in left hand fingers   Hematological: Negative.   Psychiatric/Behavioral: Negative.        Objective:   Physical Exam  Left upper extremity 3 - at the deltoid bicep tricep for external rotators are 2 - on the left side Tone in the left upper limb is MAS two at the elbow flexors one at the finger and wrist flexors Left lower extremity 3 - at the hip flexor and 4 - at the knee extensor and to minus at the ankle dorsiflexor.  Toe flexors are trace With standing there is mild clawing of the toes of the left foot including the great toe Ambulates with a cane some knee hyperextension on the left side uses AFO    Assessment & Plan:  #1.  Left spastic hemiplegia for right subcortical infarcts x3 .  Functioning at a modified independent level still needs some assistance in the community. Good results with current botulinum toxin regimen we will repeat in 6 weeks discussed with patient agrees with plan

## 2020-03-01 ENCOUNTER — Other Ambulatory Visit: Payer: Self-pay | Admitting: *Deleted

## 2020-03-01 DIAGNOSIS — I10 Essential (primary) hypertension: Secondary | ICD-10-CM

## 2020-03-01 MED ORDER — CARVEDILOL 3.125 MG PO TABS: 3.1250 mg | ORAL_TABLET | Freq: Two times a day (BID) | ORAL | 1 refills | Status: DC

## 2020-03-05 ENCOUNTER — Encounter: Payer: Self-pay | Admitting: Gastroenterology

## 2020-03-07 ENCOUNTER — Other Ambulatory Visit: Payer: Self-pay | Admitting: Internal Medicine

## 2020-03-07 DIAGNOSIS — J4530 Mild persistent asthma, uncomplicated: Secondary | ICD-10-CM

## 2020-03-12 ENCOUNTER — Encounter: Payer: Self-pay | Admitting: Gastroenterology

## 2020-03-12 ENCOUNTER — Ambulatory Visit (AMBULATORY_SURGERY_CENTER): Payer: Medicare HMO | Admitting: Gastroenterology

## 2020-03-12 ENCOUNTER — Other Ambulatory Visit: Payer: Self-pay

## 2020-03-12 VITALS — BP 158/70 | HR 78 | Temp 97.7°F | Resp 17 | Ht 66.0 in | Wt 190.0 lb

## 2020-03-12 DIAGNOSIS — D123 Benign neoplasm of transverse colon: Secondary | ICD-10-CM

## 2020-03-12 DIAGNOSIS — D124 Benign neoplasm of descending colon: Secondary | ICD-10-CM | POA: Diagnosis not present

## 2020-03-12 DIAGNOSIS — K449 Diaphragmatic hernia without obstruction or gangrene: Secondary | ICD-10-CM | POA: Diagnosis not present

## 2020-03-12 DIAGNOSIS — K3189 Other diseases of stomach and duodenum: Secondary | ICD-10-CM

## 2020-03-12 DIAGNOSIS — D509 Iron deficiency anemia, unspecified: Secondary | ICD-10-CM

## 2020-03-12 DIAGNOSIS — Z1211 Encounter for screening for malignant neoplasm of colon: Secondary | ICD-10-CM | POA: Diagnosis not present

## 2020-03-12 DIAGNOSIS — D649 Anemia, unspecified: Secondary | ICD-10-CM | POA: Diagnosis not present

## 2020-03-12 MED ORDER — SODIUM CHLORIDE 0.9 % IV SOLN: 500.0000 mL | Freq: Once | INTRAVENOUS | Status: DC

## 2020-03-12 NOTE — Progress Notes (Signed)
Called to room to assist during endoscopic procedure.  Patient ID and intended procedure confirmed with present staff. Received instructions for my participation in the procedure from the performing physician.  

## 2020-03-12 NOTE — Patient Instructions (Signed)
Please read handouts provided. Continue present medications. Await pathology results. Resume Plavix tomorrow.      YOU HAD AN ENDOSCOPIC PROCEDURE TODAY AT [facility]:   Refer to the procedure report that was given to you for any specific questions about what was found during the examination.  If the procedure report does not answer your questions, please call your gastroenterologist to clarify.  If you requested that your care partner not be given the details of your procedure findings, then the procedure report has been included in a sealed envelope for you to review at your convenience later.  YOU SHOULD EXPECT: Some feelings of bloating in the abdomen. Passage of more gas than usual.  Walking can help get rid of the air that was put into your GI tract during the procedure and reduce the bloating. If you had a lower endoscopy (such as a colonoscopy or flexible sigmoidoscopy) you may notice spotting of blood in your stool or on the toilet paper. If you underwent a bowel prep for your procedure, you may not have a normal bowel movement for a few days.  Please Note:  You might notice some irritation and congestion in your nose or some drainage.  This is from the oxygen used during your procedure.  There is no need for concern and it should clear up in a day or so.  SYMPTOMS TO REPORT IMMEDIATELY:   Following lower endoscopy (colonoscopy or flexible sigmoidoscopy):  Excessive amounts of blood in the stool  Significant tenderness or worsening of abdominal pains  Swelling of the abdomen that is new, acute  Fever of 100F or higher   Following upper endoscopy (EGD)  Vomiting of blood or coffee ground material  New chest pain or pain under the shoulder blades  Painful or persistently difficult swallowing  New shortness of breath  Fever of 100F or higher  Black, tarry-looking stools  For urgent or emergent issues, a gastroenterologist can be reached at any hour by  calling [phone]. Do not use MyChart messaging for urgent concerns.    DIET:  We do recommend a small meal at first, but then you may proceed to your regular diet.  Drink plenty of fluids but you should avoid alcoholic beverages for 24 hours.  ACTIVITY:  You should plan to take it easy for the rest of today and you should NOT DRIVE or use heavy machinery until tomorrow (because of the sedation medicines used during the test).    FOLLOW UP: Our staff will call the number listed on your records 48-72 hours following your procedure to check on you and address any questions or concerns that you may have regarding the information given to you following your procedure. If we do not reach you, we will leave a message.  We will attempt to reach you two times.  During this call, we will ask if you have developed any symptoms of COVID 19. If you develop any symptoms (ie: fever, flu-like symptoms, shortness of breath, cough etc.) before then, please call [phone].  If you test positive for Covid 19 in the 2 weeks post procedure, please call and report this information to [place].    If any biopsies were taken you will be contacted by phone or by letter within the next 1-3 weeks.  Please call us at [phone] if you have not heard about the biopsies in 3 weeks.    SIGNATURES/CONFIDENTIALITY: You and/or your care partner have signed paperwork which will be entered  into your electronic medical record.  These signatures attest to the fact that that the information above on your After Visit Summary has been reviewed and is understood.  Full responsibility of the confidentiality of this discharge information lies with you and/or your care-partner.

## 2020-03-12 NOTE — Op Note (Addendum)
[place] Patient Name: Rebecca Yoder Procedure Date: 03/12/2020 2:07 PM MRN: [id number] Endoscopist: Remo Lipps P. Havery Moros , MD Age: 54 Referring MD:  Date of Birth: [birth year] Gender: Female Account #: [id number] Procedure:                Colonoscopy Indications:              This is the patient's first colonoscopy, Iron                            deficiency anemia Medicines:                Monitored Anesthesia Care Procedure:                Pre-Anesthesia Assessment:                           - Prior to the procedure, a History and Physical                            was performed, and patient medications and                            allergies were reviewed. The patient's tolerance of                            previous anesthesia was also reviewed. The risks                            and benefits of the procedure and the sedation                            options and risks were discussed with the patient.                            All questions were answered, and informed consent                            was obtained. Prior Anticoagulants: The patient has                            taken Plavix (clopidogrel), last dose was 5 days                            prior to procedure. ASA Grade Assessment: III - A                            patient with severe systemic disease. After                            reviewing the risks and benefits, the patient was                            deemed in satisfactory condition to undergo the  procedure.                           After obtaining informed consent, the colonoscope                            was passed under direct vision. Throughout the                            procedure, the patient's blood pressure, pulse, and                            oxygen saturations were monitored continuously. The                            Colonoscope was introduced through the anus and                             advanced to the the terminal ileum, with                            identification of the appendiceal orifice and IC                            valve. The colonoscopy was performed without                            difficulty. The patient tolerated the procedure                            well. The quality of the bowel preparation was                            good. The terminal ileum, ileocecal valve,                            appendiceal orifice, and rectum were photographed. Scope In: 2:24:58 PM Scope Out: 2:46:23 PM Scope Withdrawal Time: 0 hours 18 minutes 12 seconds  Total Procedure Duration: 0 hours 21 minutes 25 seconds  Findings:                 The perianal and digital rectal examinations were                            normal.                           The terminal ileum appeared normal.                           A 4 mm polyp was found in the transverse colon. The                            polyp was flat. The polyp was removed with a cold  snare. Resection and retrieval were complete.                           A linear area of striped erythematous and slightly                            nodular mucosa was found in the descending colon.                            Biopsies were taken with a cold forceps for                            histology.                           The exam was otherwise without abnormality. Complications:            No immediate complications. Estimated blood loss:                            Minimal. Estimated Blood Loss:     Estimated blood loss was minimal. Impression:               - The examined portion of the ileum was normal.                           - One 4 mm polyp in the transverse colon, removed                            with a cold snare. Resected and retrieved.                           - Linear area of erythematous mucosa in the                            descending colon, suspect normal variant, unclear                             if patient has a history of ischemic colitis.                            Biopsied.                           - The examination was otherwise normal. Recommendation:           - Patient has a contact number available for                            emergencies. The signs and symptoms of potential                            delayed complications were discussed with the                            patient. Return  to normal activities tomorrow.                            Written discharge instructions were provided to the                            patient.                           - Resume previous diet.                           - Continue present medications.                           - Resume Plavix tomorrow due to polypectomy today                           - Await pathology results. Remo Lipps P. Havery Moros, MD 03/12/2020 2:53:23 PM This report has been signed electronically.

## 2020-03-12 NOTE — Progress Notes (Signed)
Pt's states no medical or surgical changes since previsit or office visit.  VS WR 

## 2020-03-12 NOTE — Progress Notes (Signed)
A/ox3, pleased with MAC, report to RN 

## 2020-03-12 NOTE — Op Note (Signed)
Rebecca Yoder: Rebecca Yoder Procedure Date: 03/12/2020 2:08 PM MRN: [id number] Endoscopist: Remo Lipps P. Havery Moros , MD Age: 54 Referring MD:  Date of Birth: [birth year] Gender: Female Account #: [id number] Procedure:                Upper GI endoscopy Indications:              Iron deficiency anemia Medicines:                Monitored Anesthesia Care Procedure:                Pre-Anesthesia Assessment:                           - Prior to the procedure, a History and Physical                            was performed, and patient medications and                            allergies were reviewed. The patient's tolerance of                            previous anesthesia was also reviewed. The risks                            and benefits of the procedure and the sedation                            options and risks were discussed with the patient.                            All questions were answered, and informed consent                            was obtained. Prior Anticoagulants: The patient has                            taken Plavix (clopidogrel), last dose was 5 days                            prior to procedure. ASA Grade Assessment: III - A                            patient with severe systemic disease. After                            reviewing the risks and benefits, the patient was                            deemed in satisfactory condition to undergo the                            procedure.  After obtaining informed consent, the endoscope was                            passed under direct vision. Throughout the                            procedure, the patient's blood pressure, pulse, and                            oxygen saturations were monitored continuously. The                            Endoscope was introduced through the mouth, and                            advanced to the second part of duodenum. The upper                             GI endoscopy was accomplished without difficulty.                            The patient tolerated the procedure well. Scope In: Scope Out: Findings:                 Esophagogastric landmarks were identified: the                            Z-line was found at 34 cm, the gastroesophageal                            junction was found at 34 cm and the upper extent of                            the gastric folds was found at 36 cm from the                            incisors.                           A 2 cm hiatal hernia was present.                           The exam of the esophagus was otherwise normal.                           Patchy mildly erythematous mucosa was found in the                            gastric antrum without erosions or ulceration.                           The exam of the stomach was otherwise normal.  Biopsies were taken with a cold forceps in the                            gastric body, at the incisura and in the gastric                            antrum for Helicobacter pylori testing.                           The duodenal bulb and second portion of the                            duodenum were normal. Complications:            No immediate complications. Estimated blood loss:                            Minimal. Estimated Blood Loss:     Estimated blood loss was minimal. Impression:               - Esophagogastric landmarks identified.                           - 2 cm hiatal hernia.                           - Normal esophagus otherwise                           - Erythematous mucosa in the antrum.                           - Normal stomach otherwise, biopsies taken to rule                            out H pylori                           - Normal duodenal bulb and second portion of the                            duodenum.                           No overt cause for iron deficiency on EGD, will                             await pathology results. Recommendation:           - Patient has a contact number available for                            emergencies. The signs and symptoms of potential                            delayed complications were discussed with the  patient. Return to normal activities tomorrow.                            Written discharge instructions were provided to the                            patient.                           - Resume previous diet.                           - Continue present medications.                           - Resume Plavix tomorrow per colonoscopy report                            (polypectomy)                           - Await pathology results. Remo Lipps P. Yigit Norkus, MD 03/12/2020 2:57:11 PM This report has been signed electronically.

## 2020-03-14 ENCOUNTER — Telehealth: Payer: Self-pay

## 2020-03-14 NOTE — Telephone Encounter (Signed)
  Follow up Call-  Call back number 03/12/2020  Post procedure Call Back phone  # [phone]  Permission to leave phone message Yes  Some recent data might be hidden     Patient questions:  Do you have a fever, pain , or abdominal swelling? No. Pain Score  0 *  Have you tolerated food without any problems? Yes.    Have you been able to return to your normal activities? Yes.    Do you have any questions about your discharge instructions: Diet   No. Medications  No. Follow up visit  No.  Do you have questions or concerns about your Care? No.  Actions: * If pain score is 4 or above: No action needed, pain <4.   1. Have you developed a fever since your procedure? No   2.   Have you had an respiratory symptoms (SOB or cough) since your procedure? No   3.   Have you tested positive for COVID 19 since your procedure? No   4.   Have you had any family members/close contacts diagnosed with the COVID 19 since your procedure?  No    If yes to any of these questions please route to Joylene John, RN and Erenest Rasher, RN

## 2020-03-22 ENCOUNTER — Telehealth: Payer: Self-pay

## 2020-03-22 NOTE — Telephone Encounter (Signed)
   Rebecca Yoder 15-Nov-1965 [id number]  Dear Dr. Isaac Bliss:  We have scheduled the above named patient, Rebecca Yoder, for a second colonoscopy procedure in October to follow up on findings from the procedure she had on 02-21-20.  Please advise as to whether Ms. Chrismer may come off her therapy of Plavix 5 days prior to her procedure which is scheduled for 05-24-20.  Please route your response to Lemar Lofty, CMA or fax response to [phone] at your earliest convenience. Thank you.  Sincerely,    [facility]

## 2020-03-22 NOTE — Telephone Encounter (Signed)
Yes, may come off plavix 5 days prior and go back as soon as possible after procedure.  [place]

## 2020-03-22 NOTE — Telephone Encounter (Signed)
-----   Message from Yetta Flock, MD sent at 03/21/2020  3:23 PM EDT ----- Called the patient and explained results. EGD and colonoscopy done, no clear cause for low iron levels. Hgb is improving on iron. Biopsies of stomach negative for HP. One benign sessile serrated polyp removed from the colon. There was some mild erythema in the left colon with slight nodular change, seemed possibly post inflammatory / normal variant but path shows some adenomatous change. I called Dr. Tresa Moore of path who reviewed this and again concern for adenoma. I did not see any clear borders of an obvious polyp so this is surprising. Unfortunately given this result I am recommending a repeat colonoscopy in 2 months or so to reassess the site and see if there is anything obvious to explain this. Patient would need to hold plavix again prior to the exam. She verbalized understanding and was agreeable to do this again in 2 months or so.   Jan can you please place a recall for colonoscopy in 2 months or maybe we can call her in a few weeks once October is released to schedule. Thanks

## 2020-03-22 NOTE — Telephone Encounter (Signed)
Patient is aware to hold her Plavix 5 days prior to procedure starting on 05-19-20.

## 2020-04-05 ENCOUNTER — Encounter: Payer: Medicare HMO | Attending: Physical Medicine & Rehabilitation | Admitting: Physical Medicine & Rehabilitation

## 2020-04-05 ENCOUNTER — Encounter: Payer: Self-pay | Admitting: Physical Medicine & Rehabilitation

## 2020-04-05 ENCOUNTER — Other Ambulatory Visit: Payer: Self-pay

## 2020-04-05 VITALS — BP 139/85 | HR 92 | Temp 98.5°F | Ht 66.0 in | Wt 190.4 lb

## 2020-04-05 DIAGNOSIS — S43002A Unspecified subluxation of left shoulder joint, initial encounter: Secondary | ICD-10-CM | POA: Diagnosis not present

## 2020-04-05 DIAGNOSIS — M7542 Impingement syndrome of left shoulder: Secondary | ICD-10-CM | POA: Diagnosis not present

## 2020-04-05 DIAGNOSIS — M7502 Adhesive capsulitis of left shoulder: Secondary | ICD-10-CM | POA: Insufficient documentation

## 2020-04-05 DIAGNOSIS — E785 Hyperlipidemia, unspecified: Secondary | ICD-10-CM | POA: Diagnosis not present

## 2020-04-05 DIAGNOSIS — Z8673 Personal history of transient ischemic attack (TIA), and cerebral infarction without residual deficits: Secondary | ICD-10-CM | POA: Diagnosis present

## 2020-04-05 DIAGNOSIS — Z79899 Other long term (current) drug therapy: Secondary | ICD-10-CM | POA: Diagnosis not present

## 2020-04-05 DIAGNOSIS — G8110 Spastic hemiplegia affecting unspecified side: Secondary | ICD-10-CM

## 2020-04-05 DIAGNOSIS — R269 Unspecified abnormalities of gait and mobility: Secondary | ICD-10-CM | POA: Insufficient documentation

## 2020-04-05 DIAGNOSIS — G8194 Hemiplegia, unspecified affecting left nondominant side: Secondary | ICD-10-CM | POA: Diagnosis present

## 2020-04-05 DIAGNOSIS — I10 Essential (primary) hypertension: Secondary | ICD-10-CM | POA: Insufficient documentation

## 2020-04-05 DIAGNOSIS — M25512 Pain in left shoulder: Secondary | ICD-10-CM | POA: Diagnosis not present

## 2020-04-05 DIAGNOSIS — I69354 Hemiplegia and hemiparesis following cerebral infarction affecting left non-dominant side: Secondary | ICD-10-CM | POA: Insufficient documentation

## 2020-04-05 NOTE — Progress Notes (Signed)
Botox Injection for spasticity using needle EMG guidance  Dilution: 50 Units/ml Indication: Severe spasticity which interferes with ADL,mobility and/or  hygiene and is unresponsive to medication management and other conservative care Informed consent was obtained after describing risks and benefits of the procedure with the patient. This includes bleeding, bruising, infection, excessive weakness, or medication side effects. A REMS form is on file and signed. Needle: 50mm 25g  needle electrode Number of units per muscle    Posterior tibialis 75 Gastroc medial 75  FHL 50 FDL 75 EHL 25 All injections were done after obtaining appropriate EMG activity and after negative drawback for blood. The patient tolerated the procedure well. Post procedure instructions were given. A followup appointment was made.  

## 2020-04-05 NOTE — Patient Instructions (Signed)
You received a Botox injection today. You may experience soreness at the needle injection sites. Please call us if any of the injection sites turns red after a couple days or if there is any drainage. You may experience muscle weakness as a result of Botox. This would improve with time but can take several weeks to improve. The Botox should start working in about one week. The Botox usually last 3 months. The injection can be repeated every 3 months as needed.   Posterior tibialis 75 Gastroc medial 75  FHL 50 FDL 75 EHL 25

## 2020-04-22 ENCOUNTER — Other Ambulatory Visit: Payer: Self-pay | Admitting: Internal Medicine

## 2020-04-22 DIAGNOSIS — I10 Essential (primary) hypertension: Secondary | ICD-10-CM

## 2020-04-30 ENCOUNTER — Other Ambulatory Visit: Payer: Self-pay | Admitting: Internal Medicine

## 2020-04-30 DIAGNOSIS — G8194 Hemiplegia, unspecified affecting left nondominant side: Secondary | ICD-10-CM

## 2020-04-30 DIAGNOSIS — I10 Essential (primary) hypertension: Secondary | ICD-10-CM

## 2020-05-09 ENCOUNTER — Other Ambulatory Visit: Payer: Self-pay

## 2020-05-09 ENCOUNTER — Ambulatory Visit (INDEPENDENT_AMBULATORY_CARE_PROVIDER_SITE_OTHER): Payer: Medicare HMO | Admitting: Family Medicine

## 2020-05-09 ENCOUNTER — Ambulatory Visit (INDEPENDENT_AMBULATORY_CARE_PROVIDER_SITE_OTHER): Payer: Medicare HMO

## 2020-05-09 ENCOUNTER — Encounter: Payer: Self-pay | Admitting: Family Medicine

## 2020-05-09 VITALS — BP 122/78 | HR 84 | Temp 97.8°F | Ht 66.0 in | Wt 187.5 lb

## 2020-05-09 DIAGNOSIS — M25512 Pain in left shoulder: Secondary | ICD-10-CM | POA: Diagnosis not present

## 2020-05-09 NOTE — Progress Notes (Signed)
 Established Patient Office Visit  Subjective:  Patient ID: Rebecca Yoder, female    DOB: 01/18/1966  Age: 54 y.o. MRN: [id number]  CC:  Chief Complaint  Patient presents with  . Fall    Monday, missed a step and fell going up steps  . Shoulder Pain    left shoulder, hit when fell against stairs    HPI Rebecca Yoder presents for left shoulder pain.  She states that 2 days ago on Monday she fell.  She was going up some steps and states that her feet got crossed up and she lost her balance and fell against the rail striking her left shoulder region.  She has not seen any bruising.  She has history of left hemiparesis and spastic hemiplegia secondary to history of cerebrovascular disease.  Ambulates with a cane.  She denied any head injury.  No loss of consciousness.  No neck pain.  Past Medical History:  Diagnosis Date  . ASTHMA 12/10/2009  . Asthma   . CEREBROVASCULAR ACCIDENT, HX OF 12/10/2009  . Complication of anesthesia    "just can't get me up" (06/09/2013)  . Controlled type 2 diabetes mellitus without complication, without long-term current use of insulin (HCC)   . Embolic stroke of right basal ganglia (HCC) 06/11/2015   with L residual weakness/hemiparesis   . HYPERLIPIDEMIA 12/10/2009  . HYPERTENSION 12/10/2009  . Stroke (HCC) 2008   denies residual on 06/09/2013  . Tendonitis of wrist, right     Past Surgical History:  Procedure Laterality Date  . COSMETIC SURGERY    . OVARIAN CYST REMOVAL  1990's    Family History  Problem Relation Age of Onset  . Multiple myeloma Mother   . Breast cancer Mother   . Diabetes Mother   . Diabetes Father   . Breast cancer Maternal Aunt   . Stomach cancer Maternal Aunt   . Liver cancer Maternal Aunt   . Prostate cancer Maternal Uncle     Social History   Socioeconomic History  . Marital status: Single    Spouse name: Not on file  . Number of children: 0  . Years of education: 12th  . Highest education level: Not  on file  Occupational History  . Occupation: budd    Employer: [employer]  Tobacco Use  . Smoking status: Never Smoker  . Smokeless tobacco: Never Used  Substance and Sexual Activity  . Alcohol use: No    Alcohol/week: 0.0 standard drinks  . Drug use: No  . Sexual activity: Yes  Other Topics Concern  . Not on file  Social History Narrative  . Not on file   Social Determinants of Health   Financial Resource Strain:   . Difficulty of Paying Living Expenses: Not on file  Food Insecurity:   . Worried About Running Out of Food in the Last Year: Not on file  . Ran Out of Food in the Last Year: Not on file  Transportation Needs:   . Lack of Transportation (Medical): Not on file  . Lack of Transportation (Non-Medical): Not on file  Physical Activity:   . Days of Exercise per Week: Not on file  . Minutes of Exercise per Session: Not on file  Stress:   . Feeling of Stress : Not on file  Social Connections:   . Frequency of Communication with Friends and Family: Not on file  . Frequency of Social Gatherings with Friends and Family: Not on file  . Attends   Religious Services: Not on file  . Active Member of Clubs or Organizations: Not on file  . Attends Club or Organization Meetings: Not on file  . Marital Status: Not on file  Intimate Partner Violence:   . Fear of Current or Ex-Partner: Not on file  . Emotionally Abused: Not on file  . Physically Abused: Not on file  . Sexually Abused: Not on file    Outpatient Medications Prior to Visit  Medication Sig Dispense Refill  . acetaminophen (TYLENOL) 325 MG tablet Take 2 tablets (650 mg total) by mouth every 6 (six) hours as needed. 20 tablet 0  . albuterol (VENTOLIN HFA) 108 (90 Base) MCG/ACT inhaler INHALE 2 PUFFS BY MOUTH EVERY 6 HOURS AS NEEDED 9 g 0  . amLODipine (NORVASC) 10 MG tablet TAKE 1 TABLET BY MOUTH ONCE DAILY(MUST HAVE OFFICE VISIT FOR REFILLS) 90 tablet 1  . atorvastatin (LIPITOR) 40 MG tablet Take 1 tablet by  mouth once daily 90 tablet 0  . baclofen (LIORESAL) 10 MG tablet TAKE 1 TABLET BY MOUTH TWICE DAILY AS NEEDED FOR MUSCLE SPASM 60 tablet 0  . carvedilol (COREG) 3.125 MG tablet Take 1 tablet (3.125 mg total) by mouth 2 (two) times daily with a meal. 180 tablet 1  . clopidogrel (PLAVIX) 75 MG tablet Take 1 tablet (75 mg total) by mouth daily. 30 tablet 6  . diphenhydrAMINE (BENADRYL) 25 MG tablet Take 50 mg by mouth at bedtime.     . ferrous sulfate 325 (65 FE) MG tablet Take 1 tablet (325 mg total) by mouth daily with breakfast. 90 tablet 1  . losartan (COZAAR) 100 MG tablet Take 1 tablet by mouth once daily 90 tablet 0  . Multiple Vitamin (MULTIVITAMIN WITH MINERALS) TABS tablet Take 1 tablet by mouth daily.    . pantoprazole (PROTONIX) 40 MG tablet TAKE 1 TABLET BY MOUTH prn 90 tablet 1  . polyethylene glycol (MIRALAX / GLYCOLAX) packet Take 17 g by mouth daily. 30 each 1  . SUTAB 1479-225-188 MG TABS See admin instructions.     . triamcinolone ointment (KENALOG) 0.5 % Apply 1 application topically 2 (two) times daily. 30 g 0   No facility-administered medications prior to visit.    Allergies  Allergen Reactions  . Penicillins Rash    ROS Review of Systems  Constitutional: Negative for chills and fever.  Neurological: Negative for headaches.      Objective:    Physical Exam Vitals reviewed.  Constitutional:      Appearance: Normal appearance.  Cardiovascular:     Rate and Rhythm: Normal rate and regular rhythm.  Pulmonary:     Effort: Pulmonary effort is normal.     Breath sounds: Normal breath sounds.  Musculoskeletal:     Comments: Left shoulder reveals fairly good range of motion.  No visible ecchymosis or swelling.  She has some mild nonspecific very poorly localized tenderness.  No clavicular tenderness.  No scapular tenderness.  Neurological:     Mental Status: She is alert.     BP 122/78   Pulse 84   Temp 97.8 F (36.6 C) (Oral)   Ht 5' 6" (1.676 m)   Wt  187 lb 8 oz (85 kg)   SpO2 96%   BMI 30.26 kg/m  Wt Readings from Last 3 Encounters:  05/09/20 187 lb 8 oz (85 kg)  04/05/20 190 lb 6.4 oz (86.4 kg)  03/12/20 190 lb (86.2 kg)     Health Maintenance Due  Topic Date   Due  . Hepatitis C Screening  Never done  . FOOT EXAM  01/12/2019  . OPHTHALMOLOGY EXAM  06/23/2019  . INFLUENZA VACCINE  03/11/2020    There are no preventive care reminders to display for this patient.  Lab Results  Component Value Date   TSH 4.66 (H) 11/18/2019   Lab Results  Component Value Date   WBC 5.8 02/22/2020   HGB 11.5 (L) 02/22/2020   HCT 35.1 (L) 02/22/2020   MCV 83.8 02/22/2020   PLT 391.0 02/22/2020   Lab Results  Component Value Date   NA 140 11/18/2019   K 4.2 11/18/2019   CO2 28 11/18/2019   GLUCOSE 93 11/18/2019   BUN 9 11/18/2019   CREATININE 0.69 11/18/2019   BILITOT 0.3 11/18/2019   ALKPHOS 93 11/18/2019   AST 16 11/18/2019   ALT 18 11/18/2019   PROT 7.3 11/18/2019   ALBUMIN 4.3 11/18/2019   CALCIUM 9.8 11/18/2019   ANIONGAP 7 06/15/2015   GFR 108.22 11/18/2019   Lab Results  Component Value Date   CHOL 107 11/18/2019   Lab Results  Component Value Date   HDL 46.30 11/18/2019   Lab Results  Component Value Date   LDLCALC 46 11/18/2019   Lab Results  Component Value Date   TRIG 77.0 11/18/2019   Lab Results  Component Value Date   CHOLHDL 2 11/18/2019   Lab Results  Component Value Date   HGBA1C 6.4 (A) 02/17/2020      Assessment & Plan:   Problem List Items Addressed This Visit      Unprioritized   Left shoulder pain - Primary   Relevant Orders   DG Shoulder Left    Patient presents with acute left shoulder pain following fall.  Hopefully this is just contusion.  Obtain x-rays left shoulder to further assess.  If negative, focus on good range of motion to avoid adhesive capsulitis  No orders of the defined types were placed in this encounter.   Follow-up: No follow-ups on file.    Carolann Littler, MD

## 2020-05-09 NOTE — Patient Instructions (Signed)
We will call you when x-ray over read.

## 2020-05-10 ENCOUNTER — Other Ambulatory Visit: Payer: Self-pay

## 2020-05-10 ENCOUNTER — Telehealth: Payer: Self-pay | Admitting: *Deleted

## 2020-05-10 NOTE — Telephone Encounter (Signed)
Dr. Havery Moros,  This pt had to reschedule her colonoscopy to 07-03-20.  She is on Plavix.  Is it ok to use the 03-22-20 hold from Dr. Deniece Ree for her procedure?    Thanks, [employer]

## 2020-05-10 NOTE — Telephone Encounter (Signed)
Okay; thanks.

## 2020-05-10 NOTE — Telephone Encounter (Signed)
Yes I think okay to do that. When was she scheduled for originally?

## 2020-05-10 NOTE — Telephone Encounter (Signed)
She originally was scheduled for Thursday 05-24-20.

## 2020-05-11 ENCOUNTER — Encounter: Payer: Self-pay | Admitting: Internal Medicine

## 2020-05-11 ENCOUNTER — Ambulatory Visit (INDEPENDENT_AMBULATORY_CARE_PROVIDER_SITE_OTHER): Payer: Medicare HMO | Admitting: Internal Medicine

## 2020-05-11 VITALS — BP 130/84 | HR 79 | Temp 98.0°F | Wt 185.9 lb

## 2020-05-11 DIAGNOSIS — E7800 Pure hypercholesterolemia, unspecified: Secondary | ICD-10-CM

## 2020-05-11 DIAGNOSIS — I10 Essential (primary) hypertension: Secondary | ICD-10-CM | POA: Diagnosis not present

## 2020-05-11 DIAGNOSIS — I63511 Cerebral infarction due to unspecified occlusion or stenosis of right middle cerebral artery: Secondary | ICD-10-CM

## 2020-05-11 DIAGNOSIS — Z23 Encounter for immunization: Secondary | ICD-10-CM | POA: Diagnosis not present

## 2020-05-11 DIAGNOSIS — N3000 Acute cystitis without hematuria: Secondary | ICD-10-CM

## 2020-05-11 DIAGNOSIS — G8194 Hemiplegia, unspecified affecting left nondominant side: Secondary | ICD-10-CM

## 2020-05-11 DIAGNOSIS — R300 Dysuria: Secondary | ICD-10-CM

## 2020-05-11 DIAGNOSIS — E119 Type 2 diabetes mellitus without complications: Secondary | ICD-10-CM

## 2020-05-11 DIAGNOSIS — J4530 Mild persistent asthma, uncomplicated: Secondary | ICD-10-CM | POA: Diagnosis not present

## 2020-05-11 DIAGNOSIS — N3001 Acute cystitis with hematuria: Secondary | ICD-10-CM

## 2020-05-11 LAB — POCT URINALYSIS DIPSTICK
Bilirubin, UA: NEGATIVE
Blood, UA: POSITIVE
Glucose, UA: NEGATIVE
Ketones, UA: NEGATIVE
Nitrite, UA: POSITIVE
Protein, UA: POSITIVE — AB
Spec Grav, UA: 1.025 (ref 1.010–1.025)
Urobilinogen, UA: 0.2 E.U./dL
pH, UA: 5.5 (ref 5.0–8.0)

## 2020-05-11 LAB — POCT GLYCOSYLATED HEMOGLOBIN (HGB A1C): Hemoglobin A1C: 6.3 % — AB (ref 4.0–5.6)

## 2020-05-11 MED ORDER — SULFAMETHOXAZOLE-TRIMETHOPRIM 800-160 MG PO TABS: 1.0000 | ORAL_TABLET | Freq: Two times a day (BID) | ORAL | 0 refills | Status: AC

## 2020-05-11 NOTE — Progress Notes (Signed)
Established Patient Office Visit     This visit occurred during the SARS-CoV-2 public health emergency.  Safety protocols were in place, including screening questions prior to the visit, additional usage of staff PPE, and extensive cleaning of exam room while observing appropriate contact time as indicated for disinfecting solutions.    CC/Reason for Visit: 13-monthfollow-up chronic medical conditions, discuss dysuria  HPI: Rebecca Henchis a 54y.o. female who is coming in today for the above mentioned reasons. Past Medical History is significant for: right MCA CVA in 2016 with significant residual left spastic hemiplegia managed by Dr. KLetta Patewith Botox injections. She also has well-controlled hypertension, hyperlipidemia and diabetes.  She also has a history of asthma that has been well controlled with as needed albuterol.  2 days ago she noticed dysuria and a pressure sensation after emptying her bladder as well as urinary frequency.  No fevers, no abdominal pain, no nausea, no vomiting.  She has otherwise been doing well.  She is requesting her flu vaccine today.   Past Medical/Surgical History: Past Medical History:  Diagnosis Date  . ASTHMA 12/10/2009  . Asthma   . CEREBROVASCULAR ACCIDENT, HX OF 12/10/2009  . Complication of anesthesia    "just can't get me up" (06/09/2013)  . Controlled type 2 diabetes mellitus without complication, without long-term current use of insulin ([place]   . [place] 06/11[place]   with L residual weakness/hemiparesis   . HYPERLIPIDEMIA 12/10/2009  . HYPERTENSION 12/10/2009  . Stroke ([facility] 2008   denies residual on 06/09/2013  . Tendonitis of wrist, right     Past Surgical History:  Procedure Laterality Date  . COSMETIC SURGERY    . OVARIAN CYST REMOVAL  1990's    Social History:  reports that she has never smoked. She has never used smokeless tobacco. She reports that she does not drink alcohol and  does not use drugs.  Allergies: Allergies  Allergen Reactions  . Penicillins Rash    Family History:  Family History  Problem Relation Age of Onset  . Multiple myeloma Mother   . Breast cancer Mother   . Diabetes Mother   . Diabetes Father   . Breast cancer Maternal Aunt   . Stomach cancer Maternal Aunt   . Liver cancer Maternal Aunt   . Prostate cancer Maternal Uncle      Current Outpatient Medications:  .  acetaminophen (TYLENOL) 325 MG tablet, Take 2 tablets (650 mg total) by mouth every 6 (six) hours as needed., Disp: 20 tablet, Rfl: 0 .  albuterol (VENTOLIN HFA) 108 (90 Base) MCG/ACT inhaler, INHALE 2 PUFFS BY MOUTH EVERY 6 HOURS AS NEEDED, Disp: 9 g, Rfl: 0 .  amLODipine (NORVASC) 10 MG tablet, TAKE 1 TABLET BY MOUTH ONCE DAILY(MUST HAVE OFFICE VISIT FOR REFILLS), Disp: 90 tablet, Rfl: 1 .  atorvastatin (LIPITOR) 40 MG tablet, Take 1 tablet by mouth once daily, Disp: 90 tablet, Rfl: 0 .  baclofen (LIORESAL) 10 MG tablet, TAKE 1 TABLET BY MOUTH TWICE DAILY AS NEEDED FOR MUSCLE SPASM, Disp: 60 tablet, Rfl: 0 .  carvedilol (COREG) 3.125 MG tablet, Take 1 tablet (3.125 mg total) by mouth 2 (two) times daily with a meal., Disp: 180 tablet, Rfl: 1 .  clopidogrel (PLAVIX) 75 MG tablet, Take 1 tablet (75 mg total) by mouth daily., Disp: 30 tablet, Rfl: 6 .  diphenhydrAMINE (BENADRYL) 25 MG tablet, Take 50 mg by mouth at bedtime. , Disp: ,  Rfl:  .  ferrous sulfate 325 (65 FE) MG tablet, Take 1 tablet (325 mg total) by mouth daily with breakfast., Disp: 90 tablet, Rfl: 1 .  losartan (COZAAR) 100 MG tablet, Take 1 tablet by mouth once daily, Disp: 90 tablet, Rfl: 0 .  Multiple Vitamin (MULTIVITAMIN WITH MINERALS) TABS tablet, Take 1 tablet by mouth daily., Disp: , Rfl:  .  pantoprazole (PROTONIX) 40 MG tablet, TAKE 1 TABLET BY MOUTH prn, Disp: 90 tablet, Rfl: 1 .  polyethylene glycol (MIRALAX / GLYCOLAX) packet, Take 17 g by mouth daily., Disp: 30 each, Rfl: 1 .  SUTAB 720 [id number] MG  TABS, See admin instructions. , Disp: , Rfl:  .  triamcinolone ointment (KENALOG) 0.5 %, Apply 1 application topically 2 (two) times daily., Disp: 30 g, Rfl: 0  Review of Systems:  Constitutional: Denies fever, chills, diaphoresis, appetite change and fatigue.  HEENT: Denies photophobia, eye pain, redness, hearing loss, ear pain, congestion, sore throat, rhinorrhea, sneezing, mouth sores, trouble swallowing, neck pain, neck stiffness and tinnitus.   Respiratory: Denies SOB, DOE, cough, chest tightness,  and wheezing.   Cardiovascular: Denies chest pain, palpitations and leg swelling.  Gastrointestinal: Denies nausea, vomiting, abdominal pain, diarrhea, constipation, blood in stool and abdominal distention.  Genitourinary: Denies  hematuria, flank pain and difficulty urinating.  Endocrine: Denies: hot or cold intolerance, sweats, changes in hair or nails, polyuria, polydipsia. Musculoskeletal: Denies myalgias, back pain, joint swelling, arthralgias and gait problem.  Skin: Denies pallor, rash and wound.  Neurological: Denies dizziness, seizures, syncope, weakness, light-headedness, numbness and headaches.  Hematological: Denies adenopathy. Easy bruising, personal or family bleeding history  Psychiatric/Behavioral: Denies suicidal ideation, mood changes, confusion, nervousness, sleep disturbance and agitation    Physical Exam: Vitals:   05/11/20 1123  BP: 130/84  Pulse: 79  Temp: 98 F (36.7 C)  TempSrc: Oral  SpO2: 98%  Weight: 185 lb 14.4 oz (84.3 kg)    Body mass index is 30.01 kg/m.   Constitutional: NAD, calm, comfortable Eyes: PERRL, lids and conjunctivae normal, wears corrective lenses ENMT: Mucous membranes are moist. Respiratory: clear to auscultation bilaterally, no wheezing, no crackles. Normal respiratory effort. No accessory muscle use.  Cardiovascular: Regular rate and rhythm, no murmurs / rubs / gallops. No extremity edema.  Abdomen: no tenderness, no masses  palpated. No hepatosplenomegaly. Bowel sounds positive.  Musculoskeletal: no clubbing / cyanosis. No joint deformity upper and lower extremities. Good ROM, no contractures. Normal muscle tone.  Skin: no rashes, lesions, ulcers. No induration Neurologic: Left hemiparesis Psychiatric: Normal judgment and insight. Alert and oriented x 3. Normal mood.    Impression and Plan:  Controlled type 2 diabetes mellitus without complication, without long-term current use of insulin ([place])  -Well-controlled with an A1c of 6.3 today.  Needs flu shot  - Plan: Flu Vaccine QUAD 6+ mos PF IM (Fluarix Quad PF)  Dysuria -Unable to give me a urine sample today. -She will take sterile cup with her and bring sample back for testing.  Withhold antibiotics for now.  Essential hypertension -Well-controlled.  Right middle cerebral artery stroke (HCC) Left hemiparesis (HCC) -Noted, follows with Dr. Letta Pate.  Pure hypercholesterolemia -Last LDL was at goal of 46 in April 2021.  Mild persistent asthma without complication -Well-controlled on albuterol.     Lelon Frohlich, MD [facility]

## 2020-05-13 LAB — URINE CULTURE
MICRO NUMBER:: 11021242
SPECIMEN QUALITY:: ADEQUATE

## 2020-05-17 ENCOUNTER — Other Ambulatory Visit: Payer: Self-pay

## 2020-05-17 ENCOUNTER — Encounter: Payer: Self-pay | Admitting: Physical Medicine & Rehabilitation

## 2020-05-17 ENCOUNTER — Encounter: Payer: Medicare HMO | Attending: Physical Medicine & Rehabilitation | Admitting: Physical Medicine & Rehabilitation

## 2020-05-17 VITALS — BP 123/80 | HR 88 | Temp 98.3°F | Ht 66.0 in | Wt 188.0 lb

## 2020-05-17 DIAGNOSIS — G8110 Spastic hemiplegia affecting unspecified side: Secondary | ICD-10-CM

## 2020-05-17 DIAGNOSIS — Z8673 Personal history of transient ischemic attack (TIA), and cerebral infarction without residual deficits: Secondary | ICD-10-CM | POA: Insufficient documentation

## 2020-05-17 DIAGNOSIS — R55 Syncope and collapse: Secondary | ICD-10-CM | POA: Diagnosis not present

## 2020-05-17 NOTE — Patient Instructions (Signed)
Please take Blood pressure before you take your am BP medication , if top number is <100 then would hold the amlodipine and call Dr Deniece Ree  I have made a referral to neurology to evaluate Left hand tingling

## 2020-05-17 NOTE — Progress Notes (Signed)
Subjective:    Patient ID: Rebecca Yoder, female    DOB: 1965/10/10, 54 y.o.   MRN: [id number]  HPI [place] ~1wk ago while going up 3 steps.  Fell on to Left shoulder against rail.  Xrays negative , now able to raise arm  Second issue was an episode where she became unresponsive when in bathroom.  Was not using toilet.  She quickly  Improved.  THis occurred before.  Patient does not take her blood pressure at home but does have a cough and is able to do so if needed. Wasn't eating good , felt like antibiotics for bladder infection caused nausea and poor appetite.  Pt sleeping more.  Did not eat or drink  much  yesterday  BP on Tuesday night 80/60, sister does not know what blood pressure was yesterday.  In addition patient is complaining of left hand tingling.  She is not sure exactly when it started but may be a few weeks ago.  She thinks it is all of her fingers.  She does not note any new weakness in the left upper no new problems with ambulation or cognition or swallowing. Past history significant for carpal tunnel when she is working but she does not recall which hand.  She has no history of neck problems and denies any neck pain.  No history of prior cervical spine or upper extremity surgeries. Pain Inventory Average Pain 0 Pain Right Now 0 My pain is na  LOCATION OF PAIN none  BOWEL Number of stools per week:  Oral laxative use Yes  Type of laxative  Enema or suppository use No  History of colostomy No  Incontinent No   BLADDER Normal In and out cath, frequency not applicable Able to self cath No  Bladder incontinence No  Frequent urination No  Leakage with coughing No  Difficulty starting stream No  Incomplete bladder emptying No    Mobility use a cane ability to climb steps?  yes do you drive?  no  Function disabled: date disabled .  Neuro/Psych weakness trouble walking  Prior Studies Any changes since last visit?  yes x-rays  Physicians involved  in your care Primary care . GI   Family History  Problem Relation Age of Onset  . Multiple myeloma Mother   . Breast cancer Mother   . Diabetes Mother   . Diabetes Father   . Breast cancer Maternal Aunt   . Stomach cancer Maternal Aunt   . Liver cancer Maternal Aunt   . Prostate cancer Maternal Uncle    Social History   Socioeconomic History  . Marital status: Single    Spouse name: Not on file  . Number of children: 0  . Years of education: 12th  . Highest education level: Not on file  Occupational History  . Occupation: budd    Fish farm manager: [employer]  Tobacco Use  . Smoking status: Never Smoker  . Smokeless tobacco: Never Used  Substance and Sexual Activity  . Alcohol use: No    Alcohol/week: 0.0 standard drinks  . Drug use: No  . Sexual activity: Yes  Other Topics Concern  . Not on file  Social History Narrative  . Not on file   Social Determinants of Health   Financial Resource Strain:   . Difficulty of Paying Living Expenses: Not on file  Food Insecurity:   . Worried About Charity fundraiser in the Last Year: Not on file  . Ran Out of Food  in the Last Year: Not on file  Transportation Needs:   . Lack of Transportation (Medical): Not on file  . Lack of Transportation (Non-Medical): Not on file  Physical Activity:   . Days of Exercise per Week: Not on file  . Minutes of Exercise per Session: Not on file  Stress:   . Feeling of Stress : Not on file  Social Connections:   . Frequency of Communication with Friends and Family: Not on file  . Frequency of Social Gatherings with Friends and Family: Not on file  . Attends Religious Services: Not on file  . Active Member of Clubs or Organizations: Not on file  . Attends Archivist Meetings: Not on file  . Marital Status: Not on file   Past Surgical History:  Procedure Laterality Date  . COSMETIC SURGERY    . OVARIAN CYST REMOVAL  1990's   Past Medical History:  Diagnosis Date  . ASTHMA  12/10/2009  . Asthma   . CEREBROVASCULAR ACCIDENT, HX OF 12/10/2009  . Complication of anesthesia    "just can't get me up" (06/09/2013)  . Controlled type 2 diabetes mellitus without complication, without long-term current use of insulin ([place])   . Embolic stroke of right basal ganglia ([place]) 06/11/2015   with L residual weakness/hemiparesis   . HYPERLIPIDEMIA 12/10/2009  . HYPERTENSION 12/10/2009  . Stroke [facility]) 2008   denies residual on 06/09/2013  . Tendonitis of wrist, right    BP 123/80   Pulse 88   Temp 98.3 F (36.8 C)   Ht _0  (1.676 m)   Wt 188 lb (85.3 kg)   SpO2 96%   BMI 30.34 kg/m   Opioid Risk Score:   Fall Risk Score:  `1  Depression screen PHQ 2/9  Depression screen [facility] 2/9 04/05/2020 02/23/2020 04/19/2019 02/22/2019 10/27/2018 10/20/2018 04/13/2018  Decreased Interest 0 0 0 _1 Down, Depressed, Hopeless 0 0 0 0 0 0 0  PHQ - 2 Score 0 0 0 _2 Altered sleeping - - 0 _3 Tired, decreased energy - - 0 0 _4 Change in appetite - - 0 0 0 0 0  Feeling bad or failure about yourself  - - 0 0 0 1 0  Trouble concentrating - - 0 0 2 2 0  Moving slowly or fidgety/restless - - 0 0 0 0 0  Suicidal thoughts - - 0 0 0 0 0  PHQ-9 Score - - 0 _5 Difficult doing work/chores - - Not difficult at all - - - -  Some recent data might be hidden    Review of Systems  Constitutional: Positive for diaphoresis.  Musculoskeletal: Positive for gait problem.  All other systems reviewed and are negative.      Objective:   Physical Exam Vitals and nursing note reviewed.  Constitutional:      Appearance: Normal appearance.  HENT:     Head: Normocephalic and atraumatic.  Eyes:     Extraocular Movements: Extraocular movements intact.     Conjunctiva/sclera: Conjunctivae normal.     Pupils: Pupils are equal, round, and reactive to light.  Cardiovascular:     Rate and Rhythm: Normal rate and regular rhythm.     Heart sounds: Normal heart sounds. No murmur heard.     Pulmonary:     Effort: Pulmonary effort is normal. No respiratory distress.     Breath sounds:  Normal breath sounds. No stridor. No wheezing.  Abdominal:     General: Abdomen is flat. Bowel sounds are normal. There is no distension.     Palpations: Abdomen is soft. There is no mass.  Musculoskeletal:        General: No tenderness.     Cervical back: No rigidity or tenderness.     Comments: No pain with cervical spine range of motion Negative impingement testing left shoulder.  Skin:    General: Skin is warm and dry.  Neurological:     Mental Status: She is alert and oriented to person, place, and time. Mental status is at baseline.     Cranial Nerves: No cranial nerve deficit or dysarthria.     Comments: Motor strength is 3 - at the left deltoid bicep tricep finger flexors and extensors 4 - at the left hip flexor 4 at the left knee extensor to minus at the ankle Ambulates with a left AFO she has a stiff leg gait decreased stance phase on the left lower limb but unchanged  Left hand has no evidence of intrinsic atrophy.  There is no evidence of tremor, sensation to light touch is normal.  Psychiatric:        Mood and Affect: Mood normal.        Behavior: Behavior normal.        Thought Content: Thought content normal.           Assessment & Plan:  #1.  Left spastic hemiplegia with plantar flexor and inverter spasticity improved after botulinum toxin injection  2.  Probable vasovagal event.  Patient quickly improved.  History of recent decline in appetite and fluid intake with low documented blood pressure.  She is now eating and drinking fine.  It appears that the Bactrim caused a lot of nausea and this is now listed as an intolerance. I discussed that she should take her blood pressure before she takes her blood pressure medication in the morning specifically the amlodipine.  If her systolic blood pressures less than 100 she should hold her medicine until she talks to Dr.  Peyton Najjar office.  3.  Left hand tingling this certainly can be an effect of her stroke although she has not noticed it much.  Her sensation is intact.  She does have a previous history of carpal tunnel syndrome but does not recall which side.  She is worried by it we will ask her to follow-up with neurology.  She has not seen neurology for >3 years so we will make a referral.

## 2020-05-18 ENCOUNTER — Ambulatory Visit: Payer: Medicare HMO | Admitting: Internal Medicine

## 2020-05-19 ENCOUNTER — Encounter: Payer: Self-pay | Admitting: Internal Medicine

## 2020-05-24 ENCOUNTER — Encounter: Payer: Medicare HMO | Admitting: Gastroenterology

## 2020-05-26 ENCOUNTER — Other Ambulatory Visit: Payer: Self-pay | Admitting: Internal Medicine

## 2020-05-26 DIAGNOSIS — E119 Type 2 diabetes mellitus without complications: Secondary | ICD-10-CM

## 2020-05-30 ENCOUNTER — Other Ambulatory Visit: Payer: Self-pay | Admitting: Internal Medicine

## 2020-05-30 DIAGNOSIS — R300 Dysuria: Secondary | ICD-10-CM

## 2020-05-30 MED ORDER — CIPROFLOXACIN HCL 500 MG PO TABS: 500.0000 mg | ORAL_TABLET | Freq: Two times a day (BID) | ORAL | 0 refills | Status: AC

## 2020-06-06 ENCOUNTER — Encounter: Payer: Self-pay | Admitting: Internal Medicine

## 2020-06-06 ENCOUNTER — Other Ambulatory Visit: Payer: Self-pay

## 2020-06-06 ENCOUNTER — Ambulatory Visit (INDEPENDENT_AMBULATORY_CARE_PROVIDER_SITE_OTHER): Payer: Medicare HMO | Admitting: Internal Medicine

## 2020-06-06 VITALS — BP 150/90 | HR 100 | Temp 98.7°F | Wt 185.2 lb

## 2020-06-06 DIAGNOSIS — N3000 Acute cystitis without hematuria: Secondary | ICD-10-CM

## 2020-06-06 DIAGNOSIS — I10 Essential (primary) hypertension: Secondary | ICD-10-CM | POA: Diagnosis not present

## 2020-06-06 DIAGNOSIS — R55 Syncope and collapse: Secondary | ICD-10-CM

## 2020-06-06 NOTE — Patient Instructions (Signed)
-  Nice seeing you today!!  -Make sure you stay hydrated and notify me if BP drops below 100.

## 2020-06-06 NOTE — Addendum Note (Signed)
Addended by: Westley Hummer B on: 06/06/2020 12:01 PM   Modules accepted: Orders

## 2020-06-06 NOTE — Progress Notes (Signed)
Established Patient Office Visit     This visit occurred during the SARS-CoV-2 public health emergency.  Safety protocols were in place, including screening questions prior to the visit, additional usage of staff PPE, and extensive cleaning of exam room while observing appropriate contact time as indicated for disinfecting solutions.    CC/Reason for Visit: Follow-up EMS call for syncope  HPI: Rebecca Yoder is a 54 y.o. female who is coming in today for the above mentioned reasons. Past Medical History is significant for: right MCA CVA in 2016 with significant residual left spastic hemiplegia managed by Dr. Letta Pate with Botox injections. She also has well-controlled hypertension, hyperlipidemia and diabetes.  She also has a history of asthma that has been well controlled with as needed albuterol.  When I last saw her on October 1 she was diagnosed with a UTI and started empirically on Bactrim pending culture data.  A few day afterwards she noticed nausea, vomiting.  She admits to not eating or drinking much.  Her sister called EMS after she passed out while straining on the toilet.  EMS noted an initial blood pressure of 72/40 that resolved to 130/70 after back fluids.  She has been taking her blood pressure medication as prescribed.  Her Bactrim was subsequently switched over to Cipro due to culture sensitivity results.  She now feels back to normal.   Past Medical/Surgical History: Past Medical History:  Diagnosis Date  . ASTHMA 12/10/2009  . Asthma   . CEREBROVASCULAR ACCIDENT, HX OF 12/10/2009  . Complication of anesthesia    "just can't get me up" (06/09/2013)  . Controlled type 2 diabetes mellitus without complication, without long-term current use of insulin ([place])   . Embolic stroke of right basal ganglia ([place]) 06/11/2015   with L residual weakness/hemiparesis   . HYPERLIPIDEMIA 12/10/2009  . HYPERTENSION 12/10/2009  . Stroke [facility]) 2008   denies residual on 06/09/2013  .  Tendonitis of wrist, right     Past Surgical History:  Procedure Laterality Date  . COSMETIC SURGERY    . OVARIAN CYST REMOVAL  1990's    Social History:  reports that she has never smoked. She has never used smokeless tobacco. She reports that she does not drink alcohol and does not use drugs.  Allergies: Allergies  Allergen Reactions  . Bactrim [Sulfamethoxazole-Trimethoprim] Nausea Only    Nausea   . Penicillins Rash    Family History:  Family History  Problem Relation Age of Onset  . Multiple myeloma Mother   . Breast cancer Mother   . Diabetes Mother   . Diabetes Father   . Breast cancer Maternal Aunt   . Stomach cancer Maternal Aunt   . Liver cancer Maternal Aunt   . Prostate cancer Maternal Uncle      Current Outpatient Medications:  .  acetaminophen (TYLENOL) 325 MG tablet, Take 2 tablets (650 mg total) by mouth every 6 (six) hours as needed., Disp: 20 tablet, Rfl: 0 .  albuterol (VENTOLIN HFA) 108 (90 Base) MCG/ACT inhaler, INHALE 2 PUFFS BY MOUTH EVERY 6 HOURS AS NEEDED, Disp: 9 g, Rfl: 0 .  amLODipine (NORVASC) 10 MG tablet, TAKE 1 TABLET BY MOUTH ONCE DAILY(MUST HAVE OFFICE VISIT FOR REFILLS), Disp: 90 tablet, Rfl: 1 .  atorvastatin (LIPITOR) 40 MG tablet, Take 1 tablet by mouth once daily, Disp: 90 tablet, Rfl: 0 .  baclofen (LIORESAL) 10 MG tablet, TAKE 1 TABLET BY MOUTH TWICE DAILY AS NEEDED FOR MUSCLE SPASM, Disp: 60  tablet, Rfl: 0 .  carvedilol (COREG) 3.125 MG tablet, Take 1 tablet (3.125 mg total) by mouth 2 (two) times daily with a meal., Disp: 180 tablet, Rfl: 1 .  clopidogrel (PLAVIX) 75 MG tablet, Take 1 tablet (75 mg total) by mouth daily., Disp: 30 tablet, Rfl: 6 .  diphenhydrAMINE (BENADRYL) 25 MG tablet, Take 50 mg by mouth at bedtime. , Disp: , Rfl:  .  ferrous sulfate 325 (65 FE) MG tablet, Take 1 tablet (325 mg total) by mouth daily with breakfast., Disp: 90 tablet, Rfl: 1 .  losartan (COZAAR) 100 MG tablet, Take 1 tablet by mouth once daily,  Disp: 90 tablet, Rfl: 0 .  Multiple Vitamin (MULTIVITAMIN WITH MINERALS) TABS tablet, Take 1 tablet by mouth daily., Disp: , Rfl:  .  pantoprazole (PROTONIX) 40 MG tablet, TAKE 1 TABLET BY MOUTH prn, Disp: 90 tablet, Rfl: 1 .  polyethylene glycol (MIRALAX / GLYCOLAX) packet, Take 17 g by mouth daily., Disp: 30 each, Rfl: 1 .  SUTAB [phone] MG TABS, See admin instructions. , Disp: , Rfl:  .  triamcinolone ointment (KENALOG) 0.5 %, Apply 1 application topically 2 (two) times daily., Disp: 30 g, Rfl: 0  Review of Systems:  Constitutional: Denies fever, chills, diaphoresis, appetite change and fatigue.  HEENT: Denies photophobia, eye pain, redness, hearing loss, ear pain, congestion, sore throat, rhinorrhea, sneezing, mouth sores, trouble swallowing, neck pain, neck stiffness and tinnitus.   Respiratory: Denies SOB, DOE, cough, chest tightness,  and wheezing.   Cardiovascular: Denies chest pain, palpitations and leg swelling.  Gastrointestinal: Denies nausea, vomiting, abdominal pain, diarrhea, constipation, blood in stool and abdominal distention.  Genitourinary: Denies dysuria, urgency, frequency, hematuria, flank pain and difficulty urinating.  Endocrine: Denies: hot or cold intolerance, sweats, changes in hair or nails, polyuria, polydipsia. Musculoskeletal: Denies joint swelling, arthralgias. Skin: Denies pallor, rash and wound.  Neurological: Denies dizziness, seizures, syncope, weakness, light-headedness, numbness and headaches.  Hematological: Denies adenopathy. Easy bruising, personal or family bleeding history  Psychiatric/Behavioral: Denies suicidal ideation, mood changes, confusion, nervousness, sleep disturbance and agitation    Physical Exam: Vitals:   06/06/20 1106  BP: (!) 150/90  Pulse: 100  Temp: 98.7 F (37.1 C)  TempSrc: Oral  SpO2: 99%  Weight: 185 lb 3.2 oz (84 kg)    Body mass index is 29.89 kg/m.   Constitutional: NAD, calm, comfortable Eyes: PERRL,  lids and conjunctivae normal, wears corrective lenses ENMT: Mucous membranes are moist.  Respiratory: clear to auscultation bilaterally, no wheezing, no crackles. Normal respiratory effort. No accessory muscle use.  Cardiovascular: Regular rate and rhythm, no murmurs / rubs / gallops. No extremity edema.  Abdomen: no tenderness, no masses palpated. No hepatosplenomegaly. Bowel sounds positive.   Psychiatric: Normal judgment and insight. Alert and oriented x 3. Normal mood.    Impression and Plan:  Syncope, unspecified syncope type Acute cystitis without hematuria Essential hypertension  -Syncope was likely either vasovagal or orthostatic given hypotension that was noted by EMS on scene. -She has brought over paperwork given by EMS as well as EKG strips.  These have been reviewed in detail. -Blood pressure is elevated in office today to 150/90, however given this recent event I will not change blood pressure medication today. -She will provide another urine sample today to send out for UA and culture. -She now feels back to baseline, I do not feel that lab work is necessary today.  Time spent: 31 minutes. Greater than 50% of this time was spent in  direct contact with the patient, coordinating care and discussing relevant ongoing clinical issues, including how to dose blood pressure medication depending on blood pressure measurements as well as reviewing EMS documentation.     Patient Instructions  -Nice seeing you today!!  -Make sure you stay hydrated and notify me if BP drops below 100.     Lelon Frohlich, MD [facility] at [facility]

## 2020-06-06 NOTE — Addendum Note (Signed)
Addended by: Marrion Coy on: 06/06/2020 01:06 PM   Modules accepted: Orders

## 2020-06-07 LAB — URINALYSIS
Bilirubin Urine: NEGATIVE
Glucose, UA: NEGATIVE
Hgb urine dipstick: NEGATIVE
Ketones, ur: NEGATIVE
Leukocytes,Ua: NEGATIVE
Nitrite: NEGATIVE
Protein, ur: NEGATIVE
Specific Gravity, Urine: 1.007 (ref 1.001–1.03)
pH: 5.5 (ref 5.0–8.0)

## 2020-06-07 LAB — URINE CULTURE
MICRO NUMBER:: 11125811
SPECIMEN QUALITY:: ADEQUATE

## 2020-06-20 ENCOUNTER — Other Ambulatory Visit: Payer: Self-pay

## 2020-06-20 ENCOUNTER — Ambulatory Visit (AMBULATORY_SURGERY_CENTER): Payer: Self-pay | Admitting: *Deleted

## 2020-06-20 VITALS — Ht 66.0 in | Wt 187.0 lb

## 2020-06-20 DIAGNOSIS — Z86010 Personal history of colonic polyps: Secondary | ICD-10-CM

## 2020-06-20 NOTE — Progress Notes (Signed)
Completed covid vaccines 11-20-19  Pt is aware that care partner will wait in the car during procedure; if they feel like they will be too hot or cold to wait in the car; they may wait in the 4 th floor lobby. Patient is aware to bring only one care partner. We want them to wear a mask (we do not have any that we can provide them), practice social distancing, and we will check their temperatures when they get here.  I did remind the patient that their care partner needs to stay in the parking lot the entire time and have a cell phone available, we will call them when the pt is ready for discharge. Patient will wear mask into building.   No trouble with anesthesia, difficulty with moving neck or hx/fam hx of malignant hyperthermia per pt   No egg or soy allergy  No home oxygen use   No medications for weight loss taken  Pt denies constipation issues  Sutab sample given

## 2020-06-21 ENCOUNTER — Encounter: Payer: Self-pay | Admitting: Gastroenterology

## 2020-06-21 ENCOUNTER — Other Ambulatory Visit: Payer: Self-pay | Admitting: Internal Medicine

## 2020-06-21 DIAGNOSIS — J4530 Mild persistent asthma, uncomplicated: Secondary | ICD-10-CM

## 2020-07-03 ENCOUNTER — Ambulatory Visit (AMBULATORY_SURGERY_CENTER): Payer: Medicare HMO | Admitting: Gastroenterology

## 2020-07-03 ENCOUNTER — Other Ambulatory Visit: Payer: Self-pay

## 2020-07-03 ENCOUNTER — Encounter: Payer: Self-pay | Admitting: Gastroenterology

## 2020-07-03 VITALS — BP 122/64 | HR 78 | Temp 97.7°F | Resp 20 | Ht 66.0 in | Wt 187.0 lb

## 2020-07-03 DIAGNOSIS — Z86010 Personal history of colonic polyps: Secondary | ICD-10-CM

## 2020-07-03 MED ORDER — SODIUM CHLORIDE 0.9 % IV SOLN: 500.0000 mL | Freq: Once | INTRAVENOUS | Status: DC

## 2020-07-03 NOTE — Progress Notes (Signed)
pt tolerated well. VSS. awake and to recovery. Report given to RN.  

## 2020-07-03 NOTE — Patient Instructions (Signed)
Resume previous diet and medications, including Plavix, today.   YOU HAD AN ENDOSCOPIC PROCEDURE TODAY AT [facility]:   Refer to the procedure report that was given to you for any specific questions about what was found during the examination.  If the procedure report does not answer your questions, please call your gastroenterologist to clarify.  If you requested that your care partner not be given the details of your procedure findings, then the procedure report has been included in a sealed envelope for you to review at your convenience later.  YOU SHOULD EXPECT: Some feelings of bloating in the abdomen. Passage of more gas than usual.  Walking can help get rid of the air that was put into your GI tract during the procedure and reduce the bloating. If you had a lower endoscopy (such as a colonoscopy or flexible sigmoidoscopy) you may notice spotting of blood in your stool or on the toilet paper. If you underwent a bowel prep for your procedure, you may not have a normal bowel movement for a few days.  Please Note:  You might notice some irritation and congestion in your nose or some drainage.  This is from the oxygen used during your procedure.  There is no need for concern and it should clear up in a day or so.  SYMPTOMS TO REPORT IMMEDIATELY:   Following lower endoscopy (colonoscopy or flexible sigmoidoscopy):  Excessive amounts of blood in the stool  Significant tenderness or worsening of abdominal pains  Swelling of the abdomen that is new, acute  Fever of 100F or higher   For urgent or emergent issues, a gastroenterologist can be reached at any hour by calling [phone]. Do not use MyChart messaging for urgent concerns.    DIET:  We do recommend a small meal at first, but then you may proceed to your regular diet.  Drink plenty of fluids but you should avoid alcoholic beverages for 24 hours.  ACTIVITY:  You should plan to take it easy for the rest of today and  you should NOT DRIVE or use heavy machinery until tomorrow (because of the sedation medicines used during the test).    FOLLOW UP: Our staff will call the number listed on your records 48-72 hours following your procedure to check on you and address any questions or concerns that you may have regarding the information given to you following your procedure. If we do not reach you, we will leave a message.  We will attempt to reach you two times.  During this call, we will ask if you have developed any symptoms of COVID 19. If you develop any symptoms (ie: fever, flu-like symptoms, shortness of breath, cough etc.) before then, please call [phone].  If you test positive for Covid 19 in the 2 weeks post procedure, please call and report this information to [place].    If any biopsies were taken you will be contacted by phone or by letter within the next 1-3 weeks.  Please call us at [phone] if you have not heard about the biopsies in 3 weeks.    SIGNATURES/CONFIDENTIALITY: You and/or your care partner have signed paperwork which will be entered into your electronic medical record.  These signatures attest to the fact that that the information above on your After Visit Summary has been reviewed and is understood.  Full responsibility of the confidentiality of this discharge information lies with you and/or your care-partner.

## 2020-07-03 NOTE — Progress Notes (Signed)
Vital signs checked by:MO  The patient states no changes in medical or surgical history since pre-visit screening on 06/20/20.

## 2020-07-03 NOTE — Op Note (Signed)
[place] Patient Name: Rebecca Yoder Procedure Date: 07/03/2020 8:44 AM MRN: [id number] Endoscopist: Remo Lipps P. Havery Moros , MD Age: 54 Referring MD:  Date of Birth: 1966/05/16 Gender: Female Account #: [id number] Procedure:                Colonoscopy Indications:              High risk colon cancer surveillance: Personal                            history of colonic polyps - history of colonoscopy                            in August, one small sessile serrated polyp                            removed. Striped erythema noted in the descending                            colon, concern for possible resolving ischemic                            colitis. Biopsies showed "adenomatous change"                            despite no overt polyp appreciated. Here for close                            follow up to re-evaluate left colon Medicines:                Monitored Anesthesia Care Procedure:                Pre-Anesthesia Assessment:                           - Prior to the procedure, a History and Physical                            was performed, and patient medications and                            allergies were reviewed. The patient's tolerance of                            previous anesthesia was also reviewed. The risks                            and benefits of the procedure and the sedation                            options and risks were discussed with the patient.                            All questions were answered, and informed consent  was obtained. Prior Anticoagulants: The patient has                            taken Plavix (clopidogrel), last dose was 5 days                            prior to procedure. ASA Grade Assessment: III - A                            patient with severe systemic disease. After                            reviewing the risks and benefits, the patient was                            deemed in satisfactory  condition to undergo the                            procedure.                           After obtaining informed consent, the colonoscope                            was passed under direct vision. Throughout the                            procedure, the patient's blood pressure, pulse, and                            oxygen saturations were monitored continuously. The                            Colonoscope was introduced through the anus and                            advanced to the the terminal ileum, with                            identification of the appendiceal orifice and IC                            valve. The colonoscopy was performed without                            difficulty. The patient tolerated the procedure                            well. The quality of the bowel preparation was                            good. The terminal ileum, ileocecal valve,  appendiceal orifice, and rectum were photographed. Scope In: 8:54:36 AM Scope Out: 9:10:14 AM Scope Withdrawal Time: 0 hours 12 minutes 41 seconds  Total Procedure Duration: 0 hours 15 minutes 38 seconds  Findings:                 The perianal and digital rectal examinations were                            normal.                           The terminal ileum appeared normal.                           The exam was otherwise normal throughout the                            examined colon. No pathology noted in the left                            colon to correlate with prior biopsy results from                            last colonoscopy. No residual / obvious polypoid                            mucosa noted. Multiple passes made in the                            descending colon. Complications:            No immediate complications. Estimated blood loss:                            None. Estimated Blood Loss:     Estimated blood loss: none. Impression:               - The examined portion of the  ileum was normal.                           - Normal colon otherwise - no evidence of any                            polyps or pathology noted to correlate with prior                            biopsy result. Unclear if prior biopsy result was                            contaminated? Recommendation:           - Patient has a contact number available for                            emergencies. The signs and symptoms of potential  delayed complications were discussed with the                            patient. Return to normal activities tomorrow.                            Written discharge instructions were provided to the                            patient.                           - Resume previous diet.                           - Continue present medications.                           - Resume Plavix today                           - Repeat colonoscopy in 3-5 years for surveillance                            (history of sessile serrated polyp on last exam),                            given discrepancy in path result from initial exam.                            Will discuss with the patient. Remo Lipps P. Rebecca Eberly, MD 07/03/2020 9:17:36 AM This report has been signed electronically.

## 2020-07-04 ENCOUNTER — Telehealth: Payer: Self-pay | Admitting: *Deleted

## 2020-07-04 NOTE — Telephone Encounter (Signed)
  Follow up Call-  Call back number 07/03/2020 03/12/2020  Post procedure Call Back phone  # [phone]  Permission to leave phone message Yes Yes  Some recent data might be hidden     Patient questions:  Do you have a fever, pain , or abdominal swelling? No. Pain Score  0 *  Have you tolerated food without any problems? Yes.    Have you been able to return to your normal activities? Yes.    Do you have any questions about your discharge instructions: Diet   No. Medications  No. Follow up visit  No.  Do you have questions or concerns about your Care? No.  Actions: * If pain score is 4 or above: 1. No action needed, pain <4.Have you developed a fever since your procedure? no  2.   Have you had an respiratory symptoms (SOB or cough) since your procedure? no  3.   Have you tested positive for COVID 19 since your procedure no  4.   Have you had any family members/close contacts diagnosed with the COVID 19 since your procedure?  no   If yes to any of these questions please route to Joylene John, RN and Joella Prince, RN

## 2020-07-10 ENCOUNTER — Other Ambulatory Visit: Payer: Self-pay

## 2020-07-10 ENCOUNTER — Encounter: Payer: Medicare HMO | Attending: Physical Medicine & Rehabilitation | Admitting: Physical Medicine & Rehabilitation

## 2020-07-10 ENCOUNTER — Encounter: Payer: Self-pay | Admitting: Physical Medicine & Rehabilitation

## 2020-07-10 VITALS — BP 137/81 | HR 81 | Temp 98.7°F | Ht 66.0 in | Wt 190.0 lb

## 2020-07-10 DIAGNOSIS — G8110 Spastic hemiplegia affecting unspecified side: Secondary | ICD-10-CM | POA: Diagnosis not present

## 2020-07-10 NOTE — Patient Instructions (Signed)

## 2020-07-10 NOTE — Progress Notes (Signed)
Botox Injection for spasticity using needle EMG guidance  Dilution: 50 Units/ml Indication: Severe spasticity which interferes with ADL,mobility and/or  hygiene and is unresponsive to medication management and other conservative care Informed consent was obtained after describing risks and benefits of the procedure with the patient. This includes bleeding, bruising, infection, excessive weakness, or medication side effects. A REMS form is on file and signed. Needle: 13mm 25g  needle electrode Number of units per muscle    Posterior tibialis 75 Gastroc medial 75  FHL 50 FDL 75 EHL 25 All injections were done after obtaining appropriate EMG activity and after negative drawback for blood. The patient tolerated the procedure well. Post procedure instructions were given. A followup appointment was made.

## 2020-07-22 ENCOUNTER — Other Ambulatory Visit: Payer: Self-pay | Admitting: Internal Medicine

## 2020-07-22 DIAGNOSIS — K219 Gastro-esophageal reflux disease without esophagitis: Secondary | ICD-10-CM

## 2020-07-29 ENCOUNTER — Other Ambulatory Visit: Payer: Self-pay | Admitting: Internal Medicine

## 2020-07-29 DIAGNOSIS — I10 Essential (primary) hypertension: Secondary | ICD-10-CM

## 2020-08-07 ENCOUNTER — Other Ambulatory Visit: Payer: Self-pay | Admitting: Internal Medicine

## 2020-08-07 DIAGNOSIS — I10 Essential (primary) hypertension: Secondary | ICD-10-CM

## 2020-08-08 NOTE — Telephone Encounter (Signed)
Okay to change the dosage?

## 2020-08-09 ENCOUNTER — Telehealth: Payer: Self-pay | Admitting: Internal Medicine

## 2020-08-09 MED ORDER — LOSARTAN POTASSIUM 50 MG PO TABS: 50.0000 mg | ORAL_TABLET | Freq: Two times a day (BID) | ORAL | 3 refills | Status: DC

## 2020-08-09 NOTE — Telephone Encounter (Signed)
Rx sent 

## 2020-08-09 NOTE — Telephone Encounter (Signed)
Ok to prescribe 50 mg instead and give 60 tabs per month to take 2 a day.

## 2020-08-09 NOTE — Addendum Note (Signed)
Addended by: Kern Reap B on: 08/09/2020 01:59 PM   Modules accepted: Orders

## 2020-08-09 NOTE — Telephone Encounter (Signed)
Patient is needing a new Rx for losartan (COZAAR) 100 MG tablet, the pharmacy doesn't have any of this medication in stock. This medication has been recalled.  [facility] [address] ([address]), [place] - [address] Phone:  [phone]  Fax:  [phone]

## 2020-08-14 ENCOUNTER — Ambulatory Visit: Payer: Medicare HMO | Admitting: Internal Medicine

## 2020-08-15 ENCOUNTER — Other Ambulatory Visit: Payer: Self-pay

## 2020-08-16 ENCOUNTER — Encounter: Payer: Self-pay | Admitting: Internal Medicine

## 2020-08-16 ENCOUNTER — Ambulatory Visit (INDEPENDENT_AMBULATORY_CARE_PROVIDER_SITE_OTHER): Payer: Medicare HMO | Admitting: Internal Medicine

## 2020-08-16 VITALS — BP 120/80 | HR 80 | Temp 98.0°F | Wt 189.0 lb

## 2020-08-16 DIAGNOSIS — I10 Essential (primary) hypertension: Secondary | ICD-10-CM | POA: Diagnosis not present

## 2020-08-16 DIAGNOSIS — I63511 Cerebral infarction due to unspecified occlusion or stenosis of right middle cerebral artery: Secondary | ICD-10-CM | POA: Diagnosis not present

## 2020-08-16 DIAGNOSIS — E119 Type 2 diabetes mellitus without complications: Secondary | ICD-10-CM

## 2020-08-16 DIAGNOSIS — E7800 Pure hypercholesterolemia, unspecified: Secondary | ICD-10-CM | POA: Diagnosis not present

## 2020-08-16 DIAGNOSIS — G8194 Hemiplegia, unspecified affecting left nondominant side: Secondary | ICD-10-CM | POA: Diagnosis not present

## 2020-08-16 LAB — POCT GLYCOSYLATED HEMOGLOBIN (HGB A1C): Hemoglobin A1C: 6.2 % — AB (ref 4.0–5.6)

## 2020-08-16 MED ORDER — BACLOFEN 10 MG PO TABS: ORAL_TABLET | ORAL | 0 refills | Status: DC

## 2020-08-16 NOTE — Progress Notes (Signed)
Established Patient Office Visit     This visit occurred during the SARS-CoV-2 public health emergency.  Safety protocols were in place, including screening questions prior to the visit, additional usage of staff PPE, and extensive cleaning of exam room while observing appropriate contact time as indicated for disinfecting solutions.    CC/Reason for Visit: 91-monthfollow-up chronic medical conditions  HPI: Rebecca Lamiais a 55y.o. female who is coming in today for the above mentioned reasons. Past Medical History is significant for: right MCA CVA in 2016 with significant residual left spastic hemiplegia managed by Dr. KLetta Patewith Botox injections. She also has well-controlled hypertension, hyperlipidemia and diabetes.She also has a history of asthma that has been well controlled with as needed albuterol.  She has had no acute issues to report, no new complaints.  She is going to have dental work and brings in a medical clearance form today.   Past Medical/Surgical History: Past Medical History:  Diagnosis Date  . Anemia   . ASTHMA 12/10/2009  . Asthma   . Cataract   . CEREBROVASCULAR ACCIDENT, HX OF 12/10/2009  . Complication of anesthesia    "just can't get me up" (06/09/2013)  . Controlled type 2 diabetes mellitus without complication, without long-term current use of insulin (HCC)    no meds  . [place]   with L residual weakness/hemiparesis   . GERD (gastroesophageal reflux disease)   . HYPERLIPIDEMIA 12/10/2009  . HYPERTENSION 12/10/2009  . PFO (patent foramen ovale)   . Stroke ([facility] 2008   denies residual on 06/09/2013  . Tendonitis of wrist, right     Past Surgical History:  Procedure Laterality Date  . COLONOSCOPY    . COSMETIC SURGERY    . OVARIAN CYST REMOVAL  1990's  . UPPER GASTROINTESTINAL ENDOSCOPY      Social History:  reports that she has never smoked. She has never used smokeless tobacco. She  reports that she does not drink alcohol and does not use drugs.  Allergies: Allergies  Allergen Reactions  . Bactrim [Sulfamethoxazole-Trimethoprim] Nausea Only    Nausea   . Penicillins Rash    Family History:  Family History  Problem Relation Age of Onset  . Multiple myeloma Mother   . Breast cancer Mother   . Diabetes Mother   . Diabetes Father   . Breast cancer Maternal Aunt   . Stomach cancer Maternal Aunt   . Liver cancer Maternal Aunt   . Prostate cancer Maternal Uncle   . Colon cancer Neg Hx   . Esophageal cancer Neg Hx   . Rectal cancer Neg Hx      Current Outpatient Medications:  .  acetaminophen (TYLENOL) 325 MG tablet, Take 2 tablets (650 mg total) by mouth every 6 (six) hours as needed., Disp: 20 tablet, Rfl: 0 .  albuterol (VENTOLIN HFA) 108 (90 Base) MCG/ACT inhaler, INHALE 2 PUFFS BY MOUTH EVERY 6 HOURS AS NEEDED, Disp: 9 g, Rfl: 2 .  amLODipine (NORVASC) 10 MG tablet, TAKE 1 TABLET BY MOUTH ONCE DAILY(MUST HAVE OFFICE VISIT FOR REFILLS), Disp: 90 tablet, Rfl: 1 .  atorvastatin (LIPITOR) 40 MG tablet, Take 1 tablet by mouth once daily, Disp: 90 tablet, Rfl: 0 .  carvedilol (COREG) 3.125 MG tablet, Take 1 tablet (3.125 mg total) by mouth 2 (two) times daily with a meal., Disp: 180 tablet, Rfl: 1 .  clopidogrel (PLAVIX) 75 MG tablet, Take 1 tablet (75 mg  total) by mouth daily., Disp: 30 tablet, Rfl: 6 .  diphenhydrAMINE (BENADRYL) 25 MG tablet, Take 50 mg by mouth at bedtime as needed. , Disp: , Rfl:  .  losartan (COZAAR) 50 MG tablet, Take 1 tablet (50 mg total) by mouth 2 (two) times daily., Disp: 60 tablet, Rfl: 3 .  Multiple Vitamin (MULTIVITAMIN WITH MINERALS) TABS tablet, Take 1 tablet by mouth daily., Disp: , Rfl:  .  pantoprazole (PROTONIX) 40 MG tablet, TAKE 1 TABLET BY MOUTH AS NEEDED, Disp: 90 tablet, Rfl: 1 .  polyethylene glycol (MIRALAX / GLYCOLAX) packet, Take 17 g by mouth daily. (Patient taking differently: Take 17 g by mouth daily as needed.),  Disp: 30 each, Rfl: 1 .  baclofen (LIORESAL) 10 MG tablet, TAKE 1 TABLET BY MOUTH TWICE DAILY AS NEEDED FOR MUSCLE SPASM, Disp: 60 tablet, Rfl: 0  Review of Systems:  Constitutional: Denies fever, chills, diaphoresis, appetite change and fatigue.  HEENT: Denies photophobia, eye pain, redness, hearing loss, ear pain, congestion, sore throat, rhinorrhea, sneezing, mouth sores, trouble swallowing, neck pain, neck stiffness and tinnitus.   Respiratory: Denies SOB, DOE, cough, chest tightness,  and wheezing.   Cardiovascular: Denies chest pain, palpitations and leg swelling.  Gastrointestinal: Denies nausea, vomiting, abdominal pain, diarrhea, constipation, blood in stool and abdominal distention.  Genitourinary: Denies dysuria, urgency, frequency, hematuria, flank pain and difficulty urinating.  Endocrine: Denies: hot or cold intolerance, sweats, changes in hair or nails, polyuria, polydipsia. Musculoskeletal: Denies myalgias, back pain, joint swelling, arthralgias and gait problem.  Skin: Denies pallor, rash and wound.  Neurological: Denies dizziness, seizures, syncope, weakness, light-headedness, numbness and headaches.  Hematological: Denies adenopathy. Easy bruising, personal or family bleeding history  Psychiatric/Behavioral: Denies suicidal ideation, mood changes, confusion, nervousness, sleep disturbance and agitation    Physical Exam: Vitals:   08/16/20 0928  BP: 120/80  Pulse: 80  Temp: 98 F (36.7 C)  TempSrc: Oral  SpO2: 98%  Weight: 189 lb (85.7 kg)    Body mass index is 30.51 kg/m.   Constitutional: NAD, calm, comfortable, ambulates with a cane Eyes: PERRL, lids and conjunctivae normal, wears corrective lenses ENMT: Mucous membranes are moist. Respiratory: clear to auscultation bilaterally, no wheezing, no crackles. Normal respiratory effort. No accessory muscle use.  Cardiovascular: Regular rate and rhythm, no murmurs / rubs / gallops. No extremity edema.   Psychiatric: Normal judgment and insight. Alert and oriented x 3. Normal mood.    Impression and Plan:  Controlled type 2 diabetes mellitus without complication, without long-term current use of insulin (HCC)  -A1c continues to demonstrate excellent glycemic control at 6.5 today  Pure hypercholesterolemia -LDL at goal at 46 as of April 2021.  Essential hypertension -Well-controlled.  Right middle cerebral artery stroke (HCC) Left hemiparesis (HCC)  -Noted, followed by Dr. Letta Pate, requesting baclofen refills today.   Patient Instructions  -Nice seeing you today!!  -Schedule follow up in 3 months.     Lelon Frohlich, MD [facility]

## 2020-08-16 NOTE — Patient Instructions (Signed)
-  Nice seeing you today!!  -Schedule follow up in 3 months. 

## 2020-08-22 ENCOUNTER — Other Ambulatory Visit: Payer: Self-pay | Admitting: Internal Medicine

## 2020-08-22 DIAGNOSIS — E119 Type 2 diabetes mellitus without complications: Secondary | ICD-10-CM

## 2020-08-22 DIAGNOSIS — I63511 Cerebral infarction due to unspecified occlusion or stenosis of right middle cerebral artery: Secondary | ICD-10-CM

## 2020-09-02 ENCOUNTER — Other Ambulatory Visit: Payer: Self-pay | Admitting: Internal Medicine

## 2020-09-02 DIAGNOSIS — I10 Essential (primary) hypertension: Secondary | ICD-10-CM

## 2020-09-03 ENCOUNTER — Other Ambulatory Visit: Payer: Self-pay | Admitting: Internal Medicine

## 2020-09-03 DIAGNOSIS — I10 Essential (primary) hypertension: Secondary | ICD-10-CM

## 2020-09-12 ENCOUNTER — Ambulatory Visit: Payer: Medicare HMO | Admitting: Internal Medicine

## 2020-09-20 ENCOUNTER — Ambulatory Visit: Payer: Medicare HMO | Admitting: Internal Medicine

## 2020-09-22 ENCOUNTER — Other Ambulatory Visit: Payer: Self-pay | Admitting: Internal Medicine

## 2020-09-22 DIAGNOSIS — I63511 Cerebral infarction due to unspecified occlusion or stenosis of right middle cerebral artery: Secondary | ICD-10-CM

## 2020-10-11 ENCOUNTER — Encounter: Payer: Self-pay | Admitting: Physical Medicine & Rehabilitation

## 2020-10-11 ENCOUNTER — Other Ambulatory Visit: Payer: Self-pay

## 2020-10-11 ENCOUNTER — Encounter: Payer: Medicare HMO | Attending: Physical Medicine & Rehabilitation | Admitting: Physical Medicine & Rehabilitation

## 2020-10-11 VITALS — BP 132/85 | HR 90 | Temp 98.1°F | Ht 66.0 in | Wt 191.6 lb

## 2020-10-11 DIAGNOSIS — G8112 Spastic hemiplegia affecting left dominant side: Secondary | ICD-10-CM | POA: Diagnosis not present

## 2020-10-11 DIAGNOSIS — G8110 Spastic hemiplegia affecting unspecified side: Secondary | ICD-10-CM | POA: Diagnosis not present

## 2020-10-11 NOTE — Patient Instructions (Signed)

## 2020-10-11 NOTE — Progress Notes (Signed)
Botox Injection for spasticity using needle EMG guidance  Dilution: 50 Units/ml Indication: Severe spasticity which interferes with ADL,mobility and/or  hygiene and is unresponsive to medication management and other conservative care Informed consent was obtained after describing risks and benefits of the procedure with the patient. This includes bleeding, bruising, infection, excessive weakness, or medication side effects. A REMS form is on file and signed. Needle: 25g 2" needle electrode Number of units per muscle  LEFT Posterior tibialis 75 Gastroc medial 75   FHL 50 FDL 75 EHL 25  All injections were done after obtaining appropriate EMG activity and after negative drawback for blood. The patient tolerated the procedure well. Post procedure instructions were given. A followup appointment was made.   Based on EMG activity plan to reduce dose to   LEFT Post tib 75U FDL 75U EHL 25U Soleus 25U

## 2020-10-27 ENCOUNTER — Other Ambulatory Visit: Payer: Self-pay | Admitting: Internal Medicine

## 2020-10-27 DIAGNOSIS — I10 Essential (primary) hypertension: Secondary | ICD-10-CM

## 2020-10-28 ENCOUNTER — Other Ambulatory Visit: Payer: Self-pay | Admitting: Internal Medicine

## 2020-10-28 DIAGNOSIS — J4530 Mild persistent asthma, uncomplicated: Secondary | ICD-10-CM

## 2020-11-13 ENCOUNTER — Other Ambulatory Visit: Payer: Self-pay

## 2020-11-14 ENCOUNTER — Ambulatory Visit (INDEPENDENT_AMBULATORY_CARE_PROVIDER_SITE_OTHER): Payer: Medicare HMO | Admitting: Internal Medicine

## 2020-11-14 ENCOUNTER — Encounter: Payer: Self-pay | Admitting: *Deleted

## 2020-11-14 VITALS — BP 130/80 | HR 66 | Temp 97.7°F | Wt 191.0 lb

## 2020-11-14 DIAGNOSIS — N3000 Acute cystitis without hematuria: Secondary | ICD-10-CM

## 2020-11-14 DIAGNOSIS — E119 Type 2 diabetes mellitus without complications: Secondary | ICD-10-CM | POA: Diagnosis not present

## 2020-11-14 DIAGNOSIS — E7800 Pure hypercholesterolemia, unspecified: Secondary | ICD-10-CM | POA: Diagnosis not present

## 2020-11-14 DIAGNOSIS — I10 Essential (primary) hypertension: Secondary | ICD-10-CM

## 2020-11-14 DIAGNOSIS — I63511 Cerebral infarction due to unspecified occlusion or stenosis of right middle cerebral artery: Secondary | ICD-10-CM

## 2020-11-14 DIAGNOSIS — G8194 Hemiplegia, unspecified affecting left nondominant side: Secondary | ICD-10-CM | POA: Diagnosis not present

## 2020-11-14 DIAGNOSIS — K219 Gastro-esophageal reflux disease without esophagitis: Secondary | ICD-10-CM

## 2020-11-14 LAB — POCT URINALYSIS DIPSTICK
Bilirubin, UA: NEGATIVE
Blood, UA: NEGATIVE
Glucose, UA: NEGATIVE
Ketones, UA: NEGATIVE
Nitrite, UA: NEGATIVE
Protein, UA: NEGATIVE
Spec Grav, UA: 1.02 (ref 1.010–1.025)
Urobilinogen, UA: 0.2 E.U./dL
pH, UA: 6 (ref 5.0–8.0)

## 2020-11-14 LAB — URINALYSIS, ROUTINE W REFLEX MICROSCOPIC
Bilirubin Urine: NEGATIVE
Hgb urine dipstick: NEGATIVE
Ketones, ur: NEGATIVE
Nitrite: NEGATIVE
RBC / HPF: NONE SEEN (ref 0–?)
Specific Gravity, Urine: 1.015 (ref 1.000–1.030)
Total Protein, Urine: NEGATIVE
Urine Glucose: NEGATIVE
Urobilinogen, UA: 0.2 (ref 0.0–1.0)
pH: 6 (ref 5.0–8.0)

## 2020-11-14 LAB — POCT GLYCOSYLATED HEMOGLOBIN (HGB A1C): Hemoglobin A1C: 6.4 % — AB (ref 4.0–5.6)

## 2020-11-14 MED ORDER — CIPROFLOXACIN HCL 500 MG PO TABS
500.0000 mg | ORAL_TABLET | Freq: Two times a day (BID) | ORAL | 0 refills | Status: AC
Start: 2020-11-14 — End: 2020-11-21

## 2020-11-14 NOTE — Patient Instructions (Signed)
-Nice seeing you today!!  -Start cipro 500 mg twice daily for 7 days.  -Schedule follow up in 3 months for your physical. Please come in fasting that day.   Urinary Tract Infection, Adult A urinary tract infection (UTI) is an infection of any part of the urinary tract. The urinary tract includes:  The kidneys.  The ureters.  The bladder.  The urethra. These organs make, store, and get rid of pee (urine) in the body. What are the causes? This infection is caused by germs (bacteria) in your genital area. These germs grow and cause swelling (inflammation) of your urinary tract. What increases the risk? The following factors may make you more likely to develop this condition:  Using a small, thin tube (catheter) to drain pee.  Not being able to control when you pee or poop (incontinence).  Being female. If you are female, these things can increase the risk: ? Using these methods to prevent pregnancy:  A medicine that kills sperm (spermicide).  A device that blocks sperm (diaphragm). ? Having low levels of a female hormone (estrogen). ? Being pregnant. You are more likely to develop this condition if:  You have genes that add to your risk.  You are sexually active.  You take antibiotic medicines.  You have trouble peeing because of: ? A prostate that is bigger than normal, if you are female. ? A blockage in the part of your body that drains pee from the bladder. ? A kidney stone. ? A nerve condition that affects your bladder. ? Not getting enough to drink. ? Not peeing often enough.  You have other conditions, such as: ? Diabetes. ? A weak disease-fighting system (immune system). ? Sickle cell disease. ? Gout. ? Injury of the spine. What are the signs or symptoms? Symptoms of this condition include:  Needing to pee right away.  Peeing small amounts often.  Pain or burning when peeing.  Blood in the pee.  Pee that smells bad or not like normal.  Trouble  peeing.  Pee that is cloudy.  Fluid coming from the vagina, if you are female.  Pain in the belly or lower back. Other symptoms include:  Vomiting.  Not feeling hungry.  Feeling mixed up (confused). This may be the first symptom in older adults.  Being tired and grouchy (irritable).  A fever.  Watery poop (diarrhea). How is this treated?  Taking antibiotic medicine.  Taking other medicines.  Drinking enough water. In some cases, you may need to see a specialist. Follow these instructions at home: Medicines  Take over-the-counter and prescription medicines only as told by your doctor.  If you were prescribed an antibiotic medicine, take it as told by your doctor. Do not stop taking it even if you start to feel better. General instructions  Make sure you: ? Pee until your bladder is empty. ? Do not hold pee for a long time. ? Empty your bladder after sex. ? Wipe from front to back after peeing or pooping if you are a female. Use each tissue one time when you wipe.  Drink enough fluid to keep your pee pale yellow.  Keep all follow-up visits.   Contact a doctor if:  You do not get better after 1-2 days.  Your symptoms go away and then come back. Get help right away if:  You have very bad back pain.  You have very bad pain in your lower belly.  You have a fever.  You have chills.  You feeling like you will vomit or you vomit. Summary  A urinary tract infection (UTI) is an infection of any part of the urinary tract.  This condition is caused by germs in your genital area.  There are many risk factors for a UTI.  Treatment includes antibiotic medicines.  Drink enough fluid to keep your pee pale yellow. This information is not intended to replace advice given to you by your health care provider. Make sure you discuss any questions you have with your health care provider. Document Revised: 03/09/2020 Document Reviewed: 03/09/2020 Elsevier Patient  Education  [place].

## 2020-11-14 NOTE — Progress Notes (Signed)
Established Patient Office Visit     This visit occurred during the SARS-CoV-2 public health emergency.  Safety protocols were in place, including screening questions prior to the visit, additional usage of staff PPE, and extensive cleaning of exam room while observing appropriate contact time as indicated for disinfecting solutions.    CC/Reason for Visit: 26-monthfollow-up chronic medical conditions  HPI: Rebecca Yoder a 55y.o. female who is coming in today for the above mentioned reasons. Past Medical History is significant for: right MCA CVA in 2016 with significant residual left spastic hemiplegia managed by Dr. KLetta Patewith Botox injections. She also has well-controlled hypertension, hyperlipidemia, well-controlled GERD and diabetes.She also has a history of asthma that has been well controlled with as needed albuterol.   She has noticed a cloudy urine with a strong odor for the past week or so.  She denies dysuria, hesitancy, frequency.   Past Medical/Surgical History: Past Medical History:  Diagnosis Date  . Anemia   . ASTHMA 12/10/2009  . Asthma   . Cataract   . CEREBROVASCULAR ACCIDENT, HX OF 12/10/2009  . Complication of anesthesia    "just can't get me up" (06/09/2013)  . Controlled type 2 diabetes mellitus without complication, without long-term current use of insulin (HCC)    no meds  . [place] of right basal ganglia [place] 06[place]   with L residual weakness/hemiparesis   . GERD (gastroesophageal reflux disease)   . HYPERLIPIDEMIA 12/10/2009  . HYPERTENSION 12/10/2009  . PFO (patent foramen ovale)   . Stroke ([facility] 2008   denies residual on 06/09/2013  . Tendonitis of wrist, right     Past Surgical History:  Procedure Laterality Date  . COLONOSCOPY    . COSMETIC SURGERY    . OVARIAN CYST REMOVAL  1990's  . UPPER GASTROINTESTINAL ENDOSCOPY      Social History:  reports that she has never smoked. She has never used smokeless tobacco. She  reports that she does not drink alcohol and does not use drugs.  Allergies: Allergies  Allergen Reactions  . Bactrim [Sulfamethoxazole-Trimethoprim] Nausea Only    Nausea   . Penicillins Rash    Family History:  Family History  Problem Relation Age of Onset  . Multiple myeloma Mother   . Breast cancer Mother   . Diabetes Mother   . Diabetes Father   . Breast cancer Maternal Aunt   . Stomach cancer Maternal Aunt   . Liver cancer Maternal Aunt   . Prostate cancer Maternal Uncle   . Colon cancer Neg Hx   . Esophageal cancer Neg Hx   . Rectal cancer Neg Hx      Current Outpatient Medications:  .  acetaminophen (TYLENOL) 325 MG tablet, Take 2 tablets (650 mg total) by mouth every 6 (six) hours as needed., Disp: 20 tablet, Rfl: 0 .  albuterol (VENTOLIN HFA) 108 (90 Base) MCG/ACT inhaler, INHALE 2 PUFFS BY MOUTH EVERY 6 HOURS AS NEEDED, Disp: 9 g, Rfl: 0 .  amLODipine (NORVASC) 10 MG tablet, TAKE 1 TABLET BY MOUTH ONCE DAILY(MUST HAVE OFFICE VISIT FOR REFILLS), Disp: 90 tablet, Rfl: 0 .  atorvastatin (LIPITOR) 40 MG tablet, Take 1 tablet by mouth once daily, Disp: 90 tablet, Rfl: 0 .  baclofen (LIORESAL) 10 MG tablet, TAKE 1 TABLET BY MOUTH TWICE DAILY AS NEEDED FOR MUSCLE SPASM, Disp: 60 tablet, Rfl: 0 .  carvedilol (COREG) 3.125 MG tablet, TAKE 1 TABLET BY MOUTH TWICE DAILY WITH MEALS, Disp:  180 tablet, Rfl: 0 .  ciprofloxacin (CIPRO) 500 MG tablet, Take 1 tablet (500 mg total) by mouth 2 (two) times daily for 7 days., Disp: 14 tablet, Rfl: 0 .  clopidogrel (PLAVIX) 75 MG tablet, Take 1 tablet by mouth once daily, Disp: 90 tablet, Rfl: 1 .  diphenhydrAMINE (BENADRYL) 25 MG tablet, Take 50 mg by mouth at bedtime as needed. , Disp: , Rfl:  .  losartan (COZAAR) 50 MG tablet, Take 1 tablet (50 mg total) by mouth 2 (two) times daily., Disp: 60 tablet, Rfl: 3 .  Multiple Vitamin (MULTIVITAMIN WITH MINERALS) TABS tablet, Take 1 tablet by mouth daily., Disp: , Rfl:  .  pantoprazole  (PROTONIX) 40 MG tablet, TAKE 1 TABLET BY MOUTH AS NEEDED, Disp: 90 tablet, Rfl: 1 .  polyethylene glycol (MIRALAX / GLYCOLAX) packet, Take 17 g by mouth daily. (Patient taking differently: Take 17 g by mouth daily as needed.), Disp: 30 each, Rfl: 1  Review of Systems:  Constitutional: Denies fever, chills, diaphoresis, appetite change and fatigue.  HEENT: Denies photophobia, eye pain, redness, hearing loss, ear pain, congestion, sore throat, rhinorrhea, sneezing, mouth sores, trouble swallowing, neck pain, neck stiffness and tinnitus.   Respiratory: Denies SOB, DOE, cough, chest tightness,  and wheezing.   Cardiovascular: Denies chest pain, palpitations and leg swelling.  Gastrointestinal: Denies nausea, vomiting, abdominal pain, diarrhea, constipation, blood in stool and abdominal distention.  Genitourinary: Denies dysuria, urgency, frequency, hematuria, flank pain and difficulty urinating.  Endocrine: Denies: hot or cold intolerance, sweats, changes in hair or nails, polyuria, polydipsia. Musculoskeletal: Denies myalgias, back pain, joint swelling, arthralgias and gait problem.  Skin: Denies pallor, rash and wound.  Neurological: Denies dizziness, seizures, syncope, weakness, light-headedness, numbness and headaches.  Hematological: Denies adenopathy. Easy bruising, personal or family bleeding history  Psychiatric/Behavioral: Denies suicidal ideation, mood changes, confusion, nervousness, sleep disturbance and agitation    Physical Exam: Vitals:   11/14/20 1002  BP: 130/80  Pulse: 66  Temp: 97.7 F (36.5 C)  TempSrc: Oral  SpO2: 96%  Weight: 191 lb (86.6 kg)    Body mass index is 30.83 kg/m.   Constitutional: NAD, calm, comfortable, ambulates with a cane Eyes: PERRL, lids and conjunctivae normal, wears corrective lenses ENMT: Mucous membranes are moist. Posterior pharynx clear of any exudate or lesions. Respiratory: clear to auscultation bilaterally, no wheezing, no crackles.  Normal respiratory effort. No accessory muscle use.  Cardiovascular: Regular rate and rhythm, no murmurs / rubs / gallops. No extremity edema.  Psychiatric: Normal judgment and insight. Alert and oriented x 3. Normal mood.    Impression and Plan:  Acute cystitis without hematuria  - Plan: POCT urinalysis dipstick, Urine Culture, Urinalysis, ciprofloxacin (CIPRO) 500 MG tablet -In office urine dipstick with leukocytes, no blood. -She has severe nausea with Bactrim, have prescribed Cipro 500 mg twice daily for 7 days.  Urine culture has been sent.  Controlled type 2 diabetes mellitus without complication, without long-term current use of insulin (HCC) -A1c today is 6.4.  Right middle cerebral artery stroke (HCC) Left hemiparesis (HCC) -Noted, followed by Dr. Letta Pate.  Essential hypertension -Well-controlled.  Pure hypercholesterolemia -Well-controlled with the most recent LDL of 46 in April 2021.  Gastroesophageal reflux disease, unspecified whether esophagitis present -Well-controlled on daily PPI therapy.   Patient Instructions   -Nice seeing you today!!  -Start cipro 500 mg twice daily for 7 days.  -Schedule follow up in 3 months for your physical. Please come in fasting that day.   Urinary  Tract Infection, Adult A urinary tract infection (UTI) is an infection of any part of the urinary tract. The urinary tract includes:  The kidneys.  The ureters.  The bladder.  The urethra. These organs make, store, and get rid of pee (urine) in the body. What are the causes? This infection is caused by germs (bacteria) in your genital area. These germs grow and cause swelling (inflammation) of your urinary tract. What increases the risk? The following factors may make you more likely to develop this condition:  Using a small, thin tube (catheter) to drain pee.  Not being able to control when you pee or poop (incontinence).  Being female. If you are female, these things  can increase the risk: ? Using these methods to prevent pregnancy:  A medicine that kills sperm (spermicide).  A device that blocks sperm (diaphragm). ? Having low levels of a female hormone (estrogen). ? Being pregnant. You are more likely to develop this condition if:  You have genes that add to your risk.  You are sexually active.  You take antibiotic medicines.  You have trouble peeing because of: ? A prostate that is bigger than normal, if you are female. ? A blockage in the part of your body that drains pee from the bladder. ? A kidney stone. ? A nerve condition that affects your bladder. ? Not getting enough to drink. ? Not peeing often enough.  You have other conditions, such as: ? Diabetes. ? A weak disease-fighting system (immune system). ? Sickle cell disease. ? Gout. ? Injury of the spine. What are the signs or symptoms? Symptoms of this condition include:  Needing to pee right away.  Peeing small amounts often.  Pain or burning when peeing.  Blood in the pee.  Pee that smells bad or not like normal.  Trouble peeing.  Pee that is cloudy.  Fluid coming from the vagina, if you are female.  Pain in the belly or lower back. Other symptoms include:  Vomiting.  Not feeling hungry.  Feeling mixed up (confused). This may be the first symptom in older adults.  Being tired and grouchy (irritable).  A fever.  Watery poop (diarrhea). How is this treated?  Taking antibiotic medicine.  Taking other medicines.  Drinking enough water. In some cases, you may need to see a specialist. Follow these instructions at home: Medicines  Take over-the-counter and prescription medicines only as told by your doctor.  If you were prescribed an antibiotic medicine, take it as told by your doctor. Do not stop taking it even if you start to feel better. General instructions  Make sure you: ? Pee until your bladder is empty. ? Do not hold pee for a long  time. ? Empty your bladder after sex. ? Wipe from front to back after peeing or pooping if you are a female. Use each tissue one time when you wipe.  Drink enough fluid to keep your pee pale yellow.  Keep all follow-up visits.   Contact a doctor if:  You do not get better after 1-2 days.  Your symptoms go away and then come back. Get help right away if:  You have very bad back pain.  You have very bad pain in your lower belly.  You have a fever.  You have chills.  You feeling like you will vomit or you vomit. Summary  A urinary tract infection (UTI) is an infection of any part of the urinary tract.  This condition is caused by  germs in your genital area.  There are many risk factors for a UTI.  Treatment includes antibiotic medicines.  Drink enough fluid to keep your pee pale yellow. This information is not intended to replace advice given to you by your health care provider. Make sure you discuss any questions you have with your health care provider. Document Revised: 03/09/2020 Document Reviewed: 03/09/2020 Elsevier Patient Education  2021 [place], MD [facility]

## 2020-11-14 NOTE — Addendum Note (Signed)
Addended by: Denna Haggard K on: 11/14/2020 11:08 AM   Modules accepted: Orders

## 2020-11-16 LAB — URINE CULTURE
MICRO NUMBER:: 11738131
SPECIMEN QUALITY:: ADEQUATE

## 2020-11-20 DIAGNOSIS — R69 Illness, unspecified: Secondary | ICD-10-CM | POA: Diagnosis not present

## 2020-11-24 ENCOUNTER — Other Ambulatory Visit: Payer: Self-pay | Admitting: Internal Medicine

## 2020-11-24 DIAGNOSIS — E119 Type 2 diabetes mellitus without complications: Secondary | ICD-10-CM

## 2020-11-26 ENCOUNTER — Other Ambulatory Visit: Payer: Self-pay | Admitting: Internal Medicine

## 2020-11-26 DIAGNOSIS — G8194 Hemiplegia, unspecified affecting left nondominant side: Secondary | ICD-10-CM

## 2020-12-08 ENCOUNTER — Other Ambulatory Visit: Payer: Self-pay | Admitting: Internal Medicine

## 2020-12-08 DIAGNOSIS — I10 Essential (primary) hypertension: Secondary | ICD-10-CM

## 2020-12-08 DIAGNOSIS — J4530 Mild persistent asthma, uncomplicated: Secondary | ICD-10-CM

## 2020-12-11 ENCOUNTER — Other Ambulatory Visit: Payer: Self-pay | Admitting: Internal Medicine

## 2020-12-11 DIAGNOSIS — K219 Gastro-esophageal reflux disease without esophagitis: Secondary | ICD-10-CM

## 2020-12-31 ENCOUNTER — Other Ambulatory Visit: Payer: Self-pay | Admitting: Internal Medicine

## 2020-12-31 DIAGNOSIS — J4530 Mild persistent asthma, uncomplicated: Secondary | ICD-10-CM

## 2021-01-15 ENCOUNTER — Encounter: Payer: Self-pay | Admitting: Physical Medicine & Rehabilitation

## 2021-01-15 ENCOUNTER — Other Ambulatory Visit: Payer: Self-pay

## 2021-01-15 ENCOUNTER — Encounter: Payer: Medicare HMO | Attending: Physical Medicine & Rehabilitation | Admitting: Physical Medicine & Rehabilitation

## 2021-01-15 VITALS — BP 127/83 | HR 88 | Temp 98.6°F | Ht 66.0 in | Wt 187.8 lb

## 2021-01-15 DIAGNOSIS — G8110 Spastic hemiplegia affecting unspecified side: Secondary | ICD-10-CM | POA: Diagnosis not present

## 2021-01-15 NOTE — Patient Instructions (Signed)

## 2021-01-15 NOTE — Progress Notes (Signed)
Botox Injection for spasticity using needle EMG guidance  Dilution: 50 Units/ml Indication: Severe spasticity which interferes with ADL,mobility and/or  hygiene and is unresponsive to medication management and other conservative care Informed consent was obtained after describing risks and benefits of the procedure with the patient. This includes bleeding, bruising, infection, excessive weakness, or medication side effects. A REMS form is on file and signed. Needle: 38mm 25g needle electrode Number of units per muscle  LEFT Post tib 75U FDL 75U EHL 25U Soleus 25U  All injections were done after obtaining appropriate EMG activity and after negative drawback for blood. The patient tolerated the procedure well. Post procedure instructions were given. A followup appointment was made.

## 2021-01-29 ENCOUNTER — Other Ambulatory Visit: Payer: Self-pay | Admitting: Internal Medicine

## 2021-01-29 DIAGNOSIS — I10 Essential (primary) hypertension: Secondary | ICD-10-CM

## 2021-02-18 ENCOUNTER — Other Ambulatory Visit: Payer: Self-pay

## 2021-02-19 ENCOUNTER — Encounter: Payer: Self-pay | Admitting: Internal Medicine

## 2021-02-19 ENCOUNTER — Ambulatory Visit (INDEPENDENT_AMBULATORY_CARE_PROVIDER_SITE_OTHER): Payer: Medicare HMO | Admitting: Internal Medicine

## 2021-02-19 VITALS — BP 110/80 | HR 88 | Temp 97.8°F | Ht 66.0 in | Wt 184.3 lb

## 2021-02-19 DIAGNOSIS — J4540 Moderate persistent asthma, uncomplicated: Secondary | ICD-10-CM

## 2021-02-19 DIAGNOSIS — Z1231 Encounter for screening mammogram for malignant neoplasm of breast: Secondary | ICD-10-CM

## 2021-02-19 DIAGNOSIS — K219 Gastro-esophageal reflux disease without esophagitis: Secondary | ICD-10-CM

## 2021-02-19 DIAGNOSIS — E1149 Type 2 diabetes mellitus with other diabetic neurological complication: Secondary | ICD-10-CM | POA: Diagnosis not present

## 2021-02-19 DIAGNOSIS — E7800 Pure hypercholesterolemia, unspecified: Secondary | ICD-10-CM | POA: Diagnosis not present

## 2021-02-19 DIAGNOSIS — G8110 Spastic hemiplegia affecting unspecified side: Secondary | ICD-10-CM | POA: Diagnosis not present

## 2021-02-19 DIAGNOSIS — J4530 Mild persistent asthma, uncomplicated: Secondary | ICD-10-CM

## 2021-02-19 DIAGNOSIS — Z23 Encounter for immunization: Secondary | ICD-10-CM | POA: Diagnosis not present

## 2021-02-19 DIAGNOSIS — Z0000 Encounter for general adult medical examination without abnormal findings: Secondary | ICD-10-CM | POA: Diagnosis not present

## 2021-02-19 DIAGNOSIS — I63511 Cerebral infarction due to unspecified occlusion or stenosis of right middle cerebral artery: Secondary | ICD-10-CM

## 2021-02-19 LAB — COMPREHENSIVE METABOLIC PANEL
ALT: 23 U/L (ref 0–35)
AST: 18 U/L (ref 0–37)
Albumin: 4.7 g/dL (ref 3.5–5.2)
Alkaline Phosphatase: 94 U/L (ref 39–117)
BUN: 13 mg/dL (ref 6–23)
CO2: 28 mEq/L (ref 19–32)
Calcium: 10 mg/dL (ref 8.4–10.5)
Chloride: 101 mEq/L (ref 96–112)
Creatinine, Ser: 0.83 mg/dL (ref 0.40–1.20)
GFR: 80.74 mL/min (ref >60.00)
Glucose, Bld: 94 mg/dL (ref 70–99)
Potassium: 4.3 mEq/L (ref 3.5–5.1)
Sodium: 139 mEq/L (ref 135–145)
Total Bilirubin: 0.4 mg/dL (ref 0.2–1.2)
Total Protein: 7.8 g/dL (ref 6.0–8.3)

## 2021-02-19 LAB — CBC WITH DIFFERENTIAL/PLATELET
Basophils Absolute: 0 10*3/uL (ref 0.0–0.1)
Basophils Relative: 0.5 % (ref 0.0–3.0)
Eosinophils Absolute: 0.2 10*3/uL (ref 0.0–0.7)
Eosinophils Relative: 4.8 % (ref 0.0–5.0)
HCT: 38.5 % (ref 36.0–46.0)
Hemoglobin: 12.7 g/dL (ref 12.0–15.0)
Lymphocytes Relative: 44 % (ref 12.0–46.0)
Lymphs Abs: 2.1 10*3/uL (ref 0.7–4.0)
MCHC: 33.1 g/dL (ref 30.0–36.0)
MCV: 86.8 fl (ref 78.0–100.0)
Monocytes Absolute: 0.7 10*3/uL (ref 0.1–1.0)
Monocytes Relative: 13.9 % — ABNORMAL HIGH (ref 3.0–12.0)
Neutro Abs: 1.8 10*3/uL (ref 1.4–7.7)
Neutrophils Relative %: 36.8 % — ABNORMAL LOW (ref 43.0–77.0)
Platelets: 333 10*3/uL (ref 150.0–400.0)
RBC: 4.43 Mil/uL (ref 3.87–5.11)
RDW: 15.5 % (ref 11.5–15.5)
WBC: 4.8 10*3/uL (ref 4.0–10.5)

## 2021-02-19 LAB — VITAMIN B12: Vitamin B-12: >1550 pg/mL — ABNORMAL HIGH (ref 211–911)

## 2021-02-19 LAB — LIPID PANEL
Cholesterol: 114 mg/dL (ref 0–200)
HDL: 45 mg/dL (ref >39.00)
LDL Cholesterol: 49 mg/dL (ref 0–99)
NonHDL: 69.47
Total CHOL/HDL Ratio: 3
Triglycerides: 103 mg/dL (ref 0.0–149.0)
VLDL: 20.6 mg/dL (ref 0.0–40.0)

## 2021-02-19 LAB — TSH: TSH: 7.44 u[IU]/mL — ABNORMAL HIGH (ref 0.35–5.50)

## 2021-02-19 LAB — HEMOGLOBIN A1C: Hgb A1c MFr Bld: 6.8 % — ABNORMAL HIGH (ref 4.6–6.5)

## 2021-02-19 LAB — VITAMIN D 25 HYDROXY (VIT D DEFICIENCY, FRACTURES): VITD: 43.07 ng/mL (ref 30.00–100.00)

## 2021-02-19 MED ORDER — FLUTICASONE PROPIONATE HFA 110 MCG/ACT IN AERO: 2.0000 | INHALATION_SPRAY | Freq: Two times a day (BID) | RESPIRATORY_TRACT | 12 refills | Status: DC

## 2021-02-19 MED ORDER — ALBUTEROL SULFATE HFA 108 (90 BASE) MCG/ACT IN AERS: 2.0000 | INHALATION_SPRAY | Freq: Four times a day (QID) | RESPIRATORY_TRACT | 1 refills | Status: DC | PRN

## 2021-02-19 NOTE — Patient Instructions (Signed)
-  Nice seeing you today!!  -Lab work today; will notify you once results are available.  -Start Flovent 2 puffs twice daily, try to decrease the frequency of usage of your albuterol inhaler.  -Schedule follow up in 3 months.  -Pneumonia vaccine today.  -Remember to get your shingles and 4th COVID vaccines at the pharmacy.

## 2021-02-19 NOTE — Addendum Note (Signed)
Addended by: Westley Hummer B on: 02/19/2021 01:18 PM   Modules accepted: Orders

## 2021-02-19 NOTE — Progress Notes (Signed)
Established Patient Office Visit     This visit occurred during the SARS-CoV-2 public health emergency.  Safety protocols were in place, including screening questions prior to the visit, additional usage of staff PPE, and extensive cleaning of exam room while observing appropriate contact time as indicated for disinfecting solutions.    CC/Reason for Visit: Subsequent Medicare wellness visit and discuss chronic conditions  HPI: Rebecca Yoder is a 55 y.o. female who is coming in today for the above mentioned reasons. Past Medical History is significant for:  right MCA CVA in 2016 with significant residual left spastic hemiplegia managed by Dr. Letta Pate with Botox injections.  She also has well-controlled hypertension, hyperlipidemia, well-controlled GERD and diabetes.  She has a history of asthma.  Lately she tells me that it has been uncontrolled.  For about 6 weeks she has been using her albuterol inhaler about every 4-6 hours to the point where she has run out early.  She has not sought medical attention for this.  She feels like she has increased shortness of breath and coughing.  She is overdue for her fourth COVID-vaccine, shingles and clinically due to pneumonia vaccine.  She is overdue for mammogram.  She had a colonoscopy in 2021.  She had a normal Pap smear in 2021.  She has routine eye and dental care.  She exercises by biking and doing home physical therapy about 4 days a week.   Past Medical/Surgical History: Past Medical History:  Diagnosis Date   Anemia    ASTHMA 12/10/2009   Asthma    Cataract    CEREBROVASCULAR ACCIDENT, HX OF 02/16/2955   Complication of anesthesia    "just can't get me up" (06/09/2013)   Controlled type 2 diabetes mellitus without complication, without long-term current use of insulin (HCC)    no meds   Embolic stroke of right basal ganglia ([place]) 06/11/2015   with L residual weakness/hemiparesis    GERD (gastroesophageal reflux disease)     HYPERLIPIDEMIA 12/10/2009   HYPERTENSION 12/10/2009   PFO (patent foramen ovale)    Stroke ([place]) 2008   denies residual on 06/09/2013   Tendonitis of wrist, right     Past Surgical History:  Procedure Laterality Date   COLONOSCOPY     COSMETIC SURGERY     OVARIAN CYST REMOVAL  1990's   UPPER GASTROINTESTINAL ENDOSCOPY      Social History:  reports that she has never smoked. She has never used smokeless tobacco. She reports that she does not drink alcohol and does not use drugs.  Allergies: Allergies  Allergen Reactions   Bactrim [Sulfamethoxazole-Trimethoprim] Nausea Only    Nausea    Penicillins Rash    Family History:  Family History  Problem Relation Age of Onset   Multiple myeloma Mother    Breast cancer Mother    Diabetes Mother    Diabetes Father    Breast cancer Maternal Aunt    Stomach cancer Maternal Aunt    Liver cancer Maternal Aunt    Prostate cancer Maternal Uncle    Colon cancer Neg Hx    Esophageal cancer Neg Hx    Rectal cancer Neg Hx      Current Outpatient Medications:    acetaminophen (TYLENOL) 325 MG tablet, Take 2 tablets (650 mg total) by mouth every 6 (six) hours as needed., Disp: 20 tablet, Rfl: 0   amLODipine (NORVASC) 10 MG tablet, Take 1 tablet (10 mg total) by mouth daily., Disp: 90 tablet, Rfl:  1   atorvastatin (LIPITOR) 40 MG tablet, Take 1 tablet by mouth once daily, Disp: 90 tablet, Rfl: 0   baclofen (LIORESAL) 10 MG tablet, TAKE 1 TABLET BY MOUTH TWICE DAILY AS NEEDED FOR MUSCLE SPASM, Disp: 60 tablet, Rfl: 0   carvedilol (COREG) 3.125 MG tablet, TAKE 1 TABLET BY MOUTH TWICE DAILY WITH MEALS, Disp: 180 tablet, Rfl: 0   clopidogrel (PLAVIX) 75 MG tablet, Take 1 tablet by mouth once daily, Disp: 90 tablet, Rfl: 1   diphenhydrAMINE (BENADRYL) 25 MG tablet, Take 50 mg by mouth at bedtime as needed. , Disp: , Rfl:    fluticasone (FLOVENT HFA) 110 MCG/ACT inhaler, Inhale 2 puffs into the lungs in the morning and at bedtime., Disp: 1 each,  Rfl: 12   losartan (COZAAR) 50 MG tablet, Take 1 tablet by mouth twice daily, Disp: 180 tablet, Rfl: 1   Multiple Vitamin (MULTIVITAMIN WITH MINERALS) TABS tablet, Take 1 tablet by mouth daily., Disp: , Rfl:    pantoprazole (PROTONIX) 40 MG tablet, TAKE 1 TABLET BY MOUTH AS NEEDED, Disp: 90 tablet, Rfl: 1   polyethylene glycol (MIRALAX / GLYCOLAX) packet, Take 17 g by mouth daily. (Patient taking differently: Take 17 g by mouth daily as needed.), Disp: 30 each, Rfl: 1   albuterol (VENTOLIN HFA) 108 (90 Base) MCG/ACT inhaler, Inhale 2 puffs into the lungs every 6 (six) hours as needed., Disp: 9 g, Rfl: 1  Review of Systems:  Constitutional: Denies fever, chills, diaphoresis, appetite change and fatigue.  HEENT: Denies photophobia, eye pain, redness, hearing loss, ear pain, congestion, sore throat, rhinorrhea, sneezing, mouth sores, trouble swallowing, neck pain, neck stiffness and tinnitus.   Respiratory: Denies  chest tightness. Cardiovascular: Denies chest pain, palpitations and leg swelling.  Gastrointestinal: Denies nausea, vomiting, abdominal pain, diarrhea, constipation, blood in stool and abdominal distention.  Genitourinary: Denies dysuria, urgency, frequency, hematuria, flank pain and difficulty urinating.  Endocrine: Denies: hot or cold intolerance, sweats, changes in hair or nails, polyuria, polydipsia. Musculoskeletal: Denies myalgias, back pain, joint swelling, arthralgias. Skin: Denies pallor, rash and wound.  Neurological: Denies dizziness, seizures, syncope, weakness, light-headedness, numbness and headaches.  Hematological: Denies adenopathy. Easy bruising, personal or family bleeding history  Psychiatric/Behavioral: Denies suicidal ideation, mood changes, confusion, nervousness, sleep disturbance and agitation    Physical Exam: Vitals:   02/19/21 1009  BP: 110/80  Pulse: 88  Temp: 97.8 F (36.6 C)  TempSrc: Oral  SpO2: 99%  Weight: 184 lb 4.8 oz (83.6 kg)  Height: 5'  6" (1.676 m)    Body mass index is 29.75 kg/m.   Constitutional: NAD, calm, comfortable Eyes: PERRL, lids and conjunctivae normal, wears corrective lenses ENMT: Mucous membranes are moist. Posterior pharynx clear of any exudate or lesions. Normal dentition. Tympanic membrane is pearly white, no erythema or bulging. Neck: normal, supple, no masses, no thyromegaly Respiratory: Some bilateral expiratory wheezes, more prominent at the lung bases.  Normal respiratory effort. No accessory muscle use.  Cardiovascular: Regular rate and rhythm, no murmurs / rubs / gallops. No extremity edema. 2+ pedal pulses. No carotid bruits.  Abdomen: no tenderness, no masses palpated. No hepatosplenomegaly. Bowel sounds positive.  Musculoskeletal: no clubbing / cyanosis. No joint deformity upper and lower extremities. Good ROM, no contractures. Normal muscle tone.  Skin: no rashes, lesions, ulcers. No induration Neurologic: CN 2-12 grossly intact.  Left-sided hemiplegia is apparent Psychiatric: Normal judgment and insight. Alert and oriented x 3. Normal mood.    Subsequent Medicare wellness visit  1. Risk factors, based on past  M,S,F -cardiovascular disease risk factors include history of prior CVA, hypertension, hyperlipidemia, type 2 diabetes   2.  Physical activities: She works out 4 days a week   3.  Depression/mood: Stable, no depression   4.  Hearing: No perceived issues   5.  ADL's: Independent in all ADLs   6.  Fall risk: Moderate fall risk due to hemiplegia and left foot drop   7.  Home safety: No problems identified   8.  Height weight, and visual acuity: height and weight as above, vision:  Vision Screening   Right eye Left eye Both eyes  Without correction     With correction _0     9.  Counseling: Advised to update her vaccinations   10. Lab orders based on risk factors: Laboratory update will be reviewed   11. Referral : None today   12. Care plan: Follow-up  with me in 3 months   13. Cognitive assessment: No cognitive impairment   14. Screening: Patient provided with a written and personalized 5-10 year screening schedule in the AVS. yes   15. Provider List Update: PCP, physical medicine Dr. Letta Pate  16. Advance Directives: Remains a full code   17. Opioids: Patient is not on any opioid prescriptions and has no risk factors for a substance use disorder.   [facility] Office Visit from 02/19/2021 in [facility] [place]  PHQ-9 Total Score 0       Fall Risk  02/19/2021 01/15/2021 05/17/2020 04/05/2020 02/23/2020  Falls in the past year? 0 0 1 1 0  Comment - - - - -  Number falls in past yr: 0 - 0 0 0  Comment - - - no recent fall -  Injury with Fall? 0 - 0 0 0  Comment - - bruises - -  Risk Factor Category  - - - - -  Risk for fall due to : - - - History of fall(s);Impaired balance/gait -  Risk for fall due to: Comment - - - - -     Impression and Plan:  Medicare annual wellness visit, subsequent -Advised routine eye and dental care. -PCV 13 in office today, she will get shingles and her last COVID-vaccine at the pharmacy. -Screening labs today. -Healthy lifestyle discussed in detail. -She had a colonoscopy in 2021. -Pap smear in 2021. -She is due for screening mammogram, will request.  Encounter for screening mammogram for malignant neoplasm of breast  - Plan: MM Digital Screening  Gastroesophageal reflux disease, unspecified whether esophagitis present -Fair control on daily PPI therapy.  Right middle cerebral artery stroke (HCC) Spastic hemiplegia affecting nondominant side (HCC)  -Noted, walks with a cane, uses a left AFO brace and follows routinely with Dr. Letta Pate for Botox injections due to her spastic hemiplegia.  Controlled type 2 diabetes mellitus with other neurologic complication, without long-term current use of insulin (HCC)  -Check A1c today. -Has been controlled recently with an A1c of 6.4  in April.  Moderate persistent asthma without complication  -I believe it is time to step up the treatment of her asthma. -She will start Flovent 2 puffs twice daily and will try and reduce frequency of albuterol usage.  I will schedule follow-up in 3 months but she knows to see me sooner if issues fail to improve.  Pure hypercholesterolemia  - Plan: Lipid panel -Last lipids corrected total cholesterol 1051, triglycerides of 77 and LDL 146. -He is on  atorvastatin daily.  Need for pneumonia vaccination -PCV 13 administered today as she received a PPSV23 in 2016.    Patient Instructions  -Nice seeing you today!!  -Lab work today; will notify you once results are available.  -Start Flovent 2 puffs twice daily, try to decrease the frequency of usage of your albuterol inhaler.  -Schedule follow up in 3 months.  -Pneumonia vaccine today.  -Remember to get your shingles and 4th COVID vaccines at the pharmacy.    Lelon Frohlich, MD [facility] at [facility]

## 2021-02-20 ENCOUNTER — Encounter: Payer: Self-pay | Admitting: Internal Medicine

## 2021-02-20 ENCOUNTER — Other Ambulatory Visit: Payer: Self-pay | Admitting: Internal Medicine

## 2021-02-20 DIAGNOSIS — E039 Hypothyroidism, unspecified: Secondary | ICD-10-CM

## 2021-02-20 MED ORDER — LEVOTHYROXINE SODIUM 25 MCG PO TABS: 25.0000 ug | ORAL_TABLET | Freq: Every day | ORAL | 1 refills | Status: DC

## 2021-02-22 ENCOUNTER — Other Ambulatory Visit: Payer: Self-pay | Admitting: Internal Medicine

## 2021-02-22 DIAGNOSIS — E039 Hypothyroidism, unspecified: Secondary | ICD-10-CM

## 2021-02-22 MED ORDER — LEVOTHYROXINE SODIUM 25 MCG PO TABS: 25.0000 ug | ORAL_TABLET | Freq: Every day | ORAL | 1 refills | Status: DC

## 2021-02-25 ENCOUNTER — Other Ambulatory Visit: Payer: Self-pay | Admitting: Internal Medicine

## 2021-02-25 DIAGNOSIS — E119 Type 2 diabetes mellitus without complications: Secondary | ICD-10-CM

## 2021-03-16 ENCOUNTER — Other Ambulatory Visit: Payer: Self-pay | Admitting: Internal Medicine

## 2021-03-16 DIAGNOSIS — I10 Essential (primary) hypertension: Secondary | ICD-10-CM

## 2021-03-28 ENCOUNTER — Other Ambulatory Visit: Payer: Self-pay

## 2021-03-29 ENCOUNTER — Ambulatory Visit (INDEPENDENT_AMBULATORY_CARE_PROVIDER_SITE_OTHER): Payer: Medicare HMO | Admitting: Internal Medicine

## 2021-03-29 ENCOUNTER — Encounter: Payer: Self-pay | Admitting: Internal Medicine

## 2021-03-29 VITALS — BP 140/80 | HR 85 | Temp 98.1°F | Wt 181.8 lb

## 2021-03-29 DIAGNOSIS — K625 Hemorrhage of anus and rectum: Secondary | ICD-10-CM

## 2021-03-29 NOTE — Progress Notes (Signed)
Established Patient Office Visit     This visit occurred during the SARS-CoV-2 public health emergency.  Safety protocols were in place, including screening questions prior to the visit, additional usage of staff PPE, and extensive cleaning of exam room while observing appropriate contact time as indicated for disinfecting solutions.    CC/Reason for Visit: Bright red blood per rectum  HPI: Rebecca Yoder is a 55 y.o. female who is coming in today for the above mentioned reasons. Past Medical History is significant for:  right MCA CVA in 2016 with significant residual left spastic hemiplegia managed by Dr. Letta Pate with Botox injections.  She also has well-controlled hypertension, hyperlipidemia, well-controlled GERD and diabetes.  She has a history of asthma.  2 days ago she made a an egg salad, she had some of this yesterday.  She thinks that eggs might have been bad because she had diarrhea and increased flatulence.  After a bowel movement yesterday evening she noticed bright red blood on the toilet paper, she also noticed that the toilet bowl had some blood in it.  She has had additional bowel movements without blood since then.  She has no abdominal pain or any other symptoms.   Past Medical/Surgical History: Past Medical History:  Diagnosis Date   Anemia    ASTHMA 12/10/2009   Asthma    Cataract    CEREBROVASCULAR ACCIDENT, HX OF 01/10/6836   Complication of anesthesia    "just can't get me up" (06/09/2013)   Controlled type 2 diabetes mellitus without complication, without long-term current use of insulin (HCC)    no meds   Embolic stroke of right basal ganglia ([place]) 06/11/2015   with L residual weakness/hemiparesis    GERD (gastroesophageal reflux disease)    HYPERLIPIDEMIA 12/10/2009   HYPERTENSION 12/10/2009   PFO (patent foramen ovale)    Stroke ([place]) 2008   denies residual on 06/09/2013   Tendonitis of wrist, right     Past Surgical History:  Procedure  Laterality Date   COLONOSCOPY     COSMETIC SURGERY     OVARIAN CYST REMOVAL  1990's   UPPER GASTROINTESTINAL ENDOSCOPY      Social History:  reports that she has never smoked. She has never used smokeless tobacco. She reports that she does not drink alcohol and does not use drugs.  Allergies: Allergies  Allergen Reactions   Bactrim [Sulfamethoxazole-Trimethoprim] Nausea Only    Nausea    Penicillins Rash    Family History:  Family History  Problem Relation Age of Onset   Multiple myeloma Mother    Breast cancer Mother    Diabetes Mother    Diabetes Father    Breast cancer Maternal Aunt    Stomach cancer Maternal Aunt    Liver cancer Maternal Aunt    Prostate cancer Maternal Uncle    Colon cancer Neg Hx    Esophageal cancer Neg Hx    Rectal cancer Neg Hx      Current Outpatient Medications:    acetaminophen (TYLENOL) 325 MG tablet, Take 2 tablets (650 mg total) by mouth every 6 (six) hours as needed., Disp: 20 tablet, Rfl: 0   albuterol (VENTOLIN HFA) 108 (90 Base) MCG/ACT inhaler, Inhale 2 puffs into the lungs every 6 (six) hours as needed., Disp: 9 g, Rfl: 1   amLODipine (NORVASC) 10 MG tablet, Take 1 tablet (10 mg total) by mouth daily., Disp: 90 tablet, Rfl: 1   atorvastatin (LIPITOR) 40 MG tablet, Take 1 tablet  by mouth once daily, Disp: 90 tablet, Rfl: 0   baclofen (LIORESAL) 10 MG tablet, TAKE 1 TABLET BY MOUTH TWICE DAILY AS NEEDED FOR MUSCLE SPASM, Disp: 60 tablet, Rfl: 0   carvedilol (COREG) 3.125 MG tablet, TAKE 1 TABLET BY MOUTH TWICE DAILY WITH MEALS, Disp: 180 tablet, Rfl: 0   clopidogrel (PLAVIX) 75 MG tablet, Take 1 tablet by mouth once daily, Disp: 90 tablet, Rfl: 1   diphenhydrAMINE (BENADRYL) 25 MG tablet, Take 50 mg by mouth at bedtime as needed. , Disp: , Rfl:    fluticasone (FLOVENT HFA) 110 MCG/ACT inhaler, Inhale 2 puffs into the lungs in the morning and at bedtime., Disp: 1 each, Rfl: 12   levothyroxine (SYNTHROID) 25 MCG tablet, Take 1 tablet (25  mcg total) by mouth daily., Disp: 90 tablet, Rfl: 1   losartan (COZAAR) 50 MG tablet, Take 1 tablet by mouth twice daily, Disp: 180 tablet, Rfl: 1   Multiple Vitamin (MULTIVITAMIN WITH MINERALS) TABS tablet, Take 1 tablet by mouth daily., Disp: , Rfl:    pantoprazole (PROTONIX) 40 MG tablet, TAKE 1 TABLET BY MOUTH AS NEEDED, Disp: 90 tablet, Rfl: 1   polyethylene glycol (MIRALAX / GLYCOLAX) packet, Take 17 g by mouth daily. (Patient taking differently: Take 17 g by mouth daily as needed.), Disp: 30 each, Rfl: 1  Review of Systems:  Constitutional: Denies fever, chills, diaphoresis, appetite change and fatigue.  HEENT: Denies photophobia, eye pain, redness, hearing loss, ear pain, congestion, sore throat, rhinorrhea, sneezing, mouth sores, trouble swallowing, neck pain, neck stiffness and tinnitus.   Respiratory: Denies SOB, DOE, cough, chest tightness,  and wheezing.   Cardiovascular: Denies chest pain, palpitations and leg swelling.  Gastrointestinal: Denies nausea, vomiting, abdominal pain, constipation,  and abdominal distention.  Genitourinary: Denies dysuria, urgency, frequency, hematuria, flank pain and difficulty urinating.  Endocrine: Denies: hot or cold intolerance, sweats, changes in hair or nails, polyuria, polydipsia. Musculoskeletal: Denies myalgias, back pain, joint swelling, arthralgias and gait problem.  Skin: Denies pallor, rash and wound.  Neurological: Denies dizziness, seizures, syncope, weakness, light-headedness, numbness and headaches.  Hematological: Denies adenopathy. Easy bruising, personal or family bleeding history  Psychiatric/Behavioral: Denies suicidal ideation, mood changes, confusion, nervousness, sleep disturbance and agitation    Physical Exam: Vitals:   03/29/21 1119  BP: 140/80  Pulse: 85  Temp: 98.1 F (36.7 C)  TempSrc: Oral  SpO2: 99%  Weight: 181 lb 12.8 oz (82.5 kg)    Body mass index is 29.34 kg/m.   Constitutional: NAD, calm,  comfortable Eyes: PERRL, lids and conjunctivae normal, wears corrective lenses ENMT: Mucous membranes are moist.   Impression and Plan:  BRBPR (bright red blood per rectum) -As this was a limited, self isolated episode, I do not believe further work-up is necessary at this time. -She knows to notify us if bleeding issues persist.  Time spent: 21 minutes reviewing chart, interviewing and examining patient and formulating plan of care.    Lelon Frohlich, MD [facility] at [facility]

## 2021-03-31 ENCOUNTER — Other Ambulatory Visit: Payer: Self-pay | Admitting: Internal Medicine

## 2021-03-31 DIAGNOSIS — I63511 Cerebral infarction due to unspecified occlusion or stenosis of right middle cerebral artery: Secondary | ICD-10-CM

## 2021-04-03 ENCOUNTER — Telehealth: Payer: Self-pay

## 2021-04-03 NOTE — Telephone Encounter (Signed)
Patient is scheduled for tomorrow at 1130.

## 2021-04-03 NOTE — Telephone Encounter (Signed)
Patient is calling in stating that she took a covid test this morning and it came back positive. Maudie Mercury said she doesn't feel that bad and is continuing to take medication that was prescribed for the diarrhea.

## 2021-04-04 ENCOUNTER — Telehealth: Payer: Medicare HMO | Admitting: Internal Medicine

## 2021-04-04 ENCOUNTER — Telehealth (INDEPENDENT_AMBULATORY_CARE_PROVIDER_SITE_OTHER): Payer: Medicare HMO | Admitting: Internal Medicine

## 2021-04-04 VITALS — Wt 181.0 lb

## 2021-04-04 DIAGNOSIS — U071 COVID-19: Secondary | ICD-10-CM | POA: Diagnosis not present

## 2021-04-04 MED ORDER — MOLNUPIRAVIR EUA 200MG CAPSULE: 4.0000 | ORAL_CAPSULE | Freq: Two times a day (BID) | ORAL | 0 refills | Status: AC

## 2021-04-04 NOTE — Progress Notes (Signed)
Virtual Visit via Video Note  I connected with Rebecca Yoder on 04/04/21 at 11:30 AM EDT by a video enabled telemedicine application and verified that I am speaking with the correct person using two identifiers.  Location patient: home Location provider: work office Persons participating in the virtual visit: patient, provider  I discussed the limitations of evaluation and management by telemedicine and the availability of in person appointments. The patient expressed understanding and agreed to proceed.   HPI: She is scheduled this visit to let me know that she tested positive for COVID.  Her sister is a household contact and tested positive for COVID 2 days ago.  She has been having diarrhea for the past 5 days.  She now has some dry cough, rhinorrhea and congestion.  Her positive test was yesterday, 8/24.  She has been taking over-the-counter antihistamines and flu medication with partial relief.  Diarrhea has now resolved.   ROS: Constitutional: Denies fever, chills, diaphoresis. HEENT: Denies photophobia, eye pain, redness,  mouth sores, trouble swallowing, neck pain, neck stiffness and tinnitus.   Respiratory: Denies SOB, DOE,  chest tightness,  and wheezing.   Cardiovascular: Denies chest pain, palpitations and leg swelling.  Gastrointestinal: Denies nausea, vomiting, abdominal pain, diarrhea, constipation, blood in stool and abdominal distention.  Genitourinary: Denies dysuria, urgency, frequency, hematuria, flank pain and difficulty urinating.  Endocrine: Denies: hot or cold intolerance, sweats, changes in hair or nails, polyuria, polydipsia. Musculoskeletal: Denies myalgias, back pain, joint swelling, arthralgias and gait problem.  Skin: Denies pallor, rash and wound.  Neurological: Denies dizziness, seizures, syncope, weakness, light-headedness, numbness and headaches.  Hematological: Denies adenopathy. Easy bruising, personal or family bleeding history   Psychiatric/Behavioral: Denies suicidal ideation, mood changes, confusion, nervousness, sleep disturbance and agitation   Past Medical History:  Diagnosis Date   Anemia    ASTHMA 12/10/2009   Asthma    Cataract    CEREBROVASCULAR ACCIDENT, HX OF 11/15/4257   Complication of anesthesia    "just can't get me up" (06/09/2013)   Controlled type 2 diabetes mellitus without complication, without long-term current use of insulin (HCC)    no meds   Embolic stroke of right basal ganglia ([place]) 06/11/2015   with L residual weakness/hemiparesis    GERD (gastroesophageal reflux disease)    HYPERLIPIDEMIA 12/10/2009   HYPERTENSION 12/10/2009   PFO (patent foramen ovale)    Stroke ([place]) 2008   denies residual on 06/09/2013   Tendonitis of wrist, right     Past Surgical History:  Procedure Laterality Date   COLONOSCOPY     COSMETIC SURGERY     OVARIAN CYST REMOVAL  1990's   UPPER GASTROINTESTINAL ENDOSCOPY      Family History  Problem Relation Age of Onset   Multiple myeloma Mother    Breast cancer Mother    Diabetes Mother    Diabetes Father    Breast cancer Maternal Aunt    Stomach cancer Maternal Aunt    Liver cancer Maternal Aunt    Prostate cancer Maternal Uncle    Colon cancer Neg Hx    Esophageal cancer Neg Hx    Rectal cancer Neg Hx     SOCIAL HX:   reports that she has never smoked. She has never used smokeless tobacco. She reports that she does not drink alcohol and does not use drugs.   Current Outpatient Medications:    acetaminophen (TYLENOL) 325 MG tablet, Take 2 tablets (650 mg total) by mouth every 6 (six) hours  as needed., Disp: 20 tablet, Rfl: 0   albuterol (VENTOLIN HFA) 108 (90 Base) MCG/ACT inhaler, Inhale 2 puffs into the lungs every 6 (six) hours as needed., Disp: 9 g, Rfl: 1   amLODipine (NORVASC) 10 MG tablet, Take 1 tablet (10 mg total) by mouth daily., Disp: 90 tablet, Rfl: 1   atorvastatin (LIPITOR) 40 MG tablet, Take 1 tablet by mouth once daily, Disp:  90 tablet, Rfl: 0   baclofen (LIORESAL) 10 MG tablet, TAKE 1 TABLET BY MOUTH TWICE DAILY AS NEEDED FOR MUSCLE SPASM, Disp: 60 tablet, Rfl: 0   carvedilol (COREG) 3.125 MG tablet, TAKE 1 TABLET BY MOUTH TWICE DAILY WITH MEALS, Disp: 180 tablet, Rfl: 0   clopidogrel (PLAVIX) 75 MG tablet, Take 1 tablet by mouth once daily, Disp: 90 tablet, Rfl: 1   diphenhydrAMINE (BENADRYL) 25 MG tablet, Take 50 mg by mouth at bedtime as needed. , Disp: , Rfl:    fluticasone (FLOVENT HFA) 110 MCG/ACT inhaler, Inhale 2 puffs into the lungs in the morning and at bedtime., Disp: 1 each, Rfl: 12   levothyroxine (SYNTHROID) 25 MCG tablet, Take 1 tablet (25 mcg total) by mouth daily., Disp: 90 tablet, Rfl: 1   losartan (COZAAR) 50 MG tablet, Take 1 tablet by mouth twice daily, Disp: 180 tablet, Rfl: 1   molnupiravir EUA 200 mg CAPS, Take 4 capsules (800 mg total) by mouth 2 (two) times daily for 5 days., Disp: 40 capsule, Rfl: 0   Multiple Vitamin (MULTIVITAMIN WITH MINERALS) TABS tablet, Take 1 tablet by mouth daily., Disp: , Rfl:    pantoprazole (PROTONIX) 40 MG tablet, TAKE 1 TABLET BY MOUTH AS NEEDED, Disp: 90 tablet, Rfl: 1   polyethylene glycol (MIRALAX / GLYCOLAX) packet, Take 17 g by mouth daily. (Patient taking differently: Take 17 g by mouth daily as needed.), Disp: 30 each, Rfl: 1  EXAM:   VITALS per patient if applicable:   GENERAL: alert, oriented, appears well and in no acute distress  HEENT: atraumatic, conjunttiva clear, no obvious abnormalities on inspection of external nose and ears  NECK: normal movements of the head and neck  LUNGS: on inspection no signs of respiratory distress, breathing rate appears normal, no obvious gross increased work of breathing, gasping or wheezing  CV: no obvious cyanosis  MS: She has a chronic left-sided hemiparesis from prior CVA  PSYCH/NEURO: pleasant and cooperative, no obvious depression or anxiety, speech and thought processing grossly intact  ASSESSMENT  AND PLAN:   COVID-19  - Plan: molnupiravir EUA 200 mg CAPS for 5 days. -She has been advised to use over-the-counter symptomatic treatment as needed such as pain relievers, antihistamines, guaifenesin, cough suppressants. -We have discussed what symptoms would prompt ER visit. -She understands 5 days of mandatory quarantine. -She knows to call me if her symptoms fail to improve.     I discussed the assessment and treatment plan with the patient. The patient was provided an opportunity to ask questions and all were answered. The patient agreed with the plan and demonstrated an understanding of the instructions.   The patient was advised to call back or seek an in-person evaluation if the symptoms worsen or if the condition fails to improve as anticipated.    Lelon Frohlich, MD  [facility] at [facility]

## 2021-04-05 ENCOUNTER — Other Ambulatory Visit: Payer: Self-pay

## 2021-04-18 ENCOUNTER — Ambulatory Visit: Payer: Medicare HMO | Admitting: Physical Medicine & Rehabilitation

## 2021-04-18 ENCOUNTER — Other Ambulatory Visit: Payer: Self-pay

## 2021-04-18 ENCOUNTER — Ambulatory Visit
Admission: RE | Admit: 2021-04-18 | Discharge: 2021-04-18 | Disposition: A | Discharge status: Home / Self Care | Payer: Medicare HMO | Source: Ambulatory Visit | Attending: Internal Medicine | Admitting: Internal Medicine

## 2021-04-18 DIAGNOSIS — Z1231 Encounter for screening mammogram for malignant neoplasm of breast: Secondary | ICD-10-CM | POA: Diagnosis not present

## 2021-04-23 ENCOUNTER — Encounter: Payer: Medicare HMO | Attending: Physical Medicine & Rehabilitation | Admitting: Physical Medicine & Rehabilitation

## 2021-04-23 ENCOUNTER — Other Ambulatory Visit: Payer: Self-pay

## 2021-04-23 ENCOUNTER — Encounter: Payer: Self-pay | Admitting: Physical Medicine & Rehabilitation

## 2021-04-23 VITALS — BP 148/83 | HR 88 | Temp 97.9°F | Ht 66.0 in | Wt 178.6 lb

## 2021-04-23 DIAGNOSIS — G8110 Spastic hemiplegia affecting unspecified side: Secondary | ICD-10-CM | POA: Insufficient documentation

## 2021-04-23 NOTE — Patient Instructions (Signed)

## 2021-04-23 NOTE — Progress Notes (Signed)
Botox Injection for spasticity using needle EMG guidance  Dilution: 100 Units/ml Indication: Severe spasticity which interferes with ADL,mobility and/or  hygiene and is unresponsive to medication management and other conservative care Informed consent was obtained after describing risks and benefits of the procedure with the patient. This includes bleeding, bruising, infection, excessive weakness, or medication side effects. A REMS form is on file and signed. Needle: 44m 25g needle electrode Number of units per muscle Total dose 200U  LEFT  Med gastroc 25U Lateral gastroc 25U Soleus 25 U FDL 75U FHL 25U Post tib 25U  All injections were done after obtaining appropriate EMG activity and after negative drawback for blood. The patient tolerated the procedure well. Post procedure instructions were given. A followup appointment was made.

## 2021-05-13 ENCOUNTER — Telehealth: Payer: Self-pay | Admitting: *Deleted

## 2021-05-13 NOTE — Chronic Care Management (AMB) (Signed)
  Chronic Care Management   Note  05/13/2021 Name: Rebecca Yoder MRN: [id number] DOB: 21-Oct-1965  Rebecca Yoder is a 56 y.o. year old female who is a primary care patient of Isaac Bliss, Rayford Halsted, MD. I reached out to Rebecca Yoder by phone today in response to a referral sent by [name] [facility] PCP.  Rebecca Yoder was given information about Chronic Care Management services today including:  CCM service includes personalized support from designated clinical staff supervised by her physician, including individualized plan of care and coordination with other care providers 24/7 contact phone numbers for assistance for urgent and routine care needs. Service will only be billed when office clinical staff spend 20 minutes or more in a month to coordinate care. Only one practitioner may furnish and bill the service in a calendar month. The patient may stop CCM services at any time (effective at the end of the month) by phone call to the office staff. The patient is responsible for co-pay (up to 20% after annual deductible is met) if co-pay is required by the individual health plan.   Patient agreed to services and verbal consent obtained.   Follow up plan: Telephone appointment with care management team member scheduled for:05/14/21  [facility]  Direct Dial: [phone]

## 2021-05-14 ENCOUNTER — Ambulatory Visit (INDEPENDENT_AMBULATORY_CARE_PROVIDER_SITE_OTHER): Payer: Medicare HMO

## 2021-05-14 ENCOUNTER — Other Ambulatory Visit: Payer: Self-pay

## 2021-05-14 DIAGNOSIS — I10 Essential (primary) hypertension: Secondary | ICD-10-CM

## 2021-05-14 DIAGNOSIS — E1149 Type 2 diabetes mellitus with other diabetic neurological complication: Secondary | ICD-10-CM

## 2021-05-14 DIAGNOSIS — I63511 Cerebral infarction due to unspecified occlusion or stenosis of right middle cerebral artery: Secondary | ICD-10-CM

## 2021-05-14 DIAGNOSIS — G8194 Hemiplegia, unspecified affecting left nondominant side: Secondary | ICD-10-CM

## 2021-05-14 DIAGNOSIS — E7800 Pure hypercholesterolemia, unspecified: Secondary | ICD-10-CM

## 2021-05-14 NOTE — Patient Instructions (Signed)
Visit Information   PATIENT GOALS:   Goals Addressed             This Visit's Progress    Keep or Improve My Strength-Stroke       Timeframe:  Long-Range Goal Priority:  Medium Start Date:     05/14/21                        Expected End Date:    10/09/21                   Follow Up Date 06/18/21    - eat healthy to increase strength - increase activity or exercise time a little every week - join an activity or exercise group in community - know who to call for help if I fall - learn how to get up if I fall - plan activity for times when energy is the highest    Why is this important?   Before the stroke you probably did not think much about being safe when you are up and about.  Now, it may be harder for you to get around.  It may also be easier for you to trip or fall.  It is common to have muscle weakness after a stroke. You may also feel like you cannot control an arm or leg.  It will be helpful to work with a physical therapist to get your strength and muscle control back.  It is good to stay as active as you can. Walking and stretching help you stay strong and flexible.  The physical therapist will develop an exercise program just for you.     Notes:      Obtain Eye Exam-Diabetes Type 2       Timeframe:  Long-Range Goal Priority:  Medium Start Date:      05/14/21                       Expected End Date:    08/10/21                   Follow Up Date 06/18/21    - schedule appointment with eye doctor    Why is this important?   Eye check-ups are important when you have diabetes.  Vision loss can be prevented.    Notes:      Track and Manage My Blood Pressure-Hypertension       Timeframe:  Long-Range Goal Priority:  High Start Date:      05/14/21                       Expected End Date:  10/09/21                     Follow Up Date 06/18/21    - check blood pressure weekly - choose a place to take my blood pressure (home, clinic or office, retail store) - write  blood pressure results in a log or diary    Why is this important?   You won't feel high blood pressure, but it can still hurt your blood vessels.  High blood pressure can cause heart or kidney problems. It can also cause a stroke.  Making lifestyle changes like losing a little weight or eating less salt will help.  Checking your blood pressure at home and at different times of the day can help to control blood pressure.  If the doctor prescribes medicine remember to take it the way the doctor ordered.  Call the office if you cannot afford the medicine or if there are questions about it.     Notes:      Track and Manage My Symptoms-Asthma       Timeframe:  Long-Range Goal Priority:  Medium Start Date:      05/14/21                       Expected End Date:     10/09/21                  Follow Up Date 06/18/21    - avoid symptom triggers outdoors - develop an asthma action plan - eliminate symptom triggers at home - keep follow-up appointments - keep rescue medicines on hand - use an extra pillow to sleep    Why is this important?   Keeping track of asthma symptoms can tell you a lot about your asthma control.  Based on symptoms and peak flow results you can see how well you are doing.  Your asthma action plan has a green, yellow and red zone. Green means all is good; it is your goal. Yellow means your symptoms are a little worse. You will need to adjust your medications. Being in the red zone means that your   asthma is out of control. You will need to use your rescue medicines. You may need emergency care.     Notes:       Asthma Action Plan, Adult An asthma action plan helps you understand how to manage your asthma and what to do when you have an asthma attack. The action plan is a color-coded plan that lists the symptoms that indicate whether or not your condition is under control and what actions to take. If you have symptoms in the green zone, you are doing well. If you have  symptoms in the yellow zone, you are having problems. If you have symptoms in the red zone, you need medical care right away. Follow the plan that you and your health care provider develop. Review your plan with your health care provider at each visit. What triggers your asthma? Knowing the things that can trigger an asthma attack or make your asthma symptoms worse is very important. Talk to your health care provider about your asthma triggers and how to avoid them. Record your known asthma triggers here: _______________ What is your personal best peak flow reading? If you use a peak flow meter, determine your personal best reading. Record it here: _______________ Rebecca Yoder zone This zone means that your asthma is under control. You may not have any symptoms while you are in the green zone. This means that you: Have no coughing or wheezing, even while you are working or playing. Sleep through the night. Are breathing well. Have a peak flow reading that is above __________ (80% of your personal best or greater). If you are in the green zone, continue to manage your asthma as directed. Take these medicines every day: Controller medicine and dosage: _______________ Controller medicine and dosage: _______________ Controller medicine and dosage: _______________ Controller medicine and dosage: _______________ Before exercise, use this reliever or rescue medicine: _______________ Call your health care provider if you are using a reliever or rescue medicine more than 2-3 times a week. Yellow zone Symptoms in this zone mean that your condition may be getting worse. You may have symptoms that  interfere with exercise, are noticeably worse after exposure to triggers, or are worse at the first sign of a cold (upper respiratory infection). These may include: Waking from sleep. Coughing, especially at night or first thing in the morning. Mild wheezing. Chest tightness. A peak flow reading that is __________  to __________ (50-79% of your personal best). If you have any of these symptoms: Add the following medicine to the ones that you use daily: Reliever or rescue medicine and dosage: _______________ Additional medicine and dosage: _______________ Call your health care provider if: You remain in the yellow zone for __________ hours. You are using a reliever or rescue medicine more than 2-3 times a week. Red zone Symptoms in this zone mean that you should get medical help right away. You will likely feel distressed and have symptoms at rest that restrict your activity. You are in the red zone if: You are breathing hard and quickly. Your nose opens wide, your ribs show, and your neck muscles become visible when you breathe in. Your lips, fingers, or toes are a bluish color. You have trouble speaking in full sentences. Your peak flow reading is less than __________ (less than 50% of your personal best). Your symptoms do not improve within 15-20 minutes after you use your reliever or rescue medicine (bronchodilator). If you have any of these symptoms: These symptoms represent a serious problem that is an emergency. Do not wait to see if the symptoms will go away. Get medical help right away. Call your local emergency services (911 in the [place] Do not drive yourself to the hospital. Use your reliever or rescue medicine. Start a nebulizer treatment or take 2-4 puffs from a metered-dose inhaler with a spacer. Repeat this action every 15-20 minutes until help arrives. Where to find more information You can find more information about asthma from: Centers for Disease Control and Prevention: http://www.wolf.info/ American Lung Association: www.lung.org This information is not intended to replace advice given to you by your health care provider. Make sure you discuss any questions you have with your health care provider. Document Revised: 09/20/2020 Document Reviewed: 09/20/2020 Elsevier Patient Education  2022  [place]. How to Take Your Blood Pressure Blood pressure measures how strongly your blood is pressing against the walls of your arteries. Arteries are blood vessels that carry blood from your heart throughout your body. You can take your blood pressure at home with a machine. You may need to check your blood pressure at home: To check if you have high blood pressure (hypertension). To check your blood pressure over time. To make sure your blood pressure medicine is working. Supplies needed: Blood pressure machine, or monitor. Dining room chair to sit in. Table or desk. Small notebook. Pencil or pen. How to prepare Avoid these things for 30 minutes before checking your blood pressure: Having drinks with caffeine in them, such as coffee or tea. Drinking alcohol. Eating. Smoking. Exercising. Do these things five minutes before checking your blood pressure: Go to the bathroom and pee (urinate). Sit in a dining chair. Do not sit on a soft couch or an armchair. Be quiet. Do not talk. How to take your blood pressure Follow the instructions that came with your machine. If you have a digital blood pressure monitor, these may be the instructions: Sit up straight. Place your feet on the floor. Do not cross your ankles or legs. Rest your left arm at the level of your heart. You may rest it on a table, desk, or  chair. Pull up your shirt sleeve. Wrap the blood pressure cuff around the upper part of your left arm. The cuff should be 1 inch (2.5 cm) above your elbow. It is best to wrap the cuff around bare skin. Fit the cuff snugly around your arm. You should be able to place only one finger between the cuff and your arm. Place the cord so that it rests in the bend of your elbow. Press the power button. Sit quietly while the cuff fills with air and loses air. Write down the numbers on the screen. Wait 2-3 minutes and then repeat steps 1-10. What do the numbers mean? Two numbers make up your  blood pressure. The first number is called systolic pressure. The second is called diastolic pressure. An example of a blood pressure reading is "120 over 80" (or 120/80). If you are an adult and do not have a medical condition, use this guide to find out if your blood pressure is normal: Normal First number: below 120. Second number: below 80. Elevated First number: 120-129. Second number: below 80. Hypertension stage 1 First number: 130-139. Second number: 80-89. Hypertension stage 2 First number: 140 or above. Second number: 62 or above. Your blood pressure is above normal even if only the first or only the second number is above normal. Follow these instructions at home: Medicines Take over-the-counter and prescription medicines only as told by your doctor. Tell your doctor if your medicine is causing side effects. General instructions Check your blood pressure as often as your doctor tells you to. Check your blood pressure at the same time every day. Take your monitor to your next doctor's appointment. Your doctor will: Make sure you are using it correctly. Make sure it is working right. Understand what your blood pressure numbers should be. Keep all follow-up visits as told by your doctor. This is important. General tips You will need a blood pressure machine, or monitor. Your doctor can suggest a monitor. You can buy one at a drugstore or online. When choosing one: Choose one with an arm cuff. Choose one that wraps around your upper arm. Only one finger should fit between your arm and the cuff. Do not choose one that measures your blood pressure from your wrist or finger. Where to find more information American Heart Association: www.heart.org Contact a doctor if: Your blood pressure keeps being high. Your blood pressure is suddenly low. Get help right away if: Your first blood pressure number is higher than 180. Your second blood pressure number is higher than  120. Summary Check your blood pressure at the same time every day. Avoid caffeine, alcohol, smoking, and exercise for 30 minutes before checking your blood pressure. Make sure you understand what your blood pressure numbers should be. This information is not intended to replace advice given to you by your health care provider. Make sure you discuss any questions you have with your health care provider. Document Revised: 06/06/2020 Document Reviewed: 07/22/2019 Elsevier Patient Education  2022 [employer].   Consent to CCM Services: Rebecca Yoder was given information about Chronic Care Management services including:  CCM service includes personalized support from designated clinical staff supervised by her physician, including individualized plan of care and coordination with other care providers 24/7 contact phone numbers for assistance for urgent and routine care needs. Service will only be billed when office clinical staff spend 20 minutes or more in a month to coordinate care. Only one practitioner may furnish and bill the service in  a calendar month. The patient may stop CCM services at any time (effective at the end of the month) by phone call to the office staff. The patient will be responsible for cost sharing (co-pay) of up to 20% of the service fee (after annual deductible is met).  Patient agreed to services and verbal consent obtained.   Patient verbalizes understanding of instructions provided today and agrees to view in [place].   Telephone follow up appointment with care management team member scheduled for:  Peter Garter RN, [facility], CDE Care Management Coordinator  [facility] [phone], Mobile [phone]   CLINICAL CARE PLAN: Patient Care Plan: Cardiovascular disease  (HTN and HLD)     Problem Identified: Cardiovascular disease  (HTN and HLD)   Priority: High     Long-Range Goal: Effective self management of Cardiovascular disease   (HTN and HLD)   Start Date: 05/14/2021  Expected End Date: 10/09/2021  This Visit's Progress: On track  Priority: High  Note:   Current Barriers:  Knowledge Deficits related to basic understanding of Cardiovascular disease  (HTN and HLD) pathophysiology and self care management with hx of diet/exercise  controlled diabetes Unable to independently self manage Cardiovascular disease  (HTN and HLD) States her B/P is good when it is checked at the doctors office.  States she only checks at home if she feels bad. STates she tries to follow a low sodium, low CHO diet.  STates she rides her exercise bike for 60 minutes 5 days a week Nurse Case Manager Clinical Goal(s):  patient will verbalize understanding of plan for hypertension management patient will not experience hospital admission. Hospital Admissions in last 6 months = 0 patient will attend all scheduled medical appointments: Dr. Jerilee Hoh 05/15/21 patient will demonstrate improved adherence to prescribed treatment plan for hypertension as evidenced by taking all medications as prescribed, monitoring and recording blood pressure as directed, adhering to low sodium/DASH diet patient will demonstrate improved health management independence as evidenced by checking blood pressure as directed and notifying PCP if SBP>160 or DBP > 90, taking all medications as prescribe, and adhering to a low sodium diet as discussed. patient will verbalize basic understanding of Cardiovascular disease  (HTN and HLD) disease process and self health management plan as evidenced by readings within limits, adherence to medications and plan of care Interventions:  Collaboration with Isaac Bliss, Rayford Halsted, MD regarding development and update of comprehensive plan of care as evidenced by provider attestation and co-signature Inter-disciplinary care team collaboration (see longitudinal plan of care) Evaluation of current treatment plan related to hypertension self  management and patient's adherence to plan as established by provider. Provided education to patient re: stroke prevention, s/s of heart attack and stroke, DASH diet, complications of uncontrolled blood pressure Reviewed medications with patient and discussed importance of compliance Discussed plans with patient for ongoing care management follow up and provided patient with direct contact information for care management team Advised patient, providing education and rationale, to monitor blood pressure daily and record, calling PCP for findings outside established parameters.  Reviewed scheduled/upcoming provider appointments including: Dr. Jerilee Hoh 05/15/21 Self-Care Activities:  Self administers medications as prescribed Attends all scheduled provider appointments Calls pharmacy for medication refills Attends church or other social activities Calls provider office for new concerns or questions Patient Goals  - Self administer medications as prescribed  - Attend all scheduled provider appointments  - Call provider office for new concerns, questions, or BP outside discussed parameters  - Check BP and record  as discussed  - Follows a low sodium diet/DASH diet - check blood pressure weekly - choose a place to take my blood pressure (home, clinic or office, retail store) - write blood pressure results in a log or diary - ask questions to understand - schedule appointment with eye doctor change to whole grain breads, cereal, pasta - eat smaller or less servings of red meat - fill half the plate with non-starchy vegetables - get blood test (fasting) done 1 week before next visit - increase the amount of fiber in food - read food labels for fat and fiber - switch to low-fat or skim milk Follow Up Plan: Telephone follow up appointment with care management team member scheduled for: 06/18/21 at 10 AM The patient has been provided with contact information for the care management team and has  been advised to call with any health related questions or concerns.      Patient Care Plan: Stroke (Adult)     Problem Identified: Residual Deficits (Stroke)   Priority: Medium     Long-Range Goal: Residual Deficits Prevented or Minimized   Start Date: 05/14/2021  Expected End Date: 10/09/2021  This Visit's Progress: On track  Priority: Medium  Note:   Current Barriers:  Knowledge Deficits related to stroke residual and fall precautions in patient with hx of CVA Unable to independently self manage stroke residual and fall safety  Knowledge Deficits related to self management of stroke residual and fall safety Chronic Disease Management support and education needs related to self management of stroke residual and fall safety States she did have a fall in September when she fell in her bathroom.  States she was sore for a few days on her lt side. Clinical Goal(s):  patient will demonstrate improved adherence to prescribed treatment plan for decreasing falls as evidenced by patient reporting and review of EMR patient will verbalize using fall risk reduction strategies discussed patient will not experience additional falls patient will verbalize understanding of plan for self management of stroke residual and fall safety patient will take all medications exactly as prescribed and will call provider for medication related questions patient will attend all scheduled medical appointments: Dr. Jerilee Hoh 05/15/21 patient will demonstrate improved adherence to prescribed treatment plan for self management of stroke residual and fall safety patient will verbalize basic understanding of self management of stroke residual and fall safety disease process and self health management plan the patient will demonstrate ongoing self health care management ability Interventions:  Collaboration with Isaac Bliss, Rayford Halsted, MD regarding development and update of comprehensive plan of care as evidenced by  provider attestation and co-signature Inter-disciplinary care team collaboration (see longitudinal plan of care) Provided written and verbal education re: Potential causes of falls and Fall prevention strategies Reviewed medications and discussed potential side effects of medications such as dizziness and frequent urination Assessed for s/s of orthostatic hypotension Assessed for falls since last encounter. Assessed patients knowledge of fall risk prevention secondary to previously provided education. Assessed working status of life alert bracelet and patient adherence Provided patient information for fall alert systems Evaluation of current treatment plan related to self management of stroke residual and fall safety and patient's adherence to plan as established by provider. Provided education to patient re: self management of stroke residual and fall safety Discussed plans with patient for ongoing care management follow up and provided patient with direct contact information for care management team Self-Care Deficits:  Unable to independently self management of stroke residual and  fall safety Patient Goals:  - Utilize cane, scooter, wheelchair (assistive device) appropriately with all ambulation - De-clutter walkways - Change positions slowly - Wear secure fitting shoes at all times with ambulation - Utilize home lighting for dim lit areas - Demonstrate self and pet awareness at all times - eat healthy to increase strength - increase activity or exercise time a little every week - join an activity or exercise group in community - know who to call for help if I fall - learn how to get up if I fall - plan activity for times when energy is the highest Follow Up Plan: Telephone follow up appointment with care management team member scheduled for: 06/18/21 at 10 AM The patient has been provided with contact information for the care management team and has been advised to call with any health  related questions or concerns.      Patient Care Plan: Asthma (Adult)     Problem Identified: Symptom Exacerbation (Asthma)   Priority: Medium     Long-Range Goal: Symptom Exacerbation Prevented or Minimized   Start Date: 05/14/2021  Expected End Date: 10/09/2021  This Visit's Progress: On track  Priority: Medium  Note:   Current Barriers:  Ineffective Self Health Maintenance in a patient with  asthma Unable to independently self manage asthma Clinical Goal(s):  Collaboration with Isaac Bliss, Rayford Halsted, MD regarding development and update of comprehensive plan of care as evidenced by provider attestation and co-signature Inter-disciplinary care team collaboration (see longitudinal plan of care) patient will work with care management team to address care coordination and chronic disease management needs related to Disease Management Educational Needs Care Coordination   Interventions:  Evaluation of current treatment plan related to  asthma , ADL IADL limitations self-management and patient's adherence to plan as established by provider. Collaboration with Isaac Bliss, Rayford Halsted, MD regarding development and update of comprehensive plan of care as evidenced by provider attestation       and co-signature Inter-disciplinary care team collaboration (see longitudinal plan of care) Discussed plans with patient for ongoing care management follow up and provided patient with direct contact information for care management team Self Care Activities:  Patient verbalizes understanding of plan to self manage asthma Self administers medications as prescribed Attends all scheduled provider appointments Calls pharmacy for medication refills Calls provider office for new concerns or questions Patient Goals: - avoid symptom triggers outdoors - develop an asthma action plan - eliminate symptom triggers at home - keep follow-up appointments - keep rescue medicines on hand - use an extra  pillow to sleep Follow Up Plan: Telephone follow up appointment with care management team member scheduled for:06/18/21 at 10 AM The patient has been provided with contact information for the care management team and has been advised to call with any health related questions or concerns.

## 2021-05-14 NOTE — Chronic Care Management (AMB) (Signed)
Chronic Care Management   CCM RN Visit Note  05/14/2021 Name: Rebecca Yoder MRN: [id number] DOB: 05-28-1966  Subjective: Rebecca Yoder is a 55 y.o. year old female who is a primary care patient of Isaac Bliss, Rayford Halsted, MD. The care management team was consulted for assistance with disease management and care coordination needs.    Engaged with patient by telephone for initial visit in response to provider referral for case management and/or care coordination services.   Consent to Services:  The patient was given the following information about Chronic Care Management services today, agreed to services, and gave verbal consent: 1. CCM service includes personalized support from designated clinical staff supervised by the primary care provider, including individualized plan of care and coordination with other care providers 2. 24/7 contact phone numbers for assistance for urgent and routine care needs. 3. Service will only be billed when office clinical staff spend 20 minutes or more in a month to coordinate care. 4. Only one practitioner may furnish and bill the service in a calendar month. 5.The patient may stop CCM services at any time (effective at the end of the month) by phone call to the office staff. 6. The patient will be responsible for cost sharing (co-pay) of up to 20% of the service fee (after annual deductible is met). Patient agreed to services and consent obtained.  Patient agreed to services and verbal consent obtained.   Assessment: Review of patient past medical history, allergies, medications, health status, including review of consultants reports, laboratory and other test data, was performed as part of comprehensive evaluation and provision of chronic care management services.   SDOH (Social Determinants of Health) assessments and interventions performed:  SDOH Interventions    Flowsheet Row Most Recent Value  SDOH Interventions   Food Insecurity  Interventions Intervention Not Indicated  Housing Interventions Intervention Not Indicated  Physical Activity Interventions Intervention Not Indicated  Stress Interventions Intervention Not Indicated  Transportation Interventions Intervention Not Indicated        CCM Care Plan  Allergies  Allergen Reactions   Bactrim [Sulfamethoxazole-Trimethoprim] Nausea Only    Nausea    Penicillins Rash    Outpatient Encounter Medications as of 05/14/2021  Medication Sig   acetaminophen (TYLENOL) 325 MG tablet Take 2 tablets (650 mg total) by mouth every 6 (six) hours as needed.   albuterol (VENTOLIN HFA) 108 (90 Base) MCG/ACT inhaler Inhale 2 puffs into the lungs every 6 (six) hours as needed.   amLODipine (NORVASC) 10 MG tablet Take 1 tablet (10 mg total) by mouth daily.   atorvastatin (LIPITOR) 40 MG tablet Take 1 tablet by mouth once daily   baclofen (LIORESAL) 10 MG tablet TAKE 1 TABLET BY MOUTH TWICE DAILY AS NEEDED FOR MUSCLE SPASM   carvedilol (COREG) 3.125 MG tablet TAKE 1 TABLET BY MOUTH TWICE DAILY WITH MEALS   clopidogrel (PLAVIX) 75 MG tablet Take 1 tablet by mouth once daily   diphenhydrAMINE (BENADRYL) 25 MG tablet Take 50 mg by mouth at bedtime as needed.    fluticasone (FLOVENT HFA) 110 MCG/ACT inhaler Inhale 2 puffs into the lungs in the morning and at bedtime.   levothyroxine (SYNTHROID) 25 MCG tablet Take 1 tablet (25 mcg total) by mouth daily.   losartan (COZAAR) 50 MG tablet Take 1 tablet by mouth twice daily   Multiple Vitamin (MULTIVITAMIN WITH MINERALS) TABS tablet Take 1 tablet by mouth daily.   pantoprazole (PROTONIX) 40 MG tablet TAKE 1 TABLET BY MOUTH AS NEEDED  polyethylene glycol (MIRALAX / GLYCOLAX) packet Take 17 g by mouth daily. (Patient taking differently: Take 17 g by mouth daily as needed.)   No facility-administered encounter medications on file as of 05/14/2021.    Patient Active Problem List   Diagnosis Date Noted   Hypothyroidism 02/20/2021   GERD  (gastroesophageal reflux disease) 03/18/2016   Spastic hemiplegia affecting nondominant side ([place]) 11/02/2015   Gait disturbance, post-stroke 08/14/2015   Frozen shoulder syndrome 08/14/2015   Subacromial impingement of left shoulder 08/14/2015   Left shoulder pain 08/10/2015   Diabetes type 2, controlled ([place]) 07/31/2015   Eczema 07/31/2015   Right middle cerebral artery stroke ([place]) 06/14/2015   Left hemiparesis ([place])    Left-sided neglect    Embolic stroke of right basal ganglia ([place]) 06/11/2015   TIA (transient ischemic attack) 06/09/2013   PFO (patent foramen ovale) 06/09/2013   Hyperlipidemia 12/10/2009   Essential hypertension 12/10/2009   Mild persistent asthma 12/10/2009   History of cardiovascular disorder 12/10/2009    Conditions to be addressed/monitored:HTN, HLD, DMII, and asthma, hx stroke  Care Plan : Cardiovascular disease  (HTN and HLD)  Updates made by Dimitri Ped, RN since 05/14/2021 12:00 AM     Problem: Cardiovascular disease  (HTN and HLD)   Priority: High     Long-Range Goal: Effective self management of Cardiovascular disease  (HTN and HLD)   Start Date: 05/14/2021  Expected End Date: 10/09/2021  This Visit's Progress: On track  Priority: High  Note:   Current Barriers:  Knowledge Deficits related to basic understanding of Cardiovascular disease  (HTN and HLD) pathophysiology and self care management with hx of diet/exercise  controlled diabetes Unable to independently self manage Cardiovascular disease  (HTN and HLD) States her B/P is good when it is checked at the doctors office.  States she only checks at home if she feels bad. STates she tries to follow a low sodium, low CHO diet.  STates she rides her exercise bike for 60 minutes 5 days a week Nurse Case Manager Clinical Goal(s):  patient will verbalize understanding of plan for hypertension management patient will not experience hospital admission. Hospital Admissions in last 6 months =  0 patient will attend all scheduled medical appointments: Dr. Jerilee Hoh 05/15/21 patient will demonstrate improved adherence to prescribed treatment plan for hypertension as evidenced by taking all medications as prescribed, monitoring and recording blood pressure as directed, adhering to low sodium/DASH diet patient will demonstrate improved health management independence as evidenced by checking blood pressure as directed and notifying PCP if SBP>160 or DBP > 90, taking all medications as prescribe, and adhering to a low sodium diet as discussed. patient will verbalize basic understanding of Cardiovascular disease  (HTN and HLD) disease process and self health management plan as evidenced by readings within limits, adherence to medications and plan of care Interventions:  Collaboration with Isaac Bliss, Rayford Halsted, MD regarding development and update of comprehensive plan of care as evidenced by provider attestation and co-signature Inter-disciplinary care team collaboration (see longitudinal plan of care) Evaluation of current treatment plan related to hypertension self management and patient's adherence to plan as established by provider. Provided education to patient re: stroke prevention, s/s of heart attack and stroke, DASH diet, complications of uncontrolled blood pressure Reviewed medications with patient and discussed importance of compliance Discussed plans with patient for ongoing care management follow up and provided patient with direct contact information for care management team Advised patient, providing education and rationale, to monitor  blood pressure daily and record, calling PCP for findings outside established parameters.  Reviewed scheduled/upcoming provider appointments including: Dr. Jerilee Hoh 05/15/21 Self-Care Activities:  Self administers medications as prescribed Attends all scheduled provider appointments Calls pharmacy for medication refills Attends church or other  social activities Calls provider office for new concerns or questions Patient Goals  - Self administer medications as prescribed  - Attend all scheduled provider appointments  - Call provider office for new concerns, questions, or BP outside discussed parameters  - Check BP and record as discussed  - Follows a low sodium diet/DASH diet - check blood pressure weekly - choose a place to take my blood pressure (home, clinic or office, retail store) - write blood pressure results in a log or diary - ask questions to understand - schedule appointment with eye doctor change to whole grain breads, cereal, pasta - eat smaller or less servings of red meat - fill half the plate with non-starchy vegetables - get blood test (fasting) done 1 week before next visit - increase the amount of fiber in food - read food labels for fat and fiber - switch to low-fat or skim milk Follow Up Plan: Telephone follow up appointment with care management team member scheduled for: 06/18/21 at 10 AM The patient has been provided with contact information for the care management team and has been advised to call with any health related questions or concerns.      Care Plan : Stroke (Adult)  Updates made by Dimitri Ped, RN since 05/14/2021 12:00 AM     Problem: Residual Deficits (Stroke)   Priority: Medium     Long-Range Goal: Residual Deficits Prevented or Minimized   Start Date: 05/14/2021  Expected End Date: 10/09/2021  This Visit's Progress: On track  Priority: Medium  Note:   Current Barriers:  Knowledge Deficits related to stroke residual and fall precautions in patient with hx of CVA Unable to independently self manage stroke residual and fall safety  Knowledge Deficits related to self management of stroke residual and fall safety Chronic Disease Management support and education needs related to self management of stroke residual and fall safety States she did have a fall in September when she  fell in her bathroom.  States she was sore for a few days on her lt side. Clinical Goal(s):  patient will demonstrate improved adherence to prescribed treatment plan for decreasing falls as evidenced by patient reporting and review of EMR patient will verbalize using fall risk reduction strategies discussed patient will not experience additional falls patient will verbalize understanding of plan for self management of stroke residual and fall safety patient will take all medications exactly as prescribed and will call provider for medication related questions patient will attend all scheduled medical appointments: Dr. Jerilee Hoh 05/15/21 patient will demonstrate improved adherence to prescribed treatment plan for self management of stroke residual and fall safety patient will verbalize basic understanding of self management of stroke residual and fall safety disease process and self health management plan the patient will demonstrate ongoing self health care management ability Interventions:  Collaboration with Isaac Bliss, Rayford Halsted, MD regarding development and update of comprehensive plan of care as evidenced by provider attestation and co-signature Inter-disciplinary care team collaboration (see longitudinal plan of care) Provided written and verbal education re: Potential causes of falls and Fall prevention strategies Reviewed medications and discussed potential side effects of medications such as dizziness and frequent urination Assessed for s/s of orthostatic hypotension Assessed for falls since last  encounter. Assessed patients knowledge of fall risk prevention secondary to previously provided education. Assessed working status of life alert bracelet and patient adherence Provided patient information for fall alert systems Evaluation of current treatment plan related to self management of stroke residual and fall safety and patient's adherence to plan as established by  provider. Provided education to patient re: self management of stroke residual and fall safety Discussed plans with patient for ongoing care management follow up and provided patient with direct contact information for care management team Self-Care Deficits:  Unable to independently self management of stroke residual and fall safety Patient Goals:  - Utilize cane, scooter, wheelchair (assistive device) appropriately with all ambulation - De-clutter walkways - Change positions slowly - Wear secure fitting shoes at all times with ambulation - Utilize home lighting for dim lit areas - Demonstrate self and pet awareness at all times - eat healthy to increase strength - increase activity or exercise time a little every week - join an activity or exercise group in community - know who to call for help if I fall - learn how to get up if I fall - plan activity for times when energy is the highest Follow Up Plan: Telephone follow up appointment with care management team member scheduled for: 06/18/21 at 10 AM The patient has been provided with contact information for the care management team and has been advised to call with any health related questions or concerns.      Care Plan : Asthma (Adult)  Updates made by Dimitri Ped, RN since 05/14/2021 12:00 AM     Problem: Symptom Exacerbation (Asthma)   Priority: Medium     Long-Range Goal: Symptom Exacerbation Prevented or Minimized   Start Date: 05/14/2021  Expected End Date: 10/09/2021  This Visit's Progress: On track  Priority: Medium  Note:   Current Barriers:  Ineffective Self Health Maintenance in a patient with  asthma Unable to independently self manage asthma Clinical Goal(s):  Collaboration with Isaac Bliss, Rayford Halsted, MD regarding development and update of comprehensive plan of care as evidenced by provider attestation and co-signature Inter-disciplinary care team collaboration (see longitudinal plan of care) patient  will work with care management team to address care coordination and chronic disease management needs related to Disease Management Educational Needs Care Coordination   Interventions:  Evaluation of current treatment plan related to  asthma , ADL IADL limitations self-management and patient's adherence to plan as established by provider. Collaboration with Isaac Bliss, Rayford Halsted, MD regarding development and update of comprehensive plan of care as evidenced by provider attestation       and co-signature Inter-disciplinary care team collaboration (see longitudinal plan of care) Discussed plans with patient for ongoing care management follow up and provided patient with direct contact information for care management team Self Care Activities:  Patient verbalizes understanding of plan to self manage asthma Self administers medications as prescribed Attends all scheduled provider appointments Calls pharmacy for medication refills Calls provider office for new concerns or questions Patient Goals: - avoid symptom triggers outdoors - develop an asthma action plan - eliminate symptom triggers at home - keep follow-up appointments - keep rescue medicines on hand - use an extra pillow to sleep Follow Up Plan: Telephone follow up appointment with care management team member scheduled for:06/18/21 at 10 AM The patient has been provided with contact information for the care management team and has been advised to call with any health related questions or concerns.  Plan:Telephone follow up appointment with care management team member scheduled for:  06/18/21 and The patient has been provided with contact information for the care management team and has been advised to call with any health related questions or concerns.  Peter Garter RN, Jackquline Denmark, CDE Care Management Coordinator [facility] [phone], Mobile [phone]

## 2021-05-15 ENCOUNTER — Encounter: Payer: Self-pay | Admitting: Internal Medicine

## 2021-05-15 ENCOUNTER — Ambulatory Visit (INDEPENDENT_AMBULATORY_CARE_PROVIDER_SITE_OTHER): Payer: Medicare HMO | Admitting: Internal Medicine

## 2021-05-15 VITALS — BP 120/78 | HR 77 | Temp 97.5°F | Wt 186.0 lb

## 2021-05-15 DIAGNOSIS — Z23 Encounter for immunization: Secondary | ICD-10-CM | POA: Diagnosis not present

## 2021-05-15 DIAGNOSIS — I10 Essential (primary) hypertension: Secondary | ICD-10-CM | POA: Diagnosis not present

## 2021-05-15 DIAGNOSIS — J4530 Mild persistent asthma, uncomplicated: Secondary | ICD-10-CM | POA: Diagnosis not present

## 2021-05-15 DIAGNOSIS — E1149 Type 2 diabetes mellitus with other diabetic neurological complication: Secondary | ICD-10-CM

## 2021-05-15 DIAGNOSIS — G8110 Spastic hemiplegia affecting unspecified side: Secondary | ICD-10-CM

## 2021-05-15 DIAGNOSIS — E039 Hypothyroidism, unspecified: Secondary | ICD-10-CM

## 2021-05-15 DIAGNOSIS — E7800 Pure hypercholesterolemia, unspecified: Secondary | ICD-10-CM

## 2021-05-15 LAB — TSH: TSH: 3.23 u[IU]/mL (ref 0.35–5.50)

## 2021-05-15 NOTE — Addendum Note (Signed)
Addended by: Westley Hummer B on: 05/15/2021 02:55 PM   Modules accepted: Orders

## 2021-05-15 NOTE — Progress Notes (Signed)
Established Patient Office Visit     This visit occurred during the SARS-CoV-2 public health emergency.  Safety protocols were in place, including screening questions prior to the visit, additional usage of staff PPE, and extensive cleaning of exam room while observing appropriate contact time as indicated for disinfecting solutions.    CC/Reason for Visit: 53-monthfollow-up chronic medical conditions  HPI: Rebecca Birdwellis a 55y.o. female who is coming in today for the above mentioned reasons. Past Medical History is significant for: Hypertension, hyperlipidemia, type 2 diabetes, GERD, mild persistent asthma, she has a history of spastic hemiplegia following a right MCA CVA.  She was most recently diagnosed with hypothyroidism and started on levothyroxine 25 mcg daily.  She also overall and has no acute concerns.  She is requesting a flu vaccine today.  She is also due her COVID booster.   Past Medical/Surgical History: Past Medical History:  Diagnosis Date   Anemia    ASTHMA 12/10/2009   Asthma    Cataract    CEREBROVASCULAR ACCIDENT, HX OF 58/03/3373  Complication of anesthesia    "just can't get me up" (06/09/2013)   Controlled type 2 diabetes mellitus without complication, without long-term current use of insulin (HCC)    no meds   Emb[place] of right basal [place] 06/11/2015   with L residual weakness/hemiparesis    GERD (gastroesophageal reflux disease)    HYPERLIPIDEMIA 12/10/2009   HYPERTENSION 12/10/2009   PFO (patent foramen ovale)    [place]   denies residual on 06/09/2013   Tendonitis of wrist, right     Past Surgical History:  Procedure Laterality Date   COLONOSCOPY     COSMETIC SURGERY     OVARIAN CYST REMOVAL  1990's   UPPER GASTROINTESTINAL ENDOSCOPY      Social History:  reports that she has never smoked. She has never used smokeless tobacco. She reports that she does not drink alcohol and does not use  drugs.  Allergies: Allergies  Allergen Reactions   Bactrim [Sulfamethoxazole-Trimethoprim] Nausea Only    Nausea    Penicillins Rash    Family History:  Family History  Problem Relation Age of Onset   Multiple myeloma Mother    Breast cancer Mother    Diabetes Mother    Diabetes Father    Breast cancer Maternal Aunt    Stomach cancer Maternal Aunt    Liver cancer Maternal Aunt    Prostate cancer Maternal Uncle    Colon cancer Neg Hx    Esophageal cancer Neg Hx    Rectal cancer Neg Hx      Current Outpatient Medications:    acetaminophen (TYLENOL) 325 MG tablet, Take 2 tablets (650 mg total) by mouth every 6 (six) hours as needed., Disp: 20 tablet, Rfl: 0   albuterol (VENTOLIN HFA) 108 (90 Base) MCG/ACT inhaler, Inhale 2 puffs into the lungs every 6 (six) hours as needed., Disp: 9 g, Rfl: 1   amLODipine (NORVASC) 10 MG tablet, Take 1 tablet (10 mg total) by mouth daily., Disp: 90 tablet, Rfl: 1   atorvastatin (LIPITOR) 40 MG tablet, Take 1 tablet by mouth once daily, Disp: 90 tablet, Rfl: 0   baclofen (LIORESAL) 10 MG tablet, TAKE 1 TABLET BY MOUTH TWICE DAILY AS NEEDED FOR MUSCLE SPASM, Disp: 60 tablet, Rfl: 0   carvedilol (COREG) 3.125 MG tablet, TAKE 1 TABLET BY MOUTH TWICE DAILY WITH MEALS, Disp: 180 tablet, Rfl: 0   clopidogrel (  PLAVIX) 75 MG tablet, Take 1 tablet by mouth once daily, Disp: 90 tablet, Rfl: 1   diphenhydrAMINE (BENADRYL) 25 MG tablet, Take 50 mg by mouth at bedtime as needed. , Disp: , Rfl:    fluticasone (FLOVENT HFA) 110 MCG/ACT inhaler, Inhale 2 puffs into the lungs in the morning and at bedtime., Disp: 1 each, Rfl: 12   levothyroxine (SYNTHROID) 25 MCG tablet, Take 1 tablet (25 mcg total) by mouth daily., Disp: 90 tablet, Rfl: 1   losartan (COZAAR) 50 MG tablet, Take 1 tablet by mouth twice daily, Disp: 180 tablet, Rfl: 1   Multiple Vitamin (MULTIVITAMIN WITH MINERALS) TABS tablet, Take 1 tablet by mouth daily., Disp: , Rfl:    pantoprazole (PROTONIX)  40 MG tablet, TAKE 1 TABLET BY MOUTH AS NEEDED, Disp: 90 tablet, Rfl: 1   polyethylene glycol (MIRALAX / GLYCOLAX) packet, Take 17 g by mouth daily. (Patient taking differently: Take 17 g by mouth daily as needed.), Disp: 30 each, Rfl: 1  Review of Systems:  Constitutional: Denies fever, chills, diaphoresis, appetite change and fatigue.  HEENT: Denies photophobia, eye pain, redness, hearing loss, ear pain, congestion, sore throat, rhinorrhea, sneezing, mouth sores, trouble swallowing, neck pain, neck stiffness and tinnitus.   Respiratory: Denies SOB, DOE, cough, chest tightness,  and wheezing.   Cardiovascular: Denies chest pain, palpitations and leg swelling.  Gastrointestinal: Denies nausea, vomiting, abdominal pain, diarrhea, constipation, blood in stool and abdominal distention.  Genitourinary: Denies dysuria, urgency, frequency, hematuria, flank pain and difficulty urinating.  Endocrine: Denies: hot or cold intolerance, sweats, changes in hair or nails, polyuria, polydipsia. Musculoskeletal: Denies myalgias, back pain, joint swelling, arthralgias and gait problem.  Skin: Denies pallor, rash and wound.  Neurological: Denies dizziness, seizures, syncope, weakness, light-headedness, numbness and headaches.  Hematological: Denies adenopathy. Easy bruising, personal or family bleeding history  Psychiatric/Behavioral: Denies suicidal ideation, mood changes, confusion, nervousness, sleep disturbance and agitation    Physical Exam: Vitals:   05/15/21 1316  BP: 120/78  Pulse: 77  Temp: (!) 97.5 F (36.4 C)  TempSrc: Oral  SpO2: 99%  Weight: 186 lb (84.4 kg)    Body mass index is 30.02 kg/m.   Constitutional: NAD, calm, comfortable Eyes: PERRL, lids and conjunctivae normal, wears corrective lenses ENMT: Mucous membranes are moist.  Respiratory: clear to auscultation bilaterally, no wheezing, no crackles. Normal respiratory effort. No accessory muscle use.  Cardiovascular: Regular  rate and rhythm, no murmurs / rubs / gallops. No extremity edema.  Abdomen: no tenderness, no masses palpated. No hepatosplenomegaly. Bowel sounds positive.  Psychiatric: Normal judgment and insight. Alert and oriented x 3. Normal mood.    Impression and Plan:  Acquired hypothyroidism  - Plan: TSH -New diagnosis.  Essential hypertension -Well-controlled.  Pure hypercholesterolemia -Last lipid panel in July 2022 with a total cholesterol of 114, triglycerides 103 and LDL 49, she is on atorvastatin daily.  Mild persistent asthma without complication -Well-controlled on Flovent and as needed albuterol.  Controlled type 2 diabetes mellitus with other neurologic complication, without long-term current use of insulin (HCC) -Last A1c was 6.8 in July 2022.  Spastic hemiplegia affecting nondominant side (HCC) -Following CVA.  Need for influenza vaccination -Flu vaccine administered today.  Time spent: 32 minutes reviewing chart, interviewing and examining patient and formulating plan of care.   Patient Instructions  -Nice seeing you today!!  -Lab work today; will notify you once results are available.  -Flu vaccine today.  -Remember your COVID booster.  -Schedule follow up in 3  months.   Lelon Frohlich, MD [facility]

## 2021-05-15 NOTE — Addendum Note (Signed)
Addended by: Amanda Cockayne on: 05/15/2021 02:16 PM   Modules accepted: Orders

## 2021-05-15 NOTE — Patient Instructions (Signed)
-  Nice seeing you today!!  -Lab work today; will notify you once results are available.  -Flu vaccine today.  -Remember your COVID booster.  -Schedule follow up in 3 months.

## 2021-05-22 ENCOUNTER — Other Ambulatory Visit: Payer: Self-pay | Admitting: Internal Medicine

## 2021-05-22 DIAGNOSIS — E119 Type 2 diabetes mellitus without complications: Secondary | ICD-10-CM

## 2021-06-07 ENCOUNTER — Other Ambulatory Visit: Payer: Self-pay

## 2021-06-07 ENCOUNTER — Encounter: Payer: Medicare HMO | Attending: Physical Medicine & Rehabilitation | Admitting: Physical Medicine & Rehabilitation

## 2021-06-07 ENCOUNTER — Encounter: Payer: Self-pay | Admitting: Physical Medicine & Rehabilitation

## 2021-06-07 VITALS — BP 126/81 | HR 75 | Ht 66.0 in | Wt 186.0 lb

## 2021-06-07 DIAGNOSIS — G8110 Spastic hemiplegia affecting unspecified side: Secondary | ICD-10-CM

## 2021-06-07 NOTE — Progress Notes (Signed)
Subjective:    Patient ID: Rebecca Yoder, female    DOB: Aug 21, 1965, 55 y.o.   MRN: [id number]  HPI  55 year old female with history of right CVA with left spastic hemiplegia.  She is modified independent with all self-care and mobility.  She continues to have issues with spasticity but these are well controlled with botulinum toxin injections.  She is very compliant with exercise program she also does stretching of affected muscle groups in the lower extremity including ankle and toes. Doing exercise and biking ~5 d per week Complained of developing right ankle pain after doing squats.  We discussed pacing activities She had no falls no other injuries.  She feels like it is getting better and would like to know whether it is safe to go workout again. LEFT  Med gastroc 25U Lateral gastroc 25U Soleus 25 U FDL 75U FHL 25U Post tib 25U Pain Inventory Average Pain 4 Pain Right Now 0 My pain is intermittent and sharp  In the last 24 hours, has pain interfered with the following? General activity 8 Relation with others 9 Enjoyment of life 8 What TIME of day is your pain at its worst? night Sleep (in general) Fair  Pain is worse with: inactivity Pain improves with: pacing activities Relief from Meds: 9  Family History  Problem Relation Age of Onset   Multiple myeloma Mother    Breast cancer Mother    Diabetes Mother    Diabetes Father    Breast cancer Maternal Aunt    Stomach cancer Maternal Aunt    Liver cancer Maternal Aunt    Prostate cancer Maternal Uncle    Colon cancer Neg Hx    Esophageal cancer Neg Hx    Rectal cancer Neg Hx    Social History   Socioeconomic History   Marital status: Single    Spouse name: Not on file   Number of children: 0   Years of education: 12th   Highest education level: Not on file  Occupational History   Occupation: budd    Fish farm manager: [employer]  Tobacco Use   Smoking status: Never   Smokeless tobacco: Never  Vaping  Use   Vaping Use: Never used  Substance and Sexual Activity   Alcohol use: No    Alcohol/week: 0.0 standard drinks   Drug use: No   Sexual activity: Yes  Other Topics Concern   Not on file  Social History Narrative   Not on file   Social Determinants of Health   Financial Resource Strain: Not on file  Food Insecurity: No Food Insecurity   Worried About Running Out of Food in the Last Year: Never true   [place] in the Last Year: Never true  Transportation Needs: No Transportation Needs   Lack of Transportation (Medical): No   Lack of Transportation (Non-Medical): No  Physical Activity: Sufficiently Active   Days of Exercise per Week: 5 days   Minutes of Exercise per Session: 60 min  Stress: No Stress Concern Present   Feeling of Stress : Not at all  Social Connections: Not on file   Past Surgical History:  Procedure Laterality Date   COLONOSCOPY     COSMETIC SURGERY     OVARIAN CYST REMOVAL  1990's   UPPER GASTROINTESTINAL ENDOSCOPY     Past Surgical History:  Procedure Laterality Date   COLONOSCOPY     COSMETIC SURGERY     OVARIAN CYST REMOVAL  1990's   UPPER  GASTROINTESTINAL ENDOSCOPY     Past Medical History:  Diagnosis Date   Anemia    ASTHMA 12/10/2009   Asthma    Cataract    CEREBROVASCULAR ACCIDENT, HX OF 11/12/2759   Complication of anesthesia    "just can't get me up" (06/09/2013)   Controlled type 2 diabetes mellitus without complication, without long-term current use of insulin (HCC)    no meds   Embolic stroke of right basal ganglia ([place]) 06/11/2015   with L residual weakness/hemiparesis    GERD (gastroesophageal reflux disease)    HYPERLIPIDEMIA 12/10/2009   HYPERTENSION 12/10/2009   PFO (patent foramen ovale)    Stroke ([place]) 2008   denies residual on 06/09/2013   Tendonitis of wrist, right    Ht _0  (1.676 m)   Wt 186 lb (84.4 kg)   BMI 30.02 kg/m   Opioid Risk Score:   Fall Risk Score:  `1  Depression screen PHQ 2/9  Depression  screen [facility] 2/9 06/07/2021 05/14/2021 02/19/2021 01/15/2021 04/05/2020 02/23/2020 04/19/2019  Decreased Interest 0 0 0 0 0 0 0  Down, Depressed, Hopeless 0 0 0 0 0 0 0  PHQ - 2 Score 0 0 0 0 0 0 0  Altered sleeping - - 0 - - - 0  Tired, decreased energy - - 0 - - - 0  Change in appetite - - 0 - - - 0  Feeling bad or failure about yourself  - - 0 - - - 0  Trouble concentrating - - 0 - - - 0  Moving slowly or fidgety/restless - - 0 - - - 0  Suicidal thoughts - - 0 - - - 0  PHQ-9 Score - - 0 - - - 0  Difficult doing work/chores - - Not difficult at all - - - Not difficult at all  Some recent data might be hidden    Review of Systems  Musculoskeletal:  Positive for gait problem.  Neurological:  Positive for weakness.       Left side weakness  All other systems reviewed and are negative.     Objective:   Physical Exam Vitals reviewed.  Constitutional:      Appearance: Normal appearance.  HENT:     Head: Normocephalic and atraumatic.  Eyes:     Extraocular Movements: Extraocular movements intact.     Conjunctiva/sclera: Conjunctivae normal.     Pupils: Pupils are equal, round, and reactive to light.  Neurological:     Mental Status: She is alert and oriented to person, place, and time.     Cranial Nerves: No dysarthria or facial asymmetry.     Motor: Abnormal muscle tone present.     Coordination: Coordination abnormal. Impaired rapid alternating movements.     Gait: Gait abnormal.     Comments: Motor strength is 5/5 in the right deltoid, bicep, tricep, grip, hip flexor, knee extensor, ankle dorsiflexor Left side 3 - at the deltoid and bicep as well as finger flexors 0 at the finger extensors, has increased tone at the wrist flexors elbow flexors and finger flexors MAS 2/3 Left lower extremity motor strength is 3 - hip flexor 4 at the knee extensor 3 - ankle dorsiflexor with foot inversion. She is unable to evert the foot. Tone MAS 2 at the ankle plantar flexors at the foot inverters, 1  at the toe flexors. Mildly hyperactive Babinski  Psychiatric:        Mood and Affect: Mood normal.  Behavior: Behavior normal.    Right ankle no evidence of effusion no ecchymosis no tenderness over the anterior talofibular ligament there is pain with passive inversion and plantarflexion of the foot.  No pain over the Achilles tendon no pain over the plantar fascia.      Assessment & Plan:   1.  History of right CVA causing left spastic hemiplegia overall doing well in terms functionally Her tone is well controlled with botulinum toxin injection would continue with current dosing. Went over lower extremity stretching program including stretching the foot inverters toe flexors and ankle plantar flexors.  No evidence of contracture. 2.  Right ankle pain improving, she does have some mild pain with inversion she may have had an inversion injury that is resolving  We discussed she can go back to her working out but do only one half the reps and one half the time on the exercise bike for the first week and then increase back to her usual workout.  She can call if she develops pain once again.  I will see her back in 6 weeks for repeat Botox injection of the same muscle groups on the left lower extremity

## 2021-06-07 NOTE — Patient Instructions (Addendum)
Practice stretching foot and toes   Reduce workout to 1/2 of normal for 1 week prior to going back to full

## 2021-06-10 DIAGNOSIS — I10 Essential (primary) hypertension: Secondary | ICD-10-CM

## 2021-06-10 DIAGNOSIS — I63511 Cerebral infarction due to unspecified occlusion or stenosis of right middle cerebral artery: Secondary | ICD-10-CM

## 2021-06-10 DIAGNOSIS — E7800 Pure hypercholesterolemia, unspecified: Secondary | ICD-10-CM | POA: Diagnosis not present

## 2021-06-10 DIAGNOSIS — E1149 Type 2 diabetes mellitus with other diabetic neurological complication: Secondary | ICD-10-CM

## 2021-06-11 ENCOUNTER — Ambulatory Visit: Payer: Medicare HMO | Admitting: Physical Medicine & Rehabilitation

## 2021-06-16 ENCOUNTER — Other Ambulatory Visit: Payer: Self-pay | Admitting: Internal Medicine

## 2021-06-16 DIAGNOSIS — I10 Essential (primary) hypertension: Secondary | ICD-10-CM

## 2021-06-18 ENCOUNTER — Ambulatory Visit (INDEPENDENT_AMBULATORY_CARE_PROVIDER_SITE_OTHER): Payer: Medicare HMO

## 2021-06-18 DIAGNOSIS — E1149 Type 2 diabetes mellitus with other diabetic neurological complication: Secondary | ICD-10-CM

## 2021-06-18 DIAGNOSIS — G8110 Spastic hemiplegia affecting unspecified side: Secondary | ICD-10-CM

## 2021-06-18 DIAGNOSIS — I10 Essential (primary) hypertension: Secondary | ICD-10-CM

## 2021-06-18 DIAGNOSIS — E7800 Pure hypercholesterolemia, unspecified: Secondary | ICD-10-CM

## 2021-06-18 DIAGNOSIS — J4530 Mild persistent asthma, uncomplicated: Secondary | ICD-10-CM

## 2021-06-18 NOTE — Patient Instructions (Addendum)
Visit Information -eat healthy to increase strength - increase activity or exercise time a little every week- join an activity or exercise group in community - know who to call for help if I fall - learn how to get up if I fall - plan activity for times when energy is the highest - schedule appointment with eye doctor change to whole grain breads, cereal, pasta - eat smaller or less servings of red meat - fill half the plate with nonstarchy vegetables - get blood test (fasting) done 1 week before next visit - increase the amount of fiber in food - read food labels for fat and fiber - switch to low-fat or skim milk avoid symptom triggers outdoors - develop an asthma action plan - eliminate symptom triggers at home - keep follow-up appointments - keep rescue medicines on hand - use an extra pillow to sleep  Patient verbalizes understanding of instructions provided today and agrees to view in [place].   Telephone follow up appointment with care management team member scheduled for: 09/02/21 at [place] RN, [facility], CDE Care Management Coordinator [facility] [phone], Mobile [phone]

## 2021-06-18 NOTE — Chronic Care Management (AMB) (Signed)
Chronic Care Management   CCM RN Visit Note  06/18/2021 Name: Rebecca Yoder MRN: [id number] DOB: 1966/06/16  Subjective: Rebecca Yoder is a 55 y.o. year old female who is a primary care patient of Isaac Bliss, Rayford Halsted, MD. The care management team was consulted for assistance with disease management and care coordination needs.    Engaged with patient by telephone for follow up visit in response to provider referral for case management and/or care coordination services.   Consent to Services:  The patient was given information about Chronic Care Management services, agreed to services, and gave verbal consent prior to initiation of services.  Please see initial visit note for detailed documentation.   Patient agreed to services and verbal consent obtained.   Assessment: Review of patient past medical history, allergies, medications, health status, including review of consultants reports, laboratory and other test data, was performed as part of comprehensive evaluation and provision of chronic care management services.   SDOH (Social Determinants of Health) assessments and interventions performed:    CCM Care Plan  Allergies  Allergen Reactions   Bactrim [Sulfamethoxazole-Trimethoprim] Nausea Only    Nausea    Penicillins Rash    Outpatient Encounter Medications as of 06/18/2021  Medication Sig   acetaminophen (TYLENOL) 325 MG tablet Take 2 tablets (650 mg total) by mouth every 6 (six) hours as needed.   albuterol (VENTOLIN HFA) 108 (90 Base) MCG/ACT inhaler Inhale 2 puffs into the lungs every 6 (six) hours as needed.   amLODipine (NORVASC) 10 MG tablet Take 1 tablet (10 mg total) by mouth daily.   atorvastatin (LIPITOR) 40 MG tablet Take 1 tablet by mouth once daily   baclofen (LIORESAL) 10 MG tablet TAKE 1 TABLET BY MOUTH TWICE DAILY AS NEEDED FOR MUSCLE SPASM   carvedilol (COREG) 3.125 MG tablet TAKE 1 TABLET BY MOUTH TWICE DAILY WITH MEALS   clopidogrel  (PLAVIX) 75 MG tablet Take 1 tablet by mouth once daily   diphenhydrAMINE (BENADRYL) 25 MG tablet Take 50 mg by mouth at bedtime as needed.    fluticasone (FLOVENT HFA) 110 MCG/ACT inhaler Inhale 2 puffs into the lungs in the morning and at bedtime.   levothyroxine (SYNTHROID) 25 MCG tablet Take 1 tablet (25 mcg total) by mouth daily.   losartan (COZAAR) 50 MG tablet Take 1 tablet by mouth twice daily   Multiple Vitamin (MULTIVITAMIN WITH MINERALS) TABS tablet Take 1 tablet by mouth daily.   pantoprazole (PROTONIX) 40 MG tablet TAKE 1 TABLET BY MOUTH AS NEEDED   polyethylene glycol (MIRALAX / GLYCOLAX) packet Take 17 g by mouth daily. (Patient taking differently: Take 17 g by mouth daily as needed.)   No facility-administered encounter medications on file as of 06/18/2021.    Patient Active Problem List   Diagnosis Date Noted   Hypothyroidism 02/20/2021   GERD (gastroesophageal reflux disease) 03/18/2016   Spastic hemiplegia affecting nondominant side ([place]) 11/02/2015   Gait disturbance, post-stroke 08/14/2015   Frozen shoulder syndrome 08/14/2015   Subacromial impingement of left shoulder 08/14/2015   Left shoulder pain 08/10/2015   Diabetes type 2, controlled ([place]) 07/31/2015   Eczema 07/31/2015   Right middle cerebral artery stroke ([place]) 06/14/2015   Left hemiparesis ([place])    Left-sided neglect    Embolic stroke of right basal ganglia ([place]) 06/11/2015   TIA (transient ischemic attack) 06/09/2013   PFO (patent foramen ovale) 06/09/2013   Hyperlipidemia 12/10/2009   Essential hypertension 12/10/2009   Mild persistent asthma 12/10/2009  History of cardiovascular disorder 12/10/2009    Conditions to be addressed/monitored:HTN, HLD, DMII, Asthma, and hx stroke  Care Plan : Cardiovascular disease  (HTN and HLD)  Updates made by Dimitri Ped, RN since 06/18/2021 12:00 AM  Completed 06/18/2021   Problem: Cardiovascular disease  (HTN and HLD) Resolved 06/18/2021  Priority: High      Long-Range Goal: Effective self management of Cardiovascular disease  (HTN and HLD) Completed 06/18/2021  Start Date: 05/14/2021  Expected End Date: 10/09/2021  Recent Progress: On track  Priority: High  Note:   Resolving due to duplicate goal Current Barriers:  Knowledge Deficits related to basic understanding of Cardiovascular disease  (HTN and HLD) pathophysiology and self care management with hx of diet/exercise  controlled diabetes Unable to independently self manage Cardiovascular disease  (HTN and HLD) States her B/P is good when it is checked at the doctors office.  States she only checks at home if she feels bad. STates she tries to follow a low sodium, low CHO diet.  STates she rides her exercise bike for 60 minutes 5 days a week Nurse Case Manager Clinical Goal(s):  patient will verbalize understanding of plan for hypertension management patient will not experience hospital admission. Hospital Admissions in last 6 months = 0 patient will attend all scheduled medical appointments: Dr. Jerilee Hoh 05/15/21 patient will demonstrate improved adherence to prescribed treatment plan for hypertension as evidenced by taking all medications as prescribed, monitoring and recording blood pressure as directed, adhering to low sodium/DASH diet patient will demonstrate improved health management independence as evidenced by checking blood pressure as directed and notifying PCP if SBP>160 or DBP > 90, taking all medications as prescribe, and adhering to a low sodium diet as discussed. patient will verbalize basic understanding of Cardiovascular disease  (HTN and HLD) disease process and self health management plan as evidenced by readings within limits, adherence to medications and plan of care Interventions:  Collaboration with Isaac Bliss, Rayford Halsted, MD regarding development and update of comprehensive plan of care as evidenced by provider attestation and co-signature Inter-disciplinary care  team collaboration (see longitudinal plan of care) Evaluation of current treatment plan related to hypertension self management and patient's adherence to plan as established by provider. Provided education to patient re: stroke prevention, s/s of heart attack and stroke, DASH diet, complications of uncontrolled blood pressure Reviewed medications with patient and discussed importance of compliance Discussed plans with patient for ongoing care management follow up and provided patient with direct contact information for care management team Advised patient, providing education and rationale, to monitor blood pressure daily and record, calling PCP for findings outside established parameters.  Reviewed scheduled/upcoming provider appointments including: Dr. Jerilee Hoh 05/15/21 Self-Care Activities:  Self administers medications as prescribed Attends all scheduled provider appointments Calls pharmacy for medication refills Attends church or other social activities Calls provider office for new concerns or questions Patient Goals  - Self administer medications as prescribed  - Attend all scheduled provider appointments  - Call provider office for new concerns, questions, or BP outside discussed parameters  - Check BP and record as discussed  - Follows a low sodium diet/DASH diet - check blood pressure weekly - choose a place to take my blood pressure (home, clinic or office, retail store) - write blood pressure results in a log or diary - ask questions to understand - schedule appointment with eye doctor change to whole grain breads, cereal, pasta - eat smaller or less servings of red meat - fill  half the plate with non-starchy vegetables - get blood test (fasting) done 1 week before next visit - increase the amount of fiber in food - read food labels for fat and fiber - switch to low-fat or skim milk Follow Up Plan: Telephone follow up appointment with care management team member scheduled  for: 06/18/21 at 10 AM The patient has been provided with contact information for the care management team and has been advised to call with any health related questions or concerns.      Care Plan : Stroke (Adult)  Updates made by Dimitri Ped, RN since 06/18/2021 12:00 AM  Completed 06/18/2021   Problem: Residual Deficits (Stroke) Resolved 06/18/2021  Priority: Medium     Long-Range Goal: Residual Deficits Prevented or Minimized Completed 06/18/2021  Start Date: 05/14/2021  Expected End Date: 10/09/2021  Recent Progress: On track  Priority: Medium  Note:   Resolving due to duplicate goal Current Barriers:  Knowledge Deficits related to stroke residual and fall precautions in patient with hx of CVA Unable to independently self manage stroke residual and fall safety  Knowledge Deficits related to self management of stroke residual and fall safety Chronic Disease Management support and education needs related to self management of stroke residual and fall safety States she did have a fall in September when she fell in her bathroom.  States she was sore for a few days on her lt side. Clinical Goal(s):  patient will demonstrate improved adherence to prescribed treatment plan for decreasing falls as evidenced by patient reporting and review of EMR patient will verbalize using fall risk reduction strategies discussed patient will not experience additional falls patient will verbalize understanding of plan for self management of stroke residual and fall safety patient will take all medications exactly as prescribed and will call provider for medication related questions patient will attend all scheduled medical appointments: Dr. Jerilee Hoh 05/15/21 patient will demonstrate improved adherence to prescribed treatment plan for self management of stroke residual and fall safety patient will verbalize basic understanding of self management of stroke residual and fall safety disease process and self  health management plan the patient will demonstrate ongoing self health care management ability Interventions:  Collaboration with Isaac Bliss, Rayford Halsted, MD regarding development and update of comprehensive plan of care as evidenced by provider attestation and co-signature Inter-disciplinary care team collaboration (see longitudinal plan of care) Provided written and verbal education re: Potential causes of falls and Fall prevention strategies Reviewed medications and discussed potential side effects of medications such as dizziness and frequent urination Assessed for s/s of orthostatic hypotension Assessed for falls since last encounter. Assessed patients knowledge of fall risk prevention secondary to previously provided education. Assessed working status of life alert bracelet and patient adherence Provided patient information for fall alert systems Evaluation of current treatment plan related to self management of stroke residual and fall safety and patient's adherence to plan as established by provider. Provided education to patient re: self management of stroke residual and fall safety Discussed plans with patient for ongoing care management follow up and provided patient with direct contact information for care management team Self-Care Deficits:  Unable to independently self management of stroke residual and fall safety Patient Goals:  - Utilize cane, scooter, wheelchair (assistive device) appropriately with all ambulation - De-clutter walkways - Change positions slowly - Wear secure fitting shoes at all times with ambulation - Utilize home lighting for dim lit areas - Demonstrate self and pet awareness at all times -  eat healthy to increase strength - increase activity or exercise time a little every week - join an activity or exercise group in community - know who to call for help if I fall - learn how to get up if I fall - plan activity for times when energy is the  highest Follow Up Plan: Telephone follow up appointment with care management team member scheduled for: 06/18/21 at 10 AM The patient has been provided with contact information for the care management team and has been advised to call with any health related questions or concerns.      Care Plan : Asthma (Adult)  Updates made by Dimitri Ped, RN since 06/18/2021 12:00 AM  Completed 06/18/2021   Problem: Symptom Exacerbation (Asthma) Resolved 06/18/2021  Priority: Medium     Long-Range Goal: Symptom Exacerbation Prevented or Minimized Completed 06/18/2021  Start Date: 05/14/2021  Expected End Date: 10/09/2021  Recent Progress: On track  Priority: Medium  Note:   Resolving due to duplicate goal Current Barriers:  Ineffective Self Health Maintenance in a patient with  asthma Unable to independently self manage asthma Clinical Goal(s):  Collaboration with Isaac Bliss, Rayford Halsted, MD regarding development and update of comprehensive plan of care as evidenced by provider attestation and co-signature Inter-disciplinary care team collaboration (see longitudinal plan of care) patient will work with care management team to address care coordination and chronic disease management needs related to Disease Management Educational Needs Care Coordination   Interventions:  Evaluation of current treatment plan related to  asthma , ADL IADL limitations self-management and patient's adherence to plan as established by provider. Collaboration with Isaac Bliss, Rayford Halsted, MD regarding development and update of comprehensive plan of care as evidenced by provider attestation       and co-signature Inter-disciplinary care team collaboration (see longitudinal plan of care) Discussed plans with patient for ongoing care management follow up and provided patient with direct contact information for care management team Self Care Activities:  Patient verbalizes understanding of plan to self manage  asthma Self administers medications as prescribed Attends all scheduled provider appointments Calls pharmacy for medication refills Calls provider office for new concerns or questions Patient Goals: - avoid symptom triggers outdoors - develop an asthma action plan - eliminate symptom triggers at home - keep follow-up appointments - keep rescue medicines on hand - use an extra pillow to sleep Follow Up Plan: Telephone follow up appointment with care management team member scheduled for:06/18/21 at 10 AM The patient has been provided with contact information for the care management team and has been advised to call with any health related questions or concerns.      Care Plan : RN Care Manager Plan of Care  Updates made by Dimitri Ped, RN since 06/18/2021 12:00 AM     Problem: Chronic Disease Management and Care Coordination Needs (HTN, stroke, asthma,DM2 and HLD)   Priority: High     Long-Range Goal: Establish Plan of Care for Chronic Disease Management Needs (HTN, stroke, asthma, DM2 and HLD)   Start Date: 06/18/2021  Expected End Date: 12/15/2021  This Visit's Progress: On track  Priority: High  Note:   Current Barriers:  Chronic Disease Management support and education needs related to HTN, HLD, DMII, Asthma, and hx stroke States she has been doing good.  States she has not had any falls this last month.  States she breathing has been good.  States her B/P is good when it is checked at appointments.  RNCM Clinical Goal(s):  Patient will verbalize understanding of plan for management of HTN, HLD, DMII, Asthma, and hx stroke verbalize basic understanding of  HTN, HLD, DMII, Asthma, and hx stroke disease process and self health management plan   take all medications exactly as prescribed and will call provider for medication related questions attend all scheduled medical appointments: Dr. Jerilee Hoh 08/15/21 demonstrate Ongoing adherence to prescribed treatment plan for HTN, HLD,  DMII, Asthma, and hx stroke as evidenced by readings within limits and adherence to plan of care continue to work with RN Care Manager to address care management and care coordination needs related to  HTN, HLD, DMII, Asthma, and hx stroke  through collaboration with Consulting civil engineer, provider, and care team.   Interventions: 1:1 collaboration with primary care provider regarding development and update of comprehensive plan of care as evidenced by provider attestation and co-signature Inter-disciplinary care team collaboration (see longitudinal plan of care) Evaluation of current treatment plan related to  self management and patient's adherence to plan as established by provider  Hypertension Interventions: Last practice recorded BP readings:  BP Readings from Last 3 Encounters:  06/07/21 126/81  05/15/21 120/78  04/23/21 (!) 148/83  Most recent eGFR/CrCl: No results found for: EGFR  No components found for: CRCL  Evaluation of current treatment plan related to hypertension self management and patient's adherence to plan as established by provider; Provided education to patient re: stroke prevention, s/s of heart attack and stroke; Reviewed medications with patient and discussed importance of compliance; Counseled on the importance of exercise goals with target of 150 minutes per week Discussed plans with patient for ongoing care management follow up and provided patient with direct contact information for care management team; Hyperlipidemia Interventions: Medication review performed; medication list updated in electronic medical record.  Provider established cholesterol goals reviewed; Counseled on importance of regular laboratory monitoring as prescribed; Reviewed importance of limiting foods high in cholesterol;  Falls Interventions: Advised patient of importance of notifying provider of falls; Assessed for falls since last encounter;   Diabetes Interventions: Assessed patient's  understanding of A1c goal: <7% Reviewed medications with patient and discussed importance of medication adherence; Counseled on importance of regular laboratory monitoring as prescribed; Discussed plans with patient for ongoing care management follow up and provided patient with direct contact information for care management team; Reviewed scheduled/upcoming provider appointments including: Dr. Jerilee Hoh 08/15/21; Review of patient status, including review of consultants reports, relevant laboratory and other test results, and medications completed; Reinforced importance of getting annual eye exam Lab Results  Component Value Date   HGBA1C 6.8 (H) 02/19/2021  Asthma: (Status:Goal on track:  Yes Advised patient to track and manage Asthma triggers;  Advised patient to self assesses Asthma action plan zone and make appointment with provider if in the yellow zone for 48 hours without improvement; Advised patient to engage in light exercise as tolerated 3-5 days a week to aid in the the management of Asthma; Provided education about and advised patient to utilize infection prevention strategies to reduce risk of respiratory infection;   Patient Goals/Self-Care Activities: Patient will self administer medications as prescribed Patient will attend all scheduled provider appointments Patient will call pharmacy for medication refills Patient will continue to perform ADL's independently Patient will continue to perform IADL's independently Patient will call provider office for new concerns or questions -eat healthy to increase strength - increase activity or exercise time a little every week- join an activity or exercise group in community - know  who to call for help if I fall - learn how to get up if I fall - plan activity for times when energy is the highest - schedule appointment with eye doctor change to whole grain breads, cereal, pasta - eat smaller or less servings of red meat - fill half  the plate with nonstarchy vegetables - get blood test (fasting) done 1 week before next visit - increase the amount of fiber in food - read food labels for fat and fiber - switch to low-fat or skim milk avoid symptom triggers outdoors - develop an asthma action plan - eliminate symptom triggers at home - keep follow-up appointments - keep rescue medicines on hand - use an extra pillow to sleep Follow Up Plan:  Telephone follow up appointment with care management team member scheduled for:  09/02/21  The patient has been provided with contact information for the care management team and has been advised to call with any health related questions or concerns.       Plan:Telephone follow up appointment with care management team member scheduled for:  09/02/21 The patient has been provided with contact information for the care management team and has been advised to call with any health related questions or concerns.  Peter Garter RN, Jackquline Denmark, CDE Care Management Coordinator [facility] [phone], Mobile [phone]

## 2021-06-24 ENCOUNTER — Other Ambulatory Visit: Payer: Self-pay | Admitting: Internal Medicine

## 2021-06-24 DIAGNOSIS — K219 Gastro-esophageal reflux disease without esophagitis: Secondary | ICD-10-CM

## 2021-06-25 ENCOUNTER — Telehealth: Payer: Self-pay

## 2021-06-25 DIAGNOSIS — K219 Gastro-esophageal reflux disease without esophagitis: Secondary | ICD-10-CM

## 2021-06-25 MED ORDER — PANTOPRAZOLE SODIUM 40 MG PO TBEC: 40.0000 mg | DELAYED_RELEASE_TABLET | Freq: Every day | ORAL | 1 refills | Status: DC | PRN

## 2021-06-25 NOTE — Telephone Encounter (Signed)
[facility] called and asked if Rx can be resent with a frequency   pantoprazole (PROTONIX) 40 MG tablet

## 2021-06-25 NOTE — Addendum Note (Signed)
Addended by: Westley Hummer B on: 06/25/2021 04:07 PM   Modules accepted: Orders

## 2021-06-25 NOTE — Telephone Encounter (Signed)
Refill sent with directions

## 2021-07-09 DIAGNOSIS — H52223 Regular astigmatism, bilateral: Secondary | ICD-10-CM | POA: Diagnosis not present

## 2021-07-10 DIAGNOSIS — E1169 Type 2 diabetes mellitus with other specified complication: Secondary | ICD-10-CM | POA: Diagnosis not present

## 2021-07-10 DIAGNOSIS — I10 Essential (primary) hypertension: Secondary | ICD-10-CM

## 2021-07-10 DIAGNOSIS — E7800 Pure hypercholesterolemia, unspecified: Secondary | ICD-10-CM

## 2021-07-10 DIAGNOSIS — J4530 Mild persistent asthma, uncomplicated: Secondary | ICD-10-CM | POA: Diagnosis not present

## 2021-07-26 ENCOUNTER — Encounter: Payer: Medicare HMO | Attending: Physical Medicine & Rehabilitation | Admitting: Physical Medicine & Rehabilitation

## 2021-07-26 ENCOUNTER — Other Ambulatory Visit: Payer: Self-pay

## 2021-07-26 ENCOUNTER — Encounter: Payer: Self-pay | Admitting: Physical Medicine & Rehabilitation

## 2021-07-26 VITALS — BP 116/78 | HR 80 | Temp 98.3°F | Ht 66.0 in | Wt 190.2 lb

## 2021-07-26 DIAGNOSIS — G8112 Spastic hemiplegia affecting left dominant side: Secondary | ICD-10-CM | POA: Diagnosis not present

## 2021-07-26 DIAGNOSIS — G8110 Spastic hemiplegia affecting unspecified side: Secondary | ICD-10-CM | POA: Diagnosis not present

## 2021-07-26 NOTE — Patient Instructions (Signed)

## 2021-07-26 NOTE — Progress Notes (Signed)
55 year old female with history of right CVA with left spastic hemiplegia.  She is modified independent with all self-care and mobility.  She continues to have issues with spasticity but these are well controlled with botulinum toxin injections.  Spasticity not controlled by exercise and stretching or oral meds .   Botox Injection for spasticity using needle EMG guidance  Dilution: 100 Units/ml Indication: Severe spasticity which interferes with ADL,mobility and/or  hygiene and is unresponsive to medication management and other conservative care Informed consent was obtained after describing risks and benefits of the procedure with the patient. This includes bleeding, bruising, infection, excessive weakness, or medication side effects. A REMS form is on file and signed. Needle: 17mm 25g needle electrode Number of units per muscle Total dose 200U  LEFT  Med gastroc 25U Lateral gastroc 25U Soleus 25 U FDL 75U FHL 25U Post tib 25U  All injections were done after obtaining appropriate EMG activity and after negative drawback for blood. The patient tolerated the procedure well. Post procedure instructions were given. A followup appointment was made.

## 2021-08-03 ENCOUNTER — Other Ambulatory Visit: Payer: Self-pay | Admitting: Internal Medicine

## 2021-08-03 DIAGNOSIS — I10 Essential (primary) hypertension: Secondary | ICD-10-CM

## 2021-08-15 ENCOUNTER — Ambulatory Visit (INDEPENDENT_AMBULATORY_CARE_PROVIDER_SITE_OTHER): Payer: Medicare HMO | Admitting: Internal Medicine

## 2021-08-15 ENCOUNTER — Encounter: Payer: Self-pay | Admitting: Internal Medicine

## 2021-08-15 VITALS — BP 120/74 | HR 91 | Temp 97.8°F | Ht 66.0 in | Wt 192.4 lb

## 2021-08-15 DIAGNOSIS — E7800 Pure hypercholesterolemia, unspecified: Secondary | ICD-10-CM

## 2021-08-15 DIAGNOSIS — G8110 Spastic hemiplegia affecting unspecified side: Secondary | ICD-10-CM

## 2021-08-15 DIAGNOSIS — E039 Hypothyroidism, unspecified: Secondary | ICD-10-CM

## 2021-08-15 DIAGNOSIS — E1149 Type 2 diabetes mellitus with other diabetic neurological complication: Secondary | ICD-10-CM

## 2021-08-15 DIAGNOSIS — I10 Essential (primary) hypertension: Secondary | ICD-10-CM

## 2021-08-15 LAB — POCT GLYCOSYLATED HEMOGLOBIN (HGB A1C): HbA1c, POC (prediabetic range): 6.4 % (ref 5.7–6.4)

## 2021-08-15 NOTE — Progress Notes (Signed)
Established Patient Office Visit     This visit occurred during the SARS-CoV-2 public health emergency.  Safety protocols were in place, including screening questions prior to the visit, additional usage of staff PPE, and extensive cleaning of exam room while observing appropriate contact time as indicated for disinfecting solutions.    CC/Reason for Visit: 47-monthfollow-up chronic medical conditions  HPI: Rebecca Heacoxis a 56y.o. female who is coming in today for the above mentioned reasons. Past Medical History is significant for: Hypertension, hyperlipidemia, type 2 diabetes, GERD, mild persistent asthma, she has a history of spastic hemiplegia following a right MCA CVA.  She was most recently diagnosed with hypothyroidism and started on levothyroxine 25 mcg daily.  She has been doing well and has no acute concerns or complaints.  She had her eye exam a few months ago.  She had her bivalent COVID booster in October.   Past Medical/Surgical History: Past Medical History:  Diagnosis Date   Anemia    ASTHMA 12/10/2009   Asthma    Cataract    CEREBROVASCULAR ACCIDENT, HX OF 59/11/7094  Complication of anesthesia    "just can't get me up" (06/09/2013)   Controlled type 2 diabetes mellitus without complication, without long-term current use of insulin (HCC)    no meds   Embolic stroke of right basal ganglia ([facility] 06/11/2015   with L residual weakness/hemiparesis    GERD (gastroesophageal reflux disease)    HYPERLIPIDEMIA 12/10/2009   HYPERTENSION 12/10/2009   PFO (patent foramen ovale)    Stroke ([facility] 2008   denies residual on 06/09/2013   Tendonitis of wrist, right     Past Surgical History:  Procedure Laterality Date   COLONOSCOPY     COSMETIC SURGERY     OVARIAN CYST REMOVAL  1990's   UPPER GASTROINTESTINAL ENDOSCOPY      Social History:  reports that she has never smoked. She has never used smokeless tobacco. She reports that she does not drink alcohol and does  not use drugs.  Allergies: Allergies  Allergen Reactions   Bactrim [Sulfamethoxazole-Trimethoprim] Nausea Only    Nausea    Penicillins Rash    Family History:  Family History  Problem Relation Age of Onset   Multiple myeloma Mother    Breast cancer Mother    Diabetes Mother    Diabetes Father    Breast cancer Maternal Aunt    Stomach cancer Maternal Aunt    Liver cancer Maternal Aunt    Prostate cancer Maternal Uncle    Colon cancer Neg Hx    Esophageal cancer Neg Hx    Rectal cancer Neg Hx      Current Outpatient Medications:    acetaminophen (TYLENOL) 325 MG tablet, Take 2 tablets (650 mg total) by mouth every 6 (six) hours as needed., Disp: 20 tablet, Rfl: 0   albuterol (VENTOLIN HFA) 108 (90 Base) MCG/ACT inhaler, Inhale 2 puffs into the lungs every 6 (six) hours as needed., Disp: 9 g, Rfl: 1   amLODipine (NORVASC) 10 MG tablet, Take 1 tablet by mouth once daily, Disp: 90 tablet, Rfl: 0   atorvastatin (LIPITOR) 40 MG tablet, Take 1 tablet by mouth once daily, Disp: 90 tablet, Rfl: 0   baclofen (LIORESAL) 10 MG tablet, TAKE 1 TABLET BY MOUTH TWICE DAILY AS NEEDED FOR MUSCLE SPASM, Disp: 60 tablet, Rfl: 0   carvedilol (COREG) 3.125 MG tablet, TAKE 1 TABLET BY MOUTH TWICE DAILY WITH MEALS, Disp: 180 tablet,  Rfl: 0   clopidogrel (PLAVIX) 75 MG tablet, Take 1 tablet by mouth once daily, Disp: 90 tablet, Rfl: 1   diphenhydrAMINE (BENADRYL) 25 MG tablet, Take 50 mg by mouth at bedtime as needed. , Disp: , Rfl:    fluticasone (FLOVENT HFA) 110 MCG/ACT inhaler, Inhale 2 puffs into the lungs in the morning and at bedtime., Disp: 1 each, Rfl: 12   levothyroxine (SYNTHROID) 25 MCG tablet, Take 1 tablet (25 mcg total) by mouth daily., Disp: 90 tablet, Rfl: 1   losartan (COZAAR) 50 MG tablet, Take 1 tablet by mouth twice daily, Disp: 180 tablet, Rfl: 1   Multiple Vitamin (MULTIVITAMIN WITH MINERALS) TABS tablet, Take 1 tablet by mouth daily., Disp: , Rfl:    pantoprazole (PROTONIX) 40  MG tablet, Take 1 tablet (40 mg total) by mouth daily as needed., Disp: 90 tablet, Rfl: 1   polyethylene glycol (MIRALAX / GLYCOLAX) packet, Take 17 g by mouth daily. (Patient taking differently: Take 17 g by mouth daily as needed.), Disp: 30 each, Rfl: 1  Review of Systems:  Constitutional: Denies fever, chills, diaphoresis, appetite change and fatigue.  HEENT: Denies photophobia, eye pain, redness, hearing loss, ear pain, congestion, sore throat, rhinorrhea, sneezing, mouth sores, trouble swallowing, neck pain, neck stiffness and tinnitus.   Respiratory: Denies SOB, DOE, cough, chest tightness,  and wheezing.   Cardiovascular: Denies chest pain, palpitations and leg swelling.  Gastrointestinal: Denies nausea, vomiting, abdominal pain, diarrhea, constipation, blood in stool and abdominal distention.  Genitourinary: Denies dysuria, urgency, frequency, hematuria, flank pain and difficulty urinating.  Endocrine: Denies: hot or cold intolerance, sweats, changes in hair or nails, polyuria, polydipsia. Musculoskeletal: Denies myalgias, back pain, joint swelling, arthralgias and gait problem.  Skin: Denies pallor, rash and wound.  Neurological: Denies dizziness, seizures, syncope, weakness, light-headedness, numbness and headaches.  Hematological: Denies adenopathy. Easy bruising, personal or family bleeding history  Psychiatric/Behavioral: Denies suicidal ideation, mood changes, confusion, nervousness, sleep disturbance and agitation    Physical Exam: Vitals:   08/15/21 1243  BP: 120/74  Pulse: 91  Temp: 97.8 F (36.6 C)  TempSrc: Oral  SpO2: 98%  Weight: 192 lb 7 oz (87.3 kg)  Height: _0  (1.676 m)    Body mass index is 31.06 kg/m.   Constitutional: NAD, calm, comfortable Eyes: PERRL, lids and conjunctivae normal, wears corrective lenses ENMT: Mucous membranes are moist.  Respiratory: clear to auscultation bilaterally, no wheezing, no crackles. Normal respiratory effort. No  accessory muscle use.  Cardiovascular: Regular rate and rhythm, no murmurs / rubs / gallops. No extremity edema.  Psychiatric: Normal judgment and insight. Alert and oriented x 3. Normal mood.    Impression and Plan:  Controlled type 2 diabetes mellitus with other neurologic complication, without long-term current use of insulin ([place])  - Plan: POC HgB A1c -Lengthy today 6.  4.  Acquired hypothyroidism -Her right TSH was 3.30 in October, she is on levothyroxine 25 mcg daily.  Spastic hemiplegia affecting nondominant side (HCC) -Following CVA, she follows with PM&R and gets Botox injections.  Pure hypercholesterolemia -Lipid panel in July 2020 correct goal return protocol) 114, triglycerides 103 and LDL 49. -She is on atorvastatin.  Essential hypertension -Well-controlled  Time spent: 31 minutes reviewing chart, interviewing and examining patient and formulating plan of care.   Patient Instructions  -Nice seeing you today!!  -Schedule follow up in 3 months.  -Remember to get your shingles vaccine at the pharmacy.    Lelon Frohlich, MD [facility]  at [facility]

## 2021-08-15 NOTE — Patient Instructions (Signed)
-  Nice seeing you today!!  -Schedule follow up in 3 months.  -Remember to get your shingles vaccine at the pharmacy.

## 2021-08-17 ENCOUNTER — Other Ambulatory Visit: Payer: Self-pay | Admitting: Internal Medicine

## 2021-08-17 DIAGNOSIS — E119 Type 2 diabetes mellitus without complications: Secondary | ICD-10-CM

## 2021-08-26 ENCOUNTER — Other Ambulatory Visit: Payer: Self-pay | Admitting: Internal Medicine

## 2021-09-02 ENCOUNTER — Ambulatory Visit (INDEPENDENT_AMBULATORY_CARE_PROVIDER_SITE_OTHER): Payer: Medicare HMO

## 2021-09-02 DIAGNOSIS — J4530 Mild persistent asthma, uncomplicated: Secondary | ICD-10-CM

## 2021-09-02 DIAGNOSIS — E7800 Pure hypercholesterolemia, unspecified: Secondary | ICD-10-CM

## 2021-09-02 DIAGNOSIS — G8110 Spastic hemiplegia affecting unspecified side: Secondary | ICD-10-CM

## 2021-09-02 DIAGNOSIS — I10 Essential (primary) hypertension: Secondary | ICD-10-CM

## 2021-09-02 DIAGNOSIS — E1149 Type 2 diabetes mellitus with other diabetic neurological complication: Secondary | ICD-10-CM

## 2021-09-02 NOTE — Chronic Care Management (AMB) (Signed)
Chronic Care Management   CCM RN Visit Note  09/02/2021 Name: Rebecca Yoder MRN: [id number] DOB: [birth year]  Subjective: Rebecca Yoder is a 56 y.o. year old female who is a primary care patient of Isaac Bliss, Rayford Halsted, MD. The care management team was consulted for assistance with disease management and care coordination needs.    Engaged with patient by telephone for follow up visit in response to provider referral for case management and/or care coordination services.   Consent to Services:  The patient was given information about Chronic Care Management services, agreed to services, and gave verbal consent prior to initiation of services.  Please see initial visit note for detailed documentation.   Patient agreed to services and verbal consent obtained.   Assessment: Review of patient past medical history, allergies, medications, health status, including review of consultants reports, laboratory and other test data, was performed as part of comprehensive evaluation and provision of chronic care management services.   SDOH (Social Determinants of Health) assessments and interventions performed:    CCM Care Plan  Allergies  Allergen Reactions   Bactrim [Sulfamethoxazole-Trimethoprim] Nausea Only    Nausea    Penicillins Rash    Outpatient Encounter Medications as of 09/02/2021  Medication Sig   acetaminophen (TYLENOL) 325 MG tablet Take 2 tablets (650 mg total) by mouth every 6 (six) hours as needed.   albuterol (VENTOLIN HFA) 108 (90 Base) MCG/ACT inhaler Inhale 2 puffs into the lungs every 6 (six) hours as needed.   amLODipine (NORVASC) 10 MG tablet Take 1 tablet by mouth once daily   atorvastatin (LIPITOR) 40 MG tablet Take 1 tablet (40 mg total) by mouth daily.   baclofen (LIORESAL) 10 MG tablet TAKE 1 TABLET BY MOUTH TWICE DAILY AS NEEDED FOR MUSCLE SPASM   carvedilol (COREG) 3.125 MG tablet TAKE 1 TABLET BY MOUTH TWICE DAILY WITH MEALS   clopidogrel  (PLAVIX) 75 MG tablet Take 1 tablet by mouth once daily   diphenhydrAMINE (BENADRYL) 25 MG tablet Take 50 mg by mouth at bedtime as needed.    fluticasone (FLOVENT HFA) 110 MCG/ACT inhaler Inhale 2 puffs into the lungs in the morning and at bedtime.   levothyroxine (SYNTHROID) 25 MCG tablet Take 1 tablet (25 mcg total) by mouth daily.   losartan (COZAAR) 50 MG tablet Take 1 tablet by mouth twice daily   Multiple Vitamin (MULTIVITAMIN WITH MINERALS) TABS tablet Take 1 tablet by mouth daily.   pantoprazole (PROTONIX) 40 MG tablet Take 1 tablet (40 mg total) by mouth daily as needed.   polyethylene glycol (MIRALAX / GLYCOLAX) packet Take 17 g by mouth daily. (Patient taking differently: Take 17 g by mouth daily as needed.)   No facility-administered encounter medications on file as of 09/02/2021.    Patient Active Problem List   Diagnosis Date Noted   Hypothyroidism 02/20/2021   GERD (gastroesophageal reflux disease) 03/18/2016   Spastic hemiplegia affecting nondominant side ([place]) 11/02/2015   Gait disturbance, post-stroke 08/14/2015   Frozen shoulder syndrome 08/14/2015   Subacromial impingement of left shoulder 08/14/2015   Left shoulder pain 08/10/2015   Diabetes type 2, controlled ([place]) 07/31/2015   Eczema 07/31/2015   Right middle cerebral artery stroke ([place]) 06/14/2015   Left hemiparesis ([place])    Left-sided neglect    Embolic stroke of right basal ganglia ([place]) 06/11/2015   TIA (transient ischemic attack) 06/09/2013   PFO (patent foramen ovale) 06/09/2013   Hyperlipidemia 12/10/2009   Essential hypertension 12/10/2009   Mild  persistent asthma 12/10/2009   History of cardiovascular disorder 12/10/2009    Conditions to be addressed/monitored:HTN, HLD, DMII, Asthma, and hx CVA  Care Plan : RN Care Manager Plan of Care  Updates made by Dimitri Ped, RN since 09/02/2021 12:00 AM     Problem: Chronic Disease Management and Care Coordination Needs (HTN, stroke, asthma,DM2 and  HLD)   Priority: High     Long-Range Goal: Establish Plan of Care for Chronic Disease Management Needs (HTN, stroke, asthma, DM2 and HLD)   Start Date: 06/18/2021  Expected End Date: 12/15/2021  Recent Progress: On track  Priority: High  Note:   Current Barriers:  Chronic Disease Management support and education needs related to HTN, HLD, DMII, Asthma, and hx stroke States she has been doing good.  States she has not had any falls this last month. States that the Botox injections help. States she tries to eat healthy States she breathing has been good.  States her B/P is good when it is checked at appointments. States she rides her exercise bike 3-4 times a week.  States she had her eye exam in November.  RNCM Clinical Goal(s):  Patient will verbalize understanding of plan for management of HTN, HLD, DMII, Asthma, and hx stroke as evidenced by voiced adherence to plan of care verbalize basic understanding of  HTN, HLD, DMII, Asthma, and hx stroke disease process and self health management plan as evidenced by readings within limits, voiced adherence to plan of care take all medications exactly as prescribed and will call provider for medication related questions as evidenced by dispense report and pt verbalization attend all scheduled medical appointments: Pain/rehab 09/12/21,Dr. Jerilee Hoh 11/13/21 as evidenced by medical records demonstrate Ongoing adherence to prescribed treatment plan for HTN, HLD, DMII, Asthma, and hx stroke as evidenced by readings within limits and adherence to plan of care continue to work with RN Care Manager to address care management and care coordination needs related to  HTN, HLD, DMII, Asthma, and hx stroke as evidenced by adherence to CM Team Scheduled appointments through collaboration with RN Care manager, provider, and care team.   Interventions: 1:1 collaboration with primary care provider regarding development and update of comprehensive plan of care as evidenced  by provider attestation and co-signature Inter-disciplinary care team collaboration (see longitudinal plan of care) Evaluation of current treatment plan related to  self management and patient's adherence to plan as established by provider   Asthma: (Status:Goal on track:  Yes.) Long Term Goal Advised patient to track and manage Asthma triggers Advised patient to self assesses Asthma action plan zone and make appointment with provider if in the yellow zone for 48 hours without improvement Advised patient to engage in light exercise as tolerated 3-5 days a week to aid in the the management of Asthma Provided education about and advised patient to utilize infection prevention strategies to reduce risk of respiratory infection   Diabetes Interventions:  (Status:  Goal on track:  Yes.) Long Term Goal Assessed patient's understanding of A1c goal: <7% Provided education to patient about basic DM disease process Reviewed medications with patient and discussed importance of medication adherence Counseled on importance of regular laboratory monitoring as prescribed Reviewed importance of regular exercise to help keep blood sugar lower Lab Results  Component Value Date   HGBA1C 6.4 08/15/2021   Falls Interventions:  (Status:  Goal on track:  Yes.) Long Term Goal Advised patient of importance of notifying provider of falls Assessed for falls since last encounter  Assessed patients knowledge of fall risk prevention secondary to previously provided education   Hyperlipidemia Interventions:  (Status:  Goal on track:  Yes.) Long Term Goal Medication review performed; medication list updated in electronic medical record.  Provider established cholesterol goals reviewed Counseled on importance of regular laboratory monitoring as prescribed Reviewed role and benefits of statin for ASCVD risk reduction Reviewed exercise goals and target of 150 minutes per week  Hypertension Interventions:  (Status:   Goal on track:  Yes.) Long Term Goal Last practice recorded BP readings:  BP Readings from Last 3 Encounters:  08/15/21 120/74  07/26/21 116/78  06/07/21 126/81  Most recent eGFR/CrCl: No results found for: EGFR  No components found for: CRCL  Evaluation of current treatment plan related to hypertension self management and patient's adherence to plan as established by provider Provided education to patient re: stroke prevention, s/s of heart attack and stroke Counseled on the importance of exercise goals with target of 150 minutes per week Advised patient, providing education and rationale, to monitor blood pressure daily and record, calling PCP for findings outside established parameters Provided education on prescribed diet low sodium low CHO  Discussed complications of poorly controlled blood pressure such as heart disease, stroke, circulatory complications, vision complications, kidney impairment, sexual dysfunction  Stroke:  (Status:Goal on track:  Yes.) Long Term Goal Reviewed Importance of taking all medications as prescribed Advised to report any changes in symptoms or exercise tolerance Assessed for signs and symptoms of stroke Reviewed the importance of exercise Reviewed to keep appointments for Botox injections     Patient Goals/Self-Care Activities: Take all medications as prescribed Attend all scheduled provider appointments Call pharmacy for medication refills 3-7 days in advance of running out of medications Call provider office for new concerns or questions  keep appointment with eye doctor check feet daily for cuts, sores or redness drink 6 to 8 glasses of water each day fill half of plate with vegetables manage portion size keep feet up while sitting wash and dry feet carefully every day check blood pressure weekly choose a place to take my blood pressure (home, clinic or office, retail store) take blood pressure log to all doctor appointments eat more whole  grains, fruits and vegetables, lean meats and healthy fats limit salt intake to 2380m/day call for medicine refill 2 or 3 days before it runs out take all medications exactly as prescribed call doctor with any symptoms you believe are related to your medicine -avoid symptom triggers outdoors - develop an asthma action plan - eliminate symptom triggers at home - keep follow-up appointments - keep rescue medicines on hand - use an extra pillow to sleep Follow Up Plan:  Telephone follow up appointment with care management team member scheduled for:  12/23/21  The patient has been provided with contact information for the care management team and has been advised to call with any health related questions or concerns.       Plan:Telephone follow up appointment with care management team member scheduled for:  12/23/21 The patient has been provided with contact information for the care management team and has been advised to call with any health related questions or concerns.  MPeter GarterRN, BJackquline Denmark CDE Care Management Coordinator [facility] ([phone] Mobile ([phone]

## 2021-09-02 NOTE — Patient Instructions (Signed)
Visit Information  Thank you for taking time to visit with me today. Please don't hesitate to contact me if I can be of assistance to you before our next scheduled telephone appointment.  Following are the goals we discussed today:  Take all medications as prescribed Attend all scheduled provider appointments Call pharmacy for medication refills 3-7 days in advance of running out of medications Call provider office for new concerns or questions  keep appointment with eye doctor check feet daily for cuts, sores or redness drink 6 to 8 glasses of water each day fill half of plate with vegetables manage portion size keep feet up while sitting wash and dry feet carefully every day check blood pressure weekly choose a place to take my blood pressure (home, clinic or office, retail store) take blood pressure log to all doctor appointments eat more whole grains, fruits and vegetables, lean meats and healthy fats limit salt intake to 2300mg /day call for medicine refill 2 or 3 days before it runs out take all medications exactly as prescribed call doctor with any symptoms you believe are related to your medicine -avoid symptom triggers outdoors - develop an asthma action plan - eliminate symptom triggers at home - keep follow-up appointments - keep rescue medicines on hand - use an extra pillow to sleep  Our next appointment is by telephone on 12/23/21 at 10 AM  Please call the care guide team at [phone] if you need to cancel or reschedule your appointment.   If you are experiencing a Mental Health or [place] or need someone to talk to, please call the Suicide and Crisis Lifeline: 988 call the [place] National Suicide Prevention Lifeline: (754)723-8316 or TTY: 905-625-8224 TTY (678) 706-4251) to talk to a trained counselor call 1-800-273-TALK (toll free, 24 hour hotline) go to [facility] [address], [place]  [phone]) call 911   The patient verbalized understanding of instructions, educational materials, and care plan provided today and agreed to receive a mailed copy of patient instructions, educational materials, and care plan.   Peter Garter RN, Jackquline Denmark, CDE Care Management Coordinator [facility] [phone], Mobile [phone]

## 2021-09-10 DIAGNOSIS — J4530 Mild persistent asthma, uncomplicated: Secondary | ICD-10-CM

## 2021-09-10 DIAGNOSIS — E1149 Type 2 diabetes mellitus with other diabetic neurological complication: Secondary | ICD-10-CM

## 2021-09-10 DIAGNOSIS — E7800 Pure hypercholesterolemia, unspecified: Secondary | ICD-10-CM

## 2021-09-10 DIAGNOSIS — I10 Essential (primary) hypertension: Secondary | ICD-10-CM | POA: Diagnosis not present

## 2021-09-12 ENCOUNTER — Encounter: Payer: Medicare HMO | Attending: Physical Medicine & Rehabilitation | Admitting: Physical Medicine & Rehabilitation

## 2021-09-12 ENCOUNTER — Other Ambulatory Visit: Payer: Self-pay

## 2021-09-12 ENCOUNTER — Encounter: Payer: Self-pay | Admitting: Physical Medicine & Rehabilitation

## 2021-09-12 VITALS — BP 133/82 | HR 85 | Ht 66.0 in | Wt 195.0 lb

## 2021-09-12 DIAGNOSIS — G8110 Spastic hemiplegia affecting unspecified side: Secondary | ICD-10-CM | POA: Diagnosis not present

## 2021-09-12 NOTE — Progress Notes (Signed)
Subjective:    Patient ID: Rebecca Yoder, female    DOB: Aug 11, 1966, 56 y.o.   MRN: [id number]  HPI 56 yo female with chronic Left spastic hemiplegia due to Righ tCVA Developing Right ankle pain x 1 wk  LEFT  Med gastroc 25U Lateral gastroc 25U Soleus 25 U FDL 75U FHL 25U Post tib 25U Pain Inventory Average Pain 7 Pain Right Now 0 My pain is intermittent and aching  In the last 24 hours, has pain interfered with the following? General activity 0 Relation with others 0 Enjoyment of life 0 What TIME of day is your pain at its worst? varies Sleep (in general) Good  Pain is worse with: sitting Pain improves with: therapy/exercise Relief from Meds:  na  Family History  Problem Relation Age of Onset   Multiple myeloma Mother    Breast cancer Mother    Diabetes Mother    Diabetes Father    Breast cancer Maternal Aunt    Stomach cancer Maternal Aunt    Liver cancer Maternal Aunt    Prostate cancer Maternal Uncle    Colon cancer Neg Hx    Esophageal cancer Neg Hx    Rectal cancer Neg Hx    Social History   Socioeconomic History   Marital status: Single    Spouse name: Not on file   Number of children: 0   Years of education: 12th   Highest education level: Not on file  Occupational History   Occupation: budd    Fish farm manager: [employer]  Tobacco Use   Smoking status: Never   Smokeless tobacco: Never  Vaping Use   Vaping Use: Never used  Substance and Sexual Activity   Alcohol use: No    Alcohol/week: 0.0 standard drinks   Drug use: No   Sexual activity: Yes  Other Topics Concern   Not on file  Social History Narrative   Not on file   Social Determinants of Health   Financial Resource Strain: Not on file  Food Insecurity: No Food Insecurity   Worried About Running Out of Food in the Last Year: Never true   Ran Out of Food in the Last Year: Never true  Transportation Needs: No Transportation Needs   Lack of Transportation (Medical): No   Lack  of Transportation (Non-Medical): No  Physical Activity: Sufficiently Active   Days of Exercise per Week: 5 days   Minutes of Exercise per Session: 60 min  Stress: No Stress Concern Present   Feeling of Stress : Not at all  Social Connections: Not on file   Past Surgical History:  Procedure Laterality Date   COLONOSCOPY     COSMETIC SURGERY     OVARIAN CYST REMOVAL  1990's   UPPER GASTROINTESTINAL ENDOSCOPY     Past Surgical History:  Procedure Laterality Date   COLONOSCOPY     COSMETIC SURGERY     OVARIAN CYST REMOVAL  1990's   UPPER GASTROINTESTINAL ENDOSCOPY     Past Medical History:  Diagnosis Date   Anemia    ASTHMA 12/10/2009   Asthma    Cataract    CEREBROVASCULAR ACCIDENT, HX OF 03/14/6961   Complication of anesthesia    "just can't get me up" (06/09/2013)   Controlled type 2 diabetes mellitus without complication, without long-term current use of insulin (HCC)    no meds   Embolic stroke of right basal ganglia ([place]) 06/11/2015   with L residual weakness/hemiparesis    GERD (gastroesophageal reflux disease)  HYPERLIPIDEMIA 12/10/2009   HYPERTENSION 12/10/2009   PFO (patent foramen ovale)    Stroke [facility]) 2008   denies residual on 06/09/2013   Tendonitis of wrist, right    BP 133/82    Pulse 85    Ht _0  (1.676 m)    Wt 195 lb (88.5 kg)    SpO2 97%    BMI 31.47 kg/m   Opioid Risk Score:   Fall Risk Score:  `1  Depression screen PHQ 2/9  Depression screen [facility] 2/9 09/12/2021 08/15/2021 07/26/2021 06/07/2021 05/14/2021 02/19/2021 01/15/2021  Decreased Interest 0 3 0 0 0 0 0  Down, Depressed, Hopeless 0 3 0 0 0 0 0  PHQ - 2 Score 0 6 0 0 0 0 0  Altered sleeping - 2 - - - 0 -  Tired, decreased energy - 0 - - - 0 -  Change in appetite - 3 - - - 0 -  Feeling bad or failure about yourself  - 0 - - - 0 -  Trouble concentrating - 3 - - - 0 -  Moving slowly or fidgety/restless - 0 - - - 0 -  Suicidal thoughts - 0 - - - 0 -  PHQ-9 Score - 14 - - - 0 -  Difficult doing  work/chores - Somewhat difficult - - - Not difficult at all -  Some recent data might be hidden     Review of Systems  Musculoskeletal:        Leg pain  Neurological:  Positive for weakness.  All other systems reviewed and are negative.     Objective:   Physical Exam Vitals and nursing note reviewed.  Constitutional:      Appearance: She is obese.  HENT:     Head: Normocephalic and atraumatic.  Eyes:     Extraocular Movements: Extraocular movements intact.     Conjunctiva/sclera: Conjunctivae normal.     Pupils: Pupils are equal, round, and reactive to light.  Neurological:     Mental Status: She is alert and oriented to person, place, and time.     Cranial Nerves: No dysarthria.     Motor: Weakness and abnormal muscle tone present.     Coordination: Coordination abnormal.     Gait: Gait abnormal.     Comments: 3-/5 Strength left delt, Biceps, triceps, grip 3- HF KE, ADF  Tone MAS 1 in ankle PF, toe flexors , MAS 2 foot inversion          Assessment & Plan:   R CVA with Left HP responding well to botulinum toxin injection.  She continues to do home exercise program including stretching and strengthening.  She is modified independent with all self-care and mobility. LEFT  Med gastroc 25U Lateral gastroc 25U Soleus 25 U FDL 75U FHL 25U Post tib 25U  2.  Right ankle pain mainly anterior I believe she is having some ankle impingement.  This has only been going on 1 week.  We discussed reducing the depth of her squats especially on the right side.  No falls or trauma I do not think x-ray needed at this time.  If this persists may need to check films to see if she has any osteoarthritis.

## 2021-09-21 ENCOUNTER — Other Ambulatory Visit: Payer: Self-pay | Admitting: Internal Medicine

## 2021-09-21 DIAGNOSIS — J4540 Moderate persistent asthma, uncomplicated: Secondary | ICD-10-CM

## 2021-09-21 DIAGNOSIS — I63511 Cerebral infarction due to unspecified occlusion or stenosis of right middle cerebral artery: Secondary | ICD-10-CM

## 2021-09-21 DIAGNOSIS — I10 Essential (primary) hypertension: Secondary | ICD-10-CM

## 2021-10-24 ENCOUNTER — Encounter: Payer: Self-pay | Admitting: Internal Medicine

## 2021-10-24 ENCOUNTER — Ambulatory Visit (INDEPENDENT_AMBULATORY_CARE_PROVIDER_SITE_OTHER): Payer: Medicare HMO | Admitting: Internal Medicine

## 2021-10-24 VITALS — BP 130/80 | HR 90 | Temp 98.3°F | Wt 195.0 lb

## 2021-10-24 DIAGNOSIS — M549 Dorsalgia, unspecified: Secondary | ICD-10-CM

## 2021-10-24 NOTE — Progress Notes (Signed)
? ? ? ?Acute office Visit ? ? ? ? ?This visit occurred during the SARS-CoV-2 public health emergency.  Safety protocols were in place, including screening questions prior to the visit, additional usage of staff PPE, and extensive cleaning of exam room while observing appropriate contact time as indicated for disinfecting solutions.  ? ? ?CC/Reason for Visit: Upper back pain ? ?HPI: Rebecca Yoder is a 56 y.o. female who is coming in today for the above mentioned reasons.  She states she woke up 2 days ago with pain in her right upper back.  She does not recall any injury.  She has been applying IcyHot patches and it is improved.  She just wanted to come in to make me aware. ? ?Past Medical/Surgical History: ?Past Medical History:  ?Diagnosis Date  ? Anemia   ? ASTHMA 12/10/2009  ? Asthma   ? Cataract   ? CEREBROVASCULAR ACCIDENT, HX OF 12/10/2009  ? Complication of anesthesia   ? "just can't get me up" (06/09/2013)  ? Controlled type 2 diabetes mellitus without complication, without long-term current use of insulin ([place])   ? no meds  ? Embolic stroke of right basal ganglia ([place]) 06/11/2015  ? with L residual weakness/hemiparesis   ? GERD (gastroesophageal reflux disease)   ? HYPERLIPIDEMIA 12/10/2009  ? HYPERTENSION 12/10/2009  ? PFO (patent foramen ovale)   ? Stroke [facility]) 2008  ? denies residual on 06/09/2013  ? Tendonitis of wrist, right   ? ? ?Past Surgical History:  ?Procedure Laterality Date  ? COLONOSCOPY    ? COSMETIC SURGERY    ? OVARIAN CYST REMOVAL  1990's  ? UPPER GASTROINTESTINAL ENDOSCOPY    ? ? ?Social History: ? reports that she has never smoked. She has never used smokeless tobacco. She reports that she does not drink alcohol and does not use drugs. ? ?Allergies: ?Allergies  ?Allergen Reactions  ? Bactrim [Sulfamethoxazole-Trimethoprim] Nausea Only  ?  Nausea   ? Penicillins Rash  ? ? ?Family History:  ?Family History  ?Problem Relation Age of Onset  ? Multiple myeloma Mother   ? Breast cancer Mother    ? Diabetes Mother   ? Diabetes Father   ? Breast cancer Maternal Aunt   ? Stomach cancer Maternal Aunt   ? Liver cancer Maternal Aunt   ? Prostate cancer Maternal Uncle   ? Colon cancer Neg Hx   ? Esophageal cancer Neg Hx   ? Rectal cancer Neg Hx   ? ? ? ?Current Outpatient Medications:  ?  acetaminophen (TYLENOL) 325 MG tablet, Take 2 tablets (650 mg total) by mouth every 6 (six) hours as needed., Disp: 20 tablet, Rfl: 0 ?  albuterol (VENTOLIN HFA) 108 (90 Base) MCG/ACT inhaler, INHALE 2 PUFFS BY MOUTH EVERY 6 HOURS AS NEEDED, Disp: 9 g, Rfl: 1 ?  amLODipine (NORVASC) 10 MG tablet, Take 1 tablet by mouth once daily, Disp: 90 tablet, Rfl: 0 ?  atorvastatin (LIPITOR) 40 MG tablet, Take 1 tablet (40 mg total) by mouth daily., Disp: 90 tablet, Rfl: 1 ?  baclofen (LIORESAL) 10 MG tablet, TAKE 1 TABLET BY MOUTH TWICE DAILY AS NEEDED FOR MUSCLE SPASM, Disp: 60 tablet, Rfl: 0 ?  carvedilol (COREG) 3.125 MG tablet, TAKE 1 TABLET BY MOUTH TWICE DAILY WITH MEALS, Disp: 180 tablet, Rfl: 1 ?  clopidogrel (PLAVIX) 75 MG tablet, Take 1 tablet by mouth once daily, Disp: 90 tablet, Rfl: 1 ?  diphenhydrAMINE (BENADRYL) 25 MG tablet, Take 50 mg by mouth  at bedtime as needed. , Disp: , Rfl:  ?  fluticasone (FLOVENT HFA) 110 MCG/ACT inhaler, Inhale 2 puffs into the lungs in the morning and at bedtime., Disp: 1 each, Rfl: 12 ?  levothyroxine (SYNTHROID) 25 MCG tablet, Take 1 tablet (25 mcg total) by mouth daily., Disp: 90 tablet, Rfl: 1 ?  losartan (COZAAR) 50 MG tablet, Take 1 tablet by mouth twice daily, Disp: 180 tablet, Rfl: 0 ?  Multiple Vitamin (MULTIVITAMIN WITH MINERALS) TABS tablet, Take 1 tablet by mouth daily., Disp: , Rfl:  ?  pantoprazole (PROTONIX) 40 MG tablet, Take 1 tablet (40 mg total) by mouth daily as needed., Disp: 90 tablet, Rfl: 1 ?  polyethylene glycol (MIRALAX / GLYCOLAX) packet, Take 17 g by mouth daily. (Patient taking differently: Take 17 g by mouth daily as needed.), Disp: 30 each, Rfl: 1 ? ?Review of  Systems:  ?Constitutional: Denies fever, chills, diaphoresis, appetite change and fatigue.  ?HEENT: Denies photophobia, eye pain, redness, hearing loss, ear pain, congestion, sore throat, rhinorrhea, sneezing, mouth sores, trouble swallowing, neck pain, neck stiffness and tinnitus.   ?Respiratory: Denies SOB, DOE, cough, chest tightness,  and wheezing.   ?Cardiovascular: Denies chest pain, palpitations and leg swelling.  ?Gastrointestinal: Denies nausea, vomiting, abdominal pain, diarrhea, constipation, blood in stool and abdominal distention.  ?Genitourinary: Denies dysuria, urgency, frequency, hematuria, flank pain and difficulty urinating.  ?Endocrine: Denies: hot or cold intolerance, sweats, changes in hair or nails, polyuria, polydipsia. ?Musculoskeletal: Denies , joint swelling ?Skin: Denies pallor, rash and wound.  ?Neurological: Denies dizziness, seizures, syncope, weakness, light-headedness, numbness and headaches.  ?Hematological: Denies adenopathy. Easy bruising, personal or family bleeding history  ?Psychiatric/Behavioral: Denies suicidal ideation, mood changes, confusion, nervousness, sleep disturbance and agitation ? ? ? ?Physical Exam: ?Vitals:  ? 10/24/21 1042  ?BP: 130/80  ?Pulse: 90  ?Temp: 98.3 ?F (36.8 ?C)  ?TempSrc: Oral  ?SpO2: 97%  ?Weight: 195 lb (88.5 kg)  ? ? ?Body mass index is 31.47 kg/m?. ? ? ?Constitutional: NAD, calm, comfortable ?Eyes: PERRL, lids and conjunctivae normal, wears corrective lenses ?ENMT: Mucous membranes are moist.  ?Musculoskeletal: Pain to palpation of her right upper back. ?Psychiatric: Normal judgment and insight. Alert and oriented x 3. Normal mood.  ? ? ?Impression and Plan: ? ?Upper back pain ?-Suspect musculoskeletal in origin, since already improving I have only advised as needed acetaminophen, heating pads and she may continue to use OTC creams such as IcyHot. ? ?Time spent: 20 minutes reviewing chart, interviewing and examining patient and formulating plan of  care. ? ? ? ? ? ?Lelon Frohlich, MD ?[facility] at [facility] ? ? ?

## 2021-10-25 ENCOUNTER — Encounter: Payer: Self-pay | Admitting: Physical Medicine & Rehabilitation

## 2021-10-25 ENCOUNTER — Encounter: Payer: Medicare HMO | Attending: Physical Medicine & Rehabilitation | Admitting: Physical Medicine & Rehabilitation

## 2021-10-25 ENCOUNTER — Other Ambulatory Visit: Payer: Self-pay

## 2021-10-25 VITALS — BP 134/82 | HR 100 | Temp 98.7°F | Ht 66.0 in | Wt 198.6 lb

## 2021-10-25 DIAGNOSIS — G8114 Spastic hemiplegia affecting left nondominant side: Secondary | ICD-10-CM

## 2021-10-25 DIAGNOSIS — G8110 Spastic hemiplegia affecting unspecified side: Secondary | ICD-10-CM

## 2021-10-25 DIAGNOSIS — R6889 Other general symptoms and signs: Secondary | ICD-10-CM | POA: Diagnosis not present

## 2021-10-25 NOTE — Patient Instructions (Signed)

## 2021-10-25 NOTE — Progress Notes (Signed)
Botox Injection for spasticity using needle EMG guidance ? ?Dilution: 50 Units/ml ?Indication: Severe spasticity which interferes with ADL,mobility and/or  hygiene and is unresponsive to medication management and other conservative care ?Informed consent was obtained after describing risks and benefits of the procedure with the patient. This includes bleeding, bruising, infection, excessive weakness, or medication side effects. A REMS form is on file and signed. ?Needle: 25g 2" needle electrode ?Number of units per muscle ?LEFT  ?Med gastroc 25U ?Lateral gastroc 25U ?Soleus 25 U ?FDL 75U ?FHL 25U ?Post tib 25U ?All injections were done after obtaining appropriate EMG activity and after negative drawback for blood. The patient tolerated the procedure well. Post procedure instructions were given. A followup appointment was made.  ? ?

## 2021-10-30 ENCOUNTER — Other Ambulatory Visit: Payer: Self-pay | Admitting: Internal Medicine

## 2021-10-30 DIAGNOSIS — K219 Gastro-esophageal reflux disease without esophagitis: Secondary | ICD-10-CM

## 2021-11-07 ENCOUNTER — Other Ambulatory Visit: Payer: Self-pay | Admitting: Internal Medicine

## 2021-11-07 DIAGNOSIS — I10 Essential (primary) hypertension: Secondary | ICD-10-CM

## 2021-11-13 ENCOUNTER — Encounter: Payer: Self-pay | Admitting: Internal Medicine

## 2021-11-13 ENCOUNTER — Ambulatory Visit (INDEPENDENT_AMBULATORY_CARE_PROVIDER_SITE_OTHER): Payer: Medicare HMO | Admitting: Internal Medicine

## 2021-11-13 VITALS — BP 134/82 | HR 88 | Temp 97.8°F | Ht 66.0 in | Wt 196.8 lb

## 2021-11-13 DIAGNOSIS — E039 Hypothyroidism, unspecified: Secondary | ICD-10-CM

## 2021-11-13 DIAGNOSIS — G8110 Spastic hemiplegia affecting unspecified side: Secondary | ICD-10-CM | POA: Diagnosis not present

## 2021-11-13 DIAGNOSIS — E1149 Type 2 diabetes mellitus with other diabetic neurological complication: Secondary | ICD-10-CM | POA: Diagnosis not present

## 2021-11-13 DIAGNOSIS — R6889 Other general symptoms and signs: Secondary | ICD-10-CM | POA: Diagnosis not present

## 2021-11-13 DIAGNOSIS — E7800 Pure hypercholesterolemia, unspecified: Secondary | ICD-10-CM

## 2021-11-13 DIAGNOSIS — I10 Essential (primary) hypertension: Secondary | ICD-10-CM

## 2021-11-13 NOTE — Progress Notes (Signed)
? ? ? ?Established Patient Office Visit ? ? ? ? ?This visit occurred during the SARS-CoV-2 public health emergency.  Safety protocols were in place, including screening questions prior to the visit, additional usage of staff PPE, and extensive cleaning of exam room while observing appropriate contact time as indicated for disinfecting solutions.  ? ? ?CC/Reason for Visit: Follow-up chronic medical conditions ? ?HPI: [name] is a 56 y.o. female who is coming in today for the above mentioned reasons. Past Medical History is significant for: Hypertension, hyperlipidemia, type 2 diabetes, GERD, mild persistent asthma, she has a history of spastic hemiplegia following a right MCA CVA.  She was most recently diagnosed with hypothyroidism and started on levothyroxine 25 mcg daily.  She is feeling well today and has no acute concerns or complaints today.  She is taking all medications as prescribed. ? ? ?Past Medical/Surgical History: ?Past Medical History:  ?Diagnosis Date  ? Anemia   ? ASTHMA 12/10/2009  ? Asthma   ? Cataract   ? CEREBROVASCULAR ACCIDENT, HX OF 12/10/2009  ? Complication of anesthesia   ? "just can't get me up" (06/09/2013)  ? Controlled type 2 diabetes mellitus without complication, without long-term current use of insulin ([place])   ? no meds  ? Embolic stroke of right basal ganglia ([place]) 06/11/2015  ? with L residual weakness/hemiparesis   ? GERD (gastroesophageal reflux disease)   ? HYPERLIPIDEMIA 12/10/2009  ? HYPERTENSION 12/10/2009  ? PFO (patent foramen ovale)   ? Stroke [facility]) 2008  ? denies residual on 06/09/2013  ? Tendonitis of wrist, right   ? ? ?Past Surgical History:  ?Procedure Laterality Date  ? COLONOSCOPY    ? COSMETIC SURGERY    ? OVARIAN CYST REMOVAL  1990's  ? UPPER GASTROINTESTINAL ENDOSCOPY    ? ? ?Social History: ? reports that she has never smoked. She has never used smokeless tobacco. She reports that she does not drink alcohol and does not use drugs. ? ?Allergies: ?Allergies   ?Allergen Reactions  ? Bactrim [Sulfamethoxazole-Trimethoprim] Nausea Only  ?  Nausea   ? Penicillins Rash  ? ? ?Family History:  ?Family History  ?Problem Relation Age of Onset  ? Multiple myeloma Mother   ? Breast cancer Mother   ? Diabetes Mother   ? Diabetes Father   ? Breast cancer Maternal Aunt   ? Stomach cancer Maternal Aunt   ? Liver cancer Maternal Aunt   ? Prostate cancer Maternal Uncle   ? Colon cancer Neg Hx   ? Esophageal cancer Neg Hx   ? Rectal cancer Neg Hx   ? ? ? ?Current Outpatient Medications:  ?  acetaminophen (TYLENOL) 325 MG tablet, Take 2 tablets (650 mg total) by mouth every 6 (six) hours as needed., Disp: 20 tablet, Rfl: 0 ?  albuterol (VENTOLIN HFA) 108 (90 Base) MCG/ACT inhaler, INHALE 2 PUFFS BY MOUTH EVERY 6 HOURS AS NEEDED, Disp: 9 g, Rfl: 1 ?  amLODipine (NORVASC) 10 MG tablet, Take 1 tablet by mouth once daily, Disp: 90 tablet, Rfl: 0 ?  atorvastatin (LIPITOR) 40 MG tablet, Take 1 tablet (40 mg total) by mouth daily., Disp: 90 tablet, Rfl: 1 ?  baclofen (LIORESAL) 10 MG tablet, TAKE 1 TABLET BY MOUTH TWICE DAILY AS NEEDED FOR MUSCLE SPASM, Disp: 60 tablet, Rfl: 0 ?  carvedilol (COREG) 3.125 MG tablet, TAKE 1 TABLET BY MOUTH TWICE DAILY WITH MEALS, Disp: 180 tablet, Rfl: 1 ?  clopidogrel (PLAVIX) 75 MG tablet, Take 1  tablet by mouth once daily, Disp: 90 tablet, Rfl: 1 ?  diphenhydrAMINE (BENADRYL) 25 MG tablet, Take 50 mg by mouth at bedtime as needed. , Disp: , Rfl:  ?  fluticasone (FLOVENT HFA) 110 MCG/ACT inhaler, Inhale 2 puffs into the lungs in the morning and at bedtime., Disp: 1 each, Rfl: 12 ?  levothyroxine (SYNTHROID) 25 MCG tablet, Take 1 tablet (25 mcg total) by mouth daily., Disp: 90 tablet, Rfl: 1 ?  losartan (COZAAR) 50 MG tablet, Take 1 tablet by mouth twice daily, Disp: 180 tablet, Rfl: 0 ?  Multiple Vitamin (MULTIVITAMIN WITH MINERALS) TABS tablet, Take 1 tablet by mouth daily., Disp: , Rfl:  ?  pantoprazole (PROTONIX) 40 MG tablet, TAKE 1 TABLET BY MOUTH ONCE  DAILY AS NEEDED, Disp: 90 tablet, Rfl: 1 ?  polyethylene glycol (MIRALAX / GLYCOLAX) packet, Take 17 g by mouth daily. (Patient taking differently: Take 17 g by mouth daily as needed.), Disp: 30 each, Rfl: 1 ? ?Review of Systems:  ?Constitutional: Denies fever, chills, diaphoresis, appetite change and fatigue.  ?HEENT: Denies photophobia, eye pain, redness, hearing loss, ear pain, congestion, sore throat, rhinorrhea, sneezing, mouth sores, trouble swallowing, neck pain, neck stiffness and tinnitus.   ?Respiratory: Denies SOB, DOE, cough, chest tightness,  and wheezing.   ?Cardiovascular: Denies chest pain, palpitations and leg swelling.  ?Gastrointestinal: Denies nausea, vomiting, abdominal pain, diarrhea, constipation, blood in stool and abdominal distention.  ?Genitourinary: Denies dysuria, urgency, frequency, hematuria, flank pain and difficulty urinating.  ?Endocrine: Denies: hot or cold intolerance, sweats, changes in hair or nails, polyuria, polydipsia. ?Musculoskeletal: Denies myalgias, back pain, joint swelling, arthralgias and gait problem.  ?Skin: Denies pallor, rash and wound.  ?Neurological: Denies dizziness, seizures, syncope, weakness, light-headedness, numbness and headaches.  ?Hematological: Denies adenopathy. Easy bruising, personal or family bleeding history  ?Psychiatric/Behavioral: Denies suicidal ideation, mood changes, confusion, nervousness, sleep disturbance and agitation ? ? ? ?Physical Exam: ?Vitals:  ? 11/13/21 1121  ?BP: 134/82  ?Pulse: 88  ?Temp: 97.8 ?F (36.6 ?C)  ?TempSrc: Oral  ?SpO2: 99%  ?Weight: 196 lb 12.8 oz (89.3 kg)  ?Height: '5\' 6"'  (1.676 m)  ? ? ?Body mass index is 31.76 kg/m?. ? ? ?Constitutional: NAD, calm, comfortable ?Eyes: PERRL, lids and conjunctivae normal wears corrective lenses ?ENMT: Mucous membranes are moist.  ?Respiratory: clear to auscultation bilaterally, no wheezing, no crackles. Normal respiratory effort. No accessory muscle use.  ?Cardiovascular: Regular rate  and rhythm, no murmurs / rubs / gallops. No extremity edema.  ?Neurologic: Left hemiplegia ?Psychiatric: Normal judgment and insight. Alert and oriented x 3. Normal mood.  ? ? ?Impression and Plan: ? ?Acquired hypothyroidism ?-On levothyroxine 25 mcg daily with a recent TSH that was at goal. ? ?Pure hypercholesterolemia ?-Well-controlled with the most recent LDL of 49 in July 2022 ? ?Essential hypertension ?-Fairly well controlled. ? ?Controlled type 2 diabetes mellitus with other neurologic complication, without long-term current use of insulin ([place]) ?-Well-controlled with an A1c of 6.5 ? ?Spastic hemiplegia affecting nondominant side ([place]) ?-Noted, following right MCA CVA ? ? ? ?Time spent:32 minutes reviewing chart, interviewing and examining patient and formulating plan of care. ? ? ?Patient Instructions  ?-Nice seeing you today!! ? ?-Schedule a follow up in 6 months for your physical. ? ? ? ?Lelon Frohlich, MD ?[facility] at [facility] ? ? ?

## 2021-11-13 NOTE — Patient Instructions (Signed)
-  Nice seeing you today!! ? ?-Schedule a follow up in 6 months for your physical. ?

## 2021-11-30 ENCOUNTER — Other Ambulatory Visit: Payer: Self-pay | Admitting: Internal Medicine

## 2021-12-06 ENCOUNTER — Encounter: Payer: Self-pay | Admitting: Physical Medicine & Rehabilitation

## 2021-12-06 ENCOUNTER — Encounter: Payer: Medicare HMO | Attending: Physical Medicine & Rehabilitation | Admitting: Physical Medicine & Rehabilitation

## 2021-12-06 VITALS — BP 125/75 | HR 91 | Ht 66.0 in | Wt 196.6 lb

## 2021-12-06 DIAGNOSIS — G8110 Spastic hemiplegia affecting unspecified side: Secondary | ICD-10-CM | POA: Insufficient documentation

## 2021-12-06 DIAGNOSIS — R6889 Other general symptoms and signs: Secondary | ICD-10-CM | POA: Diagnosis not present

## 2021-12-06 NOTE — Progress Notes (Signed)
? ?Subjective:  ? ? Patient ID: Rebecca Yoder, female    DOB: [birth year], 56 y.o.   MRN: [id number] ? ?HPI ? ?56 year old right-handed female with a ?history of hypertension, previous right brain CVA 2014, remote right ?frontal insular infarct 2008 with known PFO maintained on aspirin. ?Lives with her sister and mother who has cancer.  Presented October 31, ?2016 with sudden onset of left-sided weakness, slurred speech, right ?gaze preference.  Cranial CT scan negative.  The patient did receive ?tPA.  CT of the head and neck showed occluded M1 segment of the right ?middle cerebral artery that appeared to have progressed since prior MR ?angiogram.  MRI of the brain, June 13, 2015 showed progressive ?nonhemorrhagic infarct involving the right basal ganglia.  Remote ?anterior right MCA territory infarct involving the insular cortex. ?Echocardiogram with ejection fraction of 70%, no wall motion ?abnormalities.  Venous Doppler of the lower extremities negative. ?Neurology consulted, maintained on Plavix therapy for CVA prophylaxis. ?She was on a dysphagia #2 thin liquid diet ? ?Patient returns today for approximately 6 weeks post botulinum toxin injection.  She has a chronic left spastic hemiplegia affecting the lower limb greater than the upper limb. ?In the upper extremities she does have isolated finger movement and antigravity strength as well as only mildly impaired sensation. ?In the left lower extremity she continues to use an AFO for ambulation.  She also uses a cane to ambulate. ?LEFT  ?Med gastroc 25U ?Lateral gastroc 25U ?Soleus 25 U ?FDL 75U ?FHL 25U ?Post tib 25U ?Pain Inventory ?Average Pain 0 ?Pain Right Now 0 ?My pain is sharp ? ?LOCATION OF PAIN  fingers, left leg ? ?BOWEL ?Number of stools per week: 7 ? ? ?BLADDER ?Normal ? ? ? ?Mobility ?use a cane ?how many minutes can you walk? 60 ?ability to climb steps?  no ?do you drive?  no ?Do you have any goals in this area?  yes ? ?Function ?disabled:  date disabled . ?Do you have any goals in this area?  yes ? ?Neuro/Psych ?trouble walking ? ?Prior Studies ?Any changes since last visit?  no ? ?Physicians involved in your care ?Any changes since last visit?  no ? ? ?Family History  ?Problem Relation Age of Onset  ? Multiple myeloma Mother   ? Breast cancer Mother   ? Diabetes Mother   ? Diabetes Father   ? Breast cancer Maternal Aunt   ? Stomach cancer Maternal Aunt   ? Liver cancer Maternal Aunt   ? Prostate cancer Maternal Uncle   ? Colon cancer Neg Hx   ? Esophageal cancer Neg Hx   ? Rectal cancer Neg Hx   ? ?Social History  ? ?Socioeconomic History  ? Marital status: Single  ?  Spouse name: Not on file  ? Number of children: 0  ? Years of education: 12th  ? Highest education level: Not on file  ?Occupational History  ? Occupation: budd  ?  Employer: [employer]  ?Tobacco Use  ? Smoking status: Never  ? Smokeless tobacco: Never  ?Vaping Use  ? Vaping Use: Never used  ?Substance and Sexual Activity  ? Alcohol use: No  ?  Alcohol/week: 0.0 standard drinks  ? Drug use: No  ? Sexual activity: Yes  ?Other Topics Concern  ? Not on file  ?Social History Narrative  ? Not on file  ? ?Social Determinants of Health  ? ?Financial Resource Strain: Not on file  ?Food Insecurity: No Food Insecurity  ?  Worried About Charity fundraiser in the Last Year: Never true  ? Ran Out of Food in the Last Year: Never true  ?Transportation Needs: No Transportation Needs  ? Lack of Transportation (Medical): No  ? Lack of Transportation (Non-Medical): No  ?Physical Activity: Sufficiently Active  ? Days of Exercise per Week: 5 days  ? Minutes of Exercise per Session: 60 min  ?Stress: No Stress Concern Present  ? Feeling of Stress : Not at all  ?Social Connections: Not on file  ? ?Past Surgical History:  ?Procedure Laterality Date  ? COLONOSCOPY    ? COSMETIC SURGERY    ? OVARIAN CYST REMOVAL  1990's  ? UPPER GASTROINTESTINAL ENDOSCOPY    ? ?Past Medical History:  ?Diagnosis Date  ? Anemia    ? ASTHMA 12/10/2009  ? Asthma   ? Cataract   ? CEREBROVASCULAR ACCIDENT, HX OF 12/10/2009  ? Complication of anesthesia   ? "just can't get me up" (06/09/2013)  ? Controlled type 2 diabetes mellitus without complication, without long-term current use of insulin ([place])   ? no meds  ? Embolic stroke of right basal ganglia ([place]) 06/11/2015  ? with L residual weakness/hemiparesis   ? GERD (gastroesophageal reflux disease)   ? HYPERLIPIDEMIA 12/10/2009  ? HYPERTENSION 12/10/2009  ? PFO (patent foramen ovale)   ? Stroke [facility]) 2008  ? denies residual on 06/09/2013  ? Tendonitis of wrist, right   ? ?BP 125/75   Pulse 91   Ht '5\' 6"'  (1.676 m)   Wt 196 lb 9.6 oz (89.2 kg)   SpO2 98%   BMI 31.73 kg/m?  ? ?Opioid Risk Score:   ?Fall Risk Score:  `1 ? ?Depression screen PHQ 2/9 ? ? ?  12/06/2021  ? 10:27 AM 11/13/2021  ? 11:26 AM 10/25/2021  ? 10:15 AM 09/12/2021  ? 12:40 PM 08/15/2021  ? 12:53 PM 07/26/2021  ? 11:08 AM 06/07/2021  ?  2:29 PM  ?Depression screen PHQ 2/9  ?Decreased Interest 0 3 0 0 3 0 0  ?Down, Depressed, Hopeless 0 0 0 0 3 0 0  ?PHQ - 2 Score 0 3 0 0 6 0 0  ?Altered sleeping  1   2    ?Tired, decreased energy  2   0    ?Change in appetite  3   3    ?Feeling bad or failure about yourself   0   0    ?Trouble concentrating  2   3    ?Moving slowly or fidgety/restless  0   0    ?Suicidal thoughts  0   0    ?PHQ-9 Score  11   14    ?Difficult doing work/chores  Not difficult at all   Somewhat difficult    ?  ? ?Review of Systems  ?Constitutional: Negative.   ?HENT: Negative.    ?Eyes: Negative.   ?Respiratory: Negative.    ?Cardiovascular: Negative.   ?Gastrointestinal: Negative.   ?Endocrine: Negative.   ?Genitourinary: Negative.   ?Musculoskeletal:  Positive for gait problem.  ?Skin: Negative.   ?Allergic/Immunologic: Negative.   ?Hematological: Negative.   ?Psychiatric/Behavioral: Negative.    ? ?   ?Objective:  ? Physical Exam ?Vitals and nursing note reviewed.  ?Constitutional:   ?   Appearance: She is obese.  ?HENT:  ?    Head: Normocephalic and atraumatic.  ?Eyes:  ?   Extraocular Movements: Extraocular movements intact.  ?   Conjunctiva/sclera: Conjunctivae normal.  ?  Pupils: Pupils are equal, round, and reactive to light.  ?Neurological:  ?   Mental Status: She is alert and oriented to person, place, and time.  ?   Cranial Nerves: No dysarthria.  ?   Sensory: Sensation is intact.  ?   Motor: Abnormal muscle tone present.  ?   Coordination: Coordination abnormal. Impaired rapid alternating movements.  ?   Gait: Gait abnormal.  ?Psychiatric:     ?   Mood and Affect: Mood normal.     ?   Behavior: Behavior normal.  ?Gait is using an AFO she does have mild hyperextension of the knee ?Tone MAS 2 at the plantar flexors and foot inverters on the left side only. ?MAS 1 in the finger and wrist flexors ?Motor strength is 3 - in the left deltoid bicep tricep grip 4 - at the left hip flexor knee extensor and trace ankle dorsiflexor plantar flexor. ? ? ? ? ?   ?Assessment & Plan:  ? ?#1.  Left spastic paraplegia she does get good relief with the botulinum toxin injection left lower extremity we will continue with current dosing. ?2.  Left upper extremity weakness has relatively intact sensation and antigravity movement she would be a good candidate for vivid stim trial pending OT evaluation.  I have given her some information on this she will discuss this with her sister before proceeding with OT evaluation. ?

## 2021-12-23 ENCOUNTER — Ambulatory Visit (INDEPENDENT_AMBULATORY_CARE_PROVIDER_SITE_OTHER): Payer: Medicare HMO

## 2021-12-23 DIAGNOSIS — J4530 Mild persistent asthma, uncomplicated: Secondary | ICD-10-CM

## 2021-12-23 DIAGNOSIS — E7800 Pure hypercholesterolemia, unspecified: Secondary | ICD-10-CM

## 2021-12-23 DIAGNOSIS — G8110 Spastic hemiplegia affecting unspecified side: Secondary | ICD-10-CM

## 2021-12-23 DIAGNOSIS — E1149 Type 2 diabetes mellitus with other diabetic neurological complication: Secondary | ICD-10-CM

## 2021-12-23 DIAGNOSIS — I10 Essential (primary) hypertension: Secondary | ICD-10-CM

## 2021-12-23 NOTE — Patient Instructions (Addendum)
Visit Information ? ?Thank you for taking time to visit with me today. Please don't hesitate to contact me if I can be of assistance to you before our next scheduled telephone appointment. ? ?Following are the goals we discussed today:  ?Take all medications as prescribed ?Attend all scheduled provider appointments ?Call pharmacy for medication refills 3-7 days in advance of running out of medications ?Call provider office for new concerns or questions  ?keep appointment with eye doctor ?check feet daily for cuts, sores or redness ?drink 6 to 8 glasses of water each day ?fill half of plate with vegetables ?manage portion size ?keep feet up while sitting ?wash and dry feet carefully every day ?check blood pressure weekly ?choose a place to take my blood pressure (home, clinic or office, retail store) ?take blood pressure log to all doctor appointments ?eat more whole grains, fruits and vegetables, lean meats and healthy fats ?limit salt intake to '2300mg'$ /day ?call for medicine refill 2 or 3 days before it runs out ?take all medications exactly as prescribed ?call doctor with any symptoms you believe are related to your medicine ?avoid symptom triggers outdoors ?- develop an asthma action plan ?- eliminate symptom triggers at home ?- keep follow-up appointments ?- keep rescue medicines on hand ?- use an extra pillow to sleep ?Our next appointment is by telephone on 03/24/22 at 10 AM ? ?Please call the care guide team at [phone] if you need to cancel or reschedule your appointment.  ? ?If you are experiencing a Mental Health or [place] or need someone to talk to, please call the Suicide and Crisis Lifeline: 988 ?call the [place] National Suicide Prevention Lifeline: 825 141 0991 or TTY: 727 678 8639 TTY 581-328-7338) to talk to a trained counselor ?call 1-800-273-TALK (toll free, 24 hour hotline) ?go to [facility] [address] [place] [phone]) ?call  911  ? ?The patient verbalized understanding of instructions, educational materials, and care plan provided today and agreed to receive a mailed copy of patient instructions, educational materials, and care plan.  ?Peter Garter RN, BSN,CCM, CDE ?Care Management Coordinator ?[facility] ?[phone]   ?

## 2021-12-23 NOTE — Chronic Care Management (AMB) (Signed)
?Chronic Care Management  ? ?CCM RN Visit Note ? ?12/23/2021 ?Name: Rebecca Yoder MRN: [id number] DOB: Jan 01, 1966 ? ?Subjective: ?Rebecca Yoder is a 56 y.o. year old female who is a primary care patient of Isaac Bliss, Rayford Halsted, MD. The care management team was consulted for assistance with disease management and care coordination needs.   ? ?Engaged with patient by telephone for follow up visit in response to provider referral for case management and/or care coordination services.  ? ?Consent to Services:  ?The patient was given information about Chronic Care Management services, agreed to services, and gave verbal consent prior to initiation of services.  Please see initial visit note for detailed documentation.  ? ?Patient agreed to services and verbal consent obtained.  ? ?Assessment: Review of patient past medical history, allergies, medications, health status, including review of consultants reports, laboratory and other test data, was performed as part of comprehensive evaluation and provision of chronic care management services.  ? ?SDOH (Social Determinants of Health) assessments and interventions performed:   ? ?CCM Care Plan ? ?Allergies  ?Allergen Reactions  ? Bactrim [Sulfamethoxazole-Trimethoprim] Nausea Only  ?  Nausea   ? Penicillins Rash  ? ? ?Outpatient Encounter Medications as of 12/23/2021  ?Medication Sig  ? acetaminophen (TYLENOL) 325 MG tablet Take 2 tablets (650 mg total) by mouth every 6 (six) hours as needed.  ? albuterol (VENTOLIN HFA) 108 (90 Base) MCG/ACT inhaler INHALE 2 PUFFS BY MOUTH EVERY 6 HOURS AS NEEDED  ? amLODipine (NORVASC) 10 MG tablet Take 1 tablet by mouth once daily  ? atorvastatin (LIPITOR) 40 MG tablet Take 1 tablet (40 mg total) by mouth daily.  ? baclofen (LIORESAL) 10 MG tablet TAKE 1 TABLET BY MOUTH TWICE DAILY AS NEEDED FOR MUSCLE SPASM  ? carvedilol (COREG) 3.125 MG tablet TAKE 1 TABLET BY MOUTH TWICE DAILY WITH MEALS  ? clopidogrel (PLAVIX) 75 MG  tablet Take 1 tablet by mouth once daily  ? diphenhydrAMINE (BENADRYL) 25 MG tablet Take 50 mg by mouth at bedtime as needed.   ? fluticasone (FLOVENT HFA) 110 MCG/ACT inhaler Inhale 2 puffs into the lungs in the morning and at bedtime.  ? levothyroxine (SYNTHROID) 25 MCG tablet Take 1 tablet (25 mcg total) by mouth daily.  ? losartan (COZAAR) 50 MG tablet Take 1 tablet by mouth twice daily  ? Multiple Vitamin (MULTIVITAMIN WITH MINERALS) TABS tablet Take 1 tablet by mouth daily.  ? pantoprazole (PROTONIX) 40 MG tablet TAKE 1 TABLET BY MOUTH ONCE DAILY AS NEEDED  ? polyethylene glycol (MIRALAX / GLYCOLAX) packet Take 17 g by mouth daily. (Patient taking differently: Take 17 g by mouth daily as needed.)  ? ?No facility-administered encounter medications on file as of 12/23/2021.  ? ? ?Patient Active Problem List  ? Diagnosis Date Noted  ? Hypothyroidism 02/20/2021  ? GERD (gastroesophageal reflux disease) 03/18/2016  ? Spastic hemiplegia affecting nondominant side ([place]) 11/02/2015  ? Gait disturbance, post-stroke 08/14/2015  ? Frozen shoulder syndrome 08/14/2015  ? Subacromial impingement of left shoulder 08/14/2015  ? Left shoulder pain 08/10/2015  ? Diabetes type 2, controlled ([place]) 07/31/2015  ? Eczema 07/31/2015  ? Right middle cerebral artery stroke ([place]) 06/14/2015  ? Left hemiparesis ([place])   ? Left-sided neglect   ? Embolic stroke of right basal ganglia ([place]) 06/11/2015  ? TIA (transient ischemic attack) 06/09/2013  ? PFO (patent foramen ovale) 06/09/2013  ? Hyperlipidemia 12/10/2009  ? Essential hypertension 12/10/2009  ? Mild persistent asthma 12/10/2009  ?  History of cardiovascular disorder 12/10/2009  ? ? ?Conditions to be addressed/monitored:HTN, HLD, DMII, Asthma, and hx stroke ? ?Care Plan : RN Care Manager Plan of Care  ?Updates made by Dimitri Ped, RN since 12/23/2021 12:00 AM  ?  ? ?Problem: Chronic Disease Management and Care Coordination Needs (HTN, stroke, asthma,DM2 and HLD)   ?Priority:  High  ?  ? ?Long-Range Goal: Establish Plan of Care for Chronic Disease Management Needs (HTN, stroke, asthma, DM2 and HLD)   ?Start Date: 06/18/2021  ?Expected End Date: 12/23/2022  ?Recent Progress: On track  ?Priority: High  ?Note:   ?Current Barriers:  ?Chronic Disease Management support and education needs related to HTN, HLD, DMII, Asthma, and hx stroke ?States she has been doing good.  States she has not had any falls. States that the Botox injections help. States she tries to eat healthy States she breathing has been good and she uses her inhalers as directed.  States her B/P is good when it is checked at appointments. States she rides her exercise bike 3-4 times a week and she does strength exercises with her nephew several times a week.   ? ?RNCM Clinical Goal(s):  ?Patient will verbalize understanding of plan for management of HTN, HLD, DMII, Asthma, and hx stroke as evidenced by voiced adherence to plan of care ?verbalize basic understanding of  HTN, HLD, DMII, Asthma, and hx stroke disease process and self health management plan as evidenced by readings within limits, voiced adherence to plan of care ?take all medications exactly as prescribed and will call provider for medication related questions as evidenced by dispense report and pt verbalization ?attend all scheduled medical appointments: Pain/rehab/Botox 01/21/22,Dr. Jerilee Hoh 02/26/22 as evidenced by medical records ?demonstrate Ongoing adherence to prescribed treatment plan for HTN, HLD, DMII, Asthma, and hx stroke as evidenced by readings within limits and adherence to plan of care ?continue to work with RN Care Manager to address care management and care coordination needs related to  HTN, HLD, DMII, Asthma, and hx stroke as evidenced by adherence to CM Team Scheduled appointments through collaboration with RN Care manager, provider, and care team.  ? ?Interventions: ?1:1 collaboration with primary care provider regarding development and update of  comprehensive plan of care as evidenced by provider attestation and co-signature ?Inter-disciplinary care team collaboration (see longitudinal plan of care) ?Evaluation of current treatment plan related to  self management and patient's adherence to plan as established by provider ? ? ?Asthma: (Status:Goal on track:  Yes.) Long Term Goal ?Advised patient to track and manage Asthma triggers ?Provided instruction about proper use of medications used for management of Asthma including inhalers ?Advised patient to self assesses Asthma action plan zone and make appointment with provider if in the yellow zone for 48 hours without improvement ?Advised patient to engage in light exercise as tolerated 3-5 days a week to aid in the the management of Asthma ? ? ?Diabetes Interventions:  (Status:  Goal on track:  Yes.) Long Term Goal ?Assessed patient's understanding of A1c goal: <7% ?Provided education to patient about basic DM disease process ?Reviewed medications with patient and discussed importance of medication adherence ?Counseled on importance of regular laboratory monitoring as prescribed ?Reinforced importance of regular exercise to help keep blood sugar lower ?Lab Results  ?Component Value Date  ? HGBA1C 6.4 08/15/2021  ? ?Falls Interventions:  (Status:  Goal on track:  Yes.) Long Term Goal ?Advised patient of importance of notifying provider of falls ?Assessed for falls since last  encounter ?Assessed patients knowledge of fall risk prevention secondary to previously provided education ? ? ?Hyperlipidemia Interventions:  (Status:  Goal on track:  Yes.) Long Term Goal ?Medication review performed; medication list updated in electronic medical record.  ?Provider established cholesterol goals reviewed ?Counseled on importance of regular laboratory monitoring as prescribed ?Reviewed role and benefits of statin for ASCVD risk reduction ?Reviewed importance of limiting foods high in cholesterol ?Reviewed exercise goals and  target of 150 minutes per week ? ?Hypertension Interventions:  (Status:  Goal on track:  Yes.) Long Term Goal ?Last practice recorded BP readings:  ?BP Readings from Last 3 Encounters:  ?12/06/21 125/75  ?

## 2022-01-08 DIAGNOSIS — E1159 Type 2 diabetes mellitus with other circulatory complications: Secondary | ICD-10-CM | POA: Diagnosis not present

## 2022-01-08 DIAGNOSIS — E785 Hyperlipidemia, unspecified: Secondary | ICD-10-CM | POA: Diagnosis not present

## 2022-01-08 DIAGNOSIS — I10 Essential (primary) hypertension: Secondary | ICD-10-CM

## 2022-01-08 DIAGNOSIS — J45909 Unspecified asthma, uncomplicated: Secondary | ICD-10-CM | POA: Diagnosis not present

## 2022-01-21 ENCOUNTER — Encounter: Payer: Self-pay | Admitting: Physical Medicine & Rehabilitation

## 2022-01-21 ENCOUNTER — Encounter: Payer: Medicare HMO | Attending: Physical Medicine & Rehabilitation | Admitting: Physical Medicine & Rehabilitation

## 2022-01-21 VITALS — BP 134/81 | HR 97 | Temp 98.0°F | Ht 66.0 in | Wt 196.0 lb

## 2022-01-21 DIAGNOSIS — G8114 Spastic hemiplegia affecting left nondominant side: Secondary | ICD-10-CM | POA: Diagnosis not present

## 2022-01-21 DIAGNOSIS — R6889 Other general symptoms and signs: Secondary | ICD-10-CM | POA: Diagnosis not present

## 2022-01-21 DIAGNOSIS — G8110 Spastic hemiplegia affecting unspecified side: Secondary | ICD-10-CM | POA: Diagnosis not present

## 2022-01-21 NOTE — Progress Notes (Signed)
Botox Injection for spasticity using needle EMG guidance  Dilution: 50 Units/ml Indication: Severe spasticity which interferes with ADL,mobility and/or  hygiene and is unresponsive to medication management and other conservative care Informed consent was obtained after describing risks and benefits of the procedure with the patient. This includes bleeding, bruising, infection, excessive weakness, or medication side effects. A REMS form is on file and signed. Needle: 25g 2" needle electrode Number of units per muscle LEFT  Med gastroc 25U Lateral gastroc 25U Soleus 25 U FDL 75U FHL 25U Post tib 25U All injections were done after obtaining appropriate EMG activity and after negative drawback for blood. The patient tolerated the procedure well. Post procedure instructions were given. A followup appointment was made.

## 2022-01-21 NOTE — Patient Instructions (Signed)

## 2022-01-28 ENCOUNTER — Telehealth: Payer: Self-pay

## 2022-01-28 NOTE — Telephone Encounter (Signed)
Patient called and stated she got called for jury duty, but cannot sit for too long. She wants to know if a letter can be written to excuse her from jury duty. Left voicemail to inform us of her jury duty date

## 2022-01-29 ENCOUNTER — Telehealth: Payer: Self-pay

## 2022-01-29 NOTE — Telephone Encounter (Signed)
Rebecca Yoder is scheduled for Jury duty on 03/04/2022. She is requesting a letter to be released from duty. Proof of the [name] Summons is in your box. Please advise. Thank you.

## 2022-01-29 NOTE — Telephone Encounter (Addendum)
She will need to provide [place] with her [place] and Dr Letta Pate will determine if qualifies for dismissal. I have let her know and she will bring the summons to the office.

## 2022-01-30 NOTE — Telephone Encounter (Signed)
We will mail to patient.

## 2022-01-30 NOTE — Telephone Encounter (Signed)
See previous encounter

## 2022-02-02 ENCOUNTER — Other Ambulatory Visit: Payer: Self-pay | Admitting: Internal Medicine

## 2022-02-02 DIAGNOSIS — I10 Essential (primary) hypertension: Secondary | ICD-10-CM

## 2022-02-04 ENCOUNTER — Other Ambulatory Visit: Payer: Self-pay | Admitting: Internal Medicine

## 2022-02-04 DIAGNOSIS — J4540 Moderate persistent asthma, uncomplicated: Secondary | ICD-10-CM

## 2022-02-07 ENCOUNTER — Ambulatory Visit: Payer: Self-pay

## 2022-02-07 DIAGNOSIS — E1149 Type 2 diabetes mellitus with other diabetic neurological complication: Secondary | ICD-10-CM

## 2022-02-07 DIAGNOSIS — I10 Essential (primary) hypertension: Secondary | ICD-10-CM

## 2022-02-07 NOTE — Patient Instructions (Signed)
Visit Information Case closed goals met Thank you for allowing me to share the care management and care coordination services that are available to you as part of your health plan and services through your primary care provider and medical home. Please reach out to me at [phone] if the care management/care coordination team may be of assistance to you in the future.   Dawanna Grauberger RN, BSN,CCM, CDE Care Management Coordinator [facility] [phone]   

## 2022-02-07 NOTE — Chronic Care Management (AMB) (Signed)
Chronic Care Management   CCM RN Visit Note  02/07/2022 Name: Rebecca Yoder MRN: [id number] DOB: [birth year]  Subjective: Rebecca Yoder is a 56 y.o. year old female who is a primary care patient of Isaac Bliss, Rayford Halsted, MD. The care management team was consulted for assistance with disease management and care coordination needs.    Engaged with patient by telephone for follow up visit in response to provider referral for case management and/or care coordination services.   Consent to Services:  The patient was given information about Chronic Care Management services, agreed to services, and gave verbal consent prior to initiation of services.  Please see initial visit note for detailed documentation.   Patient agreed to services and verbal consent obtained.   Assessment: Review of patient past medical history, allergies, medications, health status, including review of consultants reports, laboratory and other test data, was performed as part of comprehensive evaluation and provision of chronic care management services.   SDOH (Social Determinants of Health) assessments and interventions performed:    CCM Care Plan  Allergies  Allergen Reactions   Bactrim [Sulfamethoxazole-Trimethoprim] Nausea Only    Nausea    Penicillins Rash    Outpatient Encounter Medications as of 02/07/2022  Medication Sig   acetaminophen (TYLENOL) 325 MG tablet Take 2 tablets (650 mg total) by mouth every 6 (six) hours as needed.   albuterol (VENTOLIN HFA) 108 (90 Base) MCG/ACT inhaler INHALE 2 PUFFS BY MOUTH EVERY 6 HOURS AS NEEDED   amLODipine (NORVASC) 10 MG tablet Take 1 tablet by mouth once daily   atorvastatin (LIPITOR) 40 MG tablet Take 1 tablet (40 mg total) by mouth daily.   baclofen (LIORESAL) 10 MG tablet TAKE 1 TABLET BY MOUTH TWICE DAILY AS NEEDED FOR MUSCLE SPASM   carvedilol (COREG) 3.125 MG tablet TAKE 1 TABLET BY MOUTH TWICE DAILY WITH MEALS   clopidogrel (PLAVIX) 75 MG  tablet Take 1 tablet by mouth once daily   diphenhydrAMINE (BENADRYL) 25 MG tablet Take 50 mg by mouth at bedtime as needed.    fluticasone (FLOVENT HFA) 110 MCG/ACT inhaler Inhale 2 puffs into the lungs in the morning and at bedtime.   levothyroxine (SYNTHROID) 25 MCG tablet Take 1 tablet (25 mcg total) by mouth daily.   losartan (COZAAR) 50 MG tablet Take 1 tablet by mouth twice daily   Multiple Vitamin (MULTIVITAMIN WITH MINERALS) TABS tablet Take 1 tablet by mouth daily.   pantoprazole (PROTONIX) 40 MG tablet TAKE 1 TABLET BY MOUTH ONCE DAILY AS NEEDED   PNEUMOVAX 23 25 MCG/0.5ML injection    polyethylene glycol (MIRALAX / GLYCOLAX) packet Take 17 g by mouth daily. (Patient taking differently: Take 17 g by mouth daily as needed.)   SHINGRIX injection    No facility-administered encounter medications on file as of 02/07/2022.    Patient Active Problem List   Diagnosis Date Noted   Hypothyroidism 02/20/2021   GERD (gastroesophageal reflux disease) 03/18/2016   Spastic hemiplegia affecting nondominant side ([place]) 11/02/2015   Gait disturbance, post-stroke 08/14/2015   Frozen shoulder syndrome 08/14/2015   Subacromial impingement of left shoulder 08/14/2015   Left shoulder pain 08/10/2015   Diabetes type 2, controlled ([place]) 07/31/2015   Eczema 07/31/2015   Right middle cerebral artery stroke ([place]) 06/14/2015   Left hemiparesis ([place])    Left-sided neglect    Embolic stroke of right basal ganglia ([place]) 06/11/2015   TIA (transient ischemic attack) 06/09/2013   PFO (patent foramen ovale) 06/09/2013   Hyperlipidemia  12/10/2009   Essential hypertension 12/10/2009   Mild persistent asthma 12/10/2009   History of cardiovascular disorder 12/10/2009    Conditions to be addressed/monitored:HTN and DMII  Care Plan : RN Care Manager Plan of Care  Updates made by Dimitri Ped, RN since 02/07/2022 12:00 AM  Completed 02/07/2022   Problem: Chronic Disease Management and Care Coordination  Needs (HTN, stroke, asthma,DM2 and HLD) Resolved 02/07/2022  Priority: High     Long-Range Goal: Establish Plan of Care for Chronic Disease Management Needs (HTN, stroke, asthma, DM2 and HLD) Completed 02/07/2022  Start Date: 06/18/2021  Expected End Date: 12/23/2022  Recent Progress: On track  Priority: High  Note:   Case closed goals met Current Barriers:  Chronic Disease Management support and education needs related to HTN, HLD, DMII, Asthma, and hx stroke States she has been doing good.  States she has not had any falls. States that the Botox injections help. States she tries to eat healthy States she breathing has been good and she uses her inhalers as directed.  States her B/P is good when it is checked at appointments. States she rides her exercise bike 3-4 times a week and she does strength exercises with her nephew several times a week.    RNCM Clinical Goal(s):  Patient will verbalize understanding of plan for management of HTN, HLD, DMII, Asthma, and hx stroke as evidenced by voiced adherence to plan of care verbalize basic understanding of  HTN, HLD, DMII, Asthma, and hx stroke disease process and self health management plan as evidenced by readings within limits, voiced adherence to plan of care take all medications exactly as prescribed and will call provider for medication related questions as evidenced by dispense report and pt verbalization attend all scheduled medical appointments: Pain/rehab/Botox 01/21/22,Dr. Jerilee Hoh 02/26/22 as evidenced by medical records demonstrate Ongoing adherence to prescribed treatment plan for HTN, HLD, DMII, Asthma, and hx stroke as evidenced by readings within limits and adherence to plan of care continue to work with RN Care Manager to address care management and care coordination needs related to  HTN, HLD, DMII, Asthma, and hx stroke as evidenced by adherence to CM Team Scheduled appointments through collaboration with RN Care manager, provider,  and care team.   Interventions: 1:1 collaboration with primary care provider regarding development and update of comprehensive plan of care as evidenced by provider attestation and co-signature Inter-disciplinary care team collaboration (see longitudinal plan of care) Evaluation of current treatment plan related to  self management and patient's adherence to plan as established by provider   Asthma: (Status:Goal Met.) Long Term Goal Advised patient to track and manage Asthma triggers Provided instruction about proper use of medications used for management of Asthma including inhalers Advised patient to self assesses Asthma action plan zone and make appointment with provider if in the yellow zone for 48 hours without improvement Advised patient to engage in light exercise as tolerated 3-5 days a week to aid in the the management of Asthma   Diabetes Interventions:  (Status:  Goal Met.) Long Term Goal Assessed patient's understanding of A1c goal: <7% Provided education to patient about basic DM disease process Reviewed medications with patient and discussed importance of medication adherence Counseled on importance of regular laboratory monitoring as prescribed Reinforced importance of regular exercise to help keep blood sugar lower Lab Results  Component Value Date   HGBA1C 6.4 08/15/2021   Falls Interventions:  (Status:  Goal Met.) Long Term Goal Advised patient of importance of notifying  provider of falls Assessed for falls since last encounter Assessed patients knowledge of fall risk prevention secondary to previously provided education   Hyperlipidemia Interventions:  (Status:  Goal Met.) Long Term Goal Medication review performed; medication list updated in electronic medical record.  Provider established cholesterol goals reviewed Counseled on importance of regular laboratory monitoring as prescribed Reviewed role and benefits of statin for ASCVD risk reduction Reviewed  importance of limiting foods high in cholesterol Reviewed exercise goals and target of 150 minutes per week  Hypertension Interventions:  (Status:  Goal Met.) Long Term Goal Last practice recorded BP readings:  BP Readings from Last 3 Encounters:  12/06/21 125/75  11/13/21 134/82  10/25/21 134/82  Most recent eGFR/CrCl: No results found for: EGFR  No components found for: CRCL  Evaluation of current treatment plan related to hypertension self management and patient's adherence to plan as established by provider Provided education to patient re: stroke prevention, s/s of heart attack and stroke Reviewed medications with patient and discussed importance of compliance Counseled on the importance of exercise goals with target of 150 minutes per week Advised patient, providing education and rationale, to monitor blood pressure daily and record, calling PCP for findings outside established parameters Provided education on prescribed diet low sodium low CHO  Discussed complications of poorly controlled blood pressure such as heart disease, stroke, circulatory complications, vision complications, kidney impairment, sexual dysfunction  Stroke:  (Status:Goal Met.) Long Term Goal Reviewed Importance of taking all medications as prescribed Advised to report any changes in symptoms or exercise tolerance Assessed for signs and symptoms of stroke Reviewed the importance of exercise Reinforced to keep appointments for Botox injections     Patient Goals/Self-Care Activities: Take all medications as prescribed Attend all scheduled provider appointments Call pharmacy for medication refills 3-7 days in advance of running out of medications Call provider office for new concerns or questions  keep appointment with eye doctor check feet daily for cuts, sores or redness drink 6 to 8 glasses of water each day fill half of plate with vegetables manage portion size keep feet up while sitting wash and dry  feet carefully every day check blood pressure weekly choose a place to take my blood pressure (home, clinic or office, retail store) take blood pressure log to all doctor appointments eat more whole grains, fruits and vegetables, lean meats and healthy fats limit salt intake to 2311m/day call for medicine refill 2 or 3 days before it runs out take all medications exactly as prescribed call doctor with any symptoms you believe are related to your medicine -avoid symptom triggers outdoors - develop an asthma action plan - eliminate symptom triggers at home - keep follow-up appointments - keep rescue medicines on hand - use an extra pillow to sleep Follow Up Plan:  The patient has been provided with contact information for the care management team and has been advised to call with any health related questions or concerns.  No further follow up required: Case closed goals met       Plan:The patient has been provided with contact information for the care management team and has been advised to call with any health related questions or concerns.  No further follow up required: Case closed goals met MPeter GarterRN, BPiedmont Medical Center CDE Care Management Coordinator [facility] ([phone]

## 2022-02-08 ENCOUNTER — Other Ambulatory Visit: Payer: Self-pay | Admitting: Internal Medicine

## 2022-02-08 DIAGNOSIS — E039 Hypothyroidism, unspecified: Secondary | ICD-10-CM

## 2022-02-15 ENCOUNTER — Other Ambulatory Visit: Payer: Self-pay | Admitting: Internal Medicine

## 2022-02-15 DIAGNOSIS — E119 Type 2 diabetes mellitus without complications: Secondary | ICD-10-CM

## 2022-02-26 ENCOUNTER — Encounter: Payer: Self-pay | Admitting: Internal Medicine

## 2022-02-26 ENCOUNTER — Ambulatory Visit (INDEPENDENT_AMBULATORY_CARE_PROVIDER_SITE_OTHER): Payer: Medicare HMO | Admitting: Internal Medicine

## 2022-02-26 VITALS — BP 120/74 | HR 94 | Temp 98.1°F | Ht 65.0 in | Wt 195.0 lb

## 2022-02-26 DIAGNOSIS — Z0000 Encounter for general adult medical examination without abnormal findings: Secondary | ICD-10-CM

## 2022-02-26 DIAGNOSIS — E7800 Pure hypercholesterolemia, unspecified: Secondary | ICD-10-CM | POA: Diagnosis not present

## 2022-02-26 DIAGNOSIS — K219 Gastro-esophageal reflux disease without esophagitis: Secondary | ICD-10-CM | POA: Diagnosis not present

## 2022-02-26 DIAGNOSIS — E039 Hypothyroidism, unspecified: Secondary | ICD-10-CM

## 2022-02-26 DIAGNOSIS — E1149 Type 2 diabetes mellitus with other diabetic neurological complication: Secondary | ICD-10-CM | POA: Diagnosis not present

## 2022-02-26 DIAGNOSIS — I10 Essential (primary) hypertension: Secondary | ICD-10-CM

## 2022-02-26 DIAGNOSIS — G8110 Spastic hemiplegia affecting unspecified side: Secondary | ICD-10-CM

## 2022-02-26 DIAGNOSIS — R6889 Other general symptoms and signs: Secondary | ICD-10-CM | POA: Diagnosis not present

## 2022-02-26 LAB — CBC WITH DIFFERENTIAL/PLATELET
Basophils Absolute: 0 10*3/uL (ref 0.0–0.1)
Basophils Relative: 0.3 % (ref 0.0–3.0)
Eosinophils Absolute: 0.2 10*3/uL (ref 0.0–0.7)
Eosinophils Relative: 3 % (ref 0.0–5.0)
HCT: 35.1 % — ABNORMAL LOW (ref 36.0–46.0)
Hemoglobin: 11.6 g/dL — ABNORMAL LOW (ref 12.0–15.0)
Lymphocytes Relative: 36.6 % (ref 12.0–46.0)
Lymphs Abs: 2.3 10*3/uL (ref 0.7–4.0)
MCHC: 32.9 g/dL (ref 30.0–36.0)
MCV: 85.8 fl (ref 78.0–100.0)
Monocytes Absolute: 0.9 10*3/uL (ref 0.1–1.0)
Monocytes Relative: 13.5 % — ABNORMAL HIGH (ref 3.0–12.0)
Neutro Abs: 3 10*3/uL (ref 1.4–7.7)
Neutrophils Relative %: 46.6 % (ref 43.0–77.0)
Platelets: 471 10*3/uL — ABNORMAL HIGH (ref 150.0–400.0)
RBC: 4.09 Mil/uL (ref 3.87–5.11)
RDW: 15.4 % (ref 11.5–15.5)
WBC: 6.4 10*3/uL (ref 4.0–10.5)

## 2022-02-26 LAB — LIPID PANEL
Cholesterol: 114 mg/dL (ref 0–200)
HDL: 45.2 mg/dL (ref >39.00)
LDL Cholesterol: 49 mg/dL (ref 0–99)
NonHDL: 68.67
Total CHOL/HDL Ratio: 3
Triglycerides: 98 mg/dL (ref 0.0–149.0)
VLDL: 19.6 mg/dL (ref 0.0–40.0)

## 2022-02-26 LAB — COMPREHENSIVE METABOLIC PANEL
ALT: 28 U/L (ref 0–35)
AST: 20 U/L (ref 0–37)
Albumin: 4.4 g/dL (ref 3.5–5.2)
Alkaline Phosphatase: 115 U/L (ref 39–117)
BUN: 15 mg/dL (ref 6–23)
CO2: 29 mEq/L (ref 19–32)
Calcium: 10.1 mg/dL (ref 8.4–10.5)
Chloride: 99 mEq/L (ref 96–112)
Creatinine, Ser: 0.76 mg/dL (ref 0.40–1.20)
GFR: 89.1 mL/min (ref >60.00)
Glucose, Bld: 96 mg/dL (ref 70–99)
Potassium: 4.2 mEq/L (ref 3.5–5.1)
Sodium: 138 mEq/L (ref 135–145)
Total Bilirubin: 0.3 mg/dL (ref 0.2–1.2)
Total Protein: 8.4 g/dL — ABNORMAL HIGH (ref 6.0–8.3)

## 2022-02-26 LAB — VITAMIN D 25 HYDROXY (VIT D DEFICIENCY, FRACTURES): VITD: 55.63 ng/mL (ref 30.00–100.00)

## 2022-02-26 LAB — MICROALBUMIN / CREATININE URINE RATIO
Creatinine,U: 107.9 mg/dL
Microalb Creat Ratio: 2 mg/g (ref 0.0–30.0)
Microalb, Ur: 2.1 mg/dL — ABNORMAL HIGH (ref 0.0–1.9)

## 2022-02-26 LAB — HEMOGLOBIN A1C: Hgb A1c MFr Bld: 7 % — ABNORMAL HIGH (ref 4.6–6.5)

## 2022-02-26 LAB — VITAMIN B12: Vitamin B-12: >1504 pg/mL — ABNORMAL HIGH (ref 211–911)

## 2022-02-26 LAB — TSH: TSH: 4.01 u[IU]/mL (ref 0.35–5.50)

## 2022-02-26 MED ORDER — PANTOPRAZOLE SODIUM 40 MG PO TBEC: 40.0000 mg | DELAYED_RELEASE_TABLET | Freq: Every day | ORAL | 1 refills | Status: AC | PRN

## 2022-02-26 NOTE — Progress Notes (Signed)
Established Patient Office Visit     CC/Reason for Visit: Annual preventive exam and subsequent Medicare wellness visit  HPI: Rebecca Yoder is a 56 y.o. female who is coming in today for the above mentioned reasons. Past Medical History is significant for: Hypertension, hyperlipidemia, type 2 diabetes, GERD, mild persistent asthma, she has a history of spastic hemiplegia following a right MCA CVA.  She was most recently diagnosed with hypothyroidism and started on levothyroxine 25 mcg daily.  She is feeling well today and has no acute concerns or complaints today.  She is taking all medications as prescribed.   Past Medical/Surgical History: Past Medical History:  Diagnosis Date   Anemia    ASTHMA 12/10/2009   Asthma    Cataract    CEREBROVASCULAR ACCIDENT, HX OF 01/17/6294   Complication of anesthesia    "just can't get me up" (06/09/2013)   Controlled type 2 diabetes mellitus without complication, without long-term current use of insulin (HCC)    no meds   Embolic stroke of right basal ganglia ([place]) 06/11/2015   with L residual weakness/hemiparesis    GERD (gastroesophageal reflux disease)    HYPERLIPIDEMIA 12/10/2009   HYPERTENSION 12/10/2009   PFO (patent foramen ovale)    Stroke ([place]) 2008   denies residual on 06/09/2013   Tendonitis of wrist, right     Past Surgical History:  Procedure Laterality Date   COLONOSCOPY     COSMETIC SURGERY     OVARIAN CYST REMOVAL  1990's   UPPER GASTROINTESTINAL ENDOSCOPY      Social History:  reports that she has never smoked. She has never used smokeless tobacco. She reports that she does not drink alcohol and does not use drugs.  Allergies: Allergies  Allergen Reactions   Bactrim [Sulfamethoxazole-Trimethoprim] Nausea Only    Nausea    Penicillins Rash    Family History:  Family History  Problem Relation Age of Onset   Multiple myeloma Mother    Breast cancer Mother    Diabetes Mother    Diabetes Father    Breast  cancer Maternal Aunt    Stomach cancer Maternal Aunt    Liver cancer Maternal Aunt    Prostate cancer Maternal Uncle    Colon cancer Neg Hx    Esophageal cancer Neg Hx    Rectal cancer Neg Hx      Current Outpatient Medications:    acetaminophen (TYLENOL) 325 MG tablet, Take 2 tablets (650 mg total) by mouth every 6 (six) hours as needed., Disp: 20 tablet, Rfl: 0   albuterol (VENTOLIN HFA) 108 (90 Base) MCG/ACT inhaler, INHALE 2 PUFFS BY MOUTH EVERY 6 HOURS AS NEEDED, Disp: 9 g, Rfl: 0   amLODipine (NORVASC) 10 MG tablet, Take 1 tablet by mouth once daily, Disp: 90 tablet, Rfl: 0   atorvastatin (LIPITOR) 40 MG tablet, Take 1 tablet by mouth once daily, Disp: 30 tablet, Rfl: 0   carvedilol (COREG) 3.125 MG tablet, TAKE 1 TABLET BY MOUTH TWICE DAILY WITH MEALS, Disp: 180 tablet, Rfl: 1   clopidogrel (PLAVIX) 75 MG tablet, Take 1 tablet by mouth once daily, Disp: 90 tablet, Rfl: 1   diphenhydrAMINE (BENADRYL) 25 MG tablet, Take 50 mg by mouth at bedtime as needed. , Disp: , Rfl:    fluticasone (FLOVENT HFA) 110 MCG/ACT inhaler, Inhale 2 puffs into the lungs in the morning and at bedtime., Disp: 1 each, Rfl: 12   levothyroxine (SYNTHROID) 25 MCG tablet, Take 1 tablet by mouth  once daily, Disp: 90 tablet, Rfl: 0   losartan (COZAAR) 50 MG tablet, Take 1 tablet by mouth twice daily, Disp: 180 tablet, Rfl: 1   Multiple Vitamin (MULTIVITAMIN WITH MINERALS) TABS tablet, Take 1 tablet by mouth daily., Disp: , Rfl:    polyethylene glycol (MIRALAX / GLYCOLAX) packet, Take 17 g by mouth daily. (Patient taking differently: Take 17 g by mouth daily as needed.), Disp: 30 each, Rfl: 1   pantoprazole (PROTONIX) 40 MG tablet, Take 1 tablet (40 mg total) by mouth daily as needed., Disp: 90 tablet, Rfl: 1  Review of Systems:  Constitutional: Denies fever, chills, diaphoresis, appetite change and fatigue.  HEENT: Denies photophobia, eye pain, redness, hearing loss, ear pain, congestion, sore throat, rhinorrhea,  sneezing, mouth sores, trouble swallowing, neck pain, neck stiffness and tinnitus.   Respiratory: Denies SOB, DOE, cough, chest tightness,  and wheezing.   Cardiovascular: Denies chest pain, palpitations and leg swelling.  Gastrointestinal: Denies nausea, vomiting, abdominal pain, diarrhea, constipation, blood in stool and abdominal distention.  Genitourinary: Denies dysuria, urgency, frequency, hematuria, flank pain and difficulty urinating.  Endocrine: Denies: hot or cold intolerance, sweats, changes in hair or nails, polyuria, polydipsia. Musculoskeletal: Denies myalgias, back pain, joint swelling, arthralgias and gait problem.  Skin: Denies pallor, rash and wound.  Neurological: Denies dizziness, seizures, syncope,  light-headedness, numbness and headaches.  Hematological: Denies adenopathy. Easy bruising, personal or family bleeding history  Psychiatric/Behavioral: Denies suicidal ideation, mood changes, confusion, nervousness, sleep disturbance and agitation    Physical Exam: Vitals:   02/26/22 0949  BP: 120/74  Pulse: 94  Temp: 98.1 F (36.7 C)  TempSrc: Oral  SpO2: 97%  Weight: 195 lb (88.5 kg)  Height: '5\' 5"'  (1.651 m)    Body mass index is 32.45 kg/m.   Constitutional: NAD, calm, comfortable Eyes: PERRL, lids and conjunctivae normal, wears corrective lenses ENMT: Mucous membranes are moist. Posterior pharynx clear of any exudate or lesions. Normal dentition. Tympanic membrane is pearly white, no erythema or bulging. Neck: normal, supple, no masses, no thyromegaly Respiratory: clear to auscultation bilaterally, no wheezing, no crackles. Normal respiratory effort. No accessory muscle use.  Cardiovascular: Regular rate and rhythm, no murmurs / rubs / gallops. No extremity edema. 2+ pedal pulses. No carotid bruits.  Abdomen: no tenderness, no masses palpated. No hepatosplenomegaly. Bowel sounds positive.  Musculoskeletal: no clubbing / cyanosis. No joint deformity upper and  lower extremities. Good ROM, no contractures. Normal muscle tone.  Skin: no rashes, lesions, ulcers. No induration Neurologic: Left spastic hemiparesis Psychiatric: Normal judgment and insight. Alert and oriented x 3. Normal mood.   Diabetic Foot Exam - Simple   Simple Foot Form Diabetic Foot exam was performed with the following findings: Yes 02/26/2022 10:14 AM  Visual Inspection No deformities, no ulcerations, no other skin breakdown bilaterally: Yes Sensation Testing Intact to touch and monofilament testing bilaterally: Yes Pulse Check Posterior Tibialis and Dorsalis pulse intact bilaterally: Yes Comments Left hemiparesis as a consequence to MCA CVA      Subsequent Medicare wellness visit   1. Risk factors, based on past  M,S,F -cardiovascular disease risk factors include history of hypertension, hyperlipidemia, diabetes and prior CVA   2.  Physical activities: Walks and does stretching exercises at home   3.  Depression/mood: Stable not depressed   4.  Hearing: No perceived issues   5.  ADL's: Independent for most ADLs   6.  Fall risk: High fall risk due to left hemiparesis   7.  Home  safety: No problems identified   8.  Height weight, and visual acuity: height and weight as above, vision: 20/25 with both eyes with correction    9.  Counseling: We have discussed healthy lifestyle changes   10. Lab orders based on risk factors: Laboratory update will be reviewed   11. Referral : None today   12. Care plan: Follow-up with me in 3 months   13. Cognitive assessment: No cognitive impairment   14. Screening: Patient provided with a written and personalized 5-10 year screening schedule in the AVS. yes   15. Provider List Update: PCP, physical medicine and rehab  16. Advance Directives: Full code   17. Opioids: Patient is not on any opioid prescriptions and has no risk factors for a substance use disorder.   [place] Office Visit from 02/26/2022 in [place] at [place]  PHQ-9 Total Score 0          10/25/2021   10:15 AM 11/13/2021   11:25 AM 12/06/2021   10:27 AM 01/21/2022    2:14 PM 02/26/2022   10:17 AM  Fall Risk  Falls in the past year? 1 1 0 0 0  Was there an injury with Fall? 0 0  0 0  Fall Risk Category Calculator 2 2  0 0  Fall Risk Category Moderate Moderate  Low Low  Patient Fall Risk Level Moderate fall risk Low fall risk   Low fall risk  Patient at Risk for Falls Due to History of fall(s) Impaired balance/gait   No Fall Risks  Fall risk Follow up  Falls evaluation completed   Falls evaluation completed     Impression and Plan:  Encounter for preventive health examination  Gastroesophageal reflux disease - Plan: pantoprazole (PROTONIX) 40 MG tablet  Acquired hypothyroidism - Plan: TSH, TSH  Gastroesophageal reflux disease, unspecified whether esophagitis present  Spastic hemiplegia affecting nondominant side ([place]) - Plan: Vitamin B12, VITAMIN D 25 Hydroxy (Vit-D Deficiency, Fractures), VITAMIN D 25 Hydroxy (Vit-D Deficiency, Fractures), Vitamin B12  Essential hypertension  Pure hypercholesterolemia  Controlled type 2 diabetes mellitus with other neurologic complication, without long-term current use of insulin ([place]) - Plan: Urine microalbumin-creatinine with uACR, CBC with Differential/Platelet, Comprehensive metabolic panel, Hemoglobin A1c, Lipid panel, Lipid panel, Hemoglobin A1c, Comprehensive metabolic panel, CBC with Differential/Platelet, Urine microalbumin-creatinine with uACR  -Recommend routine eye and dental care. -Immunizations: All immunizations are up-to-date and age-appropriate -Healthy lifestyle discussed in detail. -Labs to be updated today. -Colon cancer screening: 06/2020 -Breast cancer screening: 04/2021 -Cervical cancer screening: 11/2019 -Lung cancer screening: Not applicable -Prostate cancer screening: Not applicable -DEXA: Not applicable    Patient Instructions  -Nice seeing  you today!!  -Lab work today; will notify you once results are available.  -Schedule follow up in 3 months.      Lelon Frohlich, MD [facility]

## 2022-02-26 NOTE — Patient Instructions (Signed)
-  Nice seeing you today!!  -Lab work today; will notify you once results are available.  -Schedule follow up in 3 months. 

## 2022-03-12 ENCOUNTER — Other Ambulatory Visit: Payer: Self-pay | Admitting: Internal Medicine

## 2022-03-12 DIAGNOSIS — J4540 Moderate persistent asthma, uncomplicated: Secondary | ICD-10-CM

## 2022-03-13 ENCOUNTER — Encounter: Payer: Self-pay | Admitting: Physical Medicine & Rehabilitation

## 2022-03-13 ENCOUNTER — Encounter: Payer: Medicare HMO | Attending: Physical Medicine & Rehabilitation | Admitting: Physical Medicine & Rehabilitation

## 2022-03-13 ENCOUNTER — Telehealth: Payer: Self-pay | Admitting: Internal Medicine

## 2022-03-13 VITALS — BP 151/91 | HR 97 | Ht 65.0 in | Wt 193.4 lb

## 2022-03-13 DIAGNOSIS — G8110 Spastic hemiplegia affecting unspecified side: Secondary | ICD-10-CM | POA: Insufficient documentation

## 2022-03-13 NOTE — Telephone Encounter (Signed)
Pt states fluticasone (FLOVENT HFA) 110 MCG/ACT inhaler is not covered by her insurance and she needs a prescription for one on her formulary

## 2022-03-13 NOTE — Progress Notes (Signed)
Subjective:    Patient ID: Rebecca Yoder, female    DOB: [birth year], 56 y.o.   MRN: [id number]  HPI 56 yo female with Left hemiparesis due to Right CVA, in 2016.   Spasticity primarily affects left lower extremity. We discussed the vivistim procedure last visit she does not wish to be screened for the procedure. The patient has been trying to incorporate the left upper extremity use into her ADLs.  The patient feels the botulinum toxin injection has been helpful with her toe flexion spasticity.  Her big toe still flexes a little bit more than her small toes.  The patient lives with her sister she does not climb steps she does not drive her sister needs to do shopping for her.  She does not smoke cigarettes Pain Inventory Average Pain 0 Pain Right Now 0 My pain is sharp  LOCATION OF PAIN  leg  BOWEL Number of stools per week: everyday   BLADDER Normal    Mobility use a cane ability to climb steps?  no do you drive?  no Do you have any goals in this area?  yes  Function disabled: date disabled 06/11/15 I need assistance with the following:  shopping Do you have any goals in this area?  yes  Neuro/Psych trouble walking  Prior Studies Any changes since last visit?  no  Physicians involved in your care Any changes since last visit?  no   Family History  Problem Relation Age of Onset   Multiple myeloma Mother    Breast cancer Mother    Diabetes Mother    Diabetes Father    Breast cancer Maternal Aunt    Stomach cancer Maternal Aunt    Liver cancer Maternal Aunt    Prostate cancer Maternal Uncle    Colon cancer Neg Hx    Esophageal cancer Neg Hx    Rectal cancer Neg Hx    Social History   Socioeconomic History   Marital status: Single    Spouse name: Not on file   Number of children: 0   Years of education: 12th   Highest education level: Not on file  Occupational History   Occupation: budd    Fish farm manager: [employer]  Tobacco Use   Smoking  status: Never   Smokeless tobacco: Never  Vaping Use   Vaping Use: Never used  Substance and Sexual Activity   Alcohol use: No    Alcohol/week: 0.0 standard drinks of alcohol   Drug use: No   Sexual activity: Yes  Other Topics Concern   Not on file  Social History Narrative   Not on file   Social Determinants of Health   Financial Resource Strain: Not on file  Food Insecurity: No Food Insecurity (05/14/2021)   Hunger Vital Sign    Worried About Running Out of Food in the Last Year: Never true    Ran Out of Food in the Last Year: Never true  Transportation Needs: No Transportation Needs (05/14/2021)   PRAPARE - Hydrologist (Medical): No    Lack of Transportation (Non-Medical): No  Physical Activity: Sufficiently Active (05/14/2021)   Exercise Vital Sign    Days of Exercise per Week: 5 days    Minutes of Exercise per Session: 60 min  Stress: No Stress Concern Present (05/14/2021)   [place]    Feeling of Stress : Not at all  Social Connections: Not on file  Past Surgical History:  Procedure Laterality Date   COLONOSCOPY     COSMETIC SURGERY     OVARIAN CYST REMOVAL  1990's   UPPER GASTROINTESTINAL ENDOSCOPY     Past Medical History:  Diagnosis Date   Anemia    ASTHMA 12/10/2009   Asthma    Cataract    CEREBROVASCULAR ACCIDENT, HX OF 04/15/7095   Complication of anesthesia    "just can't get me up" (06/09/2013)   Controlled type 2 diabetes mellitus without complication, without long-term current use of insulin (HCC)    no meds   Embolic stroke of right basal ganglia ([place]) 06/11/2015   with L residual weakness/hemiparesis    GERD (gastroesophageal reflux disease)    HYPERLIPIDEMIA 12/10/2009   HYPERTENSION 12/10/2009   PFO (patent foramen ovale)    Stroke ([place]) 2008   denies residual on 06/09/2013   Tendonitis of wrist, right    BP (!) 151/91   Pulse 97   Ht _0  (1.651  m)   Wt 193 lb 6.4 oz (87.7 kg)   SpO2 97%   BMI 32.18 kg/m   Opioid Risk Score:   Fall Risk Score:  `1  Depression screen [facility] 2/9     03/13/2022    2:56 PM 02/26/2022   10:17 AM 01/21/2022    2:14 PM 12/06/2021   10:27 AM 11/13/2021   11:26 AM 10/25/2021   10:15 AM 09/12/2021   12:40 PM  Depression screen PHQ 2/9  Decreased Interest 0 0 0 0 3 0 0  Down, Depressed, Hopeless 0 0 0 0 0 0 0  PHQ - 2 Score 0 0 0 0 3 0 0  Altered sleeping  0   1    Tired, decreased energy  0   2    Change in appetite  0   3    Feeling bad or failure about yourself   0   0    Trouble concentrating  0   2    Moving slowly or fidgety/restless  0   0    Suicidal thoughts  0   0    PHQ-9 Score  0   11    Difficult doing work/chores  Not difficult at all   Not difficult at all       Review of Systems  Constitutional: Negative.   HENT: Negative.    Eyes: Negative.   Respiratory: Negative.    Cardiovascular: Negative.   Gastrointestinal: Negative.   Endocrine: Negative.   Genitourinary: Negative.   Musculoskeletal:  Positive for gait problem.  Skin: Negative.   Allergic/Immunologic: Negative.   Hematological: Negative.   Psychiatric/Behavioral: Negative.        Objective:   Physical Exam Vitals and nursing note reviewed.  Constitutional:      Appearance: She is obese.  Eyes:     Extraocular Movements: Extraocular movements intact.     Conjunctiva/sclera: Conjunctivae normal.     Pupils: Pupils are equal, round, and reactive to light.  Skin:    General: Skin is warm and dry.  Neurological:     Mental Status: She is alert and oriented to person, place, and time.     Comments: Motor strength is 4 - in the left deltoid bicep tricep grip The patient has slow but accurate finger to thumb opposition in the left hand.  Positive dysdiadochokinesis with rapid alternating supination pronation left upper extremity. Left lower extremity 4/5 in left hip flexor and knee extensor trace ankle dorsiflexion  2/5 ankle plantarflexion No evidence of clonus at the ankle With standing the patient has no evidence of inversion of the foot there is mild clawing of the large toe more so than the small toes. She ambulates with a cane as well as left AFO.  She has no evidence of toe drag with the AFO.  Psychiatric:        Mood and Affect: Mood normal.        Behavior: Behavior normal.           Assessment & Plan:     Right hemiparesis  due to CVA, will make adjustments to botulinum toxin dose will not inject the medial gastroc but instead add additional 25 units to the left flexor hallucis longus LEFT   Lateral gastroc 25U Soleus 25 U FDL 75U FHL 50U Post tib 25U

## 2022-03-13 NOTE — Patient Instructions (Addendum)
Will increase Botox dose to big toe flexor

## 2022-03-13 NOTE — Telephone Encounter (Signed)
Patient informed to contact insurance to see what rx they are willing to cover

## 2022-03-14 NOTE — Telephone Encounter (Signed)
Pt is calling and flovent diskus and arnuity ellipta is covered under her insurance

## 2022-03-17 ENCOUNTER — Other Ambulatory Visit: Payer: Self-pay | Admitting: Internal Medicine

## 2022-03-17 ENCOUNTER — Other Ambulatory Visit: Payer: Self-pay | Admitting: *Deleted

## 2022-03-17 DIAGNOSIS — E119 Type 2 diabetes mellitus without complications: Secondary | ICD-10-CM

## 2022-03-17 MED ORDER — FLOVENT DISKUS 100 MCG/ACT IN AEPB: 2.0000 | INHALATION_SPRAY | Freq: Every day | RESPIRATORY_TRACT | 0 refills | Status: DC

## 2022-03-18 ENCOUNTER — Ambulatory Visit: Payer: Medicare HMO | Admitting: Internal Medicine

## 2022-03-22 ENCOUNTER — Other Ambulatory Visit: Payer: Self-pay | Admitting: Internal Medicine

## 2022-03-22 DIAGNOSIS — I63511 Cerebral infarction due to unspecified occlusion or stenosis of right middle cerebral artery: Secondary | ICD-10-CM

## 2022-03-24 ENCOUNTER — Telehealth: Payer: Medicare HMO

## 2022-03-24 ENCOUNTER — Other Ambulatory Visit: Payer: Self-pay | Admitting: Internal Medicine

## 2022-03-24 DIAGNOSIS — I10 Essential (primary) hypertension: Secondary | ICD-10-CM

## 2022-03-28 ENCOUNTER — Other Ambulatory Visit: Payer: Self-pay | Admitting: Internal Medicine

## 2022-03-28 DIAGNOSIS — J4540 Moderate persistent asthma, uncomplicated: Secondary | ICD-10-CM

## 2022-04-26 ENCOUNTER — Other Ambulatory Visit: Payer: Self-pay | Admitting: Internal Medicine

## 2022-04-26 DIAGNOSIS — I10 Essential (primary) hypertension: Secondary | ICD-10-CM

## 2022-04-29 ENCOUNTER — Encounter: Payer: Medicare HMO | Attending: Physical Medicine & Rehabilitation | Admitting: Physical Medicine & Rehabilitation

## 2022-04-29 ENCOUNTER — Encounter: Payer: Self-pay | Admitting: Physical Medicine & Rehabilitation

## 2022-04-29 VITALS — BP 134/84 | HR 85 | Ht 65.0 in | Wt 196.8 lb

## 2022-04-29 DIAGNOSIS — G8114 Spastic hemiplegia affecting left nondominant side: Secondary | ICD-10-CM | POA: Diagnosis not present

## 2022-04-29 DIAGNOSIS — R6889 Other general symptoms and signs: Secondary | ICD-10-CM | POA: Diagnosis not present

## 2022-04-29 DIAGNOSIS — G8110 Spastic hemiplegia affecting unspecified side: Secondary | ICD-10-CM | POA: Insufficient documentation

## 2022-04-29 MED ORDER — INCOBOTULINUMTOXINA 200 UNITS IM SOLR: 200.0000 [IU] | Freq: Once | INTRAMUSCULAR | Status: DC

## 2022-04-29 MED ORDER — ONABOTULINUMTOXINA 100 UNITS IJ SOLR
100.0000 [IU] | Freq: Once | INTRAMUSCULAR | Status: AC
  Administered 2022-04-29: 100 [IU] via INTRAMUSCULAR

## 2022-04-29 NOTE — Progress Notes (Signed)
Botox Injection for spasticity using needle EMG guidance  Dilution: 50 Units/ml Indication: Severe spasticity which interferes with ADL,mobility and/or  hygiene and is unresponsive to medication management and other conservative care Informed consent was obtained after describing risks and benefits of the procedure with the patient. This includes bleeding, bruising, infection, excessive weakness, or medication side effects. A REMS form is on file and signed. Needle: 25g 2" needle electrode Number of units per muscle Left lower ext  LEFT   Lateral gastroc 25U Soleus 25 U FDL 75U FHL 50U Post tib 25U All injections were done after obtaining appropriate EMG activity and after negative drawback for blood. The patient tolerated the procedure well. Post procedure instructions were given. A followup appointment was made.

## 2022-04-29 NOTE — Addendum Note (Signed)
Addended by: Jasmine December T on: 04/29/2022 01:52 PM   Modules accepted: Orders

## 2022-04-29 NOTE — Patient Instructions (Signed)
You received a Xeomin injection today. You may experience soreness at the needle injection sites. Please call us if any of the injection sites turns red after a couple days or if there is any drainage. You may experience muscle weakness as a result of Xeomin This would improve with time but can take several weeks to improve. The Xeomin should start working in about one week. The Xeomin usually last 3 months. The injection can be repeated every 3 months as needed.  

## 2022-05-03 ENCOUNTER — Other Ambulatory Visit: Payer: Self-pay | Admitting: Internal Medicine

## 2022-05-03 DIAGNOSIS — I10 Essential (primary) hypertension: Secondary | ICD-10-CM

## 2022-05-12 ENCOUNTER — Other Ambulatory Visit: Payer: Self-pay | Admitting: Internal Medicine

## 2022-05-12 DIAGNOSIS — E039 Hypothyroidism, unspecified: Secondary | ICD-10-CM

## 2022-05-28 ENCOUNTER — Other Ambulatory Visit: Payer: Self-pay | Admitting: Internal Medicine

## 2022-05-29 ENCOUNTER — Other Ambulatory Visit: Payer: Self-pay | Admitting: Internal Medicine

## 2022-05-29 ENCOUNTER — Ambulatory Visit (INDEPENDENT_AMBULATORY_CARE_PROVIDER_SITE_OTHER): Payer: Medicare HMO | Admitting: Internal Medicine

## 2022-05-29 ENCOUNTER — Encounter: Payer: Self-pay | Admitting: Internal Medicine

## 2022-05-29 VITALS — BP 124/79 | HR 88 | Temp 98.2°F | Wt 198.1 lb

## 2022-05-29 DIAGNOSIS — G8110 Spastic hemiplegia affecting unspecified side: Secondary | ICD-10-CM | POA: Diagnosis not present

## 2022-05-29 DIAGNOSIS — I10 Essential (primary) hypertension: Secondary | ICD-10-CM | POA: Diagnosis not present

## 2022-05-29 DIAGNOSIS — E7800 Pure hypercholesterolemia, unspecified: Secondary | ICD-10-CM | POA: Diagnosis not present

## 2022-05-29 DIAGNOSIS — Z23 Encounter for immunization: Secondary | ICD-10-CM | POA: Diagnosis not present

## 2022-05-29 DIAGNOSIS — E1149 Type 2 diabetes mellitus with other diabetic neurological complication: Secondary | ICD-10-CM | POA: Diagnosis not present

## 2022-05-29 DIAGNOSIS — Z1231 Encounter for screening mammogram for malignant neoplasm of breast: Secondary | ICD-10-CM

## 2022-05-29 DIAGNOSIS — R6889 Other general symptoms and signs: Secondary | ICD-10-CM | POA: Diagnosis not present

## 2022-05-29 LAB — POCT GLYCOSYLATED HEMOGLOBIN (HGB A1C): Hemoglobin A1C: 6.4 % — AB (ref 4.0–5.6)

## 2022-05-29 NOTE — Progress Notes (Signed)
Established Patient Office Visit     CC/Reason for Visit: 3 month follow-up chronic medical condition  HPI: Rebecca Yoder is a 56 y.o. female who is coming in today for the above mentioned reasons. Past Medical History is significant for: Hypertension, hyperlipidemia, GERD, type 2 diabetes, mild intermittent asthma, history of CVA with left spastic hemiplegia.  She is doing well and has no acute concerns or complaints.   Past Medical/Surgical History: Past Medical History:  Diagnosis Date   Anemia    ASTHMA 12/10/2009   Asthma    Cataract    CEREBROVASCULAR ACCIDENT, HX OF 03/18/9168   Complication of anesthesia    "just can't get me up" (06/09/2013)   Controlled type 2 diabetes mellitus without complication, without long-term current use of insulin (HCC)    no meds   Embolic stroke of right basal ganglia ([place]) 06/11/2015   with L residual weakness/hemiparesis    GERD (gastroesophageal reflux disease)    HYPERLIPIDEMIA 12/10/2009   HYPERTENSION 12/10/2009   PFO (patent foramen ovale)    Stroke ([place]) 2008   denies residual on 06/09/2013   Tendonitis of wrist, right     Past Surgical History:  Procedure Laterality Date   COLONOSCOPY     COSMETIC SURGERY     OVARIAN CYST REMOVAL  1990's   UPPER GASTROINTESTINAL ENDOSCOPY      Social History:  reports that she has never smoked. She has never used smokeless tobacco. She reports that she does not drink alcohol and does not use drugs.  Allergies: Allergies  Allergen Reactions   Bactrim [Sulfamethoxazole-Trimethoprim] Nausea Only    Nausea    Penicillins Rash    Family History:  Family History  Problem Relation Age of Onset   Multiple myeloma Mother    Breast cancer Mother    Diabetes Mother    Diabetes Father    Breast cancer Maternal Aunt    Stomach cancer Maternal Aunt    Liver cancer Maternal Aunt    Prostate cancer Maternal Uncle    Colon cancer Neg Hx    Esophageal cancer Neg Hx    Rectal cancer  Neg Hx      Current Outpatient Medications:    acetaminophen (TYLENOL) 325 MG tablet, Take 2 tablets (650 mg total) by mouth every 6 (six) hours as needed., Disp: 20 tablet, Rfl: 0   albuterol (VENTOLIN HFA) 108 (90 Base) MCG/ACT inhaler, INHALE 2 PUFFS BY MOUTH EVERY 6 HOURS AS NEEDED, Disp: 9 g, Rfl: 2   amLODipine (NORVASC) 10 MG tablet, Take 1 tablet (10 mg total) by mouth daily., Disp: 90 tablet, Rfl: 1   atorvastatin (LIPITOR) 40 MG tablet, Take 1 tablet (40 mg total) by mouth at bedtime., Disp: 90 tablet, Rfl: 1   carvedilol (COREG) 3.125 MG tablet, TAKE 1 TABLET BY MOUTH TWICE DAILY WITH MEALS, Disp: 180 tablet, Rfl: 1   clopidogrel (PLAVIX) 75 MG tablet, Take 1 tablet by mouth once daily, Disp: 90 tablet, Rfl: 1   diphenhydrAMINE (BENADRYL) 25 MG tablet, Take 50 mg by mouth at bedtime as needed. , Disp: , Rfl:    FLOVENT DISKUS 100 MCG/ACT AEPB, INHALE 2 PUFFS BY MOUTH ONCE DAILY, Disp: 60 each, Rfl: 0   fluticasone (FLOVENT HFA) 110 MCG/ACT inhaler, INHALE 2 PUFFS IN THE MORNING AND AT BEDTIME, Disp: 12 g, Rfl: 0   levothyroxine (SYNTHROID) 25 MCG tablet, Take 1 tablet by mouth once daily, Disp: 90 tablet, Rfl: 1   losartan (COZAAR)  50 MG tablet, Take 1 tablet by mouth twice daily, Disp: 180 tablet, Rfl: 1   Multiple Vitamin (MULTIVITAMIN WITH MINERALS) TABS tablet, Take 1 tablet by mouth daily., Disp: , Rfl:    pantoprazole (PROTONIX) 40 MG tablet, Take 1 tablet (40 mg total) by mouth daily as needed., Disp: 90 tablet, Rfl: 1   polyethylene glycol (MIRALAX / GLYCOLAX) packet, Take 17 g by mouth daily. (Patient taking differently: Take 17 g by mouth daily as needed.), Disp: 30 each, Rfl: 1  Current Facility-Administered Medications:    IncobotulinumtoxinA SOLR 200 Units, 200 Units, Intramuscular, Once, Kirsteins, Luanna Salk, MD  Review of Systems:  Constitutional: Denies fever, chills, diaphoresis, appetite change and fatigue.  HEENT: Denies photophobia, eye pain, redness, hearing  loss, ear pain, congestion, sore throat, rhinorrhea, sneezing, mouth sores, trouble swallowing, neck pain, neck stiffness and tinnitus.   Respiratory: Denies SOB, DOE, cough, chest tightness,  and wheezing.   Cardiovascular: Denies chest pain, palpitations and leg swelling.  Gastrointestinal: Denies nausea, vomiting, abdominal pain, diarrhea, constipation, blood in stool and abdominal distention.  Genitourinary: Denies dysuria, urgency, frequency, hematuria, flank pain and difficulty urinating.  Endocrine: Denies: hot or cold intolerance, sweats, changes in hair or nails, polyuria, polydipsia. Musculoskeletal: Denies myalgias, back pain, joint swelling, arthralgias and gait problem.  Skin: Denies pallor, rash and wound.  Neurological: Denies dizziness, seizures, syncope,  light-headedness, numbness and headaches.  Hematological: Denies adenopathy. Easy bruising, personal or family bleeding history  Psychiatric/Behavioral: Denies suicidal ideation, mood changes, confusion, nervousness, sleep disturbance and agitation    Physical Exam: Vitals:   05/29/22 1048 05/29/22 1104  BP: 124/80 124/79  Pulse: 88   Temp: 98.2 F (36.8 C)   TempSrc: Oral   SpO2: 98%   Weight: 198 lb 1.6 oz (89.9 kg)     Body mass index is 32.97 kg/m.   Constitutional: NAD, calm, comfortable Eyes: PERRL, lids and conjunctivae normal, wears corrective lenses ENMT: Mucous membranes are moist.  Respiratory: clear to auscultation bilaterally, no wheezing, no crackles. Normal respiratory effort. No accessory muscle use.  Cardiovascular: Regular rate and rhythm, no murmurs / rubs / gallops. No extremity edema.  Neurologic: Left hemiplegia Psychiatric: Normal judgment and insight. Alert and oriented x 3. Normal mood.    Impression and Plan:  Controlled type 2 diabetes mellitus with other neurologic complication, without long-term current use of insulin ([place]) - Plan: POCT glycosylated hemoglobin (Hb A1C)  Needs  flu shot - Plan: Flu Vaccine QUAD 6+ mos PF IM (Fluarix Quad PF)  Spastic hemiplegia affecting nondominant side (HCC)  Pure hypercholesterolemia  Essential hypertension  -Blood pressure is well controlled. -A1c demonstrates excellent diabetic control at 6.4. -Her asthma has been well controlled on daily Flovent and as needed albuterol. -Last lipid panel was at goal with an LDL cholesterol of 49.  Time spent:31 minutes reviewing chart, interviewing and examining patient and formulating plan of care.      Lelon Frohlich, MD [facility]

## 2022-05-31 ENCOUNTER — Other Ambulatory Visit: Payer: Self-pay | Admitting: Internal Medicine

## 2022-06-10 ENCOUNTER — Encounter: Payer: Medicare HMO | Admitting: Physical Medicine & Rehabilitation

## 2022-06-25 ENCOUNTER — Encounter: Payer: Self-pay | Admitting: Internal Medicine

## 2022-06-25 ENCOUNTER — Ambulatory Visit (INDEPENDENT_AMBULATORY_CARE_PROVIDER_SITE_OTHER): Payer: Medicare HMO | Admitting: Internal Medicine

## 2022-06-25 VITALS — BP 130/80 | HR 88 | Temp 98.0°F | Wt 198.0 lb

## 2022-06-25 DIAGNOSIS — I63511 Cerebral infarction due to unspecified occlusion or stenosis of right middle cerebral artery: Secondary | ICD-10-CM

## 2022-06-25 DIAGNOSIS — L0212 Furuncle of neck: Secondary | ICD-10-CM | POA: Diagnosis not present

## 2022-06-25 DIAGNOSIS — I10 Essential (primary) hypertension: Secondary | ICD-10-CM

## 2022-06-25 DIAGNOSIS — R6889 Other general symptoms and signs: Secondary | ICD-10-CM | POA: Diagnosis not present

## 2022-06-25 MED ORDER — CLOPIDOGREL BISULFATE 75 MG PO TABS: 75.0000 mg | ORAL_TABLET | Freq: Every day | ORAL | 1 refills | Status: DC

## 2022-06-25 MED ORDER — CARVEDILOL 3.125 MG PO TABS: 3.1250 mg | ORAL_TABLET | Freq: Two times a day (BID) | ORAL | 1 refills | Status: DC

## 2022-06-25 NOTE — Progress Notes (Signed)
Acute office Visit     CC/Reason for Visit: "Boil on neck"  HPI: Rebecca Yoder is a 56 y.o. female who is coming in today for the above mentioned reasons.  For the past week she has noted pain on the posterior lower right side of her neck.  She recognized it as an abscess and started draining spontaneously 2 days ago.   Past Medical/Surgical History: Past Medical History:  Diagnosis Date   Anemia    ASTHMA 12/10/2009   Asthma    Cataract    CEREBROVASCULAR ACCIDENT, HX OF 02/11/4192   Complication of anesthesia    "just can't get me up" (06/09/2013)   Controlled type 2 diabetes mellitus without complication, without long-term current use of insulin (HCC)    no meds   Embolic stroke of right basal ganglia ([place]) 06/11/2015   with L residual weakness/hemiparesis    GERD (gastroesophageal reflux disease)    HYPERLIPIDEMIA 12/10/2009   HYPERTENSION 12/10/2009   PFO (patent foramen ovale)    Stroke ([place]) 2008   denies residual on 06/09/2013   Tendonitis of wrist, right     Past Surgical History:  Procedure Laterality Date   COLONOSCOPY     COSMETIC SURGERY     OVARIAN CYST REMOVAL  1990's   UPPER GASTROINTESTINAL ENDOSCOPY      Social History:  reports that she has never smoked. She has never used smokeless tobacco. She reports that she does not drink alcohol and does not use drugs.  Allergies: Allergies  Allergen Reactions   Bactrim [Sulfamethoxazole-Trimethoprim] Nausea Only    Nausea    Penicillins Rash    Family History:  Family History  Problem Relation Age of Onset   Multiple myeloma Mother    Breast cancer Mother    Diabetes Mother    Diabetes Father    Breast cancer Maternal Aunt    Stomach cancer Maternal Aunt    Liver cancer Maternal Aunt    Prostate cancer Maternal Uncle    Colon cancer Neg Hx    Esophageal cancer Neg Hx    Rectal cancer Neg Hx      Current Outpatient Medications:    acetaminophen (TYLENOL) 325 MG tablet, Take 2 tablets  (650 mg total) by mouth every 6 (six) hours as needed., Disp: 20 tablet, Rfl: 0   albuterol (VENTOLIN HFA) 108 (90 Base) MCG/ACT inhaler, INHALE 2 PUFFS BY MOUTH EVERY 6 HOURS AS NEEDED, Disp: 9 g, Rfl: 2   amLODipine (NORVASC) 10 MG tablet, Take 1 tablet (10 mg total) by mouth daily., Disp: 90 tablet, Rfl: 1   atorvastatin (LIPITOR) 40 MG tablet, Take 1 tablet (40 mg total) by mouth at bedtime., Disp: 90 tablet, Rfl: 1   diphenhydrAMINE (BENADRYL) 25 MG tablet, Take 50 mg by mouth at bedtime as needed. , Disp: , Rfl:    FLOVENT DISKUS 100 MCG/ACT AEPB, INHALE 2 PUFFS BY MOUTH ONCE DAILY, Disp: 60 each, Rfl: 0   fluticasone (FLOVENT HFA) 110 MCG/ACT inhaler, INHALE 2 PUFFS IN THE MORNING AND AT BEDTIME, Disp: 12 g, Rfl: 0   levothyroxine (SYNTHROID) 25 MCG tablet, Take 1 tablet by mouth once daily, Disp: 90 tablet, Rfl: 1   losartan (COZAAR) 50 MG tablet, Take 1 tablet by mouth twice daily, Disp: 180 tablet, Rfl: 1   Multiple Vitamin (MULTIVITAMIN WITH MINERALS) TABS tablet, Take 1 tablet by mouth daily., Disp: , Rfl:    pantoprazole (PROTONIX) 40 MG tablet, Take 1 tablet (40 mg  total) by mouth daily as needed., Disp: 90 tablet, Rfl: 1   polyethylene glycol (MIRALAX / GLYCOLAX) packet, Take 17 g by mouth daily. (Patient taking differently: Take 17 g by mouth daily as needed.), Disp: 30 each, Rfl: 1   carvedilol (COREG) 3.125 MG tablet, Take 1 tablet (3.125 mg total) by mouth 2 (two) times daily with a meal., Disp: 180 tablet, Rfl: 1   clopidogrel (PLAVIX) 75 MG tablet, Take 1 tablet (75 mg total) by mouth daily., Disp: 90 tablet, Rfl: 1  Current Facility-Administered Medications:    IncobotulinumtoxinA SOLR 200 Units, 200 Units, Intramuscular, Once, Kirsteins, Luanna Salk, MD  Review of Systems:  Constitutional: Denies fever, chills, diaphoresis, appetite change and fatigue.  HEENT: Denies photophobia, eye pain, redness, hearing loss, ear pain, congestion, sore throat, rhinorrhea, sneezing, mouth  sores, trouble swallowing, neck stiffness and tinnitus.   Respiratory: Denies SOB, DOE, cough, chest tightness,  and wheezing.   Cardiovascular: Denies chest pain, palpitations and leg swelling.  Gastrointestinal: Denies nausea, vomiting, abdominal pain, diarrhea, constipation, blood in stool and abdominal distention.  Genitourinary: Denies dysuria, urgency, frequency, hematuria, flank pain and difficulty urinating.  Endocrine: Denies: hot or cold intolerance, sweats, changes in hair or nails, polyuria, polydipsia. Musculoskeletal: Denies myalgias, back pain, joint swelling, arthralgias and gait problem.  Skin: Denies pallor, rash. Neurological: Denies dizziness, seizures, syncope, weakness, light-headedness, numbness and headaches.  Hematological: Denies adenopathy. Easy bruising, personal or family bleeding history  Psychiatric/Behavioral: Denies suicidal ideation, mood changes, confusion, nervousness, sleep disturbance and agitation    Physical Exam: Vitals:   06/25/22 0901  BP: 130/80  Pulse: 88  Temp: 98 F (36.7 C)  TempSrc: Oral  SpO2: 99%  Weight: 198 lb (89.8 kg)    Body mass index is 32.95 kg/m.   Constitutional: NAD, calm, comfortable Eyes: PERRL, lids and conjunctivae normal, wears corrective lenses ENMT: Mucous membranes are moist.  Skin: Small abscess on left posterior neck that is already draining spontaneously a small amount of purulent liquid.  I have provided some pressure that has increased drainage. Psychiatric: Normal judgment and insight. Alert and oriented x 3. Normal mood.    Impression and Plan:  Boil, neck  Essential hypertension - Plan: carvedilol (COREG) 3.125 MG tablet  Right middle cerebral artery stroke ([place]) - Plan: clopidogrel (PLAVIX) 75 MG tablet  -Small abscess on left posterior neck that is already draining spontaneously.  No need for antibiotics. -Refills of carvedilol and clopidogrel have been sent as requested.  Time spent:22  minutes reviewing chart, interviewing and examining patient and formulating plan of care.      Lelon Frohlich, MD [facility]

## 2022-07-10 ENCOUNTER — Encounter: Payer: Self-pay | Admitting: Physical Medicine & Rehabilitation

## 2022-07-10 ENCOUNTER — Encounter: Payer: Medicare HMO | Attending: Physical Medicine & Rehabilitation | Admitting: Physical Medicine & Rehabilitation

## 2022-07-10 VITALS — BP 119/74 | HR 80 | Ht 65.0 in | Wt 196.0 lb

## 2022-07-10 DIAGNOSIS — G8110 Spastic hemiplegia affecting unspecified side: Secondary | ICD-10-CM | POA: Insufficient documentation

## 2022-07-10 DIAGNOSIS — R6889 Other general symptoms and signs: Secondary | ICD-10-CM | POA: Diagnosis not present

## 2022-07-10 NOTE — Progress Notes (Signed)
Subjective:    Patient ID: Rebecca Yoder, female    DOB: January 16, 1966, 56 y.o.   MRN: [id number]  HPI 56 yo female with Right CVA causing left spastic hemiplegia in 2016, chronic severe spsticity LE> UE Pt notes improvement in toeflexor and foot inverter spasticity after Botox injection  No falls Also working on LUE shoulder ROM LEFT   Lateral gastroc 25U Soleus 25 U FDL 75U FHL 50U Post tib 25U Pain Inventory Average Pain 5 Pain Right Now 0 My pain is intermittent and sharp  LOCATION OF PAIN  left ankle & left foot  BOWEL Number of stools per week: 7   BLADDER Normal    Mobility use a cane ability to climb steps?  yes do you drive?  no use a wheelchair Do you have any goals in this area?  yes  Function disabled: date disabled 06/11/2015  Neuro/Psych trouble walking  Prior Studies Any changes since last visit?  no  Physicians involved in your care Any changes since last visit?  no.   Family History  Problem Relation Age of Onset   Multiple myeloma Mother    Breast cancer Mother    Diabetes Mother    Diabetes Father    Breast cancer Maternal Aunt    Stomach cancer Maternal Aunt    Liver cancer Maternal Aunt    Prostate cancer Maternal Uncle    Colon cancer Neg Hx    Esophageal cancer Neg Hx    Rectal cancer Neg Hx    Social History   Socioeconomic History   Marital status: Single    Spouse name: Not on file   Number of children: 0   Years of education: 12th   Highest education level: Not on file  Occupational History   Occupation: budd    Fish farm manager: [employer]  Tobacco Use   Smoking status: Never   Smokeless tobacco: Never  Vaping Use   Vaping Use: Never used  Substance and Sexual Activity   Alcohol use: No    Alcohol/week: 0.0 standard drinks of alcohol   Drug use: No   Sexual activity: Yes  Other Topics Concern   Not on file  Social History Narrative   Not on file   Social Determinants of Health   Financial  Resource Strain: Not on file  Food Insecurity: No Food Insecurity (05/14/2021)   Hunger Vital Sign    Worried About Running Out of Food in the Last Year: Never true    [place] in the Last Year: Never true  Transportation Needs: No Transportation Needs (05/14/2021)   PRAPARE - Hydrologist (Medical): No    Lack of Transportation (Non-Medical): No  Physical Activity: Sufficiently Active (05/14/2021)   Exercise Vital Sign    Days of Exercise per Week: 5 days    Minutes of Exercise per Session: 60 min  Stress: No Stress Concern Present (05/14/2021)   [place]    Feeling of Stress : Not at all  Social Connections: Not on file   Past Surgical History:  Procedure Laterality Date   COLONOSCOPY     COSMETIC SURGERY     OVARIAN CYST REMOVAL  1990's   UPPER GASTROINTESTINAL ENDOSCOPY     Past Medical History:  Diagnosis Date   Anemia    ASTHMA 12/10/2009   Asthma    Cataract    CEREBROVASCULAR ACCIDENT, HX OF 10/12/3830   Complication  of anesthesia    "just can't get me up" (06/09/2013)   Controlled type 2 diabetes mellitus without complication, without long-term current use of insulin ([place])    no meds   Embolic stroke of right basal ganglia ([place]) 06/11/2015   with L residual weakness/hemiparesis    GERD (gastroesophageal reflux disease)    HYPERLIPIDEMIA 12/10/2009   HYPERTENSION 12/10/2009   PFO (patent foramen ovale)    Stroke ([place]) 2008   denies residual on 06/09/2013   Tendonitis of wrist, right    Ht _0  (1.651 m)   Wt 196 lb (88.9 kg)   BMI 32.62 kg/m   Opioid Risk Score:   Fall Risk Score:  `1  Depression screen [facility] 2/9     07/10/2022   12:19 PM 06/25/2022    9:11 AM 05/29/2022   10:55 AM 03/13/2022    2:56 PM 02/26/2022   10:17 AM 01/21/2022    2:14 PM 12/06/2021   10:27 AM  Depression screen PHQ 2/9  Decreased Interest 0 3  0 0 0 0  Down, Depressed, Hopeless 0 0 0  0 0 0 0  PHQ - 2 Score 0 3 0 0 0 0 0  Altered sleeping  1 0  0    Tired, decreased energy  0 3  0    Change in appetite  0 2  0    Feeling bad or failure about yourself   0 0  0    Trouble concentrating  0 0  0    Moving slowly or fidgety/restless  0 0  0    Suicidal thoughts  0 0  0    PHQ-9 Score  4 5  0    Difficult doing work/chores  Not difficult at all Not difficult at all  Not difficult at all      Review of Systems  Musculoskeletal:  Positive for gait problem.       Pain left leg & left ankle       Objective:   Physical Exam  Ble to touch top of head with left hand  MMT 3/5 in Left Delt, Bi, tri, grip HF 4/5 knee ext 0 at ankle DF/PF  Tone MAS 2 in L foot invertors and ankle       Assessment & Plan:    Left spastic hemi, LLE spasticity  imporoved after injection of Botox ~2 mo ago Repeat Botox same dose and muscle group selection in January

## 2022-07-15 ENCOUNTER — Other Ambulatory Visit: Payer: Self-pay | Admitting: Internal Medicine

## 2022-07-18 ENCOUNTER — Ambulatory Visit
Admission: RE | Admit: 2022-07-18 | Discharge: 2022-07-18 | Disposition: A | Discharge status: Home / Self Care | Payer: Medicare HMO | Source: Ambulatory Visit | Attending: Internal Medicine | Admitting: Internal Medicine

## 2022-07-18 DIAGNOSIS — Z1231 Encounter for screening mammogram for malignant neoplasm of breast: Secondary | ICD-10-CM | POA: Diagnosis not present

## 2022-07-18 DIAGNOSIS — R6889 Other general symptoms and signs: Secondary | ICD-10-CM | POA: Diagnosis not present

## 2022-07-22 DIAGNOSIS — R6889 Other general symptoms and signs: Secondary | ICD-10-CM | POA: Diagnosis not present

## 2022-08-21 ENCOUNTER — Encounter: Payer: Medicare HMO | Attending: Physical Medicine & Rehabilitation | Admitting: Physical Medicine & Rehabilitation

## 2022-08-21 ENCOUNTER — Encounter: Payer: Self-pay | Admitting: Physical Medicine & Rehabilitation

## 2022-08-21 ENCOUNTER — Ambulatory Visit: Payer: Medicare HMO | Admitting: Physical Medicine & Rehabilitation

## 2022-08-21 VITALS — BP 133/82 | HR 86 | Ht 65.0 in | Wt 199.0 lb

## 2022-08-21 DIAGNOSIS — G8110 Spastic hemiplegia affecting unspecified side: Secondary | ICD-10-CM | POA: Insufficient documentation

## 2022-08-21 DIAGNOSIS — R6889 Other general symptoms and signs: Secondary | ICD-10-CM | POA: Diagnosis not present

## 2022-08-21 MED ORDER — ONABOTULINUMTOXINA 100 UNITS IJ SOLR
200.0000 [IU] | Freq: Once | INTRAMUSCULAR | Status: AC
  Administered 2022-08-21: 200 [IU] via INTRAMUSCULAR

## 2022-08-21 NOTE — Progress Notes (Signed)
Botox Injection for spasticity using needle EMG guidance  Dilution: 50 Units/ml Indication: Severe spasticity which interferes with ADL,mobility and/or  hygiene and is unresponsive to medication management and other conservative care Informed consent was obtained after describing risks and benefits of the procedure with the patient. This includes bleeding, bruising, infection, excessive weakness, or medication side effects. A REMS form is on file and signed. Needle: 25g 2" needle electrode Number of units per muscle Left lower ext  LEFT  LUE L Pronator teres 25U  LLE Medial gastroc 25U  FDL 75U FHL 50U Post tib 25U All injections were done after obtaining appropriate EMG activity and after negative drawback for blood. The patient tolerated the procedure well. Post procedure instructions were given. A followup appointment was made.

## 2022-08-21 NOTE — Patient Instructions (Signed)

## 2022-08-22 DIAGNOSIS — R6889 Other general symptoms and signs: Secondary | ICD-10-CM | POA: Diagnosis not present

## 2022-08-22 LAB — HM DIABETES EYE EXAM

## 2022-08-23 DIAGNOSIS — E119 Type 2 diabetes mellitus without complications: Secondary | ICD-10-CM | POA: Diagnosis not present

## 2022-08-28 ENCOUNTER — Ambulatory Visit: Payer: Medicare HMO | Admitting: Physical Medicine & Rehabilitation

## 2022-09-01 ENCOUNTER — Encounter: Payer: Self-pay | Admitting: Internal Medicine

## 2022-09-01 ENCOUNTER — Ambulatory Visit (INDEPENDENT_AMBULATORY_CARE_PROVIDER_SITE_OTHER): Payer: Medicare HMO | Admitting: Internal Medicine

## 2022-09-01 VITALS — BP 130/80 | HR 94 | Wt 200.3 lb

## 2022-09-01 DIAGNOSIS — I63511 Cerebral infarction due to unspecified occlusion or stenosis of right middle cerebral artery: Secondary | ICD-10-CM

## 2022-09-01 DIAGNOSIS — J4530 Mild persistent asthma, uncomplicated: Secondary | ICD-10-CM | POA: Diagnosis not present

## 2022-09-01 DIAGNOSIS — R6889 Other general symptoms and signs: Secondary | ICD-10-CM | POA: Diagnosis not present

## 2022-09-01 DIAGNOSIS — I10 Essential (primary) hypertension: Secondary | ICD-10-CM | POA: Diagnosis not present

## 2022-09-01 DIAGNOSIS — G8110 Spastic hemiplegia affecting unspecified side: Secondary | ICD-10-CM | POA: Diagnosis not present

## 2022-09-01 DIAGNOSIS — E039 Hypothyroidism, unspecified: Secondary | ICD-10-CM | POA: Diagnosis not present

## 2022-09-01 DIAGNOSIS — K219 Gastro-esophageal reflux disease without esophagitis: Secondary | ICD-10-CM

## 2022-09-01 DIAGNOSIS — E1149 Type 2 diabetes mellitus with other diabetic neurological complication: Secondary | ICD-10-CM | POA: Diagnosis not present

## 2022-09-01 DIAGNOSIS — E7800 Pure hypercholesterolemia, unspecified: Secondary | ICD-10-CM

## 2022-09-01 LAB — POCT GLYCOSYLATED HEMOGLOBIN (HGB A1C): Hemoglobin A1C: 6.5 % — AB (ref 4.0–5.6)

## 2022-09-01 MED ORDER — LOSARTAN POTASSIUM 50 MG PO TABS: 50.0000 mg | ORAL_TABLET | Freq: Two times a day (BID) | ORAL | 1 refills | Status: DC

## 2022-09-01 NOTE — Progress Notes (Signed)
Established Patient Office Visit     CC/Reason for Visit: 47-monthfollow-up chronic conditions  HPI: Rebecca Yoder a 57y.o. female who is coming in today for the above mentioned reasons. Past Medical History is significant for: ypertension, hyperlipidemia, GERD, type 2 diabetes, mild intermittent asthma, history of CVA with left spastic hemiplegia. She is doing well and has no acute concerns or complaints.  She is asking about a podiatry referral to assist with nail trimming.   Past Medical/Surgical History: Past Medical History:  Diagnosis Date   Anemia    ASTHMA 12/10/2009   Asthma    Cataract    CEREBROVASCULAR ACCIDENT, HX OF 53/10/8248  Complication of anesthesia    "just can't get me up" (06/09/2013)   Controlled type 2 diabetes mellitus without complication, without long-term current use of insulin (HCC)    no meds   Embolic stroke of right basal ganglia ([place] 06/11/2015   with L residual weakness/hemiparesis    GERD (gastroesophageal reflux disease)    HYPERLIPIDEMIA 12/10/2009   HYPERTENSION 12/10/2009   PFO (patent foramen ovale)    [place]   denies residual on 06/09/2013   Tendonitis of wrist, right     Past Surgical History:  Procedure Laterality Date   COLONOSCOPY     COSMETIC SURGERY     OVARIAN CYST REMOVAL  1990's   UPPER GASTROINTESTINAL ENDOSCOPY      Social History:  reports that she has never smoked. She has never used smokeless tobacco. She reports that she does not drink alcohol and does not use drugs.  Allergies: Allergies  Allergen Reactions   Bactrim [Sulfamethoxazole-Trimethoprim] Nausea Only    Nausea    Penicillins Rash    Family History:  Family History  Problem Relation Age of Onset   Multiple myeloma Mother    Breast cancer Mother    Diabetes Mother    Diabetes Father    Breast cancer Maternal Aunt    Stomach cancer Maternal Aunt    Liver cancer Maternal Aunt    Prostate cancer Maternal Uncle    Colon  cancer Neg Hx    Esophageal cancer Neg Hx    Rectal cancer Neg Hx      Current Outpatient Medications:    acetaminophen (TYLENOL) 325 MG tablet, Take 2 tablets (650 mg total) by mouth every 6 (six) hours as needed., Disp: 20 tablet, Rfl: 0   albuterol (VENTOLIN HFA) 108 (90 Base) MCG/ACT inhaler, INHALE 2 PUFFS BY MOUTH EVERY 6 HOURS AS NEEDED, Disp: 9 g, Rfl: 2   amLODipine (NORVASC) 10 MG tablet, Take 1 tablet (10 mg total) by mouth daily., Disp: 90 tablet, Rfl: 1   atorvastatin (LIPITOR) 40 MG tablet, Take 1 tablet (40 mg total) by mouth at bedtime., Disp: 90 tablet, Rfl: 1   carvedilol (COREG) 3.125 MG tablet, Take 1 tablet (3.125 mg total) by mouth 2 (two) times daily with a meal., Disp: 180 tablet, Rfl: 1   clopidogrel (PLAVIX) 75 MG tablet, Take 1 tablet (75 mg total) by mouth daily., Disp: 90 tablet, Rfl: 1   diphenhydrAMINE (BENADRYL) 25 MG tablet, Take 50 mg by mouth at bedtime as needed. , Disp: , Rfl:    fluticasone (FLOVENT HFA) 110 MCG/ACT inhaler, INHALE 2 PUFFS IN THE MORNING AND AT BEDTIME, Disp: 12 g, Rfl: 0   Fluticasone Propionate, Inhal, (FLOVENT DISKUS) 100 MCG/ACT AEPB, INHALE 2 PUFFS BY MOUTH ONCE DAILY, Disp: 60 each, Rfl: 1  levothyroxine (SYNTHROID) 25 MCG tablet, Take 1 tablet by mouth once daily, Disp: 90 tablet, Rfl: 1   losartan (COZAAR) 50 MG tablet, Take 1 tablet (50 mg total) by mouth 2 (two) times daily., Disp: 180 tablet, Rfl: 1   Multiple Vitamin (MULTIVITAMIN WITH MINERALS) TABS tablet, Take 1 tablet by mouth daily., Disp: , Rfl:    pantoprazole (PROTONIX) 40 MG tablet, Take 1 tablet (40 mg total) by mouth daily as needed., Disp: 90 tablet, Rfl: 1   polyethylene glycol (MIRALAX / GLYCOLAX) packet, Take 17 g by mouth daily. (Patient taking differently: Take 17 g by mouth daily as needed.), Disp: 30 each, Rfl: 1  Current Facility-Administered Medications:    IncobotulinumtoxinA SOLR 200 Units, 200 Units, Intramuscular, Once, Kirsteins, Luanna Salk,  MD  Review of Systems:  Negative unless indicated in HPI.   Physical Exam: Vitals:   09/01/22 1029  BP: 130/80  Pulse: 94  SpO2: 96%  Weight: 200 lb 4.8 oz (90.9 kg)    Body mass index is 33.33 kg/m.   Physical Exam Vitals reviewed.  Constitutional:      Appearance: Normal appearance.  HENT:     Head: Normocephalic and atraumatic.  Eyes:     Conjunctiva/sclera: Conjunctivae normal.     Pupils: Pupils are equal, round, and reactive to light.  Cardiovascular:     Rate and Rhythm: Normal rate and regular rhythm.  Pulmonary:     Effort: Pulmonary effort is normal.     Breath sounds: Normal breath sounds.  Skin:    General: Skin is warm and dry.  Neurological:     General: No focal deficit present.     Mental Status: She is alert and oriented to person, place, and time.  Psychiatric:        Mood and Affect: Mood normal.        Behavior: Behavior normal.        Thought Content: Thought content normal.        Judgment: Judgment normal.      Impression and Plan:  Controlled type 2 diabetes mellitus with other neurologic complication, without long-term current use of insulin ([place]) - Plan: POCT glycosylated hemoglobin (Hb A1C), Ambulatory referral to Podiatry  Essential hypertension - Plan: losartan (COZAAR) 50 MG tablet  Pure hypercholesterolemia  Acquired hypothyroidism  Gastroesophageal reflux disease, unspecified whether esophagitis present  Right middle cerebral artery stroke (HCC)  Spastic hemiplegia affecting nondominant side (HCC)  Mild persistent asthma without complication  -Blood pressure is fairly well-controlled. -A1c of 6.5 demonstrates excellent diabetic management. -Last LDL in July 2023 was at goal of 48. -Initiate podiatry referral to assist with trimming of hard, thick, yellow nails. -Asthma has been stable.  Time spent:30 minutes reviewing chart, interviewing and examining patient and formulating plan of care.     Lelon Frohlich, MD [facility]

## 2022-09-04 ENCOUNTER — Encounter: Payer: Self-pay | Admitting: Internal Medicine

## 2022-09-06 ENCOUNTER — Other Ambulatory Visit: Payer: Self-pay | Admitting: Internal Medicine

## 2022-09-06 DIAGNOSIS — E119 Type 2 diabetes mellitus without complications: Secondary | ICD-10-CM

## 2022-09-15 ENCOUNTER — Encounter: Payer: Self-pay | Admitting: Podiatry

## 2022-09-15 ENCOUNTER — Ambulatory Visit: Payer: Medicare HMO | Admitting: Podiatry

## 2022-09-15 DIAGNOSIS — Q828 Other specified congenital malformations of skin: Secondary | ICD-10-CM | POA: Diagnosis not present

## 2022-09-15 DIAGNOSIS — M79674 Pain in right toe(s): Secondary | ICD-10-CM | POA: Diagnosis not present

## 2022-09-15 DIAGNOSIS — R6889 Other general symptoms and signs: Secondary | ICD-10-CM | POA: Diagnosis not present

## 2022-09-15 DIAGNOSIS — E1149 Type 2 diabetes mellitus with other diabetic neurological complication: Secondary | ICD-10-CM

## 2022-09-15 DIAGNOSIS — M79675 Pain in left toe(s): Secondary | ICD-10-CM | POA: Diagnosis not present

## 2022-09-15 DIAGNOSIS — B351 Tinea unguium: Secondary | ICD-10-CM

## 2022-09-15 NOTE — Progress Notes (Signed)
  Subjective:  Patient ID: Rebecca Yoder, female    DOB: 1966/02/06,   MRN: [id number]  Chief Complaint  Patient presents with   Nail Problem     Diabetic foot care    57 y.o. female presents for concern of thickened elongated and painful nails that are difficult to trim. Requesting to have them trimmed today. Relates burning and tingling in their feet. Patient is diabetic and last A1c was  Lab Results  Component Value Date   HGBA1C 6.5 (A) 09/01/2022   .   PCP:  Isaac Bliss, Rayford Halsted, MD    . Denies any other pedal complaints. Denies n/v/f/c.   Past Medical History:  Diagnosis Date   Anemia    ASTHMA 12/10/2009   Asthma    Cataract    CEREBROVASCULAR ACCIDENT, HX OF 10/14/5730   Complication of anesthesia    "just can't get me up" (06/09/2013)   Controlled type 2 diabetes mellitus without complication, without long-term current use of insulin (HCC)    no meds   Embolic stroke of right basal ganglia ([place]) 06/11/2015   with L residual weakness/hemiparesis    GERD (gastroesophageal reflux disease)    HYPERLIPIDEMIA 12/10/2009   HYPERTENSION 12/10/2009   PFO (patent foramen ovale)    Stroke ([place]) 2008   denies residual on 06/09/2013   Tendonitis of wrist, right     Objective:  Physical Exam: Vascular: DP/PT pulses 2/4 bilateral. CFT <3 seconds. Absent hair growth on digits. Edema noted to bilateral lower extremities. Xerosis noted bilaterally.  Skin. No lacerations or abrasions bilateral feet. Nails 1-5 bilateral  are thickened discolored and elongated with subungual debris. Hyerkeratotic cored lesion noted to plantar midfoot on right.  Musculoskeletal: MMT 5/5 bilateral lower extremities in DF, PF, Inversion and Eversion. Deceased ROM in DF of ankle joint.  Neurological: Sensation intact to light touch. Protective sensation diminished bilateral.    Assessment:   1. Pain due to onychomycosis of toenails of both feet   2. Controlled type 2 diabetes mellitus with  other neurologic complication, without long-term current use of insulin ([place])   3. Porokeratosis      Plan:  Patient was evaluated and treated and all questions answered. -Discussed and educated patient on diabetic foot care, especially with  regards to the vascular, neurological and musculoskeletal systems.  -Stressed the importance of good glycemic control and the detriment of not  controlling glucose levels in relation to the foot. -Discussed supportive shoes at all times and checking feet regularly.  -Mechanically debrided all nails 1-5 bilateral using sterile nail nipper and filed with dremel without incident -Hyperkeratotic tissue debrided without incident.   -Answered all patient questions -Patient to return  in 3 months for at risk foot care -Patient advised to call the office if any problems or questions arise in the meantime.   Lorenda Peck, DPM

## 2022-09-25 ENCOUNTER — Telehealth: Payer: Self-pay | Admitting: Internal Medicine

## 2022-09-25 ENCOUNTER — Other Ambulatory Visit: Payer: Self-pay | Admitting: Internal Medicine

## 2022-09-25 ENCOUNTER — Telehealth: Payer: Self-pay | Admitting: *Deleted

## 2022-09-25 MED ORDER — ARNUITY ELLIPTA 100 MCG/ACT IN AEPB: 2.0000 | INHALATION_SPRAY | Freq: Two times a day (BID) | RESPIRATORY_TRACT | 2 refills | Status: DC

## 2022-09-25 MED ORDER — ARNUITY ELLIPTA 100 MCG/ACT IN AEPB: 1.0000 | INHALATION_SPRAY | Freq: Two times a day (BID) | RESPIRATORY_TRACT | 2 refills | Status: DC

## 2022-09-25 NOTE — Telephone Encounter (Signed)
Rx sent 

## 2022-09-25 NOTE — Telephone Encounter (Signed)
Says these inhalers are dosed for once daily usage. And if provider wants to do 2 puffs of 100 microgram it would work best to call in 200 microgram strength.

## 2022-09-25 NOTE — Telephone Encounter (Signed)
Left detailed message on machine for patient. Rx sent Medication list updated

## 2022-09-25 NOTE — Telephone Encounter (Signed)
fluticasone (FLOVENT HFA) 110 MCG/ACT inhaler requires a PA. Arnuity Ellipta does not.  Would you like a PA?

## 2022-10-03 ENCOUNTER — Encounter: Payer: Medicare HMO | Attending: Physical Medicine & Rehabilitation | Admitting: Physical Medicine & Rehabilitation

## 2022-10-03 ENCOUNTER — Encounter: Payer: Self-pay | Admitting: Physical Medicine & Rehabilitation

## 2022-10-03 VITALS — BP 134/78 | HR 95 | Ht 65.0 in | Wt 197.0 lb

## 2022-10-03 DIAGNOSIS — R6889 Other general symptoms and signs: Secondary | ICD-10-CM | POA: Diagnosis not present

## 2022-10-03 DIAGNOSIS — G8110 Spastic hemiplegia affecting unspecified side: Secondary | ICD-10-CM | POA: Diagnosis not present

## 2022-10-03 NOTE — Progress Notes (Signed)
Subjective:    Patient ID: Rebecca Yoder, female    DOB: 09-14-1965, 57 y.o.   MRN: [id number]  HPI 57 year old female with history of multiple strokes original stroke 2008 right MCA distribution, subsequent infarct in the basal ganglia on the right side. She has had chronic left hemiparesis affecting upper and lower limb to a similar degree.  She has continued with aggressive therapy and has been making some gains in terms of shoulder range of motion.  She is now able to touch the back of her head.  She is also working on fine motor and is now able to do finger thumb opposition. She has undergone botulinum toxin injection 6 weeks ago we added the pronator teres on the left side and she feels like she can supinate better. Her lower extremity spasticity has improved after the botulinum toxin injection. Able to tie shoes  Pain Inventory Average Pain 7 Pain Right Now 0 My pain is intermittent and sharp  LOCATION OF PAIN  left leg  BOWEL Number of stools per week: 2-4 Oral laxative use No    BLADDER Normal    Mobility use a cane ability to climb steps?  yes do you drive?  no  Function disabled: date disabled 2016 Do you have any goals in this area?  yes  Neuro/Psych trouble walking  Prior Studies Any changes since last visit?  no  Physicians involved in your care Any changes since last visit?  no   Family History  Problem Relation Age of Onset   Multiple myeloma Mother    Breast cancer Mother    Diabetes Mother    Diabetes Father    Breast cancer Maternal Aunt    Stomach cancer Maternal Aunt    Liver cancer Maternal Aunt    Prostate cancer Maternal Uncle    Colon cancer Neg Hx    Esophageal cancer Neg Hx    Rectal cancer Neg Hx    Social History   Socioeconomic History   Marital status: Single    Spouse name: Not on file   Number of children: 0   Years of education: 12th   Highest education level: Not on file  Occupational History   Occupation:  budd    Fish farm manager: [employer]  Tobacco Use   Smoking status: Never   Smokeless tobacco: Never  Vaping Use   Vaping Use: Never used  Substance and Sexual Activity   Alcohol use: No    Alcohol/week: 0.0 standard drinks of alcohol   Drug use: No   Sexual activity: Yes  Other Topics Concern   Not on file  Social History Narrative   Not on file   Social Determinants of Health   Financial Resource Strain: Not on file  Food Insecurity: No Food Insecurity (05/14/2021)   Hunger Vital Sign    Worried About Running Out of Food in the Last Year: Never true    Ran Out of Food in the Last Year: Never true  Transportation Needs: No Transportation Needs (05/14/2021)   PRAPARE - Hydrologist (Medical): No    Lack of Transportation (Non-Medical): No  Physical Activity: Sufficiently Active (05/14/2021)   Exercise Vital Sign    Days of Exercise per Week: 5 days    Minutes of Exercise per Session: 60 min  Stress: No Stress Concern Present (05/14/2021)   [place]    Feeling of Stress : Not at all  Social Connections: Not on file   Past Surgical History:  Procedure Laterality Date   COLONOSCOPY     COSMETIC SURGERY     OVARIAN CYST REMOVAL  1990's   UPPER GASTROINTESTINAL ENDOSCOPY     Past Medical History:  Diagnosis Date   Anemia    ASTHMA 12/10/2009   Asthma    Cataract    CEREBROVASCULAR ACCIDENT, HX OF [id number]   Complication of anesthesia    "just can't get me up" (06/09/2013)   Controlled type 2 diabetes mellitus without complication, without long-term current use of insulin (HCC)    no meds   Embolic stroke of right basal ganglia ([place]) 06/11/2015   with L residual weakness/hemiparesis    GERD (gastroesophageal reflux disease)    HYPERLIPIDEMIA 12/10/2009   HYPERTENSION 12/10/2009   PFO (patent foramen ovale)    Stroke ([place]) 2008   denies residual on 06/09/2013   Tendonitis of  wrist, right    Ht '5\' 5"'$  (1.651 m)   Wt 197 lb (89.4 kg)   BMI 32.78 kg/m   Opioid Risk Score:   Fall Risk Score:  `1  Depression screen [facility] 2/9     09/01/2022   11:32 AM 08/21/2022    1:25 PM 07/10/2022   12:19 PM 06/25/2022    9:11 AM 05/29/2022   10:55 AM 03/13/2022    2:56 PM 02/26/2022   10:17 AM  Depression screen PHQ 2/9  Decreased Interest 0 0 0 3  0 0  Down, Depressed, Hopeless 0 0 0 0 0 0 0  PHQ - 2 Score 0 0 0 3 0 0 0  Altered sleeping 0   1 0  0  Tired, decreased energy 0   0 3  0  Change in appetite 0   0 2  0  Feeling bad or failure about yourself  0   0 0  0  Trouble concentrating 0   0 0  0  Moving slowly or fidgety/restless 0   0 0  0  Suicidal thoughts 0   0 0  0  PHQ-9 Score 0   4 5  0  Difficult doing work/chores Not difficult at all   Not difficult at all Not difficult at all  Not difficult at all    Review of Systems  Musculoskeletal:  Positive for gait problem.  All other systems reviewed and are negative.     Objective:   Physical Exam   Left finger to thumb oppo to digits 2345 Motor strength on the left is 3 - at the deltoid 3 at the bicep and tricep 3+ finger flexors and extensors Left lower extremity strength is 3 - at the hip flexors 4 - knee extensors and trace ankle dorsiflexor plantar flexor Ambulates without assistive device she uses a left AFO with stifflegged gait  Able to touch back of head with left hand      Assessment & Plan:  History of right cortical and subcortical infarcts with chronic left spastic hemiparesis making improvements with motor control left upper extremity. Spasticity left upper and lower limb we will plan on the following botulinum toxin injection in 6 weeks LEFT  LUE L Pronator teres 50U  LLE  FDL 75U FHL 50U Post tib 25U

## 2022-10-23 ENCOUNTER — Other Ambulatory Visit: Payer: Self-pay | Admitting: Internal Medicine

## 2022-10-23 DIAGNOSIS — J4540 Moderate persistent asthma, uncomplicated: Secondary | ICD-10-CM

## 2022-10-28 ENCOUNTER — Other Ambulatory Visit: Payer: Self-pay | Admitting: Internal Medicine

## 2022-10-28 DIAGNOSIS — E039 Hypothyroidism, unspecified: Secondary | ICD-10-CM

## 2022-11-12 ENCOUNTER — Ambulatory Visit: Payer: Medicare HMO | Admitting: Family Medicine

## 2022-11-21 ENCOUNTER — Encounter: Payer: Self-pay | Admitting: Physical Medicine & Rehabilitation

## 2022-11-21 ENCOUNTER — Encounter: Payer: Medicare HMO | Attending: Physical Medicine & Rehabilitation | Admitting: Physical Medicine & Rehabilitation

## 2022-11-21 VITALS — BP 119/82 | HR 87 | Ht 65.0 in | Wt 201.0 lb

## 2022-11-21 DIAGNOSIS — G8110 Spastic hemiplegia affecting unspecified side: Secondary | ICD-10-CM | POA: Diagnosis not present

## 2022-11-21 DIAGNOSIS — G8114 Spastic hemiplegia affecting left nondominant side: Secondary | ICD-10-CM | POA: Diagnosis not present

## 2022-11-21 DIAGNOSIS — R6889 Other general symptoms and signs: Secondary | ICD-10-CM | POA: Diagnosis not present

## 2022-11-21 MED ORDER — ONABOTULINUMTOXINA 100 UNITS IJ SOLR
200.0000 [IU] | Freq: Once | INTRAMUSCULAR | Status: AC
  Administered 2022-11-21: 200 [IU] via INTRAMUSCULAR

## 2022-11-21 NOTE — Patient Instructions (Signed)
You received a Botox injection today. You may experience soreness at the needle injection sites. Please call us if any of the injection sites turns red after a couple days or if there is any drainage. You may experience muscle weakness as a result of Botox. This would improve with time but can take several weeks to improve. The Botox should start working in about one week. The Botox usually last 3 months. The injection can be repeated every 3 months as needed.   Try Diclofenac get 3-4 times a day for Right knee pain

## 2022-11-21 NOTE — Progress Notes (Signed)
Botox Injection for spasticity using needle EMG guidance  Dilution: 50 Units/ml Indication: Severe spasticity which interferes with ADL,mobility and/or  hygiene and is unresponsive to medication management and other conservative care Informed consent was obtained after describing risks and benefits of the procedure with the patient. This includes bleeding, bruising, infection, excessive weakness, or medication side effects. A REMS form is on file and signed. Needle: 25g 2" needle electrode Number of units per muscle Left lower ext  LEFT  LUE L Pronator teres 25U  LLE Medial gastroc 25U  FDL 75U FHL 50U Post tib 25U All injections were done after obtaining appropriate EMG activity and after negative drawback for blood. The patient tolerated the procedure well. Post procedure instructions were given. A followup appointment was made.   

## 2022-12-01 ENCOUNTER — Ambulatory Visit (INDEPENDENT_AMBULATORY_CARE_PROVIDER_SITE_OTHER): Payer: Medicare HMO | Admitting: Internal Medicine

## 2022-12-01 ENCOUNTER — Encounter: Payer: Self-pay | Admitting: Internal Medicine

## 2022-12-01 VITALS — BP 119/82 | HR 95 | Temp 97.5°F | Wt 198.5 lb

## 2022-12-01 DIAGNOSIS — J4530 Mild persistent asthma, uncomplicated: Secondary | ICD-10-CM | POA: Diagnosis not present

## 2022-12-01 DIAGNOSIS — E1149 Type 2 diabetes mellitus with other diabetic neurological complication: Secondary | ICD-10-CM

## 2022-12-01 DIAGNOSIS — E7800 Pure hypercholesterolemia, unspecified: Secondary | ICD-10-CM | POA: Diagnosis not present

## 2022-12-01 DIAGNOSIS — I10 Essential (primary) hypertension: Secondary | ICD-10-CM

## 2022-12-01 DIAGNOSIS — M25561 Pain in right knee: Secondary | ICD-10-CM

## 2022-12-01 DIAGNOSIS — E039 Hypothyroidism, unspecified: Secondary | ICD-10-CM | POA: Diagnosis not present

## 2022-12-01 DIAGNOSIS — I63511 Cerebral infarction due to unspecified occlusion or stenosis of right middle cerebral artery: Secondary | ICD-10-CM

## 2022-12-01 DIAGNOSIS — R6889 Other general symptoms and signs: Secondary | ICD-10-CM | POA: Diagnosis not present

## 2022-12-01 DIAGNOSIS — G8110 Spastic hemiplegia affecting unspecified side: Secondary | ICD-10-CM

## 2022-12-01 LAB — POCT GLYCOSYLATED HEMOGLOBIN (HGB A1C): Hemoglobin A1C: 6.7 % — AB (ref 4.0–5.6)

## 2022-12-01 NOTE — Progress Notes (Signed)
Established Patient Office Visit     CC/Reason for Visit: 57-month follow-up chronic conditions  HPI: Rebecca Yoder is a 57 y.o. female who is coming in today for the above mentioned reasons. Past Medical History is significant for: Pretension, hyperlipidemia, type 2 diabetes, asthma, GERD.  She has also had a right MCA CVA with resultant left hemiplegia.  For the past few weeks she has been having right knee pain.  She states the pain is right at the upper joint line.  It hurts worse when going down steps.  Also when extending the knee from a sitting position.   Past Medical/Surgical History: Past Medical History:  Diagnosis Date   Anemia    ASTHMA 12/10/2009   Asthma    Cataract    CEREBROVASCULAR ACCIDENT, HX OF 12/10/2009   Complication of anesthesia    "just can't get me up" (06/09/2013)   Controlled type 2 diabetes mellitus without complication, without long-term current use of insulin    no meds   Embolic stroke of right basal ganglia 06/11/2015   with L residual weakness/hemiparesis    GERD (gastroesophageal reflux disease)    HYPERLIPIDEMIA 12/10/2009   HYPERTENSION 12/10/2009   PFO (patent foramen ovale)    Stroke 2008   denies residual on 06/09/2013   Tendonitis of wrist, right     Past Surgical History:  Procedure Laterality Date   COLONOSCOPY     COSMETIC SURGERY     OVARIAN CYST REMOVAL  1990's   UPPER GASTROINTESTINAL ENDOSCOPY      Social History:  reports that she has never smoked. She has never used smokeless tobacco. She reports that she does not drink alcohol and does not use drugs.  Allergies: Allergies  Allergen Reactions   Bactrim [Sulfamethoxazole-Trimethoprim] Nausea Only    Nausea    Penicillins Rash    Family History:  Family History  Problem Relation Age of Onset   Multiple myeloma Mother    Breast cancer Mother    Diabetes Mother    Diabetes Father    Breast cancer Maternal Aunt    Stomach cancer Maternal Aunt    Liver  cancer Maternal Aunt    Prostate cancer Maternal Uncle    Colon cancer Neg Hx    Esophageal cancer Neg Hx    Rectal cancer Neg Hx      Current Outpatient Medications:    acetaminophen (TYLENOL) 325 MG tablet, Take 2 tablets (650 mg total) by mouth every 6 (six) hours as needed., Disp: 20 tablet, Rfl: 0   albuterol (VENTOLIN HFA) 108 (90 Base) MCG/ACT inhaler, Inhale into the lungs., Disp: , Rfl:    amLODipine (NORVASC) 10 MG tablet, Take 1 tablet (10 mg total) by mouth daily., Disp: 90 tablet, Rfl: 1   atorvastatin (LIPITOR) 40 MG tablet, TAKE 1 TABLET BY MOUTH AT BEDTIME, Disp: 90 tablet, Rfl: 1   carvedilol (COREG) 3.125 MG tablet, Take 1 tablet (3.125 mg total) by mouth 2 (two) times daily with a meal., Disp: 180 tablet, Rfl: 1   clopidogrel (PLAVIX) 75 MG tablet, Take 1 tablet (75 mg total) by mouth daily., Disp: 90 tablet, Rfl: 1   COMIRNATY syringe, , Disp: , Rfl:    diphenhydrAMINE (BENADRYL) 25 MG tablet, Take 50 mg by mouth at bedtime as needed. , Disp: , Rfl:    fluticasone (FLOVENT HFA) 110 MCG/ACT inhaler, INHALE 2 PUFFS IN THE MORNING AND AT BEDTIME, Disp: 12 g, Rfl: 0   Fluticasone Furoate (ARNUITY  ELLIPTA) 100 MCG/ACT AEPB, Inhale 1 puff into the lungs daily., Disp: 30 each, Rfl: 2   hydrALAZINE (APRESOLINE) 10 MG tablet, Take by mouth., Disp: , Rfl:    levothyroxine (SYNTHROID) 25 MCG tablet, Take 1 tablet by mouth once daily, Disp: 90 tablet, Rfl: 1   losartan (COZAAR) 100 MG tablet, Take by mouth., Disp: , Rfl:    losartan (COZAAR) 50 MG tablet, Take 1 tablet (50 mg total) by mouth 2 (two) times daily., Disp: 180 tablet, Rfl: 1   Multiple Vitamin (MULTIVITAMIN WITH MINERALS) TABS tablet, Take 1 tablet by mouth daily., Disp: , Rfl:    pantoprazole (PROTONIX) 40 MG tablet, Take 1 tablet (40 mg total) by mouth daily as needed., Disp: 90 tablet, Rfl: 1   polyethylene glycol (MIRALAX / GLYCOLAX) packet, Take 17 g by mouth daily. (Patient taking differently: Take 17 g by mouth  daily as needed.), Disp: 30 each, Rfl: 1   pravastatin (PRAVACHOL) 20 MG tablet, Take by mouth., Disp: , Rfl:   Current Facility-Administered Medications:    IncobotulinumtoxinA SOLR 200 Units, 200 Units, Intramuscular, Once, Kirsteins, Victorino Sparrow, MD  Review of Systems:  Negative unless indicated in HPI.   Physical Exam: Vitals:   12/01/22 1313 12/01/22 1314 12/01/22 1345  BP: (!) 150/79 139/79 119/82  Pulse: 95    Temp: (!) 97.5 F (36.4 C)    TempSrc: Oral    SpO2: 99%    Weight: 198 lb 8 oz (90 kg)      Body mass index is 33.03 kg/m.   Physical Exam Vitals reviewed.  Constitutional:      Appearance: Normal appearance.  HENT:     Head: Normocephalic and atraumatic.  Eyes:     Conjunctiva/sclera: Conjunctivae normal.     Pupils: Pupils are equal, round, and reactive to light.  Cardiovascular:     Rate and Rhythm: Normal rate and regular rhythm.  Pulmonary:     Effort: Pulmonary effort is normal.     Breath sounds: Normal breath sounds.  Skin:    General: Skin is warm and dry.  Neurological:     General: No focal deficit present.     Mental Status: She is alert and oriented to person, place, and time.  Psychiatric:        Mood and Affect: Mood normal.        Behavior: Behavior normal.        Thought Content: Thought content normal.        Judgment: Judgment normal.      Impression and Plan:  Controlled type 2 diabetes mellitus with other neurologic complication, without long-term current use of insulin - Plan: POC HgB A1c  Essential hypertension  Pure hypercholesterolemia  Mild persistent asthma without complication  Right middle cerebral artery stroke  Spastic hemiplegia affecting nondominant side  Acquired hypothyroidism  Acute pain of right knee - Plan: Ambulatory referral to Sports Medicine  -Suspect right knee pain is possibly femoral patellar syndrome.  Refer to sports medicine. -A1c of 6.7 demonstrates excellent diabetic control. -Blood  pressure is fairly well-controlled. -LDL is at goal as of last check in July 2023. -On levothyroxine supplementation.  Time spent:32 minutes reviewing chart, interviewing and examining patient and formulating plan of care.     Chaya Jan, MD [facility]

## 2022-12-03 NOTE — Progress Notes (Unsigned)
Rebecca Yoder D.[facility] [address] [place] [address] Phone: [phone]   Assessment and Plan:    1. Acute pain of right knee -Acute, uncomplicated, initial sports medicine visit - 3 to 4 weeks of ongoing right knee pain that worsens trying to move after inactivity - Most consistent with overuse with patient having history of CVA affecting left side, and patient having to use her right leg as her lead leg - Start meloxicam 15 mg daily x2 weeks.  If still having pain after 2 weeks, complete 3rd-week of meloxicam. May use remaining meloxicam as needed once daily for pain control.  Do not to use additional NSAIDs while taking meloxicam.  May use Tylenol [phone] 2 to 3 times a day for breakthrough pain. - Start HEP for knee  Other orders - meloxicam (MOBIC) 15 MG tablet; Take 1 tablet (15 mg total) by mouth daily.    Pertinent previous records reviewed include none   Follow Up: 4 weeks for reevaluation.  If no improvement or worsening of symptoms, would obtain x-ray and could consider CSI versus physical therapy   Subjective:   I, Arlyss Weathersby, am serving as a Neurosurgeon for Doctor Richardean Sale  Chief Complaint: right knee pain   HPI:   12/04/2022 Patient is a 57 year old female complaining of right knee pain. Patient states not aware of any MOI for the knee pain. Patient states the pain comes and goes and started a couple of weeks ago. Has trouble trying to sit in a chair and getting up. Hurts to go up and down the stairs. No pain when walking. Patient locates pain to mid patella.   Relevant Historical Information: History of CVA with left-sided weakness and left AFO, hypertension, GERD, DM type II  Additional pertinent review of systems negative.   Current Outpatient Medications:    acetaminophen (TYLENOL) 325 MG tablet, Take 2 tablets (650 mg total) by mouth every 6 (six) hours as needed., Disp: 20 tablet, Rfl: 0   albuterol  (VENTOLIN HFA) 108 (90 Base) MCG/ACT inhaler, Inhale into the lungs., Disp: , Rfl:    amLODipine (NORVASC) 10 MG tablet, Take 1 tablet (10 mg total) by mouth daily., Disp: 90 tablet, Rfl: 1   atorvastatin (LIPITOR) 40 MG tablet, TAKE 1 TABLET BY MOUTH AT BEDTIME, Disp: 90 tablet, Rfl: 1   carvedilol (COREG) 3.125 MG tablet, Take 1 tablet (3.125 mg total) by mouth 2 (two) times daily with a meal., Disp: 180 tablet, Rfl: 1   clopidogrel (PLAVIX) 75 MG tablet, Take 1 tablet (75 mg total) by mouth daily., Disp: 90 tablet, Rfl: 1   COMIRNATY syringe, , Disp: , Rfl:    diphenhydrAMINE (BENADRYL) 25 MG tablet, Take 50 mg by mouth at bedtime as needed. , Disp: , Rfl:    fluticasone (FLOVENT HFA) 110 MCG/ACT inhaler, INHALE 2 PUFFS IN THE MORNING AND AT BEDTIME, Disp: 12 g, Rfl: 0   Fluticasone Furoate (ARNUITY ELLIPTA) 100 MCG/ACT AEPB, Inhale 1 puff into the lungs daily., Disp: 30 each, Rfl: 2   hydrALAZINE (APRESOLINE) 10 MG tablet, Take by mouth., Disp: , Rfl:    levothyroxine (SYNTHROID) 25 MCG tablet, Take 1 tablet by mouth once daily, Disp: 90 tablet, Rfl: 1   losartan (COZAAR) 100 MG tablet, Take by mouth., Disp: , Rfl:    losartan (COZAAR) 50 MG tablet, Take 1 tablet (50 mg total) by mouth 2 (two) times daily., Disp: 180 tablet, Rfl: 1  meloxicam (MOBIC) 15 MG tablet, Take 1 tablet (15 mg total) by mouth daily., Disp: 30 tablet, Rfl: 0   Multiple Vitamin (MULTIVITAMIN WITH MINERALS) TABS tablet, Take 1 tablet by mouth daily., Disp: , Rfl:    pantoprazole (PROTONIX) 40 MG tablet, Take 1 tablet (40 mg total) by mouth daily as needed., Disp: 90 tablet, Rfl: 1   polyethylene glycol (MIRALAX / GLYCOLAX) packet, Take 17 g by mouth daily. (Patient taking differently: Take 17 g by mouth daily as needed.), Disp: 30 each, Rfl: 1   pravastatin (PRAVACHOL) 20 MG tablet, Take by mouth., Disp: , Rfl:   Current Facility-Administered Medications:    IncobotulinumtoxinA SOLR 200 Units, 200 Units, Intramuscular,  Once, Kirsteins, Victorino Sparrow, MD   Objective:     Vitals:   12/04/22 1437  BP: 118/72  Weight: 200 lb (90.7 kg)  Height:  (1.651 m)      Body mass index is 33.28 kg/m.    Physical Exam:    General:  awake, alert oriented, no acute distress nontoxic Skin: no suspicious lesions or rashes Neuro:sensation intact and strength 5/5 with no deficits, no atrophy, normal muscle tone Psych: No signs of anxiety, depression or other mood disorder  Right knee: No swelling No deformity Neg fluid wave, joint milking ROM Flex 110, Ext 0 TTP medial femoral condyle, medial joint line NTTP over the quad tendon,  lat fem condyle, patella, plica, patella tendon, tibial tuberostiy, fibular head, posterior fossa, pes anserine bursa, gerdy's tubercle,  lateral jt line Neg anterior and posterior drawer Neg lachman Neg sag sign Negative varus stress Negative valgus stress Negative McMurray Negative Thessaly  Gait normal    Electronically signed by:  Rebecca Yoder D.[facility] 3:56 PM 12/04/22

## 2022-12-04 ENCOUNTER — Ambulatory Visit: Payer: Medicare HMO | Admitting: Sports Medicine

## 2022-12-04 VITALS — BP 118/72 | Ht 65.0 in | Wt 200.0 lb

## 2022-12-04 DIAGNOSIS — M25561 Pain in right knee: Secondary | ICD-10-CM | POA: Diagnosis not present

## 2022-12-04 MED ORDER — MELOXICAM 15 MG PO TABS: 15.0000 mg | ORAL_TABLET | Freq: Every day | ORAL | 0 refills | Status: AC

## 2022-12-04 NOTE — Patient Instructions (Signed)
Good to see you - Start meloxicam 15 mg daily x2 weeks.  If still having pain after 2 weeks, complete 3rd-week of meloxicam. May use remaining meloxicam as needed once daily for pain control.  Do not to use additional NSAIDs while taking meloxicam.  May use Tylenol [phone] mg 2 to 3 times a day for breakthrough pain.  Knee exercises given Follow up in 4 weeks

## 2022-12-05 ENCOUNTER — Ambulatory Visit: Payer: Medicare HMO | Admitting: Sports Medicine

## 2022-12-15 ENCOUNTER — Other Ambulatory Visit: Payer: Self-pay | Admitting: Internal Medicine

## 2022-12-27 ENCOUNTER — Other Ambulatory Visit: Payer: Self-pay | Admitting: Internal Medicine

## 2022-12-27 DIAGNOSIS — I63511 Cerebral infarction due to unspecified occlusion or stenosis of right middle cerebral artery: Secondary | ICD-10-CM

## 2022-12-27 DIAGNOSIS — I10 Essential (primary) hypertension: Secondary | ICD-10-CM

## 2022-12-31 ENCOUNTER — Other Ambulatory Visit: Payer: Self-pay | Admitting: Internal Medicine

## 2022-12-31 NOTE — Progress Notes (Signed)
Rebecca Yoder D.[facility] [address] [place] [address] Phone: [phone]   Assessment and Plan:     1. Acute pain of right knee  -Acute, uncomplicated, subsequent visit - Overall   significant improvement after completing 2-3 course of meloxicam and HEP - Discontinue meloxicam and use remainder as needed - Continue HEP - May start Tylenol as needed for day-to-day pain relief  Pertinent previous records reviewed include none   Follow Up: As needed   Subjective:   I, Rebecca Yoder, am serving as a Neurosurgeon for Doctor Richardean Sale   Chief Complaint: right knee pain    HPI:    12/04/2022 Patient is a 57 year old female complaining of right knee pain. Patient states not aware of any MOI for the knee pain. Patient states the pain comes and goes and started a couple of weeks ago. Has trouble trying to sit in a chair and getting up. Hurts to go up and down the stairs. No pain when walking. Patient locates pain to mid patella.   01/01/2023 Patient states that she is doing well     Relevant Historical Information: History of CVA with left-sided weakness and left AFO, hypertension, GERD, DM type II  Additional pertinent review of systems negative.   Current Outpatient Medications:    acetaminophen (TYLENOL) 325 MG tablet, Take 2 tablets (650 mg total) by mouth every 6 (six) hours as needed., Disp: 20 tablet, Rfl: 0   albuterol (VENTOLIN HFA) 108 (90 Base) MCG/ACT inhaler, INHALE 2 PUFFS BY MOUTH EVERY 6 HOURS AS NEEDED, Disp: 9 g, Rfl: 0   amLODipine (NORVASC) 10 MG tablet, Take 1 tablet (10 mg total) by mouth daily., Disp: 90 tablet, Rfl: 1   ARNUITY ELLIPTA 100 MCG/ACT AEPB, Inhale 1 puff by mouth once daily, Disp: 30 each, Rfl: 0   atorvastatin (LIPITOR) 40 MG tablet, TAKE 1 TABLET BY MOUTH AT BEDTIME, Disp: 90 tablet, Rfl: 1   carvedilol (COREG) 3.125 MG tablet, TAKE 1 TABLET BY MOUTH TWICE DAILY WITH A MEAL, Disp: 180 tablet, Rfl:  1   clopidogrel (PLAVIX) 75 MG tablet, Take 1 tablet by mouth once daily, Disp: 90 tablet, Rfl: 1   COMIRNATY syringe, , Disp: , Rfl:    diphenhydrAMINE (BENADRYL) 25 MG tablet, Take 50 mg by mouth at bedtime as needed. , Disp: , Rfl:    fluticasone (FLOVENT HFA) 110 MCG/ACT inhaler, INHALE 2 PUFFS IN THE MORNING AND AT BEDTIME, Disp: 12 g, Rfl: 0   hydrALAZINE (APRESOLINE) 10 MG tablet, Take by mouth., Disp: , Rfl:    levothyroxine (SYNTHROID) 25 MCG tablet, Take 1 tablet by mouth once daily, Disp: 90 tablet, Rfl: 1   losartan (COZAAR) 100 MG tablet, Take by mouth., Disp: , Rfl:    losartan (COZAAR) 50 MG tablet, Take 1 tablet (50 mg total) by mouth 2 (two) times daily., Disp: 180 tablet, Rfl: 1   meloxicam (MOBIC) 15 MG tablet, Take 1 tablet (15 mg total) by mouth daily., Disp: 30 tablet, Rfl: 0   Multiple Vitamin (MULTIVITAMIN WITH MINERALS) TABS tablet, Take 1 tablet by mouth daily., Disp: , Rfl:    pantoprazole (PROTONIX) 40 MG tablet, Take 1 tablet (40 mg total) by mouth daily as needed., Disp: 90 tablet, Rfl: 1   polyethylene glycol (MIRALAX / GLYCOLAX) packet, Take 17 g by mouth daily. (Patient taking differently: Take 17 g by mouth daily as needed.), Disp: 30 each, Rfl: 1   pravastatin (PRAVACHOL)  20 MG tablet, Take by mouth., Disp: , Rfl:   Current Facility-Administered Medications:    IncobotulinumtoxinA SOLR 200 Units, 200 Units, Intramuscular, Once, Kirsteins, Victorino Sparrow, MD   Objective:     Vitals:   01/01/23 0937  Weight: 200 lb (90.7 kg)  Height: 5\' 5"  (1.651 m)      Body mass index is 33.28 kg/m.    Physical Exam:    General:  awake, alert oriented, no acute distress nontoxic Skin: no suspicious lesions or rashes Neuro:sensation intact and strength 5/5 with no deficits, no atrophy, normal muscle tone Psych: No signs of anxiety, depression or other mood disorder  Right knee: Crepitus present.  Nonpainful No swelling No deformity Neg fluid wave, joint milking ROM  Flex 110, Ext 0 NTTP over the quad tendon, medial fem condyle, lat fem condyle, patella, plica, patella tendon, tibial tuberostiy, fibular head, posterior fossa, pes anserine bursa, gerdy's tubercle, medial jt line, lateral jt line Neg anterior and posterior drawer Neg lachman Neg sag sign Negative varus stress Negative valgus stress Negative McMurray Negative Thessaly  Gait normal    Electronically signed by:  Rebecca Yoder [facility] 9:40 AM 01/01/23

## 2023-01-01 ENCOUNTER — Ambulatory Visit: Payer: Medicare HMO | Admitting: Sports Medicine

## 2023-01-01 VITALS — BP 124/72 | HR 76 | Ht 65.0 in | Wt 200.0 lb

## 2023-01-01 DIAGNOSIS — M25561 Pain in right knee: Secondary | ICD-10-CM

## 2023-01-01 DIAGNOSIS — R6889 Other general symptoms and signs: Secondary | ICD-10-CM | POA: Diagnosis not present

## 2023-01-01 NOTE — Patient Instructions (Signed)
Good to see you   

## 2023-01-02 ENCOUNTER — Encounter: Payer: Self-pay | Admitting: Physical Medicine & Rehabilitation

## 2023-01-02 ENCOUNTER — Encounter: Payer: Medicare HMO | Attending: Physical Medicine & Rehabilitation | Admitting: Physical Medicine & Rehabilitation

## 2023-01-02 VITALS — BP 124/76 | HR 83 | Ht 65.0 in | Wt 203.0 lb

## 2023-01-02 DIAGNOSIS — G8110 Spastic hemiplegia affecting unspecified side: Secondary | ICD-10-CM | POA: Insufficient documentation

## 2023-01-02 DIAGNOSIS — R6889 Other general symptoms and signs: Secondary | ICD-10-CM | POA: Diagnosis not present

## 2023-01-02 NOTE — Progress Notes (Signed)
Subjective:    Patient ID: Rebecca Yoder, female    DOB: 20-Nov-1965, 57 y.o.   MRN: [id number]  HPI 57 year old female with history of multiple strokes with left spastic hemiplegia.  She has gone through inpatient as well as outpatient therapy.  She has been getting good relief with botulinum toxin injections in the left upper limb and the here for follow-up for spasticity management Has woken up the last few nights with Left elbow flexor tightness  LEFT  LUE L Pronator teres 25U   LLE Medial gastroc 25U   FDL 75U FHL 50U Post tib 25U  Pain Inventory Average Pain 3 Pain Right Now 0 My pain is intermittent and sharp  In the last 24 hours, has pain interfered with the following? General activity 10 Relation with others 9 Enjoyment of life 10 What TIME of day is your pain at its worst? evening and night Sleep (in general) Good  Pain is worse with: inactivity Pain improves with: therapy/exercise and botox Relief from Meds: 10      Family History  Problem Relation Age of Onset   Multiple myeloma Mother    Breast cancer Mother    Diabetes Mother    Diabetes Father    Breast cancer Maternal Aunt    Stomach cancer Maternal Aunt    Liver cancer Maternal Aunt    Prostate cancer Maternal Uncle    Colon cancer Neg Hx    Esophageal cancer Neg Hx    Rectal cancer Neg Hx    Social History   Socioeconomic History   Marital status: Single    Spouse name: Not on file   Number of children: 0   Years of education: 12th   Highest education level: Not on file  Occupational History   Occupation: budd    Associate Professor: [employer]  Tobacco Use   Smoking status: Never   Smokeless tobacco: Never  Vaping Use   Vaping Use: Never used  Substance and Sexual Activity   Alcohol use: No    Alcohol/week: 0.0 standard drinks of alcohol   Drug use: No   Sexual activity: Yes  Other Topics Concern   Not on file  Social History Narrative   Not on file   Social  Determinants of Health   Financial Resource Strain: Not on file  Food Insecurity: No Food Insecurity (05/14/2021)   Hunger Vital Sign    Worried About Running Out of Food in the Last Year: Never true    Ran Out of Food in the Last Year: Never true  Transportation Needs: No Transportation Needs (05/14/2021)   PRAPARE - Administrator, Civil Service (Medical): No    Lack of Transportation (Non-Medical): No  Physical Activity: Sufficiently Active (05/14/2021)   Exercise Vital Sign    Days of Exercise per Week: 5 days    Minutes of Exercise per Session: 60 min  Stress: No Stress Concern Present (05/14/2021)   [employer] of Occupational Health - Occupational Stress Questionnaire    Feeling of Stress : Not at all  Social Connections: Not on file   Past Surgical History:  Procedure Laterality Date   COLONOSCOPY     COSMETIC SURGERY     OVARIAN CYST REMOVAL  1990's   UPPER GASTROINTESTINAL ENDOSCOPY     Past Medical History:  Diagnosis Date   Anemia    ASTHMA 12/10/2009   Asthma    Cataract    CEREBROVASCULAR ACCIDENT, HX OF 12/10/2009  Complication of anesthesia    "just can't get me up" (06/09/2013)   Controlled type 2 diabetes mellitus without complication, without long-term current use of insulin (HCC)    no meds   Embolic stroke of right basal ganglia (HCC) 06/11/2015   with L residual weakness/hemiparesis    GERD (gastroesophageal reflux disease)    HYPERLIPIDEMIA 12/10/2009   HYPERTENSION 12/10/2009   PFO (patent foramen ovale)    Stroke (HCC) 2008   denies residual on 06/09/2013   Tendonitis of wrist, right    BP 124/76   Pulse 83   Ht 5\' 5"  (1.651 m)   Wt 203 lb (92.1 kg)   SpO2 97%   BMI 33.78 kg/m   Opioid Risk Score:   Fall Risk Score:  `1  Depression screen PHQ 2/9     01/02/2023    1:17 PM 12/01/2022    1:18 PM 11/21/2022    1:06 PM 10/03/2022    1:44 PM 09/01/2022   11:32 AM 08/21/2022    1:25 PM 07/10/2022   12:19 PM  Depression screen  PHQ 2/9  Decreased Interest 0 3 0 0 0 0 0  Down, Depressed, Hopeless 0 0 0 0 0 0 0  PHQ - 2 Score 0 3 0 0 0 0 0  Altered sleeping  0   0    Tired, decreased energy  0   0    Change in appetite  0   0    Feeling bad or failure about yourself   0   0    Trouble concentrating  1   0    Moving slowly or fidgety/restless  0   0    Suicidal thoughts  0   0    PHQ-9 Score  4   0    Difficult doing work/chores  Not difficult at all   Not difficult at all      Review of Systems  Musculoskeletal:        Inner  All other systems reviewed and are negative.     Objective:   Physical Exam Tone Left upper extremity Pronator teres MAS 3 Finger flexor MAS 1 Wrist flexor MAS 1 Elbow flexor MAS 1 Left lower extremity hamstring MAS 0 Ankle plantar flexors MAS 1 No clonus at the ankle With standing she has minimal clawing of the toes and no foot inversion.  Motor strength is 3 - left deltoid bicep tricep finger flexors and extensors 4 - at the left hip flexor knee extensor trace ankle dorsiflexor plantar flexor.     Assessment & Plan:   1.  Left spastic hemiplegia has good results with botulinum toxin injection.  Would increase dosage to the left pronator teres Will not inject the gastroc next visit.  LEFT  LUE L Pronator teres 50U   LLE    FDL 75U FHL 50U Post tib 25U

## 2023-01-10 ENCOUNTER — Other Ambulatory Visit: Payer: Self-pay | Admitting: Internal Medicine

## 2023-01-21 DIAGNOSIS — R6889 Other general symptoms and signs: Secondary | ICD-10-CM | POA: Diagnosis not present

## 2023-01-25 ENCOUNTER — Other Ambulatory Visit: Payer: Self-pay | Admitting: Internal Medicine

## 2023-01-25 DIAGNOSIS — I10 Essential (primary) hypertension: Secondary | ICD-10-CM

## 2023-02-04 DIAGNOSIS — H524 Presbyopia: Secondary | ICD-10-CM | POA: Diagnosis not present

## 2023-02-07 ENCOUNTER — Other Ambulatory Visit: Payer: Self-pay | Admitting: Internal Medicine

## 2023-02-24 ENCOUNTER — Encounter: Payer: Self-pay | Admitting: Physical Medicine & Rehabilitation

## 2023-02-24 ENCOUNTER — Encounter: Payer: Medicare HMO | Attending: Physical Medicine & Rehabilitation | Admitting: Physical Medicine & Rehabilitation

## 2023-02-24 VITALS — BP 151/79 | HR 83 | Ht 65.0 in | Wt 197.0 lb

## 2023-02-24 DIAGNOSIS — G8110 Spastic hemiplegia affecting unspecified side: Secondary | ICD-10-CM | POA: Diagnosis not present

## 2023-02-24 DIAGNOSIS — R6889 Other general symptoms and signs: Secondary | ICD-10-CM | POA: Diagnosis not present

## 2023-02-24 MED ORDER — SODIUM CHLORIDE (PF) 0.9 % IJ SOLN
4.0000 mL | Freq: Once | INTRAMUSCULAR | Status: AC
  Administered 2023-02-24: 4 mL via INTRAVENOUS

## 2023-02-24 MED ORDER — ONABOTULINUMTOXINA 100 UNITS IJ SOLR
200.0000 [IU] | Freq: Once | INTRAMUSCULAR | Status: AC
  Administered 2023-02-24: 200 [IU] via INTRAMUSCULAR

## 2023-02-24 NOTE — Progress Notes (Signed)
Botox Injection for spasticity using needle EMG guidance  Dilution: 50 Units/ml Indication: Severe spasticity which interferes with ADL,mobility and/or  hygiene and is unresponsive to medication management and other conservative care Informed consent was obtained after describing risks and benefits of the procedure with the patient. This includes bleeding, bruising, infection, excessive weakness, or medication side effects. A REMS form is on file and signed. Needle: 25g 2" needle electrode Number of units per muscle  LUE L Pronator teres 50U   LLE    FDL 75U FHL 50U Post tib 25U All injections were done after obtaining appropriate EMG activity and after negative drawback for blood. The patient tolerated the procedure well. Post procedure instructions were given. A followup appointment was made.

## 2023-03-03 ENCOUNTER — Encounter: Payer: Medicare HMO | Admitting: Internal Medicine

## 2023-03-11 ENCOUNTER — Encounter (INDEPENDENT_AMBULATORY_CARE_PROVIDER_SITE_OTHER): Payer: Self-pay

## 2023-03-13 ENCOUNTER — Other Ambulatory Visit: Payer: Self-pay | Admitting: Internal Medicine

## 2023-03-16 ENCOUNTER — Ambulatory Visit: Payer: Medicare HMO | Admitting: Podiatry

## 2023-03-17 ENCOUNTER — Other Ambulatory Visit: Payer: Self-pay | Admitting: Internal Medicine

## 2023-03-17 DIAGNOSIS — E119 Type 2 diabetes mellitus without complications: Secondary | ICD-10-CM

## 2023-03-30 ENCOUNTER — Encounter: Payer: Self-pay | Admitting: Internal Medicine

## 2023-03-30 ENCOUNTER — Ambulatory Visit: Payer: Medicare HMO | Admitting: Internal Medicine

## 2023-03-30 VITALS — BP 120/78 | HR 73 | Temp 98.4°F | Ht 65.0 in | Wt 194.8 lb

## 2023-03-30 DIAGNOSIS — E119 Type 2 diabetes mellitus without complications: Secondary | ICD-10-CM

## 2023-03-30 DIAGNOSIS — Z0000 Encounter for general adult medical examination without abnormal findings: Secondary | ICD-10-CM | POA: Diagnosis not present

## 2023-03-30 DIAGNOSIS — Z1159 Encounter for screening for other viral diseases: Secondary | ICD-10-CM

## 2023-03-30 DIAGNOSIS — E039 Hypothyroidism, unspecified: Secondary | ICD-10-CM | POA: Diagnosis not present

## 2023-03-30 DIAGNOSIS — E1149 Type 2 diabetes mellitus with other diabetic neurological complication: Secondary | ICD-10-CM | POA: Diagnosis not present

## 2023-03-30 DIAGNOSIS — T753XXA Motion sickness, initial encounter: Secondary | ICD-10-CM | POA: Diagnosis not present

## 2023-03-30 DIAGNOSIS — Z8673 Personal history of transient ischemic attack (TIA), and cerebral infarction without residual deficits: Secondary | ICD-10-CM | POA: Diagnosis not present

## 2023-03-30 DIAGNOSIS — I10 Essential (primary) hypertension: Secondary | ICD-10-CM | POA: Diagnosis not present

## 2023-03-30 DIAGNOSIS — I63511 Cerebral infarction due to unspecified occlusion or stenosis of right middle cerebral artery: Secondary | ICD-10-CM

## 2023-03-30 DIAGNOSIS — E7800 Pure hypercholesterolemia, unspecified: Secondary | ICD-10-CM | POA: Diagnosis not present

## 2023-03-30 DIAGNOSIS — R6889 Other general symptoms and signs: Secondary | ICD-10-CM | POA: Diagnosis not present

## 2023-03-30 LAB — CBC WITH DIFFERENTIAL/PLATELET
Basophils Absolute: 0 10*3/uL (ref 0.0–0.1)
Basophils Relative: 0.2 % (ref 0.0–3.0)
Eosinophils Absolute: 0.1 10*3/uL (ref 0.0–0.7)
Eosinophils Relative: 2.6 % (ref 0.0–5.0)
HCT: 38.9 % (ref 36.0–46.0)
Hemoglobin: 12.4 g/dL (ref 12.0–15.0)
Lymphocytes Relative: 44.3 % (ref 12.0–46.0)
Lymphs Abs: 2.3 10*3/uL (ref 0.7–4.0)
MCHC: 31.7 g/dL (ref 30.0–36.0)
MCV: 88.2 fl (ref 78.0–100.0)
Monocytes Absolute: 0.5 10*3/uL (ref 0.1–1.0)
Monocytes Relative: 10.5 % (ref 3.0–12.0)
Neutro Abs: 2.2 10*3/uL (ref 1.4–7.7)
Neutrophils Relative %: 42.4 % — ABNORMAL LOW (ref 43.0–77.0)
Platelets: 451 10*3/uL — ABNORMAL HIGH (ref 150.0–400.0)
RBC: 4.41 Mil/uL (ref 3.87–5.11)
RDW: 15.6 % — ABNORMAL HIGH (ref 11.5–15.5)
WBC: 5.1 10*3/uL (ref 4.0–10.5)

## 2023-03-30 LAB — COMPREHENSIVE METABOLIC PANEL
ALT: 36 U/L — ABNORMAL HIGH (ref 0–35)
AST: 25 U/L (ref 0–37)
Albumin: 4.4 g/dL (ref 3.5–5.2)
Alkaline Phosphatase: 84 U/L (ref 39–117)
BUN: 11 mg/dL (ref 6–23)
CO2: 25 mEq/L (ref 19–32)
Calcium: 9.8 mg/dL (ref 8.4–10.5)
Chloride: 104 mEq/L (ref 96–112)
Creatinine, Ser: 0.62 mg/dL (ref 0.40–1.20)
GFR: 100.49 mL/min (ref >60.00)
Glucose, Bld: 118 mg/dL — ABNORMAL HIGH (ref 70–99)
Potassium: 3.9 mEq/L (ref 3.5–5.1)
Sodium: 141 mEq/L (ref 135–145)
Total Bilirubin: 0.4 mg/dL (ref 0.2–1.2)
Total Protein: 8 g/dL (ref 6.0–8.3)

## 2023-03-30 LAB — LIPID PANEL
Cholesterol: 101 mg/dL (ref 0–200)
HDL: 41.9 mg/dL (ref >39.00)
LDL Cholesterol: 35 mg/dL (ref 0–99)
NonHDL: 59.38
Total CHOL/HDL Ratio: 2
Triglycerides: 123 mg/dL (ref 0.0–149.0)
VLDL: 24.6 mg/dL (ref 0.0–40.0)

## 2023-03-30 LAB — HEMOGLOBIN A1C: Hgb A1c MFr Bld: 7 % — ABNORMAL HIGH (ref 4.6–6.5)

## 2023-03-30 LAB — VITAMIN D 25 HYDROXY (VIT D DEFICIENCY, FRACTURES): VITD: 59.38 ng/mL (ref 30.00–100.00)

## 2023-03-30 LAB — TSH: TSH: 3.41 u[IU]/mL (ref 0.35–5.50)

## 2023-03-30 LAB — VITAMIN B12: Vitamin B-12: >1501 pg/mL — ABNORMAL HIGH (ref 211–911)

## 2023-03-30 MED ORDER — CLOPIDOGREL BISULFATE 75 MG PO TABS: 75.0000 mg | ORAL_TABLET | Freq: Every day | ORAL | 1 refills | Status: DC

## 2023-03-30 MED ORDER — SCOPOLAMINE 1 MG/3DAYS TD PT72: 1.0000 | MEDICATED_PATCH | TRANSDERMAL | 12 refills | Status: AC

## 2023-03-30 NOTE — Addendum Note (Signed)
Addended by: Marian Sorrow D on: 03/30/2023 08:38 AM   Modules accepted: Orders

## 2023-03-30 NOTE — Progress Notes (Signed)
Established Patient Office Visit     CC/Reason for Visit: Annual preventive exam and subsequent Medicare wellness visit.  HPI: Rebecca Yoder is a 58 y.o. female who is coming in today for the above mentioned reasons. Past Medical History is significant for: Hypertension, hyperlipidemia, type 2 diabetes, asthma, GERD, history of right MCA CVA with hemiplegia.  She will be going on a cruise in September and is requesting scopolamine patches.  She is overdue for Tdap.  She had a colonoscopy in November 2021 but is a 3-year follow-up.  All other cancer screening is up-to-date.  She has routine eye and dental care, no perceived hearing difficulties.   Past Medical/Surgical History: Past Medical History:  Diagnosis Date   Anemia    ASTHMA 12/10/2009   Asthma    Cataract    CEREBROVASCULAR ACCIDENT, HX OF 12/10/2009   Complication of anesthesia    "just can't get me up" (06/09/2013)   Controlled type 2 diabetes mellitus without complication, without long-term current use of insulin (HCC)    no meds   Embolic stroke of right basal ganglia (HCC) 06/11/2015   with L residual weakness/hemiparesis    GERD (gastroesophageal reflux disease)    HYPERLIPIDEMIA 12/10/2009   HYPERTENSION 12/10/2009   PFO (patent foramen ovale)    Stroke (HCC) 2008   denies residual on 06/09/2013   Tendonitis of wrist, right     Past Surgical History:  Procedure Laterality Date   COLONOSCOPY     COSMETIC SURGERY     OVARIAN CYST REMOVAL  1990's   UPPER GASTROINTESTINAL ENDOSCOPY      Social History:  reports that she has never smoked. She has never used smokeless tobacco. She reports that she does not drink alcohol and does not use drugs.  Allergies: Allergies  Allergen Reactions   Bactrim [Sulfamethoxazole-Trimethoprim] Nausea Only    Nausea    Penicillins Rash    Family History:  Family History  Problem Relation Age of Onset   Multiple myeloma Mother    Breast cancer Mother    Diabetes  Mother    Diabetes Father    Breast cancer Maternal Aunt    Stomach cancer Maternal Aunt    Liver cancer Maternal Aunt    Prostate cancer Maternal Uncle    Colon cancer Neg Hx    Esophageal cancer Neg Hx    Rectal cancer Neg Hx      Current Outpatient Medications:    acetaminophen (TYLENOL) 325 MG tablet, Take 2 tablets (650 mg total) by mouth every 6 (six) hours as needed., Disp: 20 tablet, Rfl: 0   albuterol (VENTOLIN HFA) 108 (90 Base) MCG/ACT inhaler, INHALE 2 PUFFS BY MOUTH EVERY 6 HOURS AS NEEDED, Disp: 9 g, Rfl: 0   amLODipine (NORVASC) 10 MG tablet, Take 1 tablet by mouth once daily, Disp: 90 tablet, Rfl: 1   ARNUITY ELLIPTA 100 MCG/ACT AEPB, Inhale 1 puff by mouth once daily, Disp: 30 each, Rfl: 0   atorvastatin (LIPITOR) 40 MG tablet, TAKE 1 TABLET BY MOUTH AT BEDTIME, Disp: 90 tablet, Rfl: 0   carvedilol (COREG) 3.125 MG tablet, TAKE 1 TABLET BY MOUTH TWICE DAILY WITH A MEAL, Disp: 180 tablet, Rfl: 1   COMIRNATY syringe, , Disp: , Rfl:    diphenhydrAMINE (BENADRYL) 25 MG tablet, Take 50 mg by mouth at bedtime as needed. , Disp: , Rfl:    fluticasone (FLOVENT HFA) 110 MCG/ACT inhaler, INHALE 2 PUFFS IN THE MORNING AND AT BEDTIME, Disp:  12 g, Rfl: 0   hydrALAZINE (APRESOLINE) 10 MG tablet, Take by mouth., Disp: , Rfl:    levothyroxine (SYNTHROID) 25 MCG tablet, Take 1 tablet by mouth once daily, Disp: 90 tablet, Rfl: 1   losartan (COZAAR) 100 MG tablet, Take by mouth., Disp: , Rfl:    losartan (COZAAR) 50 MG tablet, Take 1 tablet (50 mg total) by mouth 2 (two) times daily., Disp: 180 tablet, Rfl: 1   meloxicam (MOBIC) 15 MG tablet, Take 1 tablet (15 mg total) by mouth daily., Disp: 30 tablet, Rfl: 0   Multiple Vitamin (MULTIVITAMIN WITH MINERALS) TABS tablet, Take 1 tablet by mouth daily., Disp: , Rfl:    pantoprazole (PROTONIX) 40 MG tablet, Take 1 tablet (40 mg total) by mouth daily as needed., Disp: 90 tablet, Rfl: 1   polyethylene glycol (MIRALAX / GLYCOLAX) packet, Take 17  g by mouth daily. (Patient taking differently: Take 17 g by mouth daily as needed.), Disp: 30 each, Rfl: 1   pravastatin (PRAVACHOL) 20 MG tablet, Take by mouth., Disp: , Rfl:    scopolamine (TRANSDERM-SCOP) 1 MG/3DAYS, Place 1 patch (1.5 mg total) onto the skin every 3 (three) days., Disp: 10 patch, Rfl: 12   clopidogrel (PLAVIX) 75 MG tablet, Take 1 tablet (75 mg total) by mouth daily., Disp: 90 tablet, Rfl: 1  Current Facility-Administered Medications:    IncobotulinumtoxinA SOLR 200 Units, 200 Units, Intramuscular, Once, Kirsteins, Victorino Sparrow, MD  Review of Systems:  Negative unless indicated in HPI.   Physical Exam: Vitals:   03/30/23 0805  BP: 120/78  Pulse: 73  Temp: 98.4 F (36.9 C)  TempSrc: Oral  SpO2: 98%  Weight: 194 lb 12.8 oz (88.4 kg)  Height: 5\' 5"  (1.651 m)    Body mass index is 32.42 kg/m.   Physical Exam Vitals reviewed.  Constitutional:      General: She is not in acute distress.    Appearance: Normal appearance. She is not ill-appearing, toxic-appearing or diaphoretic.  HENT:     Head: Normocephalic.     Right Ear: Tympanic membrane, ear canal and external ear normal. There is no impacted cerumen.     Left Ear: Tympanic membrane, ear canal and external ear normal. There is no impacted cerumen.     Nose: Nose normal.     Mouth/Throat:     Mouth: Mucous membranes are moist.     Pharynx: Oropharynx is clear. No oropharyngeal exudate or posterior oropharyngeal erythema.  Eyes:     General: No scleral icterus.       Right eye: No discharge.        Left eye: No discharge.     Conjunctiva/sclera: Conjunctivae normal.     Pupils: Pupils are equal, round, and reactive to light.  Neck:     Vascular: No carotid bruit.  Cardiovascular:     Rate and Rhythm: Normal rate and regular rhythm.     Pulses: Normal pulses.     Heart sounds: Normal heart sounds.  Pulmonary:     Effort: Pulmonary effort is normal. No respiratory distress.     Breath sounds: Normal  breath sounds.  Abdominal:     General: Abdomen is flat. Bowel sounds are normal.     Palpations: Abdomen is soft.  Musculoskeletal:        General: Normal range of motion.     Cervical back: Normal range of motion.  Skin:    General: Skin is warm and dry.  Neurological:  General: No focal deficit present.     Mental Status: She is alert and oriented to person, place, and time. Mental status is at baseline.  Psychiatric:        Mood and Affect: Mood normal.        Behavior: Behavior normal.        Thought Content: Thought content normal.        Judgment: Judgment normal.    Subsequent Medicare wellness visit   1. Risk factors, based on past  M,S,F -     2.  Physical activities: Dietary issues and exercise activities discussed:      3.  Depression/mood:  Flowsheet Row Office Visit from 03/30/2023 in [facility] Total Score 10        4.  ADL's:    03/30/2023    7:57 AM  In your present state of health, do you have any difficulty performing the following activities:  Hearing? 1  Vision? 0  Difficulty concentrating or making decisions? 0  Walking or climbing stairs? 1  Dressing or bathing? 0  Doing errands, shopping? 0  Preparing Food and eating ? N  Using the Toilet? N  In the past six months, have you accidently leaked urine? N  Do you have problems with loss of bowel control? N  Managing your Medications? N  Managing your Finances? N  Housekeeping or managing your Housekeeping? N     5.  Fall risk:     11/21/2022    1:06 PM 12/01/2022    1:18 PM 01/02/2023    1:17 PM 02/24/2023    1:18 PM 03/30/2023    8:15 AM  Fall Risk  Falls in the past year? 0 0 0 1 0  Was there an injury with Fall? 0 0 0 0 0  Fall Risk Category Calculator 0 0 0 2 0  Fall risk Follow up  Falls evaluation completed   Falls evaluation completed     6.  Home safety: No problems identified   7.  Height weight, and visual acuity: height and weight  as above, vision/hearing: Vision Screening   Right eye Left eye Both eyes  Without correction 20/30 20/30 20/30   With correction        8.  Counseling: Counseling given: Not Answered    9. Lab orders based on risk factors: Laboratory update will be reviewed   10. Cognitive assessment:        03/30/2023    8:00 AM  6CIT Screen  What Year? 0 points  What month? 0 points  What time? 0 points  Count back from 20 0 points  Months in reverse 0 points  Repeat phrase 0 points  Total Score 0 points     11. Screening: Patient provided with a written and personalized 5-10 year screening schedule in the AVS. Health Maintenance  Topic Date Due   Hepatitis C Screening  Never done   COVID-19 Vaccine (6 - 2023-24 season) 04/11/2022   Yearly kidney function blood test for diabetes  02/27/2023   Yearly kidney health urinalysis for diabetes  02/27/2023   Complete foot exam   02/27/2023   Flu Shot  03/12/2023   Colon Cancer Screening  07/04/2023   Hemoglobin A1C  06/02/2023   Eye exam for diabetics  08/23/2023   Medicare Annual Wellness Visit  03/29/2024   Mammogram  07/18/2024   Pap Smear  11/17/2024   DTaP/Tdap/Td vaccine (3 - Td or Tdap)  01/12/2033   HIV Screening  Completed   Zoster (Shingles) Vaccine  Completed   HPV Vaccine  Aged Out    12. Provider List Update: Patient Care Team    Relationship Specialty Notifications Start End  Philip Aspen, Limmie Patricia, MD PCP - General Internal Medicine  04/19/19   Micki Riley, MD Consulting Physician Neurology  08/21/15   Erick Colace, MD Consulting Physician Physical Medicine and Rehabilitation  08/21/15      13. Advance Directives: Does Patient Have a Medical Advance Directive?: Yes Type of Advance Directive: Living will, Healthcare Power of Attorney, Out of facility DNR (pink MOST or yellow form) Does patient want to make changes to medical advance directive?: No - Patient declined Copy of Healthcare Power of Attorney in  Chart?: No - copy requested  14. Opioids: Patient is not on any opioid prescriptions and has no risk factors for a substance use disorder.   15.   Goals      Activity and Exercise Increased     Evidence-based guidance:  Review current exercise levels.  Assess patient perspective on exercise or activity level, barriers to increasing activity, motivation and readiness for change.  Recommend or set healthy exercise goal based on individual tolerance.  Encourage small steps toward making change in amount of exercise or activity.  Urge reduction of sedentary activities or screen time.  Promote group activities within the community or with family or support person.  Consider referral to rehabiliation therapist for assessment and exercise/activity plan.   Notes:      Blood Pressure < 140/90         I have personally reviewed and noted the following in the patient's chart:   Medical and social history Use of alcohol, tobacco or illicit drugs  Current medications and supplements Functional ability and status Nutritional status Physical activity Advanced directives List of other physicians Hospitalizations, surgeries, and ER visits in previous 12 months Vitals Screenings to include cognitive, depression, and falls Referrals and appointments  In addition, I have reviewed and discussed with patient certain preventive protocols, quality metrics, and best practice recommendations. A written personalized care plan for preventive services as well as general preventive health recommendations were provided to patient.   Impression and Plan:  Medicare annual wellness visit, subsequent -     CBC with Differential/Platelet; Future -     Comprehensive metabolic panel; Future -     Lipid panel; Future -     TSH; Future -     Vitamin B12; Future -     VITAMIN D 25 Hydroxy (Vit-D Deficiency, Fractures); Future  Controlled type 2 diabetes mellitus with other neurologic complication, without  long-term current use of insulin (HCC) -     Vitamin B12; Future -     Hemoglobin A1c; Future -     Microalbumin / creatinine urine ratio; Future  Essential hypertension -     Comprehensive metabolic panel; Future  Pure hypercholesterolemia -     Lipid panel; Future  Acquired hypothyroidism -     TSH; Future  Controlled type 2 diabetes mellitus without complication, without long-term current use of insulin (HCC)  Right middle cerebral artery stroke (HCC) -     Clopidogrel Bisulfate; Take 1 tablet (75 mg total) by mouth daily.  Dispense: 90 tablet; Refill: 1  Sea sickness, initial encounter -     Scopolamine; Place 1 patch (1.5 mg total) onto the skin every 3 (three) days.  Dispense: 10 patch; Refill: 12   -  Recommend routine eye and dental care. -Healthy lifestyle discussed in detail. -Labs to be updated today. -Prostate cancer screening: N/A Health Maintenance  Topic Date Due   Hepatitis C Screening  Never done   COVID-19 Vaccine (6 - 2023-24 season) 04/11/2022   Yearly kidney function blood test for diabetes  02/27/2023   Yearly kidney health urinalysis for diabetes  02/27/2023   Complete foot exam   02/27/2023   Flu Shot  03/12/2023   Colon Cancer Screening  07/04/2023   Hemoglobin A1C  06/02/2023   Eye exam for diabetics  08/23/2023   Medicare Annual Wellness Visit  03/29/2024   Mammogram  07/18/2024   Pap Smear  11/17/2024   DTaP/Tdap/Td vaccine (3 - Td or Tdap) 01/12/2033   HIV Screening  Completed   Zoster (Shingles) Vaccine  Completed   HPV Vaccine  Aged Out    -Advised to update Tdap vaccine.     Chaya Jan, MD [facility] at [facility]

## 2023-03-31 LAB — HEPATITIS C ANTIBODY: Hepatitis C Ab: NONREACTIVE

## 2023-04-09 ENCOUNTER — Encounter: Payer: Self-pay | Admitting: Physical Medicine & Rehabilitation

## 2023-04-09 ENCOUNTER — Encounter: Payer: Medicare HMO | Attending: Physical Medicine & Rehabilitation | Admitting: Physical Medicine & Rehabilitation

## 2023-04-09 VITALS — BP 132/78 | HR 96 | Ht 65.0 in | Wt 196.0 lb

## 2023-04-09 DIAGNOSIS — G8110 Spastic hemiplegia affecting unspecified side: Secondary | ICD-10-CM | POA: Insufficient documentation

## 2023-04-09 DIAGNOSIS — R6889 Other general symptoms and signs: Secondary | ICD-10-CM | POA: Diagnosis not present

## 2023-04-09 NOTE — Progress Notes (Signed)
Subjective:    Patient ID: Rebecca Yoder, female    DOB: 03-15-1966, 57 y.o.   MRN: [id number]  HPI  Chronic left spastic hemi from hx of recurrent Right CVAs  Pt states botox was helpful for LE spasticity as well as upper extremity. Doing home exercises such as putting left hand into a container of rice .  No falls.  No new weakness   Botox Injection for spasticity using needle EMG guidance  Dilution: 50 Units/ml Indication: Severe spasticity which interferes with ADL,mobility and/or  hygiene and is unresponsive to medication management and other conservative care Informed consent was obtained after describing risks and benefits of the procedure with the patient. This includes bleeding, bruising, infection, excessive weakness, or medication side effects. A REMS form is on file and signed. Needle: 25g 2" needle electrode Number of units per muscle  LUE L Pronator teres 50U   LLE    FDL 75U FHL 50U Post tib 25U All injections were done after obtaining appropriate EMG activity and after negative drawback for blood. The patient tolerated the procedure well. Post procedure instructions were given. A followup appointment was made.   Pain Inventory Average Pain 0 Pain Right Now 0 My pain is sharp  In the last 24 hours, has pain interfered with the following? General activity 9 Relation with others 10 Enjoyment of life 8 What TIME of day is your pain at its worst? evening and night Sleep (in general) Good  Pain is worse with:  . Pain improves with: therapy/exercise Relief from Meds: 10  Family History  Problem Relation Age of Onset   Multiple myeloma Mother    Breast cancer Mother    Diabetes Mother    Diabetes Father    Breast cancer Maternal Aunt    Stomach cancer Maternal Aunt    Liver cancer Maternal Aunt    Prostate cancer Maternal Uncle    Colon cancer Neg Hx    Esophageal cancer Neg Hx    Rectal cancer Neg Hx    Social History   Socioeconomic  History   Marital status: Single    Spouse name: Not on file   Number of children: 0   Years of education: 12th   Highest education level: Not on file  Occupational History   Occupation: budd    Associate Professor: [employer]  Tobacco Use   Smoking status: Never   Smokeless tobacco: Never  Vaping Use   Vaping status: Never Used  Substance and Sexual Activity   Alcohol use: No    Alcohol/week: 0.0 standard drinks of alcohol   Drug use: No   Sexual activity: Yes  Other Topics Concern   Not on file  Social History Narrative   Not on file   Social Determinants of Health   Financial Resource Strain: Low Risk  (03/30/2023)   Overall Financial Resource Strain (CARDIA)    Difficulty of Paying Living Expenses: Not hard at all  Food Insecurity: No Food Insecurity (03/30/2023)   Hunger Vital Sign    Worried About Running Out of Food in the Last Year: Never true    Ran Out of Food in the Last Year: Never true  Transportation Needs: No Transportation Needs (03/30/2023)   PRAPARE - Administrator, Civil Service (Medical): No    Lack of Transportation (Non-Medical): No  Physical Activity: Sufficiently Active (03/30/2023)   Exercise Vital Sign    Days of Exercise per Week: 4 days    Minutes  of Exercise per Session: 40 min  Stress: No Stress Concern Present (03/30/2023)   [employer] of Occupational Health - Occupational Stress Questionnaire    Feeling of Stress : Not at all  Social Connections: Moderately Integrated (03/30/2023)   Social Connection and Isolation Panel [NHANES]    Frequency of Communication with Friends and Family: More than three times a week    Frequency of Social Gatherings with Friends and Family: More than three times a week    Attends Religious Services: 1 to 4 times per year    Active Member of [employer] or Organizations: Yes    Attends Engineer, structural: More than 4 times per year    Marital Status: Never married   Past Surgical History:   Procedure Laterality Date   COLONOSCOPY     COSMETIC SURGERY     OVARIAN CYST REMOVAL  1990's   UPPER GASTROINTESTINAL ENDOSCOPY     Past Surgical History:  Procedure Laterality Date   COLONOSCOPY     COSMETIC SURGERY     OVARIAN CYST REMOVAL  1990's   UPPER GASTROINTESTINAL ENDOSCOPY     Past Medical History:  Diagnosis Date   Anemia    ASTHMA 12/10/2009   Asthma    Cataract    CEREBROVASCULAR ACCIDENT, HX OF 12/10/2009   Complication of anesthesia    "just can't get me up" (06/09/2013)   Controlled type 2 diabetes mellitus without complication, without long-term current use of insulin (HCC)    no meds   Embolic stroke of right basal ganglia (HCC) 06/11/2015   with L residual weakness/hemiparesis    GERD (gastroesophageal reflux disease)    HYPERLIPIDEMIA 12/10/2009   HYPERTENSION 12/10/2009   PFO (patent foramen ovale)    Stroke (HCC) 2008   denies residual on 06/09/2013   Tendonitis of wrist, right    Ht 5\' 5"  (1.651 m)   Wt 196 lb (88.9 kg)   BMI 32.62 kg/m   Opioid Risk Score:   Fall Risk Score:  `1  Depression screen PHQ 2/9     03/30/2023    8:15 AM 02/24/2023    1:18 PM 01/02/2023    1:17 PM 12/01/2022    1:18 PM 11/21/2022    1:06 PM 10/03/2022    1:44 PM 09/01/2022   11:32 AM  Depression screen PHQ 2/9  Decreased Interest 3 0 0 3 0 0 0  Down, Depressed, Hopeless 0 0 0 0 0 0 0  PHQ - 2 Score 3 0 0 3 0 0 0  Altered sleeping 2   0   0  Tired, decreased energy 2   0   0  Change in appetite 3   0   0  Feeling bad or failure about yourself  0   0   0  Trouble concentrating 0   1   0  Moving slowly or fidgety/restless 0   0   0  Suicidal thoughts 0   0   0  PHQ-9 Score 10   4   0  Difficult doing work/chores Not difficult at all   Not difficult at all   Not difficult at all      Review of Systems  Neurological:  Positive for weakness.  All other systems reviewed and are negative.     Objective:   Physical Exam        Assessment & Plan:    Left  spastic hemiplegia chronic only partially responsive to stretching , HEP  following extensive OP as well as IP rehab programs Cont current dose of botox with same muscle group selection

## 2023-04-17 ENCOUNTER — Other Ambulatory Visit: Payer: Self-pay | Admitting: Internal Medicine

## 2023-04-20 ENCOUNTER — Telehealth: Payer: Self-pay

## 2023-04-20 ENCOUNTER — Ambulatory Visit: Payer: Medicare HMO

## 2023-04-20 ENCOUNTER — Ambulatory Visit
Admission: EM | Admit: 2023-04-20 | Discharge: 2023-04-20 | Disposition: A | Discharge status: Home / Self Care | Payer: Medicare HMO | Attending: Physician Assistant | Admitting: Physician Assistant

## 2023-04-20 DIAGNOSIS — M25511 Pain in right shoulder: Secondary | ICD-10-CM

## 2023-04-20 MED ORDER — KETOROLAC TROMETHAMINE 30 MG/ML IJ SOLN
15.0000 mg | Freq: Once | INTRAMUSCULAR | Status: AC
  Administered 2023-04-20: 15 mg via INTRAMUSCULAR

## 2023-04-20 NOTE — ED Triage Notes (Signed)
"  I fell down, I tripped on my cane, I fell onto my right shoulder". No head injury. No cuts/lacerations "some abrasions on right hand, fingers on right side". DOI "04-20-2023, around 230/3pm".

## 2023-04-20 NOTE — ED Notes (Signed)
15 mg given in Right Deltoid (per patient). Kept in room 5-15 mins post inj. B. Roten CMA

## 2023-04-20 NOTE — Telephone Encounter (Signed)
Attempted to contact patient re: Xray (from UC visit today).   Will call again and notify sister (preferred) of the following:  IMPRESSION: 1. No fracture or dislocation of the right shoulder. 2. High riding position of the right humeral head, suggesting rotator cuff tear, of uncertain chronicity. Correlate with exam; MRI may be used to further assess if indicated by clinical concern.  *Patient to keep PCP appt for tomorrow morning and seek their advise and/or referral to Orthopaedics if advised.  B. Roten CMA

## 2023-04-21 ENCOUNTER — Encounter: Payer: Self-pay | Admitting: Family Medicine

## 2023-04-21 ENCOUNTER — Ambulatory Visit (INDEPENDENT_AMBULATORY_CARE_PROVIDER_SITE_OTHER): Payer: Medicare HMO | Admitting: Family Medicine

## 2023-04-21 ENCOUNTER — Ambulatory Visit: Payer: Medicare HMO | Admitting: Family Medicine

## 2023-04-21 VITALS — BP 140/70 | HR 75 | Temp 98.0°F | Ht 65.5 in | Wt 190.4 lb

## 2023-04-21 DIAGNOSIS — R936 Abnormal findings on diagnostic imaging of limbs: Secondary | ICD-10-CM | POA: Diagnosis not present

## 2023-04-21 DIAGNOSIS — M25511 Pain in right shoulder: Secondary | ICD-10-CM | POA: Diagnosis not present

## 2023-04-21 MED ORDER — TRAMADOL HCL 50 MG PO TABS
50.0000 mg | ORAL_TABLET | Freq: Four times a day (QID) | ORAL | 0 refills | Status: DC | PRN
Start: 2023-04-21 — End: 2023-04-27

## 2023-04-21 NOTE — Patient Instructions (Signed)
We will get MRI of shoulder to rule out tear.

## 2023-04-21 NOTE — Progress Notes (Signed)
Established Patient Office Visit  Subjective   Patient ID: Shawntrell Fahie, female    DOB: December 06, 1965  Age: 57 y.o. MRN: [id number]  Chief Complaint  Patient presents with   Shoulder Pain    Patient complains of right shoulder pain, x1 day, Tried Tylenol    HPI   Ms. Maclaren is seen today with acute right shoulder pain.  She states yesterday she tripped over her cane and fell and landed on her right shoulder.  No loss of consciousness.  No neck injury.  No other injuries reported.  She has had multiple prior strokes and has limited use of her left upper extremity.  She is also had history of frozen shoulder on her left side as well in the past.  She is having a fair amount of pain.  Went to urgent care yesterday and had plain x-rays done which showed no fracture or dislocation.  There was comment of "high riding position of the right humeral head, suggesting rotator cuff tear, of uncertain chronicity "  She is especially concerned about her right shoulder because of her chronic weakness with the left side.  She did receive ketorolac 15 mg yesterday which helped temporarily.  She is having significant pain today not relieved with Tylenol.  She has multiple chronic problems including history of cerebrovascular disease, hyperlipidemia, hypertension, type 2 diabetes  Past Medical History:  Diagnosis Date   Anemia    ASTHMA 12/10/2009   Asthma    Cataract    CEREBROVASCULAR ACCIDENT, HX OF 12/10/2009   Complication of anesthesia    "just can't get me up" (06/09/2013)   Controlled type 2 diabetes mellitus without complication, without long-term current use of insulin (HCC)    no meds   Embolic stroke of right basal ganglia (HCC) 06/11/2015   with L residual weakness/hemiparesis    GERD (gastroesophageal reflux disease)    HYPERLIPIDEMIA 12/10/2009   HYPERTENSION 12/10/2009   PFO (patent foramen ovale)    Stroke (HCC) 2008   denies residual on 06/09/2013   Tendonitis of wrist, right     Past Surgical History:  Procedure Laterality Date   COLONOSCOPY     COSMETIC SURGERY     OVARIAN CYST REMOVAL  1990's   UPPER GASTROINTESTINAL ENDOSCOPY      reports that she has never smoked. She has never used smokeless tobacco. She reports that she does not drink alcohol and does not use drugs. family history includes Breast cancer in her maternal aunt and mother; Diabetes in her father and mother; Liver cancer in her maternal aunt; Multiple myeloma in her mother; Prostate cancer in her maternal uncle; Stomach cancer in her maternal aunt. Allergies  Allergen Reactions   Bactrim [Sulfamethoxazole-Trimethoprim] Nausea Only    Nausea    Penicillins Rash    Review of Systems  Cardiovascular:  Negative for chest pain.  Musculoskeletal:  Negative for neck pain.      Objective:     BP (!) 140/70 (BP Location: Left Arm, Patient Position: Sitting, Cuff Size: Normal)   Pulse 75   Temp 98 F (36.7 C) (Oral)   Ht 5' 5.5" (1.664 m)   Wt 190 lb 6.4 oz (86.4 kg)   SpO2 98%   BMI 31.20 kg/m    Physical Exam Vitals reviewed.  Constitutional:      Appearance: Normal appearance.  Cardiovascular:     Rate and Rhythm: Normal rate and regular rhythm.  Musculoskeletal:     Comments: She has limited range  of motion of her left shoulder at baseline.  Right shoulder reveals some diffuse poorly localized tenderness.  She has minimal pain with internal rotation.  Pain mostly with abduction against resistance.  Does have some significant weakness with external rotation  Neurological:     Mental Status: She is alert.      No results found for any visits on 04/21/23.    The ASCVD Risk score (Arnett DK, et al., 2019) failed to calculate for the following reasons:   The patient has a prior MI or stroke diagnosis    Assessment & Plan:   Problem List Items Addressed This Visit   None Visit Diagnoses     Acute pain of right shoulder    -  Primary   Relevant Orders   MR Shoulder  Right Wo Contrast     Patient presents with recent fall.  Plain x-ray showed no acute fracture.  Clinical suspicion for rotator cuff tear.  She already has limitations with chronic left upper extremity weakness secondary to stroke.  Set up MRI to further assess.  If MRI confirms rotator cuff tear set up orthopedic referral  -We wrote for limited tramadol 50 mg 1 every 6 hours as needed #20 with no refill.  May supplement with Tylenol -If MRI does not confirm rotator cuff tear consider sports medicine referral.  Stressed importance of early mobility to avoid frozen shoulder on the right  No follow-ups on file.    Evelena Peat, MD

## 2023-04-22 ENCOUNTER — Other Ambulatory Visit: Payer: Self-pay | Admitting: Internal Medicine

## 2023-04-22 DIAGNOSIS — E039 Hypothyroidism, unspecified: Secondary | ICD-10-CM

## 2023-04-24 ENCOUNTER — Encounter: Payer: Self-pay | Admitting: Physician Assistant

## 2023-04-24 ENCOUNTER — Telehealth: Payer: Self-pay | Admitting: Internal Medicine

## 2023-04-24 NOTE — ED Provider Notes (Signed)
[facility]    CSN: [id number] Arrival date & time: 04/20/23  1522      History   Chief Complaint Chief Complaint  Patient presents with   Fall    HPI Rebecca Yoder is a 57 y.o. female.   Patient here today for evaluation of right shoulder pain after she accidentally fell earlier today.  She states that she tripped over her cane and when she fell fell onto her right side.  She denies hitting her head or having any loss of consciousness.  She states she did fall onto her right hand and fingers but has not had any significant right hand or wrist pain and does not report hip pain.  The history is provided by the patient.  Fall Pertinent negatives include no abdominal pain.    Past Medical History:  Diagnosis Date   Anemia    ASTHMA 12/10/2009   Asthma    Cataract    CEREBROVASCULAR ACCIDENT, HX OF 12/10/2009   Complication of anesthesia    "just can't get me up" (06/09/2013)   Controlled type 2 diabetes mellitus without complication, without long-term current use of insulin (HCC)    no meds   Embolic stroke of right basal ganglia (HCC) 06/11/2015   with L residual weakness/hemiparesis    GERD (gastroesophageal reflux disease)    HYPERLIPIDEMIA 12/10/2009   HYPERTENSION 12/10/2009   PFO (patent foramen ovale)    Stroke (HCC) 2008   denies residual on 06/09/2013   Tendonitis of wrist, right     Patient Active Problem List   Diagnosis Date Noted   Hypothyroidism 02/20/2021   GERD (gastroesophageal reflux disease) 03/18/2016   Spastic hemiplegia affecting nondominant side (HCC) 11/02/2015   Gait disturbance, post-stroke 08/14/2015   Frozen shoulder syndrome 08/14/2015   Subacromial impingement of left shoulder 08/14/2015   Left shoulder pain 08/10/2015   Diabetes type 2, controlled (HCC) 07/31/2015   Eczema 07/31/2015   Right middle cerebral artery stroke (HCC) 06/14/2015   Left hemiparesis (HCC)    Left-sided neglect    Embolic stroke of right basal  ganglia (HCC) 06/11/2015   TIA (transient ischemic attack) 06/09/2013   PFO (patent foramen ovale) 06/09/2013   Hyperlipidemia 12/10/2009   Essential hypertension 12/10/2009   Mild persistent asthma 12/10/2009   History of cardiovascular disorder 12/10/2009    Past Surgical History:  Procedure Laterality Date   COLONOSCOPY     COSMETIC SURGERY     OVARIAN CYST REMOVAL  1990's   UPPER GASTROINTESTINAL ENDOSCOPY      OB History   No obstetric history on file.      Home Medications    Prior to Admission medications   Medication Sig Start Date End Date Taking? Authorizing Provider  clopidogrel (PLAVIX) 75 MG tablet Take 1 tablet (75 mg total) by mouth daily. 03/30/23  Yes Philip Aspen, Limmie Patricia, MD  acetaminophen (TYLENOL) 325 MG tablet Take 2 tablets (650 mg total) by mouth every 6 (six) hours as needed. 06/10/13   Regalado, Belkys A, MD  albuterol (VENTOLIN HFA) 108 (90 Base) MCG/ACT inhaler INHALE 2 PUFFS BY MOUTH EVERY 6 HOURS AS NEEDED 12/31/22   Philip Aspen, Limmie Patricia, MD  amLODipine (NORVASC) 10 MG tablet Take 1 tablet by mouth once daily 01/26/23   Philip Aspen, Limmie Patricia, MD  atorvastatin (LIPITOR) 40 MG tablet TAKE 1 TABLET BY MOUTH AT BEDTIME 03/17/23   Philip Aspen, Limmie Patricia, MD  carvedilol (COREG) 3.125 MG tablet TAKE 1 TABLET BY  MOUTH TWICE DAILY WITH A MEAL 12/29/22   Philip Aspen, Limmie Patricia, MD  COMIRNATY syringe  10/13/22   [provider]  diphenhydrAMINE (BENADRYL) 25 MG tablet Take 50 mg by mouth at bedtime as needed.     [provider]  fluticasone (FLOVENT HFA) 110 MCG/ACT inhaler INHALE 2 PUFFS IN THE MORNING AND AT BEDTIME 03/12/22   Philip Aspen, Limmie Patricia, MD  Fluticasone Furoate (ARNUITY ELLIPTA) 100 MCG/ACT AEPB Inhale 1 puff by mouth once daily 04/20/23   Philip Aspen, Limmie Patricia, MD  hydrALAZINE (APRESOLINE) 10 MG tablet Take by mouth. 03/18/16   [provider]  levothyroxine (SYNTHROID) 25 MCG tablet Take 1 tablet  by mouth once daily 04/22/23   Philip Aspen, Limmie Patricia, MD  losartan (COZAAR) 100 MG tablet Take by mouth. 03/18/16   [provider]  losartan (COZAAR) 50 MG tablet Take 1 tablet (50 mg total) by mouth 2 (two) times daily. 09/01/22   Philip Aspen, Limmie Patricia, MD  meloxicam (MOBIC) 15 MG tablet Take 1 tablet (15 mg total) by mouth daily. 12/04/22   Richardean Sale, DO  Multiple Vitamin (MULTIVITAMIN WITH MINERALS) TABS tablet Take 1 tablet by mouth daily.    [provider]  pantoprazole (PROTONIX) 40 MG tablet Take 1 tablet (40 mg total) by mouth daily as needed. 02/26/22   Philip Aspen, Limmie Patricia, MD  polyethylene glycol [facility] / Ethelene Hal) packet Take 17 g by mouth daily. Patient taking differently: Take 17 g by mouth daily as needed. 10/20/18   Hoy Register, MD  pravastatin (PRAVACHOL) 20 MG tablet Take by mouth. 03/18/16   [provider]  scopolamine (TRANSDERM-SCOP) 1 MG/3DAYS Place 1 patch (1.5 mg total) onto the skin every 3 (three) days. 03/30/23   Philip Aspen, Limmie Patricia, MD  traMADol (ULTRAM) 50 MG tablet Take 1 tablet (50 mg total) by mouth every 6 (six) hours as needed for up to 5 days. 04/21/23 04/26/23  Burchette, Elberta Fortis, MD    Family History Family History  Problem Relation Age of Onset   Multiple myeloma Mother    Breast cancer Mother    Diabetes Mother    Diabetes Father    Breast cancer Maternal Aunt    Stomach cancer Maternal Aunt    Liver cancer Maternal Aunt    Prostate cancer Maternal Uncle    Colon cancer Neg Hx    Esophageal cancer Neg Hx    Rectal cancer Neg Hx     Social History Social History   Tobacco Use   Smoking status: Never   Smokeless tobacco: Never  Vaping Use   Vaping status: Never Used  Substance Use Topics   Alcohol use: No    Alcohol/week: 0.0 standard drinks of alcohol   Drug use: No     Allergies   Bactrim [sulfamethoxazole-trimethoprim] and Penicillins   Review of Systems Review of Systems   Constitutional:  Negative for chills and fever.  Eyes:  Negative for discharge and redness.  Gastrointestinal:  Negative for abdominal pain, nausea and vomiting.  Musculoskeletal:  Positive for arthralgias.  Skin:  Negative for color change and wound.  Neurological:  Negative for numbness.     Physical Exam Triage Vital Signs ED Triage Vitals  Encounter Vitals Group     BP 04/20/23 1550 (!) 154/95     Systolic BP Percentile --      Diastolic BP Percentile --      Pulse Rate 04/20/23 1550 84  Resp 04/20/23 1550 20     Temp 04/20/23 1550 98.2 F (36.8 C)     Temp Source 04/20/23 1550 Oral     SpO2 04/20/23 1550 95 %     Weight 04/20/23 1548 196 lb (88.9 kg)     Height 04/20/23 1548 5' 5.5" (1.664 m)     Head Circumference --      Peak Flow --      Pain Score 04/20/23 1545 9     Pain Loc --      Pain Education --      Exclude from Growth Chart --    No data found.  Updated Vital Signs BP (!) 159/89 (BP Location: Left Arm)   Pulse 84   Temp 98.2 F (36.8 C) (Oral)   Resp 20   Ht 5' 5.5" (1.664 m)   Wt 196 lb (88.9 kg)   SpO2 95%   BMI 32.12 kg/m      Physical Exam Vitals and nursing note reviewed.  Constitutional:      General: She is not in acute distress.    Appearance: Normal appearance. She is not ill-appearing.  HENT:     Head: Normocephalic and atraumatic.  Eyes:     Conjunctiva/sclera: Conjunctivae normal.  Cardiovascular:     Rate and Rhythm: Normal rate.  Pulmonary:     Effort: Pulmonary effort is normal. No respiratory distress.  Musculoskeletal:     Comments: Mildly decreased range of motion of right shoulder due to pain.  No significant tenderness to palpation noted.  Normal range of motion of right elbow, right wrist and fingers.  Neurological:     Mental Status: She is alert.     Comments: Grip strength 5 out of 5 bilaterally.   Psychiatric:        Mood and Affect: Mood normal.        Behavior: Behavior normal.        Thought Content:  Thought content normal.      UC Treatments / Results  Labs (all labs ordered are listed, but only abnormal results are displayed) Labs Reviewed - No data to display  EKG   Radiology No results found.  Procedures Procedures (including critical care time)  Medications Ordered in UC Medications  ketorolac (TORADOL) 30 MG/ML injection 15 mg (15 mg Intramuscular Given 04/20/23 1817)    Initial Impression / Assessment and Plan / UC Course  I have reviewed the triage vital signs and the nursing notes.  Pertinent labs & imaging results that were available during my care of the patient were reviewed by me and considered in my medical decision making (see chart for details).   No fracture noted on x-ray but there is some concern for possible rotator cuff tear.  Toradol injection administered office at very low dose given age but patient requested for pain.  Encouraged follow-up with Ortho.   Final Clinical Impressions(s) / UC Diagnoses   Final diagnoses:  Acute pain of right shoulder   Discharge Instructions   None    ED Prescriptions   None    PDMP not reviewed this encounter.   Tomi Bamberger, PA-C 04/24/23 1221

## 2023-04-24 NOTE — Telephone Encounter (Signed)
Pt called to say she was told she cannot have her MRI until 05/08/23. Pt states she will be out of this pain medication way before that, and  would like a refill to treat pain, just until she has the MRI.

## 2023-04-24 NOTE — Telephone Encounter (Signed)
Pt seen by Burchette on 04/21/23, says still has shoulder pain, requesting another refill of traMADol (ULTRAM) 50 MG tablet

## 2023-04-27 MED ORDER — TRAMADOL HCL 50 MG PO TABS: 50.0000 mg | ORAL_TABLET | Freq: Four times a day (QID) | ORAL | 0 refills | Status: DC | PRN

## 2023-04-27 NOTE — Telephone Encounter (Signed)
Noted  

## 2023-04-27 NOTE — Addendum Note (Signed)
Addended by: Kristian Covey on: 04/27/2023 07:59 AM   Modules accepted: Orders

## 2023-04-27 NOTE — Telephone Encounter (Signed)
Tramadol refilled once.  Kristian Covey MD [facility]

## 2023-05-06 ENCOUNTER — Encounter: Payer: Self-pay | Admitting: Family Medicine

## 2023-05-08 ENCOUNTER — Ambulatory Visit
Admission: RE | Admit: 2023-05-08 | Discharge: 2023-05-08 | Disposition: A | Discharge status: Home / Self Care | Payer: Medicare HMO | Source: Ambulatory Visit | Attending: Family Medicine | Admitting: Family Medicine

## 2023-05-08 DIAGNOSIS — M7581 Other shoulder lesions, right shoulder: Secondary | ICD-10-CM | POA: Diagnosis not present

## 2023-05-08 DIAGNOSIS — M25511 Pain in right shoulder: Secondary | ICD-10-CM | POA: Diagnosis not present

## 2023-05-11 ENCOUNTER — Telehealth: Payer: Self-pay | Admitting: Internal Medicine

## 2023-05-11 NOTE — Telephone Encounter (Signed)
Pt is calling and would like MRI of shoulder results. Pt just had MRI on 05-08-2023

## 2023-05-12 NOTE — Telephone Encounter (Signed)
Results are not back yet as of Tuesday 10/1

## 2023-05-12 NOTE — Telephone Encounter (Signed)
Patient informed of the message below and voiced understanding  

## 2023-05-13 ENCOUNTER — Other Ambulatory Visit: Payer: Self-pay | Admitting: Family Medicine

## 2023-05-14 NOTE — Telephone Encounter (Signed)
Spoke with the patient and informed her the results are not available due to delay in the radiologist reading films at this time.  Patient is aware she will be contacted once results are received and reviewed by Dr Caryl Never.  Patient was also advised Dr Clent Ridges approved a refill for Tramadol and this was sent to the pharmacy today.

## 2023-05-14 NOTE — Telephone Encounter (Signed)
Pt called to ask if her results came back yet, and if so, she would like a call back to review.

## 2023-05-17 NOTE — Addendum Note (Signed)
Addended by: Kristian Covey on: 05/17/2023 02:39 PM   Modules accepted: Orders

## 2023-05-19 ENCOUNTER — Telehealth: Payer: Self-pay | Admitting: Internal Medicine

## 2023-05-19 NOTE — Telephone Encounter (Signed)
Pt called to say she is still waiting for someone to call her back to go over her results.  Please call Pt back at your earliest convenience.

## 2023-05-19 NOTE — Telephone Encounter (Signed)
ERROR PLEASE DISREGARD

## 2023-05-20 NOTE — Telephone Encounter (Signed)
See results note. 

## 2023-05-28 ENCOUNTER — Other Ambulatory Visit: Payer: Self-pay | Admitting: Internal Medicine

## 2023-05-28 ENCOUNTER — Encounter: Payer: Medicare HMO | Attending: Physical Medicine & Rehabilitation | Admitting: Physical Medicine & Rehabilitation

## 2023-05-28 ENCOUNTER — Encounter: Payer: Self-pay | Admitting: Physical Medicine & Rehabilitation

## 2023-05-28 VITALS — BP 143/85 | HR 77 | Temp 97.9°F | Ht 65.5 in | Wt 187.0 lb

## 2023-05-28 DIAGNOSIS — G8110 Spastic hemiplegia affecting unspecified side: Secondary | ICD-10-CM | POA: Insufficient documentation

## 2023-05-28 DIAGNOSIS — R6889 Other general symptoms and signs: Secondary | ICD-10-CM | POA: Diagnosis not present

## 2023-05-28 DIAGNOSIS — I10 Essential (primary) hypertension: Secondary | ICD-10-CM

## 2023-05-28 MED ORDER — ONABOTULINUMTOXINA 100 UNITS IJ SOLR
200.0000 [IU] | Freq: Once | INTRAMUSCULAR | Status: AC
Start: 2023-05-28 — End: 2023-05-28
  Administered 2023-05-28: 200 [IU] via INTRAMUSCULAR

## 2023-05-28 NOTE — Progress Notes (Signed)
Botox Injection for spasticity using needle EMG guidance  Dilution: 50 Units/ml Indication: Severe spasticity which interferes with ADL,mobility and/or  hygiene and is unresponsive to medication management and other conservative care Informed consent was obtained after describing risks and benefits of the procedure with the patient. This includes bleeding, bruising, infection, excessive weakness, or medication side effects. A REMS form is on file and signed. Needle: 25g 2" needle electrode Number of units per muscle  LUE L Pronator teres 50U   LLE    FDL 75U FHL 50U Post tib 25U All injections were done after obtaining appropriate EMG activity and after negative drawback for blood. The patient tolerated the procedure well. Post procedure instructions were given. A followup appointment was made.

## 2023-06-09 ENCOUNTER — Other Ambulatory Visit: Payer: Self-pay | Admitting: Internal Medicine

## 2023-06-09 DIAGNOSIS — Z1231 Encounter for screening mammogram for malignant neoplasm of breast: Secondary | ICD-10-CM

## 2023-06-12 ENCOUNTER — Ambulatory Visit
Admission: RE | Admit: 2023-06-12 | Discharge: 2023-06-12 | Disposition: A | Discharge status: Home / Self Care | Payer: Medicare HMO | Source: Ambulatory Visit | Attending: Family Medicine | Admitting: Family Medicine

## 2023-06-12 DIAGNOSIS — M25511 Pain in right shoulder: Secondary | ICD-10-CM

## 2023-06-12 DIAGNOSIS — R936 Abnormal findings on diagnostic imaging of limbs: Secondary | ICD-10-CM

## 2023-06-12 MED ORDER — GADOPICLENOL 0.5 MMOL/ML IV SOLN
10.0000 mL | Freq: Once | INTRAVENOUS | Status: AC | PRN
  Administered 2023-06-12: 10 mL via INTRAVENOUS

## 2023-06-14 ENCOUNTER — Other Ambulatory Visit: Payer: Self-pay | Admitting: Internal Medicine

## 2023-06-14 DIAGNOSIS — E119 Type 2 diabetes mellitus without complications: Secondary | ICD-10-CM

## 2023-06-17 ENCOUNTER — Encounter: Payer: Self-pay | Admitting: Gastroenterology

## 2023-06-30 ENCOUNTER — Encounter: Payer: Self-pay | Admitting: Internal Medicine

## 2023-06-30 ENCOUNTER — Ambulatory Visit (INDEPENDENT_AMBULATORY_CARE_PROVIDER_SITE_OTHER): Payer: Medicare HMO | Admitting: Internal Medicine

## 2023-06-30 VITALS — BP 130/70 | HR 90 | Temp 97.6°F | Wt 190.2 lb

## 2023-06-30 DIAGNOSIS — I10 Essential (primary) hypertension: Secondary | ICD-10-CM | POA: Diagnosis not present

## 2023-06-30 DIAGNOSIS — J4530 Mild persistent asthma, uncomplicated: Secondary | ICD-10-CM | POA: Diagnosis not present

## 2023-06-30 DIAGNOSIS — Z23 Encounter for immunization: Secondary | ICD-10-CM | POA: Diagnosis not present

## 2023-06-30 DIAGNOSIS — E7800 Pure hypercholesterolemia, unspecified: Secondary | ICD-10-CM

## 2023-06-30 DIAGNOSIS — E1149 Type 2 diabetes mellitus with other diabetic neurological complication: Secondary | ICD-10-CM

## 2023-06-30 DIAGNOSIS — R6889 Other general symptoms and signs: Secondary | ICD-10-CM | POA: Diagnosis not present

## 2023-06-30 DIAGNOSIS — M25511 Pain in right shoulder: Secondary | ICD-10-CM | POA: Diagnosis not present

## 2023-06-30 LAB — MICROALBUMIN / CREATININE URINE RATIO
Creatinine,U: 92.5 mg/dL
Microalb Creat Ratio: 2.9 mg/g (ref 0.0–30.0)
Microalb, Ur: 2.7 mg/dL — ABNORMAL HIGH (ref 0.0–1.9)

## 2023-06-30 LAB — POCT GLYCOSYLATED HEMOGLOBIN (HGB A1C): Hemoglobin A1C: 6.4 % — AB (ref 4.0–5.6)

## 2023-06-30 MED ORDER — AMLODIPINE BESYLATE 10 MG PO TABS
10.0000 mg | ORAL_TABLET | Freq: Every day | ORAL | 1 refills | Status: DC
Start: 2023-06-30 — End: 2023-12-29

## 2023-06-30 NOTE — Addendum Note (Signed)
Addended by: Kern Reap B on: 06/30/2023 04:40 PM   Modules accepted: Orders

## 2023-06-30 NOTE — Progress Notes (Signed)
Established Patient Office Visit     CC/Reason for Visit: Follow-up chronic conditions  HPI: Rebecca Yoder is a 57 y.o. female who is coming in today for the above mentioned reasons. Past Medical History is significant for: Hypertension, hyperlipidemia, type 2 diabetes, asthma, GERD, history of right MCA CVA with left-sided hemiparesis.  She fell in October and injured her right shoulder.  She had an MRI with out contrast that recommended a follow-up with contrast due to some findings.  That MRI was done in November 1 and is still not been read.  In the interim, shoulder has become less painful and range of motion has improved.   Past Medical/Surgical History: Past Medical History:  Diagnosis Date   Anemia    ASTHMA 12/10/2009   Asthma    Cataract    CEREBROVASCULAR ACCIDENT, HX OF 12/10/2009   Complication of anesthesia    "just can't get me up" (06/09/2013)   Controlled type 2 diabetes mellitus without complication, without long-term current use of insulin (HCC)    no meds   Embolic stroke of right basal ganglia (HCC) 06/11/2015   with L residual weakness/hemiparesis    GERD (gastroesophageal reflux disease)    HYPERLIPIDEMIA 12/10/2009   HYPERTENSION 12/10/2009   PFO (patent foramen ovale)    Stroke (HCC) 2008   denies residual on 06/09/2013   Tendonitis of wrist, right     Past Surgical History:  Procedure Laterality Date   COLONOSCOPY     COSMETIC SURGERY     OVARIAN CYST REMOVAL  1990's   UPPER GASTROINTESTINAL ENDOSCOPY      Social History:  reports that she has never smoked. She has never used smokeless tobacco. She reports that she does not drink alcohol and does not use drugs.  Allergies: Allergies  Allergen Reactions   Bactrim [Sulfamethoxazole-Trimethoprim] Nausea Only    Nausea    Penicillins Rash    Family History:  Family History  Problem Relation Age of Onset   Multiple myeloma Mother    Breast cancer Mother    Diabetes Mother     Diabetes Father    Breast cancer Maternal Aunt    Stomach cancer Maternal Aunt    Liver cancer Maternal Aunt    Prostate cancer Maternal Uncle    Colon cancer Neg Hx    Esophageal cancer Neg Hx    Rectal cancer Neg Hx      Current Outpatient Medications:    acetaminophen (TYLENOL) 325 MG tablet, Take 2 tablets (650 mg total) by mouth every 6 (six) hours as needed., Disp: 20 tablet, Rfl: 0   albuterol (VENTOLIN HFA) 108 (90 Base) MCG/ACT inhaler, INHALE 2 PUFFS BY MOUTH EVERY 6 HOURS AS NEEDED, Disp: 9 g, Rfl: 0   atorvastatin (LIPITOR) 40 MG tablet, Take 1 tablet (40 mg total) by mouth daily., Disp: 90 tablet, Rfl: 1   carvedilol (COREG) 3.125 MG tablet, TAKE 1 TABLET BY MOUTH TWICE DAILY WITH A MEAL, Disp: 180 tablet, Rfl: 1   clopidogrel (PLAVIX) 75 MG tablet, Take 1 tablet (75 mg total) by mouth daily., Disp: 90 tablet, Rfl: 1   COMIRNATY syringe, , Disp: , Rfl:    diphenhydrAMINE (BENADRYL) 25 MG tablet, Take 50 mg by mouth at bedtime as needed. , Disp: , Rfl:    fluticasone (FLOVENT HFA) 110 MCG/ACT inhaler, INHALE 2 PUFFS IN THE MORNING AND AT BEDTIME, Disp: 12 g, Rfl: 0   Fluticasone Furoate (ARNUITY ELLIPTA) 100 MCG/ACT AEPB, Inhale 1  puff by mouth once daily, Disp: 30 each, Rfl: 2   hydrALAZINE (APRESOLINE) 10 MG tablet, Take by mouth., Disp: , Rfl:    IVERMECTIN PO, Take by mouth., Disp: , Rfl:    levothyroxine (SYNTHROID) 25 MCG tablet, Take 1 tablet by mouth once daily, Disp: 90 tablet, Rfl: 1   losartan (COZAAR) 100 MG tablet, Take by mouth., Disp: , Rfl:    losartan (COZAAR) 50 MG tablet, Take 1 tablet by mouth twice daily, Disp: 180 tablet, Rfl: 0   meloxicam (MOBIC) 15 MG tablet, Take 1 tablet (15 mg total) by mouth daily., Disp: 30 tablet, Rfl: 0   Multiple Vitamin (MULTIVITAMIN WITH MINERALS) TABS tablet, Take 1 tablet by mouth daily., Disp: , Rfl:    pantoprazole (PROTONIX) 40 MG tablet, Take 1 tablet (40 mg total) by mouth daily as needed., Disp: 90 tablet, Rfl: 1    polyethylene glycol (MIRALAX / GLYCOLAX) packet, Take 17 g by mouth daily. (Patient taking differently: Take 17 g by mouth daily as needed.), Disp: 30 each, Rfl: 1   pravastatin (PRAVACHOL) 20 MG tablet, Take by mouth., Disp: , Rfl:    scopolamine (TRANSDERM-SCOP) 1 MG/3DAYS, Place 1 patch (1.5 mg total) onto the skin every 3 (three) days., Disp: 10 patch, Rfl: 12   traMADol (ULTRAM) 50 MG tablet, Take 1 tablet (50 mg total) by mouth every 6 (six) hours as needed., Disp: 20 tablet, Rfl: 0   amLODipine (NORVASC) 10 MG tablet, Take 1 tablet (10 mg total) by mouth daily., Disp: 90 tablet, Rfl: 1  Review of Systems:  Negative unless indicated in HPI.   Physical Exam: Vitals:   06/30/23 1319  BP: 130/70  Pulse: 90  Temp: 97.6 F (36.4 C)  TempSrc: Oral  SpO2: 98%  Weight: 190 lb 3.2 oz (86.3 kg)    Body mass index is 31.17 kg/m.   Physical Exam Vitals reviewed.  Constitutional:      Appearance: Normal appearance.  HENT:     Head: Normocephalic and atraumatic.  Eyes:     Conjunctiva/sclera: Conjunctivae normal.     Pupils: Pupils are equal, round, and reactive to light.  Cardiovascular:     Rate and Rhythm: Normal rate and regular rhythm.  Pulmonary:     Effort: Pulmonary effort is normal.     Breath sounds: Normal breath sounds.  Skin:    General: Skin is warm and dry.  Neurological:     Mental Status: She is alert and oriented to person, place, and time.  Psychiatric:        Mood and Affect: Mood normal.        Behavior: Behavior normal.        Thought Content: Thought content normal.        Judgment: Judgment normal.      Impression and Plan:  Controlled type 2 diabetes mellitus with other neurologic complication, without long-term current use of insulin (HCC) -     POCT glycosylated hemoglobin (Hb A1C) -     Microalbumin / creatinine urine ratio; Future  Essential hypertension -     amLODIPine Besylate; Take 1 tablet (10 mg total) by mouth daily.  Dispense: 90  tablet; Refill: 1  Pure hypercholesterolemia  Mild persistent asthma without complication  Acute pain of right shoulder  Immunization due   -Blood pressure is well-controlled on current.  Refill amlodipine. -Flu vaccine administered today. -A1c of 6.4 demonstrates excellent glycemic management. -She has been having right shoulder pain ever since a fall.  Most recent MRI reading from November 1 is pending.  I will have it escalated to be read soon.  Time spent:32 minutes reviewing chart, interviewing and examining patient and formulating plan of care.     Chaya Jan, MD [facility]

## 2023-07-14 ENCOUNTER — Encounter: Payer: Self-pay | Admitting: Physical Medicine & Rehabilitation

## 2023-07-14 ENCOUNTER — Encounter: Payer: Medicare HMO | Attending: Physical Medicine & Rehabilitation | Admitting: Physical Medicine & Rehabilitation

## 2023-07-14 VITALS — BP 131/80 | HR 78 | Ht 66.0 in | Wt 192.0 lb

## 2023-07-14 DIAGNOSIS — G8110 Spastic hemiplegia affecting unspecified side: Secondary | ICD-10-CM | POA: Diagnosis present

## 2023-07-14 NOTE — Progress Notes (Signed)
Subjective:    Patient ID: Rebecca Yoder, female    DOB: [birth year], 57 y.o.   MRN: [id number]  HPI CC: Left sided weakness and spasticity 57 yo female with hx of DM and PFO who has a history of recurrent strokes affecting RIght cerebral cortex and subcortical areas 10/22/06,06/09/13, 06/13/15 who returns for spasticity management   Last Botox 05/28/2023 LUE L Pronator teres 50U   LLE FDL 75U FHL 50U Post tib 25U  Practicing pronation with LUE   Folding clothes and practicing writing with left hand Pain Inventory Average Pain 3 Pain Right Now 0 My pain is sharp  In the last 24 hours, has pain interfered with the following? General activity 9 Relation with others 10 Enjoyment of life 10 What TIME of day is your pain at its worst? night Sleep (in general) Good  Pain is worse with:  . Pain improves with: therapy/exercise Relief from Meds: 10  Family History  Problem Relation Age of Onset   Multiple myeloma Mother    Breast cancer Mother    Diabetes Mother    Diabetes Father    Breast cancer Maternal Aunt    Stomach cancer Maternal Aunt    Liver cancer Maternal Aunt    Prostate cancer Maternal Uncle    Colon cancer Neg Hx    Esophageal cancer Neg Hx    Rectal cancer Neg Hx    Social History   Socioeconomic History   Marital status: Single    Spouse name: Not on file   Number of children: 0   Years of education: 12th   Highest education level: Not on file  Occupational History   Occupation: budd    Associate Professor: [employer]  Tobacco Use   Smoking status: Never   Smokeless tobacco: Never  Vaping Use   Vaping status: Never Used  Substance and Sexual Activity   Alcohol use: No    Alcohol/week: 0.0 standard drinks of alcohol   Drug use: No   Sexual activity: Yes  Other Topics Concern   Not on file  Social History Narrative   Not on file   Social Determinants of Health   Financial Resource Strain: Low Risk  (03/30/2023)   Overall Financial  Resource Strain (CARDIA)    Difficulty of Paying Living Expenses: Not hard at all  Food Insecurity: No Food Insecurity (03/30/2023)   Hunger Vital Sign    Worried About Running Out of Food in the Last Year: Never true    Ran Out of Food in the Last Year: Never true  Transportation Needs: No Transportation Needs (03/30/2023)   PRAPARE - Administrator, Civil Service (Medical): No    Lack of Transportation (Non-Medical): No  Physical Activity: Sufficiently Active (03/30/2023)   Exercise Vital Sign    Days of Exercise per Week: 4 days    Minutes of Exercise per Session: 40 min  Stress: No Stress Concern Present (03/30/2023)   [employer] of Occupational Health - Occupational Stress Questionnaire    Feeling of Stress : Not at all  Social Connections: Moderately Integrated (03/30/2023)   Social Connection and Isolation Panel [NHANES]    Frequency of Communication with Friends and Family: More than three times a week    Frequency of Social Gatherings with Friends and Family: More than three times a week    Attends Religious Services: 1 to 4 times per year    Active Member of Clubs or Organizations: Yes    Attends  Engineer, structural: More than 4 times per year    Marital Status: Never married   Past Surgical History:  Procedure Laterality Date   COLONOSCOPY     COSMETIC SURGERY     OVARIAN CYST REMOVAL  1990's   UPPER GASTROINTESTINAL ENDOSCOPY     Past Surgical History:  Procedure Laterality Date   COLONOSCOPY     COSMETIC SURGERY     OVARIAN CYST REMOVAL  1990's   UPPER GASTROINTESTINAL ENDOSCOPY     Past Medical History:  Diagnosis Date   Anemia    ASTHMA 12/10/2009   Asthma    Cataract    CEREBROVASCULAR ACCIDENT, HX OF 12/10/2009   Complication of anesthesia    "just can't get me up" (06/09/2013)   Controlled type 2 diabetes mellitus without complication, without long-term current use of insulin (HCC)    no meds   Embolic stroke of right basal  ganglia (HCC) 06/11/2015   with L residual weakness/hemiparesis    GERD (gastroesophageal reflux disease)    HYPERLIPIDEMIA 12/10/2009   HYPERTENSION 12/10/2009   PFO (patent foramen ovale)    Stroke (HCC) 2008   denies residual on 06/09/2013   Tendonitis of wrist, right    BP 131/80   Pulse 78   Ht 5\' 6"  (1.676 m)   Wt 192 lb (87.1 kg)   BMI 30.99 kg/m   Opioid Risk Score:   Fall Risk Score:  `1  Depression screen PHQ 2/9     06/30/2023    1:34 PM 04/21/2023    1:23 PM 03/30/2023    8:15 AM 02/24/2023    1:18 PM 01/02/2023    1:17 PM 12/01/2022    1:18 PM 11/21/2022    1:06 PM  Depression screen PHQ 2/9  Decreased Interest 0 0 3 0 0 3 0  Down, Depressed, Hopeless 0 0 0 0 0 0 0  PHQ - 2 Score 0 0 3 0 0 3 0  Altered sleeping  0 2   0   Tired, decreased energy  0 2   0   Change in appetite  0 3   0   Feeling bad or failure about yourself   0 0   0   Trouble concentrating  0 0   1   Moving slowly or fidgety/restless  0 0   0   Suicidal thoughts  0 0   0   PHQ-9 Score  0 10   4   Difficult doing work/chores  Not difficult at all Not difficult at all   Not difficult at all      Review of Systems     Objective:   Physical Exam  Tone  LUE pronator MAS 2 WF MAS 2 FF MAS 1/2  LLE  Foot supinators MAS 3 EHL without WB MAS 3 FHL with WB MAS 3        Assessment & Plan:    Left spastic hemiparesis improvements with Botox but has excess great toe flexor and foot invertor spasticity.   Will increase total dose to 300U  LUE L Pronator teres 50U   LLE FDL 75U FHL 75U an increase Post tib 75U an increase  EHL 25U added

## 2023-07-18 ENCOUNTER — Other Ambulatory Visit: Payer: Self-pay | Admitting: Internal Medicine

## 2023-07-18 DIAGNOSIS — I10 Essential (primary) hypertension: Secondary | ICD-10-CM

## 2023-07-20 ENCOUNTER — Ambulatory Visit
Admission: RE | Admit: 2023-07-20 | Discharge: 2023-07-20 | Disposition: A | Discharge status: Home / Self Care | Payer: Medicare HMO | Source: Ambulatory Visit | Attending: Internal Medicine | Admitting: Internal Medicine

## 2023-07-20 DIAGNOSIS — Z1231 Encounter for screening mammogram for malignant neoplasm of breast: Secondary | ICD-10-CM | POA: Diagnosis not present

## 2023-07-25 ENCOUNTER — Other Ambulatory Visit: Payer: Self-pay | Admitting: Internal Medicine

## 2023-07-31 ENCOUNTER — Encounter: Payer: Self-pay | Admitting: Internal Medicine

## 2023-07-31 DIAGNOSIS — Z0000 Encounter for general adult medical examination without abnormal findings: Secondary | ICD-10-CM

## 2023-08-24 ENCOUNTER — Other Ambulatory Visit: Payer: Self-pay | Admitting: Internal Medicine

## 2023-08-24 DIAGNOSIS — I10 Essential (primary) hypertension: Secondary | ICD-10-CM

## 2023-08-25 ENCOUNTER — Encounter: Payer: Self-pay | Admitting: Physical Medicine & Rehabilitation

## 2023-08-25 ENCOUNTER — Encounter: Payer: Medicare HMO | Attending: Physical Medicine & Rehabilitation | Admitting: Physical Medicine & Rehabilitation

## 2023-08-25 VITALS — BP 130/80 | HR 81 | Ht 66.0 in | Wt 197.0 lb

## 2023-08-25 DIAGNOSIS — G8110 Spastic hemiplegia affecting unspecified side: Secondary | ICD-10-CM | POA: Diagnosis not present

## 2023-08-25 MED ORDER — ONABOTULINUMTOXINA 100 UNITS IJ SOLR
300.0000 [IU] | Freq: Once | INTRAMUSCULAR | Status: AC
Start: 2023-08-25 — End: 2023-08-25
  Administered 2023-08-25: 300 [IU] via INTRAMUSCULAR

## 2023-08-25 NOTE — Patient Instructions (Signed)

## 2023-08-25 NOTE — Progress Notes (Signed)
 Botox  Injection for spasticity using needle EMG guidance  Dilution: 50 Units/ml Indication: Severe spasticity which interferes with ADL,mobility and/or  hygiene and is unresponsive to medication management and other conservative care Informed consent was obtained after describing risks and benefits of the procedure with the patient. This includes bleeding, bruising, infection, excessive weakness, or medication side effects. A REMS form is on file and signed. Needle: 25g 2 needle electrode Number of units per muscle   LUE L Pronator teres 50U   LLE FDL 75U FHL 75U an increase Post tib 75U an increase  EHL 25U added  All injections were done after obtaining appropriate EMG activity and after negative drawback for blood. The patient tolerated the procedure well. Post procedure instructions were given. A followup appointment was made.

## 2023-08-28 ENCOUNTER — Other Ambulatory Visit: Payer: Self-pay | Admitting: Internal Medicine

## 2023-09-28 ENCOUNTER — Other Ambulatory Visit: Payer: Self-pay | Admitting: Internal Medicine

## 2023-09-28 DIAGNOSIS — I10 Essential (primary) hypertension: Secondary | ICD-10-CM

## 2023-09-28 DIAGNOSIS — I63511 Cerebral infarction due to unspecified occlusion or stenosis of right middle cerebral artery: Secondary | ICD-10-CM

## 2023-09-28 DIAGNOSIS — E039 Hypothyroidism, unspecified: Secondary | ICD-10-CM

## 2023-09-30 ENCOUNTER — Ambulatory Visit: Payer: Medicare HMO | Admitting: Internal Medicine

## 2023-10-06 ENCOUNTER — Ambulatory Visit (INDEPENDENT_AMBULATORY_CARE_PROVIDER_SITE_OTHER): Payer: Medicare Other | Admitting: Internal Medicine

## 2023-10-06 ENCOUNTER — Encounter: Payer: Self-pay | Admitting: Internal Medicine

## 2023-10-06 VITALS — BP 120/80 | HR 75 | Temp 97.8°F | Wt 194.9 lb

## 2023-10-06 DIAGNOSIS — J4530 Mild persistent asthma, uncomplicated: Secondary | ICD-10-CM | POA: Diagnosis not present

## 2023-10-06 DIAGNOSIS — Z1211 Encounter for screening for malignant neoplasm of colon: Secondary | ICD-10-CM

## 2023-10-06 DIAGNOSIS — I10 Essential (primary) hypertension: Secondary | ICD-10-CM | POA: Diagnosis not present

## 2023-10-06 DIAGNOSIS — E1149 Type 2 diabetes mellitus with other diabetic neurological complication: Secondary | ICD-10-CM

## 2023-10-06 DIAGNOSIS — E7800 Pure hypercholesterolemia, unspecified: Secondary | ICD-10-CM

## 2023-10-06 DIAGNOSIS — G8194 Hemiplegia, unspecified affecting left nondominant side: Secondary | ICD-10-CM | POA: Diagnosis not present

## 2023-10-06 LAB — POCT GLYCOSYLATED HEMOGLOBIN (HGB A1C): Hemoglobin A1C: 6.5 % — AB (ref 4.0–5.6)

## 2023-10-06 NOTE — Progress Notes (Signed)
 Established Patient Office Visit     CC/Reason for Visit: Follow-up chronic medical conditions  HPI: Rebecca Yoder is a 58 y.o. female who is coming in today for the above mentioned reasons. Past Medical History is significant for: Hypertension, hyperlipidemia, type 2 diabetes, asthma, GERD, history of right MCA CVA with left-sided hemiparesis.  Feeling well without major concerns or complaints.   Past Medical/Surgical History: Past Medical History:  Diagnosis Date   Anemia    ASTHMA 12/10/2009   Asthma    Cataract    CEREBROVASCULAR ACCIDENT, HX OF 12/10/2009   Complication of anesthesia    "just can't get me up" (06/09/2013)   Controlled type 2 diabetes mellitus without complication, without long-term current use of insulin (HCC)    no meds   Embolic stroke of right basal ganglia (HCC) 06/11/2015   with L residual weakness/hemiparesis    GERD (gastroesophageal reflux disease)    HYPERLIPIDEMIA 12/10/2009   HYPERTENSION 12/10/2009   PFO (patent foramen ovale)    Stroke (HCC) 2008   denies residual on 06/09/2013   Tendonitis of wrist, right     Past Surgical History:  Procedure Laterality Date   COLONOSCOPY     COSMETIC SURGERY     OVARIAN CYST REMOVAL  1990's   UPPER GASTROINTESTINAL ENDOSCOPY      Social History:  reports that she has never smoked. She has never used smokeless tobacco. She reports that she does not drink alcohol and does not use drugs.  Allergies: Allergies  Allergen Reactions   Bactrim [Sulfamethoxazole-Trimethoprim] Nausea Only    Nausea    Penicillins Rash    Family History:  Family History  Problem Relation Age of Onset   Multiple myeloma Mother    Breast cancer Mother    Diabetes Mother    Diabetes Father    Breast cancer Maternal Aunt    Stomach cancer Maternal Aunt    Liver cancer Maternal Aunt    Prostate cancer Maternal Uncle    Colon cancer Neg Hx    Esophageal cancer Neg Hx    Rectal cancer Neg Hx      Current  Outpatient Medications:    acetaminophen (TYLENOL) 325 MG tablet, Take 2 tablets (650 mg total) by mouth every 6 (six) hours as needed., Disp: 20 tablet, Rfl: 0   albuterol (VENTOLIN HFA) 108 (90 Base) MCG/ACT inhaler, INHALE 2 PUFFS BY MOUTH EVERY 6 HOURS AS NEEDED, Disp: 9 g, Rfl: 0   amLODipine (NORVASC) 10 MG tablet, Take 1 tablet (10 mg total) by mouth daily., Disp: 90 tablet, Rfl: 1   atorvastatin (LIPITOR) 40 MG tablet, Take 1 tablet (40 mg total) by mouth daily., Disp: 90 tablet, Rfl: 1   carvedilol (COREG) 3.125 MG tablet, TAKE 1 TABLET BY MOUTH TWICE DAILY WITH A MEAL, Disp: 180 tablet, Rfl: 0   clopidogrel (PLAVIX) 75 MG tablet, Take 1 tablet by mouth once daily, Disp: 90 tablet, Rfl: 0   COMIRNATY syringe, , Disp: , Rfl:    diphenhydrAMINE (BENADRYL) 25 MG tablet, Take 50 mg by mouth at bedtime as needed. , Disp: , Rfl:    fluticasone (FLOVENT HFA) 110 MCG/ACT inhaler, INHALE 2 PUFFS IN THE MORNING AND AT BEDTIME, Disp: 12 g, Rfl: 0   Fluticasone Furoate (ARNUITY ELLIPTA) 100 MCG/ACT AEPB, Inhale 1 puff by mouth once daily, Disp: 30 each, Rfl: 2   hydrALAZINE (APRESOLINE) 10 MG tablet, Take by mouth., Disp: , Rfl:    IVERMECTIN PO, Take  by mouth., Disp: , Rfl:    levothyroxine (SYNTHROID) 25 MCG tablet, Take 1 tablet by mouth once daily, Disp: 90 tablet, Rfl: 0   losartan (COZAAR) 100 MG tablet, Take by mouth., Disp: , Rfl:    losartan (COZAAR) 50 MG tablet, Take 1 tablet by mouth twice daily, Disp: 180 tablet, Rfl: 0   meloxicam (MOBIC) 15 MG tablet, Take 1 tablet (15 mg total) by mouth daily., Disp: 30 tablet, Rfl: 0   Multiple Vitamin (MULTIVITAMIN WITH MINERALS) TABS tablet, Take 1 tablet by mouth daily., Disp: , Rfl:    pantoprazole (PROTONIX) 40 MG tablet, Take 1 tablet (40 mg total) by mouth daily as needed., Disp: 90 tablet, Rfl: 1   polyethylene glycol (MIRALAX / GLYCOLAX) packet, Take 17 g by mouth daily. (Patient taking differently: Take 17 g by mouth daily as needed.),  Disp: 30 each, Rfl: 1   pravastatin (PRAVACHOL) 20 MG tablet, Take by mouth., Disp: , Rfl:    scopolamine (TRANSDERM-SCOP) 1 MG/3DAYS, Place 1 patch (1.5 mg total) onto the skin every 3 (three) days., Disp: 10 patch, Rfl: 12   traMADol (ULTRAM) 50 MG tablet, Take 1 tablet (50 mg total) by mouth every 6 (six) hours as needed., Disp: 20 tablet, Rfl: 0  Review of Systems:  Negative unless indicated in HPI.   Physical Exam: Vitals:   10/06/23 1406  BP: 120/80  Pulse: 75  Temp: 97.8 F (36.6 C)  TempSrc: Oral  SpO2: 99%  Weight: 194 lb 14.4 oz (88.4 kg)    Body mass index is 31.46 kg/m.   Physical Exam Vitals reviewed.  Constitutional:      Appearance: Normal appearance.  HENT:     Head: Normocephalic and atraumatic.  Eyes:     Conjunctiva/sclera: Conjunctivae normal.  Cardiovascular:     Rate and Rhythm: Normal rate and regular rhythm.  Pulmonary:     Effort: Pulmonary effort is normal.     Breath sounds: Normal breath sounds.  Skin:    General: Skin is warm and dry.  Neurological:     Mental Status: She is alert and oriented to person, place, and time. Mental status is at baseline.  Psychiatric:        Mood and Affect: Mood normal.        Behavior: Behavior normal.        Thought Content: Thought content normal.        Judgment: Judgment normal.      Impression and Plan:  Controlled type 2 diabetes mellitus with other neurologic complication, without long-term current use of insulin (HCC) -     POCT glycosylated hemoglobin (Hb A1C)  Screening for malignant neoplasm of colon -     Ambulatory referral to Gastroenterology  Essential hypertension  Pure hypercholesterolemia  Mild persistent asthma without complication  Left hemiparesis (HCC)  -Blood pressure is well-controlled on current. -A1c demonstrates excellent control of diabetes at 6.5. -Refer to GI for screening colonoscopy. -Asthma is well-controlled.   Time spent:31 minutes reviewing chart,  interviewing and examining patient and formulating plan of care.     Chaya Jan, MD [facility]

## 2023-10-07 ENCOUNTER — Encounter: Payer: Self-pay | Admitting: Nurse Practitioner

## 2023-10-09 ENCOUNTER — Ambulatory Visit: Payer: Medicare HMO | Admitting: Physical Medicine & Rehabilitation

## 2023-10-13 ENCOUNTER — Encounter: Payer: Medicare Other | Attending: Physical Medicine & Rehabilitation | Admitting: Physical Medicine & Rehabilitation

## 2023-10-13 ENCOUNTER — Encounter: Payer: Self-pay | Admitting: Physical Medicine & Rehabilitation

## 2023-10-13 VITALS — BP 135/77 | HR 88 | Ht 66.0 in | Wt 198.4 lb

## 2023-10-13 DIAGNOSIS — G8110 Spastic hemiplegia affecting unspecified side: Secondary | ICD-10-CM | POA: Diagnosis not present

## 2023-10-13 NOTE — Progress Notes (Signed)
 Subjective:    Patient ID: Rebecca Yoder, female    DOB: October 24, 1965, 58 y.o.   MRN: [id number]  HPI  58 yo female with chronic left spastic hemiparesis due to remote right CVA.  Overall doing well modified independent level with all self-care and mobility wears a left AFO and uses a quad cane  Taking a chair yoga class 75-90 min sessions Enjoying the class and feels great relaxation  6 weeks ago underwent botulinum toxin injections of the following muscle groups L Pronator teres 50U   LLE FDL 75U FHL 75U an increase Post tib 75U an increase  EHL 25U added  Pain Inventory Average Pain 0 Pain Right Now 0 My pain is  no pain  In the last 24 hours, has pain interfered with the following? General activity 0 Relation with others 0 Enjoyment of life 0 What TIME of day is your pain at its worst? No pain Sleep (in general) Good  Pain is worse with:  no pain Pain improves with:  no pain Relief from Meds:  n/a  Family History  Problem Relation Age of Onset   Multiple myeloma Mother    Breast cancer Mother    Diabetes Mother    Diabetes Father    Breast cancer Maternal Aunt    Stomach cancer Maternal Aunt    Liver cancer Maternal Aunt    Prostate cancer Maternal Uncle    Colon cancer Neg Hx    Esophageal cancer Neg Hx    Rectal cancer Neg Hx    Social History   Socioeconomic History   Marital status: Single    Spouse name: Not on file   Number of children: 0   Years of education: 12th   Highest education level: Not on file  Occupational History   Occupation: budd    Associate Professor: [employer]  Tobacco Use   Smoking status: Never   Smokeless tobacco: Never  Vaping Use   Vaping status: Never Used  Substance and Sexual Activity   Alcohol use: No    Alcohol/week: 0.0 standard drinks of alcohol   Drug use: No   Sexual activity: Yes  Other Topics Concern   Not on file  Social History Narrative   Not on file   Social Drivers of Health   Financial  Resource Strain: Low Risk  (03/30/2023)   Overall Financial Resource Strain (CARDIA)    Difficulty of Paying Living Expenses: Not hard at all  Food Insecurity: No Food Insecurity (03/30/2023)   Hunger Vital Sign    Worried About Running Out of Food in the Last Year: Never true    Ran Out of Food in the Last Year: Never true  Transportation Needs: No Transportation Needs (03/30/2023)   PRAPARE - Administrator, Civil Service (Medical): No    Lack of Transportation (Non-Medical): No  Physical Activity: Sufficiently Active (03/30/2023)   Exercise Vital Sign    Days of Exercise per Week: 4 days    Minutes of Exercise per Session: 40 min  Stress: No Stress Concern Present (03/30/2023)   [employer] of Occupational Health - Occupational Stress Questionnaire    Feeling of Stress : Not at all  Social Connections: Moderately Integrated (03/30/2023)   Social Connection and Isolation Panel [NHANES]    Frequency of Communication with Friends and Family: More than three times a week    Frequency of Social Gatherings with Friends and Family: More than three times a week  Attends Religious Services: 1 to 4 times per year    Active Member of Clubs or Organizations: Yes    Attends Engineer, structural: More than 4 times per year    Marital Status: Never married   Past Surgical History:  Procedure Laterality Date   COLONOSCOPY     COSMETIC SURGERY     OVARIAN CYST REMOVAL  1990's   UPPER GASTROINTESTINAL ENDOSCOPY     Past Surgical History:  Procedure Laterality Date   COLONOSCOPY     COSMETIC SURGERY     OVARIAN CYST REMOVAL  1990's   UPPER GASTROINTESTINAL ENDOSCOPY     Past Medical History:  Diagnosis Date   Anemia    ASTHMA 12/10/2009   Asthma    Cataract    CEREBROVASCULAR ACCIDENT, HX OF 12/10/2009   Complication of anesthesia    "just can't get me up" (06/09/2013)   Controlled type 2 diabetes mellitus without complication, without long-term current use of  insulin (HCC)    no meds   Embolic stroke of right basal ganglia (HCC) 06/11/2015   with L residual weakness/hemiparesis    GERD (gastroesophageal reflux disease)    HYPERLIPIDEMIA 12/10/2009   HYPERTENSION 12/10/2009   PFO (patent foramen ovale)    Stroke (HCC) 2008   denies residual on 06/09/2013   Tendonitis of wrist, right    BP 135/77   Pulse 88   Ht 5\' 6"  (1.676 m)   Wt 198 lb 6.4 oz (90 kg)   SpO2 96%   BMI 32.02 kg/m   Opioid Risk Score:   Fall Risk Score:  `1  Depression screen [facility] 2/9     10/13/2023   11:15 AM 08/25/2023   12:34 PM 06/30/2023    1:34 PM 04/21/2023    1:23 PM 03/30/2023    8:15 AM 02/24/2023    1:18 PM 01/02/2023    1:17 PM  Depression screen PHQ 2/9  Decreased Interest 0 0 0 0 3 0 0  Down, Depressed, Hopeless 0 0 0 0 0 0 0  PHQ - 2 Score 0 0 0 0 3 0 0  Altered sleeping    0 2    Tired, decreased energy    0 2    Change in appetite    0 3    Feeling bad or failure about yourself     0 0    Trouble concentrating    0 0    Moving slowly or fidgety/restless    0 0    Suicidal thoughts    0 0    PHQ-9 Score    0 10    Difficult doing work/chores    Not difficult at all Not difficult at all       Review of Systems  All other systems reviewed and are negative.      Objective:   Physical Exam  General no acute distress Mood and affect appropriate Extremities without edema Motor strength is 4-3 left deltoid, bicep, tricep, finger flexors and extensors Pronation is incomplete approximately 75% Left lower extremity 4/5 hip flexors are 5/5 knee extensor 2 - ankle dorsiflexor Ambulates with left AFO she is able to achieve heel strike no evidence of foot inversion With the shoe sock and AFO off she does not have excessive clawing of the feet but mild inversion of the foot with standing on the left side.      Assessment & Plan:  1.  Chronic left spastic hemiplegia doing well with  the current dosing of botulinum toxin will repeat in 6 weeks Have  encouraged patient to continue with chair yoga as well as her other home exercise program.

## 2023-10-13 NOTE — Patient Instructions (Signed)
 Continue same Botox dose

## 2023-11-20 ENCOUNTER — Ambulatory Visit: Payer: Medicare Other | Admitting: Nurse Practitioner

## 2023-11-24 ENCOUNTER — Encounter: Payer: Self-pay | Admitting: Physical Medicine & Rehabilitation

## 2023-11-24 ENCOUNTER — Encounter: Attending: Physical Medicine & Rehabilitation | Admitting: Physical Medicine & Rehabilitation

## 2023-11-24 VITALS — BP 154/88 | HR 80 | Ht 66.0 in | Wt 200.0 lb

## 2023-11-24 DIAGNOSIS — G8110 Spastic hemiplegia affecting unspecified side: Secondary | ICD-10-CM | POA: Insufficient documentation

## 2023-11-24 MED ORDER — SODIUM CHLORIDE (PF) 0.9 % IJ SOLN
6.0000 mL | Freq: Once | INTRAMUSCULAR | Status: AC
Start: 2023-11-24 — End: 2023-11-24
  Administered 2023-11-24: 6 mL via INTRAVENOUS

## 2023-11-24 MED ORDER — ONABOTULINUMTOXINA 100 UNITS IJ SOLR
100.0000 [IU] | Freq: Once | INTRAMUSCULAR | Status: AC
Start: 2023-11-24 — End: 2023-11-24
  Administered 2023-11-24: 100 [IU] via INTRAMUSCULAR

## 2023-11-24 NOTE — Patient Instructions (Signed)

## 2023-11-24 NOTE — Progress Notes (Signed)
 Botox Injection for spasticity using needle EMG guidance  Dilution: 50 Units/ml Indication: Severe spasticity which interferes with ADL,mobility and/or  hygiene and is unresponsive to medication management and other conservative care Informed consent was obtained after describing risks and benefits of the procedure with the patient. This includes bleeding, bruising, infection, excessive weakness, or medication side effects. A REMS form is on file and signed. Needle: 25g 2" needle electrode Number of units per muscle   LUE L Pronator teres 50U   LLE FDL 75U FHL 75U  Post tib 75U  EHL 25U added  All injections were done after obtaining appropriate EMG activity and after negative drawback for blood. The patient tolerated the procedure well. Post procedure instructions were given. A followup appointment was made.

## 2023-11-27 ENCOUNTER — Other Ambulatory Visit: Payer: Self-pay | Admitting: Internal Medicine

## 2023-11-30 DIAGNOSIS — E119 Type 2 diabetes mellitus without complications: Secondary | ICD-10-CM | POA: Diagnosis not present

## 2023-11-30 DIAGNOSIS — H524 Presbyopia: Secondary | ICD-10-CM | POA: Diagnosis not present

## 2023-12-12 ENCOUNTER — Other Ambulatory Visit: Payer: Self-pay | Admitting: Internal Medicine

## 2023-12-12 DIAGNOSIS — E119 Type 2 diabetes mellitus without complications: Secondary | ICD-10-CM

## 2023-12-14 ENCOUNTER — Other Ambulatory Visit: Payer: Self-pay | Admitting: Internal Medicine

## 2023-12-14 DIAGNOSIS — E119 Type 2 diabetes mellitus without complications: Secondary | ICD-10-CM

## 2023-12-21 ENCOUNTER — Other Ambulatory Visit: Payer: Self-pay | Admitting: Internal Medicine

## 2023-12-21 DIAGNOSIS — I63511 Cerebral infarction due to unspecified occlusion or stenosis of right middle cerebral artery: Secondary | ICD-10-CM

## 2023-12-29 ENCOUNTER — Other Ambulatory Visit: Payer: Self-pay | Admitting: Internal Medicine

## 2023-12-29 DIAGNOSIS — I10 Essential (primary) hypertension: Secondary | ICD-10-CM

## 2024-01-05 ENCOUNTER — Encounter: Payer: Self-pay | Admitting: Internal Medicine

## 2024-01-05 ENCOUNTER — Ambulatory Visit (INDEPENDENT_AMBULATORY_CARE_PROVIDER_SITE_OTHER): Payer: Medicare Other | Admitting: Internal Medicine

## 2024-01-05 VITALS — BP 130/70 | HR 80 | Temp 97.8°F | Wt 201.5 lb

## 2024-01-05 DIAGNOSIS — E039 Hypothyroidism, unspecified: Secondary | ICD-10-CM | POA: Diagnosis not present

## 2024-01-05 DIAGNOSIS — R300 Dysuria: Secondary | ICD-10-CM | POA: Diagnosis not present

## 2024-01-05 DIAGNOSIS — N3000 Acute cystitis without hematuria: Secondary | ICD-10-CM | POA: Diagnosis not present

## 2024-01-05 DIAGNOSIS — Z8679 Personal history of other diseases of the circulatory system: Secondary | ICD-10-CM

## 2024-01-05 DIAGNOSIS — E1149 Type 2 diabetes mellitus with other diabetic neurological complication: Secondary | ICD-10-CM

## 2024-01-05 DIAGNOSIS — I10 Essential (primary) hypertension: Secondary | ICD-10-CM

## 2024-01-05 DIAGNOSIS — G8194 Hemiplegia, unspecified affecting left nondominant side: Secondary | ICD-10-CM

## 2024-01-05 DIAGNOSIS — I63511 Cerebral infarction due to unspecified occlusion or stenosis of right middle cerebral artery: Secondary | ICD-10-CM | POA: Diagnosis not present

## 2024-01-05 LAB — POC URINALSYSI DIPSTICK (AUTOMATED)
Bilirubin, UA: NEGATIVE
Blood, UA: NEGATIVE
Glucose, UA: NEGATIVE
Ketones, UA: NEGATIVE
Nitrite, UA: NEGATIVE
Protein, UA: NEGATIVE
Spec Grav, UA: 1.015 (ref 1.010–1.025)
Urobilinogen, UA: 0.2 U/dL
pH, UA: 6 (ref 5.0–8.0)

## 2024-01-05 LAB — POCT GLYCOSYLATED HEMOGLOBIN (HGB A1C): Hemoglobin A1C: 6.6 % — AB (ref 4.0–5.6)

## 2024-01-05 LAB — URINALYSIS, ROUTINE W REFLEX MICROSCOPIC
Bilirubin Urine: NEGATIVE
Hgb urine dipstick: NEGATIVE
Ketones, ur: NEGATIVE
Nitrite: NEGATIVE
Specific Gravity, Urine: 1.01 (ref 1.000–1.030)
Total Protein, Urine: NEGATIVE
Urine Glucose: NEGATIVE
Urobilinogen, UA: 0.2 (ref 0.0–1.0)
pH: 6 (ref 5.0–8.0)

## 2024-01-05 MED ORDER — SULFAMETHOXAZOLE-TRIMETHOPRIM 800-160 MG PO TABS
1.0000 | ORAL_TABLET | Freq: Two times a day (BID) | ORAL | 0 refills | Status: AC
Start: 2024-01-05 — End: 2024-01-10

## 2024-01-05 NOTE — Progress Notes (Signed)
 Established Patient Office Visit     CC/Reason for Visit: Follow-up chronic conditions  HPI: Rebecca Yoder is a 58 y.o. female who is coming in today for the above mentioned reasons. Past Medical History is significant for: Hypertension, hyperlipidemia, type 2 diabetes, asthma, GERD, history of right MCA CVA with left hemiparesis.  Has been having some dysuria and urinary frequency for the past 4 days.   Past Medical/Surgical History: Past Medical History:  Diagnosis Date   Anemia    ASTHMA 12/10/2009   Asthma    Cataract    CEREBROVASCULAR ACCIDENT, HX OF 12/10/2009   Complication of anesthesia    "just can't get me up" (06/09/2013)   Controlled type 2 diabetes mellitus without complication, without long-term current use of insulin (HCC)    no meds   Embolic stroke of right basal ganglia (HCC) 06/11/2015   with L residual weakness/hemiparesis    GERD (gastroesophageal reflux disease)    HYPERLIPIDEMIA 12/10/2009   HYPERTENSION 12/10/2009   PFO (patent foramen ovale)    Stroke (HCC) 2008   denies residual on 06/09/2013   Tendonitis of wrist, right     Past Surgical History:  Procedure Laterality Date   COLONOSCOPY     COSMETIC SURGERY     OVARIAN CYST REMOVAL  1990's   UPPER GASTROINTESTINAL ENDOSCOPY      Social History:  reports that she has never smoked. She has never used smokeless tobacco. She reports that she does not drink alcohol and does not use drugs.  Allergies: Allergies  Allergen Reactions   Bactrim  [Sulfamethoxazole -Trimethoprim ] Nausea Only    Nausea    Penicillins Rash    Family History:  Family History  Problem Relation Age of Onset   Multiple myeloma Mother    Breast cancer Mother    Diabetes Mother    Diabetes Father    Breast cancer Maternal Aunt    Stomach cancer Maternal Aunt    Liver cancer Maternal Aunt    Prostate cancer Maternal Uncle    Colon cancer Neg Hx    Esophageal cancer Neg Hx    Rectal cancer Neg Hx       Current Outpatient Medications:    acetaminophen  (TYLENOL ) 325 MG tablet, Take 2 tablets (650 mg total) by mouth every 6 (six) hours as needed., Disp: 20 tablet, Rfl: 0   albuterol  (VENTOLIN  HFA) 108 (90 Base) MCG/ACT inhaler, INHALE 2 PUFFS BY MOUTH EVERY 6 HOURS AS NEEDED, Disp: 9 g, Rfl: 0   amLODipine  (NORVASC ) 10 MG tablet, Take 1 tablet by mouth once daily, Disp: 90 tablet, Rfl: 0   atorvastatin  (LIPITOR) 40 MG tablet, Take 1 tablet by mouth once daily, Disp: 90 tablet, Rfl: 1   carvedilol  (COREG ) 3.125 MG tablet, TAKE 1 TABLET BY MOUTH TWICE DAILY WITH A MEAL, Disp: 180 tablet, Rfl: 0   clopidogrel  (PLAVIX ) 75 MG tablet, Take 1 tablet by mouth once daily, Disp: 90 tablet, Rfl: 0   COMIRNATY syringe, , Disp: , Rfl:    diphenhydrAMINE (BENADRYL) 25 MG tablet, Take 50 mg by mouth at bedtime as needed. , Disp: , Rfl:    fluticasone  (FLOVENT  HFA) 110 MCG/ACT inhaler, INHALE 2 PUFFS IN THE MORNING AND AT BEDTIME, Disp: 12 g, Rfl: 0   Fluticasone  Furoate (ARNUITY ELLIPTA ) 100 MCG/ACT AEPB, Inhale 1 puff by mouth once daily, Disp: 30 each, Rfl: 2   hydrALAZINE  (APRESOLINE ) 10 MG tablet, Take by mouth., Disp: , Rfl:    IVERMECTIN PO,  Take by mouth., Disp: , Rfl:    levothyroxine  (SYNTHROID ) 25 MCG tablet, Take 1 tablet by mouth once daily, Disp: 90 tablet, Rfl: 0   losartan  (COZAAR ) 100 MG tablet, Take by mouth., Disp: , Rfl:    losartan  (COZAAR ) 50 MG tablet, Take 1 tablet by mouth twice daily, Disp: 180 tablet, Rfl: 0   meloxicam  (MOBIC ) 15 MG tablet, Take 1 tablet (15 mg total) by mouth daily., Disp: 30 tablet, Rfl: 0   Multiple Vitamin (MULTIVITAMIN WITH MINERALS) TABS tablet, Take 1 tablet by mouth daily., Disp: , Rfl:    pantoprazole  (PROTONIX ) 40 MG tablet, Take 1 tablet (40 mg total) by mouth daily as needed., Disp: 90 tablet, Rfl: 1   polyethylene glycol (MIRALAX  / GLYCOLAX ) packet, Take 17 g by mouth daily. (Patient taking differently: Take 17 g by mouth daily as needed.), Disp: 30  each, Rfl: 1   pravastatin  (PRAVACHOL ) 20 MG tablet, Take by mouth., Disp: , Rfl:    scopolamine  (TRANSDERM-SCOP) 1 MG/3DAYS, Place 1 patch (1.5 mg total) onto the skin every 3 (three) days., Disp: 10 patch, Rfl: 12   sulfamethoxazole -trimethoprim  (BACTRIM  DS) 800-160 MG tablet, Take 1 tablet by mouth 2 (two) times daily for 5 days., Disp: 10 tablet, Rfl: 0   traMADol  (ULTRAM ) 50 MG tablet, Take 1 tablet (50 mg total) by mouth every 6 (six) hours as needed., Disp: 20 tablet, Rfl: 0  Review of Systems:  Negative unless indicated in HPI.   Physical Exam: Vitals:   01/05/24 1415  BP: 130/70  Pulse: 80  Temp: 97.8 F (36.6 C)  TempSrc: Oral  SpO2: 98%  Weight: 201 lb 8 oz (91.4 kg)    Body mass index is 32.52 kg/m.   Physical Exam Vitals reviewed.  Constitutional:      Appearance: Normal appearance.  HENT:     Head: Normocephalic and atraumatic.  Eyes:     Conjunctiva/sclera: Conjunctivae normal.  Cardiovascular:     Rate and Rhythm: Normal rate and regular rhythm.  Pulmonary:     Effort: Pulmonary effort is normal.     Breath sounds: Normal breath sounds.  Skin:    General: Skin is warm and dry.  Neurological:     Mental Status: She is alert and oriented to person, place, and time.  Psychiatric:        Mood and Affect: Mood normal.        Behavior: Behavior normal.        Thought Content: Thought content normal.        Judgment: Judgment normal.      Impression and Plan:  Controlled type 2 diabetes mellitus with other neurologic complication, without long-term current use of insulin (HCC) -     POCT glycosylated hemoglobin (Hb A1C)  Dysuria -     POCT Urinalysis Dipstick (Automated) -     Urine Culture; Future -     Urinalysis; Future  Acquired hypothyroidism  Essential hypertension  History of cardiovascular disorder  Left hemiparesis (HCC)  Right middle cerebral artery stroke (HCC)  Acute cystitis without hematuria -      Sulfamethoxazole -Trimethoprim ; Take 1 tablet by mouth 2 (two) times daily for 5 days.  Dispense: 10 tablet; Refill: 0   - In office urine dipstick is positive for leukocytes, sent for urine culture and treat with Bactrim . - Blood pressure is well-controlled on current. - LDL is at goal on pravastatin .  Asthma is well-controlled, albuterol  refill sent. - Diabetes is well-controlled with an A1c  of 6.6.  Time spent:33 minutes reviewing chart, interviewing and examining patient and formulating plan of care.     Marguerita Shih, MD [facility]

## 2024-01-06 ENCOUNTER — Ambulatory Visit: Payer: Self-pay | Admitting: Internal Medicine

## 2024-01-06 LAB — URINE CULTURE
MICRO NUMBER:: 16502765
Result:: NO GROWTH
SPECIMEN QUALITY:: ADEQUATE

## 2024-01-06 MED ORDER — METRONIDAZOLE 500 MG PO TABS
500.0000 mg | ORAL_TABLET | Freq: Two times a day (BID) | ORAL | 0 refills | Status: AC
Start: 2024-01-06 — End: 2024-01-13

## 2024-01-14 ENCOUNTER — Other Ambulatory Visit: Payer: Self-pay | Admitting: Internal Medicine

## 2024-01-14 DIAGNOSIS — E039 Hypothyroidism, unspecified: Secondary | ICD-10-CM

## 2024-01-18 NOTE — Progress Notes (Unsigned)
 Subjective:    Patient ID: Rebecca Yoder, female    DOB: April 29, 1966, 58 y.o.   MRN: [id number]  HPI 58 year old female with history of right MCA distribution CVA greater than 10 years ago and a right basal ganglia infarct November 2016.  She has chronic left spastic hemiplegia.  She has had good results with botulinum toxin injection.  She continues to work on a home exercise program. She has undergone botulinum toxin injection approximately 7 weeks ago L Pronator teres 50U   LLE FDL 75U FHL 75U  Post tib 78G  EHL 25U added  Pain Inventory Average Pain 0 Pain Right Now 0 My pain is sharp  In the last 24 hours, has pain interfered with the following? General activity 10 Relation with others 10 Enjoyment of life 10 What TIME of day is your pain at its worst? evening Sleep (in general) Fair  Pain is worse with: other Pain improves with: therapy/exercise Relief from Meds: NA  Family History  Problem Relation Age of Onset   Multiple myeloma Mother    Breast cancer Mother    Diabetes Mother    Diabetes Father    Breast cancer Maternal Aunt    Stomach cancer Maternal Aunt    Liver cancer Maternal Aunt    Prostate cancer Maternal Uncle    Colon cancer Neg Hx    Esophageal cancer Neg Hx    Rectal cancer Neg Hx    Social History   Socioeconomic History   Marital status: Single    Spouse name: Not on file   Number of children: 0   Years of education: 12th   Highest education level: Not on file  Occupational History   Occupation: budd    Associate Professor: [employer]  Tobacco Use   Smoking status: Never   Smokeless tobacco: Never  Vaping Use   Vaping status: Never Used  Substance and Sexual Activity   Alcohol use: No    Alcohol/week: 0.0 standard drinks of alcohol   Drug use: No   Sexual activity: Yes  Other Topics Concern   Not on file  Social History Narrative   Not on file   Social Drivers of Health   Financial Resource Strain: Low Risk  (03/30/2023)    Overall Financial Resource Strain (CARDIA)    Difficulty of Paying Living Expenses: Not hard at all  Food Insecurity: No Food Insecurity (03/30/2023)   Hunger Vital Sign    Worried About Running Out of Food in the Last Year: Never true    Ran Out of Food in the Last Year: Never true  Transportation Needs: No Transportation Needs (03/30/2023)   PRAPARE - Administrator, Civil Service (Medical): No    Lack of Transportation (Non-Medical): No  Physical Activity: Sufficiently Active (03/30/2023)   Exercise Vital Sign    Days of Exercise per Week: 4 days    Minutes of Exercise per Session: 40 min  Stress: No Stress Concern Present (03/30/2023)   [employer] of Occupational Health - Occupational Stress Questionnaire    Feeling of Stress : Not at all  Social Connections: Moderately Integrated (03/30/2023)   Social Connection and Isolation Panel [NHANES]    Frequency of Communication with Friends and Family: More than three times a week    Frequency of Social Gatherings with Friends and Family: More than three times a week    Attends Religious Services: 1 to 4 times per year    Active Member of [employer]  or Organizations: Yes    Attends Engineer, structural: More than 4 times per year    Marital Status: Never married   Past Surgical History:  Procedure Laterality Date   COLONOSCOPY     COSMETIC SURGERY     OVARIAN CYST REMOVAL  1990's   UPPER GASTROINTESTINAL ENDOSCOPY     Past Surgical History:  Procedure Laterality Date   COLONOSCOPY     COSMETIC SURGERY     OVARIAN CYST REMOVAL  1990's   UPPER GASTROINTESTINAL ENDOSCOPY     Past Medical History:  Diagnosis Date   Anemia    ASTHMA 12/10/2009   Asthma    Cataract    CEREBROVASCULAR ACCIDENT, HX OF 12/10/2009   Complication of anesthesia    "just can't get me up" (06/09/2013)   Controlled type 2 diabetes mellitus without complication, without long-term current use of insulin (HCC)    no meds   Embolic stroke  of right basal ganglia (HCC) 06/11/2015   with L residual weakness/hemiparesis    GERD (gastroesophageal reflux disease)    HYPERLIPIDEMIA 12/10/2009   HYPERTENSION 12/10/2009   PFO (patent foramen ovale)    Stroke (HCC) 2008   denies residual on 06/09/2013   Tendonitis of wrist, right    BP 100/66 (BP Location: Right Arm, Patient Position: Sitting, Cuff Size: Large) Comment (BP Location): per patients request due to medical conditions  Pulse 79   Ht 5\' 6"  (1.676 m)   Wt 196 lb 3.2 oz (89 kg)   SpO2 93%   BMI 31.67 kg/m   Opioid Risk Score:   Fall Risk Score:  `1  Depression screen PHQ 2/9     01/19/2024    1:15 PM 10/13/2023   11:15 AM 08/25/2023   12:34 PM 06/30/2023    1:34 PM 04/21/2023    1:23 PM 03/30/2023    8:15 AM 02/24/2023    1:18 PM  Depression screen PHQ 2/9  Decreased Interest 0 0 0 0 0 3 0  Down, Depressed, Hopeless 0 0 0 0 0 0 0  PHQ - 2 Score 0 0 0 0 0 3 0  Altered sleeping     0 2   Tired, decreased energy     0 2   Change in appetite     0 3   Feeling bad or failure about yourself      0 0   Trouble concentrating     0 0   Moving slowly or fidgety/restless     0 0   Suicidal thoughts     0 0   PHQ-9 Score     0 10   Difficult doing work/chores     Not difficult at all Not difficult at all     Review of Systems  Musculoskeletal:        Left foot pain  All other systems reviewed and are negative.      Objective:   Physical Exam Hyperactive Babinski on the left side With standing she still has some mild toe curling of the great toe as well as the smaller toes.  Minimal foot inversion. Left upper extremity has pronator spasticity MAS 2 Motor strength is 3 - in the left deltoid bicep tricep finger flexors and extensor 4/5 in the left hip flexor knee extensor trace ankle plantar flexors 0 ankle dorsiflexor.       Assessment & Plan:  1.  History of right basal ganglia infarct with chronic left spastic hemiplegia.  Overall doing well from a functional  standpoint modified independent spasticity is well-managed with botulinum toxin injection will make some minor changes to her treatment regimen EHL not functionally impairing her will not inject next time  Add Soleus 25 U otherwise same treatment

## 2024-01-19 ENCOUNTER — Encounter: Attending: Physical Medicine & Rehabilitation | Admitting: Physical Medicine & Rehabilitation

## 2024-01-19 ENCOUNTER — Encounter: Payer: Self-pay | Admitting: Physical Medicine & Rehabilitation

## 2024-01-19 VITALS — BP 100/66 | HR 79 | Ht 66.0 in | Wt 196.2 lb

## 2024-01-19 DIAGNOSIS — G8110 Spastic hemiplegia affecting unspecified side: Secondary | ICD-10-CM | POA: Insufficient documentation

## 2024-02-19 ENCOUNTER — Other Ambulatory Visit: Payer: Self-pay | Admitting: Internal Medicine

## 2024-02-19 DIAGNOSIS — I10 Essential (primary) hypertension: Secondary | ICD-10-CM

## 2024-02-27 ENCOUNTER — Other Ambulatory Visit: Payer: Self-pay | Admitting: Internal Medicine

## 2024-03-01 ENCOUNTER — Encounter: Attending: Physical Medicine & Rehabilitation | Admitting: Physical Medicine & Rehabilitation

## 2024-03-01 ENCOUNTER — Encounter: Payer: Self-pay | Admitting: Physical Medicine & Rehabilitation

## 2024-03-01 VITALS — BP 156/82 | HR 72 | Ht 66.0 in | Wt 193.6 lb

## 2024-03-01 DIAGNOSIS — G8110 Spastic hemiplegia affecting unspecified side: Secondary | ICD-10-CM | POA: Insufficient documentation

## 2024-03-01 MED ORDER — ONABOTULINUMTOXINA 100 UNITS IJ SOLR
300.0000 [IU] | Freq: Once | INTRAMUSCULAR | Status: AC
Start: 2024-03-01 — End: 2024-03-01
  Administered 2024-03-01: 300 [IU] via INTRAMUSCULAR

## 2024-03-01 MED ORDER — SODIUM CHLORIDE (PF) 0.9 % IJ SOLN
6.0000 mL | Freq: Once | INTRAMUSCULAR | Status: AC
  Administered 2024-03-01: 6 mL

## 2024-03-01 NOTE — Progress Notes (Signed)
 Botox  Injection for spasticity using needle EMG guidance  Dilution: 50 Units/ml Indication: Severe spasticity which interferes with ADL,mobility and/or  hygiene and is unresponsive to medication management and other conservative care Informed consent was obtained after describing risks and benefits of the procedure with the patient. This includes bleeding, bruising, infection, excessive weakness, or medication side effects. A REMS form is on file and signed. Needle: 25g 2 needle electrode Number of units per muscle   LUE L Pronator teres 50U   LLE FDL 75U FHL 75U  Post tib 24L  Soleus 25U added  All injections were done after obtaining appropriate EMG activity and after negative drawback for blood. The patient tolerated the procedure well. Post procedure instructions were given. A followup appointment was made.    Recommend following doses for next injection in 3 mo  LUE L Pronator teres 25U   LLE FDL 50U FHL 50U  Post tib 49L  Soleus 25U

## 2024-03-15 ENCOUNTER — Ambulatory Visit (INDEPENDENT_AMBULATORY_CARE_PROVIDER_SITE_OTHER): Admitting: Family Medicine

## 2024-03-15 ENCOUNTER — Encounter: Payer: Self-pay | Admitting: Family Medicine

## 2024-03-15 DIAGNOSIS — Z0000 Encounter for general adult medical examination without abnormal findings: Secondary | ICD-10-CM | POA: Diagnosis not present

## 2024-03-15 NOTE — Progress Notes (Signed)
 PATIENT CHECK-IN and HEALTH RISK ASSESSMENT QUESTIONNAIRE:  -completed by phone/video for upcoming Medicare Preventive Visit  Pre-Visit Check-in: 1)Vitals (height, wt, BP, etc) - record in vitals section for visit on day of visit Request home vitals (wt, BP, etc.) and enter into vitals, THEN update Vital Signs SmartPhrase below at the top of the HPI. See below.  2)Review and Update Medications, Allergies PMH, Surgeries, Social history in Epic 3)Hospitalizations in the last year with date/reason? NO   4)Review and Update Care Team (patient's specialists) in Epic 5) Complete PHQ9 in Epic  6) Complete Fall Screening in Epic 7)Review all Health Maintenance Due and order if not done.  Medicare Wellness Patient Questionnaire:  Answer theses question about your habits: How often do you have a drink containing alcohol?No  How many drinks containing alcohol do you have on a typical day when you are drinking?NA  How often do you have six or more drinks on one occasion?NA  Have you ever smoked?no Quit date if applicable? Na   How many packs a day do/did you smoke? Na  Do you use smokeless tobacco?NO  Do you use an illicit drugs?NO  On average, how many days per week do you engage in moderate to strenuous exercise (like a brisk walk)?5 days  On average, how many minutes do you engage in exercise at this level? 60 minutes - yoga, exercise bike, walks up and down hall to work on balance Are you sexually active? Yes Number of partners?One  Typical breakfast: Fruit, breakfast bowl Typical lunch:None  Typical dinner:Chicken Vegetables, fish, salads Typical snacks: Fruit, popcorn, chips - sometimes, sometimes sweets  Beverages: Water and Sweet Tea, she is trying to to wean off of soda  Answer theses question about your everyday activities: Can you perform most household chores?Yes  Are you deaf or have significant trouble hearing?Yes, right ear  Do you feel that you have a problem with memory?No   Do you feel safe at home?Yes  Last dentist visit?a year ago  8. Do you have any difficulty performing your everyday activities?no  Are you having any difficulty walking, taking medications on your own, and or difficulty managing daily home needs?yes, patient uses a cane, stroke  Do you have difficulty walking or climbing stairs?No  Do you have difficulty dressing or bathing?No  Do you have difficulty doing errands alone such as visiting a doctor's office or shopping?no  Do you currently have any difficulty preparing food and eating?No  Do you currently have any difficulty using the toilet?NO  Do you have any difficulty managing your finances?NO  Do you have any difficulties with housekeeping of managing your housekeeping?NO    Do you have Advanced Directives in place (Living Will, Healthcare Power or Attorney)? Yes    Last eye Exam and location? Feb 2025, [facility] [place] - can see    Do you currently use prescribed or non-prescribed narcotic or opioid pain medications?No   Do you have a history or close family history of breast, ovarian, tubal or peritoneal cancer or a family member with BRCA (breast cancer susceptibility 1 and 2) gene mutations? NO, Mother Bone cancer.   Nurse/Assistant Credentials/time stamp:Leah A.Wright CMA 11:10 am     ----------------------------------------------------------------------------------------------------------------------------------------------------------------------------------------------------------------------  Because this visit was a virtual/telehealth visit, some criteria may be missing or patient reported. Any vitals not documented were not able to be obtained and vitals that have been documented are patient reported.    MEDICARE ANNUAL PREVENTIVE VISIT WITH PROVIDER: (  Welcome to [employer], initial annual wellness or annual wellness exam)  Virtual Visit via Phone Note  I connected with  Donye Dauenhauer on 03/15/24 by phone and verified that I am speaking with the correct person using two identifiers. She prefers a phone visit.   Location patient: home Location provider:work or home office Persons participating in the virtual visit: patient, provider  Concerns and/or follow up today: has had some hearing issues and reports is seeing Dr. Theophilus for evaluation. Can hear but seems is reduced in one ear.    See HM section in Epic for other details of completed HM.    ROS: negative for report of fevers, unintentional weight loss, vision changes, vision loss, chest pain, sob, hemoptysis, melena, hematochezia, hematuria, falls, bleeding or bruising, thoughts of suicide or self harm, memory loss  Patient-completed extensive health risk assessment - reviewed and discussed with the patient: See Health Risk Assessment completed with patient prior to the visit either above or in recent phone note. This was reviewed in detailed with the patient today and appropriate recommendations, orders and referrals were placed as needed per Summary below and patient instructions.   Review of Medical History: -PMH, PSH, Family History and current specialty and care providers reviewed and updated and listed below   Patient Care Team: Theophilus Andrews, Tully GRADE, MD as PCP - General (Internal Medicine) Rosemarie Eather RAMAN, MD as Consulting Physician (Neurology) Carilyn Prentice BRAVO, MD as Consulting Physician (Physical Medicine and Rehabilitation)   Past Medical History:  Diagnosis Date   Anemia    ASTHMA 12/10/2009   Asthma    Cataract    CEREBROVASCULAR ACCIDENT, HX OF 12/10/2009   Complication of anesthesia    just can't get me up (06/09/2013)   Controlled type 2 diabetes mellitus without complication, without long-term current use of insulin (HCC)    no meds   Embolic stroke of right basal ganglia (HCC) 06/11/2015   with L residual weakness/hemiparesis    GERD (gastroesophageal reflux  disease)    HYPERLIPIDEMIA 12/10/2009   HYPERTENSION 12/10/2009   PFO (patent foramen ovale)    Stroke (HCC) 2008   denies residual on 06/09/2013   Tendonitis of wrist, right     Past Surgical History:  Procedure Laterality Date   COLONOSCOPY     COSMETIC SURGERY     OVARIAN CYST REMOVAL  1990's   UPPER GASTROINTESTINAL ENDOSCOPY      Social History   Socioeconomic History   Marital status: Single    Spouse name: Not on file   Number of children: 0   Years of education: 12th   Highest education level: Not on file  Occupational History   Occupation: budd    Associate Professor: [employer]  Tobacco Use   Smoking status: Never   Smokeless tobacco: Never  Vaping Use   Vaping status: Never Used  Substance and Sexual Activity   Alcohol use: No    Alcohol/week: 0.0 standard drinks of alcohol   Drug use: No   Sexual activity: Yes  Other Topics Concern   Not on file  Social History Narrative   Not on file   Social Drivers of Health   Financial Resource Strain: Low Risk  (03/30/2023)   Overall Financial Resource Strain (CARDIA)    Difficulty of Paying Living Expenses: Not hard at all  Food Insecurity: No Food Insecurity (03/30/2023)   Hunger Vital Sign    Worried About Running Out of Food in the Last Year: Never true  Ran Out of Food in the Last Year: Never true  Transportation Needs: No Transportation Needs (03/30/2023)   PRAPARE - Administrator, Civil Service (Medical): No    Lack of Transportation (Non-Medical): No  Physical Activity: Sufficiently Active (03/30/2023)   Exercise Vital Sign    Days of Exercise per Week: 4 days    Minutes of Exercise per Session: 40 min  Stress: No Stress Concern Present (03/30/2023)   [employer] of Occupational Health - Occupational Stress Questionnaire    Feeling of Stress : Not at all  Social Connections: Moderately Integrated (03/30/2023)   Social Connection and Isolation Panel    Frequency of Communication with Friends  and Family: More than three times a week    Frequency of Social Gatherings with Friends and Family: More than three times a week    Attends Religious Services: 1 to 4 times per year    Active Member of [employer] or Organizations: Yes    Attends Engineer, structural: More than 4 times per year    Marital Status: Never married  Intimate Partner Violence: Not At Risk (03/30/2023)   Humiliation, Afraid, Rape, and Kick questionnaire    Fear of Current or Ex-Partner: No    Emotionally Abused: No    Physically Abused: No    Sexually Abused: No    Family History  Problem Relation Age of Onset   Multiple myeloma Mother    Breast cancer Mother    Diabetes Mother    Diabetes Father    Breast cancer Maternal Aunt    Stomach cancer Maternal Aunt    Liver cancer Maternal Aunt    Prostate cancer Maternal Uncle    Colon cancer Neg Hx    Esophageal cancer Neg Hx    Rectal cancer Neg Hx     Current Outpatient Medications on File Prior to Visit  Medication Sig Dispense Refill   acetaminophen  (TYLENOL ) 325 MG tablet Take 2 tablets (650 mg total) by mouth every 6 (six) hours as needed. 20 tablet 0   albuterol  (VENTOLIN  HFA) 108 (90 Base) MCG/ACT inhaler INHALE 2 PUFFS BY MOUTH EVERY 6 HOURS AS NEEDED 9 g 0   amLODipine  (NORVASC ) 10 MG tablet Take 1 tablet by mouth once daily 90 tablet 0   ARNUITY ELLIPTA  100 MCG/ACT AEPB Inhale 1 puff by mouth once daily 30 each 0   atorvastatin  (LIPITOR) 40 MG tablet Take 1 tablet by mouth once daily 90 tablet 1   carvedilol  (COREG ) 3.125 MG tablet TAKE 1 TABLET BY MOUTH TWICE DAILY WITH A MEAL 180 tablet 1   clopidogrel  (PLAVIX ) 75 MG tablet Take 1 tablet by mouth once daily 90 tablet 0   COMIRNATY syringe      diphenhydrAMINE (BENADRYL) 25 MG tablet Take 50 mg by mouth at bedtime as needed.      fluticasone  (FLOVENT  HFA) 110 MCG/ACT inhaler INHALE 2 PUFFS IN THE MORNING AND AT BEDTIME 12 g 0   hydrALAZINE  (APRESOLINE ) 10 MG tablet Take by mouth.      IVERMECTIN PO Take by mouth.     levothyroxine  (SYNTHROID ) 25 MCG tablet Take 1 tablet by mouth once daily 90 tablet 1   losartan  (COZAAR ) 100 MG tablet Take by mouth.     losartan  (COZAAR ) 50 MG tablet Take 1 tablet by mouth twice daily 180 tablet 0   meloxicam  (MOBIC ) 15 MG tablet Take 1 tablet (15 mg total) by mouth daily. 30 tablet 0   Multiple Vitamin (  MULTIVITAMIN WITH MINERALS) TABS tablet Take 1 tablet by mouth daily.     pantoprazole  (PROTONIX ) 40 MG tablet Take 1 tablet (40 mg total) by mouth daily as needed. 90 tablet 1   polyethylene glycol (MIRALAX  / GLYCOLAX ) packet Take 17 g by mouth daily. (Patient taking differently: Take 17 g by mouth daily as needed.) 30 each 1   pravastatin  (PRAVACHOL ) 20 MG tablet Take by mouth.     scopolamine  (TRANSDERM-SCOP) 1 MG/3DAYS Place 1 patch (1.5 mg total) onto the skin every 3 (three) days. 10 patch 12   traMADol  (ULTRAM ) 50 MG tablet Take 1 tablet (50 mg total) by mouth every 6 (six) hours as needed. 20 tablet 0   No current facility-administered medications on file prior to visit.    Allergies  Allergen Reactions   Bactrim  [Sulfamethoxazole -Trimethoprim ] Nausea Only    Nausea    Penicillins Rash       Physical Exam Vitals requested from patient and listed below if patient had equipment and was able to obtain at home for this virtual visit: There were no vitals filed for this visit. Estimated body mass index is 31.25 kg/m as calculated from the following:   Height as of 03/01/24: 5' 6 (1.676 m).   Weight as of 03/01/24: 193 lb 9.6 oz (87.8 kg).  EKG (optional): deferred due to virtual visit  GENERAL: alert, oriented, no acute distress detected, full vision exam deferred due to pandemic and/or virtual encounter  PSYCH/NEURO: pleasant and cooperative, no obvious depression or anxiety, speech and thought processing grossly intact, Cognitive function grossly intact  Flowsheet Row Office Visit from 04/21/2023 in [facility]  PHQ-9 Total Score 0        03/01/2024   12:26 PM 01/19/2024    1:15 PM 10/13/2023   11:15 AM 08/25/2023   12:34 PM 06/30/2023    1:34 PM  Depression screen PHQ 2/9  Decreased Interest 0 0 0 0 0  Down, Depressed, Hopeless 0 0 0 0 0  PHQ - 2 Score 0 0 0 0 0       06/30/2023    1:34 PM 08/25/2023   12:34 PM 10/13/2023   11:15 AM 01/19/2024    1:15 PM 03/01/2024   12:26 PM  Fall Risk  Falls in the past year? 1 0 0 0 0  Was there an injury with Fall? 1      Fall Risk Category Calculator 2      Fall risk Follow up Falls evaluation completed         SUMMARY AND PLAN:  Medicare annual wellness visit, subsequent   Discussed applicable health maintenance/preventive health measures and advised and referred or ordered per patient preferences: -she plans to get diabetic kidney labs and foot exam at upcoming ov -she wishes to call GI about the colonoscopy - number provided -discussed vaccines due Health Maintenance  Topic Date Due   Diabetic kidney evaluation - Urine ACR  10/26/2019   FOOT EXAM  02/27/2023   Colonoscopy  07/04/2023   Diabetic kidney evaluation - eGFR measurement  03/29/2024   INFLUENZA VACCINE  04/15/2024 (Originally 03/11/2024)   Hepatitis B Vaccines (2 of 3 - 19+ 3-dose series) 03/15/2025 (Originally 02/17/2024)   HEMOGLOBIN A1C  07/07/2024   Cervical Cancer Screening (HPV/Pap Cotest)  11/17/2024   OPHTHALMOLOGY EXAM  11/29/2024   Medicare Annual Wellness (AWV)  03/15/2025   MAMMOGRAM  07/19/2025   DTaP/Tdap/Td (3 - Td or Tdap) 01/12/2033  Pneumococcal Vaccine: 19-49 Years  Completed   Pneumococcal Vaccine: 50+ Years  Completed   COVID-19 Vaccine  Completed   Hepatitis C Screening  Completed   HIV Screening  Completed   Zoster Vaccines- Shingrix  Completed   HPV VACCINES  Aged Out   Meningococcal B Vaccine  Aged Out      Education and counseling on the following was provided based on the above review of health and a plan/checklist  for the patient, along with additional information discussed, was provided for the patient in the patient instructions :  -Provided counseling and plan for difficulty hearing  - she reports she has office visit scheduled for evaluation with Dr. Theophilus -Advised and counseled on a healthy lifestyle - including the importance of a healthy diet, regular physical activity -Reviewed patient's current diet. Advised and counseled on a whole foods based healthy diet. A summary of a healthy diet was provided in the Patient Instructions.  -reviewed patient's current physical activity level and discussed exercise guidelines for adults. Congratulated on healthy habits. Further resources provided in patient instructions.  -Advise yearly dental visits at minimum and regular eye exams   Follow up: see patient instructions     Patient Instructions  I really enjoyed getting to talk with you today! I am available on Tuesdays and Thursdays for virtual visits if you have any questions or concerns, or if I can be of any further assistance.   CHECKLIST FROM ANNUAL WELLNESS VISIT:  -Follow up (please call to schedule if not scheduled after visit):   -see your doctor ASAP about the hearing issues   -yearly for annual wellness visit with primary care office  Here is a list of your preventive care/health maintenance measures and the plan for each if any are due:  PLAN For any measures below that may be due:    1. Call the Gastroenterology Office about Colon cancer screening: [phone]   2. Request diabetic foot exam and diabetic kidney exam at next office visit.    3. Can get flu and hepatitis B vaccines at the pharmacy.  Health Maintenance  Topic Date Due   Diabetic kidney evaluation - Urine ACR  10/26/2019   FOOT EXAM  02/27/2023   Colonoscopy  07/04/2023   Diabetic kidney evaluation - eGFR measurement  03/29/2024   INFLUENZA VACCINE  04/15/2024 (Originally 03/11/2024)   Hepatitis B Vaccines (2  of 3 - 19+ 3-dose series) 03/15/2025 (Originally 02/17/2024)   HEMOGLOBIN A1C  07/07/2024   Cervical Cancer Screening (HPV/Pap Cotest)  11/17/2024   OPHTHALMOLOGY EXAM  11/29/2024   Medicare Annual Wellness (AWV)  03/15/2025   MAMMOGRAM  07/19/2025   DTaP/Tdap/Td (3 - Td or Tdap) 01/12/2033   Pneumococcal Vaccine: 19-49 Years  Completed   Pneumococcal Vaccine: 50+ Years  Completed   COVID-19 Vaccine  Completed   Hepatitis C Screening  Completed   HIV Screening  Completed   Zoster Vaccines- Shingrix  Completed   HPV VACCINES  Aged Out   Meningococcal B Vaccine  Aged Out    -See a dentist at least yearly  -Get your eyes checked and then per your eye specialist's recommendations  -Other issues addressed today:   -I have included below further information regarding a healthy whole foods based diet, physical activity guidelines for adults, stress management and opportunities for social connections. I hope you find this information useful.   -----------------------------------------------------------------------------------------------------------------------------------------------------------------------------------------------------------------------------------------------------------    NUTRITION: -eat real food: lots of colorful vegetables (half the plate) and fruits -5-7  servings of vegetables and fruits per day (fresh or steamed is best), exp. 2 servings of vegetables with lunch and dinner and 2 servings of fruit per day. Berries and greens such as kale and collards are great choices.  -consume on a regular basis:  fresh fruits, fresh veggies, fish, nuts, seeds, healthy oils (such as olive oil, avocado oil), whole grains (make sure for bread/pasta/crackers/etc., that the first ingredient on label contains the word whole), legumes. -can eat small amounts of dairy and lean meat (no larger than the palm of your hand), but avoid processed meats such as ham, bacon, lunch meat,  etc. -drink water -try to avoid fast food and pre-packaged foods, processed meat, ultra processed foods/beverages (donuts, candy, etc.) -most experts advise limiting sodium to < 2300mg  per day, should limit further is any chronic conditions such as high blood pressure, heart disease, diabetes, etc. The American Heart Association advised that < 1500mg  is is ideal -try to avoid foods/beverages that contain any ingredients with names you do not recognize  -try to avoid foods/beverages  with added sugar or sweeteners/sweets  -try to avoid sweet drinks (including diet drinks): soda, juice, Gatorade, sweet tea, power drinks, diet drinks -try to avoid white rice, white bread, pasta (unless whole grain)  EXERCISE GUIDELINES FOR ADULTS: -if you wish to increase your physical activity, do so gradually and with the approval of your doctor -STOP and seek medical care immediately if you have any chest pain, chest discomfort or trouble breathing when starting or increasing exercise  -move and stretch your body, legs, feet and arms when sitting for long periods -Physical activity guidelines for optimal health in adults: -get at least 150 minutes per week of moderate exercise (can talk, but not sing); this is about 20-30 minutes of sustained activity 5-7 days per week or two 10-15 minute episodes of sustained activity 5-7 days per week -do some muscle building/resistance training/strength training at least 2 days per week  -balance exercises 3+ days per week:   Stand somewhere where you have something sturdy to hold onto if you lose balance    1) lift up on toes, then back down, start with 5x per day and work up to 20x   2) stand and lift one leg straight out to the side so that foot is a few inches of the floor, start with 5x each side and work up to 20x each side   3) stand on one foot, start with 5 seconds each side and work up to 20 seconds on each side  If you need ideas or help with getting more  active:  -Silver sneakers https://tools.silversneakers.com  -Walk with a Doc: http://www.duncan-williams.com/  -try to include resistance (weight lifting/strength building) and balance exercises twice per week: or the following link for ideas: http://castillo-powell.com/  BuyDucts.dk  STRESS MANAGEMENT: -can try meditating, or just sitting quietly with deep breathing while intentionally relaxing all parts of your body for 5 minutes daily -if you need further help with stress, anxiety or depression please follow up with your primary doctor or contact the wonderful folks at [facility]: [phone]  SOCIAL CONNECTIONS: -options in [place] if you wish to engage in more social and exercise related activities:  -Silver sneakers https://tools.silversneakers.com  -Walk with a Doc: http://www.duncan-williams.com/  -Check out the [facility] Active Adults 50+ section on the [place] [employer] (hiking clubs, book clubs, cards and games, chess, exercise classes, aquatic classes and much more) - see the website for details: https://www.[place].gov/departments/parks-recreation/active-adults50  -YouTube  has lots of exercise videos for different ages and abilities as well  -[facility] (a variety of indoor and outdoor inperson activities for adults). 4138715103. [address]  -Virtual Online Classes (a variety of topics): see seniorplanet.org or call [phone]  -consider volunteering at a school, hospice center, church, senior center or elsewhere            Chiquita JONELLE Cramp, DO

## 2024-03-15 NOTE — Patient Instructions (Addendum)
 I really enjoyed getting to talk with you today! I am available on Tuesdays and Thursdays for virtual visits if you have any questions or concerns, or if I can be of any further assistance.   CHECKLIST FROM ANNUAL WELLNESS VISIT:  -Follow up (please call to schedule if not scheduled after visit):   -see your doctor ASAP about the hearing issues   -yearly for annual wellness visit with primary care office  Here is a list of your preventive care/health maintenance measures and the plan for each if any are due:  PLAN For any measures below that may be due:    1. Call the Gastroenterology Office about Colon cancer screening: [phone]   2. Request diabetic foot exam and diabetic kidney exam at next office visit.    3. Can get flu and hepatitis B vaccines at the pharmacy.  Health Maintenance  Topic Date Due   Diabetic kidney evaluation - Urine ACR  10/26/2019   FOOT EXAM  02/27/2023   Colonoscopy  07/04/2023   Diabetic kidney evaluation - eGFR measurement  03/29/2024   INFLUENZA VACCINE  04/15/2024 (Originally 03/11/2024)   Hepatitis B Vaccines (2 of 3 - 19+ 3-dose series) 03/15/2025 (Originally 02/17/2024)   HEMOGLOBIN A1C  07/07/2024   Cervical Cancer Screening (HPV/Pap Cotest)  11/17/2024   OPHTHALMOLOGY EXAM  11/29/2024   Medicare Annual Wellness (AWV)  03/15/2025   MAMMOGRAM  07/19/2025   DTaP/Tdap/Td (3 - Td or Tdap) 01/12/2033   Pneumococcal Vaccine: 19-49 Years  Completed   Pneumococcal Vaccine: 50+ Years  Completed   COVID-19 Vaccine  Completed   Hepatitis C Screening  Completed   HIV Screening  Completed   Zoster Vaccines- Shingrix  Completed   HPV VACCINES  Aged Out   Meningococcal B Vaccine  Aged Out    -See a dentist at least yearly  -Get your eyes checked and then per your eye specialist's recommendations  -Other issues addressed today:   -I have included below further information regarding a healthy whole foods based diet, physical activity guidelines for  adults, stress management and opportunities for social connections. I hope you find this information useful.   -----------------------------------------------------------------------------------------------------------------------------------------------------------------------------------------------------------------------------------------------------------    NUTRITION: -eat real food: lots of colorful vegetables (half the plate) and fruits -5-7 servings of vegetables and fruits per day (fresh or steamed is best), exp. 2 servings of vegetables with lunch and dinner and 2 servings of fruit per day. Berries and greens such as kale and collards are great choices.  -consume on a regular basis:  fresh fruits, fresh veggies, fish, nuts, seeds, healthy oils (such as olive oil, avocado oil), whole grains (make sure for bread/pasta/crackers/etc., that the first ingredient on label contains the word whole), legumes. -can eat small amounts of dairy and lean meat (no larger than the palm of your hand), but avoid processed meats such as ham, bacon, lunch meat, etc. -drink water -try to avoid fast food and pre-packaged foods, processed meat, ultra processed foods/beverages (donuts, candy, etc.) -most experts advise limiting sodium to < 2300mg  per day, should limit further is any chronic conditions such as high blood pressure, heart disease, diabetes, etc. The American Heart Association advised that < 1500mg  is is ideal -try to avoid foods/beverages that contain any ingredients with names you do not recognize  -try to avoid foods/beverages  with added sugar or sweeteners/sweets  -try to avoid sweet drinks (including diet drinks): soda, juice, Gatorade, sweet tea, power drinks, diet drinks -try to avoid white rice, white bread,  pasta (unless whole grain)  EXERCISE GUIDELINES FOR ADULTS: -if you wish to increase your physical activity, do so gradually and with the approval of your doctor -STOP and seek  medical care immediately if you have any chest pain, chest discomfort or trouble breathing when starting or increasing exercise  -move and stretch your body, legs, feet and arms when sitting for long periods -Physical activity guidelines for optimal health in adults: -get at least 150 minutes per week of moderate exercise (can talk, but not sing); this is about 20-30 minutes of sustained activity 5-7 days per week or two 10-15 minute episodes of sustained activity 5-7 days per week -do some muscle building/resistance training/strength training at least 2 days per week  -balance exercises 3+ days per week:   Stand somewhere where you have something sturdy to hold onto if you lose balance    1) lift up on toes, then back down, start with 5x per day and work up to 20x   2) stand and lift one leg straight out to the side so that foot is a few inches of the floor, start with 5x each side and work up to 20x each side   3) stand on one foot, start with 5 seconds each side and work up to 20 seconds on each side  If you need ideas or help with getting more active:  -Silver sneakers https://tools.silversneakers.com  -Walk with a Doc: http://www.duncan-williams.com/  -try to include resistance (weight lifting/strength building) and balance exercises twice per week: or the following link for ideas: http://castillo-powell.com/  BuyDucts.dk  STRESS MANAGEMENT: -can try meditating, or just sitting quietly with deep breathing while intentionally relaxing all parts of your body for 5 minutes daily -if you need further help with stress, anxiety or depression please follow up with your primary doctor or contact the wonderful folks at [facility]: [phone]  SOCIAL CONNECTIONS: -options in [place] if you wish to engage in more social and exercise related activities:  -Silver  sneakers https://tools.silversneakers.com  -Walk with a Doc: http://www.duncan-williams.com/  -Check out the [facility] Active Adults 50+ section on the [place] of [employer] (hiking clubs, book clubs, cards and games, chess, exercise classes, aquatic classes and much more) - see the website for details: https://www.[place].gov/departments/parks-recreation/active-adults50  -YouTube has lots of exercise videos for different ages and abilities as well  -[facility] (a variety of indoor and outdoor inperson activities for adults). (531)[phone]. [address].  -Virtual Online Classes (a variety of topics): see seniorplanet.org or call [phone]  -consider volunteering at a school, hospice center, church, senior center or elsewhere

## 2024-03-19 ENCOUNTER — Other Ambulatory Visit: Payer: Self-pay | Admitting: Internal Medicine

## 2024-03-19 DIAGNOSIS — I63511 Cerebral infarction due to unspecified occlusion or stenosis of right middle cerebral artery: Secondary | ICD-10-CM

## 2024-03-25 ENCOUNTER — Other Ambulatory Visit: Payer: Self-pay | Admitting: Internal Medicine

## 2024-03-25 DIAGNOSIS — I10 Essential (primary) hypertension: Secondary | ICD-10-CM

## 2024-03-28 ENCOUNTER — Other Ambulatory Visit: Payer: Self-pay | Admitting: Internal Medicine

## 2024-03-30 ENCOUNTER — Encounter: Payer: Self-pay | Admitting: Internal Medicine

## 2024-03-30 ENCOUNTER — Ambulatory Visit (INDEPENDENT_AMBULATORY_CARE_PROVIDER_SITE_OTHER): Admitting: Internal Medicine

## 2024-03-30 VITALS — BP 120/80 | HR 75 | Temp 97.6°F | Ht 65.5 in | Wt 194.8 lb

## 2024-03-30 DIAGNOSIS — E1149 Type 2 diabetes mellitus with other diabetic neurological complication: Secondary | ICD-10-CM

## 2024-03-30 DIAGNOSIS — Z1211 Encounter for screening for malignant neoplasm of colon: Secondary | ICD-10-CM

## 2024-03-30 DIAGNOSIS — J4530 Mild persistent asthma, uncomplicated: Secondary | ICD-10-CM

## 2024-03-30 DIAGNOSIS — E7800 Pure hypercholesterolemia, unspecified: Secondary | ICD-10-CM

## 2024-03-30 DIAGNOSIS — Z0000 Encounter for general adult medical examination without abnormal findings: Secondary | ICD-10-CM

## 2024-03-30 DIAGNOSIS — E039 Hypothyroidism, unspecified: Secondary | ICD-10-CM | POA: Diagnosis not present

## 2024-03-30 DIAGNOSIS — I10 Essential (primary) hypertension: Secondary | ICD-10-CM

## 2024-03-30 LAB — CBC WITH DIFFERENTIAL/PLATELET
Basophils Absolute: 0 K/uL (ref 0.0–0.1)
Basophils Relative: 0.3 % (ref 0.0–3.0)
Eosinophils Absolute: 0.1 K/uL (ref 0.0–0.7)
Eosinophils Relative: 2.4 % (ref 0.0–5.0)
HCT: 37.8 % (ref 36.0–46.0)
Hemoglobin: 12.5 g/dL (ref 12.0–15.0)
Lymphocytes Relative: 46.1 % — ABNORMAL HIGH (ref 12.0–46.0)
Lymphs Abs: 2.3 K/uL (ref 0.7–4.0)
MCHC: 32.9 g/dL (ref 30.0–36.0)
MCV: 89.1 fl (ref 78.0–100.0)
Monocytes Absolute: 0.6 K/uL (ref 0.1–1.0)
Monocytes Relative: 12.2 % — ABNORMAL HIGH (ref 3.0–12.0)
Neutro Abs: 1.9 K/uL (ref 1.4–7.7)
Neutrophils Relative %: 39 % — ABNORMAL LOW (ref 43.0–77.0)
Platelets: 322 K/uL (ref 150.0–400.0)
RBC: 4.25 Mil/uL (ref 3.87–5.11)
RDW: 15.3 % (ref 11.5–15.5)
WBC: 5 K/uL (ref 4.0–10.5)

## 2024-03-30 LAB — MICROALBUMIN / CREATININE URINE RATIO
Creatinine,U: 69.7 mg/dL
Microalb Creat Ratio: 29 mg/g (ref 0.0–30.0)
Microalb, Ur: 2 mg/dL — ABNORMAL HIGH (ref 0.0–1.9)

## 2024-03-30 LAB — LIPID PANEL
Cholesterol: 112 mg/dL (ref 0–200)
HDL: 50.7 mg/dL (ref >39.00)
LDL Cholesterol: 39 mg/dL (ref 0–99)
NonHDL: 61.5
Total CHOL/HDL Ratio: 2
Triglycerides: 111 mg/dL (ref 0.0–149.0)
VLDL: 22.2 mg/dL (ref 0.0–40.0)

## 2024-03-30 LAB — COMPREHENSIVE METABOLIC PANEL WITH GFR
ALT: 23 U/L (ref 0–35)
AST: 19 U/L (ref 0–37)
Albumin: 4.3 g/dL (ref 3.5–5.2)
Alkaline Phosphatase: 70 U/L (ref 39–117)
BUN: 13 mg/dL (ref 6–23)
CO2: 28 meq/L (ref 19–32)
Calcium: 9.7 mg/dL (ref 8.4–10.5)
Chloride: 102 meq/L (ref 96–112)
Creatinine, Ser: 0.68 mg/dL (ref 0.40–1.20)
GFR: 97.59 mL/min (ref >60.00)
Glucose, Bld: 99 mg/dL (ref 70–99)
Potassium: 3.8 meq/L (ref 3.5–5.1)
Sodium: 139 meq/L (ref 135–145)
Total Bilirubin: 0.4 mg/dL (ref 0.2–1.2)
Total Protein: 7.6 g/dL (ref 6.0–8.3)

## 2024-03-30 LAB — HEMOGLOBIN A1C: Hgb A1c MFr Bld: 7 % — ABNORMAL HIGH (ref 4.6–6.5)

## 2024-03-30 LAB — VITAMIN D 25 HYDROXY (VIT D DEFICIENCY, FRACTURES): VITD: 42.49 ng/mL (ref 30.00–100.00)

## 2024-03-30 LAB — TSH: TSH: 2.44 u[IU]/mL (ref 0.35–5.50)

## 2024-03-30 LAB — VITAMIN B12: Vitamin B-12: >1500 pg/mL — ABNORMAL HIGH (ref 211–911)

## 2024-03-30 MED ORDER — ALBUTEROL SULFATE HFA 108 (90 BASE) MCG/ACT IN AERS: 2.0000 | INHALATION_SPRAY | Freq: Four times a day (QID) | RESPIRATORY_TRACT | 3 refills | Status: DC | PRN

## 2024-03-30 NOTE — Progress Notes (Signed)
 Established Patient Office Visit     CC/Reason for Visit: Annual preventive exam  HPI: Rebecca Yoder is a 58 y.o. female who is coming in today for the above mentioned reasons. Past Medical History is significant for: Hypertension, hyperlipidemia, type 2 diabetes, asthma, GERD, history of right MCA CVA with left hemiparesis.  Feels well without concerns or complaints.  Has routine eye and dental care.   Past Medical/Surgical History: Past Medical History:  Diagnosis Date   Anemia    ASTHMA 12/10/2009   Asthma    Cataract    CEREBROVASCULAR ACCIDENT, HX OF 12/10/2009   Complication of anesthesia    just can't get me up (06/09/2013)   Controlled type 2 diabetes mellitus without complication, without long-term current use of insulin (HCC)    no meds   Embolic stroke of right basal ganglia (HCC) 06/11/2015   with L residual weakness/hemiparesis    GERD (gastroesophageal reflux disease)    HYPERLIPIDEMIA 12/10/2009   HYPERTENSION 12/10/2009   PFO (patent foramen ovale)    Stroke (HCC) 2008   denies residual on 06/09/2013   Tendonitis of wrist, right     Past Surgical History:  Procedure Laterality Date   COLONOSCOPY     COSMETIC SURGERY     OVARIAN CYST REMOVAL  1990's   UPPER GASTROINTESTINAL ENDOSCOPY      Social History:  reports that she has never smoked. She has never used smokeless tobacco. She reports that she does not drink alcohol and does not use drugs.  Allergies: Allergies  Allergen Reactions   Bactrim  [Sulfamethoxazole -Trimethoprim ] Nausea Only    Nausea    Penicillins Rash    Family History:  Family History  Problem Relation Age of Onset   Multiple myeloma Mother    Breast cancer Mother    Diabetes Mother    Diabetes Father    Breast cancer Maternal Aunt    Stomach cancer Maternal Aunt    Liver cancer Maternal Aunt    Prostate cancer Maternal Uncle    Colon cancer Neg Hx    Esophageal cancer Neg Hx    Rectal cancer Neg Hx       Current Outpatient Medications:    acetaminophen  (TYLENOL ) 325 MG tablet, Take 2 tablets (650 mg total) by mouth every 6 (six) hours as needed., Disp: 20 tablet, Rfl: 0   amLODipine  (NORVASC ) 10 MG tablet, Take 1 tablet by mouth once daily, Disp: 90 tablet, Rfl: 0   atorvastatin  (LIPITOR) 40 MG tablet, Take 1 tablet by mouth once daily, Disp: 90 tablet, Rfl: 1   carvedilol  (COREG ) 3.125 MG tablet, TAKE 1 TABLET BY MOUTH TWICE DAILY WITH A MEAL, Disp: 180 tablet, Rfl: 1   clopidogrel  (PLAVIX ) 75 MG tablet, Take 1 tablet by mouth once daily, Disp: 90 tablet, Rfl: 0   COMIRNATY syringe, , Disp: , Rfl:    diphenhydrAMINE (BENADRYL) 25 MG tablet, Take 50 mg by mouth at bedtime as needed. , Disp: , Rfl:    fluticasone  (FLOVENT  HFA) 110 MCG/ACT inhaler, INHALE 2 PUFFS IN THE MORNING AND AT BEDTIME, Disp: 12 g, Rfl: 0   Fluticasone  Furoate (ARNUITY ELLIPTA ) 100 MCG/ACT AEPB, Inhale 1 puff by mouth once daily, Disp: 30 each, Rfl: 2   hydrALAZINE  (APRESOLINE ) 10 MG tablet, Take by mouth., Disp: , Rfl:    IVERMECTIN PO, Take by mouth., Disp: , Rfl:    levothyroxine  (SYNTHROID ) 25 MCG tablet, Take 1 tablet by mouth once daily, Disp: 90 tablet, Rfl:  1   losartan  (COZAAR ) 100 MG tablet, Take by mouth., Disp: , Rfl:    losartan  (COZAAR ) 50 MG tablet, Take 1 tablet by mouth twice daily, Disp: 180 tablet, Rfl: 0   meloxicam  (MOBIC ) 15 MG tablet, Take 1 tablet (15 mg total) by mouth daily., Disp: 30 tablet, Rfl: 0   Multiple Vitamin (MULTIVITAMIN WITH MINERALS) TABS tablet, Take 1 tablet by mouth daily., Disp: , Rfl:    pantoprazole  (PROTONIX ) 40 MG tablet, Take 1 tablet (40 mg total) by mouth daily as needed., Disp: 90 tablet, Rfl: 1   polyethylene glycol (MIRALAX  / GLYCOLAX ) packet, Take 17 g by mouth daily. (Patient taking differently: Take 17 g by mouth daily as needed.), Disp: 30 each, Rfl: 1   pravastatin  (PRAVACHOL ) 20 MG tablet, Take by mouth., Disp: , Rfl:    scopolamine  (TRANSDERM-SCOP) 1  MG/3DAYS, Place 1 patch (1.5 mg total) onto the skin every 3 (three) days., Disp: 10 patch, Rfl: 12   traMADol  (ULTRAM ) 50 MG tablet, Take 1 tablet (50 mg total) by mouth every 6 (six) hours as needed., Disp: 20 tablet, Rfl: 0   albuterol  (VENTOLIN  HFA) 108 (90 Base) MCG/ACT inhaler, Inhale 2 puffs into the lungs every 6 (six) hours as needed., Disp: 9 g, Rfl: 3  Review of Systems:  Negative unless indicated in HPI.   Physical Exam: Vitals:   03/30/24 0943  BP: 120/80  Pulse: 75  Temp: 97.6 F (36.4 C)  TempSrc: Oral  SpO2: 98%  Weight: 194 lb 12.8 oz (88.4 kg)  Height: 5' 5.5 (1.664 m)    Body mass index is 31.92 kg/m.   Physical Exam Vitals reviewed.  Constitutional:      General: She is not in acute distress.    Appearance: Normal appearance. She is not ill-appearing, toxic-appearing or diaphoretic.  HENT:     Head: Normocephalic.     Right Ear: Tympanic membrane, ear canal and external ear normal. There is no impacted cerumen.     Left Ear: Tympanic membrane, ear canal and external ear normal. There is no impacted cerumen.     Nose: Nose normal.     Mouth/Throat:     Mouth: Mucous membranes are moist.     Pharynx: Oropharynx is clear. No oropharyngeal exudate or posterior oropharyngeal erythema.  Eyes:     General: No scleral icterus.       Right eye: No discharge.        Left eye: No discharge.     Conjunctiva/sclera: Conjunctivae normal.     Pupils: Pupils are equal, round, and reactive to light.  Neck:     Vascular: No carotid bruit.  Cardiovascular:     Rate and Rhythm: Normal rate and regular rhythm.     Pulses: Normal pulses.     Heart sounds: Normal heart sounds.  Pulmonary:     Effort: Pulmonary effort is normal. No respiratory distress.     Breath sounds: Normal breath sounds.  Abdominal:     General: Abdomen is flat. Bowel sounds are normal.     Palpations: Abdomen is soft.  Musculoskeletal:        General: Normal range of motion.     Cervical  back: Normal range of motion.  Skin:    General: Skin is warm and dry.  Neurological:     Mental Status: She is alert and oriented to person, place, and time. Mental status is at baseline.  Psychiatric:        Mood and  Affect: Mood normal.        Behavior: Behavior normal.        Thought Content: Thought content normal.        Judgment: Judgment normal.     Impression and Plan:  Essential hypertension  Acquired hypothyroidism -     TSH; Future -     Vitamin B12; Future -     VITAMIN D  25 Hydroxy (Vit-D Deficiency, Fractures); Future  Controlled type 2 diabetes mellitus with other neurologic complication, without long-term current use of insulin (HCC) -     Hemoglobin A1c; Future -     CBC with Differential/Platelet; Future -     Comprehensive metabolic panel with GFR; Future -     Microalbumin / creatinine urine ratio; Future  Pure hypercholesterolemia -     Lipid panel; Future  Screening for colon cancer -     Ambulatory referral to Gastroenterology  Encounter for preventive health examination  Mild persistent asthma without complication  Other orders -     Albuterol  Sulfate HFA; Inhale 2 puffs into the lungs every 6 (six) hours as needed.  Dispense: 9 g; Refill: 3   -Recommend routine eye and dental care. -Healthy lifestyle discussed in detail. -Labs to be updated today. -Prostate cancer screening: Not applicable Health Maintenance  Topic Date Due   Yearly kidney health urinalysis for diabetes  10/26/2019   Colon Cancer Screening  07/04/2023   Yearly kidney function blood test for diabetes  03/29/2024   Flu Shot  04/15/2024*   Hepatitis B Vaccine (2 of 3 - 19+ 3-dose series) 03/15/2025*   Hemoglobin A1C  07/07/2024   Pap with HPV screening  11/17/2024   Eye exam for diabetics  11/29/2024   Medicare Annual Wellness Visit  03/15/2025   Complete foot exam   03/30/2025   Mammogram  07/19/2025   DTaP/Tdap/Td vaccine (3 - Td or Tdap) 01/12/2033   Pneumococcal  Vaccine for age over 71  Completed   COVID-19 Vaccine  Completed   Hepatitis C Screening  Completed   HIV Screening  Completed   Zoster (Shingles) Vaccine  Completed   HPV Vaccine  Aged Out   Meningitis B Vaccine  Aged Out  *Topic was postponed. The date shown is not the original due date.       Tully Theophilus Andrews, MD [facility]

## 2024-04-06 ENCOUNTER — Ambulatory Visit: Admitting: Internal Medicine

## 2024-04-06 ENCOUNTER — Ambulatory Visit: Payer: Self-pay | Admitting: Internal Medicine

## 2024-04-20 ENCOUNTER — Ambulatory Visit: Payer: Self-pay

## 2024-04-20 NOTE — Telephone Encounter (Signed)
 FYI Only or Action Required?: FYI only for provider.  Patient was last seen in primary care on 03/30/2024 by Theophilus Andrews, Tully GRADE, MD.  Called Nurse Triage reporting Hearing Problem.  Symptoms began several months ago.  Interventions attempted: Nothing.  Symptoms are: gradually worsening.  Triage Disposition: See PCP When Office is Open (Within 3 Days)  Patient/caregiver understands and will follow disposition?: Yes       Copied from CRM #[id number]. Topic: Clinical - Red Word Triage >> Apr 20, 2024  9:27 AM Jasmin G wrote: Red Word that prompted transfer to Nurse Triage: Pt initially called to check on the status of her request for a referral or treatment for her loss of hearing, last visit with her PCP, Ms. Theophilus was on Aug 20th and that was when this was discussed with PCP, I could not find any info to relay, pt states that her hearing has been progressively getting worse in both ears especially in the morning. Reason for Disposition  [1] Tinnitus (ringing, hissing, beating) AND [2] interferes with work, school, or sleep  Answer Assessment - Initial Assessment Questions Symptoms ongoing for months. Patient requesting a referral for symptoms. Months.Symptoms are worse in the morning. Pt states PCP did not see wax build up in ears on 03/30/24 appointment. Currently denies pain or dizziness.    1. DESCRIPTION: What type of hearing problem are you having? Describe it for me. (e.g., complete hearing loss, partial loss)     Has to use speaker phone for calls and states she can barely hear when her sister is siting next to her talking.  2. LOCATION: One or both ears? If one, ask: Which ear?     Both ears  3. SEVERITY: Can you hear anything? If Yes, ask: What can you hear? (e.g., ticking watch, whisper, talking)     Severe  4. ONSET: When did this begin? Did it start suddenly or come on gradually?     Gradual  5. PATTERN: Does this come and go, or has it been  constant since it started?     Constant  6. PAIN: Is there any pain in your ear(s)?  (Scale 0-10; or none, mild, moderate, severe)     0/10 7. CAUSE: What do you think is causing this hearing problem?     Not using proper ear buds at work where it was loud  8. OTHER SYMPTOMS: Do you have any other symptoms? (e.g., dizziness, ringing in ears)     Ringing in ears  Protocols used: Hearing Loss or Change-A-AH

## 2024-04-25 ENCOUNTER — Ambulatory Visit (INDEPENDENT_AMBULATORY_CARE_PROVIDER_SITE_OTHER): Admitting: Internal Medicine

## 2024-04-25 ENCOUNTER — Encounter: Payer: Self-pay | Admitting: Internal Medicine

## 2024-04-25 VITALS — BP 130/84 | HR 80 | Temp 97.8°F | Wt 193.3 lb

## 2024-04-25 DIAGNOSIS — Z23 Encounter for immunization: Secondary | ICD-10-CM

## 2024-04-25 DIAGNOSIS — H6123 Impacted cerumen, bilateral: Secondary | ICD-10-CM

## 2024-04-25 NOTE — Progress Notes (Signed)
 Established Patient Office Visit     CC/Reason for Visit: Hearing difficulty  HPI: Rebecca Yoder is a 58 y.o. female who is coming in today for the above mentioned reasons.  Has been present for the last 10 days.  No ear pain or discharge.   Past Medical/Surgical History: Past Medical History:  Diagnosis Date   Anemia    ASTHMA 12/10/2009   Asthma    Cataract    CEREBROVASCULAR ACCIDENT, HX OF 12/10/2009   Complication of anesthesia    just can't get me up (06/09/2013)   Controlled type 2 diabetes mellitus without complication, without long-term current use of insulin (HCC)    no meds   Embolic stroke of right basal ganglia (HCC) 06/11/2015   with L residual weakness/hemiparesis    GERD (gastroesophageal reflux disease)    HYPERLIPIDEMIA 12/10/2009   HYPERTENSION 12/10/2009   PFO (patent foramen ovale)    Stroke (HCC) 2008   denies residual on 06/09/2013   Tendonitis of wrist, right     Past Surgical History:  Procedure Laterality Date   COLONOSCOPY     COSMETIC SURGERY     OVARIAN CYST REMOVAL  1990's   UPPER GASTROINTESTINAL ENDOSCOPY      Social History:  reports that she has never smoked. She has never used smokeless tobacco. She reports that she does not drink alcohol and does not use drugs.  Allergies: Allergies  Allergen Reactions   Bactrim  [Sulfamethoxazole -Trimethoprim ] Nausea Only    Nausea    Penicillins Rash    Family History:  Family History  Problem Relation Age of Onset   Multiple myeloma Mother    Breast cancer Mother    Diabetes Mother    Diabetes Father    Breast cancer Maternal Aunt    Stomach cancer Maternal Aunt    Liver cancer Maternal Aunt    Prostate cancer Maternal Uncle    Colon cancer Neg Hx    Esophageal cancer Neg Hx    Rectal cancer Neg Hx      Current Outpatient Medications:    acetaminophen  (TYLENOL ) 325 MG tablet, Take 2 tablets (650 mg total) by mouth every 6 (six) hours as needed., Disp: 20 tablet, Rfl:  0   albuterol  (VENTOLIN  HFA) 108 (90 Base) MCG/ACT inhaler, Inhale 2 puffs into the lungs every 6 (six) hours as needed., Disp: 9 g, Rfl: 3   amLODipine  (NORVASC ) 10 MG tablet, Take 1 tablet by mouth once daily, Disp: 90 tablet, Rfl: 0   atorvastatin  (LIPITOR) 40 MG tablet, Take 1 tablet by mouth once daily, Disp: 90 tablet, Rfl: 1   carvedilol  (COREG ) 3.125 MG tablet, TAKE 1 TABLET BY MOUTH TWICE DAILY WITH A MEAL, Disp: 180 tablet, Rfl: 1   clopidogrel  (PLAVIX ) 75 MG tablet, Take 1 tablet by mouth once daily, Disp: 90 tablet, Rfl: 0   COMIRNATY syringe, , Disp: , Rfl:    diphenhydrAMINE (BENADRYL) 25 MG tablet, Take 50 mg by mouth at bedtime as needed. , Disp: , Rfl:    fluticasone  (FLOVENT  HFA) 110 MCG/ACT inhaler, INHALE 2 PUFFS IN THE MORNING AND AT BEDTIME, Disp: 12 g, Rfl: 0   Fluticasone  Furoate (ARNUITY ELLIPTA ) 100 MCG/ACT AEPB, Inhale 1 puff by mouth once daily, Disp: 30 each, Rfl: 2   hydrALAZINE  (APRESOLINE ) 10 MG tablet, Take by mouth., Disp: , Rfl:    IVERMECTIN PO, Take by mouth., Disp: , Rfl:    levothyroxine  (SYNTHROID ) 25 MCG tablet, Take 1 tablet by mouth  once daily, Disp: 90 tablet, Rfl: 1   losartan  (COZAAR ) 100 MG tablet, Take by mouth., Disp: , Rfl:    losartan  (COZAAR ) 50 MG tablet, Take 1 tablet by mouth twice daily, Disp: 180 tablet, Rfl: 0   meloxicam  (MOBIC ) 15 MG tablet, Take 1 tablet (15 mg total) by mouth daily., Disp: 30 tablet, Rfl: 0   Multiple Vitamin (MULTIVITAMIN WITH MINERALS) TABS tablet, Take 1 tablet by mouth daily., Disp: , Rfl:    pantoprazole  (PROTONIX ) 40 MG tablet, Take 1 tablet (40 mg total) by mouth daily as needed., Disp: 90 tablet, Rfl: 1   polyethylene glycol (MIRALAX  / GLYCOLAX ) packet, Take 17 g by mouth daily. (Patient taking differently: Take 17 g by mouth daily as needed.), Disp: 30 each, Rfl: 1   pravastatin  (PRAVACHOL ) 20 MG tablet, Take by mouth., Disp: , Rfl:    scopolamine  (TRANSDERM-SCOP) 1 MG/3DAYS, Place 1 patch (1.5 mg total) onto  the skin every 3 (three) days., Disp: 10 patch, Rfl: 12   traMADol  (ULTRAM ) 50 MG tablet, Take 1 tablet (50 mg total) by mouth every 6 (six) hours as needed., Disp: 20 tablet, Rfl: 0  Review of Systems:  Negative unless indicated in HPI.   Physical Exam: Vitals:   04/25/24 1025  BP: 130/84  Pulse: 80  Temp: 97.8 F (36.6 C)  TempSrc: Oral  SpO2: 94%  Weight: 193 lb 4.8 oz (87.7 kg)    Body mass index is 31.68 kg/m.   Physical Exam HENT:     Right Ear: Decreased hearing noted. There is impacted cerumen.     Left Ear: Decreased hearing noted. There is impacted cerumen.      Impression and Plan:  Bilateral impacted cerumen -     Ambulatory referral to ENT   Cerumen Desimpaction  -After patient consent was obtained, warm water was applied and gentle ear lavage performed on bilateral ears. There were no complications, unfortunately we were unable to relieve obstruction.  ENT referral placed.   Time spent:23 minutes reviewing chart, interviewing and examining patient and formulating plan of care.     Tully Theophilus Andrews, MD [facility]

## 2024-04-26 ENCOUNTER — Encounter: Payer: Self-pay | Admitting: Physical Medicine & Rehabilitation

## 2024-04-26 ENCOUNTER — Encounter: Attending: Physical Medicine & Rehabilitation | Admitting: Physical Medicine & Rehabilitation

## 2024-04-26 VITALS — BP 155/79 | HR 84 | Ht 65.5 in | Wt 193.0 lb

## 2024-04-26 DIAGNOSIS — G8110 Spastic hemiplegia affecting unspecified side: Secondary | ICD-10-CM | POA: Diagnosis not present

## 2024-04-26 NOTE — Progress Notes (Signed)
 Subjective:    Patient ID: Rebecca Yoder, female    DOB: 1965-11-23, 58 y.o.   MRN: [id number]  HPI 58 year old female with chronic left spastic hemiplegia due to right CVA.  She has done well with her last botulinum toxin injection.  She would like to discuss lower extremity strengthening.  We discussed that proximal lower extremity strengthening would be recommended, lower limb i.e. calf and ankle dorsiflexor as well as foot inversion eversion strengthening is likely not to be of much benefit due to severe tone and weakness in that area. She had no complications from last botulinum toxin injection approximately 6 weeks ago.  No recent falls. Pain Inventory Average Pain 1 Pain Right Now 0 My pain is sharp  In the last 24 hours, has pain interfered with the following? General activity 10 Relation with others 9 Enjoyment of life 10 What TIME of day is your pain at its worst? evening and night Sleep (in general) Good  Pain is worse with: . Pain improves with: therapy/exercise Relief from Meds: 10  Family History  Problem Relation Age of Onset   Multiple myeloma Mother    Breast cancer Mother    Diabetes Mother    Diabetes Father    Breast cancer Maternal Aunt    Stomach cancer Maternal Aunt    Liver cancer Maternal Aunt    Prostate cancer Maternal Uncle    Colon cancer Neg Hx    Esophageal cancer Neg Hx    Rectal cancer Neg Hx    Social History   Socioeconomic History   Marital status: Single    Spouse name: Not on file   Number of children: 0   Years of education: 12th   Highest education level: Not on file  Occupational History   Occupation: budd    Associate Professor: [employer]  Tobacco Use   Smoking status: Never   Smokeless tobacco: Never  Vaping Use   Vaping status: Never Used  Substance and Sexual Activity   Alcohol use: No    Alcohol/week: 0.0 standard drinks of alcohol   Drug use: No   Sexual activity: Yes  Other Topics Concern   Not on file   Social History Narrative   Not on file   Social Drivers of Health   Financial Resource Strain: Low Risk  (03/30/2023)   Overall Financial Resource Strain (CARDIA)    Difficulty of Paying Living Expenses: Not hard at all  Food Insecurity: No Food Insecurity (03/30/2023)   Hunger Vital Sign    Worried About Running Out of Food in the Last Year: Never true    Ran Out of Food in the Last Year: Never true  Transportation Needs: No Transportation Needs (03/30/2023)   PRAPARE - Administrator, Civil Service (Medical): No    Lack of Transportation (Non-Medical): No  Physical Activity: Sufficiently Active (03/30/2023)   Exercise Vital Sign    Days of Exercise per Week: 4 days    Minutes of Exercise per Session: 40 min  Stress: No Stress Concern Present (03/30/2023)   [employer] of Occupational Health - Occupational Stress Questionnaire    Feeling of Stress : Not at all  Social Connections: Moderately Integrated (03/30/2023)   Social Connection and Isolation Panel    Frequency of Communication with Friends and Family: More than three times a week    Frequency of Social Gatherings with Friends and Family: More than three times a week    Attends Religious Services: 1  to 4 times per year    Active Member of Clubs or Organizations: Yes    Attends Engineer, structural: More than 4 times per year    Marital Status: Never married   Past Surgical History:  Procedure Laterality Date   COLONOSCOPY     COSMETIC SURGERY     OVARIAN CYST REMOVAL  1990's   UPPER GASTROINTESTINAL ENDOSCOPY     Past Surgical History:  Procedure Laterality Date   COLONOSCOPY     COSMETIC SURGERY     OVARIAN CYST REMOVAL  1990's   UPPER GASTROINTESTINAL ENDOSCOPY     Past Medical History:  Diagnosis Date   Anemia    ASTHMA 12/10/2009   Asthma    Cataract    CEREBROVASCULAR ACCIDENT, HX OF 12/10/2009   Complication of anesthesia    just can't get me up (06/09/2013)   Controlled type 2  diabetes mellitus without complication, without long-term current use of insulin (HCC)    no meds   Embolic stroke of right basal ganglia (HCC) 06/11/2015   with L residual weakness/hemiparesis    GERD (gastroesophageal reflux disease)    HYPERLIPIDEMIA 12/10/2009   HYPERTENSION 12/10/2009   PFO (patent foramen ovale)    Stroke (HCC) 2008   denies residual on 06/09/2013   Tendonitis of wrist, right    BP (!) 155/79   Pulse 84   Ht 5' 5.5 (1.664 m)   Wt 193 lb (87.5 kg)   SpO2 98%   BMI 31.63 kg/m   Opioid Risk Score:   Fall Risk Score:  `1  Depression screen [facility] 2/9     03/01/2024   12:26 PM 01/19/2024    1:15 PM 10/13/2023   11:15 AM 08/25/2023   12:34 PM 06/30/2023    1:34 PM 04/21/2023    1:23 PM 03/30/2023    8:15 AM  Depression screen PHQ 2/9  Decreased Interest 0 0 0 0 0 0 3  Down, Depressed, Hopeless 0 0 0 0 0 0 0  PHQ - 2 Score 0 0 0 0 0 0 3  Altered sleeping      0 2  Tired, decreased energy      0 2  Change in appetite      0 3  Feeling bad or failure about yourself       0 0  Trouble concentrating      0 0  Moving slowly or fidgety/restless      0 0  Suicidal thoughts      0 0  PHQ-9 Score      0 10  Difficult doing work/chores      Not difficult at all Not difficult at all     Review of Systems  Musculoskeletal:  Positive for gait problem.  Neurological:  Positive for weakness.  All other systems reviewed and are negative.      Objective:   Physical Exam  Motor strength is 3 - in left deltoid bicep tricep finger flexors and extensors 3 - at the pronator supination  Tone finger flexor tone MAS 1 pronator tone MAS 2 elbow flexor tone MAS 1 Left lower extremity motor strength is 3 - in hip flexors 4 - knee extensor 0 ankle dorsiflexor plantar flexors as well as inverter E vertebrae Tone Hamstring tone MAS 0 Quad tone MAS 0 Foot inverted tone MAS 3 Plantar flexor tone MAS 2 Toe flexor tone MAS 1 Great toe flexor tone MAS 1  Assessment & Plan:   1.  Left spastic hemiplegia secondary to remote, recurrent right CVA.  Overall doing well functionally discussed lower extremity strengthening of proximal musculature she may use weight machine for knee extension as well as knee flexion.  Continue with Botox  injection repeat in 6 weeks.  Made the following alteration will not inject soleus will inject increase dose into the left posterior tibialis L Pronator teres 50U   LLE FDL 75U FHL 75U  Post tib 899L

## 2024-04-27 ENCOUNTER — Ambulatory Visit (INDEPENDENT_AMBULATORY_CARE_PROVIDER_SITE_OTHER): Admitting: Physician Assistant

## 2024-04-27 ENCOUNTER — Encounter (INDEPENDENT_AMBULATORY_CARE_PROVIDER_SITE_OTHER): Payer: Self-pay | Admitting: Physician Assistant

## 2024-04-27 VITALS — BP 184/91 | HR 102 | Temp 99.0°F | Ht 65.0 in | Wt 193.0 lb

## 2024-04-27 DIAGNOSIS — H6123 Impacted cerumen, bilateral: Secondary | ICD-10-CM | POA: Diagnosis not present

## 2024-04-27 NOTE — Progress Notes (Signed)
 Dear Dr. Theophilus Andrews, Here is my assessment for our mutual patient, Rebecca Yoder. Thank you for allowing me the opportunity to care for your patient. Please do not hesitate to contact me should you have any other questions. Sincerely, Chyrl Cohen PA-C  Otolaryngology Clinic Note Referring provider: Dr. Theophilus Andrews HPI:  Rebecca Yoder is a 58 y.o. female kindly referred by Dr. Theophilus Andrews   The patient is a 58 year old female seen in our office for evaluation of cerumen impaction.  The patient notes over the last several months she has had difficulty hearing out of both ears.  She saw her primary care provider who attempted irrigation, she notes this caused worsening hearing problems.  She denies any significant pain, no drainage, no infection, no trauma to the ears.  She does have a history of previous strokes.    Independent Review of Additional Tests or Records:  None   PMH/Meds/All/SocHx/FamHx/ROS:   Past Medical History:  Diagnosis Date   Anemia    ASTHMA 12/10/2009   Asthma    Cataract    CEREBROVASCULAR ACCIDENT, HX OF 12/10/2009   Complication of anesthesia    just can't get me up (06/09/2013)   Controlled type 2 diabetes mellitus without complication, without long-term current use of insulin (HCC)    no meds   Embolic stroke of right basal ganglia (HCC) 06/11/2015   with L residual weakness/hemiparesis    GERD (gastroesophageal reflux disease)    HYPERLIPIDEMIA 12/10/2009   HYPERTENSION 12/10/2009   PFO (patent foramen ovale)    Stroke (HCC) 2008   denies residual on 06/09/2013   Tendonitis of wrist, right      Past Surgical History:  Procedure Laterality Date   COLONOSCOPY     COSMETIC SURGERY     OVARIAN CYST REMOVAL  1990's   UPPER GASTROINTESTINAL ENDOSCOPY      Family History  Problem Relation Age of Onset   Multiple myeloma Mother    Breast cancer Mother    Diabetes Mother    Diabetes Father    Breast cancer Maternal Aunt    Stomach  cancer Maternal Aunt    Liver cancer Maternal Aunt    Prostate cancer Maternal Uncle    Colon cancer Neg Hx    Esophageal cancer Neg Hx    Rectal cancer Neg Hx      Social Connections: Moderately Integrated (03/30/2023)   Social Connection and Isolation Panel    Frequency of Communication with Friends and Family: More than three times a week    Frequency of Social Gatherings with Friends and Family: More than three times a week    Attends Religious Services: 1 to 4 times per year    Active Member of [employer] or Organizations: Yes    Attends Engineer, structural: More than 4 times per year    Marital Status: Never married      Current Outpatient Medications:    acetaminophen  (TYLENOL ) 325 MG tablet, Take 2 tablets (650 mg total) by mouth every 6 (six) hours as needed., Disp: 20 tablet, Rfl: 0   albuterol  (VENTOLIN  HFA) 108 (90 Base) MCG/ACT inhaler, Inhale 2 puffs into the lungs every 6 (six) hours as needed., Disp: 9 g, Rfl: 3   amLODipine  (NORVASC ) 10 MG tablet, Take 1 tablet by mouth once daily, Disp: 90 tablet, Rfl: 0   atorvastatin  (LIPITOR) 40 MG tablet, Take 1 tablet by mouth once daily, Disp: 90 tablet, Rfl: 1   carvedilol  (COREG ) 3.125 MG tablet, TAKE  1 TABLET BY MOUTH TWICE DAILY WITH A MEAL, Disp: 180 tablet, Rfl: 1   clopidogrel  (PLAVIX ) 75 MG tablet, Take 1 tablet by mouth once daily, Disp: 90 tablet, Rfl: 0   COMIRNATY syringe, , Disp: , Rfl:    diphenhydrAMINE (BENADRYL) 25 MG tablet, Take 50 mg by mouth at bedtime as needed. , Disp: , Rfl:    fluticasone  (FLOVENT  HFA) 110 MCG/ACT inhaler, INHALE 2 PUFFS IN THE MORNING AND AT BEDTIME, Disp: 12 g, Rfl: 0   Fluticasone  Furoate (ARNUITY ELLIPTA ) 100 MCG/ACT AEPB, Inhale 1 puff by mouth once daily, Disp: 30 each, Rfl: 2   hydrALAZINE  (APRESOLINE ) 10 MG tablet, Take by mouth., Disp: , Rfl:    IVERMECTIN PO, Take by mouth., Disp: , Rfl:    levothyroxine  (SYNTHROID ) 25 MCG tablet, Take 1 tablet by mouth once daily, Disp:  90 tablet, Rfl: 1   losartan  (COZAAR ) 100 MG tablet, Take by mouth., Disp: , Rfl:    losartan  (COZAAR ) 50 MG tablet, Take 1 tablet by mouth twice daily, Disp: 180 tablet, Rfl: 0   meloxicam  (MOBIC ) 15 MG tablet, Take 1 tablet (15 mg total) by mouth daily., Disp: 30 tablet, Rfl: 0   Multiple Vitamin (MULTIVITAMIN WITH MINERALS) TABS tablet, Take 1 tablet by mouth daily., Disp: , Rfl:    pantoprazole  (PROTONIX ) 40 MG tablet, Take 1 tablet (40 mg total) by mouth daily as needed., Disp: 90 tablet, Rfl: 1   polyethylene glycol (MIRALAX  / GLYCOLAX ) packet, Take 17 g by mouth daily. (Patient taking differently: Take 17 g by mouth daily as needed.), Disp: 30 each, Rfl: 1   pravastatin  (PRAVACHOL ) 20 MG tablet, Take by mouth., Disp: , Rfl:    scopolamine  (TRANSDERM-SCOP) 1 MG/3DAYS, Place 1 patch (1.5 mg total) onto the skin every 3 (three) days., Disp: 10 patch, Rfl: 12   traMADol  (ULTRAM ) 50 MG tablet, Take 1 tablet (50 mg total) by mouth every 6 (six) hours as needed., Disp: 20 tablet, Rfl: 0   Physical Exam:   BP (!) 184/91 Comment: first attempt 184/91 during second attempt PT said the cuff was getting to tight and asked me to remove it.  Pulse (!) 102   Temp 99 F (37.2 C)   Ht 5' 5 (1.651 m)   Wt 193 lb (87.5 kg)   SpO2 94%   BMI 32.12 kg/m   Pertinent Findings  CN II-XII intact Bilateral cerumen impaction Weber 512: equal Rinne 512: AC > BC b/l  Anterior rhinoscopy: Septum midline; bilateral inferior turbinates with no hypertrophy No lesions of oral cavity/oropharynx; dentition within normal limits No obviously palpable neck masses/lymphadenopathy/thyromegaly No respiratory distress or stridor  Seprately Identifiable Procedures:   Procedure: Bilateral ear microscopy and cerumen removal using microscope (CPT [phone]) - Mod 50 Pre-procedure diagnosis: bilateral cerumen impaction external auditory canals Post-procedure diagnosis: same Indication: bilateral cerumen impaction; given  patient's otologic complaints and history as well as for improved and comprehensive examination of external ear and tympanic membrane, bilateral otologic examination using microscope was performed and impacted cerumen removed  Procedure: Patient was placed semi-recumbent. Both ear canals were examined using the microscope with findings above. Cerumen removed from bilateral external auditory canals using suction and currette with improvement in EAC examination and patency. Left: EAC was patent. TM was intact . Middle ear was aerated. Drainage: none Right: EAC was patent. TM was intact . Middle ear was aerated . Drainage: none Patient tolerated the procedure well.   Impression & Plans:  Rebecca Yoder is  a 58 y.o. female with the following   Cerumen impaction-  The patient presented today with cerumen impaction.  This was removed without difficulty.  I see no signs of infection.  The patient will reach out to the office if she develops any new or worsening signs or symptoms.  She does not feel she has any significant change to her baseline hearing and did not elect for audiology evaluation today.    - f/u 6 months    Thank you for allowing me the opportunity to care for your patient. Please do not hesitate to contact me should you have any other questions.  Sincerely, Chyrl Cohen PA-C [facility] Phone: [phone] Fax: [phone]  04/27/2024, 2:17 PM

## 2024-05-09 ENCOUNTER — Ambulatory Visit: Admitting: Internal Medicine

## 2024-05-20 ENCOUNTER — Other Ambulatory Visit: Payer: Self-pay | Admitting: Internal Medicine

## 2024-05-20 DIAGNOSIS — I10 Essential (primary) hypertension: Secondary | ICD-10-CM

## 2024-06-01 NOTE — Progress Notes (Signed)
 Rebecca Yoder                                          MRN: [id number]   06/01/2024   The [facility] Quality Team Specialist reviewed this patient medical record for the purposes of chart review for care gap closure. The following were reviewed: abstraction for care gap closure-glycemic status assessment and kidney health evaluation for diabetes:eGFR  and uACR.    [facility] Quality Team

## 2024-06-07 ENCOUNTER — Encounter: Attending: Physical Medicine & Rehabilitation | Admitting: Physical Medicine & Rehabilitation

## 2024-06-07 ENCOUNTER — Encounter: Payer: Self-pay | Admitting: Physical Medicine & Rehabilitation

## 2024-06-07 VITALS — BP 139/85 | HR 93 | Ht 65.0 in | Wt 192.0 lb

## 2024-06-07 DIAGNOSIS — G8110 Spastic hemiplegia affecting unspecified side: Secondary | ICD-10-CM | POA: Diagnosis present

## 2024-06-07 MED ORDER — ONABOTULINUMTOXINA 100 UNITS IJ SOLR
300.0000 [IU] | Freq: Once | INTRAMUSCULAR | Status: AC
  Administered 2024-06-07: 300 [IU] via INTRAMUSCULAR

## 2024-06-07 MED ORDER — SODIUM CHLORIDE (PF) 0.9 % IJ SOLN
6.0000 mL | Freq: Once | INTRAMUSCULAR | Status: AC
  Administered 2024-06-07: 6 mL via INTRAVENOUS

## 2024-06-07 NOTE — Progress Notes (Signed)
 Botox  Injection for spasticity using needle EMG guidance  Dilution: 50 Units/ml Indication: Severe spasticity which interferes with ADL,mobility and/or  hygiene and is unresponsive to medication management and other conservative care Informed consent was obtained after describing risks and benefits of the procedure with the patient. This includes bleeding, bruising, infection, excessive weakness, or medication side effects. A REMS form is on file and signed. Needle: 25g 2 needle electrode Number of units per muscle   LUE L Pronator teres 50U   LLE FDL 75U FHL 75U  Post tib 24L  Soleus 25U added  All injections were done after obtaining appropriate EMG activity and after negative drawback for blood. The patient tolerated the procedure well. Post procedure instructions were given. A followup appointment was made.    Recommend following doses for next injection in 3 mo  LUE L Pronator teres 25U   LLE FDL 75U FHL 50U  Post tib 24L  Soleus 75U

## 2024-06-07 NOTE — Patient Instructions (Signed)

## 2024-06-24 ENCOUNTER — Other Ambulatory Visit: Payer: Self-pay | Admitting: Internal Medicine

## 2024-06-24 DIAGNOSIS — I63511 Cerebral infarction due to unspecified occlusion or stenosis of right middle cerebral artery: Secondary | ICD-10-CM

## 2024-06-24 DIAGNOSIS — I10 Essential (primary) hypertension: Secondary | ICD-10-CM

## 2024-06-26 ENCOUNTER — Other Ambulatory Visit: Payer: Self-pay | Admitting: Internal Medicine

## 2024-06-30 ENCOUNTER — Encounter: Payer: Self-pay | Admitting: Internal Medicine

## 2024-06-30 ENCOUNTER — Ambulatory Visit (INDEPENDENT_AMBULATORY_CARE_PROVIDER_SITE_OTHER): Admitting: Internal Medicine

## 2024-06-30 VITALS — BP 128/78 | HR 79 | Temp 98.2°F | Wt 196.3 lb

## 2024-06-30 DIAGNOSIS — E039 Hypothyroidism, unspecified: Secondary | ICD-10-CM

## 2024-06-30 DIAGNOSIS — E119 Type 2 diabetes mellitus without complications: Secondary | ICD-10-CM | POA: Diagnosis not present

## 2024-06-30 DIAGNOSIS — E7800 Pure hypercholesterolemia, unspecified: Secondary | ICD-10-CM

## 2024-06-30 DIAGNOSIS — E1149 Type 2 diabetes mellitus with other diabetic neurological complication: Secondary | ICD-10-CM

## 2024-06-30 DIAGNOSIS — I10 Essential (primary) hypertension: Secondary | ICD-10-CM | POA: Diagnosis not present

## 2024-06-30 DIAGNOSIS — Z8673 Personal history of transient ischemic attack (TIA), and cerebral infarction without residual deficits: Secondary | ICD-10-CM

## 2024-06-30 DIAGNOSIS — J4530 Mild persistent asthma, uncomplicated: Secondary | ICD-10-CM

## 2024-06-30 LAB — POCT GLYCOSYLATED HEMOGLOBIN (HGB A1C): Hemoglobin A1C: 6.5 % — AB (ref 4.0–5.6)

## 2024-06-30 NOTE — Assessment & Plan Note (Signed)
 Well-controlled on atorvastatin  40 mg with a recent LDL of 39.

## 2024-06-30 NOTE — Assessment & Plan Note (Signed)
 Well-controlled on current.

## 2024-06-30 NOTE — Assessment & Plan Note (Signed)
 Well-controlled on Arnuity Ellipta  and as needed albuterol .  No recent flareups.

## 2024-06-30 NOTE — Patient Instructions (Signed)
-  Nice seeing you today!!  -See you back in 3 months. 

## 2024-06-30 NOTE — Assessment & Plan Note (Signed)
 Recent TSH within normal limits on levothyroxine  25 mcg daily.

## 2024-06-30 NOTE — Progress Notes (Signed)
 Established Patient Office Visit     CC/Reason for Visit: Follow-up chronic conditions  HPI: Rebecca Yoder is a 58 y.o. female who is coming in today for the above mentioned reasons. Past Medical History is significant for: Hypertension, hyperlipidemia, type 2 diabetes, GERD, asthma, history of right MCA CVA with left-sided hemiparesis.  Feeling well, no acute concerns or complaints.   Past Medical/Surgical History: Past Medical History:  Diagnosis Date   Anemia    ASTHMA 12/10/2009   Asthma    Cataract    CEREBROVASCULAR ACCIDENT, HX OF 12/10/2009   Complication of anesthesia    just can't get me up (06/09/2013)   Controlled type 2 diabetes mellitus without complication, without long-term current use of insulin (HCC)    no meds   Embolic stroke of right basal ganglia (HCC) 06/11/2015   with L residual weakness/hemiparesis    GERD (gastroesophageal reflux disease)    HYPERLIPIDEMIA 12/10/2009   HYPERTENSION 12/10/2009   PFO (patent foramen ovale)    Stroke (HCC) 2008   denies residual on 06/09/2013   Tendonitis of wrist, right     Past Surgical History:  Procedure Laterality Date   COLONOSCOPY     COSMETIC SURGERY     OVARIAN CYST REMOVAL  1990's   UPPER GASTROINTESTINAL ENDOSCOPY      Social History:  reports that she has never smoked. She has never used smokeless tobacco. She reports that she does not drink alcohol and does not use drugs.  Allergies: Allergies  Allergen Reactions   Bactrim  [Sulfamethoxazole -Trimethoprim ] Nausea Only    Nausea    Penicillins Rash    Family History:  Family History  Problem Relation Age of Onset   Multiple myeloma Mother    Breast cancer Mother    Diabetes Mother    Diabetes Father    Breast cancer Maternal Aunt    Stomach cancer Maternal Aunt    Liver cancer Maternal Aunt    Prostate cancer Maternal Uncle    Colon cancer Neg Hx    Esophageal cancer Neg Hx    Rectal cancer Neg Hx      Current Outpatient  Medications:    acetaminophen  (TYLENOL ) 325 MG tablet, Take 2 tablets (650 mg total) by mouth every 6 (six) hours as needed., Disp: 20 tablet, Rfl: 0   albuterol  (VENTOLIN  HFA) 108 (90 Base) MCG/ACT inhaler, Inhale 2 puffs into the lungs every 6 (six) hours as needed., Disp: 9 g, Rfl: 3   amLODipine  (NORVASC ) 10 MG tablet, Take 1 tablet by mouth once daily, Disp: 90 tablet, Rfl: 1   atorvastatin  (LIPITOR) 40 MG tablet, Take 1 tablet by mouth once daily, Disp: 90 tablet, Rfl: 1   carvedilol  (COREG ) 3.125 MG tablet, TAKE 1 TABLET BY MOUTH TWICE DAILY WITH A MEAL, Disp: 180 tablet, Rfl: 1   clopidogrel  (PLAVIX ) 75 MG tablet, Take 1 tablet by mouth once daily, Disp: 90 tablet, Rfl: 1   COMIRNATY syringe, , Disp: , Rfl:    diphenhydrAMINE (BENADRYL) 25 MG tablet, Take 50 mg by mouth at bedtime as needed. , Disp: , Rfl:    fluticasone  (FLOVENT  HFA) 110 MCG/ACT inhaler, INHALE 2 PUFFS IN THE MORNING AND AT BEDTIME, Disp: 12 g, Rfl: 0   Fluticasone  Furoate (ARNUITY ELLIPTA ) 100 MCG/ACT AEPB, Inhale 1 puff by mouth once daily, Disp: 30 each, Rfl: 2   hydrALAZINE  (APRESOLINE ) 10 MG tablet, Take by mouth., Disp: , Rfl:    IVERMECTIN PO, Take by mouth., Disp: ,  Rfl:    levothyroxine  (SYNTHROID ) 25 MCG tablet, Take 1 tablet by mouth once daily, Disp: 90 tablet, Rfl: 1   losartan  (COZAAR ) 100 MG tablet, Take by mouth., Disp: , Rfl:    losartan  (COZAAR ) 50 MG tablet, Take 1 tablet by mouth twice daily, Disp: 180 tablet, Rfl: 0   meloxicam  (MOBIC ) 15 MG tablet, Take 1 tablet (15 mg total) by mouth daily., Disp: 30 tablet, Rfl: 0   Multiple Vitamin (MULTIVITAMIN WITH MINERALS) TABS tablet, Take 1 tablet by mouth daily., Disp: , Rfl:    pantoprazole  (PROTONIX ) 40 MG tablet, Take 1 tablet (40 mg total) by mouth daily as needed., Disp: 90 tablet, Rfl: 1   polyethylene glycol (MIRALAX  / GLYCOLAX ) packet, Take 17 g by mouth daily. (Patient taking differently: Take 17 g by mouth daily as needed.), Disp: 30 each, Rfl:  1   pravastatin  (PRAVACHOL ) 20 MG tablet, Take by mouth., Disp: , Rfl:    scopolamine  (TRANSDERM-SCOP) 1 MG/3DAYS, Place 1 patch (1.5 mg total) onto the skin every 3 (three) days., Disp: 10 patch, Rfl: 12   traMADol  (ULTRAM ) 50 MG tablet, Take 1 tablet (50 mg total) by mouth every 6 (six) hours as needed., Disp: 20 tablet, Rfl: 0  Review of Systems:  Negative unless indicated in HPI.   Physical Exam: Vitals:   06/30/24 1018 06/30/24 1025  BP: 130/80 128/78  Pulse: 79   Temp: 98.2 F (36.8 C)   TempSrc: Oral   SpO2: 98%   Weight: 196 lb 4.8 oz (89 kg)     Body mass index is 32.67 kg/m.   Physical Exam Vitals reviewed.  Constitutional:      Appearance: Normal appearance.  HENT:     Head: Normocephalic and atraumatic.  Eyes:     Conjunctiva/sclera: Conjunctivae normal.  Cardiovascular:     Rate and Rhythm: Normal rate and regular rhythm.  Pulmonary:     Effort: Pulmonary effort is normal.     Breath sounds: Normal breath sounds.  Skin:    General: Skin is warm and dry.  Neurological:     Mental Status: She is alert and oriented to person, place, and time.  Psychiatric:        Mood and Affect: Mood normal.        Behavior: Behavior normal.        Thought Content: Thought content normal.        Judgment: Judgment normal.      Impression and Plan:  Controlled type 2 diabetes mellitus without complication, without long-term current use of insulin (HCC) Assessment & Plan: Well-controlled on current with an A1c of 6.5.  Orders: -     POCT glycosylated hemoglobin (Hb A1C)  Pure hypercholesterolemia Assessment & Plan: Well-controlled on atorvastatin  40 mg with a recent LDL of 39.   Essential hypertension Assessment & Plan: Well-controlled on current.    Acquired hypothyroidism Assessment & Plan: Recent TSH within normal limits on levothyroxine  25 mcg daily.   History of CVA (cerebrovascular accident)  Type 2 diabetes mellitus with other neurologic  complication, without long-term current use of insulin (HCC) Assessment & Plan: Well-controlled on current with an A1c of 6.5.   Mild persistent asthma without complication Assessment & Plan: Well-controlled on Arnuity Ellipta  and as needed albuterol .  No recent flareups.      Time spent:32 minutes reviewing chart, interviewing and examining patient and formulating plan of care.     Tully Theophilus Andrews, MD  Primary [facility] at [facility]

## 2024-06-30 NOTE — Assessment & Plan Note (Signed)
Well-controlled on current with an A1c of 6.5.

## 2024-07-01 ENCOUNTER — Other Ambulatory Visit: Payer: Self-pay | Admitting: Internal Medicine

## 2024-07-01 DIAGNOSIS — Z1231 Encounter for screening mammogram for malignant neoplasm of breast: Secondary | ICD-10-CM

## 2024-07-11 ENCOUNTER — Encounter: Payer: Self-pay | Admitting: Internal Medicine

## 2024-07-17 ENCOUNTER — Other Ambulatory Visit: Payer: Self-pay | Admitting: Internal Medicine

## 2024-07-17 DIAGNOSIS — E039 Hypothyroidism, unspecified: Secondary | ICD-10-CM

## 2024-07-19 ENCOUNTER — Encounter: Admitting: Physical Medicine & Rehabilitation

## 2024-07-29 ENCOUNTER — Other Ambulatory Visit: Payer: Self-pay | Admitting: Internal Medicine

## 2024-07-29 ENCOUNTER — Ambulatory Visit

## 2024-08-03 ENCOUNTER — Other Ambulatory Visit: Payer: Self-pay | Admitting: Internal Medicine

## 2024-08-12 ENCOUNTER — Ambulatory Visit
Admission: RE | Admit: 2024-08-12 | Discharge: 2024-08-12 | Disposition: A | Discharge status: Home / Self Care | Source: Ambulatory Visit | Attending: Internal Medicine | Admitting: Internal Medicine

## 2024-08-12 ENCOUNTER — Ambulatory Visit

## 2024-08-12 DIAGNOSIS — Z1231 Encounter for screening mammogram for malignant neoplasm of breast: Secondary | ICD-10-CM

## 2024-08-19 ENCOUNTER — Other Ambulatory Visit: Payer: Self-pay | Admitting: Internal Medicine

## 2024-08-19 DIAGNOSIS — I10 Essential (primary) hypertension: Secondary | ICD-10-CM

## 2024-08-26 ENCOUNTER — Other Ambulatory Visit: Payer: Self-pay | Admitting: Internal Medicine

## 2024-09-02 ENCOUNTER — Encounter: Attending: Physical Medicine & Rehabilitation | Admitting: Physical Medicine & Rehabilitation

## 2024-09-02 ENCOUNTER — Encounter: Payer: Self-pay | Admitting: Physical Medicine & Rehabilitation

## 2024-09-02 VITALS — BP 143/83 | HR 83 | Ht 65.0 in | Wt 197.0 lb

## 2024-09-02 DIAGNOSIS — G8110 Spastic hemiplegia affecting unspecified side: Secondary | ICD-10-CM | POA: Diagnosis not present

## 2024-09-02 NOTE — Progress Notes (Signed)
 "  Subjective:    Patient ID: Rebecca Yoder, female    DOB: [birth year], 59 y.o.   MRN: [id number]  HPI  Discussed the use of AI scribe software for clinical note transcription with the patient, who gave verbal consent to proceed.  History of Present Illness Rebecca Yoder is a 59 year old female with spasticity secondary to multiple sclerosis who presents for routine follow-up of spasticity management.  She maintains a structured exercise regimen, including leg presses with up to eighty pounds, abdominal exercises with sixty pounds, and walking laps around the gym. She completes five laps in approximately twenty minutes and performs three thousand to five thousand steps on a machine, taking about forty-two minutes for three thousand steps. She continues to focus on increasing left-sided strength and is able to perform squats and leg lifts without knee pain.  She describes a non-painful sensation in her left leg, characterized as dumping, which she interprets as muscle activation or increased awareness. She reports a stretching sensation in her heel during squats. She notes intermittent toe extension but otherwise reports no joint swelling or myalgia.  She is actively working to improve left hand function in daily activities, motivated by a colleague's similar efforts. She acknowledges a tendency to favor her right hand but is making deliberate efforts to incorporate her left hand, including tasks such as pulling up her sock.  She receives periodic botulinum toxin injections for spasticity, with the most recent treatment providing sustained benefit and prolonged reduction in muscle tone. She remains motivated to continue rehabilitation and is encouraged by her progress.   Pain Inventory Average Pain 0 Pain Right Now 0 My pain is sharp  In the last 24 hours, has pain interfered with the following? General activity 10 Relation with others 9 Enjoyment of life 9 What TIME of  day is your pain at its worst? night Sleep (in general) Good  Pain is worse with: . Pain improves with: therapy/exercise Relief from Meds: 10  Family History  Problem Relation Age of Onset   Multiple myeloma Mother    Breast cancer Mother    Diabetes Mother    Diabetes Father    Breast cancer Maternal Aunt    Stomach cancer Maternal Aunt    Liver cancer Maternal Aunt    Prostate cancer Maternal Uncle    Colon cancer Neg Hx    Esophageal cancer Neg Hx    Rectal cancer Neg Hx    Social History   Socioeconomic History   Marital status: Single    Spouse name: Not on file   Number of children: 0   Years of education: 12th   Highest education level: Not on file  Occupational History   Occupation: budd    Associate Professor: [employer]  Tobacco Use   Smoking status: Never   Smokeless tobacco: Never  Vaping Use   Vaping status: Never Used  Substance and Sexual Activity   Alcohol use: No    Alcohol/week: 0.0 standard drinks of alcohol   Drug use: No   Sexual activity: Yes  Other Topics Concern   Not on file  Social History Narrative   Not on file   Social Drivers of Health   Tobacco Use: Low Risk (06/30/2024)   Patient History    Smoking Tobacco Use: Never    Smokeless Tobacco Use: Never    Passive Exposure: Not on file  Financial Resource Strain: Low Risk (03/30/2023)   Overall Financial Resource Strain (CARDIA)  Difficulty of Paying Living Expenses: Not hard at all  Food Insecurity: No Food Insecurity (03/30/2023)   Hunger Vital Sign    Worried About Running Out of Food in the Last Year: Never true    Ran Out of Food in the Last Year: Never true  Transportation Needs: No Transportation Needs (03/30/2023)   PRAPARE - Administrator, Civil Service (Medical): No    Lack of Transportation (Non-Medical): No  Physical Activity: Sufficiently Active (03/30/2023)   Exercise Vital Sign    Days of Exercise per Week: 4 days    Minutes of Exercise per Session: 40  min  Stress: No Stress Concern Present (03/30/2023)   [employer] of Occupational Health - Occupational Stress Questionnaire    Feeling of Stress : Not at all  Social Connections: Moderately Integrated (03/30/2023)   Social Connection and Isolation Panel    Frequency of Communication with Friends and Family: More than three times a week    Frequency of Social Gatherings with Friends and Family: More than three times a week    Attends Religious Services: 1 to 4 times per year    Active Member of Clubs or Organizations: Yes    Attends Banker Meetings: More than 4 times per year    Marital Status: Never married  Depression (PHQ2-9): Low Risk (03/01/2024)   Depression (PHQ2-9)    PHQ-2 Score: 0  Alcohol Screen: Low Risk (03/30/2023)   Alcohol Screen    Last Alcohol Screening Score (AUDIT): 0  Housing: Low Risk (03/30/2023)   Housing    Last Housing Risk Score: 0  Utilities: Not At Risk (03/30/2023)   AHC Utilities    Threatened with loss of utilities: No  Health Literacy: Adequate Health Literacy (03/30/2023)   B1300 Health Literacy    Frequency of need for help with medical instructions: Never   Past Surgical History:  Procedure Laterality Date   COLONOSCOPY     COSMETIC SURGERY     OVARIAN CYST REMOVAL  1990's   UPPER GASTROINTESTINAL ENDOSCOPY     Past Surgical History:  Procedure Laterality Date   COLONOSCOPY     COSMETIC SURGERY     OVARIAN CYST REMOVAL  1990's   UPPER GASTROINTESTINAL ENDOSCOPY     Past Medical History:  Diagnosis Date   Anemia    ASTHMA 12/10/2009   Asthma    Cataract    CEREBROVASCULAR ACCIDENT, HX OF 12/10/2009   Complication of anesthesia    just can't get me up (06/09/2013)   Controlled type 2 diabetes mellitus without complication, without long-term current use of insulin (HCC)    no meds   Embolic stroke of right basal ganglia (HCC) 06/11/2015   with L residual weakness/hemiparesis    GERD (gastroesophageal reflux disease)     HYPERLIPIDEMIA 12/10/2009   HYPERTENSION 12/10/2009   PFO (patent foramen ovale)    Stroke (HCC) 2008   denies residual on 06/09/2013   Tendonitis of wrist, right    BP (!) 149/103   Pulse 83   Ht 5' 5 (1.651 m)   Wt 197 lb (89.4 kg)   SpO2 94%   BMI 32.78 kg/m   Opioid Risk Score:   Fall Risk Score:  `1  Depression screen PHQ 2/9     03/01/2024   12:26 PM 01/19/2024    1:15 PM 10/13/2023   11:15 AM 08/25/2023   12:34 PM 06/30/2023    1:34 PM 04/21/2023    1:23 PM 03/30/2023  8:15 AM  Depression screen PHQ 2/9  Decreased Interest 0 0 0 0 0 0 3  Down, Depressed, Hopeless 0 0 0 0 0 0 0  PHQ - 2 Score 0 0 0 0 0 0 3  Altered sleeping      0 2  Tired, decreased energy      0 2  Change in appetite      0 3  Feeling bad or failure about yourself       0 0  Trouble concentrating      0 0  Moving slowly or fidgety/restless      0 0  Suicidal thoughts      0 0  PHQ-9 Score      0  10   Difficult doing work/chores      Not difficult at all Not difficult at all     Data saved with a previous flowsheet row definition     Review of Systems  Musculoskeletal:  Positive for gait problem.       Objective:   Physical Exam  No acute distress Mood and affect appropriate Motor strength 3 - at the left deltoid bicep tricep finger flexors and extensors, motor control is fair with delayed and uncoordinated repetitive flexion of the extension of fingers.  Difficulty with finger thumb opposition. Difficulty grasping and releasing objects in a controlled fashion Left lower extremity 4 - hip flexor knee extensor and 0 ankle dorsiflexor plantar flexor inverters and everters Sensation intact to light touch bilateral upper and lower limbs Ambulates without an assistive device she does wear a left AFO which controls her foot and ankle.  She does have some mild hyperextension at the knee. Base of support is widened      Assessment & Plan:   Assessment and Plan Assessment & Plan Severe  spasticity Chronic spasticity well-controlled with botulinum toxin injections. Symptoms mild with occasional toe extension and intermittent leg sensation. Sustained effect from last injection. Muscle tone relatively loose. Patient motivated and engaged in rehabilitation. - Deferred botulinum toxin injection for one month to achieve 3.5 to 4 month interval. - Recommended increasing walking duration to 30 minutes per session. - Encouraged continuation of current strength training and exercise regimen. Repeat botulinum toxin injection next month, same dose and muscle group selection   "

## 2024-09-02 NOTE — Patient Instructions (Signed)
" °  VISIT SUMMARY: During your visit, we discussed your ongoing management of spasticity related to multiple sclerosis. You have been maintaining a structured exercise regimen and have seen positive results from your recent botulinum toxin injections.  YOUR PLAN: SEVERE SPASTICITY: Your chronic spasticity is well-controlled with botulinum toxin injections, and you have mild symptoms with occasional toe extension and intermittent leg sensations. -We will defer your next botulinum toxin injection for one month to achieve a 3.5 to 4 month interval. -Increase your walking duration to 30 minutes per session. -Continue your current strength training and exercise regimen. -We will have a follow-up appointment in one month to reassess your spasticity and plan the next injection.    Contains text generated by [employer].   "

## 2024-10-03 ENCOUNTER — Ambulatory Visit: Admitting: Internal Medicine

## 2024-10-26 ENCOUNTER — Ambulatory Visit (INDEPENDENT_AMBULATORY_CARE_PROVIDER_SITE_OTHER): Admitting: Physician Assistant

## 2024-12-30 ENCOUNTER — Encounter: Admitting: Physical Medicine & Rehabilitation
